# Patient Record
Sex: Female | Born: 1935 | Race: White | Hispanic: No | Smoking: Never smoker
Health system: Southern US, Community
[De-identification: ages and names within clinical notes are randomized; demographics above are authoritative.]

## PROBLEM LIST (undated history)

## (undated) DIAGNOSIS — D649 Anemia, unspecified: Secondary | ICD-10-CM

## (undated) DIAGNOSIS — I471 Supraventricular tachycardia, unspecified: Secondary | ICD-10-CM

## (undated) DIAGNOSIS — Z86010 Personal history of colonic polyps: Secondary | ICD-10-CM

## (undated) DIAGNOSIS — I2699 Other pulmonary embolism without acute cor pulmonale: Secondary | ICD-10-CM

## (undated) DIAGNOSIS — M1990 Unspecified osteoarthritis, unspecified site: Secondary | ICD-10-CM

## (undated) DIAGNOSIS — I272 Pulmonary hypertension, unspecified: Secondary | ICD-10-CM

## (undated) DIAGNOSIS — I50810 Right heart failure, unspecified: Secondary | ICD-10-CM

## (undated) DIAGNOSIS — I10 Essential (primary) hypertension: Secondary | ICD-10-CM

## (undated) DIAGNOSIS — M8580 Other specified disorders of bone density and structure, unspecified site: Secondary | ICD-10-CM

## (undated) DIAGNOSIS — K5790 Diverticulosis of intestine, part unspecified, without perforation or abscess without bleeding: Secondary | ICD-10-CM

## (undated) DIAGNOSIS — G4730 Sleep apnea, unspecified: Secondary | ICD-10-CM

## (undated) DIAGNOSIS — N3281 Overactive bladder: Secondary | ICD-10-CM

## (undated) DIAGNOSIS — E785 Hyperlipidemia, unspecified: Secondary | ICD-10-CM

## (undated) DIAGNOSIS — M4800 Spinal stenosis, site unspecified: Secondary | ICD-10-CM

## (undated) HISTORY — PX: COLONOSCOPY: SHX174

## (undated) HISTORY — DX: Supraventricular tachycardia: I47.1

## (undated) HISTORY — PX: INCISIONAL HERNIA REPAIR: SHX193

## (undated) HISTORY — DX: Other pulmonary embolism without acute cor pulmonale: I26.99

## (undated) HISTORY — DX: Hyperlipidemia, unspecified: E78.5

## (undated) HISTORY — DX: Essential (primary) hypertension: I10

## (undated) HISTORY — DX: Personal history of colonic polyps: Z86.010

## (undated) HISTORY — DX: Unspecified osteoarthritis, unspecified site: M19.90

## (undated) HISTORY — DX: Diverticulosis of intestine, part unspecified, without perforation or abscess without bleeding: K57.90

## (undated) HISTORY — DX: Anemia, unspecified: D64.9

## (undated) HISTORY — DX: Spinal stenosis, site unspecified: M48.00

## (undated) HISTORY — DX: Supraventricular tachycardia, unspecified: I47.10

## (undated) HISTORY — DX: Pulmonary hypertension, unspecified: I27.20

## (undated) HISTORY — DX: Right heart failure, unspecified: I50.810

## (undated) HISTORY — DX: Overactive bladder: N32.81

## (undated) HISTORY — DX: Other specified disorders of bone density and structure, unspecified site: M85.80

## (undated) HISTORY — PX: CHOLECYSTECTOMY: SHX55

## (undated) HISTORY — DX: Sleep apnea, unspecified: G47.30

## (undated) HISTORY — PX: TONSILLECTOMY AND ADENOIDECTOMY: SUR1326

---

## 1991-10-28 HISTORY — PX: BLADDER SUSPENSION: SHX72

## 1998-01-16 ENCOUNTER — Ambulatory Visit (HOSPITAL_COMMUNITY): Admission: RE | Admit: 1998-01-16 | Discharge: 1998-01-16 | Payer: Self-pay | Admitting: *Deleted

## 1998-10-27 HISTORY — PX: TOTAL KNEE ARTHROPLASTY: SHX125

## 1999-01-22 ENCOUNTER — Ambulatory Visit (HOSPITAL_COMMUNITY): Admission: RE | Admit: 1999-01-22 | Discharge: 1999-01-22 | Payer: Self-pay | Admitting: *Deleted

## 1999-04-15 ENCOUNTER — Other Ambulatory Visit: Admission: RE | Admit: 1999-04-15 | Discharge: 1999-04-15 | Payer: Self-pay | Admitting: *Deleted

## 1999-04-26 ENCOUNTER — Ambulatory Visit (HOSPITAL_COMMUNITY): Admission: RE | Admit: 1999-04-26 | Discharge: 1999-04-26 | Payer: Self-pay | Admitting: *Deleted

## 1999-10-24 ENCOUNTER — Inpatient Hospital Stay (HOSPITAL_COMMUNITY): Admission: RE | Admit: 1999-10-24 | Discharge: 1999-10-29 | Payer: Self-pay | Admitting: Orthopedic Surgery

## 1999-11-01 ENCOUNTER — Emergency Department (HOSPITAL_COMMUNITY): Admission: EM | Admit: 1999-11-01 | Discharge: 1999-11-01 | Payer: Self-pay | Admitting: *Deleted

## 2000-02-05 ENCOUNTER — Ambulatory Visit (HOSPITAL_COMMUNITY): Admission: RE | Admit: 2000-02-05 | Discharge: 2000-02-05 | Payer: Self-pay | Admitting: *Deleted

## 2000-04-09 ENCOUNTER — Other Ambulatory Visit: Admission: RE | Admit: 2000-04-09 | Discharge: 2000-04-09 | Payer: Self-pay | Admitting: Family Medicine

## 2000-08-20 ENCOUNTER — Ambulatory Visit (HOSPITAL_COMMUNITY): Admission: RE | Admit: 2000-08-20 | Discharge: 2000-08-20 | Payer: Self-pay | Admitting: Family Medicine

## 2001-02-24 ENCOUNTER — Ambulatory Visit (HOSPITAL_COMMUNITY): Admission: RE | Admit: 2001-02-24 | Discharge: 2001-02-24 | Payer: Self-pay | Admitting: Family Medicine

## 2001-02-24 ENCOUNTER — Encounter: Payer: Self-pay | Admitting: Family Medicine

## 2001-04-21 ENCOUNTER — Other Ambulatory Visit: Admission: RE | Admit: 2001-04-21 | Discharge: 2001-04-21 | Payer: Self-pay | Admitting: Family Medicine

## 2002-03-04 ENCOUNTER — Encounter: Payer: Self-pay | Admitting: Family Medicine

## 2002-03-04 ENCOUNTER — Ambulatory Visit (HOSPITAL_COMMUNITY): Admission: RE | Admit: 2002-03-04 | Discharge: 2002-03-04 | Payer: Self-pay | Admitting: Family Medicine

## 2002-05-23 ENCOUNTER — Other Ambulatory Visit: Admission: RE | Admit: 2002-05-23 | Discharge: 2002-05-23 | Payer: Self-pay | Admitting: Family Medicine

## 2003-03-28 ENCOUNTER — Ambulatory Visit (HOSPITAL_COMMUNITY): Admission: RE | Admit: 2003-03-28 | Discharge: 2003-03-28 | Payer: Self-pay | Admitting: Family Medicine

## 2003-03-28 ENCOUNTER — Encounter: Payer: Self-pay | Admitting: Family Medicine

## 2004-04-17 ENCOUNTER — Ambulatory Visit (HOSPITAL_COMMUNITY): Admission: RE | Admit: 2004-04-17 | Discharge: 2004-04-17 | Payer: Self-pay | Admitting: Family Medicine

## 2004-09-20 ENCOUNTER — Ambulatory Visit: Payer: Self-pay | Admitting: Internal Medicine

## 2004-09-23 ENCOUNTER — Ambulatory Visit: Payer: Self-pay | Admitting: Family Medicine

## 2004-10-15 ENCOUNTER — Ambulatory Visit: Payer: Self-pay | Admitting: Family Medicine

## 2005-04-18 ENCOUNTER — Ambulatory Visit (HOSPITAL_COMMUNITY): Admission: RE | Admit: 2005-04-18 | Discharge: 2005-04-18 | Payer: Self-pay | Admitting: Family Medicine

## 2005-05-27 LAB — HM DEXA SCAN

## 2005-05-28 ENCOUNTER — Ambulatory Visit: Payer: Self-pay | Admitting: Family Medicine

## 2005-05-28 ENCOUNTER — Other Ambulatory Visit: Admission: RE | Admit: 2005-05-28 | Discharge: 2005-05-28 | Payer: Self-pay | Admitting: Family Medicine

## 2005-05-28 LAB — CONVERTED CEMR LAB: Pap Smear: NORMAL

## 2005-06-04 ENCOUNTER — Ambulatory Visit: Payer: Self-pay | Admitting: Family Medicine

## 2005-07-31 ENCOUNTER — Ambulatory Visit: Payer: Self-pay | Admitting: Family Medicine

## 2005-08-14 ENCOUNTER — Ambulatory Visit: Payer: Self-pay | Admitting: Family Medicine

## 2006-02-26 ENCOUNTER — Ambulatory Visit: Payer: Self-pay | Admitting: Family Medicine

## 2006-03-04 ENCOUNTER — Ambulatory Visit: Payer: Self-pay | Admitting: Family Medicine

## 2006-03-07 ENCOUNTER — Ambulatory Visit: Payer: Self-pay | Admitting: Internal Medicine

## 2006-05-06 ENCOUNTER — Ambulatory Visit: Payer: Self-pay | Admitting: Family Medicine

## 2006-05-13 ENCOUNTER — Ambulatory Visit (HOSPITAL_COMMUNITY): Admission: RE | Admit: 2006-05-13 | Discharge: 2006-05-13 | Payer: Self-pay | Admitting: Family Medicine

## 2006-05-22 ENCOUNTER — Ambulatory Visit: Payer: Self-pay | Admitting: Family Medicine

## 2006-06-25 ENCOUNTER — Ambulatory Visit: Payer: Self-pay | Admitting: Family Medicine

## 2006-07-13 ENCOUNTER — Ambulatory Visit: Payer: Self-pay | Admitting: Family Medicine

## 2006-08-04 ENCOUNTER — Ambulatory Visit: Payer: Self-pay | Admitting: Family Medicine

## 2006-10-15 ENCOUNTER — Ambulatory Visit: Payer: Self-pay | Admitting: Family Medicine

## 2006-10-27 HISTORY — PX: CATARACT EXTRACTION: SUR2

## 2006-11-17 ENCOUNTER — Ambulatory Visit (HOSPITAL_COMMUNITY): Admission: RE | Admit: 2006-11-17 | Discharge: 2006-11-17 | Payer: Self-pay | Admitting: Orthopedic Surgery

## 2007-02-23 ENCOUNTER — Encounter: Payer: Self-pay | Admitting: Family Medicine

## 2007-02-23 DIAGNOSIS — M1990 Unspecified osteoarthritis, unspecified site: Secondary | ICD-10-CM | POA: Insufficient documentation

## 2007-02-23 DIAGNOSIS — M48061 Spinal stenosis, lumbar region without neurogenic claudication: Secondary | ICD-10-CM | POA: Insufficient documentation

## 2007-02-23 DIAGNOSIS — D72818 Other decreased white blood cell count: Secondary | ICD-10-CM | POA: Insufficient documentation

## 2007-02-23 DIAGNOSIS — Z8739 Personal history of other diseases of the musculoskeletal system and connective tissue: Secondary | ICD-10-CM

## 2007-02-23 DIAGNOSIS — I471 Supraventricular tachycardia: Secondary | ICD-10-CM

## 2007-02-23 DIAGNOSIS — M349 Systemic sclerosis, unspecified: Secondary | ICD-10-CM

## 2007-02-23 DIAGNOSIS — T7840XA Allergy, unspecified, initial encounter: Secondary | ICD-10-CM | POA: Insufficient documentation

## 2007-02-23 DIAGNOSIS — Q762 Congenital spondylolisthesis: Secondary | ICD-10-CM | POA: Insufficient documentation

## 2007-05-17 ENCOUNTER — Ambulatory Visit (HOSPITAL_COMMUNITY): Admission: RE | Admit: 2007-05-17 | Discharge: 2007-05-17 | Payer: Self-pay | Admitting: Family Medicine

## 2007-05-20 ENCOUNTER — Encounter (INDEPENDENT_AMBULATORY_CARE_PROVIDER_SITE_OTHER): Payer: Self-pay | Admitting: *Deleted

## 2007-07-09 ENCOUNTER — Ambulatory Visit: Payer: Self-pay | Admitting: Family Medicine

## 2007-07-09 DIAGNOSIS — E785 Hyperlipidemia, unspecified: Secondary | ICD-10-CM

## 2007-07-09 DIAGNOSIS — I10 Essential (primary) hypertension: Secondary | ICD-10-CM | POA: Insufficient documentation

## 2007-07-16 ENCOUNTER — Encounter (INDEPENDENT_AMBULATORY_CARE_PROVIDER_SITE_OTHER): Payer: Self-pay | Admitting: *Deleted

## 2007-07-16 ENCOUNTER — Ambulatory Visit: Payer: Self-pay | Admitting: Family Medicine

## 2007-07-16 LAB — FECAL OCCULT BLOOD, GUAIAC: Fecal Occult Blood: NEGATIVE

## 2007-07-28 ENCOUNTER — Ambulatory Visit: Payer: Self-pay | Admitting: Family Medicine

## 2007-11-01 ENCOUNTER — Ambulatory Visit: Payer: Self-pay | Admitting: Family Medicine

## 2007-11-29 ENCOUNTER — Ambulatory Visit: Payer: Self-pay | Admitting: Family Medicine

## 2007-12-08 ENCOUNTER — Telehealth: Payer: Self-pay | Admitting: Family Medicine

## 2007-12-09 ENCOUNTER — Encounter: Admission: RE | Admit: 2007-12-09 | Discharge: 2007-12-09 | Payer: Self-pay | Admitting: Family Medicine

## 2007-12-17 ENCOUNTER — Ambulatory Visit: Payer: Self-pay | Admitting: Family Medicine

## 2008-05-26 ENCOUNTER — Ambulatory Visit (HOSPITAL_COMMUNITY): Admission: RE | Admit: 2008-05-26 | Discharge: 2008-05-26 | Payer: Self-pay | Admitting: Family Medicine

## 2008-06-15 ENCOUNTER — Ambulatory Visit: Payer: Self-pay | Admitting: Family Medicine

## 2008-06-15 LAB — CONVERTED CEMR LAB
Ketones, urine, test strip: NEGATIVE
Nitrite: NEGATIVE
Protein, U semiquant: NEGATIVE
Urobilinogen, UA: 0.2

## 2008-07-14 ENCOUNTER — Encounter: Payer: Self-pay | Admitting: Family Medicine

## 2008-07-14 ENCOUNTER — Other Ambulatory Visit: Admission: RE | Admit: 2008-07-14 | Discharge: 2008-07-14 | Payer: Self-pay | Admitting: Family Medicine

## 2008-07-14 ENCOUNTER — Ambulatory Visit: Payer: Self-pay | Admitting: Family Medicine

## 2008-07-14 DIAGNOSIS — R350 Frequency of micturition: Secondary | ICD-10-CM

## 2008-07-17 LAB — CONVERTED CEMR LAB
Albumin: 3.8 g/dL (ref 3.5–5.2)
Alkaline Phosphatase: 57 units/L (ref 39–117)
BUN: 17 mg/dL (ref 6–23)
Bilirubin, Direct: 0.1 mg/dL (ref 0.0–0.3)
Chloride: 104 meq/L (ref 96–112)
Creatinine, Ser: 0.9 mg/dL (ref 0.4–1.2)
Eosinophils Relative: 3.5 % (ref 0.0–5.0)
GFR calc non Af Amer: 65 mL/min
Hemoglobin: 14.6 g/dL (ref 12.0–15.0)
LDL Cholesterol: 106 mg/dL — ABNORMAL HIGH (ref 0–99)
Lymphocytes Relative: 24.1 % (ref 12.0–46.0)
MCV: 92.5 fL (ref 78.0–100.0)
Monocytes Absolute: 0.5 10*3/uL (ref 0.1–1.0)
Monocytes Relative: 10.9 % (ref 3.0–12.0)
Neutro Abs: 2.6 10*3/uL (ref 1.4–7.7)
Phosphorus: 3.6 mg/dL (ref 2.3–4.6)
Platelets: 203 10*3/uL (ref 150–400)
Potassium: 3.8 meq/L (ref 3.5–5.1)
RDW: 12.3 % (ref 11.5–14.6)
Sodium: 143 meq/L (ref 135–145)
Total Bilirubin: 1.1 mg/dL (ref 0.3–1.2)
Total CHOL/HDL Ratio: 4.4
Total Protein: 7.4 g/dL (ref 6.0–8.3)

## 2008-07-18 ENCOUNTER — Encounter: Payer: Self-pay | Admitting: Family Medicine

## 2008-07-21 ENCOUNTER — Ambulatory Visit: Payer: Self-pay | Admitting: Family Medicine

## 2008-07-21 LAB — CONVERTED CEMR LAB
OCCULT 2: NEGATIVE
OCCULT 3: NEGATIVE

## 2008-08-30 ENCOUNTER — Ambulatory Visit: Payer: Self-pay | Admitting: Family Medicine

## 2008-09-04 ENCOUNTER — Telehealth: Payer: Self-pay | Admitting: Family Medicine

## 2008-09-08 ENCOUNTER — Telehealth: Payer: Self-pay | Admitting: Family Medicine

## 2008-09-29 ENCOUNTER — Ambulatory Visit: Payer: Self-pay | Admitting: Family Medicine

## 2008-09-29 DIAGNOSIS — R05 Cough: Secondary | ICD-10-CM

## 2009-03-16 ENCOUNTER — Ambulatory Visit: Payer: Self-pay | Admitting: Family Medicine

## 2009-03-21 ENCOUNTER — Telehealth: Payer: Self-pay | Admitting: Family Medicine

## 2009-04-02 ENCOUNTER — Telehealth: Payer: Self-pay | Admitting: Family Medicine

## 2009-04-03 ENCOUNTER — Ambulatory Visit: Payer: Self-pay | Admitting: Family Medicine

## 2009-05-28 ENCOUNTER — Ambulatory Visit (HOSPITAL_COMMUNITY): Admission: RE | Admit: 2009-05-28 | Discharge: 2009-05-28 | Payer: Self-pay | Admitting: Family Medicine

## 2009-05-30 ENCOUNTER — Encounter: Payer: Self-pay | Admitting: Family Medicine

## 2009-05-30 ENCOUNTER — Encounter (INDEPENDENT_AMBULATORY_CARE_PROVIDER_SITE_OTHER): Payer: Self-pay | Admitting: *Deleted

## 2009-07-16 ENCOUNTER — Ambulatory Visit: Payer: Self-pay | Admitting: Family Medicine

## 2009-07-17 LAB — CONVERTED CEMR LAB
ALT: 18 units/L (ref 0–35)
AST: 24 units/L (ref 0–37)
BUN: 19 mg/dL (ref 6–23)
Calcium: 9.1 mg/dL (ref 8.4–10.5)
Chloride: 103 meq/L (ref 96–112)
Creatinine, Ser: 0.9 mg/dL (ref 0.4–1.2)
Eosinophils Absolute: 0.2 10*3/uL (ref 0.0–0.7)
Glucose, Bld: 94 mg/dL (ref 70–99)
Lymphocytes Relative: 26.7 % (ref 12.0–46.0)
MCV: 92.9 fL (ref 78.0–100.0)
Monocytes Relative: 11.1 % (ref 3.0–12.0)
Neutro Abs: 2.9 10*3/uL (ref 1.4–7.7)
Neutrophils Relative %: 57.3 % (ref 43.0–77.0)
Phosphorus: 3.6 mg/dL (ref 2.3–4.6)
Platelets: 184 10*3/uL (ref 150.0–400.0)
RBC: 4.75 M/uL (ref 3.87–5.11)
RDW: 13 % (ref 11.5–14.6)
Total CHOL/HDL Ratio: 4
VLDL: 16.6 mg/dL (ref 0.0–40.0)

## 2009-07-26 ENCOUNTER — Ambulatory Visit: Payer: Self-pay | Admitting: Family Medicine

## 2009-08-15 ENCOUNTER — Ambulatory Visit: Payer: Self-pay | Admitting: Family Medicine

## 2009-08-15 DIAGNOSIS — E876 Hypokalemia: Secondary | ICD-10-CM | POA: Insufficient documentation

## 2009-08-16 ENCOUNTER — Encounter: Payer: Self-pay | Admitting: Family Medicine

## 2009-08-16 LAB — CONVERTED CEMR LAB: Potassium: 3.4 meq/L — ABNORMAL LOW (ref 3.5–5.1)

## 2009-08-31 ENCOUNTER — Ambulatory Visit: Payer: Self-pay | Admitting: Family Medicine

## 2009-09-03 LAB — CONVERTED CEMR LAB: Potassium: 3.8 meq/L (ref 3.5–5.1)

## 2009-10-23 ENCOUNTER — Ambulatory Visit: Payer: Self-pay | Admitting: Family Medicine

## 2009-10-23 LAB — CONVERTED CEMR LAB
Specific Gravity, Urine: 1.015
Urobilinogen, UA: 0.2
WBC Urine, dipstick: NEGATIVE

## 2009-10-31 ENCOUNTER — Telehealth: Payer: Self-pay | Admitting: Family Medicine

## 2009-11-02 ENCOUNTER — Encounter: Payer: Self-pay | Admitting: Family Medicine

## 2010-04-25 ENCOUNTER — Emergency Department (HOSPITAL_COMMUNITY): Admission: EM | Admit: 2010-04-25 | Discharge: 2010-04-25 | Payer: Self-pay | Admitting: Emergency Medicine

## 2010-05-29 ENCOUNTER — Ambulatory Visit (HOSPITAL_COMMUNITY): Admission: RE | Admit: 2010-05-29 | Discharge: 2010-05-29 | Payer: Self-pay | Admitting: Family Medicine

## 2010-05-29 LAB — HM MAMMOGRAPHY: HM Mammogram: NORMAL

## 2010-05-31 ENCOUNTER — Encounter: Payer: Self-pay | Admitting: Family Medicine

## 2010-07-16 ENCOUNTER — Ambulatory Visit: Payer: Self-pay | Admitting: Family Medicine

## 2010-08-12 ENCOUNTER — Ambulatory Visit: Payer: Self-pay | Admitting: Family Medicine

## 2010-08-12 ENCOUNTER — Telehealth (INDEPENDENT_AMBULATORY_CARE_PROVIDER_SITE_OTHER): Payer: Self-pay | Admitting: *Deleted

## 2010-08-12 LAB — CONVERTED CEMR LAB
ALT: 17 units/L (ref 0–35)
AST: 25 units/L (ref 0–37)
Basophils Relative: 0.7 % (ref 0.0–3.0)
CO2: 35 meq/L — ABNORMAL HIGH (ref 19–32)
Chloride: 103 meq/L (ref 96–112)
Cholesterol: 168 mg/dL (ref 0–200)
Creatinine, Ser: 0.9 mg/dL (ref 0.4–1.2)
GFR calc non Af Amer: 63.29 mL/min (ref >60)
Glucose, Bld: 96 mg/dL (ref 70–99)
HCT: 43 % (ref 36.0–46.0)
HDL: 46.4 mg/dL (ref >39.00)
Hemoglobin: 14.5 g/dL (ref 12.0–15.0)
MCHC: 33.8 g/dL (ref 30.0–36.0)
MCV: 93.9 fL (ref 78.0–100.0)
Neutro Abs: 2.1 10*3/uL (ref 1.4–7.7)
Neutrophils Relative %: 51.4 % (ref 43.0–77.0)
Platelets: 193 10*3/uL (ref 150.0–400.0)
RDW: 13.2 % (ref 11.5–14.6)
TSH: 3.35 microintl units/mL (ref 0.35–5.50)
Total Bilirubin: 0.9 mg/dL (ref 0.3–1.2)
Total CHOL/HDL Ratio: 4
Total Protein: 6.7 g/dL (ref 6.0–8.3)
Triglycerides: 107 mg/dL (ref 0.0–149.0)

## 2010-08-13 LAB — CONVERTED CEMR LAB: Vit D, 25-Hydroxy: 68 ng/mL (ref 30–89)

## 2010-08-15 ENCOUNTER — Ambulatory Visit: Payer: Self-pay | Admitting: Family Medicine

## 2010-08-15 DIAGNOSIS — M2140 Flat foot [pes planus] (acquired), unspecified foot: Secondary | ICD-10-CM | POA: Insufficient documentation

## 2010-08-19 ENCOUNTER — Telehealth: Payer: Self-pay | Admitting: Family Medicine

## 2010-08-20 ENCOUNTER — Encounter: Payer: Self-pay | Admitting: Family Medicine

## 2010-11-15 ENCOUNTER — Ambulatory Visit
Admission: RE | Admit: 2010-11-15 | Discharge: 2010-11-15 | Payer: Self-pay | Source: Home / Self Care | Attending: Family Medicine | Admitting: Family Medicine

## 2010-11-18 ENCOUNTER — Encounter: Payer: Self-pay | Admitting: Family Medicine

## 2010-11-24 LAB — CONVERTED CEMR LAB
ALT: 18 units/L (ref 0–35)
AST: 26 units/L (ref 0–37)
BUN: 16 mg/dL (ref 6–23)
Basophils Relative: 0.8 % (ref 0.0–1.0)
Calcium: 9.1 mg/dL (ref 8.4–10.5)
Chloride: 107 meq/L (ref 96–112)
Creatinine, Ser: 0.9 mg/dL (ref 0.4–1.2)
GFR calc Af Amer: 79 mL/min
GFR calc non Af Amer: 66 mL/min
Glucose, Bld: 95 mg/dL (ref 70–99)
HCT: 40.6 % (ref 36.0–46.0)
Hemoglobin: 14.2 g/dL (ref 12.0–15.0)
Lymphocytes Relative: 30.7 % (ref 12.0–46.0)
MCHC: 35 g/dL (ref 30.0–36.0)
Monocytes Relative: 12.2 % — ABNORMAL HIGH (ref 3.0–11.0)
Sodium: 145 meq/L (ref 135–145)
TSH: 3.36 microintl units/mL (ref 0.35–5.50)
Total CHOL/HDL Ratio: 5.1
Triglycerides: 151 mg/dL — ABNORMAL HIGH (ref 0–149)
VLDL: 30 mg/dL (ref 0–40)

## 2010-11-26 NOTE — Letter (Signed)
Summary: Results Follow up Letter  [place] [facility]  [address] [place], [place] [address]   Phone: [phone]  Fax: [phone]    05/31/2010 MRN: [id number]  [facility] [address] [place], [place]  [address]  Dear Ms. Busic,  The following are the results of your recent test(s):  Test         Result    Pap Smear:        Normal _____  Not Normal _____ Comments: ______________________________________________________ Cholesterol: LDL(Bad cholesterol):         Your goal is less than:         HDL (Good cholesterol):       Your goal is more than: Comments:  ______________________________________________________ Mammogram:        Normal __X___  Not Normal _____ Comments: Please repeat in one year.  ___________________________________________________________________ Hemoccult:        Normal _____  Not normal _______ Comments:    _____________________________________________________________________ Other Tests:    We routinely do not discuss normal results over the telephone.  If you desire a copy of the results, or you have any questions about this information we can discuss them at your next office visit.   Sincerely,      [name], MD

## 2010-11-26 NOTE — Letter (Signed)
Summary: [facility]   Imported By: Lanelle Bal 11/12/2009 10:59:24  _____________________________________________________________________  External Attachment:    Type:   Image     Comment:   External Document

## 2010-11-26 NOTE — Progress Notes (Signed)
----   Converted from flag ---- ---- 08/12/2010 8:44 AM, Colon Flattery Tower MD wrote: please check renal , lipid/ hepatic, cbc with diff/ tsh and vit D level for 401.1, 272 and 733.0 thanks  ---- 08/09/2010 3:20 PM, Melody Comas wrote: Patient is coming in for cpx labs on monday. What labs to draw and diagnosis please. ------------------------------

## 2010-11-26 NOTE — Progress Notes (Signed)
----   Converted from flag ---- ---- 08/07/2010 8:32 PM, Colon Flattery Tower MD wrote: please check lipid/ renal / hepatic/ tsh / cbc with diff and vit D for 272, 401.1, 733.0  ---- 08/07/2010 11:59 AM, Liane Comber CMA (AAMA) wrote: Lab orders please! Good Morning! This pt is scheduled for cpx labs Monday, which labs to draw and dx codes to use? Thanks Tasha ------------------------------

## 2010-11-26 NOTE — Progress Notes (Signed)
Summary: Pneumovax removed from 08/15/10 visit.  ---- Converted from flag ---- ---- 08/19/2010 12:11 PM, Crawford Givens McClinton wrote: [name], the pneumovax h/b removed.  Last Thursday's chrgs just showed today.  Ruby  ---- 08/15/2010 2:50 PM, [name] LPN wrote: [name], MRN# [id number] did not receive a pneumovax today. Please remove from pt's record. Please let me know when this is done and thank you very much. [name] LPN ------------------------------

## 2010-11-26 NOTE — Progress Notes (Signed)
Summary: Rx Detrol, Atenolol, Klor-Con, & HCTZ ([facility])  Phone Note Refill Request Call back at [phone] Message from:  [facility] on October 31, 2009 9:23 AM  Refills Requested: Medication #1:  DETROL LA 4 MG XR24H-CAP 1 by mouth once daily  Medication #2:  ATENOLOL 50 MG TABS Take one by mouth daily  Medication #3:  K DUR 10 MEQ One by mouth once daily  Medication #4:  HYDROCHLOROTHIAZIDE 25 MG TABS Take one by mouth daily Received fax for 90 day Rx for all medications.  Forms in your IN box   Method Requested: Fax to Mail Away Pharmacy Initial call taken by: Linde Gillis CMA Duncan Dull),  October 31, 2009 9:24 AM  Follow-up for Phone Call        printed in put in nurse in box for pickup  Follow-up by: Judith Part MD,  October 31, 2009 11:06 AM  Additional Follow-up for Phone Call Additional follow up Details #1::        Scripts faxed to [facility] Additional Follow-up by: Lowella Petties CMA,  October 31, 2009 11:24 AM    Prescriptions: K DUR 10 MEQ One by mouth once daily  #90 x 3   Entered by:   Lowella Petties CMA   Authorized by:   Judith Part MD   Signed by:   Lowella Petties CMA on 10/31/2009   Method used:   Telephoned to ...       [facility]* (mail-order)             ,          Ph: [phone]       Fax: [phone]   RxID:   [id number] DETROL LA 4 MG XR24H-CAP (TOLTERODINE TARTRATE) 1 by mouth once daily  #90 x 3   Entered by:   Lowella Petties CMA   Authorized by:   Judith Part MD   Signed by:   Lowella Petties CMA on 10/31/2009   Method used:   Electronically to        [facility]* (mail-order)             ,          Ph: [phone]       Fax: [phone]   RxID:   [id number] ATENOLOL 50 MG TABS (ATENOLOL) Take one by mouth daily  #90 x 3   Entered by:   Lowella Petties CMA   Authorized by:   Judith Part MD   Signed by:   Lowella Petties CMA on 10/31/2009   Method used:   Electronically to        [facility]*  (mail-order)             ,          Ph: [phone]       Fax: [phone]   RxID:   [id number] HYDROCHLOROTHIAZIDE 25 MG TABS (HYDROCHLOROTHIAZIDE) Take one by mouth daily  #90 x 3   Entered by:   Lowella Petties CMA   Authorized by:   Judith Part MD   Signed by:   Lowella Petties CMA on 10/31/2009   Method used:   Electronically to        [facility]* (mail-order)             ,          Ph: [phone]       Fax: [phone]   RxID:  [id number] DETROL LA 4 MG XR24H-CAP (TOLTERODINE TARTRATE) 1 by mouth once daily  #90 x 3   Entered and Authorized by:   Judith Part MD   Signed by:   Judith Part MD on 10/31/2009   Method used:   Print then Give to Patient   RxID:   [id number] K DUR 10 MEQ One by mouth once daily  #90 x 3   Entered and Authorized by:   Judith Part MD   Signed by:   Judith Part MD on 10/31/2009   Method used:   Print then Give to Patient   RxID:   [id number] HYDROCHLOROTHIAZIDE 25 MG TABS (HYDROCHLOROTHIAZIDE) Take one by mouth daily  #90 x 3   Entered and Authorized by:   Judith Part MD   Signed by:   Judith Part MD on 10/31/2009   Method used:   Print then Give to Patient   RxID:   [id number] K DUR 10 MEQ One by mouth once daily  #90 x 3   Entered and Authorized by:   Judith Part MD   Signed by:   Judith Part MD on 10/31/2009   Method used:   Historical   RxID:   [id number] ATENOLOL 50 MG TABS (ATENOLOL) Take one by mouth daily  #90 x 3   Entered and Authorized by:   Judith Part MD   Signed by:   Judith Part MD on 10/31/2009   Method used:   Print then Give to Patient   RxID:   [id number] DETROL LA 4 MG XR24H-CAP (TOLTERODINE TARTRATE) 1 by mouth once daily  #90 x 3   Entered and Authorized by:   Judith Part MD   Signed by:   Judith Part MD on 10/31/2009   Method used:   Historical   RxID:   [id number]
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
## 2010-11-26 NOTE — Assessment & Plan Note (Signed)
Summary: FLU SHOT/TOWER/JRR   Nurse Visit   Allergies: 1)  ! Penicillin G Potassium (Penicillin G Potassium) 2)  ! Sulfa 3)  ! Ace Inhibitors 4)  ! Diphtheria-Tetanus Toxoids (Diphtheria-Tetanus Toxoids Dt)  Immunizations Administered:  Influenza Vaccine # 1:    Vaccine Type: Fluvax 3+    Site: left deltoid    Mfr: GlaxoSmithKline    Dose: 0.5 ml    Route: IM    Given by: Mervin Hack CMA (AAMA)    Exp. Date: 04/21/2011    Lot #: [id number]    VIS given: 05/21/10 version given July 16, 2010.  Flu Vaccine Consent Questions:    Do you have a history of severe allergic reactions to this vaccine? no    Any prior history of allergic reactions to egg and/or gelatin? no    Do you have a sensitivity to the preservative Thimersol? no    Do you have a past history of Guillan-Barre Syndrome? no    Do you currently have an acute febrile illness? no    Have you ever had a severe reaction to latex? no    Vaccine information given and explained to patient? yes    Are you currently pregnant? no  Orders Added: 1)  Flu Vaccine 55yrs + [90658] 2)  Admin 1st Vaccine [09811]
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
## 2010-12-12 NOTE — Letter (Signed)
Summary: [facility]  [facility]   Imported By: Kassie Mends 12/02/2010 08:21:42  _____________________________________________________________________  External Attachment:    Type:   Image     Comment:   External Document

## 2011-03-14 NOTE — Procedures (Signed)
. [facility]  Patient:    [name]                         MRN: [id number] Proc. Date: 10/24/99 Adm. Date:  [id number] Attending:  Drema Pry CC:         Jearld Adjutant, M.D.             Department of Anesthesia                           Procedure Report  PROCEDURE:  SURGEON:  Edwin Cap. Zoila Shutter, M.D.  ANESTHESIA:  HISTORY:  The patient is a 77 year old white female with a diagnosis of severe degenerative joint disease, scheduled for a total knee replacement, and for whom, epidural analgesia in the postoperative period has been requested, explained, understood, and accepted in the preoperative period.  DESCRIPTION OF PROCEDURE:  At termination of surgery while still under general anesthesia, the patient was turned to the right lateral decubitus position. The back was prepped with Betadine and draped in the usual sterile fashion.  Using he loss of resistance technique, _________ tap was accomplished at the L3-4 interspace with a 17-gauge Tuohy needle.  After aspiration for blood and CSF were negative, a total of 7 cc of 1% lidocaine plus 100 mcg of fentanyl was infused with no problems.  This was followed the passage of a peridural catheter at 10 cm cephalad.  The catheter was then checked for patency and affixed to the patients back.  The patient was then returned to the supine position, awakened, and brought to he postanesthesia care unit where a peridural catheter will be connected to an infusion pump with a mixture of fentanyl 5 mcg/cc and Marcaine 1/16% at an initial rate of 10 cc/hr, to be adjusted as needed.  There were no complications.  She id well and will be followed in the usual fashion. DD:  10/24/99 TD:  10/25/99 Job: [id number]

## 2011-03-14 NOTE — Assessment & Plan Note (Signed)
[facility]                                   ON-CALL NOTE   NAME:[name], [name]                          MRN:          [id number]  DATE:05/05/2006                            DOB:          04-Mar-1934    TELEPHONE TRIAGE NOTE   Time received is 9:19 p.m.  The caller is her daughter.  She sees Dr. Milinda Antis.  Date of birth is 12-30-1933.  Telephone [phone].  The patient just  got back yesterday from a trip to the [place] and did well;  however, earlier today she has developed diffuse muscle aches, a headache  and fever.  There are no other symptoms except for a mild dry cough.  There  is no sore throat, no nausea, vomiting or diarrhea.  She is taking plenty of  fluids.  The patient took one Aleve tablet, 2-1/2 hours ago and another  Aleve tablet one hour ago; however, there has been no improvement and her  fever has steadily gone up to 102 degrees.  My response is go ahead and give  her two Tylenols now.  She should continue to drinking fluids.  If she seems  to stabilize tonight, she should be seen in [facility] office in the morning  for further evaluation.  However, if she gets worse tonight or her fever  continues, she should be seen in the emergency room tonight.                                   Tera Mater. Clent Ridges, MD   SAF/MedQ  DD:  05/05/2006  DT:  05/06/2006  Job #:  [id number]   cc:   Marne A. Milinda Antis, MD

## 2011-03-14 NOTE — Op Note (Signed)
[place]  Patient:    [name]                         MRN: [id number] Proc. Date: 10/24/99 Adm. Date:  [id number] Attending:  Drema [name] CC:         [name], M.D.             [name], M.D.             [name], M.D.                           Operative Report  PREOPERATIVE DIAGNOSES: 1. Left knee degenerative joint disease, end-stage. 2. Left thumb triggering with A1 pulley stenosis and tenosynovitis.  POSTOPERATIVE DIAGNOSES: 1. Left knee degenerative joint disease, end-stage. 2. Left thumb triggering with A1 pulley stenosis and tenosynovitis.  PROCEDURE: 1. Left total knee arthroplasty using cemented DePuy components with rotating    platform. 2. Release trigger thumb left, release A1 pulley left thumb.  SURGEON:  [name], M.D.  ASSISTANT:  [name]. [name], P.A.C.  ANESTHESIA:  General endotracheal.  CULTURES:  None.  DRAINS:  Two medium Hemovacs to Autovac.  ESTIMATED BLOOD LOSS:  Less than 100 cc.  Replaced - without.  TOURNIQUET TIME:  On left knee was 1 hour, 4 minutes.  On the left thumb was 14 minutes.  BLOOD LOSS:  Thumb estimate was minimal.  PATHOLOGIC FINDINGS/HISTORY:  [name] first presented to [place] February 23, 1999 as a 77 year old female with a long history of left knee pain, catching left knee pain. She also had a trigger thumb.  Examination was consistent with both.  X-rays showed basically bone-on-bone in the medial compartments particularly on the standing view.  We injected the knee and the thumb A1 pulley area with cortisone/Marcaine. She came back Mar 27, 1999.  She was having improvement with her injection Feb 25, 1999 and her thumb was much improved.  She was still tender over the medial compartment.  We felt she probably had a degenerative medial meniscus tear. She came back in April 03, 1999.  The knee was still hurting with sharp pain when she  twists mostly  medially and she was already considering arthroscopy due to the persistent discomfort.  Did not want to go totally to a knee replacement.  On May 02, 1999, we took her to the operating room, where she had a significant posterior horn medial meniscus tear that we saucerized back to a stable rim. She had lateral degenerative changes with meniscus tearing.  She had DJD grade III o IV in the medial femoral condyle, lateral femoral condyle, medial tibial plateau and lateral tibial plateau and posterior patella.  We did abrasion chondroplasty, synovectomies, medial and lateral meniscectomies, but felt she would come to total knee arthroplasty.  Postoperatively, she had an effusion.  We aspirated her, sent her to physical therapy.  She continued to have straw colored, nonpurulent aspirate.  We injected cortisone to turn the synovium off.  On May 24, 1999, she was four weeks postoperative with occasional feeling of giving away, was making  some progress.  By June 17, 1999, she was doing excellently seven weeks out. By July 15, 1999, her knee was painful and swollen, having a fair amount of pain. We reinjected the knee with cortisone/Marcaine.  By August 14, 1999, she is 3-1/2 months postoperative, still  painful with swelling, pain with every step.  Her range of motion was -5 to 110, moderate effusion.  X-rays basically showed bone-on-bone in the medial compartment as before and she decided she was not making progress. We talked about how again she did not want to do it, so we went ahead with a total knee arthroplasty.  We did not feel Hyalgan would be very helpful since she was  bone-on-bone.  She decided to proceed with total knee arthroplasty at this point. In addition, when she came back recently, her trigger thumb had recurred.  She id not want a cortisone shot.  We discussed various options and she decided to proceed with trigger thumb release at the time of surgery  today.  At surgery, she had grade III DJD throughout the knee.  We achieved ligament balance, full extension with  flexion to about 115 with a 15 mm insert.  We used a standard femur, a large tibia, a 15 mm rotating platform and a standard patella.  We slightly medialized the patella.  We did a lateral release and a ______  advancement with good tracking at closure.  Again, she did have full range of motion after closure.  A lateral release was done through the retinaculum, but leaving the synovium.  She was stable to varus/valgus stressing and full extension and flexion.  We did a partial synovectomy.  There were no untoward features with a thickened tenosynovium at he A1 pulley.  We released it and resected the radial half of the A1 pulley.  Care was taken to protect and preserve the neurovascular bundles.  She pinked up nicely t tourniquet letdown.  PROCEDURE:  With adequate anesthesia obtained, 3 mg of Cleocin was given IV prophylaxis.  The patient was placed in the supine position and the left lower extremity was prepped from the toes to the tourniquet in the standard fashion, s was the left hand fingertips to tourniquet.  We then prepped and draped in a standard fashion and kept the arm covered until we finished the knee.  After standard prepping and draping of the knee, Esmarch exsanguination was used and he tourniquet was let up to 350 mmHg.  A median parapatellar skin incision was then followed by a median parapatellar retinacular incision.  The incision was deepened throughout with the knife.  Hemostasis obtained using the Bovie electrocoagulator. We then dissected soft tissues and menisci off the proximal tibia, everted the patella and removed the fat pad.  Menisci were further removed, as well as, the  cruciates.  We flexed the knee up with the patella everted, amputated the tibial spines and then placed the gold-tip drill down the canal.  The tibial  cutting jig was then put in place intramedullary with a cut just below the medial osteophyte.  Cut was made and freshened.  We then sized to a standard femur, being somewhat between a standard and standard plus.  We then placed the cutting jig in place,  rotated appropriately with a 17.5 mm spacer on the C-clamp, which generally cuts down to a 15 and it did.  Flexion gap was therefore 15.  We then placed anterior/posterior cutting jig in place with the appropriate rotation, pinned it, made the anterior/posterior cuts without notching.  We then placed the 15 mm lollypop in and that was our flexion gap.  We then placed 4 degree valgus distal femoral cutting jib, cut at the neutral position first, then was a little tight, so cut 2.5 mm more after  testing with a 12.4 in extension and then she fit a 15 in  full extension.  We then placed the anterior/posterior Chamfer cutting jig in place, made those cuts, as well as, the far posterior cuts.  We then exposed the proximal tibia, sized to a large, set the template on, pinned it and then placed the peg hole driller down the canal and drilled in reverse the peg hole.  We then placed a large tibial tray.  A 15 mm rotating platform, standard to large.  We hen impacted on the standard femoral component and articulated the knee with range f motion as listed.  We then calipered the patella width to a 22, set the cutter n 14, made the cut and it cut down to 13, which was our desired thickness.  We then placed the 3-peg drill hole guide in place, made those holes and then placed the trial on.  We then slightly enlarged those holes.  We felt she was tracking a little lateral, so did a lateral retinacular release.  All trial components were then removed.  The joint was thoroughly jet lavaged while we checked the components coming on the field for size accuracy.  We then mixed cement, two batches of cement with Zinacef using the DePuy  cement.  Cement gun was used.  We then exposed the  proximal tibia and cemented on the tibial component in the appropriate rotation, impacted it and removed excess cement.  I then put the 15 mm rotating platform n place, standard large.  I then impacted the cemented femoral component on, impacted it, removed excess cement, then hyperextended the knee, removed excess cement and then held it at about 35 degrees while the cement cured.  I then cemented on the patellar component, impacted it, removed excess cement and held it with a clamp. When the cement had hardened, the tourniquet was let down, bleeding points were  cauterized.  We then placed Hemovac drains in the medial/lateral gutter and brought out the superolateral portal.  The wound was then closed in layers with #1 Vicryl on the retinaculum with a ______ advancement.  We did close the small synovial thinned area  distally on the lateral retinacular release, but left the lateral  retinacular release.  When the retinacular closure was complete, we ranged the nee through full extension with flexion to about 110-115 degrees.  The knee was very stable in full extension and flexion.  The subcutaneous tissue was then closed ith a  2-0 and 3-0 Vicryl.  The skin was closed with staples.  A bulky sterile compressive dressing was applied.  Hemovacs were hooked up to autovac.  Ace wrap placed.  We then turned attention to the left upper extremity, where Esmarch exsanguination was used from the fingertips to the tourniquet and 250 mmHg used on the tourniquet. I then made a transverse incision in longer skin lines at the base of the thumb.  I carefully dissected down to the A1 pulley, carefully retracting and protecting he neurovascular bundles on either sides.  I then split the A1 pulley longitudinally over a short segment with the knife.  Then, I brought scissors in, split it proximal and distal and removed the radial half.   The tendon was then felt to glide freely.  Wound was then closed with interrupted 4-0 nylon.  A bulky, sterile, compressive dressing was applied with Ace thumb spica, leaving the thumb tip out and allowing for some motion.  The tourniquet was let down.  The  patient then having tolerated the procedure well, was placed in a knee immobilizer on the knee, had a postoperative epidural fentanyl catheter placed by Dr. Zoila Shutter.  She was then taken to the recovery room in satisfactory condition for routine postoperative care, CPM and early physical therapy. DD:  10/24/99 TD:  10/24/99 Job: [id number]

## 2011-03-14 NOTE — Discharge Summary (Signed)
[place]  Patient:    [name], [name]                       MRN: [id number] Disc. Date: 11/29/99 Attending:  Jearld Adjutant, M.D. Dictator:   Currie Paris. Thedore Mins. CC:         [name]. [name], M.D.             [name], M.D.                           Discharge Summary  ADMISSION DIAGNOSES:  1. Degenerative joint disease, left knee.  2. Left trigger thumb.  3. Hypertension.  DISCHARGE DIAGNOSES:  1. Degenerative joint disease, left knee.  2. Left trigger thumb.  3. Hypertension.  PROCEDURES IN HOSPITAL:  1. Left total knee arthroplasty using Depuy LCS cemented components with rotation     platform.  2. Left trigger thumb release at A1 pulley.  BRIEF HISTORY:  [name] is a 77 year old white female who has a long history f left knee pain.  She has a history of a knee arthroscopy in July 2000 where she  underwent debridement of marked DJD of the left knee.  She continued despite exhaustive conservative treatment and arthroscopic debridement to have pain in er knee at night and pain with ambulation, with swelling.  Standing x-rays of the eft knee showed bone-on-bone in the medial compartment, and she did not respond to conservative treatment.  Therefore was admitted for a left total knee arthroplasty. It is also of note that she had a several-month history of a left trigger thumb  which, while under anesthesia, we will release.  PERTINENT LABORATORY STUDIES:  Preoperative chest x-ray showed a prominent ascending aorta with no acute disease.  Hemoglobin on admission was 12.5, hematocrit 37.1, WBC 4.6.  On postop day #1 her hemoglobin was 9.8.  Postop day #2 her hemoglobin was 7.1.  Postop day #3 her hemoglobin was 9.5.  Protime was 13.8 seconds on admission with an INR of 1.1. PTT 28.  On the date of discharge her protime was 19.3 seconds with an INR of 2.1 on Coumadin antithrombolytic therapy.  CMET on admission was within  normal limits.  Urinalysis on admission showed no abnormalities.  Preoperative urine culture showed no growth one day.  HOSPITAL COURSE:  The patient underwent left total knee arthroplasty and a left  trigger thumb release on October 24, 1999, as well described in Dr. Philipp [name] operative note.  Postoperatively the patient had a lumbar epidural fentanyl catheter for pain control and was placed on Coumadin antithrombolytic therapy x 1 month postop for DVT prophylaxis.  She was put on Cleocin 300 mg IV q.8h. x 6 doses and a CPM machine was used for the left knee full range of motion.  On postop day #1 she had some drowsiness.  She was taking fluids without difficulty.  She had  good Foley output.  Her T-max was 102.7 the first night, then she was found to e afebrile.  Her blood pressure was 82/48, pulse 93, and respirations 18.  The patient was basically asymptomatic.  The hemoglobin was 9.8.  INR was 1.5.  Her  thumb and knee dressings were benign.  Her epidural rate was decreased due to some sedation.  Her Foley catheter was continued.  On postop day #2 the epidural was  removed.  Dressing was changed.  Hemovac drains  were pulled and she was switched over to p.o. analgesics.  Vital signs were stable and she was afebrile.  She continued to progress with physical therapy.  On postop day #3 her hemoglobin was 9.5.  Her wound was benign.  She had a mild effusion.  Calf was soft without sign of DVT.  Her left trigger thumb dressing was also changed and that wound was benign.  She continued to make good progress with physical therapy, but it was somewhat slowed by the fact that she had had a trigger thumb release.  On postop day #5, November 29, 1999, the patient was ready to go home.  She was doing well. The pain was decreasing in both the thumb and the knee.  Her temperature was 99.6. Her vital signs were stable.  Her left knee wound was benign.  Range of motion as minus 5 to 90  degrees.  The left thumb wound was benign without triggering. The calves were soft.  Her INR was was 2.1.  CONDITION ON DISCHARGE:  She was discharged home in improved condition.  DIET:  Regular.  DISCHARGE INSTRUCTIONS:  Will be weightbearing as tolerated with a walker on the left.  Will use home CPM 6-8 hours a day.  Continue on Coumadin x 1 month postop. Was given Robaxin 750 mg 1 t.i.d. p.r.n. for spasm in her shoulder.  Lorcet Plus p.r.n. for pain.  Home Health physical therapy was set up.  FOLLOWUP:  She will follow up in our office in two weeks. DD:  12/03/99 TD:  12/03/99 Job: [id number]

## 2011-05-15 ENCOUNTER — Other Ambulatory Visit: Payer: Self-pay | Admitting: Family Medicine

## 2011-05-16 NOTE — Telephone Encounter (Signed)
[facility] electronically sent refill request for Detrol La 4mg  24 hr cap. Take 1 capsule once daily  Requested #90. Pt seen 11/15/10.Please advise.

## 2011-05-18 NOTE — Telephone Encounter (Signed)
Will do that electronically

## 2011-05-28 ENCOUNTER — Other Ambulatory Visit: Payer: Self-pay | Admitting: Family Medicine

## 2011-05-28 NOTE — Telephone Encounter (Signed)
[facility] electronically request refill for atenolol 50mg  #90 x1 and HCTZ 25mg  #90 x1. Pt already has CPX scheduled with Dr Milinda Antis on 08/20/11.

## 2011-06-03 ENCOUNTER — Other Ambulatory Visit: Payer: Self-pay | Admitting: Family Medicine

## 2011-06-03 DIAGNOSIS — Z1231 Encounter for screening mammogram for malignant neoplasm of breast: Secondary | ICD-10-CM

## 2011-06-17 ENCOUNTER — Ambulatory Visit (HOSPITAL_COMMUNITY)
Admission: RE | Admit: 2011-06-17 | Discharge: 2011-06-17 | Disposition: A | Discharge status: Home / Self Care | Payer: Medicare Other | Source: Ambulatory Visit | Attending: Family Medicine | Admitting: Family Medicine

## 2011-06-17 DIAGNOSIS — Z1231 Encounter for screening mammogram for malignant neoplasm of breast: Secondary | ICD-10-CM

## 2011-06-19 ENCOUNTER — Encounter: Payer: Self-pay | Admitting: *Deleted

## 2011-06-23 ENCOUNTER — Encounter: Payer: Self-pay | Admitting: Family Medicine

## 2011-06-24 ENCOUNTER — Encounter: Payer: Self-pay | Admitting: Family Medicine

## 2011-06-24 ENCOUNTER — Ambulatory Visit (INDEPENDENT_AMBULATORY_CARE_PROVIDER_SITE_OTHER): Payer: 59 | Admitting: Family Medicine

## 2011-06-24 VITALS — BP 110/76 | HR 68 | Temp 98.7°F | Ht 66.0 in | Wt 198.8 lb

## 2011-06-24 DIAGNOSIS — R350 Frequency of micturition: Secondary | ICD-10-CM

## 2011-06-24 DIAGNOSIS — L731 Pseudofolliculitis barbae: Secondary | ICD-10-CM

## 2011-06-24 DIAGNOSIS — L678 Other hair color and hair shaft abnormalities: Secondary | ICD-10-CM

## 2011-06-24 DIAGNOSIS — L738 Other specified follicular disorders: Secondary | ICD-10-CM

## 2011-06-24 LAB — POCT URINALYSIS DIPSTICK
Bilirubin, UA: NEGATIVE
Glucose, UA: NEGATIVE
Nitrite, UA: NEGATIVE
Urobilinogen, UA: 0.2

## 2011-06-24 MED ORDER — DOXYCYCLINE HYCLATE 100 MG PO TABS: 100.0000 mg | ORAL_TABLET | Freq: Two times a day (BID) | ORAL | Status: AC

## 2011-06-24 NOTE — Patient Instructions (Signed)
Use warm compress on papule on buttock area  Take the doxycycline as directed - I sent to [facility]  Drink lots of water  I will culture your urine and call you with result  If your symptoms worsen please let me know

## 2011-06-24 NOTE — Progress Notes (Signed)
Subjective:    Patient ID: [name], female    DOB: Dec 20, 1933, 77 y.o.   MRN: [id number]  HPI Started some urinary symptoms on Saturday - frequency and discomfort over her bladder -- a heaviness over bladder No burning  Felt bad with some chills - but no fever on thermometer No new back pain  Stayed in bed all weekend  Some better today  No blood in urine - but urine was more concentrated so she drank more fluids  Found area in buttock area - thinks it is a boil- found it in the shower this am  Not painful  Has not had one in a long time  Daughter looked at it and said it had a head on it  Not actively draining   Patient Active Problem List  Diagnoses  . HYPERCHOLESTEROLEMIA, PURE  . HYPOKALEMIA  . DECREASED WHITE BLOOD CELL COUNT NOS  . HYPERTENSION, ESSENTIAL NOS  . PSVT  . SCLERODERMA  . DEGENERATIVE JOINT DISEASE  . SPINAL STENOSIS, LUMBAR  . OSTEOPOROSIS  . OSTEOPENIA  . PES PLANUS  . SPONDYLOLISTHESIS  . COUGH, CHRONIC  . URINARY FREQUENCY, CHRONIC  . ALLERGY  . Ingrown hair   Past Medical History  Diagnosis Date  . Osteopenia   . Hyperlipidemia   . Hypertension   . PSVT (paroxysmal supraventricular tachycardia)   . Spinal stenosis   . Overactive bladder     urge incontinence   Past Surgical History  Procedure Date  . Joint replacement     left TKR  . Cataract extraction 2008  . Bladder suspension 1993   History  Substance Use Topics  . Smoking status: Never Smoker   . Smokeless tobacco: Not on file  . Alcohol Use: No   Family History  Problem Relation Age of Onset  . Cancer Mother     breast CA  . Heart disease Father     MI  . Diabetes Father   . Hypertension Father   . Cancer Sister     uterine CA   Allergies  Allergen Reactions  . Ace Inhibitors     REACTION: cough  . Penicillins     REACTION: hives  . Sulfonamide Derivatives   . Tenivac    Current Outpatient Prescriptions on File Prior to Visit  Medication Sig Dispense  Refill  . aspirin 325 MG tablet Take 325 mg by mouth daily.        Marland Kitchen atenolol (TENORMIN) 50 MG tablet TAKE 1 TABLET DAILY  90 tablet  1  . Calcium Carbonate-Vitamin D (CALCIUM-VITAMIN D) 600-200 MG-UNIT CAPS Take 1 capsule by mouth 3 (three) times daily.        Marland Kitchen DETROL LA 4 MG 24 hr capsule TAKE 1 CAPSULE ONCE DAILY  90 capsule  3  . diphenhydrAMINE (BENADRYL) 25 MG tablet Take 25 mg by mouth every morning.        . hydrochlorothiazide 25 MG tablet TAKE 1 TABLET DAILY  90 tablet  1  . Multiple Vitamins-Iron (MULTIVITAMIN/IRON PO) Take by mouth as directed.        . potassium chloride (K-DUR) 10 MEQ tablet Take 10 mEq by mouth daily.        Marland Kitchen albuterol (PROAIR HFA) 108 (90 BASE) MCG/ACT inhaler Inhale 2 puffs into the lungs every 4 (four) hours as needed.              Review of Systems Review of Systems  Constitutional: Negative for fever, appetite change, fatigue  and unexpected weight change.  Eyes: Negative for pain and visual disturbance.  Respiratory: Negative for cough and shortness of breath.   Cardiovascular: Negative.  for cp or palpitations Gastrointestinal: Negative for nausea, diarrhea and constipation.  Genitourinary: pos for urgency and frequency. , no hematuria or new back pain  Skin: Negative for pallor. or rash  Neurological: Negative for weakness, light-headedness, numbness and headaches.  Hematological: Negative for adenopathy. Does not bruise/bleed easily.  Psychiatric/Behavioral: Negative for dysphoric mood. The patient is not nervous/anxious.          Objective:   Physical Exam  Constitutional: She appears well-developed and well-nourished. No distress.       overwt and well appearing   HENT:  Head: Normocephalic and atraumatic.  Mouth/Throat: Oropharynx is clear and moist.  Eyes: Conjunctivae and EOM are normal. Pupils are equal, round, and reactive to light.  Neck: Normal range of motion. Neck supple. No JVD present. Carotid bruit is not present. No  thyromegaly present.  Cardiovascular: Normal rate, regular rhythm and normal heart sounds.   Pulmonary/Chest: Effort normal and breath sounds normal. No respiratory distress. She has no wheezes.  Abdominal: Soft. Bowel sounds are normal. She exhibits no distension and no mass. There is no guarding.       Very slight suprapubic tenderness without gaurding or rebound    Musculoskeletal: She exhibits no edema.       No cva tenderness   Lymphadenopathy:    She has no cervical adenopathy.  Neurological: She is alert. She has normal reflexes. No cranial nerve deficit. Coordination normal.  Skin: Skin is warm and dry. No rash noted. No erythema. No pallor.       1 cm area of induration and redness L inner buttock with a head on it but no drainage, firm and slt tender Ingrown hair noted in center  Psychiatric: She has a normal mood and affect.          Assessment & Plan:

## 2011-06-24 NOTE — Assessment & Plan Note (Signed)
More lately with some bladder discomfort  ua is fairly nl - sent for culture and will update inst to drink lots of water and call if symptoms worsen

## 2011-06-26 LAB — URINE CULTURE: Organism ID, Bacteria: NO GROWTH

## 2011-07-29 ENCOUNTER — Ambulatory Visit (INDEPENDENT_AMBULATORY_CARE_PROVIDER_SITE_OTHER): Payer: Medicare Other

## 2011-07-29 DIAGNOSIS — Z23 Encounter for immunization: Secondary | ICD-10-CM

## 2011-08-18 ENCOUNTER — Telehealth: Payer: Self-pay | Admitting: Family Medicine

## 2011-08-18 ENCOUNTER — Other Ambulatory Visit (INDEPENDENT_AMBULATORY_CARE_PROVIDER_SITE_OTHER): Payer: Medicare Other

## 2011-08-18 DIAGNOSIS — I10 Essential (primary) hypertension: Secondary | ICD-10-CM

## 2011-08-18 DIAGNOSIS — M949 Disorder of cartilage, unspecified: Secondary | ICD-10-CM

## 2011-08-18 DIAGNOSIS — D72818 Other decreased white blood cell count: Secondary | ICD-10-CM

## 2011-08-18 DIAGNOSIS — M899 Disorder of bone, unspecified: Secondary | ICD-10-CM

## 2011-08-18 DIAGNOSIS — E7800 Pure hypercholesterolemia, unspecified: Secondary | ICD-10-CM

## 2011-08-18 LAB — LIPID PANEL
Cholesterol: 189 mg/dL (ref 0–200)
HDL: 51.9 mg/dL (ref >39.00)
LDL Cholesterol: 118 mg/dL — ABNORMAL HIGH (ref 0–99)
Total CHOL/HDL Ratio: 4
Triglycerides: 94 mg/dL (ref 0.0–149.0)
VLDL: 18.8 mg/dL (ref 0.0–40.0)

## 2011-08-18 LAB — COMPREHENSIVE METABOLIC PANEL
Albumin: 4.2 g/dL (ref 3.5–5.2)
BUN: 26 mg/dL — ABNORMAL HIGH (ref 6–23)
CO2: 33 mEq/L — ABNORMAL HIGH (ref 19–32)
GFR: 63.12 mL/min (ref >60.00)
Glucose, Bld: 95 mg/dL (ref 70–99)
Potassium: 3.5 mEq/L (ref 3.5–5.1)
Sodium: 142 mEq/L (ref 135–145)
Total Bilirubin: 0.9 mg/dL (ref 0.3–1.2)
Total Protein: 7.4 g/dL (ref 6.0–8.3)

## 2011-08-18 LAB — CBC WITH DIFFERENTIAL/PLATELET
Basophils Relative: 0.6 % (ref 0.0–3.0)
Eosinophils Relative: 4.3 % (ref 0.0–5.0)
HCT: 44.5 % (ref 36.0–46.0)
Lymphs Abs: 1.3 10*3/uL (ref 0.7–4.0)
Monocytes Relative: 11.4 % (ref 3.0–12.0)
Platelets: 215 10*3/uL (ref 150.0–400.0)
RBC: 4.77 Mil/uL (ref 3.87–5.11)
WBC: 4.6 10*3/uL (ref 4.5–10.5)

## 2011-08-18 LAB — TSH: TSH: 3.43 u[IU]/mL (ref 0.35–5.50)

## 2011-08-18 NOTE — Telephone Encounter (Signed)
Message copied by Judy Pimple on Mon Aug 18, 2011  7:40 AM ------      Message from: Baldomero Lamy      Created: Wed Aug 13, 2011  8:06 AM      Regarding: Cpx labs Mon 10/22       Please order  future cpx labs for pt's upcomming lab appt.      Thanks      Rodney Booze

## 2011-08-19 LAB — VITAMIN D 25 HYDROXY (VIT D DEFICIENCY, FRACTURES): Vit D, 25-Hydroxy: 59 ng/mL (ref 30–89)

## 2011-08-20 ENCOUNTER — Ambulatory Visit (INDEPENDENT_AMBULATORY_CARE_PROVIDER_SITE_OTHER): Payer: 59 | Admitting: Family Medicine

## 2011-08-20 ENCOUNTER — Encounter: Payer: Self-pay | Admitting: Family Medicine

## 2011-08-20 VITALS — BP 110/76 | HR 60 | Temp 97.4°F | Ht 66.0 in | Wt 201.0 lb

## 2011-08-20 DIAGNOSIS — E876 Hypokalemia: Secondary | ICD-10-CM

## 2011-08-20 DIAGNOSIS — E7800 Pure hypercholesterolemia, unspecified: Secondary | ICD-10-CM

## 2011-08-20 DIAGNOSIS — M949 Disorder of cartilage, unspecified: Secondary | ICD-10-CM

## 2011-08-20 DIAGNOSIS — I10 Essential (primary) hypertension: Secondary | ICD-10-CM

## 2011-08-20 DIAGNOSIS — M899 Disorder of bone, unspecified: Secondary | ICD-10-CM

## 2011-08-20 NOTE — Assessment & Plan Note (Signed)
This is fairly well controlled Reviewed labs with pt  Rev low sat fat diet  Enc use of her exercise bike Not on chol med currently

## 2011-08-20 NOTE — Assessment & Plan Note (Signed)
I level ok with current dose - so no changes  No cramps or other symptoms

## 2011-08-20 NOTE — Assessment & Plan Note (Signed)
Well controlled with current medicines  No side effects  Urged her to exercise when able

## 2011-08-20 NOTE — Progress Notes (Signed)
Subjective:    Patient ID: [name], female    DOB: 12/27/1933, 77 y.o.   MRN: [id number]  HPI Here for annual check up of her chronic medical problems (and to review health mt list )  Doing ok overall   Stressed - husband may have had a stoke  His alzheimers is progressing too  She is trying to take care of herself too  Physically is doing ok   Still has very mild chronic cough- does not bother her   Wt is up 3 lb with bmi of 32  HTN on good control 110/76 No med side eff  No cp or ha or edema   Lipids are fairly controlled - LDL 118 Diet- is very careful with sat fats  occ sweets  Lab Results  Component Value Date   CHOL 189 08/18/2011   CHOL 168 08/12/2010   CHOL 183 07/16/2009   Lab Results  Component Value Date   HDL 51.90 08/18/2011   HDL 46.40 08/12/2010   HDL 48.10 07/16/2009   Lab Results  Component Value Date   LDLCALC 118* 08/18/2011   LDLCALC 100* 08/12/2010   LDLCALC 118* 07/16/2009   Lab Results  Component Value Date   TRIG 94.0 08/18/2011   TRIG 107.0 08/12/2010   TRIG 83.0 07/16/2009   Lab Results  Component Value Date   CHOLHDL 4 08/18/2011   CHOLHDL 4 08/12/2010   CHOLHDL 4 07/16/2009   No results found for this basename: LDLDIRECT     On K Lab Results  Component Value Date   K 3.5 08/18/2011   No cramps   OP- dexa ? 06 Ca and D- is doing well with that  Vit D level is good at 59  Mammogram 8/12 nl  Self exam no lumps or changes  Mother had breast cancer   Flu shot utd Td05 zost 09 ptx 2011 Good for shots  Gyn exam 09 No hx of abn paps No bleeding or symptoms at all    Colonoscopy is up to date 2003-will be due for recall 1 y  Patient Active Problem List  Diagnoses  . HYPERCHOLESTEROLEMIA, PURE  . HYPOKALEMIA  . DECREASED WHITE BLOOD CELL COUNT NOS  . HYPERTENSION, ESSENTIAL NOS  . PSVT  . SCLERODERMA  . DEGENERATIVE JOINT DISEASE  . SPINAL STENOSIS, LUMBAR  . OSTEOPENIA  . PES PLANUS  . SPONDYLOLISTHESIS   . COUGH, CHRONIC  . URINARY FREQUENCY, CHRONIC  . ALLERGY  . Ingrown hair   Past Medical History  Diagnosis Date  . Osteopenia   . Hyperlipidemia   . Hypertension   . PSVT (paroxysmal supraventricular tachycardia)   . Spinal stenosis   . Overactive bladder     urge incontinence   Past Surgical History  Procedure Date  . Joint replacement     left TKR  . Cataract extraction 2008  . Bladder suspension 1993   History  Substance Use Topics  . Smoking status: Never Smoker   . Smokeless tobacco: Not on file  . Alcohol Use: No   Family History  Problem Relation Age of Onset  . Cancer Mother     breast CA  . Heart disease Father     MI  . Diabetes Father   . Hypertension Father   . Cancer Sister     uterine CA   Allergies  Allergen Reactions  . Ace Inhibitors     REACTION: cough  . Penicillins     REACTION: hives  .  Sulfonamide Derivatives   . Tenivac    Current Outpatient Prescriptions on File Prior to Visit  Medication Sig Dispense Refill  . aspirin 325 MG tablet Take 325 mg by mouth daily.        Marland Kitchen atenolol (TENORMIN) 50 MG tablet TAKE 1 TABLET DAILY  90 tablet  1  . Calcium Carbonate-Vitamin D (CALCIUM-VITAMIN D) 600-200 MG-UNIT CAPS Take 1 capsule by mouth 3 (three) times daily.        Marland Kitchen DETROL LA 4 MG 24 hr capsule TAKE 1 CAPSULE ONCE DAILY  90 capsule  3  . diphenhydrAMINE (BENADRYL) 25 MG tablet Take 25 mg by mouth every morning.        . hydrochlorothiazide 25 MG tablet TAKE 1 TABLET DAILY  90 tablet  1  . Multiple Vitamins-Iron (MULTIVITAMIN/IRON PO) Take by mouth as directed.        . potassium chloride (K-DUR) 10 MEQ tablet Take 10 mEq by mouth daily.        Marland Kitchen albuterol (PROAIR HFA) 108 (90 BASE) MCG/ACT inhaler Inhale 2 puffs into the lungs every 4 (four) hours as needed.                Review of Systems Review of Systems  Constitutional: Negative for fever, appetite change, fatigue and unexpected weight change.  Eyes: Negative for pain and  visual disturbance.  Respiratory: Negative for cough and shortness of breath.   Cardiovascular: Negative for cp or palpitations    Gastrointestinal: Negative for nausea, diarrhea and constipation.  Genitourinary: Negative for urgency and frequency.  Skin: Negative for pallor or rash   Neurological: Negative for weakness, light-headedness, numbness and headaches.  Hematological: Negative for adenopathy. Does not bruise/bleed easily.  Psychiatric/Behavioral: Negative for dysphoric mood. The patient is not nervous/anxious.          Objective:   Physical Exam  Constitutional: She appears well-developed and well-nourished. No distress.  HENT:  Head: Normocephalic and atraumatic.  Right Ear: External ear normal.  Left Ear: External ear normal.  Nose: Nose normal.  Mouth/Throat: Oropharynx is clear and moist.  Eyes: Conjunctivae and EOM are normal. Pupils are equal, round, and reactive to light.  Neck: Normal range of motion. Neck supple. No JVD present. Carotid bruit is not present. No thyromegaly present.  Cardiovascular: Normal rate, regular rhythm, normal heart sounds and intact distal pulses.  Exam reveals no gallop.   Pulmonary/Chest: Effort normal and breath sounds normal. No respiratory distress. She has no wheezes.  Abdominal: Soft. Bowel sounds are normal. She exhibits no distension, no abdominal bruit and no mass. There is no tenderness.  Genitourinary: No breast swelling, tenderness, discharge or bleeding.  Musculoskeletal: Normal range of motion. She exhibits no edema and no tenderness.  Lymphadenopathy:    She has no cervical adenopathy.  Neurological: She is alert. She has normal reflexes. No cranial nerve deficit. She exhibits normal muscle tone. Coordination normal.       No tremor   Skin: Skin is warm and dry. No rash noted. No erythema. No pallor.  Psychiatric: She has a normal mood and affect.       Is stressed but pleasant and talkative           Assessment &  Plan:

## 2011-08-20 NOTE — Assessment & Plan Note (Signed)
Due for dexa- scheduling that  Disc exercise- wt bearing  Doing well with ca and D -- D level theraputic today  Will disc results when they return

## 2011-08-20 NOTE — Patient Instructions (Signed)
We will schedule dexa at check out  Keep working on healthy diet and exercise - use your recumbant bike

## 2011-09-12 ENCOUNTER — Ambulatory Visit (INDEPENDENT_AMBULATORY_CARE_PROVIDER_SITE_OTHER)
Admission: RE | Admit: 2011-09-12 | Discharge: 2011-09-12 | Disposition: A | Discharge status: Home / Self Care | Payer: 59 | Source: Ambulatory Visit | Attending: Family Medicine | Admitting: Family Medicine

## 2011-09-12 ENCOUNTER — Encounter: Payer: Self-pay | Admitting: Family Medicine

## 2011-09-12 ENCOUNTER — Ambulatory Visit (INDEPENDENT_AMBULATORY_CARE_PROVIDER_SITE_OTHER): Payer: 59 | Admitting: Family Medicine

## 2011-09-12 VITALS — BP 130/70 | HR 66 | Temp 98.2°F | Wt 202.2 lb

## 2011-09-12 DIAGNOSIS — R062 Wheezing: Secondary | ICD-10-CM

## 2011-09-12 DIAGNOSIS — R059 Cough, unspecified: Secondary | ICD-10-CM

## 2011-09-12 DIAGNOSIS — R05 Cough: Secondary | ICD-10-CM

## 2011-09-12 MED ORDER — POTASSIUM CHLORIDE 20 MEQ/15ML (10%) PO SOLN: 10.0000 meq | Freq: Every day | ORAL | Status: DC

## 2011-09-12 MED ORDER — LEVOFLOXACIN 500 MG PO TABS: 500.0000 mg | ORAL_TABLET | Freq: Every day | ORAL | Status: AC

## 2011-09-12 MED ORDER — ALBUTEROL SULFATE HFA 108 (90 BASE) MCG/ACT IN AERS: 2.0000 | INHALATION_SPRAY | RESPIRATORY_TRACT | Status: DC | PRN

## 2011-09-12 NOTE — Progress Notes (Signed)
  Subjective:    Patient ID: [name], female    DOB: 01-24-1934, 77 y.o.   MRN: [id number]  HPI CC: cough  Gets choked easily.  Had coughing fit yesterday after choking on multivitamin and calcium/vit D.  Tends to have trouble with potassium as well.  After this episode coughed up orange phlegm that burned back of throat.  Since then has had bad coughing fits.  + chills last night.  Cough dry, face hurts.  No fevers.  No congestion, HA, sneezing, ear pain or tooth pain.  No smokers at home.  No sick contacts at home.  Uses albuterol prn cough, no dx asthma/COPD.  Wife of [name].  Review of Systems Per HPI    Objective:   Physical Exam  Nursing note and vitals reviewed. Constitutional: She appears well-developed and well-nourished. No distress.  HENT:  Head: Normocephalic and atraumatic.  Right Ear: Hearing, tympanic membrane, external ear and ear canal normal.  Left Ear: Hearing, tympanic membrane, external ear and ear canal normal.  Nose: No mucosal edema or rhinorrhea. Right sinus exhibits maxillary sinus tenderness. Right sinus exhibits no frontal sinus tenderness. Left sinus exhibits maxillary sinus tenderness. Left sinus exhibits no frontal sinus tenderness.  Mouth/Throat: Uvula is midline, oropharynx is clear and moist and mucous membranes are normal. No oropharyngeal exudate, posterior oropharyngeal edema, posterior oropharyngeal erythema or tonsillar abscesses.       Mild maxillary sinus tenderness  Eyes: Conjunctivae and EOM are normal. Pupils are equal, round, and reactive to light. No scleral icterus.  Neck: Normal range of motion. Neck supple.  Cardiovascular: Normal rate, regular rhythm, normal heart sounds and intact distal pulses.   No murmur heard. Pulmonary/Chest: Effort normal. No respiratory distress. She has decreased breath sounds. She has wheezes (end exp). She has rhonchi in the right middle field and the right lower field. She has rales in the right  middle field and the right lower field.  Lymphadenopathy:    She has no cervical adenopathy.  Skin: Skin is warm and dry. No rash noted.  Psychiatric: She has a normal mood and affect.      Assessment & Plan:

## 2011-09-12 NOTE — Assessment & Plan Note (Addendum)
Started after possible aspiration yesterday. xray checked today - overall clear, no evident infection, no whiteout. H/o cyclic cough in past. Will treat for PNA with levaquin (pcn allergy) and albuterol prn wheezing/sob. If worsening, add clinda.  rec chewable calcium/vitamin D rec change potassium to liquid form (sent in). rec stop multivitamin for now while gets over cough.

## 2011-09-12 NOTE — Patient Instructions (Addendum)
xray today.  Overall looking ok. I will treat you with levaquin daily for 7 days as well as albuterol inhaler to use as needed if feeling wheezy or tight. I'd recommend changing to chewable calcium/vitamin D supplement I'd also recommend changing to liquid potassium instead of big pill. Stop multivitamin for now. Please let us know if not improving as expected or any concerns, fever >101.5 or worsening cough or shortness of breath.

## 2011-09-16 ENCOUNTER — Telehealth: Payer: Self-pay | Admitting: *Deleted

## 2011-09-16 MED ORDER — CLINDAMYCIN HCL 300 MG PO CAPS: 300.0000 mg | ORAL_CAPSULE | Freq: Three times a day (TID) | ORAL | Status: AC

## 2011-09-16 NOTE — Telephone Encounter (Signed)
Recommend finish levaquin. As was concern for aspiration PNA, would like to add on second antibiotic - clindamycin 300mg  three times daily for 7 days.  Sent in. If she'd like I can also send in stronger cough syrup.  Let me know.

## 2011-09-16 NOTE — Telephone Encounter (Signed)
Pt was seen last Friday for cough and congestion and was given levaquin.  She has 2 pills left and she says her cough has gotten worse. Slightly productive with clear to yellow mucous.  No fever, no sinus congestion or drainage.  She is asking if she should continue with antibiotic. She's taking robitussin for the cough, but that isnt really helping.   [facility]

## 2011-09-17 ENCOUNTER — Telehealth: Payer: Self-pay | Admitting: Family Medicine

## 2011-09-17 NOTE — Telephone Encounter (Signed)
Requesting call back at [phone]

## 2011-09-17 NOTE — Telephone Encounter (Signed)
Spoke with patient and she was confused on her medications, pt picked up levaquin last night per the pharmacy, and per Dr.G do not take levaquin, take clindamycin ( which she haven't picked up). Left message with husband to have patient return my call.  FYI-- 09/12/11 picked up potassium & proair 09/16/11 picked up levaquin & proair

## 2011-09-22 MED ORDER — GUAIFENESIN-CODEINE 100-10 MG/5ML PO SYRP: 5.0000 mL | ORAL_SOLUTION | Freq: Two times a day (BID) | ORAL | Status: AC | PRN

## 2011-09-22 NOTE — Telephone Encounter (Signed)
Noted. Thanks.  i'm glad cough is getting better.  Does she want stronger cough syrup?  Otherwise give cough more time, in interim let us know if not improving as expected.

## 2011-09-22 NOTE — Telephone Encounter (Signed)
Rx called in and patient notified.  

## 2011-09-22 NOTE — Telephone Encounter (Signed)
Spoke with patient. She advised she would like some cough syrup called in. She doesn't have anything but OTC Robitussin and it has not been any help. I advised we would send something in for her. She is aware to call back if no improvement.

## 2011-09-22 NOTE — Telephone Encounter (Signed)
Message left for patient to return my call.  

## 2011-09-22 NOTE — Telephone Encounter (Signed)
May send in cheratussin

## 2011-09-22 NOTE — Telephone Encounter (Signed)
Spoke with patient and she is taking the clindamycin, she has 12 pills left from the 21,  She states she feels better but still have a cough.

## 2011-11-03 ENCOUNTER — Other Ambulatory Visit: Payer: Self-pay | Admitting: Family Medicine

## 2011-11-11 ENCOUNTER — Other Ambulatory Visit: Payer: Self-pay | Admitting: Family Medicine

## 2011-12-15 ENCOUNTER — Other Ambulatory Visit: Payer: Self-pay | Admitting: Orthopedic Surgery

## 2011-12-30 ENCOUNTER — Encounter (HOSPITAL_COMMUNITY): Payer: Self-pay | Admitting: Pharmacy Technician

## 2012-01-05 ENCOUNTER — Encounter (HOSPITAL_COMMUNITY)
Admission: RE | Admit: 2012-01-05 | Discharge: 2012-01-05 | Disposition: A | Discharge status: Home / Self Care | Payer: Medicare Other | Source: Ambulatory Visit | Attending: Orthopedic Surgery | Admitting: Orthopedic Surgery

## 2012-01-05 ENCOUNTER — Other Ambulatory Visit: Payer: Self-pay

## 2012-01-05 ENCOUNTER — Encounter (HOSPITAL_COMMUNITY): Payer: Self-pay

## 2012-01-05 LAB — URINALYSIS, ROUTINE W REFLEX MICROSCOPIC
Bilirubin Urine: NEGATIVE
Ketones, ur: NEGATIVE mg/dL
Nitrite: NEGATIVE
Specific Gravity, Urine: 1.009 (ref 1.005–1.030)
Urobilinogen, UA: 0.2 mg/dL (ref 0.0–1.0)

## 2012-01-05 LAB — PROTIME-INR
INR: 1.06 (ref 0.00–1.49)
Prothrombin Time: 14 seconds (ref 11.6–15.2)

## 2012-01-05 LAB — CBC
Hemoglobin: 15.2 g/dL — ABNORMAL HIGH (ref 12.0–15.0)
MCV: 93.1 fL (ref 78.0–100.0)
Platelets: 213 10*3/uL (ref 150–400)
RBC: 4.96 MIL/uL (ref 3.87–5.11)
WBC: 4.6 10*3/uL (ref 4.0–10.5)

## 2012-01-05 LAB — DIFFERENTIAL
Eosinophils Relative: 5 % (ref 0–5)
Lymphocytes Relative: 32 % (ref 12–46)
Lymphs Abs: 1.5 10*3/uL (ref 0.7–4.0)

## 2012-01-05 LAB — TYPE AND SCREEN: Antibody Screen: NEGATIVE

## 2012-01-05 LAB — BASIC METABOLIC PANEL
BUN: 18 mg/dL (ref 6–23)
Calcium: 9.7 mg/dL (ref 8.4–10.5)
Creatinine, Ser: 0.81 mg/dL (ref 0.50–1.10)
Glucose, Bld: 101 mg/dL — ABNORMAL HIGH (ref 70–99)

## 2012-01-05 LAB — ABO/RH: ABO/RH(D): O POS

## 2012-01-05 MED ORDER — CHLORHEXIDINE GLUCONATE 4 % EX LIQD: 60.0000 mL | Freq: Once | CUTANEOUS | Status: DC

## 2012-01-11 NOTE — H&P (Signed)
  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  According to the patient from the initial surgery she's had lateral subluxations and dislocations of the patella when she flexes beyond about 70 and she is had a chronic right trigger thumb for many years would like to have that released as well.  The trigger thumb is been treated with injections but continues to bother her.  Regarding the total knee she's never had any fevers or chills or wound problems and does not have any effusions.  She reports that the left total knee is quite weak when she goes from sitting to standing because of the lateral dislocations of the patella the one she is standing it does not bother her much at all.  PMH: Hypertension.  PSH: TKA, cholecystectomy  Social Hx: Denies alcohol or tobacco  Family Hx: Non-contributory  ROS: Patient denies dizziness, nausea, fever, chills, vomiting, shortness of breath, chest pain, loss of appetite, or rash.    PHYSICAL EXAM: Well-developed, well-nourished.  Awake, alert, and oriented x3.  Extraocular motion is intact.  No use of accessory respiratory muscles for breathing.   Cardiovascular exam reveals a regular rhythm.  Skin is intact without cuts, scrapes, or abrasions. Left total knee range of motion is 0-105 at 70 the patella laterally dislocates and cannot be reduced with thumb pressure.  Between 40 and 70, it laterally subluxes, but is reducible.  There is no effusion.  At full extension there is minimal ligamentous laxity at 45 there is varus and valgus laxity, about one plus.  Same at 90.  X-rays from January 2013 are reviewed showing well-placed well fixed LCS prosthesis with lateral dislocation of the patella on the flexed view.  This is a metal-backed patella.  On physical examination, she does have a palpable right trigger thumb.  She is otherwise neurovascularly  intact.  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  Right trigger thumb.  Plan: Risks and benefits of revision surgery were discussed at length with the patient and her husband.  She desires elective revision to decrease pain and increase function and desires to proceed with surgery.  The plan will be to do a lateral release and medial reefing and probably revision of the patellar component and possibly the tibial bearing.  We'll also do a right trigger thumb release as I believe she will be able to operate a walker with a soft tissue procedure.  She'll discuss scheduling with Agustin Cree

## 2012-01-12 ENCOUNTER — Ambulatory Visit (HOSPITAL_COMMUNITY): Payer: Medicare Other | Admitting: Certified Registered"

## 2012-01-12 ENCOUNTER — Encounter (HOSPITAL_COMMUNITY): Payer: Self-pay | Admitting: Certified Registered"

## 2012-01-12 ENCOUNTER — Encounter (HOSPITAL_COMMUNITY): Payer: Self-pay | Admitting: *Deleted

## 2012-01-12 ENCOUNTER — Inpatient Hospital Stay (HOSPITAL_COMMUNITY)
Admission: RE | Admit: 2012-01-12 | Discharge: 2012-01-15 | DRG: 470 | Disposition: A | Discharge status: Home Health | Payer: Medicare Other | Source: Ambulatory Visit | Attending: Orthopedic Surgery | Admitting: Orthopedic Surgery

## 2012-01-12 ENCOUNTER — Encounter (HOSPITAL_COMMUNITY)
Admission: RE | Disposition: A | Discharge status: Home Health | Payer: Self-pay | Source: Ambulatory Visit | Attending: Orthopedic Surgery

## 2012-01-12 DIAGNOSIS — E785 Hyperlipidemia, unspecified: Secondary | ICD-10-CM | POA: Diagnosis present

## 2012-01-12 DIAGNOSIS — I10 Essential (primary) hypertension: Secondary | ICD-10-CM | POA: Diagnosis present

## 2012-01-12 DIAGNOSIS — M6530 Trigger finger, unspecified finger: Secondary | ICD-10-CM | POA: Diagnosis present

## 2012-01-12 DIAGNOSIS — N318 Other neuromuscular dysfunction of bladder: Secondary | ICD-10-CM | POA: Diagnosis present

## 2012-01-12 DIAGNOSIS — M4800 Spinal stenosis, site unspecified: Secondary | ICD-10-CM | POA: Diagnosis present

## 2012-01-12 DIAGNOSIS — T84029A Dislocation of unspecified internal joint prosthesis, initial encounter: Principal | ICD-10-CM | POA: Diagnosis present

## 2012-01-12 DIAGNOSIS — Y849 Medical procedure, unspecified as the cause of abnormal reaction of the patient, or of later complication, without mention of misadventure at the time of the procedure: Secondary | ICD-10-CM | POA: Diagnosis present

## 2012-01-12 DIAGNOSIS — Z882 Allergy status to sulfonamides status: Secondary | ICD-10-CM

## 2012-01-12 DIAGNOSIS — T8484XA Pain due to internal orthopedic prosthetic devices, implants and grafts, initial encounter: Secondary | ICD-10-CM

## 2012-01-12 DIAGNOSIS — N3941 Urge incontinence: Secondary | ICD-10-CM | POA: Diagnosis present

## 2012-01-12 DIAGNOSIS — M899 Disorder of bone, unspecified: Secondary | ICD-10-CM | POA: Diagnosis present

## 2012-01-12 DIAGNOSIS — Z01812 Encounter for preprocedural laboratory examination: Secondary | ICD-10-CM

## 2012-01-12 DIAGNOSIS — Z880 Allergy status to penicillin: Secondary | ICD-10-CM

## 2012-01-12 DIAGNOSIS — Z96659 Presence of unspecified artificial knee joint: Secondary | ICD-10-CM

## 2012-01-12 HISTORY — PX: TRIGGER FINGER RELEASE: SHX641

## 2012-01-12 HISTORY — PX: TOTAL KNEE REVISION: SHX996

## 2012-01-12 SURGERY — TOTAL KNEE REVISION
Anesthesia: General | Site: Knee | Laterality: Right | Wound class: Clean

## 2012-01-12 MED ORDER — DROPERIDOL 2.5 MG/ML IJ SOLN
INTRAMUSCULAR | Status: DC | PRN
  Administered 2012-01-12: 0.625 mg via INTRAVENOUS

## 2012-01-12 MED ORDER — BUPIVACAINE-EPINEPHRINE PF 0.5-1:200000 % IJ SOLN
INTRAMUSCULAR | Status: DC | PRN
  Administered 2012-01-12: 30 mL

## 2012-01-12 MED ORDER — ROCURONIUM BROMIDE 100 MG/10ML IV SOLN
INTRAVENOUS | Status: DC | PRN
  Administered 2012-01-12: 50 mg via INTRAVENOUS

## 2012-01-12 MED ORDER — MIDAZOLAM HCL 5 MG/5ML IJ SOLN
INTRAMUSCULAR | Status: DC | PRN
  Administered 2012-01-12: 2 mg via INTRAVENOUS

## 2012-01-12 MED ORDER — ACETAMINOPHEN 10 MG/ML IV SOLN
INTRAVENOUS | Status: DC | PRN
  Administered 2012-01-12: 1000 mg via INTRAVENOUS

## 2012-01-12 MED ORDER — POTASSIUM CHLORIDE CRYS ER 10 MEQ PO TBCR
10.0000 meq | EXTENDED_RELEASE_TABLET | Freq: Every day | ORAL | Status: DC
  Administered 2012-01-12 – 2012-01-15 (×4): 10 meq via ORAL
  Filled 2012-01-12 (×4): qty 1

## 2012-01-12 MED ORDER — VANCOMYCIN HCL 1000 MG IV SOLR
1000.0000 mg | INTRAVENOUS | Status: DC | PRN
  Administered 2012-01-12: 1000 mg via INTRAVENOUS

## 2012-01-12 MED ORDER — ZOLPIDEM TARTRATE 5 MG PO TABS: 5.0000 mg | ORAL_TABLET | Freq: Every evening | ORAL | Status: DC | PRN

## 2012-01-12 MED ORDER — SODIUM CHLORIDE 0.9 % IR SOLN
Status: DC | PRN
  Administered 2012-01-12: 3000 mL

## 2012-01-12 MED ORDER — DEXTROSE 5 % IV SOLN
INTRAVENOUS | Status: DC | PRN
  Administered 2012-01-12: 08:00:00 via INTRAVENOUS

## 2012-01-12 MED ORDER — HYDROCODONE-ACETAMINOPHEN 5-325 MG PO TABS
1.0000 | ORAL_TABLET | ORAL | Status: DC | PRN
  Administered 2012-01-12: 1 via ORAL
  Administered 2012-01-13: 2 via ORAL
  Administered 2012-01-13: 1 via ORAL
  Administered 2012-01-13 – 2012-01-15 (×4): 2 via ORAL
  Filled 2012-01-12 (×4): qty 2
  Filled 2012-01-12 (×2): qty 1
  Filled 2012-01-12: qty 2

## 2012-01-12 MED ORDER — FLEET ENEMA 7-19 GM/118ML RE ENEM: 1.0000 | ENEMA | Freq: Once | RECTAL | Status: AC | PRN

## 2012-01-12 MED ORDER — HYDROCHLOROTHIAZIDE 25 MG PO TABS
25.0000 mg | ORAL_TABLET | Freq: Every day | ORAL | Status: DC
  Administered 2012-01-12 – 2012-01-15 (×4): 25 mg via ORAL
  Filled 2012-01-12 (×4): qty 1

## 2012-01-12 MED ORDER — WARFARIN SODIUM 6 MG PO TABS
6.0000 mg | ORAL_TABLET | Freq: Once | ORAL | Status: AC
  Administered 2012-01-12: 6 mg via ORAL
  Filled 2012-01-12: qty 1

## 2012-01-12 MED ORDER — ONDANSETRON HCL 4 MG/2ML IJ SOLN: 4.0000 mg | Freq: Four times a day (QID) | INTRAMUSCULAR | Status: DC | PRN

## 2012-01-12 MED ORDER — ATENOLOL 50 MG PO TABS
50.0000 mg | ORAL_TABLET | Freq: Every day | ORAL | Status: DC
  Administered 2012-01-13 – 2012-01-15 (×3): 50 mg via ORAL
  Filled 2012-01-12 (×4): qty 1

## 2012-01-12 MED ORDER — BISACODYL 10 MG RE SUPP
10.0000 mg | Freq: Every day | RECTAL | Status: DC | PRN
  Administered 2012-01-14: 10 mg via RECTAL
  Filled 2012-01-12: qty 1

## 2012-01-12 MED ORDER — LACTATED RINGERS IV SOLN
INTRAVENOUS | Status: DC | PRN
  Administered 2012-01-12: 07:00:00 via INTRAVENOUS

## 2012-01-12 MED ORDER — EPHEDRINE SULFATE 50 MG/ML IJ SOLN
INTRAMUSCULAR | Status: DC | PRN
  Administered 2012-01-12: 5 mg via INTRAVENOUS
  Administered 2012-01-12 (×2): 10 mg via INTRAVENOUS
  Administered 2012-01-12: 5 mg via INTRAVENOUS

## 2012-01-12 MED ORDER — ACETAMINOPHEN 10 MG/ML IV SOLN
INTRAVENOUS | Status: AC
  Filled 2012-01-12: qty 100

## 2012-01-12 MED ORDER — METOCLOPRAMIDE HCL 5 MG/ML IJ SOLN: 5.0000 mg | Freq: Three times a day (TID) | INTRAMUSCULAR | Status: DC | PRN

## 2012-01-12 MED ORDER — METHOCARBAMOL 100 MG/ML IJ SOLN
500.0000 mg | Freq: Four times a day (QID) | INTRAMUSCULAR | Status: DC | PRN
  Filled 2012-01-12: qty 5

## 2012-01-12 MED ORDER — MORPHINE SULFATE 2 MG/ML IJ SOLN: 0.0500 mg/kg | INTRAMUSCULAR | Status: DC | PRN

## 2012-01-12 MED ORDER — NEOSTIGMINE METHYLSULFATE 1 MG/ML IJ SOLN
INTRAMUSCULAR | Status: DC | PRN
  Administered 2012-01-12: 3 mg via INTRAVENOUS

## 2012-01-12 MED ORDER — OXYCODONE-ACETAMINOPHEN 5-325 MG PO TABS: 1.0000 | ORAL_TABLET | ORAL | Status: DC | PRN

## 2012-01-12 MED ORDER — COUMADIN BOOK
Freq: Once | Status: AC
  Administered 2012-01-12: 11:00:00
  Filled 2012-01-12: qty 1

## 2012-01-12 MED ORDER — ACETAMINOPHEN 650 MG RE SUPP: 650.0000 mg | Freq: Four times a day (QID) | RECTAL | Status: DC | PRN

## 2012-01-12 MED ORDER — GLYCOPYRROLATE 0.2 MG/ML IJ SOLN
INTRAMUSCULAR | Status: DC | PRN
  Administered 2012-01-12: 0.4 mg via INTRAVENOUS

## 2012-01-12 MED ORDER — WARFARIN VIDEO: Freq: Once | Status: DC

## 2012-01-12 MED ORDER — METOCLOPRAMIDE HCL 10 MG PO TABS: 5.0000 mg | ORAL_TABLET | Freq: Three times a day (TID) | ORAL | Status: DC | PRN

## 2012-01-12 MED ORDER — MENTHOL 3 MG MT LOZG: 1.0000 | LOZENGE | OROMUCOSAL | Status: DC | PRN

## 2012-01-12 MED ORDER — LIDOCAINE HCL (CARDIAC) 20 MG/ML IV SOLN
INTRAVENOUS | Status: DC | PRN
  Administered 2012-01-12: 1000 mg via INTRAVENOUS

## 2012-01-12 MED ORDER — ONDANSETRON HCL 4 MG/2ML IJ SOLN: 4.0000 mg | Freq: Once | INTRAMUSCULAR | Status: DC | PRN

## 2012-01-12 MED ORDER — PHENOL 1.4 % MT LIQD: 1.0000 | OROMUCOSAL | Status: DC | PRN

## 2012-01-12 MED ORDER — ACETAMINOPHEN 325 MG PO TABS
650.0000 mg | ORAL_TABLET | Freq: Four times a day (QID) | ORAL | Status: DC | PRN
  Administered 2012-01-12: 325 mg via ORAL
  Filled 2012-01-12: qty 1

## 2012-01-12 MED ORDER — DIPHENHYDRAMINE HCL 12.5 MG/5ML PO ELIX: 12.5000 mg | ORAL_SOLUTION | ORAL | Status: DC | PRN

## 2012-01-12 MED ORDER — METHOCARBAMOL 500 MG PO TABS
500.0000 mg | ORAL_TABLET | Freq: Four times a day (QID) | ORAL | Status: DC | PRN
  Administered 2012-01-12 – 2012-01-15 (×3): 500 mg via ORAL
  Filled 2012-01-12 (×3): qty 1

## 2012-01-12 MED ORDER — PROPOFOL 10 MG/ML IV EMUL
INTRAVENOUS | Status: DC | PRN
  Administered 2012-01-12: 120 mg via INTRAVENOUS

## 2012-01-12 MED ORDER — TOLTERODINE TARTRATE ER 4 MG PO CP24
4.0000 mg | ORAL_CAPSULE | Freq: Every day | ORAL | Status: DC
  Administered 2012-01-12 – 2012-01-15 (×4): 4 mg via ORAL
  Filled 2012-01-12 (×4): qty 1

## 2012-01-12 MED ORDER — LIDOCAINE HCL 4 % MT SOLN
OROMUCOSAL | Status: DC | PRN
  Administered 2012-01-12: 4 mL via TOPICAL

## 2012-01-12 MED ORDER — ALUM & MAG HYDROXIDE-SIMETH 200-200-20 MG/5ML PO SUSP: 30.0000 mL | ORAL | Status: DC | PRN

## 2012-01-12 MED ORDER — WARFARIN - PHARMACIST DOSING INPATIENT: Freq: Every day | Status: DC

## 2012-01-12 MED ORDER — ONDANSETRON HCL 4 MG/2ML IJ SOLN
INTRAMUSCULAR | Status: DC | PRN
  Administered 2012-01-12: 4 mg via INTRAVENOUS

## 2012-01-12 MED ORDER — SUFENTANIL CITRATE 50 MCG/ML IV SOLN
INTRAVENOUS | Status: DC | PRN
  Administered 2012-01-12: 10 ug via INTRAVENOUS
  Administered 2012-01-12: 20 ug via INTRAVENOUS

## 2012-01-12 MED ORDER — DEXAMETHASONE SODIUM PHOSPHATE 4 MG/ML IJ SOLN
INTRAMUSCULAR | Status: DC | PRN
  Administered 2012-01-12: 4 mg via INTRAVENOUS

## 2012-01-12 MED ORDER — KCL IN DEXTROSE-NACL 20-5-0.45 MEQ/L-%-% IV SOLN
INTRAVENOUS | Status: DC
Start: 2012-01-12 — End: 2012-01-15
  Administered 2012-01-12 – 2012-01-13 (×4): via INTRAVENOUS
  Filled 2012-01-12 (×11): qty 1000

## 2012-01-12 MED ORDER — HYDROMORPHONE HCL PF 1 MG/ML IJ SOLN: 0.2500 mg | INTRAMUSCULAR | Status: DC | PRN

## 2012-01-12 MED ORDER — ONDANSETRON HCL 4 MG PO TABS: 4.0000 mg | ORAL_TABLET | Freq: Four times a day (QID) | ORAL | Status: DC | PRN

## 2012-01-12 MED ORDER — HYDROMORPHONE HCL PF 1 MG/ML IJ SOLN: 0.5000 mg | INTRAMUSCULAR | Status: DC | PRN

## 2012-01-12 MED ORDER — DEXTROSE-NACL 5-0.45 % IV SOLN: INTRAVENOUS | Status: DC

## 2012-01-12 MED ORDER — VANCOMYCIN HCL IN DEXTROSE 1-5 GM/200ML-% IV SOLN
INTRAVENOUS | Status: AC
  Filled 2012-01-12: qty 200

## 2012-01-12 MED ORDER — MAGNESIUM HYDROXIDE 400 MG/5ML PO SUSP: 30.0000 mL | Freq: Every day | ORAL | Status: DC | PRN

## 2012-01-12 SURGICAL SUPPLY — 71 items
BANDAGE ELASTIC 3 VELCRO ST LF (GAUZE/BANDAGES/DRESSINGS) ×1 IMPLANT
BANDAGE ELASTIC 4 VELCRO ST LF (GAUZE/BANDAGES/DRESSINGS) ×3 IMPLANT
BANDAGE ELASTIC 6 VELCRO ST LF (GAUZE/BANDAGES/DRESSINGS) ×3 IMPLANT
BANDAGE ESMARK 6X9 LF (GAUZE/BANDAGES/DRESSINGS) ×2 IMPLANT
BEARING TIB LCS DD STD/LG 15 (Knees) ×1 IMPLANT
BLADE SAG 18X100X1.27 (BLADE) ×1 IMPLANT
BLADE SAW SAG 90X13X1.27 (BLADE) ×1 IMPLANT
BLADE SURG ROTATE 9660 (MISCELLANEOUS) IMPLANT
BNDG CMPR 9X6 STRL LF SNTH (GAUZE/BANDAGES/DRESSINGS) ×2
BNDG ESMARK 6X9 LF (GAUZE/BANDAGES/DRESSINGS) ×3
BOWL SMART MIX CTS (DISPOSABLE) IMPLANT
CLOTH BEACON ORANGE TIMEOUT ST (SAFETY) ×3 IMPLANT
COVER BACK TABLE 24X17X13 BIG (DRAPES) IMPLANT
COVER SURGICAL LIGHT HANDLE (MISCELLANEOUS) ×5 IMPLANT
CUFF TOURNIQUET SINGLE 18IN (TOURNIQUET CUFF) ×1 IMPLANT
CUFF TOURNIQUET SINGLE 34IN LL (TOURNIQUET CUFF) IMPLANT
CUFF TOURNIQUET SINGLE 44IN (TOURNIQUET CUFF) ×1 IMPLANT
DISC DIAMOND MED (BURR) IMPLANT
DRAPE EXTREMITY T 121X128X90 (DRAPE) ×4 IMPLANT
DRAPE PROXIMA HALF (DRAPES) ×1 IMPLANT
DRAPE SURG 17X23 STRL (DRAPES) ×1 IMPLANT
DRAPE U-SHAPE 47X51 STRL (DRAPES) ×3 IMPLANT
DRSG PAD ABDOMINAL 8X10 ST (GAUZE/BANDAGES/DRESSINGS) IMPLANT
DURAPREP 26ML APPLICATOR (WOUND CARE) ×3 IMPLANT
ELECT REM PT RETURN 9FT ADLT (ELECTROSURGICAL) ×3
ELECTRODE REM PT RTRN 9FT ADLT (ELECTROSURGICAL) ×2 IMPLANT
EVACUATOR 1/8 PVC DRAIN (DRAIN) ×3 IMPLANT
GAUZE XEROFORM 1X8 LF (GAUZE/BANDAGES/DRESSINGS) ×5 IMPLANT
GLOVE BIO SURGEON STRL SZ7 (GLOVE) ×3 IMPLANT
GLOVE BIO SURGEON STRL SZ7.5 (GLOVE) ×3 IMPLANT
GLOVE BIOGEL PI IND STRL 6.5 (GLOVE) ×1 IMPLANT
GLOVE BIOGEL PI IND STRL 7.0 (GLOVE) ×2 IMPLANT
GLOVE BIOGEL PI IND STRL 8 (GLOVE) ×2 IMPLANT
GLOVE BIOGEL PI INDICATOR 6.5 (GLOVE) ×1
GLOVE BIOGEL PI INDICATOR 7.0 (GLOVE) ×1
GLOVE BIOGEL PI INDICATOR 8 (GLOVE) ×1
GLOVE SURG SS PI 6.5 STRL IVOR (GLOVE) ×1 IMPLANT
GOWN PREVENTION PLUS XLARGE (GOWN DISPOSABLE) ×3 IMPLANT
GOWN STRL NON-REIN LRG LVL3 (GOWN DISPOSABLE) ×4 IMPLANT
HANDPIECE INTERPULSE COAX TIP (DISPOSABLE) ×3
HOOD PEEL AWAY FACE SHEILD DIS (HOOD) ×8 IMPLANT
KIT BASIN OR (CUSTOM PROCEDURE TRAY) ×3 IMPLANT
KIT ROOM TURNOVER OR (KITS) ×3 IMPLANT
MANIFOLD NEPTUNE II (INSTRUMENTS) ×3 IMPLANT
NS IRRIG 1000ML POUR BTL (IV SOLUTION) ×3 IMPLANT
PACK TOTAL JOINT (CUSTOM PROCEDURE TRAY) ×3 IMPLANT
PAD ARMBOARD 7.5X6 YLW CONV (MISCELLANEOUS) ×6 IMPLANT
PAD CAST 4YDX4 CTTN HI CHSV (CAST SUPPLIES) ×2 IMPLANT
PADDING CAST COTTON 4X4 STRL (CAST SUPPLIES) ×3
PADDING CAST COTTON 6X4 STRL (CAST SUPPLIES) ×3 IMPLANT
PADDING CAST SYNTHETIC 2 (CAST SUPPLIES) ×1
PADDING CAST SYNTHETIC 2X4 NS (CAST SUPPLIES) IMPLANT
PEG LCS COM 3 MB STD (Knees) ×1 IMPLANT
RASP HELIOCORDIAL MED (MISCELLANEOUS) IMPLANT
SET HNDPC FAN SPRY TIP SCT (DISPOSABLE) IMPLANT
SPONGE GAUZE 4X4 12PLY (GAUZE/BANDAGES/DRESSINGS) ×5 IMPLANT
STAPLER VISISTAT 35W (STAPLE) ×3 IMPLANT
STOCKINETTE 4X48 STRL (DRAPES) ×1 IMPLANT
SUCTION FRAZIER TIP 10 FR DISP (SUCTIONS) ×3 IMPLANT
SUT ETHILON 4 0 PS 2 18 (SUTURE) ×2 IMPLANT
SUT VIC AB 0 CT1 27 (SUTURE) ×6
SUT VIC AB 0 CT1 27XBRD ANBCTR (SUTURE) ×2 IMPLANT
SUT VIC AB 1 CTX 36 (SUTURE) ×6
SUT VIC AB 1 CTX36XBRD ANBCTR (SUTURE) ×3 IMPLANT
SUT VIC AB 2-0 CT1 27 (SUTURE) ×6
SUT VIC AB 2-0 CT1 TAPERPNT 27 (SUTURE) ×2 IMPLANT
TOWEL OR 17X24 6PK STRL BLUE (TOWEL DISPOSABLE) ×3 IMPLANT
TOWEL OR 17X26 10 PK STRL BLUE (TOWEL DISPOSABLE) ×3 IMPLANT
TRAY FOLEY CATH 14FR (SET/KITS/TRAYS/PACK) IMPLANT
TUBE ANAEROBIC SPECIMEN COL (MISCELLANEOUS) ×3 IMPLANT
WATER STERILE IRR 1000ML POUR (IV SOLUTION) ×6 IMPLANT

## 2012-01-12 NOTE — Anesthesia Postprocedure Evaluation (Signed)
Anesthesia Post Note  Patient: [name]  Procedure(s) Performed: Procedure(s) (LRB): TOTAL KNEE REVISION (Left) RELEASE TRIGGER FINGER/A-1 PULLEY (Right)  Anesthesia type: general  Patient location: PACU  Post pain: Pain level controlled  Post assessment: Patient's Cardiovascular Status Stable  Last Vitals:  Filed Vitals:   01/12/12 0951  BP: 114/56  Pulse: 61  Temp:   Resp: 14    Post vital signs: Reviewed and stable  Level of consciousness: sedated  Complications: No apparent anesthesia complications

## 2012-01-12 NOTE — Op Note (Signed)
Preoperative diagnosis: Chronically dislocating left total knee, right trigger thumb  Postoperative diagnosis: Same  Procedure: Revision left total knee arthroplasty with replacement of the 12.5 mm rotating platform bearing with a new 12.5 mm rotating platform bearing, replacement of the patellar button with a new rotating patellar button, 100% medial reefing and open lateral release.  Surgeon Feliberto Gottron. Turner Daniels M.D.  First Asst.: Shirl Harris PA-C  Anesthetic: Gen. endotracheal with femoral nerve block  Estimated blood loss: Minimal  Fluid replacement: 1800 cc of crystalloid  Tourniquet time: 60 minutes left knee, 15 minutes right wrist  Indications for procedure: Status post left total knee 7 or 8 years ago with chronic lateral dislocations of the patella anytime she bends past about 60. She's had increasing pain and weakness and now desires revision to prevent the weakness. She also has a fairly impressive right trigger thumb would like a release as well. Risks and benefits of surgery have been discussed and all questions answered. The chance of medial reefing and laterally is working around 80-85% and the patient understands this.  Description of procedure: Patient was identified by arm band receive preoperative IV antibiotics in the holding area. She then received a left femoral nerve block and was taken to the operating room where the appropriate anesthetic monitors were attached and general endotracheal anesthesia induced with the patient in the supine position. Tourniquets were applied high the left thigh and to the right forearm. Both limbs were then prepped and draped in the usual sterile fashion. A time out procedure was performed. We began by wrapping the left lower extremity with an Esmarch bandage and inflating the tourniquet to 350 mm mercury. We then recreated the medial parapatellar arthrotomy, and the patellar button fell off of the metal backing. Scar tissue was removed  medially and laterally and a generous lateral release was performed from the Gertie's tubercle proximally for about 7 or 8 cm. We then trimmed redundant tissue from the medial retinaculum. At this time the knee was hyperflexed and were removed the 12.5 mm polyethylene bearing and replaced with a new 12.5 mm polyethylene rotating platform bearing. Using towel clips we then imbricated medially bringing the medial tendon over the top of the lateral tendon for a 100% reefing. The knee was taken through range of motion and no subluxation was noted between 0 and 110. We did not push it any farther. A new polyethylene button was then snapped into place and the wound thoroughly irrigated with normal saline solution pulse lavage. We then closed the medial parapatellar arthrotomy with a 100% reefing superiorly and over the patella with #1 Vicryl suture. We checked our stability one more time. Prior to this closure a small Hemovac drain was placed from a lateral approach. The subcutaneous tissues encloses 0 until on diet Vicryl suture, and the skin with staples. A dressing of xerofoam 4 x 4 dressing sponges web roll and Ace wrap was applied. At this time the tourniquet was let down to the left leg.  Description of procedure right trigger thumb release. The right hand and wrist wrapped with an Esmarch bandage and the tourniquet inflated to 250 mm of mercury. A transverse incision was made over the A1 pulley about a centimeter to centimeter and a half and like just to the skin, being careful to avoid the neurovascular bundles. We then visualized and incised the A1 pulley longitudinally and removed a small section. The thumb was then taken through a range of motion and no further triggering was noted.  This tourniquet was let down small bleeders identified and cauterized. The skin only was closed with running 4-0 nylon suture. A dressing of Xerofoam, 2 4 x 4 dressing sponges, 2 web roll, and a three-inch Ace wrap was then  applied. At this point the patient was awakened extubated and taken to the recovery without difficulty.

## 2012-01-12 NOTE — Progress Notes (Signed)
Clinical Social Work-CSW met with pt at bedside-pt relays she would like to d/c home with home health-husband and daughter who lives next door-No further needs at this time-CSW signing off unless re consulted.Jodean Lima, [phone]

## 2012-01-12 NOTE — Progress Notes (Signed)
ANTICOAGULATION CONSULT NOTE - Initial Consult  Pharmacy Consult for coumadin Indication: VTE prophylaxis  Allergies  Allergen Reactions  . Penicillins     REACTION: hives  . Sulfonamide Derivatives Hives    Patient Measurements:  91.8 kg  Vital Signs: Temp: 97 F (36.1 C) (03/18 1014) Temp src: Oral (03/18 0607) BP: 110/67 mmHg (03/18 1014) Pulse Rate: 57  (03/18 1014)  Labs: No results found for this basename: HGB:2,HCT:3,PLT:3,APTT:3,LABPROT:3,INR:3,HEPARINUNFRC:3,CREATININE:3,CKTOTAL:3,CKMB:3,TROPONINI:3 in the last 72 hours The CrCl is unknown because both a height and weight (above a minimum accepted value) are required for this calculation.  Medical History: Past Medical History  Diagnosis Date  . Osteopenia   . Hyperlipidemia   . Hypertension   . PSVT (paroxysmal supraventricular tachycardia)   . Spinal stenosis   . Overactive bladder     urge incontinence    Medications:  Prescriptions prior to admission  Medication Sig Dispense Refill  . aspirin 325 MG tablet Take 325 mg by mouth daily.       Marland Kitchen atenolol (TENORMIN) 50 MG tablet TAKE 1 TABLET DAILY  90 tablet  1  . Calcium Carbonate-Vitamin D (CALCIUM-VITAMIN D) 600-200 MG-UNIT CAPS Take 1 capsule by mouth 3 (three) times daily.       Marland Kitchen DETROL LA 4 MG 24 hr capsule TAKE 1 CAPSULE ONCE DAILY  90 capsule  3  . diphenhydrAMINE (BENADRYL) 25 MG tablet Take 25 mg by mouth daily as needed.       . hydrochlorothiazide (HYDRODIURIL) 25 MG tablet TAKE 1 TABLET DAILY  90 tablet  1  . Multiple Vitamins-Iron (MULTIVITAMIN/IRON PO) Take 1 tablet by mouth daily.       . Naproxen Sodium (ALEVE) 220 MG CAPS Take 1 capsule by mouth 2 (two) times daily as needed.       . potassium chloride (K-DUR,KLOR-CON) 10 MEQ tablet TAKE 1 TABLET DAILY  90 tablet  2    Assessment: 78 yo lady s/p revision left TKA to start coumadin for DVT px. Goal of Therapy:  INR 2-3   Plan:  Coumadin 6 mg today Daily protime/INR.  Jameika Kinn  [place] 01/12/2012,10:41 AM

## 2012-01-12 NOTE — Progress Notes (Signed)
Orthopedic Tech Progress Note Patient Details:  [name] 06/13/1934 [id number]  CPM Left Knee CPM Left Knee: On Left Knee Flexion (Degrees): 30  Left Knee Extension (Degrees): 0    [name] T 01/12/2012, 9:49 AM

## 2012-01-12 NOTE — Transfer of Care (Signed)
Immediate Anesthesia Transfer of Care Note  Patient: [name]  Procedure(s) Performed: Procedure(s) (LRB): TOTAL KNEE REVISION (Left) RELEASE TRIGGER FINGER/A-1 PULLEY (Right)  Patient Location: PACU  Anesthesia Type: GA combined with regional for post-op pain  Level of Consciousness: awake, oriented, sedated, patient cooperative and responds to stimulation  Airway & Oxygen Therapy: Patient Spontanous Breathing and Patient connected to nasal cannula oxygen  Post-op Assessment: Report given to PACU RN, Post -op Vital signs reviewed and stable and Patient moving all extremities  Post vital signs: Reviewed and stable  Complications: No apparent anesthesia complications

## 2012-01-12 NOTE — Anesthesia Procedure Notes (Signed)
Anesthesia Regional Block:  Femoral nerve block  Pre-Anesthetic Checklist: ,, timeout performed, Correct Patient, Correct Site, Correct Laterality, Correct Procedure,, site marked, risks and benefits discussed, Surgical consent, Pre-op evaluation,  At surgeon's request  Laterality: Left  Prep: chloraprep       Needles:  Injection technique: Single-shot      Needle Gauge: 22 and 22 G    Additional Needles:  Procedures: nerve stimulator Femoral nerve block  Nerve Stimulator or Paresthesia:  Response: 0.4 mA,   Additional Responses:   Narrative:  Start time: 01/12/2012 7:10 AM End time: 01/12/2012 7:20 AM Injection made incrementally with aspirations every 5 mL.  Performed by: Personally  Anesthesiologist: Arta Bruce MD  Additional Notes: A functioning IV was confirmed and monitors were applied.  Sterile prep and drape, hand hygiene and sterile gloves were used.  Negative aspiration prior to incremental administration of local anesthetic using the 22 ga needle. The patient tolerated the procedure well.   Femoral nerve block

## 2012-01-12 NOTE — Interval H&P Note (Signed)
History and Physical Interval Note:  01/12/2012 7:14 AM  [name]  has presented today for surgery, with the diagnosis of DISLOCATED LEFT PATELLA/TKA & RIGHT TRIGGER THUMB  The various methods of treatment have been discussed with the patient and family. After consideration of risks, benefits and other options for treatment, the patient has consented to  Procedure(s) (LRB): TOTAL KNEE REVISION (Left) RELEASE TRIGGER FINGER/A-1 PULLEY (Right) as a surgical intervention .  The patients' history has been reviewed, patient examined, no change in status, stable for surgery.  I have reviewed the patients' chart and labs.  Questions were answered to the patient's satisfaction.     [name]

## 2012-01-12 NOTE — Plan of Care (Signed)
Problem: Consults Goal: Diagnosis- Total Joint Replacement Outcome: Completed/Met Date Met:  01/12/12 Revision Total Knee Left

## 2012-01-12 NOTE — Anesthesia Preprocedure Evaluation (Addendum)
Anesthesia Evaluation  Patient identified by MRN, date of birth, ID band Patient awake    Reviewed: Allergy & Precautions, H&P , NPO status , Patient's Chart, lab work & pertinent test results  Airway Mallampati: I TM Distance: >3 FB Neck ROM: Full    Dental  (+) Partial Upper, Caps and Dental Advidsory Given   Pulmonary neg pulmonary ROS,          Cardiovascular hypertension, On Home Beta Blockers + dysrhythmias Supra Ventricular Tachycardia     Neuro/Psych negative neurological ROS  negative psych ROS   GI/Hepatic negative GI ROS, Neg liver ROS,   Endo/Other  negative endocrine ROS  Renal/GU negative Renal ROS  negative genitourinary   Musculoskeletal   Abdominal   Peds  Hematology negative hematology ROS (+)   Anesthesia Other Findings   Reproductive/Obstetrics negative OB ROS                          Anesthesia Physical Anesthesia Plan  ASA: III  Anesthesia Plan: General and Regional   Post-op Pain Management: MAC Combined w/ Regional for Post-op pain   Induction: Intravenous  Airway Management Planned: Oral ETT  Additional Equipment:   Intra-op Plan:   Post-operative Plan: Extubation in OR  Informed Consent: I have reviewed the patients History and Physical, chart, labs and discussed the procedure including the risks, benefits and alternatives for the proposed anesthesia with the patient or authorized representative who has indicated his/her understanding and acceptance.   Dental advisory given  Plan Discussed with: CRNA and Surgeon  Anesthesia Plan Comments:        Anesthesia Quick Evaluation

## 2012-01-13 ENCOUNTER — Encounter (HOSPITAL_COMMUNITY): Payer: Self-pay | Admitting: Orthopedic Surgery

## 2012-01-13 LAB — PROTIME-INR: INR: 1.07 (ref 0.00–1.49)

## 2012-01-13 LAB — CBC
Hemoglobin: 12.5 g/dL (ref 12.0–15.0)
MCH: 30.3 pg (ref 26.0–34.0)
MCHC: 32.1 g/dL (ref 30.0–36.0)

## 2012-01-13 LAB — BASIC METABOLIC PANEL
Calcium: 8.8 mg/dL (ref 8.4–10.5)
GFR calc non Af Amer: 80 mL/min — ABNORMAL LOW (ref >90)
Glucose, Bld: 124 mg/dL — ABNORMAL HIGH (ref 70–99)
Sodium: 140 mEq/L (ref 135–145)

## 2012-01-13 MED ORDER — WARFARIN SODIUM 5 MG PO TABS
5.0000 mg | ORAL_TABLET | Freq: Once | ORAL | Status: AC
  Administered 2012-01-13: 5 mg via ORAL
  Filled 2012-01-13: qty 1

## 2012-01-13 NOTE — Progress Notes (Signed)
Orthopedic Tech Progress Note Patient Details:  [name] 11-10-1933 [id number]  Other Ortho Devices Type of Ortho Device: Knee Immobilizer Ortho Device Interventions: Application   Cammer, Mickie Bail 01/13/2012, 10:47 AM Knee immobilizer

## 2012-01-13 NOTE — Progress Notes (Signed)
Physical Therapy Evaluation Patient Details Name: [name] MRN: [id number] DOB: [birth year] Today's Date: 01/13/2012  Problem List:  Patient Active Problem List  Diagnoses  . HYPERCHOLESTEROLEMIA, PURE  . HYPOKALEMIA  . DECREASED WHITE BLOOD CELL COUNT NOS  . HYPERTENSION, ESSENTIAL NOS  . PSVT  . SCLERODERMA  . DEGENERATIVE JOINT DISEASE  . SPINAL STENOSIS, LUMBAR  . OSTEOPENIA  . PES PLANUS  . SPONDYLOLISTHESIS  . Cough  . URINARY FREQUENCY, CHRONIC  . ALLERGY  . Ingrown hair  . Painful total knee replacement, dislocating patella    Past Medical History:  Past Medical History  Diagnosis Date  . Osteopenia   . Hyperlipidemia   . Hypertension   . PSVT (paroxysmal supraventricular tachycardia)   . Spinal stenosis   . Overactive bladder     urge incontinence   Past Surgical History:  Past Surgical History  Procedure Date  . Cataract extraction 2008  . Bladder suspension 1993  . Joint replacement 2000    left TKR    PT Assessment/Plan/Recommendation PT Assessment Clinical Impression Statement: 78 yo female s/p LTKA revision and Rtrigger thumb release presents with decr functional mobility; will benefit from acute PT to maximize independence and safety with mobility, amb, steps, therex, to enable safe dc home PT Recommendation/Assessment: Patient will need skilled PT in the acute care venue PT Problem List: Decreased strength;Decreased range of motion;Decreased activity tolerance;Decreased coordination;Decreased knowledge of use of DME;Pain;Decreased knowledge of precautions PT Therapy Diagnosis : Difficulty walking;Acute pain PT Plan PT Frequency: 7X/week PT Treatment/Interventions: DME instruction;Gait training;Stair training;Functional mobility training;Therapeutic activities;Therapeutic exercise;Patient/family education PT Recommendation Recommendations for Other Services: OT consult Follow Up Recommendations: Home health PT;Supervision/Assistance - 24  hour Equipment Recommended: None recommended by PT PT Goals  Acute Rehab PT Goals PT Goal Formulation: With patient Time For Goal Achievement: 7 days Pt will go Supine/Side to Sit: with modified independence PT Goal: Supine/Side to Sit - Progress: Goal set today Pt will go Sit to Supine/Side: with modified independence PT Goal: Sit to Supine/Side - Progress: Goal set today Pt will go Sit to Stand: with supervision PT Goal: Sit to Stand - Progress: Goal set today Pt will go Stand to Sit: with supervision PT Goal: Stand to Sit - Progress: Goal set today Pt will Ambulate: >150 feet;with supervision;with rolling walker PT Goal: Ambulate - Progress: Goal set today Pt will Go Up / Down Stairs: 3-5 stairs;with rail(s);with supervision PT Goal: Up/Down Stairs - Progress: Goal set today Pt will Perform Home Exercise Program: with supervision, verbal cues required/provided PT Goal: Perform Home Exercise Program - Progress: Goal set today  PT Evaluation Precautions/Restrictions  Precautions Precautions: Knee Precaution Comments: Do not run CPM over 80 deg of flexion Required Braces or Orthoses: Yes Knee Immobilizer: On when out of bed or walking Restrictions Weight Bearing Restrictions: Yes LLE Weight Bearing: Weight bearing as tolerated Prior Functioning  Home Living Lives With: Spouse Receives Help From: Family Type of Home: House Home Layout: One level Home Access: Stairs to enter Entrance Stairs-Rails: Doctor, general practice of Steps: 4 Bathroom Shower/Tub: Health visitor: Handicapped height Bathroom Accessibility: Yes How Accessible: Accessible via walker Home Adaptive Equipment: Walker - rolling Prior Function Level of Independence: Independent with homemaking with ambulation Cognition Cognition Arousal/Alertness: Awake/alert Overall Cognitive Status: Appears within functional limits for tasks assessed Orientation Level: Oriented  X4 Sensation/Coordination Sensation Light Touch: Appears Intact Coordination Gross Motor Movements are Fluid and Coordinated: Yes Fine Motor Movements are Fluid and Coordinated: Yes Extremity  Assessment RUE Assessment RUE Assessment: Within Functional Limits (hand/thumb wrapped) LUE Assessment LUE Assessment: Within Functional Limits RLE Assessment RLE Assessment: Within Functional Limits LLE Assessment LLE Assessment:  (very good quad activation; grossly decr ROM, limited by pain postop) Mobility (including Balance) Bed Mobility Bed Mobility: Yes Supine to Sit: 4: Min assist Supine to Sit Details (indicate cue type and reason): Min assist for LLE; cues for technique Sit to Supine: 4: Min assist Sit to Supine - Details (indicate cue type and reason): cues for technique, safety Transfers Transfers: Yes Sit to Stand: 4: Min assist (guard assist without physical contact) Sit to Stand Details (indicate cue type and reason): cues for technique, hand placement, positioning of LLE with KI Stand to Sit: 4: Min assist;To chair/3-in-1;With upper extremity assist Stand to Sit Details: cues for hand placement, control, positioning of LLE in KI Ambulation/Gait Ambulation/Gait: Yes Ambulation/Gait Assistance: 4: Min assist Ambulation/Gait Assistance Details (indicate cue type and reason): cues for gait sequence; to activate L quad in stance Ambulation Distance (Feet): 80 Feet Assistive device: Rolling walker (cued to minimize WB through R thumb) Gait Pattern: Step-through pattern (Emerging)     End of Session PT - End of Session Equipment Utilized During Treatment: Gait belt Activity Tolerance: Patient tolerated treatment well Patient left: with call bell in reach (on commode in bathroom, nsg aware) Nurse Communication: Mobility status for ambulation General Behavior During Session: [facility] for tasks performed Cognition: [facility] for tasks performed  Van Clines [facility] [place], [place]  [phone]  01/13/2012, 1:10 PM

## 2012-01-13 NOTE — Progress Notes (Signed)
Physical Therapy Treatment Patient Details Name: [name] MRN: [id number] DOB: [birth year] Today's Date: 01/13/2012  PT Assessment/Plan  PT - Assessment/Plan Comments on Treatment Session: On track for dc home tomorrow; will plan to do stair training next session PT Plan: Discharge plan remains appropriate PT Frequency: 7X/week Follow Up Recommendations: Home health PT;Supervision/Assistance - 24 hour Equipment Recommended: None recommended by PT PT Goals  Acute Rehab PT Goals Time For Goal Achievement: 7 days Pt will go Supine/Side to Sit: with modified independence PT Goal: Supine/Side to Sit - Progress: Progressing toward goal Pt will go Sit to Stand: with supervision PT Goal: Sit to Stand - Progress: Progressing toward goal Pt will go Stand to Sit: with supervision PT Goal: Stand to Sit - Progress: Progressing toward goal Pt will Ambulate: >150 feet;with supervision;with rolling walker PT Goal: Ambulate - Progress: Progressing toward goal Pt will Perform Home Exercise Program: with supervision, verbal cues required/provided PT Goal: Perform Home Exercise Program - Progress: Progressing toward goal  PT Treatment Precautions/Restrictions  Precautions Precautions: Knee Precaution Comments: Do not run CPM over 80 deg of flexion Required Braces or Orthoses: Yes Knee Immobilizer: On when out of bed or walking Restrictions Weight Bearing Restrictions: Yes LLE Weight Bearing: Weight bearing as tolerated Mobility (including Balance) Bed Mobility Supine to Sit: 4: Min assist Supine to Sit Details (indicate cue type and reason): physical assist for LLE; slower moving, but still doing well Transfers Sit to Stand: 4: Min assist;From bed;From chair/3-in-1 (Guard assist without physical contact) Sit to Stand Details (indicate cue type and reason): cues for technique, safety, positioning Stand to Sit: 4: Min assist;To chair/3-in-1 (Guard assist without physical contact) Stand to  Sit Details: cies for technique and positioning Ambulation/Gait Ambulation/Gait Assistance: 4: Min assist (guard assist without physical contact) Ambulation/Gait Assistance Details (indicate cue type and reason): improving, with no noed knee buckle in Left stance Ambulation Distance (Feet): 35 Feet Assistive device: Rolling walker Gait Pattern: Step-through pattern    Exercise  Total Joint Exercises Quad Sets: AROM;Left;10 reps;Supine Short Arc Quad: AAROM;Left;10 reps;Supine Heel Slides: AAROM;Left;10 reps;Supine Straight Leg Raises: AAROM;Left;10 reps;Supine  Need to work on [employer] of knee End of Session PT - End of Session Equipment Utilized During Treatment: Gait belt Activity Tolerance: Patient tolerated treatment well Patient left: in chair;with call bell in reach General Behavior During Session: [facility] for tasks performed Cognition: [facility] for tasks performed  [name], [place] [phone]  01/13/2012, 5:33 PM

## 2012-01-13 NOTE — Progress Notes (Signed)
ANTICOAGULATION CONSULT NOTE - Initial Consult  Pharmacy Consult for coumadin Indication: VTE prophylaxis  Allergies  Allergen Reactions  . Penicillins     REACTION: hives  . Sulfonamide Derivatives Hives    Patient Measurements:  91.8 kg  Vital Signs: Temp: 97.4 F (36.3 C) (03/19 0528) Temp src: Oral (03/19 0528) BP: 110/64 mmHg (03/19 1000) Pulse Rate: 68  (03/19 1000)  Labs:  Basename 01/13/12 0515  HGB 12.5  HCT 38.9  PLT 184  APTT --  LABPROT 14.1  INR 1.07  HEPARINUNFRC --  CREATININE 0.76  CKTOTAL --  CKMB --  TROPONINI --   The CrCl is unknown because both a height and weight (above a minimum accepted value) are required for this calculation.  Medical History: Past Medical History  Diagnosis Date  . Osteopenia   . Hyperlipidemia   . Hypertension   . PSVT (paroxysmal supraventricular tachycardia)   . Spinal stenosis   . Overactive bladder     urge incontinence    Medications:  Prescriptions prior to admission  Medication Sig Dispense Refill  . aspirin 325 MG tablet Take 325 mg by mouth daily.       Marland Kitchen atenolol (TENORMIN) 50 MG tablet TAKE 1 TABLET DAILY  90 tablet  1  . Calcium Carbonate-Vitamin D (CALCIUM-VITAMIN D) 600-200 MG-UNIT CAPS Take 1 capsule by mouth 3 (three) times daily.       Marland Kitchen DETROL LA 4 MG 24 hr capsule TAKE 1 CAPSULE ONCE DAILY  90 capsule  3  . diphenhydrAMINE (BENADRYL) 25 MG tablet Take 25 mg by mouth daily as needed.       . hydrochlorothiazide (HYDRODIURIL) 25 MG tablet TAKE 1 TABLET DAILY  90 tablet  1  . Multiple Vitamins-Iron (MULTIVITAMIN/IRON PO) Take 1 tablet by mouth daily.       . Naproxen Sodium (ALEVE) 220 MG CAPS Take 1 capsule by mouth 2 (two) times daily as needed.       . potassium chloride (K-DUR,KLOR-CON) 10 MEQ tablet TAKE 1 TABLET DAILY  90 tablet  2    Assessment: 78 yo female POD#1 s/p revision left TKA on coumadin for DVT px. INR 1.07 today. No s/sx of bleeding noted.   Goal of Therapy:  INR 1.5-2  per Dr. Wadie Lessen note   Plan:  Coumadin 5 mg today Daily protime/INR.  Arman Filter, RPh 01/13/2012,12:51 PM

## 2012-01-13 NOTE — Progress Notes (Signed)
Patient ID: [name], female   DOB: 1934-09-25, 78 y.o.   MRN: [id number] PATIENT ID: [name]  MRN: [id number]  DOB/AGE:  11/08/1933 / 78 y.o.  1 Day Post-Op Procedure(s) (LRB): TOTAL KNEE REVISION (Left) RELEASE TRIGGER FINGER/A-1 PULLEY (Right)    PROGRESS NOTE Subjective: Patient is alert, oriented, no Nausea, no Vomiting, yes} passing gas, no Bowel Movement. Taking PO well. Denies SOB, Chest or Calf Pain. Using Incentive Spirometer, PAS in place. Ambulate WBAT, CPM 0-30, do not exceed 80 Patient reports pain as 4 on 0-10 scale  .    Objective: Vital signs in last 24 hours: Filed Vitals:   01/12/12 1344 01/12/12 2131 01/13/12 0132 01/13/12 0528  BP: 111/56 109/49 112/51 104/54  Pulse: 56 63 61 64  Temp: 97.1 F (36.2 C) 98.4 F (36.9 C) 97.5 F (36.4 C) 97.4 F (36.3 C)  TempSrc:  Oral Oral Oral  Resp: 16 18 18 18   SpO2: 100% 96% 96% 97%      Intake/Output from previous day: I/O last 3 completed shifts: In: 2792.1 [P.O.:240; I.V.:2552.1] Out: 3600 [Urine:3550; Drains:50]   Intake/Output this shift:     LABORATORY DATA:  Basename 01/13/12 0515  WBC 8.7  HGB 12.5  HCT 38.9  PLT 184  NA 140  K 3.6  CL 102  CO2 30  BUN 15  CREATININE 0.76  GLUCOSE 124*  GLUCAP --  INR 1.07  CALCIUM 8.8    Examination: Neurologically intact ABD soft Neurovascular intact Sensation intact distally Intact pulses distally Dorsiflexion/Plantar flexion intact Incision: dressing C/D/I and no drainage No cellulitis present Compartment soft} Blood and plasma separated in drain indicating minimal recent drainage, drain pulled without difficulty. Thumb FROM no trigger Assessment:   1 Day Post-Op Procedure(s) (LRB): TOTAL KNEE REVISION (Left) RELEASE TRIGGER FINGER/A-1 PULLEY (Right) ADDITIONAL DIAGNOSIS:    Plan: PT/OT WBAT, CPM 5/hrs day until ROM 0-80 degrees, then D/C CPM, do not exceed 80 degrees. Knee immob when OOB DVT Prophylaxis:  Lovenox\Coumadin bridge  target INR 1.5-2.0 DISCHARGE PLAN: Home DISCHARGE NEEDS: HHPT, HHRN, CPM, Walker and 3-in-1 comode seat     Saliah Crisp J 01/13/2012, 7:27 AM

## 2012-01-13 NOTE — Progress Notes (Signed)
Orthopedic Tech Progress Note Patient Details:  [name] [birth year] [id number]  Other Ortho Devices Type of Ortho Device: Knee Immobilizer Ortho Device Interventions: Application   Cammer, Mickie Bail 01/13/2012, 10:47 AM

## 2012-01-13 NOTE — Progress Notes (Signed)
UR COMPLETED  

## 2012-01-13 NOTE — Progress Notes (Signed)
OT Screen  OT order received. Chart reviewed. Pt has had knee surgery before and has nec help at home after D/C. Both husband and daughter plan to assist. No OT needs at this time. OT signing off. [facility], OTR/L  [phone] 01/13/2012

## 2012-01-14 LAB — PROTIME-INR: INR: 1.21 (ref 0.00–1.49)

## 2012-01-14 LAB — CBC
HCT: 39.4 % (ref 36.0–46.0)
MCHC: 33 g/dL (ref 30.0–36.0)
MCV: 93.1 fL (ref 78.0–100.0)
RDW: 13.7 % (ref 11.5–15.5)

## 2012-01-14 MED ORDER — WARFARIN SODIUM 5 MG PO TABS
5.0000 mg | ORAL_TABLET | Freq: Once | ORAL | Status: AC
  Administered 2012-01-14: 5 mg via ORAL
  Filled 2012-01-14: qty 1

## 2012-01-14 NOTE — Discharge Instructions (Signed)
Home Health to be provided by [facility] [phone]

## 2012-01-14 NOTE — Progress Notes (Signed)
PATIENT ID: [name]  MRN: [id number]  DOB/AGE:  76-02-1934 / 78 y.o.  2 Days Post-Op Procedure(s) (LRB): TOTAL KNEE REVISION (Left) RELEASE TRIGGER FINGER/A-1 PULLEY (Right)    PROGRESS NOTE Subjective: Patient is alert, oriented, no Nausea, no Vomiting, yes} passing gas, no Bowel Movement. Taking PO well. Denies SOB, Chest or Calf Pain. Using Incentive Spirometer, PAS in place. Ambulating well with PT. Patient reports pain as moderate  .    Objective: Vital signs in last 24 hours: Filed Vitals:   01/13/12 1300 01/13/12 1415 01/13/12 2133 01/14/12 0527  BP:  110/68 125/58 112/56  Pulse:  67 74 80  Temp:  97.4 F (36.3 C) 99.4 F (37.4 C) 99.1 F (37.3 C)  TempSrc:   Oral   Resp:  18 18 20   Height: 5\' 6"  (1.676 m)     Weight: 91.8 kg (202 lb 6.1 oz)     SpO2:  94% 96% 96%      Intake/Output from previous day: I/O last 3 completed shifts: In: 2302.5 [P.O.:240; I.V.:2062.5] Out: 1500 [Urine:1450; Drains:50]   Intake/Output this shift:     LABORATORY DATA:  Basename 01/14/12 0550 01/13/12 0515  WBC 11.3* 8.7  HGB 13.0 12.5  HCT 39.4 38.9  PLT 194 184  NA -- 140  K -- 3.6  CL -- 102  CO2 -- 30  BUN -- 15  CREATININE -- 0.76  GLUCOSE -- 124*  GLUCAP -- --  INR 1.21 1.07  CALCIUM -- 8.8    Examination: Neurologically intact ABD soft Neurovascular intact Sensation intact distally Intact pulses distally Dorsiflexion/Plantar flexion intact Incision: dressing C/D/I}  Assessment:   2 Days Post-Op Procedure(s) (LRB): TOTAL KNEE REVISION (Left) RELEASE TRIGGER FINGER/A-1 PULLEY (Right) ADDITIONAL DIAGNOSIS:  none  Plan: PT/OT WBAT, CPM 5/hrs day until ROM 0-90 degrees, then D/C CPM DVT Prophylaxis:  Lovenox\Coumadin bridge target INR 1.5-2.0 DISCHARGE PLAN: Home Thursday DISCHARGE NEEDS: HHPT, HHRN, CPM, Walker and 3-in-1 comode seat     Mendy Lapinsky M. 01/14/2012, 8:33 AM

## 2012-01-14 NOTE — Progress Notes (Signed)
CARE MANAGEMENT NOTE 01/14/2012       Action/Plan:   Spoke with patient  on 01/13/12- Choice offered. Pt preoperatively setup with [facility], no changes. Has rolling walker and 3in1, CPM to be delivered.   Anticipated DC Date:  01/15/2012   Anticipated DC Plan:  HOME W HOSPICE CARE      DC Planning Services  CM consult      [facility] Choice  HOME HEALTH   Choice offered to / List presented to:  C-1 Patient        HH arranged  HH-1 RN  HH-2 PT      [facility] agency  [facility]   Status of service:  Completed, signed off Discharge Disposition:  HOME W HOME HEALTH SERVICES

## 2012-01-14 NOTE — Progress Notes (Signed)
Physical Therapy Treatment Patient Details Name: [name] MRN: [id number] DOB: [birth year] Today's Date: 01/14/2012  PT Assessment/Plan  PT - Assessment/Plan Comments on Treatment Session: Much more difficulty with mobility this session; slower moving, and distractable; noted for dc tomorrow, and this therapist agrees more PT sessions will be beneficial PT Plan: Discharge plan remains appropriate PT Frequency: 7X/week Follow Up Recommendations: Home health PT;Supervision/Assistance - 24 hour Equipment Recommended: None recommended by PT PT Goals  Acute Rehab PT Goals Time For Goal Achievement: 7 days Pt will go Supine/Side to Sit: with modified independence PT Goal: Supine/Side to Sit - Progress: Not progressing Pt will go Sit to Stand: with supervision PT Goal: Sit to Stand - Progress: Not progressing Pt will go Stand to Sit: with supervision PT Goal: Stand to Sit - Progress: Progressing toward goal Pt will Ambulate: >150 feet;with supervision;with rolling walker Pt will Go Up / Down Stairs: 3-5 stairs;with rail(s);with supervision PT Goal: Up/Down Stairs - Progress: Progressing toward goal  PT Treatment Precautions/Restrictions  Precautions Precautions: Knee Precaution Comments: Do not run CPM over 80 deg of flexion Required Braces or Orthoses: Yes Knee Immobilizer: On when out of bed or walking Restrictions Weight Bearing Restrictions: Yes LLE Weight Bearing: Weight bearing as tolerated Mobility (including Balance) Bed Mobility Supine to Sit: 3: Mod assist Supine to Sit Details (indicate cue type and reason): Attempted to perform bed mobility with less physical assist today (to maximize independence in prep for dc home), however pt with more difficulty today unwieghing hips with RLE to scoot/half bridge to EOB, and difficulty lifting trunk through elbow prop to hand propping while coming to a sitting position Transfers Sit to Stand: 3: Mod assist;From bed;With upper  extremity assist Sit to Stand Details (indicate cue type and reason): Dependent on momentum this am Stand to Sit: 4: Min assist;With upper extremity assist;To chair/3-in-1 Stand to Sit Details: cues for control Ambulation/Gait Ambulation/Gait: Yes Ambulation/Gait Assistance: 4: Min assist Ambulation/Gait Assistance Details (indicate cue type and reason): cues for sequence; much slower moving today; reports not feeling as well, like she is "bloated" or "has to burp"; RN notified Ambulation Distance (Feet): 60 Feet Assistive device: Rolling walker Gait Pattern: Step-through pattern Gait velocity: shorter steps, much slower Stairs: Yes Stairs Assistance: 4: Min assist Stairs Assistance Details (indicate cue type and reason): Verbal and demo cues for technique, sequence, which rail is better to use, and to activate L quad for stability when advancing RLE; pt was distracted, unable to recall technique that was just demonstrated to her Stair Management Technique: One rail Left;Sideways Number of Stairs: 2     Exercise  Total Joint Exercises Quad Sets: AROM;Left;10 reps;Supine End of Session PT - End of Session Equipment Utilized During Treatment: Gait belt;Left knee immobilizer Activity Tolerance: Patient limited by fatigue (VSS post amb) Patient left: in chair;with call bell in reach Nurse Communication: Mobility status for ambulation General Behavior During Session: [facility] for tasks performed Cognition:  (noted memory/attention difficulty with stair trng)  Van Clines [place], [place] [phone]  01/14/2012, 2:06 PM

## 2012-01-15 LAB — BODY FLUID CULTURE

## 2012-01-15 LAB — CBC
HCT: 38.4 % (ref 36.0–46.0)
MCH: 30.5 pg (ref 26.0–34.0)
MCHC: 33.1 g/dL (ref 30.0–36.0)
MCV: 92.1 fL (ref 78.0–100.0)
RDW: 13.4 % (ref 11.5–15.5)

## 2012-01-15 MED ORDER — METHOCARBAMOL 500 MG PO TABS: 500.0000 mg | ORAL_TABLET | Freq: Four times a day (QID) | ORAL | Status: DC | PRN

## 2012-01-15 MED ORDER — WARFARIN SODIUM 5 MG PO TABS: 5.0000 mg | ORAL_TABLET | Freq: Every day | ORAL | Status: DC

## 2012-01-15 MED ORDER — OXYCODONE-ACETAMINOPHEN 5-325 MG PO TABS: 1.0000 | ORAL_TABLET | ORAL | Status: DC | PRN

## 2012-01-15 NOTE — Progress Notes (Signed)
PATIENT ID: [name]  MRN: [id number]  DOB/AGE:  January 29, 1934 / 78 y.o.  3 Days Post-Op Procedure(s) (LRB): TOTAL KNEE REVISION (Left) RELEASE TRIGGER FINGER/A-1 PULLEY (Right)    PROGRESS NOTE Subjective: Patient is alert, oriented, no Nausea, no Vomiting, yes} passing gas, no Bowel Movement. Taking PO well. Denies SOB, Chest or Calf Pain. Using Incentive Spirometer, PAS in place. Ambulating well with PT. Patient reports pain as moderate  .    Objective: Vital signs in last 24 hours: Filed Vitals:   01/14/12 1500 01/14/12 1747 01/14/12 2134 01/15/12 0520  BP: 121/64  120/75 161/72  Pulse: 71  62 66  Temp: 98.3 F (36.8 C)  98.7 F (37.1 C) 97.9 F (36.6 C)  TempSrc: Oral   Oral  Resp: 18  18 18   Height:      Weight:      SpO2: 89% 94% 92% 96%      Intake/Output from previous day:     Intake/Output this shift:     LABORATORY DATA:  Basename 01/15/12 0555 01/14/12 0550 01/13/12 0515  WBC 8.2 11.3* --  HGB 12.7 13.0 --  HCT 38.4 39.4 --  PLT 187 194 --  NA -- -- 140  K -- -- 3.6  CL -- -- 102  CO2 -- -- 30  BUN -- -- 15  CREATININE -- -- 0.76  GLUCOSE -- -- 124*  GLUCAP -- -- --  INR 1.24 1.21 --  CALCIUM -- -- 8.8    Examination: Neurologically intact ABD soft Neurovascular intact Sensation intact distally Intact pulses distally Dorsiflexion/Plantar flexion intact Incision: dressing C/D/I}  Assessment:   3 Days Post-Op Procedure(s) (LRB): TOTAL KNEE REVISION (Left) RELEASE TRIGGER FINGER/A-1 PULLEY (Right) ADDITIONAL DIAGNOSIS:  none  Plan: PT/OT WBAT, CPM 5/hrs day until ROM 0-90 degrees, then D/C CPM DVT Prophylaxis:  Lovenox\Coumadin bridge target INR 1.5-2.0 DISCHARGE PLAN: Home today DISCHARGE NEEDS: HHPT, HHRN, CPM, Walker and 3-in-1 comode seat     [name] M. 01/15/2012, 8:01 AM

## 2012-01-15 NOTE — Progress Notes (Signed)
PT TREATMENT NOTE  01/15/12 1040  PT Visit Information  Last PT Received On 01/15/12  Precautions  Precautions Knee  Precaution Comments Do not run CPM over 80 deg of flexion  Required Braces or Orthoses Yes  Knee Immobilizer On when out of bed or walking  Restrictions  Weight Bearing Restrictions Yes  LLE Weight Bearing WBAT  Bed Mobility  Bed Mobility No  Transfers  Transfers Yes  Sit to Stand 5: Supervision;From bed;With upper extremity assist  Sit to Stand Details (indicate cue type and reason) vc's for safety and technique  Stand to Sit 4: Min assist;With upper extremity assist;To chair/3-in-1  Stand to Sit Details vc's for technique  Ambulation/Gait  Ambulation/Gait Yes  Ambulation/Gait Assistance 5: Supervision  Ambulation/Gait Assistance Details (indicate cue type and reason) safety cues for use of RW  Ambulation Distance (Feet) 160 Feet  Assistive device Rolling walker  Gait Pattern Step-through pattern  Gait velocity shorter steps, much slower  Stairs Yes  Stairs Assistance 4: Min assist  Stairs Assistance Details (indicate cue type and reason) vc's for safe technique  Stair Management Technique One rail Left;Sideways  Number of Stairs 2   Exercises  Exercises Total Joint  Total Joint Exercises  Ankle Circles/Pumps AROM;Both;10 reps;Sidelying  Quad Sets AROM;Left;10 reps;Supine  Short Arc Quad AAROM;Left;10 reps;Supine  Heel Slides AAROM;Left;10 reps;Supine  Knee Flexion AAROM;Seated;5 reps;Other (comment) (0-75 AAROM)  PT - End of Session  Equipment Utilized During Treatment Gait belt;Left knee immobilizer  Activity Tolerance Patient limited by fatigue  Patient left in chair;with call bell in reach  Nurse Communication Mobility status for ambulation  General  Behavior During Session [facility] for tasks performed  Cognition [facility] for tasks performed  PT - Assessment/Plan  Comments on Treatment Session Mobility much improved thia am.  Pt. aware that she had  difficulty thinking yesterday and said "the fog lifted" about 4pm.  PT Plan Discharge plan remains appropriate  PT Frequency 7X/week  Recommendations for Other Services OT consult  Follow Up Recommendations Home health PT;Supervision/Assistance - 24 hour  Equipment Recommended None recommended by PT  Acute Rehab PT Goals  PT Goal: Sit to Stand - Progress Progressing toward goal  PT Goal: Stand to Sit - Progress Progressing toward goal  PT Goal: Ambulate - Progress Progressing toward goal  PT Goal: Up/Down Stairs - Progress Progressing toward goal  PT Goal: Perform Home Exercise Program - Progress Progressing toward goal   Weldon Picking PT Acute Rehab Services [phone] Beeper [phone]

## 2012-01-15 NOTE — Progress Notes (Signed)
Pt given D/C instruction and scripts. Pt being D/Cd home with husband and daughter.

## 2012-01-15 NOTE — Progress Notes (Signed)
ANTICOAGULATION CONSULT NOTE - Follow-Up Consult  Pharmacy Consult for coumadin Indication: VTE prophylaxis  Allergies  Allergen Reactions  . Penicillins     REACTION: hives  . Sulfonamide Derivatives Hives    Patient Measurements: Height: 5\' 6"  (167.6 cm) (from 01/05/12 preadmit) Weight: 202 lb 6.1 oz (91.8 kg) (from 01/05/12 preadmit) IBW/kg (Calculated) : 59.3   Vital Signs: Temp: 97.9 F (36.6 C) (03/21 0520) Temp src: Oral (03/21 0520) BP: 161/72 mmHg (03/21 0520) Pulse Rate: 66  (03/21 0520)  Labs:  Basename 01/15/12 0555 01/14/12 0550 01/13/12 0515  HGB 12.7 13.0 --  HCT 38.4 39.4 38.9  PLT 187 194 184  APTT -- -- --  LABPROT 15.9* 15.6* 14.1  INR 1.24 1.21 1.07  HEPARINUNFRC -- -- --  CREATININE -- -- 0.76  CKTOTAL -- -- --  CKMB -- -- --  TROPONINI -- -- --   Estimated Creatinine Clearance: 68.3 ml/min (by C-G formula based on Cr of 0.76).  Assessment: 78 yo female POD#3 s/p revision left TKA on coumadin for DVT px. INR 1.24 today-moving slowly. No s/sx of bleeding noted.   Goal of Therapy:  INR 1.5-2 per Dr. Wadie Lessen note   Plan:  Noted plan to send home on coumadin 5mg  po daily with INR f/u by [facility].  Christoper Fabian, PharmD, BCPS Clinical pharmacist, pager [phone] 01/15/2012,8:58 AM

## 2012-01-15 NOTE — Discharge Summary (Signed)
Patient ID: [name] MRN: [id number] DOB/AGE: Nov 25, 1933 78 y.o.  Admit date: 01/12/2012 Discharge date: 01/15/2012  Admission Diagnoses:  Principal Problem:  *Painful total knee replacement, dislocating patella   Discharge Diagnoses:  Same  Past Medical History  Diagnosis Date  . Osteopenia   . Hyperlipidemia   . Hypertension   . PSVT (paroxysmal supraventricular tachycardia)   . Spinal stenosis   . Overactive bladder     urge incontinence    Surgeries: Procedure(s): TOTAL KNEE REVISION RELEASE TRIGGER FINGER/A-1 PULLEY on 01/12/2012   Consultants:    Discharged Condition: Improved  Hospital Course: [name] is an 78 y.o. female who was admitted 01/12/2012 for operative treatment ofPainful total knee replacement. Patient has severe unremitting pain that affects sleep, daily activities, and work/hobbies. After pre-op clearance the patient was taken to the operating room on 01/12/2012 and underwent  Procedure(s): TOTAL KNEE REVISION RELEASE TRIGGER FINGER/A-1 PULLEY.    Patient was given perioperative antibiotics: Anti-infectives    None       Patient was given sequential compression devices, early ambulation, and chemoprophylaxis to prevent DVT.  Patient benefited maximally from hospital stay and there were no complications.    Recent vital signs: Patient Vitals for the past 24 hrs:  BP Temp Temp src Pulse Resp SpO2  01/15/12 0520 161/72 mmHg 97.9 F (36.6 C) Oral 66  18  96 %  Jan 19, 2012 2134 120/75 mmHg 98.7 F (37.1 C) - 62  18  92 %  2012/01/19 1747 - - - - - 94 %  01-19-12 1500 121/64 mmHg 98.3 F (36.8 C) Oral 71  18  89 %     Recent laboratory studies:  Basename 01/15/12 0555 2012/01/19 0550 01/13/12 0515  WBC 8.2 11.3* --  HGB 12.7 13.0 --  HCT 38.4 39.4 --  PLT 187 194 --  NA -- -- 140  K -- -- 3.6  CL -- -- 102  CO2 -- -- 30  BUN -- -- 15  CREATININE -- -- 0.76  GLUCOSE -- -- 124*  INR 1.24 1.21 --  CALCIUM -- -- 8.8     Discharge  Medications:   Medication List  As of 01/15/2012  8:04 AM   STOP taking these medications         ALEVE 220 MG Caps      aspirin 325 MG tablet         TAKE these medications         atenolol 50 MG tablet   Commonly known as: TENORMIN   TAKE 1 TABLET DAILY      Calcium-Vitamin D 600-200 MG-UNIT Caps   Take 1 capsule by mouth 3 (three) times daily.      DETROL LA 4 MG 24 hr capsule   Generic drug: tolterodine   TAKE 1 CAPSULE ONCE DAILY      diphenhydrAMINE 25 MG tablet   Commonly known as: BENADRYL   Take 25 mg by mouth daily as needed.      hydrochlorothiazide 25 MG tablet   Commonly known as: HYDRODIURIL   TAKE 1 TABLET DAILY      methocarbamol 500 MG tablet   Commonly known as: ROBAXIN   Take 1 tablet (500 mg total) by mouth every 6 (six) hours as needed.      MULTIVITAMIN/IRON PO   Take 1 tablet by mouth daily.      oxyCODONE-acetaminophen 5-325 MG per tablet   Commonly known as: PERCOCET   Take 1-2 tablets  by mouth every 4 (four) hours as needed.      potassium chloride 10 MEQ tablet   Commonly known as: K-DUR,KLOR-CON   TAKE 1 TABLET DAILY      warfarin 5 MG tablet   Commonly known as: COUMADIN   Take 1 tablet (5 mg total) by mouth daily.            Diagnostic Studies: Dg Chest 2 View  01/05/2012  *RADIOLOGY REPORT*  Clinical Data: Preop radiograph.  Dislocated left patella  CHEST - 2 VIEW  Comparison: 09/12/2011  Findings:  The heart size and mediastinal contours are within normal limits. Both lungs are clear. No change in chronic compression deformity within the lower thoracic spine.  IMPRESSION:  No active cardiopulmonary abnormalities.  Original Report Authenticated By: Rosealee Albee, M.D.    Disposition: Home with HHN for PT, wound care, coumadin  Discharge Orders    Future Orders Please Complete By Expires   Increase activity slowly      Walker       May shower / Bathe      Driving Restrictions      Comments:   No driving for 2 weeks.     Change dressing (specify)      Comments:   Dressing change as needed.   Call MD for:  temperature >100.4      Call MD for:  severe uncontrolled pain      Call MD for:  redness, tenderness, or signs of infection (pain, swelling, redness, odor or green/yellow discharge around incision site)      Discharge instructions      Comments:   F/U with Dr. Turner Daniels in 10 days         Signed: Hazle Nordmann. 01/15/2012, 8:04 AM

## 2012-01-17 LAB — ANAEROBIC CULTURE

## 2012-01-26 ENCOUNTER — Ambulatory Visit (INDEPENDENT_AMBULATORY_CARE_PROVIDER_SITE_OTHER): Payer: Medicare Other | Admitting: Family Medicine

## 2012-01-26 ENCOUNTER — Encounter: Payer: Self-pay | Admitting: Family Medicine

## 2012-01-26 ENCOUNTER — Telehealth: Payer: Self-pay

## 2012-01-26 VITALS — BP 120/70 | HR 64 | Temp 98.0°F | Wt 201.0 lb

## 2012-01-26 DIAGNOSIS — L0390 Cellulitis, unspecified: Secondary | ICD-10-CM

## 2012-01-26 DIAGNOSIS — R350 Frequency of micturition: Secondary | ICD-10-CM

## 2012-01-26 LAB — POCT URINALYSIS DIPSTICK
Glucose, UA: NEGATIVE
Nitrite, UA: NEGATIVE
Spec Grav, UA: 1.01
Urobilinogen, UA: NEGATIVE
pH, UA: 7

## 2012-01-26 MED ORDER — DOXYCYCLINE HYCLATE 100 MG PO TABS: 100.0000 mg | ORAL_TABLET | Freq: Two times a day (BID) | ORAL | Status: AC

## 2012-01-26 NOTE — Telephone Encounter (Signed)
[name] PT with [facility] said pt has temp 99.7 with feeling of fullness and urgency to urinate. Void frequently small amts with odor to urine. Kelli wants appt scheduled for pt. Pt is already scheduled to see Dr Dayton Martes today at 10:15am. [name] said pts daughter must have made appt.

## 2012-01-26 NOTE — Progress Notes (Signed)
Subjective:    Patient ID: [name], female    DOB: [birth year], 78 y.o.   MRN: [id number]  HPI 78 yo pt of Dr. Milinda Antis new to me with complicated medical history here for:  Urinary frequency- S/p TKR on 01/12/2012. Harvin Hazel PT with [facility] said pt has temp 99.7 with feeling of fullness and urgency to urinate. Void frequently small amts with odor to urine.   No blood in urine - but urine was more concentrated so she drank more fluids. No back pain, nausea or vomiting.  S/p TKR on 01/12/2012, feels leg is more warm past two days.  Of note, her husband died yesterday.  Patient Active Problem List  Diagnoses  . HYPERCHOLESTEROLEMIA, PURE  . HYPOKALEMIA  . DECREASED WHITE BLOOD CELL COUNT NOS  . HYPERTENSION, ESSENTIAL NOS  . PSVT  . SCLERODERMA  . DEGENERATIVE JOINT DISEASE  . SPINAL STENOSIS, LUMBAR  . OSTEOPENIA  . PES PLANUS  . SPONDYLOLISTHESIS  . Cough  . URINARY FREQUENCY, CHRONIC  . ALLERGY  . Ingrown hair  . Painful total knee replacement, dislocating patella   Past Medical History  Diagnosis Date  . Osteopenia   . Hyperlipidemia   . Hypertension   . PSVT (paroxysmal supraventricular tachycardia)   . Spinal stenosis   . Overactive bladder     urge incontinence   Past Surgical History  Procedure Date  . Cataract extraction 2008  . Bladder suspension 1993  . Joint replacement 2000    left TKR  . Total knee revision 01/12/2012    Procedure: TOTAL KNEE REVISION;  Surgeon: [name], MD;  Location: [facility];  Service: Orthopedics;  Laterality: Left;  DEPUY/ LCS , HAND SET  . Trigger finger release 01/12/2012    Procedure: RELEASE TRIGGER FINGER/A-1 PULLEY;  Surgeon: [name], MD;  Location: [facility] OR;  Service: Orthopedics;  Laterality: Right;   History  Substance Use Topics  . Smoking status: Never Smoker   . Smokeless tobacco: Not on file  . Alcohol Use: No   Family History  Problem Relation Age of Onset  . Cancer Mother     breast CA  .  Heart disease Father     MI  . Diabetes Father   . Hypertension Father   . Cancer Sister     uterine CA   Allergies  Allergen Reactions  . Penicillins     REACTION: hives  . Sulfonamide Derivatives Hives   Current Outpatient Prescriptions on File Prior to Visit  Medication Sig Dispense Refill  . atenolol (TENORMIN) 50 MG tablet TAKE 1 TABLET DAILY  90 tablet  1  . Calcium Carbonate-Vitamin D (CALCIUM-VITAMIN D) 600-200 MG-UNIT CAPS Take 1 capsule by mouth 3 (three) times daily.       Marland Kitchen DETROL LA 4 MG 24 hr capsule TAKE 1 CAPSULE ONCE DAILY  90 capsule  3  . diphenhydrAMINE (BENADRYL) 25 MG tablet Take 25 mg by mouth daily as needed.       . hydrochlorothiazide (HYDRODIURIL) 25 MG tablet TAKE 1 TABLET DAILY  90 tablet  1  . methocarbamol (ROBAXIN) 500 MG tablet Take 1 tablet (500 mg total) by mouth every 6 (six) hours as needed.  60 tablet  0  . Multiple Vitamins-Iron (MULTIVITAMIN/IRON PO) Take 1 tablet by mouth daily.       Marland Kitchen oxyCODONE-acetaminophen (PERCOCET) 5-325 MG per tablet Take 1-2 tablets by mouth every 4 (four) hours as needed.  60 tablet  0  .  potassium chloride (K-DUR,KLOR-CON) 10 MEQ tablet TAKE 1 TABLET DAILY  90 tablet  2  . warfarin (COUMADIN) 5 MG tablet Take 1 tablet (5 mg total) by mouth daily.  30 tablet  0        Review of Systems See HPI Constitutional: Negative for fever, appetite change, fatigue and unexpected weight change.  Eyes: Negative for pain and visual disturbance.  Respiratory: Negative for cough and shortness of breath.   Cardiovascular: Negative.  for cp or palpitations Gastrointestinal: Negative for nausea, diarrhea and constipation.  Genitourinary: pos for urgency and frequency. , no hematuria or new back pain  Skin: Negative for pallor. or rash  Neurological: Negative for weakness, light-headedness, numbness and headaches.  Hematological: Negative for adenopathy. Does not bruise/bleed easily.  Psychiatric/Behavioral: Negative for  dysphoric mood. The patient is not nervous/anxious.          Objective:   Physical Exam  BP 120/70  Pulse 64  Temp(Src) 98 F (36.7 C) (Oral)  Wt 201 lb (91.173 kg)  Constitutional: She appears well-developed and well-nourished. No distress.       overwt and well appearing   HENT:  Head: Normocephalic and atraumatic.  Mouth/Throat: Oropharynx is clear and moist.  Eyes: Conjunctivae and EOM are normal. Pupils are equal, round, and reactive to light.  Neck: Normal range of motion. Neck supple. No JVD present. Carotid bruit is not present. No thyromegaly present.  Cardiovascular: Normal rate, regular rhythm and normal heart sounds.   Pulmonary/Chest: Effort normal and breath sounds normal. No respiratory distress. She has no wheezes.  Abdominal: Soft. Bowel sounds are normal. She exhibits no distension and no mass. There is no guarding.       Very slight suprapubic tenderness without gaurding or rebound   Musculoskeletal: She exhibits no edema.       No cva tenderness   Lymphadenopathy:    She has no cervical adenopathy.  Neurological: She is alert. She has normal reflexes. No cranial nerve deficit. Coordination normal.  Skin: well heeling vertical incision on left knee- no drainage- does have some surrounding warmth and erythema, staples have been removed.  Psychiatric: She has a normal mood and affect.       Assessment & Plan:   1. URINARY FREQUENCY, CHRONIC  POCT urinalysis dipstick, Urine culture   Deteriorated. UA pos for trace blood and trace LE. Leg does feel warm and red. Discussed with pt and her daughter, she is allergic to sulfa and PCN so we are very limited in abx that could treat cellulitis and UTIs. Will start Doxycyline 100 mg twice daily x 10 days and send urine for cx. The patient indicates understanding of these issues and agrees with the plan.

## 2012-01-27 ENCOUNTER — Ambulatory Visit: Payer: Medicare Other | Admitting: Family Medicine

## 2012-01-29 LAB — URINE CULTURE: Colony Count: >=100000

## 2012-02-18 ENCOUNTER — Telehealth: Payer: Self-pay

## 2012-02-18 NOTE — Telephone Encounter (Signed)
For 1 week pt had frequency of urine, slight burn when urinates, strong odor and urine appears darker. No back or abdominal pain.Today pt has fever 99.8 and chills. Dr Milinda Antis said pt needs to see someone tomorrow.(Dr Milinda Antis out of office tomorrow).Appt scheduled with Dr Dayton Martes 04/25 at 11:45 am per OK with Jacki Cones. If pt condition changes or worsens tonight pt will go to urgent care.

## 2012-02-19 ENCOUNTER — Encounter: Payer: Self-pay | Admitting: Family Medicine

## 2012-02-19 ENCOUNTER — Ambulatory Visit (INDEPENDENT_AMBULATORY_CARE_PROVIDER_SITE_OTHER): Payer: Medicare Other | Admitting: Family Medicine

## 2012-02-19 VITALS — BP 130/80 | HR 60 | Temp 97.4°F | Wt 198.0 lb

## 2012-02-19 DIAGNOSIS — R300 Dysuria: Secondary | ICD-10-CM

## 2012-02-19 DIAGNOSIS — R350 Frequency of micturition: Secondary | ICD-10-CM

## 2012-02-19 LAB — POCT URINALYSIS DIPSTICK
Glucose, UA: NEGATIVE
Ketones, UA: NEGATIVE
Spec Grav, UA: 1.015
Urobilinogen, UA: NEGATIVE

## 2012-02-19 MED ORDER — CIPROFLOXACIN HCL 250 MG PO TABS: 250.0000 mg | ORAL_TABLET | Freq: Two times a day (BID) | ORAL | Status: AC

## 2012-02-19 MED ORDER — CIPROFLOXACIN HCL 250 MG PO TABS: 250.0000 mg | ORAL_TABLET | Freq: Two times a day (BID) | ORAL | Status: DC

## 2012-02-19 NOTE — Progress Notes (Signed)
Subjective:    Patient ID: [name], female    DOB: December 10, 1933, 78 y.o.   MRN: [id number]  HPI 78 yo pt of Dr. Milinda Antis with complicated medical history here for:  Recurrence of dysuria and urinary frequency.  Saw her on 4/1 for cellulitis and urinary frequency- S/p TKR on 01/12/2012. Treated with doxycycline- PCN and sulfa allergic and we wanted to treat UTI and cellulitis.  Symptoms resolved until past few days.  Reports subjective fever yesterday.   Patient Active Problem List  Diagnoses  . HYPERCHOLESTEROLEMIA, PURE  . HYPOKALEMIA  . DECREASED WHITE BLOOD CELL COUNT NOS  . HYPERTENSION, ESSENTIAL NOS  . PSVT  . SCLERODERMA  . DEGENERATIVE JOINT DISEASE  . SPINAL STENOSIS, LUMBAR  . OSTEOPENIA  . PES PLANUS  . SPONDYLOLISTHESIS  . Cough  . URINARY FREQUENCY, CHRONIC  . ALLERGY  . Ingrown hair  . Painful total knee replacement, dislocating patella   Past Medical History  Diagnosis Date  . Osteopenia   . Hyperlipidemia   . Hypertension   . PSVT (paroxysmal supraventricular tachycardia)   . Spinal stenosis   . Overactive bladder     urge incontinence   Past Surgical History  Procedure Date  . Cataract extraction 2008  . Bladder suspension 1993  . Joint replacement 2000    left TKR  . Total knee revision 01/12/2012    Procedure: TOTAL KNEE REVISION;  Surgeon: [name], MD;  Location: [facility];  Service: Orthopedics;  Laterality: Left;  DEPUY/ LCS , HAND SET  . Trigger finger release 01/12/2012    Procedure: RELEASE TRIGGER FINGER/A-1 PULLEY;  Surgeon: [name], MD;  Location: [facility] OR;  Service: Orthopedics;  Laterality: Right;   History  Substance Use Topics  . Smoking status: Never Smoker   . Smokeless tobacco: Not on file  . Alcohol Use: No   Family History  Problem Relation Age of Onset  . Cancer Mother     breast CA  . Heart disease Father     MI  . Diabetes Father   . Hypertension Father   . Cancer Sister     uterine CA   Allergies    Allergen Reactions  . Penicillins     REACTION: hives  . Sulfonamide Derivatives Hives   Current Outpatient Prescriptions on File Prior to Visit  Medication Sig Dispense Refill  . atenolol (TENORMIN) 50 MG tablet TAKE 1 TABLET DAILY  90 tablet  1  . Calcium Carbonate-Vitamin D (CALCIUM-VITAMIN D) 600-200 MG-UNIT CAPS Take 1 capsule by mouth 3 (three) times daily.       Marland Kitchen DETROL LA 4 MG 24 hr capsule TAKE 1 CAPSULE ONCE DAILY  90 capsule  3  . diphenhydrAMINE (BENADRYL) 25 MG tablet Take 25 mg by mouth daily as needed.       . hydrochlorothiazide (HYDRODIURIL) 25 MG tablet TAKE 1 TABLET DAILY  90 tablet  1  . methocarbamol (ROBAXIN) 500 MG tablet Take 500 mg by mouth every 6 (six) hours as needed.      . Multiple Vitamins-Iron (MULTIVITAMIN/IRON PO) Take 1 tablet by mouth daily.       Marland Kitchen oxyCODONE-acetaminophen (PERCOCET) 5-325 MG per tablet Take 1-2 tablets by mouth every 4 (four) hours as needed.      . potassium chloride (K-DUR,KLOR-CON) 10 MEQ tablet TAKE 1 TABLET DAILY  90 tablet  2  . warfarin (COUMADIN) 5 MG tablet Take 1 tablet (5 mg total) by mouth daily.  30 tablet  0        Review of Systems See HPI Constitutional: Negative for fever, appetite change, fatigue and unexpected weight change.  Eyes: Negative for pain and visual disturbance.  Respiratory: Negative for cough and shortness of breath.   Cardiovascular: Negative.  for cp or palpitations Gastrointestinal: Negative for nausea, diarrhea and constipation.  Genitourinary: pos for urgency and frequency. , no hematuria or new back pain  Skin: Negative for pallor. or rash  Neurological: Negative for weakness, light-headedness, numbness and headaches.  Hematological: Negative for adenopathy. Does not bruise/bleed easily.  Psychiatric/Behavioral: Negative for dysphoric mood. The patient is not nervous/anxious.          Objective:   Physical Exam  BP 130/80  Pulse 60  Temp(Src) 97.4 F (36.3 C) (Oral)  Wt 198 lb  (89.812 kg)  Constitutional: She appears well-developed and well-nourished. No distress.       overwt and well appearing   HENT:  Head: Normocephalic and atraumatic.  Mouth/Throat: Oropharynx is clear and moist.  Eyes: Conjunctivae and EOM are normal. Pupils are equal, round, and reactive to light.  Neck: Normal range of motion. Neck supple. No JVD present. Carotid bruit is not present. No thyromegaly present.  Cardiovascular: Normal rate, regular rhythm and normal heart sounds.   Pulmonary/Chest: Effort normal and breath sounds normal. No respiratory distress. She has no wheezes.  Abdominal: Soft. Bowel sounds are normal. She exhibits no distension and no mass. There is no guarding.       Very slight suprapubic tenderness without gaurding or rebound   Musculoskeletal: She exhibits no edema.       No cva tenderness   Lymphadenopathy:    She has no cervical adenopathy.  Neurological: She is alert. She has normal reflexes. No cranial nerve deficit. Coordination normal.   Psychiatric: She has a normal mood and affect.       Assessment & Plan:   1. Dysuria  POCT urinalysis dipstick   Deteriorated.  UA pos for LE. Will treat with cipo 250 mg twice daily x 7 days are E.col was sensitive to cipro on culture from 4/1. Resend urine for cx. The patient indicates understanding of these issues and agrees with the plan.

## 2012-02-19 NOTE — Patient Instructions (Signed)
Good to see you. Please take cipro as directed.  Drink plenty of fluids. We will call you with your culture results.

## 2012-02-19 NOTE — Progress Notes (Signed)
Addended by: Eliezer Bottom on: 02/19/2012 12:44 PM   Modules accepted: Orders

## 2012-02-20 ENCOUNTER — Emergency Department (INDEPENDENT_AMBULATORY_CARE_PROVIDER_SITE_OTHER)
Admission: EM | Admit: 2012-02-20 | Discharge: 2012-02-20 | Disposition: A | Discharge status: Home / Self Care | Payer: Medicare Other | Source: Home / Self Care

## 2012-02-20 ENCOUNTER — Telehealth: Payer: Self-pay | Admitting: Family Medicine

## 2012-02-20 ENCOUNTER — Encounter (HOSPITAL_COMMUNITY): Payer: Self-pay | Admitting: *Deleted

## 2012-02-20 DIAGNOSIS — IMO0001 Reserved for inherently not codable concepts without codable children: Secondary | ICD-10-CM

## 2012-02-20 DIAGNOSIS — L02519 Cutaneous abscess of unspecified hand: Secondary | ICD-10-CM

## 2012-02-20 DIAGNOSIS — S60219A Contusion of unspecified wrist, initial encounter: Secondary | ICD-10-CM

## 2012-02-20 DIAGNOSIS — IMO0002 Reserved for concepts with insufficient information to code with codable children: Secondary | ICD-10-CM

## 2012-02-20 DIAGNOSIS — L0390 Cellulitis, unspecified: Secondary | ICD-10-CM

## 2012-02-20 MED ORDER — TETANUS-DIPHTH-ACELL PERTUSSIS 5-2.5-18.5 LF-MCG/0.5 IM SUSP
0.5000 mL | Freq: Once | INTRAMUSCULAR | Status: AC
  Administered 2012-02-20: 0.5 mL via INTRAMUSCULAR

## 2012-02-20 MED ORDER — DOXYCYCLINE HYCLATE 100 MG PO TABS: 100.0000 mg | ORAL_TABLET | Freq: Two times a day (BID) | ORAL | Status: AC

## 2012-02-20 MED ORDER — DOXYCYCLINE HYCLATE 50 MG PO CAPS: 50.0000 mg | ORAL_CAPSULE | Freq: Two times a day (BID) | ORAL | Status: DC

## 2012-02-20 MED ORDER — CLINDAMYCIN HCL 300 MG PO CAPS: 300.0000 mg | ORAL_CAPSULE | Freq: Three times a day (TID) | ORAL | Status: AC

## 2012-02-20 MED ORDER — TETANUS-DIPHTH-ACELL PERTUSSIS 5-2.5-18.5 LF-MCG/0.5 IM SUSP
INTRAMUSCULAR | Status: AC
  Filled 2012-02-20: qty 0.5

## 2012-02-20 NOTE — ED Notes (Signed)
tdap    boostrix  0.5 ml  l  deltiod   [id number]    [id number]

## 2012-02-20 NOTE — ED Notes (Signed)
Pt   Unsure of  Her last   Tet   Injection      -

## 2012-02-20 NOTE — ED Notes (Signed)
Pt  Was  Bitten r  Hand  2  Am   This  Morning     By  Her  Cat  -  Pt  Reports  The  Cat  Was  Immunized    And  Is  At the  premeises     She  Has  Some  Redness and  Swelling of the  Hand

## 2012-02-20 NOTE — Telephone Encounter (Signed)
Needs to be seen today- do not wait until tomorrow- and I am getting this message at 4:56 pm Symptoms are concerning for infection - go to UC or ER now

## 2012-02-20 NOTE — Discharge Instructions (Signed)
Thank you for coming in today. Please take both antibiotics starting tonight.  Come back tomorrow after 2 pm.  Please go to the ER if your swelling or redness extends into the fingers or up your forearm.  Let me know you have high fever.  Animal Bite An animal bite can result in a scratch on the skin, deep open cut, puncture of the skin, crush injury, or tearing away of the skin or a body part. Dogs are responsible for most animal bites. Children are bitten more often than adults. An animal bite can range from very mild to more serious. A small bite from your house pet is no cause for alarm. However, some animal bites can become infected or injure a bone or other tissue. You must seek medical care if:  The skin is broken and bleeding does not slow down or stop after 15 minutes.   The puncture is deep and difficult to clean (such as a cat bite).   Pain, warmth, redness, or pus develops around the wound.   The bite is from a stray animal or rodent. There may be a risk of rabies infection.   The bite is from a snake, raccoon, skunk, fox, coyote, or bat. There may be a risk of rabies infection.   The person bitten has a chronic illness such as diabetes, liver disease, or cancer, or the person takes medicine that lowers the immune system.   There is concern about the location and severity of the bite.  It is important to clean and protect an animal bite wound right away to prevent infection. Follow these steps:  Clean the wound with plenty of water and soap.   Apply an antibiotic cream.   Apply gentle pressure over the wound with a clean towel or gauze to slow or stop bleeding.   Elevate the affected area above the heart to help stop any bleeding.   Seek medical care. Getting medical care within 8 hours of the animal bite leads to the best possible outcome.  DIAGNOSIS  Your caregiver will most likely:  Take a detailed history of the animal and the bite injury.   Perform a wound exam.     Take your medical history.  Blood tests or X-rays may be performed. Sometimes, infected bite wounds are cultured and sent to a lab to identify the infectious bacteria.  TREATMENT  Medical treatment will depend on the location and type of animal bite as well as the patient's medical history. Treatment may include:  Wound care, such as cleaning and flushing the wound with saline solution, bandaging, and elevating the affected area.   Antibiotics.   Tetanus immunization.   Rabies immunization.   Leaving the wound open to heal. This is often done with animal bites, due to the high risk of infection. However, in certain cases, wound closure with stitches, wound adhesive, skin adhesive strips, or staples may be used.  Infected bites that are left untreated may require intravenous (IV) antibiotics and surgical treatment in the hospital. HOME CARE INSTRUCTIONS  Follow your caregiver's instructions for wound care.   Take all medicines as directed.   If your caregiver prescribes antibiotics, take them as directed. Finish them even if you start to feel better.   Follow up with your caregiver for further exams or immunizations as directed.  You may need a tetanus shot if:  You cannot remember when you had your last tetanus shot.   You have never had a tetanus shot.  The injury broke your skin.  If you get a tetanus shot, your arm may swell, get red, and feel warm to the touch. This is common and not a problem. If you need a tetanus shot and you choose not to have one, there is a rare chance of getting tetanus. Sickness from tetanus can be serious. SEEK MEDICAL CARE IF:  You notice warmth, redness, soreness, swelling, pus discharge, or a bad smell coming from the wound.   You have a red line on the skin coming from the wound.   You have a fever, chills, or a general ill feeling.   You have nausea or vomiting.   You have continued or worsening pain.   You have trouble moving the  injured part.   You have other questions or concerns.  MAKE SURE YOU:  Understand these instructions.   Will watch your condition.   Will get help right away if you are not doing well or get worse.  Document Released: 07/01/2011 Document Revised: 10/02/2011 Document Reviewed: 07/01/2011 [facility] Patient Information 2012 [place], [place].

## 2012-02-20 NOTE — Telephone Encounter (Signed)
Spoke with [name] and advised as instructed, they are heading to [facility] now.

## 2012-02-20 NOTE — Telephone Encounter (Signed)
Caller: Dianne/Child; PCP: Tower, Marne A.; CB#: [phone]; ; ; Call regarding Animal Bite;  Daughter calling and states pt. was bitten by cat 4-25. Was bitten on hand. Hand is swollen. Temp is 100.5 oral 4-26. Has pain in hand. Is red from wrist to knuckle. Has yellow drainage. Tender to touch.  Went "pretty deep" in hand. Advised UC as too late in afternoon for appointment.

## 2012-02-20 NOTE — ED Provider Notes (Signed)
[name] is a 78 y.o. female who presents to Urgent Care today for cat bite of the right wrist.  At 2 PM yesterday [name] was bitten on the dorsal aspect of her right wrist by her cat. She cleaned the wound with noted worsening swelling overnight. She applied ice which helped some. During the day today she has had worsening swelling redness and pain.  Additionally she notes fever and chills.  She has not had an injury like this before. She has pain with wrist motion but no pain with finger extension or flexion. Additionally she notes that she is currently on Cipro for urinary tract infection.   PMH reviewed. Patient with mild hypertension and a history of a left knee joint replacement ROS as above otherwise neg.  no chest pains, palpitations, fevers, chills, abdominal pain nausea or vomiting. Medications reviewed. Patient is allergic to penicillin and sulfa No current facility-administered medications for this encounter.   Current Outpatient Prescriptions  Medication Sig Dispense Refill  . atenolol (TENORMIN) 50 MG tablet TAKE 1 TABLET DAILY  90 tablet  1  . Calcium Carbonate-Vitamin D (CALCIUM-VITAMIN D) 600-200 MG-UNIT CAPS Take 1 capsule by mouth 3 (three) times daily.       . ciprofloxacin (CIPRO) 250 MG tablet Take 1 tablet (250 mg total) by mouth 2 (two) times daily.  14 tablet  0  . clindamycin (CLEOCIN) 300 MG capsule Take 1 capsule (300 mg total) by mouth 3 (three) times daily.  30 capsule  0  . DETROL LA 4 MG 24 hr capsule TAKE 1 CAPSULE ONCE DAILY  90 capsule  3  . diphenhydrAMINE (BENADRYL) 25 MG tablet Take 25 mg by mouth daily as needed.       . doxycycline (VIBRA-TABS) 100 MG tablet Take 1 tablet (100 mg total) by mouth 2 (two) times daily.  20 tablet  0  . hydrochlorothiazide (HYDRODIURIL) 25 MG tablet TAKE 1 TABLET DAILY  90 tablet  1  . methocarbamol (ROBAXIN) 500 MG tablet Take 500 mg by mouth every 6 (six) hours as needed.      . Multiple Vitamins-Iron  (MULTIVITAMIN/IRON PO) Take 1 tablet by mouth daily.       Marland Kitchen oxyCODONE-acetaminophen (PERCOCET) 5-325 MG per tablet Take 1-2 tablets by mouth every 4 (four) hours as needed.      . potassium chloride (K-DUR,KLOR-CON) 10 MEQ tablet TAKE 1 TABLET DAILY  90 tablet  2  .        Exam:  BP 119/64  Pulse 70  Temp(Src) 100.2 F (37.9 C) (Oral)  Resp 18  SpO2 93% Gen: Well NAD Right WRIST: Puncture room and on the dorsal radial aspect of the right wrist. Erythema extending in a circular pattern approximately 3 mm in diameter surrounding the puncture wound. Small amount of pus is expressible.  Pain with the best range of motion the patient is able to have a nearly full range of motion of the wrist. Additionally she does have pain with full flexion of the fingers but normal strength throughout.  She has no extending redness along tendon sheaths to fingers or proximally along the arm.  Procedure note: Attempt to treat a potential abscess and obtain tissue for culture Consent obtained and timeout performed. Area cleaned with alcohol.  Less than 1 ml 2% lidocaine with epinephrine was injected above the puncture wound.  11 blade scalpel was used to make a very superficial cut to expose any potential pocket of abscess.  However patient  had mild bleeding and decision was made to discontinue the procedure.    Bleeding stopped within 2 or 3 minutes with gauze pressure  No results found for this or any previous visit (from the past 24 hour(s)). No results found.  Assessment and Plan: 78 y.o. female with cat bite and cellulitis.  She does have potential early joint involvement or tendon sheath involvement however I don't see any evidence of that this evening. She does not have tracking of the redness or swelling along tendon sheaths nor extreme pain with range of motion of the wrist indicating a septic joint.  I do however feel strongly that prompt administration of antibiotics is essential for treatment  tonight.  As she is penicillin allergic we'll use doxycycline and clindamycin and followup in one day.   If patient is not improving will discuss the case with hand surgeon and potentially send patient to the emergency room. We discussed signs symptoms that would prompt patient to go to emergency room including extending redness worsening pain or worsening fever.  I feel the patient's mild fever this evening is typical of cellulitis and we will continue to follow that tomorrow.    Rodolph Bong, MD 02/20/12 [phone]

## 2012-02-20 NOTE — ED Provider Notes (Signed)
Medical screening examination/treatment/procedure(s) were performed by non-physician practitioner and as supervising physician I was immediately available for consultation/collaboration.  Shaun Runyon   Aleks Nawrot, MD 02/20/12 2027 

## 2012-02-21 ENCOUNTER — Encounter (HOSPITAL_COMMUNITY): Payer: Self-pay

## 2012-02-21 ENCOUNTER — Emergency Department (INDEPENDENT_AMBULATORY_CARE_PROVIDER_SITE_OTHER)
Admission: EM | Admit: 2012-02-21 | Discharge: 2012-02-21 | Disposition: A | Discharge status: Home / Self Care | Payer: Medicare Other | Source: Home / Self Care | Attending: Family Medicine | Admitting: Family Medicine

## 2012-02-21 DIAGNOSIS — T148XXA Other injury of unspecified body region, initial encounter: Secondary | ICD-10-CM

## 2012-02-21 DIAGNOSIS — L089 Local infection of the skin and subcutaneous tissue, unspecified: Secondary | ICD-10-CM

## 2012-02-21 DIAGNOSIS — IMO0001 Reserved for inherently not codable concepts without codable children: Secondary | ICD-10-CM

## 2012-02-21 DIAGNOSIS — W5501XA Bitten by cat, initial encounter: Secondary | ICD-10-CM

## 2012-02-21 LAB — URINE CULTURE: Colony Count: 30000

## 2012-02-21 NOTE — ED Notes (Signed)
Pt here for recheck of cellulitis of cat bite of rt wrist.

## 2012-02-21 NOTE — Discharge Instructions (Signed)
Thank you for coming in today. Please follow up with Dr. Amanda Pea at [facility] on Monday at 8am.  Go to the ER if your hand gets worse or starts to ooze pus or you have a high fever.  Call [facility] if you have questions.  It was a pleasure meeting you.

## 2012-02-21 NOTE — ED Provider Notes (Signed)
[name] is a 78 y.o. female who presents to Urgent Care today for followup of her right wrist cat bite and infection.  She suffered a cat bite to her right wrist approximately 48 hours ago. She was seen in urgent care yesterday evening and started on clindamycin and doxycycline. Today she feels a bit better and notes that the swelling has decreased. She continues to have small amount of pus expressible from the wound. She denies any worsening pain with hand motion.  She denies any significant fevers or chills.   PMH reviewed. As above patient additionally has hypertension hyper lipidemia and PSVT. ROS as above otherwise neg.  no chest pains, palpitations, fevers, chills, abdominal pain nausea or vomiting. Medications reviewed. No current facility-administered medications for this encounter.   Current Outpatient Prescriptions  Medication Sig Dispense Refill  . atenolol (TENORMIN) 50 MG tablet TAKE 1 TABLET DAILY  90 tablet  1  . Calcium Carbonate-Vitamin D (CALCIUM-VITAMIN D) 600-200 MG-UNIT CAPS Take 1 capsule by mouth 3 (three) times daily.       . ciprofloxacin (CIPRO) 250 MG tablet Take 1 tablet (250 mg total) by mouth 2 (two) times daily.  14 tablet  0  . clindamycin (CLEOCIN) 300 MG capsule Take 1 capsule (300 mg total) by mouth 3 (three) times daily.  30 capsule  0  . DETROL LA 4 MG 24 hr capsule TAKE 1 CAPSULE ONCE DAILY  90 capsule  3  . diphenhydrAMINE (BENADRYL) 25 MG tablet Take 25 mg by mouth daily as needed.       . doxycycline (VIBRA-TABS) 100 MG tablet Take 1 tablet (100 mg total) by mouth 2 (two) times daily.  20 tablet  0  . hydrochlorothiazide (HYDRODIURIL) 25 MG tablet TAKE 1 TABLET DAILY  90 tablet  1  . methocarbamol (ROBAXIN) 500 MG tablet Take 500 mg by mouth every 6 (six) hours as needed.      . Multiple Vitamins-Iron (MULTIVITAMIN/IRON PO) Take 1 tablet by mouth daily.       Marland Kitchen oxyCODONE-acetaminophen (PERCOCET) 5-325 MG per tablet Take 1-2 tablets by mouth every 4  (four) hours as needed.      . potassium chloride (K-DUR,KLOR-CON) 10 MEQ tablet TAKE 1 TABLET DAILY  90 tablet  2   Facility-Administered Medications Ordered in Other Encounters  Medication Dose Route Frequency Provider Last Rate Last Dose  . TDaP (BOOSTRIX) injection 0.5 mL  0.5 mL Intramuscular Once Rodolph Bong, MD   0.5 mL at 02/20/12 1930    Exam:  There were no vitals taken for this visit. Gen: Well NAD LEFT HAND: Decreased erythema and induration. Small amount of pus is expressible from the wound. Normal wrist range of motion and hand function however patient does experience mild to moderate pain with wrist flexion.  No results found for this or any previous visit (from the past 24 hour(s)). No results found.  Assessment and Plan: 78 y.o. female with cat bite and cellulitis. The PA for Dr. Amanda Pea was present in urgent care today seeing another patient.  I discussed the case with him and he elected to see the patient.  He performed a incision and drainage of the wound and agreed with continuing antibiotics and followup with Dr. Amanda Pea on Monday morning.  The patient was advised about worsening signs or symptoms such as worsening fever chills pain or pus. She expresses understanding and will followup on Monday morning.     Rodolph Bong, MD 02/21/12 [phone]

## 2012-02-23 NOTE — ED Provider Notes (Signed)
Medical screening examination/treatment/procedure(s) were performed by resident physician or non-physician practitioner and as supervising physician I was immediately available for consultation/collaboration.   Barkley Bruns MD.    Linna Hoff, MD 02/23/12 [phone]

## 2012-02-23 NOTE — H&P (Signed)
NAMETORINA, EY NO.:  [id number]  MEDICAL RECORD NO.:  [id number]  LOCATION:  [facility]                         FACILITY:  [facility]  PHYSICIAN:  Dionne Ano. Gramig, M.D.DATE OF BIRTH:  07-15-1934  DATE OF ADMISSION:  02/21/2012 DATE OF DISCHARGE:  02/21/2012                             HISTORY & PHYSICAL   CHIEF COMPLAINT:  "My cat bit."  HISTORY OF PRESENT ILLNESS:  Ms. Rasheed is an extremely pleasant 78 year old female, who is seen today at the request of Dr. Denyse Amass for evaluation of her right wrist.  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  She has been seen previously at [facility] where she was started on clindamycin and doxycycline for the cat bite.  She has noted marked improvement over the past 24-48 hours, and presented initially for wound check.  As we were here examining additional patients, we were asked to evaluate her wrist for further followup and management.  She denies any fevers, chills, nausea, or vomiting.  She is tolerating her antibiotic regimen without difficulty.  PAST MEDICAL HISTORY:  Significant for, 1. Hypertension. 2. Hyperlipidemia. 3. PSVT.  ALLERGIES: 1. PENICILLIN. 2. SULFA.  FAMILY HISTORY:  Family medical history reviewed, surgical history reviewed.  REVIEW OF SYSTEMS:  Negative for any recent fevers, chills, malaise, visual changes, chest pain, shortness of breath, bowel or bladder dysfunction.  CURRENT MEDICATIONS: 1. Atenolol 50 mg 1 daily. 2. Calcium supplementation with vitamin D 1 tablet 3 times daily. 3. Cipro 250 one tablet by mouth 2 times daily. 4. Clindamycin 300 mg 3 times daily. 5. Detrol LA 4 mg daily. 6. Benadryl 25 mg as needed. 7. Doxycycline 100 mg 1 p.o. b.i.d. 8. Hydrochlorothiazide 25 mg 1 daily as needed. 9. Methocarbamol 500 mg 1 p.o. q.6 h.  p.r.n. 10.Multivitamin. 11.Oxycodone for pain 5 mg 1-2 p.o. q.4-6 h. p.r.n. pain. 12.Klor-Con 10 mEq 1 tablet daily.  PHYSICAL EXAMINATION:  GENERAL: She is extremely pleasant female in no acute distress, appearing her stated age, weight, height, alert and oriented x3, well groomed. VITAL SIGNS: Stable. She is afebrile. CHEST: She has equal bilateral chest expansion present time. EXTREMITIES: She ambulates without an antalgic gait.  Evaluation of the right wrist shows that she has mild localized hyperemia and a small amount of well circumscribed swelling about the dorsal wrist region over the wrist crease itself.  She has full extension without significant pain.  Other digits, she has full flexion, general wrist range of motion is nontender.  Per patient's description, her initial nodule over the wrist was as big as a golf ball today.  It is, per her description here, 100% improved.  She has some mild localized tenderness.  There is mild amount of eschar over the wound itself with a scant amount of purulence present.  She has no signs of ischemic cellulitis.  She has some mild swelling about the dorsal aspect of the hand and very minimal hyperemia.  ASSESSMENT: 1. Status post cat bite two days with improvement with still a mild  amount of swelling and scant purulence present. 2. History of hypertension. 3. History of hyperlipidemia. 4. History of paroxysmal supraventricular tachycardia.  PLAN:  We discussed with her recommendations of continued antibiotics in the form of Cleocin and doxycycline as she is on this regimen.  In addition, we will recommend proceeding with I and D of the dorsal wound and excisional debridement of the eschar and slight purulence.  Thus after obtaining verbal consent under sterile technique, 0.5 mL of Xylocaine 2% without epinephrine was administered into as a field block about the wound.  The wound was then opened with a small 5-6 mm incision.   Cultures were obtained.  No gross purulence was egressed. Following this, sharp excisional debridement was performed of skin and subcutaneous tissue.  She tolerated this well.  Copious irrigation was implemented with a liter of saline to the wound and surrounding regions. She tolerated this very well.  There were no complications.  Once irrigation and debridement was performed, wicking was placed into the wound followed by soft dressing and a splint.  We discussed with her following up with [place] on Monday morning for a wound check.  She will continue on her antibiotic regimen.  Should she have any increased difficulties or findings, she will contact us immediately. We discussed all issues with her at length.  We discussed with her elevation and finger range of motion and all issues.     Karie Chimera, P.A.-C.   ______________________________ Dionne Ano. Amanda Pea, M.D.    BB/MEDQ  D:  02/23/2012  T:  02/23/2012  Job:  [id number]

## 2012-02-24 LAB — WOUND CULTURE: Culture: NO GROWTH

## 2012-02-25 ENCOUNTER — Telehealth: Payer: Self-pay

## 2012-02-25 NOTE — Telephone Encounter (Signed)
Pt walked in to leave message for Dr Milinda Antis. Cat bit pt 02/19/12; pt was advised by phone by our office to go to [facility]. Pt had 2 visits at urgent care and 3 visits to hand doctor at [facility], pt not sure of drs name. Wound lanced; pt presently taking doxycline 100 mg and clindamycin 300 mg. Wound was packed and dressed. Today pt was released and pt is to clean with soap and cover with gauze and ace bandage twice a day. Pt said no drainage or sign of infection. Pt received a tetanus shot at [facility]  On 02/20/12. Not sure if tdap or td. Pt wanted Dr Milinda Antis to be aware and does Dr Milinda Antis want to have f/u appt with pt. Pt can be reached at [phone] and uses [place] [facility] if pharmacy needed.

## 2012-02-25 NOTE — Telephone Encounter (Signed)
She is good on tetnus shot  If she is not supposed to return to the hand doctor (please send for those records) , please have her f/u with me within a week or so  Update if increased redness/swelling/fever / drainage/ etc

## 2012-02-26 NOTE — Telephone Encounter (Signed)
Patient advised as instructed via telephone, she did receive another tetanus shot because she couldn't remember when her last one was.  She stated that the area looks very good today, no drainage, no redness, no fever.  I will send for records today [facility]).  She will call back to schedule f/u appt.

## 2012-04-16 ENCOUNTER — Other Ambulatory Visit: Payer: Self-pay | Admitting: Family Medicine

## 2012-04-16 NOTE — Telephone Encounter (Signed)
Done

## 2012-05-27 ENCOUNTER — Encounter: Payer: Self-pay | Admitting: Family Medicine

## 2012-05-27 ENCOUNTER — Ambulatory Visit (INDEPENDENT_AMBULATORY_CARE_PROVIDER_SITE_OTHER): Payer: Medicare Other | Admitting: Family Medicine

## 2012-05-27 ENCOUNTER — Telehealth: Payer: Self-pay

## 2012-05-27 VITALS — BP 118/80 | HR 68 | Temp 98.1°F | Wt 201.5 lb

## 2012-05-27 DIAGNOSIS — H60399 Other infective otitis externa, unspecified ear: Secondary | ICD-10-CM

## 2012-05-27 DIAGNOSIS — R350 Frequency of micturition: Secondary | ICD-10-CM

## 2012-05-27 LAB — POCT URINALYSIS DIPSTICK
Bilirubin, UA: NEGATIVE
Glucose, UA: NEGATIVE
Nitrite, UA: NEGATIVE
Urobilinogen, UA: 0.2

## 2012-05-27 LAB — POCT UA - MICROSCOPIC ONLY

## 2012-05-27 MED ORDER — MUPIROCIN 2 % EX OINT: TOPICAL_OINTMENT | Freq: Three times a day (TID) | CUTANEOUS | Status: AC

## 2012-05-27 NOTE — Progress Notes (Signed)
  Subjective:    Patient ID: [name], female    DOB: 10/21/1934, 78 y.o.   MRN: [id number]  HPI 78 year old female with history of chronic urinary frequency presents with 3 days of increase infrequency . Feel chills when she finishes, no real burning dysuria. Has noted some incontinence and urgency as well in past few days.  No abdominal pain.  No fever. No flank pain, does have chronic low back pain.  Last UTI was  Over a year ago, last two urine culture 05/2011 and 01/2012 were negative.  She had a second ear piercing on 01/14/2012.Marland Kitchen Lobes were normal following.  Then noted knot in last few days, applied pressure last night and pus was expressed. She has been applying polyporin.   Review of Systems  Constitutional: Negative for fever and fatigue.  HENT: Negative for ear pain.   Eyes: Negative for pain.  Respiratory: Negative for chest tightness and shortness of breath.   Cardiovascular: Negative for chest pain, palpitations and leg swelling.  Gastrointestinal: Negative for abdominal pain.  Genitourinary: Negative for dysuria.       Objective:   Physical Exam  Constitutional: Vital signs are normal. She appears well-developed and well-nourished. She is cooperative.  Non-toxic appearance. She does not appear ill. No distress.  HENT:  Head: Normocephalic.  Right Ear: Hearing, tympanic membrane, external ear and ear canal normal. Tympanic membrane is not erythematous, not retracted and not bulging.  Left Ear: Hearing, tympanic membrane, external ear and ear canal normal. Tympanic membrane is not erythematous, not retracted and not bulging.  Nose: No mucosal edema or rhinorrhea. Right sinus exhibits no maxillary sinus tenderness and no frontal sinus tenderness. Left sinus exhibits no maxillary sinus tenderness and no frontal sinus tenderness.  Mouth/Throat: Uvula is midline, oropharynx is clear and moist and mucous membranes are normal.  Eyes: Conjunctivae, EOM and lids are normal.  Pupils are equal, round, and reactive to light. No foreign bodies found.  Neck: Trachea normal and normal range of motion. Neck supple. Carotid bruit is not present. No mass and no thyromegaly present.  Cardiovascular: Normal rate, regular rhythm, S1 normal, S2 normal, normal heart sounds, intact distal pulses and normal pulses.  Exam reveals no gallop and no friction rub.   No murmur heard. Pulmonary/Chest: Effort normal and breath sounds normal. Not tachypneic. No respiratory distress. She has no decreased breath sounds. She has no wheezes. She has no rhonchi. She has no rales.  Abdominal: Soft. Normal appearance and bowel sounds are normal. There is no tenderness. There is no rebound, no guarding and no CVA tenderness.  Neurological: She is alert.  Skin: Skin is warm, dry and intact. No rash noted.       Mild erythema around right earlobe earring hole where scab is present, small nodule under hole  Psychiatric: Her speech is normal and behavior is normal. Judgment and thought content normal. Her mood appears not anxious. Cognition and memory are normal. She does not exhibit a depressed mood.          Assessment & Plan:

## 2012-05-27 NOTE — Patient Instructions (Addendum)
Push fluids. We will call you with urine culture results. Avoid urinary triggers such as caffeine, alcohol, citris, tomato, spicy foods, soda etc. Apply topical mupirocin ointment to earlobe. Warm compress twice daily.  Leave out earing until infection resolved.

## 2012-05-27 NOTE — Telephone Encounter (Signed)
3-4 days frequency and pain when urinates, ? Low back pain. Rt ear lobe draining pus from ear piercing.No fever or abd pain. Heather added to Dr Ermalene Searing at 2:45 pm today.

## 2012-05-27 NOTE — Assessment & Plan Note (Signed)
Acute urinary changes on top of chronic.Marland Kitchen Past two U cultures have been negative. UA today is contaminated. Have pt increase water, avoid bladder irritants. Will check culture to determine if antibiotics are needed.

## 2012-05-27 NOTE — Assessment & Plan Note (Signed)
Resolving. Topical bactroban and warm compresses.

## 2012-05-29 LAB — URINE CULTURE: Colony Count: 35000

## 2012-05-30 ENCOUNTER — Telehealth: Payer: Self-pay | Admitting: Family Medicine

## 2012-05-30 DIAGNOSIS — R350 Frequency of micturition: Secondary | ICD-10-CM

## 2012-05-30 NOTE — Telephone Encounter (Signed)
Notify pt no clear urinary tract infection. Push fluids, avoid bladder irritants and follow up with PCP as needed.

## 2012-05-31 ENCOUNTER — Encounter: Payer: Self-pay | Admitting: Internal Medicine

## 2012-05-31 NOTE — Telephone Encounter (Signed)
Patient advised.

## 2012-05-31 NOTE — Telephone Encounter (Signed)
Pt left v/m requesting urology referral. Contact # [phone].

## 2012-06-01 NOTE — Addendum Note (Signed)
Addended by: Kerby Nora E on: 06/01/2012 04:53 PM   Modules accepted: Orders

## 2012-06-17 ENCOUNTER — Other Ambulatory Visit (HOSPITAL_COMMUNITY): Payer: Self-pay | Admitting: Emergency Medicine

## 2012-06-17 DIAGNOSIS — Z1231 Encounter for screening mammogram for malignant neoplasm of breast: Secondary | ICD-10-CM

## 2012-06-18 ENCOUNTER — Ambulatory Visit (HOSPITAL_COMMUNITY)
Admission: RE | Admit: 2012-06-18 | Discharge: 2012-06-18 | Disposition: A | Discharge status: Home / Self Care | Payer: Medicare Other | Source: Ambulatory Visit | Attending: Emergency Medicine | Admitting: Emergency Medicine

## 2012-06-18 DIAGNOSIS — Z1231 Encounter for screening mammogram for malignant neoplasm of breast: Secondary | ICD-10-CM

## 2012-06-23 ENCOUNTER — Encounter: Payer: Self-pay | Admitting: Internal Medicine

## 2012-06-25 ENCOUNTER — Telehealth: Payer: Self-pay | Admitting: Family Medicine

## 2012-06-25 NOTE — Telephone Encounter (Signed)
If her last colonoscopy was fine-no polyps and she does not have a family per personal hx of colon cancer I am comfortable with stopping them

## 2012-06-25 NOTE — Telephone Encounter (Signed)
Pt received a wellness paper in the mail stating that no colonoscopies are needed after 77 pt is 78 would she still need to have this completed. Please call pt back.

## 2012-06-27 ENCOUNTER — Other Ambulatory Visit: Payer: Self-pay | Admitting: Family Medicine

## 2012-06-29 NOTE — Telephone Encounter (Signed)
Patient notified.  Her last colonoscopy was fine and she does not have a family hx of colon cancer so she will stop having colonoscopies.

## 2012-07-02 ENCOUNTER — Telehealth: Payer: Self-pay

## 2012-07-02 NOTE — Telephone Encounter (Signed)
Pt wanted to know when our office will begin to give flu vaccine. Advised pt meeting scheduled next week to notify when flu vaccine will be given; pt will call back next week.

## 2012-07-07 ENCOUNTER — Other Ambulatory Visit: Payer: Self-pay | Admitting: Family Medicine

## 2012-07-07 NOTE — Telephone Encounter (Signed)
Klor-Con #90 1 R sent electronic to [employer].

## 2012-07-07 NOTE — Telephone Encounter (Signed)
Klor-Con M10 MEQ #90 1 R sent to [employer].

## 2012-08-17 ENCOUNTER — Encounter: Payer: Medicare Other | Admitting: Internal Medicine

## 2012-08-24 LAB — HM DEXA SCAN: HM Dexa Scan: NORMAL

## 2012-08-30 ENCOUNTER — Encounter: Payer: Self-pay | Admitting: Family Medicine

## 2012-09-03 ENCOUNTER — Encounter: Payer: Self-pay | Admitting: *Deleted

## 2012-09-03 ENCOUNTER — Encounter: Payer: Self-pay | Admitting: Family Medicine

## 2012-09-07 DIAGNOSIS — T849XXA Unspecified complication of internal orthopedic prosthetic device, implant and graft, initial encounter: Secondary | ICD-10-CM | POA: Insufficient documentation

## 2012-10-14 DIAGNOSIS — N3281 Overactive bladder: Secondary | ICD-10-CM | POA: Insufficient documentation

## 2012-10-29 DIAGNOSIS — Z9660 Presence of unspecified orthopedic joint implant: Secondary | ICD-10-CM | POA: Insufficient documentation

## 2012-10-29 HISTORY — PX: TOTAL KNEE ARTHROPLASTY: SHX125

## 2012-11-10 ENCOUNTER — Other Ambulatory Visit: Payer: Medicare Other

## 2012-11-15 ENCOUNTER — Encounter: Payer: Medicare Other | Admitting: Family Medicine

## 2012-11-19 ENCOUNTER — Ambulatory Visit (INDEPENDENT_AMBULATORY_CARE_PROVIDER_SITE_OTHER): Payer: Medicare Other | Admitting: Internal Medicine

## 2012-11-19 ENCOUNTER — Telehealth: Payer: Self-pay | Admitting: Family Medicine

## 2012-11-19 ENCOUNTER — Encounter: Payer: Self-pay | Admitting: Internal Medicine

## 2012-11-19 VITALS — BP 110/62 | HR 55 | Temp 99.0°F

## 2012-11-19 DIAGNOSIS — R42 Dizziness and giddiness: Secondary | ICD-10-CM | POA: Insufficient documentation

## 2012-11-19 DIAGNOSIS — I471 Supraventricular tachycardia: Secondary | ICD-10-CM

## 2012-11-19 DIAGNOSIS — R1013 Epigastric pain: Secondary | ICD-10-CM

## 2012-11-19 DIAGNOSIS — K30 Functional dyspepsia: Secondary | ICD-10-CM | POA: Insufficient documentation

## 2012-11-19 LAB — CBC WITH DIFFERENTIAL/PLATELET
Basophils Absolute: 0 10*3/uL (ref 0.0–0.1)
HCT: 37.6 % (ref 36.0–46.0)
Lymphocytes Relative: 18 % (ref 12–46)
Lymphs Abs: 1.3 10*3/uL (ref 0.7–4.0)
Neutro Abs: 5.1 10*3/uL (ref 1.7–7.7)
Platelets: 338 10*3/uL (ref 150–400)
RBC: 4.22 MIL/uL (ref 3.87–5.11)
RDW: 13.1 % (ref 11.5–15.5)
WBC: 7 10*3/uL (ref 4.0–10.5)

## 2012-11-19 NOTE — Telephone Encounter (Signed)
Will evaluate at OV 

## 2012-11-19 NOTE — Assessment & Plan Note (Signed)
Last episode was years ago Will cautiously try off the beta blocker

## 2012-11-19 NOTE — Assessment & Plan Note (Signed)
Not clear cut Most likely still some constipation Will use bowel regimen to clean out for sure

## 2012-11-19 NOTE — Assessment & Plan Note (Signed)
Seems to be orthostatic Supine BP 142/96 and pulse 60 Standing 112/70  Pulse 62  Will stop atenolol and HCTZ for now No recent spells of tachycardia but did have recorded high pulse---not sure if that was after being up for a while

## 2012-11-19 NOTE — Patient Instructions (Signed)
Please stop the HCTZ and the atenolol. If you heart starts the racing again, restart the atenolol  Try senna-s 2 tabs daily and miralax 1 capful with full glass of water daily ----till your bowels really empty out. Then you can use these as needed

## 2012-11-19 NOTE — Progress Notes (Signed)
Subjective:    Patient ID: [name], female    DOB: February 01, 1934, 79 y.o.   MRN: [id number]  HPI Had 3rd TKR on left at [facility] 1/3 Then to rehab and just went home  Just discharged 2 days ago  Having some chills Couldn't check temperature Appetite is poor---relates to indigestion Bowels have been okay---went small amount today Doesn't think she has had hydrocodone in some time No heartburn  BP is fluctuating 119/68 with pulse 56 Sitting 92/64 sitting on side of bed (HR 111-123) PT there today---BP reported okay Dizzy with walking---would have to sit down No falls or syncope No symptoms while in bed or recliner  No chest pain Breathing has been okay No edema--wearing thigh high support hose  Current Outpatient Prescriptions on File Prior to Visit  Medication Sig Dispense Refill  . atenolol (TENORMIN) 50 MG tablet TAKE 1 TABLET DAILY  90 tablet  3  . Calcium Carbonate-Vitamin D (CALCIUM-VITAMIN D) 600-200 MG-UNIT CAPS Take 1 capsule by mouth 3 (three) times daily.       . cholecalciferol (VITAMIN D) 1000 UNITS tablet Take 4,000 Units by mouth daily.      . ferrous sulfate 325 (65 FE) MG tablet Take 325 mg by mouth daily with breakfast.      . hydrochlorothiazide (HYDRODIURIL) 25 MG tablet TAKE 1 TABLET DAILY  90 tablet  3  . KLOR-CON M10 10 MEQ tablet TAKE 1 TABLET DAILY  90 tablet  1  . Multiple Vitamins-Iron (MULTIVITAMIN/IRON PO) Take 1 tablet by mouth daily.         Allergies  Allergen Reactions  . Penicillins     REACTION: hives  . Sulfonamide Derivatives Hives    Past Medical History  Diagnosis Date  . Osteopenia   . Hyperlipidemia   . Hypertension   . PSVT (paroxysmal supraventricular tachycardia)   . Spinal stenosis   . Overactive bladder     urge incontinence    Past Surgical History  Procedure Date  . Cataract extraction 2008  . Bladder suspension 1993  . Joint replacement 2000    left TKR  . Total knee revision 01/12/2012    Procedure:  TOTAL KNEE REVISION;  Surgeon: [name], MD;  Location: [facility] OR;  Service: Orthopedics;  Laterality: Left;  DEPUY/ LCS , HAND SET  . Trigger finger release 01/12/2012    Procedure: RELEASE TRIGGER FINGER/A-1 PULLEY;  Surgeon: [name], MD;  Location: [facility] OR;  Service: Orthopedics;  Laterality: Right;    Family History  Problem Relation Age of Onset  . Cancer Mother     breast CA  . Heart disease Father     MI  . Diabetes Father   . Hypertension Father   . Cancer Sister     uterine CA    History   Social History  . Marital Status: Married    Spouse Name: N/A    Number of Children: N/A  . Years of Education: N/A   Occupational History  . Not on file.   Social History Main Topics  . Smoking status: Never Smoker   . Smokeless tobacco: Not on file  . Alcohol Use: No  . Drug Use: No  . Sexually Active: Not on file   Other Topics Concern  . Not on file   Social History Narrative  . No narrative on file   Review of Systems Has lost some weight Sleeping okay--hasn't needed sleeping pill (ambien) since coming home. Does use  melatonin 3mg  if she wakes and has trouble getting back to sleep No urinary problems--hasn't been on detrol    Objective:   Physical Exam  Constitutional: She appears well-developed and well-nourished. No distress.  Neck: Normal range of motion. Neck supple. No thyromegaly present.  Cardiovascular: Normal rate, regular rhythm and normal heart sounds.  Exam reveals no gallop.   No murmur heard. Pulmonary/Chest: Effort normal and breath sounds normal. No respiratory distress. She has no wheezes. She has no rales.  Abdominal: Soft. Bowel sounds are normal. She exhibits distension. There is no tenderness.       ?slight distention  Musculoskeletal: She exhibits no edema.  Lymphadenopathy:    She has no cervical adenopathy.  Skin:       Left knee incision is completely healed No signs of skin infection  Psychiatric: She has a normal mood and  affect. Her behavior is normal.          Assessment & Plan:

## 2012-11-19 NOTE — Telephone Encounter (Signed)
Patient Information:  Caller Name: Sedalia Muta  Phone: [phone]  Patient: [name], [name]  Gender: Female  DOB: 06/06/1934  Age: 79 Years  PCP: [name] [facility])  Office Follow Up:  Does the office need to follow up with this patient?: No  Instructions For The Office: N/A  RN Note:  Daughter states patient developed dizziness,onset 11/18/12. States dizzines occurs when standing or walking. RN spoke with Physical Therapist/Kelly who is present with patient. States patient is unable to ambulate due to dizziness. Patient denies chest pain or shortness of breath. Denies feeling like the room is spinning. Blood pressure 118/70, pulse 66, pulse oximeter 94 percent with room air at 1130 11/19/12. Patient states dizziness is accompanied by nausea, emesis X 1. Denies headache. Care advice given per guidelines. Call back parameters reviewed. Daughter verbalizes understanding. Daughter declines ED, states she will wait for appt. time at 1515 11/19/12. States patient is currently eating and tolerating meal.   Symptoms  Reason For Call & Symptoms: Dizziness Hx: Total knee replacement 10/29/12  Reviewed Health History In EMR: Yes  Reviewed Medications In EMR: Yes  Reviewed Allergies In EMR: Yes  Reviewed Surgeries / Procedures: Yes  Date of Onset of Symptoms: 11/18/2012  Guideline(s) Used:  Dizziness  Disposition Per Guideline:   Go to ED Now (or to Office with PCP Approval)  Reason For Disposition Reached:   Severe dizziness (e.g., unable to stand, requires support to walk, feels like passing out now)  Advice Given:  Some Causes of Temporary Dizziness:  Poor Fluid Intake - Not drinking enough fluids and being a little dehydrated is a common cause of temporary dizziness. This is always worse during hot weather.  Standing Up Suddenly - Standing up suddenly (especially getting out of bed) or prolonged standing in one place are common causes of temporary dizziness. Not drinking enough  fluids always makes it worse. Certain medications can cause or increase this type of dizziness (e.g., blood pressure medications).  Drink Fluids:  Drink several glasses of fruit juice, other clear fluids, or water. This will improve hydration and blood glucose. If you have a fever or have had heat exposure, make sure the fluids are cold.  Stand Up Slowly:  In the mornings, sit up for a few minutes before you stand up. That will help your blood flow make the adjustment.  Sit down or lie down if you feel dizzy.  Call Back If:  Passes out (faints)  You become worse.  Appointment Scheduled:  11/19/2012 15:15:00 Appointment Scheduled Provider:  Tillman Abide [facility]) ( no appts. Available with Dr. Milinda Antis)

## 2012-11-20 LAB — BASIC METABOLIC PANEL
BUN: 28 mg/dL — ABNORMAL HIGH (ref 6–23)
Calcium: 9 mg/dL (ref 8.4–10.5)
Glucose, Bld: 112 mg/dL — ABNORMAL HIGH (ref 70–99)
Sodium: 134 mEq/L — ABNORMAL LOW (ref 135–145)

## 2012-11-20 LAB — HEPATIC FUNCTION PANEL
Bilirubin, Direct: 0.1 mg/dL (ref 0.0–0.3)
Indirect Bilirubin: 0.5 mg/dL (ref 0.0–0.9)

## 2012-11-22 ENCOUNTER — Encounter: Payer: Self-pay | Admitting: Internal Medicine

## 2012-11-22 ENCOUNTER — Emergency Department (HOSPITAL_COMMUNITY)
Admission: EM | Admit: 2012-11-22 | Discharge: 2012-11-22 | Disposition: A | Discharge status: Home / Self Care | Payer: Medicare Other | Attending: Emergency Medicine | Admitting: Emergency Medicine

## 2012-11-22 ENCOUNTER — Ambulatory Visit (INDEPENDENT_AMBULATORY_CARE_PROVIDER_SITE_OTHER): Payer: Medicare Other | Admitting: Internal Medicine

## 2012-11-22 ENCOUNTER — Emergency Department (HOSPITAL_COMMUNITY): Payer: Medicare Other

## 2012-11-22 ENCOUNTER — Encounter: Payer: Self-pay | Admitting: *Deleted

## 2012-11-22 ENCOUNTER — Encounter (HOSPITAL_COMMUNITY): Payer: Self-pay

## 2012-11-22 ENCOUNTER — Telehealth: Payer: Self-pay | Admitting: Family Medicine

## 2012-11-22 VITALS — BP 85/56 | HR 85 | Temp 99.7°F

## 2012-11-22 DIAGNOSIS — Z79899 Other long term (current) drug therapy: Secondary | ICD-10-CM | POA: Insufficient documentation

## 2012-11-22 DIAGNOSIS — R42 Dizziness and giddiness: Secondary | ICD-10-CM

## 2012-11-22 DIAGNOSIS — E785 Hyperlipidemia, unspecified: Secondary | ICD-10-CM | POA: Insufficient documentation

## 2012-11-22 DIAGNOSIS — I10 Essential (primary) hypertension: Secondary | ICD-10-CM | POA: Insufficient documentation

## 2012-11-22 DIAGNOSIS — I471 Supraventricular tachycardia: Secondary | ICD-10-CM

## 2012-11-22 DIAGNOSIS — R05 Cough: Secondary | ICD-10-CM | POA: Insufficient documentation

## 2012-11-22 DIAGNOSIS — Z8739 Personal history of other diseases of the musculoskeletal system and connective tissue: Secondary | ICD-10-CM | POA: Insufficient documentation

## 2012-11-22 DIAGNOSIS — M899 Disorder of bone, unspecified: Secondary | ICD-10-CM | POA: Insufficient documentation

## 2012-11-22 DIAGNOSIS — Z87448 Personal history of other diseases of urinary system: Secondary | ICD-10-CM | POA: Insufficient documentation

## 2012-11-22 DIAGNOSIS — I498 Other specified cardiac arrhythmias: Secondary | ICD-10-CM | POA: Insufficient documentation

## 2012-11-22 DIAGNOSIS — E876 Hypokalemia: Secondary | ICD-10-CM | POA: Insufficient documentation

## 2012-11-22 DIAGNOSIS — R059 Cough, unspecified: Secondary | ICD-10-CM | POA: Insufficient documentation

## 2012-11-22 LAB — URINALYSIS, ROUTINE W REFLEX MICROSCOPIC
Bilirubin Urine: NEGATIVE
Glucose, UA: NEGATIVE mg/dL
Hgb urine dipstick: NEGATIVE
Ketones, ur: 15 mg/dL — AB
Nitrite: NEGATIVE
Protein, ur: 30 mg/dL — AB
Specific Gravity, Urine: 1.02 (ref 1.005–1.030)
Urobilinogen, UA: 0.2 mg/dL (ref 0.0–1.0)
pH: 6 (ref 5.0–8.0)

## 2012-11-22 LAB — CBC
HCT: 38.7 % (ref 36.0–46.0)
MCV: 94.4 fL (ref 78.0–100.0)
RBC: 4.1 MIL/uL (ref 3.87–5.11)
WBC: 8.8 10*3/uL (ref 4.0–10.5)

## 2012-11-22 LAB — URINE MICROSCOPIC-ADD ON

## 2012-11-22 LAB — COMPREHENSIVE METABOLIC PANEL
BUN: 17 mg/dL (ref 6–23)
CO2: 32 mEq/L (ref 19–32)
Chloride: 95 mEq/L — ABNORMAL LOW (ref 96–112)
Creatinine, Ser: 0.85 mg/dL (ref 0.50–1.10)
GFR calc Af Amer: 75 mL/min — ABNORMAL LOW (ref >90)
GFR calc non Af Amer: 64 mL/min — ABNORMAL LOW (ref >90)
Glucose, Bld: 123 mg/dL — ABNORMAL HIGH (ref 70–99)
Total Bilirubin: 0.7 mg/dL (ref 0.3–1.2)

## 2012-11-22 LAB — TROPONIN I: Troponin I: <0.3 ng/mL (ref <0.30)

## 2012-11-22 MED ORDER — LEVOFLOXACIN 500 MG PO TABS: 500.0000 mg | ORAL_TABLET | Freq: Every day | ORAL | Status: DC

## 2012-11-22 MED ORDER — POTASSIUM CHLORIDE CRYS ER 20 MEQ PO TBCR: 20.0000 meq | EXTENDED_RELEASE_TABLET | Freq: Every day | ORAL | Status: DC

## 2012-11-22 MED ORDER — SODIUM CHLORIDE 0.9 % IV BOLUS (SEPSIS)
500.0000 mL | Freq: Once | INTRAVENOUS | Status: AC
  Administered 2012-11-22: 500 mL via INTRAVENOUS

## 2012-11-22 MED ORDER — POTASSIUM CHLORIDE CRYS ER 20 MEQ PO TBCR
20.0000 meq | EXTENDED_RELEASE_TABLET | Freq: Once | ORAL | Status: AC
  Administered 2012-11-22: 20 meq via ORAL
  Filled 2012-11-22: qty 1

## 2012-11-22 MED ORDER — LEVOFLOXACIN 500 MG PO TABS
500.0000 mg | ORAL_TABLET | Freq: Once | ORAL | Status: AC
  Administered 2012-11-22: 500 mg via ORAL
  Filled 2012-11-22: qty 1

## 2012-11-22 NOTE — Assessment & Plan Note (Signed)
Seems to have a recurrence of this since her atenolol was stopped Did have hypotension last week ---labs indicate hypovolemia Now hypotensive probably due to the tachycardia Feels too bad to try outpatient Rx--and may need adenosine  Will call 911 for emergency transport Discussed with cardiology staff

## 2012-11-22 NOTE — Telephone Encounter (Signed)
Aware- she will see Dr Alphonsus Sias today

## 2012-11-22 NOTE — Telephone Encounter (Signed)
Will evaluate in office Obviously needs to be back on beta blocker

## 2012-11-22 NOTE — ED Notes (Signed)
Pt c/o general malaise x2 days, c/o vomiting x2 suday. Since Saturday, pt HR has been elevated, pt refused going to hospital. Today pt went to MD, Hr 160-170. Pt denies any cp. States "I feel ok right now." NAD noted. Pt appears weak.

## 2012-11-22 NOTE — Telephone Encounter (Signed)
[facility] Triage Call Report Triage Record Num: [id number] Operator: Reino Bellis Patient Name: [name] Call Date & Time: 11/20/2012 7:47:33PM Patient Phone: [phone] PCP: Idamae Schuller A. Tower Patient Gender: Female PCP Fax : Patient DOB: Apr 18, 1934 Practice Name: [facility] Reason for Call: Caller: [name]/Other; PCP: [name] [facility]); CB#: [phone]; Call regarding Increased weakness; [name]/Daughter calling about weakness and dizziness. Onset: 11/19/12. Pt. saw MD on 11/19/12 for same symptoms and MD took pt. off of BP meds (HCTZ and Atenolol). Pt. reports symptoms are worse today than when seen by MD yesterday (11/19/12). Only meds pt. is currently taking are Calcium, Vit D, Multivitamin, Senna, Aspirin, and Miralax. Daughter reports pt. HR was elevated earlier today (11/20/12) - at 150 so daughter gave pt. Atenolol. Reports BP has been fluctuating but last pulse was 66 @ ~7 pm. All emergent sxs r/o per Weakness or Paralysis Protocol. Advised pt. to see MD within 72 hours. Care advice given. Pt. verbalizes understanding. Protocol(s) Used: Weakness or Paralysis Recommended Outcome per Protocol: See Provider within 72 Hours Reason for Outcome: All other situations Care Advice: ~ Call provider if symptoms worsen or new symptoms develop. ~ CAUTIONS 11/20/2012 8:17:33PM Page 1 of 1 [id number]

## 2012-11-22 NOTE — Assessment & Plan Note (Signed)
Probably due to hypovolemia  Now tachycardic and fatigued---but not really dizzy in the same way Will arrange transport to [facility]

## 2012-11-22 NOTE — Telephone Encounter (Signed)
Patient Information:  Caller Name: Sedalia Muta  Phone: [phone]  Patient: [name]  Gender: Female  DOB: June 09, 1934  Age: 79 Years  PCP: [name] [facility])  Office Follow Up:  Does the office need to follow up with this patient?: No  Instructions For The Office: N/A  RN Note:  Patient was triaged this morning with a disposition to see within 72 hours in office.  Since triaged this am heart rate is currently 153 with blood pressure 100/74 otherwise no other changes. No chest pain at this time. Patient states that she feels fine per Therapist however has not gotten out of bed today.  In review of Epic note it states if heart starts racing to restart Atenolol.  Due to ED disposition contacted office triage nurse to update and see if patient should restart medication despite low blood pressure, be seen in the office, or go to Emergency room as per disposition.  Per triage nurse ok to have patient seen in office at 12:15 appointment for evaluation.  Symptoms  Reason For Call & Symptoms: Heart racing, not feeling well  Reviewed Health History In EMR: Yes  Reviewed Medications In EMR: Yes  Reviewed Allergies In EMR: Yes  Reviewed Surgeries / Procedures: Yes  Date of Onset of Symptoms: 11/22/2012  Guideline(s) Used:  Heart Rate and Heartbeat Questions  Disposition Per Guideline:   Go to ED Now  Reason For Disposition Reached:   Heart beating very rapidly (e.g., > 140 / minute) and present now (EXCEPTION: during exercise)  Advice Given:  Call Back If:  Chest pain, lightheadedness, or difficulty breathing occurs  You become worse.  Patient Refused Recommendation:  Patient Will Follow Up With Office Later  Patient will be seen in the office at 12:15 per Provider approval.

## 2012-11-22 NOTE — Telephone Encounter (Signed)
Monica with CAN [phone] calls pt still has dizziness and weak but is at rehab after knee surgery. Pt O2 sat 92% BP 110/60 P 159 after recheck BP 100/74 P 153. Pt said disposition now comes up ED. Maxine Glenn said pt said she feels OK; wanted to know if pt should keep 12:15 appt for today or go to ED. Dr Karle Starch CMA advised keep appt; if pt condition changes or worsens to call back or go to ED. Monica voiced understanding and will notify pt's daughter.

## 2012-11-22 NOTE — ED Notes (Signed)
Pt states she has not been eating/drinking much over past couple days, states she was nauseous. Denies nausea at this time.

## 2012-11-22 NOTE — Progress Notes (Signed)
Subjective:    Patient ID: [name], female    DOB: Oct 05, 1934, 79 y.o.   MRN: [id number]  HPI Here with daughters Jittery yesterday but 02 sat fine 2 large bowel movements then HR normal yesterday  This AM, increasing weakness Feels lousy Pulse 159 on pulse oximeter---therapist confirmed it was very high Daughter noticed it rapid also by auscultation Only thing she has tolerated was grape juice and slight ensure Called and told to come in here  Current Outpatient Prescriptions on File Prior to Visit  Medication Sig Dispense Refill  . Calcium Carbonate-Vitamin D (CALCIUM-VITAMIN D) 600-200 MG-UNIT CAPS Take 1 capsule by mouth 3 (three) times daily.       . cholecalciferol (VITAMIN D) 1000 UNITS tablet Take 4,000 Units by mouth daily.      Marland Kitchen HYDROcodone-acetaminophen (VICODIN) 5-500 MG per tablet Take 1 tablet by mouth every 4 (four) hours as needed.      . Melatonin 3 MG CAPS Take 1 capsule by mouth at bedtime as needed.      . Multiple Vitamins-Iron (MULTIVITAMIN/IRON PO) Take 1 tablet by mouth daily.       . sennosides-docusate sodium (SENOKOT-S) 8.6-50 MG tablet Take 2 tablets by mouth daily.         Allergies  Allergen Reactions  . Penicillins     REACTION: hives  . Sulfonamide Derivatives Hives    Past Medical History  Diagnosis Date  . Osteopenia   . Hyperlipidemia   . Hypertension   . PSVT (paroxysmal supraventricular tachycardia)   . Spinal stenosis   . Overactive bladder     urge incontinence    Past Surgical History  Procedure Date  . Cataract extraction 2008  . Bladder suspension 1993  . Joint replacement 2000    left TKR  . Total knee revision 01/12/2012    Procedure: TOTAL KNEE REVISION;  Surgeon: [name], MD;  Location: [facility] OR;  Service: Orthopedics;  Laterality: Left;  DEPUY/ LCS , HAND SET  . Trigger finger release 01/12/2012    Procedure: RELEASE TRIGGER FINGER/A-1 PULLEY;  Surgeon: [name], MD;  Location: [facility] OR;  Service:  Orthopedics;  Laterality: Right;    Family History  Problem Relation Age of Onset  . Cancer Mother     breast CA  . Heart disease Father     MI  . Diabetes Father   . Hypertension Father   . Cancer Sister     uterine CA    History   Social History  . Marital Status: Married    Spouse Name: N/A    Number of Children: N/A  . Years of Education: N/A   Occupational History  . Not on file.   Social History Main Topics  . Smoking status: Never Smoker   . Smokeless tobacco: Not on file  . Alcohol Use: No  . Drug Use: No  . Sexually Active: Not on file   Other Topics Concern  . Not on file   Social History Narrative  . No narrative on file   Review of Systems No fever No dysuria--but smells bad Some cough No SOB No chest pain    Objective:   Physical Exam  Constitutional: She appears well-developed and well-nourished.       Looks washed out  Neck: Normal range of motion.  Cardiovascular: Regular rhythm and normal heart sounds.  Exam reveals no gallop.   No murmur heard.      Tachycardia ~160  Pulmonary/Chest:  Effort normal and breath sounds normal. No respiratory distress. She has no wheezes. She has no rales.  Musculoskeletal: She exhibits no edema.  Lymphadenopathy:    She has no cervical adenopathy.          Assessment & Plan:

## 2012-11-22 NOTE — ED Provider Notes (Addendum)
History     CSN: [id number]  Arrival date & time 11/22/12  1347   First MD Initiated Contact with Patient 11/22/12 1354      Chief Complaint  Patient presents with  . SVT     (Consider location/radiation/quality/duration/timing/severity/associated sxs/prior treatment) The history is provided by the patient.  pt sent from pcp office. Hx svt, states has noted some periods rapid heart beat in past couple days, w hr in 150-160 range. Pt notes for past week generally hadnt felt well, gen malaise, poor po intake.  Had seen pcp w same a few days ago, hctz and atenolol were stopped. Pt notes no improvement in symptoms since then. Returned today w hr 150. Pt denies cp or discomfort. No sob. No faintness/syncope. Notes hx left tka 1/3, denies complications, states uneventful post op course. No fever or chills. No vomiting or diarrhea. Does not urine 'strong', no dysuria or flank pain. Non productive cough. No sore throat, runny nose, or other uri/flu like symptoms. No sob. Denies any redness, drainage or swelling in and around surgical site.     Past Medical History  Diagnosis Date  . Osteopenia   . Hyperlipidemia   . Hypertension   . PSVT (paroxysmal supraventricular tachycardia)   . Spinal stenosis   . Overactive bladder     urge incontinence    Past Surgical History  Procedure Date  . Cataract extraction 2008  . Bladder suspension 1993  . Joint replacement 2000    left TKR  . Total knee revision 01/12/2012    Procedure: TOTAL KNEE REVISION;  Surgeon: [name], MD;  Location: [facility] OR;  Service: Orthopedics;  Laterality: Left;  DEPUY/ LCS , HAND SET  . Trigger finger release 01/12/2012    Procedure: RELEASE TRIGGER FINGER/A-1 PULLEY;  Surgeon: [name], MD;  Location: [facility] OR;  Service: Orthopedics;  Laterality: Right;    Family History  Problem Relation Age of Onset  . Cancer Mother     breast CA  . Heart disease Father     MI  . Diabetes Father   . Hypertension Father     . Cancer Sister     uterine CA    History  Substance Use Topics  . Smoking status: Never Smoker   . Smokeless tobacco: Not on file  . Alcohol Use: No    OB History    Grav Para Term Preterm Abortions TAB SAB Ect Mult Living                  Review of Systems  Constitutional: Negative for fever and chills.  HENT: Negative for neck pain.   Eyes: Negative for redness.  Respiratory: Positive for cough. Negative for shortness of breath.   Cardiovascular: Negative for chest pain and leg swelling.  Gastrointestinal: Negative for abdominal pain.  Genitourinary: Negative for dysuria and flank pain.  Musculoskeletal: Negative for back pain.  Skin: Negative for rash.  Neurological: Negative for headaches.  Hematological: Does not bruise/bleed easily.  Psychiatric/Behavioral: Negative for confusion.    Allergies  Penicillins and Sulfonamide derivatives  Home Medications   Current Outpatient Rx  Name  Route  Sig  Dispense  Refill  . CALCIUM-VITAMIN D 600-200 MG-UNIT PO CAPS   Oral   Take 1 capsule by mouth 3 (three) times daily.          Marland Kitchen VITAMIN D 1000 UNITS PO TABS   Oral   Take 4,000 Units by mouth daily.         Marland Kitchen  HYDROCODONE-ACETAMINOPHEN 5-500 MG PO TABS   Oral   Take 1 tablet by mouth every 4 (four) hours as needed.         Marland Kitchen MELATONIN 3 MG PO CAPS   Oral   Take 1 capsule by mouth at bedtime as needed.         . MULTIVITAMIN/IRON PO   Oral   Take 1 tablet by mouth daily.          . SENNA-DOCUSATE SODIUM 8.6-50 MG PO TABS   Oral   Take 2 tablets by mouth daily.            There were no vitals taken for this visit.  Physical Exam  Nursing note and vitals reviewed. Constitutional: She is oriented to person, place, and time. She appears well-developed and well-nourished. No distress.  HENT:  Nose: Nose normal.  Mouth/Throat: Oropharynx is clear and moist.  Eyes: Conjunctivae normal are normal. No scleral icterus.  Neck: Neck supple. No  tracheal deviation present.  Cardiovascular: Normal rate, regular rhythm, normal heart sounds and intact distal pulses.   Pulmonary/Chest: Effort normal and breath sounds normal. No respiratory distress.  Abdominal: Soft. Normal appearance and bowel sounds are normal. She exhibits no distension. There is no tenderness.  Genitourinary:       No cva tenderness  Musculoskeletal: She exhibits no edema and no tenderness.       Left tka healing without sign of infection. Distal pulses palp.   Neurological: She is alert and oriented to person, place, and time.  Skin: Skin is warm and dry. No rash noted.  Psychiatric: She has a normal mood and affect.    ED Course  Procedures (including critical care time)   Results for orders placed during the hospital encounter of 11/22/12  CBC      Component Value Range   WBC 8.8  4.0 - 10.5 K/uL   RBC 4.10  3.87 - 5.11 MIL/uL   Hemoglobin 12.7  12.0 - 15.0 g/dL   HCT 16.1  09.6 - 04.5 %   MCV 94.4  78.0 - 100.0 fL   MCH 31.0  26.0 - 34.0 pg   MCHC 32.8  30.0 - 36.0 g/dL   RDW 40.9  81.1 - 91.4 %   Platelets 232  150 - 400 K/uL  COMPREHENSIVE METABOLIC PANEL      Component Value Range   Sodium 135  135 - 145 mEq/L   Potassium 3.4 (*) 3.5 - 5.1 mEq/L   Chloride 95 (*) 96 - 112 mEq/L   CO2 32  19 - 32 mEq/L   Glucose, Bld 123 (*) 70 - 99 mg/dL   BUN 17  6 - 23 mg/dL   Creatinine, Ser 7.82  0.50 - 1.10 mg/dL   Calcium 8.5  8.4 - 95.6 mg/dL   Total Protein 6.2  6.0 - 8.3 g/dL   Albumin 2.6 (*) 3.5 - 5.2 g/dL   AST 16  0 - 37 U/L   ALT 10  0 - 35 U/L   Alkaline Phosphatase 55  39 - 117 U/L   Total Bilirubin 0.7  0.3 - 1.2 mg/dL   GFR calc non Af Amer 64 (*) >90 mL/min   GFR calc Af Amer 75 (*) >90 mL/min  URINALYSIS, ROUTINE W REFLEX MICROSCOPIC      Component Value Range   Color, Urine AMBER (*) YELLOW   APPearance HAZY (*) CLEAR   Specific Gravity, Urine 1.020  1.005 - 1.030  pH 6.0  5.0 - 8.0   Glucose, UA NEGATIVE  NEGATIVE mg/dL    Hgb urine dipstick NEGATIVE  NEGATIVE   Bilirubin Urine NEGATIVE  NEGATIVE   Ketones, ur 15 (*) NEGATIVE mg/dL   Protein, ur 30 (*) NEGATIVE mg/dL   Urobilinogen, UA 0.2  0.0 - 1.0 mg/dL   Nitrite NEGATIVE  NEGATIVE   Leukocytes, UA SMALL (*) NEGATIVE  TROPONIN I      Component Value Range   Troponin I <0.30  <0.30 ng/mL  URINE MICROSCOPIC-ADD ON      Component Value Range   Squamous Epithelial / LPF FEW (*) RARE   WBC, UA 0-2  <3 WBC/hpf   Bacteria, UA RARE  RARE   Casts HYALINE CASTS (*) NEGATIVE   Urine-Other MUCOUS PRESENT     Dg Chest 2 View  11/22/2012  *RADIOLOGY REPORT*  Clinical Data: Cough  CHEST - 2 VIEW  Comparison: 01/05/2012  Findings: Mild patchy opacity at the lateral right lung base, possibly reflecting a mild pneumonia.  Increased interstitial markings. No pleural effusion or pneumothorax.  Cardiomegaly.  Degenerative changes of the visualized thoracolumbar spine.  IMPRESSION: Possible mild pneumonia at the lateral right lung base.   Original Report Authenticated By: Charline Bills, M.D.        MDM  Iv ns bolus. Labs.  Reviewed nursing notes and prior charts for additional history.     Date: 11/22/2012  Rate: 102  Rhythm: sinus tachycardia  QRS Axis: left  Intervals: normal  ST/T Wave abnormalities: nonspecific T wave changes  Conduction Disutrbances:none  Narrative Interpretation:   Old EKG Reviewed: unchanged  Pt remains in nsr, hr mid to low 70's, pulse ox 96-99% room air, no increased wob.  Note made of radiologist interp of cxr 'possible mild right pna' - discussed w pt. ua w small le, urine cx'd - discussed w pt.  Allergies to pcn and sulfa.  Will rx levaquin, confirmed no anticoag use w pt.    Vitals remain normal. Tolerating po fluids, given snack.   kcl po in ed for sl low k.  Given earlier svt and pcp note to restart atenolol - will restart.   Family w pt - discussed plan for close pcp f/u 1-2 days.        Suzi Roots,  MD 11/22/12 1703  Suzi Roots, MD 11/22/12 [phone]

## 2012-11-22 NOTE — ED Notes (Signed)
Per ems- pt sent from md office to be evaluated for SVT. Pt taken of atenolol and hctz 1 wk prior. Yesterday pt hr was 140-150, n/v. Pt also having trouble urinating. HR today was 170s originally. Pt received 6mg  adenosine PTA. HR now in 150s. BP-100 palpated. O2-98% on 2L. 20g IV right hand. Pt denies cp, sob, diaphoresis.

## 2012-11-22 NOTE — Telephone Encounter (Signed)
I agree Can see here if no chest pain or acute symptoms

## 2012-11-23 ENCOUNTER — Inpatient Hospital Stay (HOSPITAL_COMMUNITY)
Admission: EM | Admit: 2012-11-23 | Discharge: 2012-11-27 | DRG: 175 | Disposition: A | Discharge status: Home Health | Payer: Medicare Other | Attending: Internal Medicine | Admitting: Internal Medicine

## 2012-11-23 ENCOUNTER — Encounter (HOSPITAL_COMMUNITY): Payer: Self-pay

## 2012-11-23 ENCOUNTER — Telehealth: Payer: Self-pay | Admitting: Family Medicine

## 2012-11-23 ENCOUNTER — Emergency Department (HOSPITAL_COMMUNITY): Payer: Medicare Other

## 2012-11-23 ENCOUNTER — Inpatient Hospital Stay (HOSPITAL_COMMUNITY): Payer: Medicare Other

## 2012-11-23 DIAGNOSIS — I498 Other specified cardiac arrhythmias: Secondary | ICD-10-CM | POA: Diagnosis present

## 2012-11-23 DIAGNOSIS — Z96659 Presence of unspecified artificial knee joint: Secondary | ICD-10-CM

## 2012-11-23 DIAGNOSIS — D72818 Other decreased white blood cell count: Secondary | ICD-10-CM

## 2012-11-23 DIAGNOSIS — Z86718 Personal history of other venous thrombosis and embolism: Secondary | ICD-10-CM | POA: Diagnosis present

## 2012-11-23 DIAGNOSIS — N3941 Urge incontinence: Secondary | ICD-10-CM | POA: Diagnosis present

## 2012-11-23 DIAGNOSIS — I2699 Other pulmonary embolism without acute cor pulmonale: Secondary | ICD-10-CM

## 2012-11-23 DIAGNOSIS — L731 Pseudofolliculitis barbae: Secondary | ICD-10-CM

## 2012-11-23 DIAGNOSIS — E7800 Pure hypercholesterolemia, unspecified: Secondary | ICD-10-CM

## 2012-11-23 DIAGNOSIS — Q762 Congenital spondylolisthesis: Secondary | ICD-10-CM

## 2012-11-23 DIAGNOSIS — Z79899 Other long term (current) drug therapy: Secondary | ICD-10-CM

## 2012-11-23 DIAGNOSIS — R350 Frequency of micturition: Secondary | ICD-10-CM

## 2012-11-23 DIAGNOSIS — R42 Dizziness and giddiness: Secondary | ICD-10-CM

## 2012-11-23 DIAGNOSIS — J9600 Acute respiratory failure, unspecified whether with hypoxia or hypercapnia: Secondary | ICD-10-CM | POA: Diagnosis present

## 2012-11-23 DIAGNOSIS — I10 Essential (primary) hypertension: Secondary | ICD-10-CM | POA: Diagnosis present

## 2012-11-23 DIAGNOSIS — E785 Hyperlipidemia, unspecified: Secondary | ICD-10-CM | POA: Diagnosis present

## 2012-11-23 DIAGNOSIS — I82409 Acute embolism and thrombosis of unspecified deep veins of unspecified lower extremity: Secondary | ICD-10-CM

## 2012-11-23 DIAGNOSIS — I824Y9 Acute embolism and thrombosis of unspecified deep veins of unspecified proximal lower extremity: Secondary | ICD-10-CM | POA: Diagnosis present

## 2012-11-23 DIAGNOSIS — K30 Functional dyspepsia: Secondary | ICD-10-CM

## 2012-11-23 DIAGNOSIS — M349 Systemic sclerosis, unspecified: Secondary | ICD-10-CM

## 2012-11-23 DIAGNOSIS — M1990 Unspecified osteoarthritis, unspecified site: Secondary | ICD-10-CM

## 2012-11-23 DIAGNOSIS — R6889 Other general symptoms and signs: Secondary | ICD-10-CM

## 2012-11-23 DIAGNOSIS — R002 Palpitations: Secondary | ICD-10-CM | POA: Diagnosis present

## 2012-11-23 DIAGNOSIS — E860 Dehydration: Secondary | ICD-10-CM | POA: Diagnosis present

## 2012-11-23 DIAGNOSIS — M2140 Flat foot [pes planus] (acquired), unspecified foot: Secondary | ICD-10-CM

## 2012-11-23 DIAGNOSIS — M949 Disorder of cartilage, unspecified: Secondary | ICD-10-CM | POA: Diagnosis present

## 2012-11-23 DIAGNOSIS — N390 Urinary tract infection, site not specified: Secondary | ICD-10-CM | POA: Diagnosis present

## 2012-11-23 DIAGNOSIS — I272 Pulmonary hypertension, unspecified: Secondary | ICD-10-CM | POA: Diagnosis present

## 2012-11-23 DIAGNOSIS — D649 Anemia, unspecified: Secondary | ICD-10-CM | POA: Diagnosis present

## 2012-11-23 DIAGNOSIS — I471 Supraventricular tachycardia, unspecified: Secondary | ICD-10-CM | POA: Diagnosis present

## 2012-11-23 DIAGNOSIS — R112 Nausea with vomiting, unspecified: Secondary | ICD-10-CM | POA: Diagnosis present

## 2012-11-23 DIAGNOSIS — E876 Hypokalemia: Secondary | ICD-10-CM | POA: Diagnosis present

## 2012-11-23 DIAGNOSIS — I499 Cardiac arrhythmia, unspecified: Secondary | ICD-10-CM

## 2012-11-23 DIAGNOSIS — M48061 Spinal stenosis, lumbar region without neurogenic claudication: Secondary | ICD-10-CM

## 2012-11-23 DIAGNOSIS — M899 Disorder of bone, unspecified: Secondary | ICD-10-CM | POA: Diagnosis present

## 2012-11-23 DIAGNOSIS — B951 Streptococcus, group B, as the cause of diseases classified elsewhere: Secondary | ICD-10-CM | POA: Diagnosis present

## 2012-11-23 LAB — BASIC METABOLIC PANEL
BUN: 15 mg/dL (ref 6–23)
Chloride: 103 mEq/L (ref 96–112)
GFR calc Af Amer: >90 mL/min (ref >90)
GFR calc non Af Amer: 79 mL/min — ABNORMAL LOW (ref >90)
Potassium: 4.2 mEq/L (ref 3.5–5.1)
Sodium: 141 mEq/L (ref 135–145)

## 2012-11-23 LAB — POCT I-STAT, CHEM 8
BUN: 15 mg/dL (ref 6–23)
Calcium, Ion: 1.07 mmol/L — ABNORMAL LOW (ref 1.13–1.30)
Chloride: 100 mEq/L (ref 96–112)
Creatinine, Ser: 0.9 mg/dL (ref 0.50–1.10)
Glucose, Bld: 104 mg/dL — ABNORMAL HIGH (ref 70–99)

## 2012-11-23 LAB — CBC
MCH: 30.2 pg (ref 26.0–34.0)
MCV: 94 fL (ref 78.0–100.0)
Platelets: 201 10*3/uL (ref 150–400)
RBC: 3.81 MIL/uL — ABNORMAL LOW (ref 3.87–5.11)

## 2012-11-23 LAB — CBC WITH DIFFERENTIAL/PLATELET
Basophils Relative: 0 % (ref 0–1)
Eosinophils Absolute: 0.1 10*3/uL (ref 0.0–0.7)
Eosinophils Relative: 2 % (ref 0–5)
HCT: 37.1 % (ref 36.0–46.0)
Hemoglobin: 11.8 g/dL — ABNORMAL LOW (ref 12.0–15.0)
Lymphs Abs: 1.4 10*3/uL (ref 0.7–4.0)
MCH: 30.1 pg (ref 26.0–34.0)
MCHC: 31.8 g/dL (ref 30.0–36.0)
MCV: 94.6 fL (ref 78.0–100.0)
Monocytes Absolute: 0.7 10*3/uL (ref 0.1–1.0)
Monocytes Relative: 9 % (ref 3–12)
Neutrophils Relative %: 70 % (ref 43–77)
RBC: 3.92 MIL/uL (ref 3.87–5.11)

## 2012-11-23 LAB — URINE MICROSCOPIC-ADD ON

## 2012-11-23 LAB — URINALYSIS, ROUTINE W REFLEX MICROSCOPIC
Bilirubin Urine: NEGATIVE
Glucose, UA: NEGATIVE mg/dL
Hgb urine dipstick: NEGATIVE
Ketones, ur: NEGATIVE mg/dL
Nitrite: NEGATIVE
pH: 5.5 (ref 5.0–8.0)

## 2012-11-23 LAB — HEPATIC FUNCTION PANEL
ALT: 11 U/L (ref 0–35)
AST: 16 U/L (ref 0–37)
Bilirubin, Direct: 0.1 mg/dL (ref 0.0–0.3)
Total Protein: 6.2 g/dL (ref 6.0–8.3)

## 2012-11-23 LAB — T4, FREE: Free T4: 1.24 ng/dL (ref 0.80–1.80)

## 2012-11-23 LAB — HEPARIN LEVEL (UNFRACTIONATED): Heparin Unfractionated: 0.48 IU/mL (ref 0.30–0.70)

## 2012-11-23 LAB — TSH: TSH: 4.422 u[IU]/mL (ref 0.350–4.500)

## 2012-11-23 LAB — T3, FREE: T3, Free: 2.7 pg/mL (ref 2.3–4.2)

## 2012-11-23 MED ORDER — ENSURE COMPLETE PO LIQD
237.0000 mL | Freq: Two times a day (BID) | ORAL | Status: DC
  Administered 2012-11-23 – 2012-11-27 (×7): 237 mL via ORAL

## 2012-11-23 MED ORDER — CALCIUM-VITAMIN D 600-200 MG-UNIT PO CAPS: 1.0000 | ORAL_CAPSULE | Freq: Three times a day (TID) | ORAL | Status: DC

## 2012-11-23 MED ORDER — LEVOFLOXACIN IN D5W 750 MG/150ML IV SOLN
750.0000 mg | INTRAVENOUS | Status: DC
  Administered 2012-11-23 – 2012-11-24 (×2): 750 mg via INTRAVENOUS
  Filled 2012-11-23 (×2): qty 150

## 2012-11-23 MED ORDER — SODIUM CHLORIDE 0.9 % IJ SOLN
3.0000 mL | Freq: Two times a day (BID) | INTRAMUSCULAR | Status: DC
  Administered 2012-11-24 – 2012-11-27 (×6): 3 mL via INTRAVENOUS

## 2012-11-23 MED ORDER — ATENOLOL 25 MG PO TABS
25.0000 mg | ORAL_TABLET | Freq: Every day | ORAL | Status: DC
  Administered 2012-11-23 – 2012-11-24 (×2): 25 mg via ORAL
  Filled 2012-11-23 (×2): qty 1

## 2012-11-23 MED ORDER — SODIUM CHLORIDE 0.9 % IV SOLN
INTRAVENOUS | Status: AC
  Administered 2012-11-23: 05:00:00 via INTRAVENOUS

## 2012-11-23 MED ORDER — PANTOPRAZOLE SODIUM 40 MG PO TBEC
40.0000 mg | DELAYED_RELEASE_TABLET | Freq: Every day | ORAL | Status: DC
  Administered 2012-11-24 – 2012-11-27 (×4): 40 mg via ORAL
  Filled 2012-11-23 (×3): qty 1

## 2012-11-23 MED ORDER — ONDANSETRON HCL 4 MG/2ML IJ SOLN: 4.0000 mg | Freq: Four times a day (QID) | INTRAMUSCULAR | Status: DC | PRN

## 2012-11-23 MED ORDER — GI COCKTAIL ~~LOC~~
30.0000 mL | Freq: Once | ORAL | Status: AC
  Administered 2012-11-23: 30 mL via ORAL
  Filled 2012-11-23: qty 30

## 2012-11-23 MED ORDER — SODIUM CHLORIDE 0.9 % IV SOLN
INTRAVENOUS | Status: DC
  Administered 2012-11-23: 19:00:00 via INTRAVENOUS

## 2012-11-23 MED ORDER — PANTOPRAZOLE SODIUM 40 MG IV SOLR
40.0000 mg | Freq: Every day | INTRAVENOUS | Status: DC
  Filled 2012-11-23: qty 40

## 2012-11-23 MED ORDER — ONDANSETRON HCL 4 MG/2ML IJ SOLN
4.0000 mg | Freq: Once | INTRAMUSCULAR | Status: AC
  Administered 2012-11-23: 4 mg via INTRAVENOUS
  Filled 2012-11-23: qty 2

## 2012-11-23 MED ORDER — HEPARIN (PORCINE) IN NACL 100-0.45 UNIT/ML-% IJ SOLN
1300.0000 [IU]/h | INTRAMUSCULAR | Status: DC
  Administered 2012-11-23: 1300 [IU]/h via INTRAVENOUS
  Filled 2012-11-23 (×3): qty 250

## 2012-11-23 MED ORDER — DILTIAZEM HCL 100 MG IV SOLR: 5.0000 mg/h | INTRAVENOUS | Status: DC

## 2012-11-23 MED ORDER — ACETAMINOPHEN 325 MG PO TABS
650.0000 mg | ORAL_TABLET | Freq: Four times a day (QID) | ORAL | Status: DC | PRN
  Administered 2012-11-23: 650 mg via ORAL
  Filled 2012-11-23: qty 2

## 2012-11-23 MED ORDER — IOHEXOL 350 MG/ML SOLN
100.0000 mL | Freq: Once | INTRAVENOUS | Status: AC | PRN
  Administered 2012-11-23: 100 mL via INTRAVENOUS

## 2012-11-23 MED ORDER — ACETAMINOPHEN 650 MG RE SUPP: 650.0000 mg | Freq: Four times a day (QID) | RECTAL | Status: DC | PRN

## 2012-11-23 MED ORDER — ONDANSETRON HCL 4 MG PO TABS: 4.0000 mg | ORAL_TABLET | Freq: Four times a day (QID) | ORAL | Status: DC | PRN

## 2012-11-23 MED ORDER — SENNOSIDES-DOCUSATE SODIUM 8.6-50 MG PO TABS
2.0000 | ORAL_TABLET | Freq: Every day | ORAL | Status: DC
  Administered 2012-11-26 – 2012-11-27 (×2): 2 via ORAL
  Filled 2012-11-23 (×5): qty 2

## 2012-11-23 MED ORDER — SODIUM CHLORIDE 0.9 % IV SOLN
INTRAVENOUS | Status: DC
  Administered 2012-11-23: 03:00:00 via INTRAVENOUS

## 2012-11-23 MED ORDER — MORPHINE SULFATE 2 MG/ML IJ SOLN
2.0000 mg | INTRAMUSCULAR | Status: DC | PRN
  Administered 2012-11-23: 2 mg via INTRAVENOUS
  Filled 2012-11-23: qty 1

## 2012-11-23 MED ORDER — POLYETHYLENE GLYCOL 3350 17 G PO PACK
17.0000 g | PACK | Freq: Every day | ORAL | Status: DC | PRN
  Filled 2012-11-23: qty 1

## 2012-11-23 MED ORDER — ENOXAPARIN SODIUM 40 MG/0.4ML ~~LOC~~ SOLN
40.0000 mg | Freq: Every day | SUBCUTANEOUS | Status: DC
  Administered 2012-11-23: 40 mg via SUBCUTANEOUS
  Filled 2012-11-23: qty 0.4

## 2012-11-23 MED ORDER — CALCIUM CARBONATE-VITAMIN D 500-200 MG-UNIT PO TABS
1.0000 | ORAL_TABLET | Freq: Every day | ORAL | Status: DC
  Administered 2012-11-23 – 2012-11-27 (×5): 1 via ORAL
  Filled 2012-11-23 (×6): qty 1

## 2012-11-23 NOTE — ED Notes (Signed)
EMS- pt from home.  Was seen here earlier yesterday for SVT and was dcd home after converting and getting a liter bolus.  Pt states after being home for a while she felt her heart start racing again.  After taking her atenolol she had no relief.  Per ems pt was in 140's enroute but converted pta here to NSR.  Pt denies chest pain at this time.  18g to rac.

## 2012-11-23 NOTE — ED Notes (Signed)
Pt transported upstairs with tech and monitor applied.  Pt remains in nsr at this time and a/o x 4.

## 2012-11-23 NOTE — Progress Notes (Signed)
INITIAL NUTRITION ASSESSMENT  DOCUMENTATION CODES Per approved criteria  -Obesity Unspecified   INTERVENTION:  Ensure Complete twice daily (350 kcals, 13 gm protein per 8 fl oz bottle) RD to follow for nutrition care plan  NUTRITION DIAGNOSIS: Inadequate oral intake related to poor appetite as evidenced by family report  Goal: Oral intake with meals & supplements to meet >/= 90% of estimated nutrition needs  Monitor:  PO & supplemental intake, weight, labs, I/O's  Reason for Assessment: Malnutrition Screening Tool Report  79 y.o. female  Admitting Dx: Palpitations  ASSESSMENT: Patient presented with palpitations; in ER patient was about to be started on Cardizem infusion when rhythm reverted back to sinus; continues to have some productive cough with occasional chest pain ---> admitted for further observation.  RD spoke with patient's family at bedside; reports patient's appetite is poor; state she is "taking bites" of food; suspect some weight loss, however, unable to quantify amount; no % meal intake records available; family amenable to Ensure Complete supplements --> RD to order.  Height: Ht Readings from Last 1 Encounters:  11/23/12 5\' 6"  (1.676 m)    Weight: Wt Readings from Last 1 Encounters:  11/23/12 204 lb 1.6 oz (92.579 kg)    Ideal Body Weight: 130 lb  % Ideal Body Weight: 156%  Wt Readings from Last 10 Encounters:  11/23/12 204 lb 1.6 oz (92.579 kg)  05/27/12 201 lb 8 oz (91.4 kg)  02/19/12 198 lb (89.812 kg)  01/26/12 201 lb (91.173 kg)  01/13/12 202 lb 6.1 oz (91.8 kg)  01/13/12 202 lb 6.1 oz (91.8 kg)  01/05/12 202 lb 6.1 oz (91.8 kg)  09/12/11 202 lb 4 oz (91.74 kg)  08/20/11 201 lb (91.173 kg)  06/24/11 198 lb 12 oz (90.152 kg)    Usual Body Weight: 201 lb  % Usual Body Weight: 101%  BMI:  Body mass index is 32.94 kg/(m^2).  Estimated Nutritional Needs: Kcal: 1800-2000 Protein: 90-100 gm Fluid: 1.8-2.0 L  Skin: Intact  Diet  Order: Cardiac  EDUCATION NEEDS: -No education needs identified at this time  Last BM: 1/27  Labs:   Lab 11/23/12 0607 11/23/12 0154 11/22/12 1421 11/19/12 1626  NA 141 139 135 --  K 4.2 3.5 3.4* --  CL 103 100 95* --  CO2 30 -- 32 35*  BUN 15 15 17  --  CREATININE 0.76 0.90 0.85 --  CALCIUM 8.5 -- 8.5 9.0  MG -- -- -- --  PHOS -- -- -- --  GLUCOSE 130* 104* 123* --    Scheduled Meds:   . atenolol  25 mg Oral Daily  . calcium-vitamin D  1 tablet Oral Q breakfast  . enoxaparin (LOVENOX) injection  40 mg Subcutaneous Daily  . levofloxacin (LEVAQUIN) IV  750 mg Intravenous Q24H  . pantoprazole  40 mg Oral Daily  . senna-docusate  2 tablet Oral Daily  . sodium chloride  3 mL Intravenous Q12H    Continuous Infusions:   . sodium chloride 125 mL/hr at 11/23/12 0510    Past Medical History  Diagnosis Date  . Osteopenia   . Hyperlipidemia   . Hypertension   . PSVT (paroxysmal supraventricular tachycardia)   . Spinal stenosis   . Overactive bladder     urge incontinence    Past Surgical History  Procedure Date  . Cataract extraction 2008  . Bladder suspension 1993  . Joint replacement 2000    left TKR  . Total knee revision 01/12/2012    Procedure:  TOTAL KNEE REVISION;  Surgeon: [name], MD;  Location: [facility] OR;  Service: Orthopedics;  Laterality: Left;  DEPUY/ LCS , HAND SET  . Trigger finger release 01/12/2012    Procedure: RELEASE TRIGGER FINGER/A-1 PULLEY;  Surgeon: [name], MD;  Location: [facility] OR;  Service: Orthopedics;  Laterality: Right;  . Cholecystectomy   . Hernia repair     Maureen Chatters, RD, LDN Pager #: [phone] After-Hours Pager #: [phone]

## 2012-11-23 NOTE — Progress Notes (Signed)
Utilization review completed.  

## 2012-11-23 NOTE — Progress Notes (Signed)
  Echocardiogram 2D Echocardiogram has been performed.  [name] 11/23/2012, 4:13 PM

## 2012-11-23 NOTE — H&P (Signed)
[name] is an 79 y.o. female.   Patient was seen and examined on November 23, 2012. PCP - Dr. Lucretia Roers at [facility]. Chief Complaint: Palpitations. HPI: 79 year-old female with history of paroxysmal supraventricular tachycardia, hypertension who has had a recent left knee surgery and was in rehabilitation and come for her followup with her PCP last week. At that time patient's Tenormin and HCTZ were discontinued. Following which patient started developing palpitations over the weekend. She had come to the ER yesterday morning with palpitations. In the ER chest x-ray was showing possibility of pneumonia and patient was discharged home on oral Levaquin. After going back to home she started developing palpitations again and felt weak. EMS was called and rhythm strips were showing tachycardia concerning for SVT. In the ER patient was about to be started on Cardizem infusion when rhythm reverted back to sinus. Patient states that she still continues to have some productive cough but denies any fever chills. She occasionally has chest discomfort but denies any now. Patient has been on med for further observation.  Past Medical History  Diagnosis Date  . Osteopenia   . Hyperlipidemia   . Hypertension   . PSVT (paroxysmal supraventricular tachycardia)   . Spinal stenosis   . Overactive bladder     urge incontinence    Past Surgical History  Procedure Date  . Cataract extraction 2008  . Bladder suspension 1993  . Joint replacement 2000    left TKR  . Total knee revision 01/12/2012    Procedure: TOTAL KNEE REVISION;  Surgeon: [name], MD;  Location: [facility] OR;  Service: Orthopedics;  Laterality: Left;  DEPUY/ LCS , HAND SET  . Trigger finger release 01/12/2012    Procedure: RELEASE TRIGGER FINGER/A-1 PULLEY;  Surgeon: [name], MD;  Location: [facility] OR;  Service: Orthopedics;  Laterality: Right;  . Cholecystectomy   . Hernia repair     Family History  Problem Relation Age of Onset  . Cancer  Mother     breast CA  . Heart disease Father     MI  . Diabetes Father   . Hypertension Father   . Cancer Sister     uterine CA   Social History:  reports that she has never smoked. She does not have any smokeless tobacco history on file. She reports that she does not drink alcohol or use illicit drugs.  Allergies:  Allergies  Allergen Reactions  . Penicillins     REACTION: hives  . Sulfonamide Derivatives Hives     (Not in a hospital admission)  Results for orders placed during the hospital encounter of 11/23/12 (from the past 48 hour(s))  CBC     Status: Abnormal   Collection Time   11/23/12  1:41 AM      Component Value Range Comment   WBC 8.0  4.0 - 10.5 K/uL    RBC 3.81 (*) 3.87 - 5.11 MIL/uL    Hemoglobin 11.5 (*) 12.0 - 15.0 g/dL    HCT 16.1 (*) 09.6 - 46.0 %    MCV 94.0  78.0 - 100.0 fL    MCH 30.2  26.0 - 34.0 pg    MCHC 32.1  30.0 - 36.0 g/dL    RDW 04.5  40.9 - 81.1 %    Platelets 201  150 - 400 K/uL   POCT I-STAT TROPONIN I     Status: Normal   Collection Time   11/23/12  1:52 AM  Component Value Range Comment   Troponin i, poc 0.04  0.00 - 0.08 ng/mL    Comment 3            POCT I-STAT, CHEM 8     Status: Abnormal   Collection Time   11/23/12  1:54 AM      Component Value Range Comment   Sodium 139  135 - 145 mEq/L    Potassium 3.5  3.5 - 5.1 mEq/L    Chloride 100  96 - 112 mEq/L    BUN 15  6 - 23 mg/dL    Creatinine, Ser 9.60  0.50 - 1.10 mg/dL    Glucose, Bld 454 (*) 70 - 99 mg/dL    Calcium, Ion 0.98 (*) 1.13 - 1.30 mmol/L    TCO2 29  0 - 100 mmol/L    Hemoglobin 11.2 (*) 12.0 - 15.0 g/dL    HCT 11.9 (*) 14.7 - 46.0 %    Dg Chest 2 View  11/22/2012  *RADIOLOGY REPORT*  Clinical Data: Cough  CHEST - 2 VIEW  Comparison: 01/05/2012  Findings: Mild patchy opacity at the lateral right lung base, possibly reflecting a mild pneumonia.  Increased interstitial markings. No pleural effusion or pneumothorax.  Cardiomegaly.  Degenerative changes of the  visualized thoracolumbar spine.  IMPRESSION: Possible mild pneumonia at the lateral right lung base.   Original Report Authenticated By: Charline Bills, M.D.    Dg Chest Portable 1 View  11/23/2012  *RADIOLOGY REPORT*  Clinical Data: 79 year old female with tachycardia.  PORTABLE CHEST - 1 VIEW  Comparison: 11/22/2012 and prior chest radiographs  Findings: Cardiomegaly is again noted.  There is no evidence of focal airspace disease, pulmonary edema, suspicious pulmonary nodule/mass, pleural effusion, or pneumothorax. No acute bony abnormalities are identified.  IMPRESSION: Cardiomegaly without evidence of acute cardiopulmonary disease.   Original Report Authenticated By: Harmon Pier, M.D.     Review of Systems  Constitutional: Negative.   HENT: Negative.   Eyes: Negative.   Respiratory: Positive for cough and sputum production.   Cardiovascular: Positive for palpitations.  Gastrointestinal: Negative.   Genitourinary: Negative.   Musculoskeletal: Negative.   Skin: Negative.   Neurological: Negative.   Endo/Heme/Allergies: Negative.   Psychiatric/Behavioral: Negative.     Blood pressure 94/63, pulse 64, temperature 97.9 F (36.6 C), temperature source Oral, resp. rate 21, SpO2 96.00%. Physical Exam  Constitutional: She is oriented to person, place, and time. She appears well-developed and well-nourished. No distress.  HENT:  Head: Normocephalic and atraumatic.  Right Ear: External ear normal.  Left Ear: External ear normal.  Nose: Nose normal.  Mouth/Throat: Oropharynx is clear and moist. No oropharyngeal exudate.  Eyes: Conjunctivae normal are normal. Pupils are equal, round, and reactive to light. Right eye exhibits no discharge. Left eye exhibits no discharge. No scleral icterus.  Neck: Normal range of motion. Neck supple.  Cardiovascular: Normal rate and regular rhythm.   Respiratory: Effort normal and breath sounds normal. No respiratory distress. She has no wheezes. She has no  rales.  GI: Soft. Bowel sounds are normal. She exhibits no distension. There is no tenderness. There is no rebound and no guarding.  Musculoskeletal: She exhibits no edema and no tenderness.       Left leg in brace.  Neurological: She is alert and oriented to person, place, and time.       Moves all extremities.  Skin: Skin is warm and dry. She is not diaphoretic.  Psychiatric: Her behavior is normal.  Assessment/Plan #1. Palpitations - probably from PSVT. Her Tenormin has been restarted. Continue to monitor in telemetry. At this time we will also check thyroid function tests and given that patient had recent surgery check for d-dimer and if elevated rule out PE. Cycle cardiac markers. #2. Possible pneumonia - patient's initial chest x-ray yesterday was showing positive pneumonia a repeat chest x-ray was not showing. Given her symptoms of productive cough Levaquin will be continued. #3. Recent left knee total replacement at [facility]. #4. Mild anemia - follow CBC.  CODE STATUS - full code.  Cheralyn Oliver N. 11/23/2012, 3:53 AM

## 2012-11-23 NOTE — ED Notes (Signed)
Pt pulled up in bed.  Pt now has hr of 64 and per md we are holding on the cardizem.

## 2012-11-23 NOTE — Progress Notes (Signed)
[facility] PROGRESS NOTE  [name] GNF:[id number] DOB: 06-May-1934 DOA: 11/23/2012 PCP: [name], MD  Assessment/Plan: 1. Palpitations/ chest discomfort with h/o recent knee replacement with hospitalization: Bilateral PE/ acute DVT in the  Left popliteal vein  : started her on IV heparin. She is reluctant to start on coumadin, as she reported that her husband passed away from internal bleeding from warfarin. She would like time to think about xarelto and let us know tomorrow. Echo ordered. Palpitations improved. Chest discomfort resolved.  2. Abnormal UA: cultures pending. On levaquin.   Code Status: full code Family Communication: family at bedside Disposition Plan: possibly in 2 to 3 days.      HPI/Subjective: Comfortable, chest discomfort resolved.   Objective: Filed Vitals:   11/23/12 0315 11/23/12 0345 11/23/12 0449 11/23/12 1400  BP: 94/63 112/57 112/69 109/61  Pulse: 64 65 62 63  Temp:   98 F (36.7 C) 98.1 F (36.7 C)  TempSrc:   Oral Oral  Resp: 21 16 18 18   Height:   5\' 6"  (1.676 m)   Weight:   92.579 kg (204 lb 1.6 oz)   SpO2: 96% 97% 99% 92%    Intake/Output Summary (Last 24 hours) at 11/23/12 1842 Last data filed at 11/23/12 1400  Gross per 24 hour  Intake    480 ml  Output      0 ml  Net    480 ml   Filed Weights   11/23/12 0449  Weight: 92.579 kg (204 lb 1.6 oz)    Exam:   General:  Alert afebrile comfortable  Cardiovascular: s1s2 RRR  Respiratory: CTAB, no wheezing or rhonchi  Abdomen: soft NT ND BS+  Extremities: right lower extremity in brace.   Data Reviewed: Basic Metabolic Panel:  Lab 11/23/12 8469 11/23/12 0154 11/22/12 1421 11/19/12 1626  NA 141 139 135 134*  K 4.2 3.5 3.4* 3.2*  CL 103 100 95* 93*  CO2 30 -- 32 35*  GLUCOSE 130* 104* 123* 112*  BUN 15 15 17  28*  CREATININE 0.76 0.90 0.85 0.79  CALCIUM 8.5 -- 8.5 9.0  MG -- -- -- --  PHOS -- -- -- --   Liver Function Tests:  Lab 11/23/12 0607 11/22/12  1421 11/19/12 1626  AST 16 16 16   ALT 11 10 11   ALKPHOS 56 55 62  BILITOT 0.5 0.7 0.6  PROT 6.2 6.2 6.3  ALBUMIN 2.6* 2.6* 3.6    Lab 11/23/12 0607  LIPASE 10*  AMYLASE --   No results found for this basename: AMMONIA:5 in the last 168 hours CBC:  Lab 11/23/12 0607 11/23/12 0154 11/23/12 0141 11/22/12 1421 11/19/12 1626  WBC 7.6 -- 8.0 8.8 7.0  NEUTROABS 5.4 -- -- -- 5.1  HGB 11.8* 11.2* 11.5* 12.7 12.6  HCT 37.1 33.0* 35.8* 38.7 37.6  MCV 94.6 -- 94.0 94.4 89.1  PLT 225 -- 201 232 338   Cardiac Enzymes:  Lab 11/23/12 1210 11/23/12 0607 11/22/12 1422  CKTOTAL -- -- --  CKMB -- -- --  CKMBINDEX -- -- --  TROPONINI <0.30 <0.30 <0.30   BNP (last 3 results) No results found for this basename: PROBNP:3 in the last 8760 hours CBG: No results found for this basename: GLUCAP:5 in the last 168 hours  No results found for this or any previous visit (from the past 240 hour(s)).   Studies: Dg Chest 2 View  11/22/2012  *RADIOLOGY REPORT*  Clinical Data: Cough  CHEST - 2 VIEW  Comparison:  01/05/2012  Findings: Mild patchy opacity at the lateral right lung base, possibly reflecting a mild pneumonia.  Increased interstitial markings. No pleural effusion or pneumothorax.  Cardiomegaly.  Degenerative changes of the visualized thoracolumbar spine.  IMPRESSION: Possible mild pneumonia at the lateral right lung base.   Original Report Authenticated By: Charline Bills, M.D.    Ct Angio Chest Pe W/cm &/or Wo Cm  11/23/2012  *RADIOLOGY REPORT*  Clinical Data: Short of breath  CT ANGIOGRAPHY CHEST  Technique:  Multidetector CT imaging of the chest using the standard protocol during bolus administration of intravenous contrast. Multiplanar reconstructed images including MIPs were obtained and reviewed to evaluate the vascular anatomy.  Contrast: OMNIPAQUE IOHEXOL 350 MG/ML SOLN  Comparison: None.  Findings: There is significant right middle and lower lobe lobe are pulmonary thromboembolism  extending into segmental branches.  There is also thrombus within segmental branches of the left lower lobe pulmonary artery.  Right atrium and ventricle are dilated.  There is contrast reflux into the hepatic veins compatible with right heart strain.  Bilateral pleural effusions right greater than left are associated with dependent atelectasis.  There is a lytic bone lesion in the right side of L1 with some central calcified matrix.  No cortical breakthrough.  There is prominent posterior osteophyte at T12-L1 causing an element of spinal stenosis. T12 wedge compression deformity has a chronic appearance.  Diffuse hepatic steatosis.  No abnormal mediastinal adenopathy.  IMPRESSION: The study is positive for bilateral pulmonary thromboembolism. There are findings to suggest right heart strain.  Indeterminate L1 lytic bone lesion.  Malignancy is not excluded. MRI or bone scan can be performed to further characterize.  This may simply represent an atypical angioma.  Bilateral pleural effusions.  Critical Value/emergent results were called by telephone at the time of interpretation on 11/23/2012 at 1320 hours to Dr.Shemica Meath, who verbally acknowledged these results.   Original Report Authenticated By: Jolaine Click, M.D.    Dg Chest Portable 1 View  11/23/2012  *RADIOLOGY REPORT*  Clinical Data: 79 year old female with tachycardia.  PORTABLE CHEST - 1 VIEW  Comparison: 11/22/2012 and prior chest radiographs  Findings: Cardiomegaly is again noted.  There is no evidence of focal airspace disease, pulmonary edema, suspicious pulmonary nodule/mass, pleural effusion, or pneumothorax. No acute bony abnormalities are identified.  IMPRESSION: Cardiomegaly without evidence of acute cardiopulmonary disease.   Original Report Authenticated By: Harmon Pier, M.D.     Scheduled Meds:   . atenolol  25 mg Oral Daily  . calcium-vitamin D  1 tablet Oral Q breakfast  . feeding supplement  237 mL Oral BID BM  . levofloxacin (LEVAQUIN)  IV  750 mg Intravenous Q24H  . pantoprazole  40 mg Oral Daily  . senna-docusate  2 tablet Oral Daily  . sodium chloride  3 mL Intravenous Q12H   Continuous Infusions:   . sodium chloride 125 mL/hr at 11/23/12 0510  . heparin 1,300 Units/hr (11/23/12 1415)    Principal Problem:  *Palpitations Active Problems:  HYPERTENSION, ESSENTIAL NOS        [name]  [facility] Pager [phone]. If 8PM-8AM, please contact night-coverage at [email or web address], password [facility] 11/23/2012, 6:42 PM  LOS: 0 days

## 2012-11-23 NOTE — Progress Notes (Signed)
ANTICOAGULATION CONSULT NOTE - Initial Consult  Pharmacy Consult for Heparin Indication: pulmonary embolus  Allergies  Allergen Reactions  . Penicillins     REACTION: hives  . Sulfonamide Derivatives Hives    Patient Measurements: Height: 5\' 6"  (167.6 cm) Weight: 204 lb 1.6 oz (92.579 kg) IBW/kg (Calculated) : 59.3  Heparin Dosing Weight: 79.7 kg  Vital Signs: Temp: 98 F (36.7 C) (01/28 0449) Temp src: Oral (01/28 0449) BP: 112/69 mmHg (01/28 0449) Pulse Rate: 62  (01/28 0449)  Labs:  Basename 11/23/12 1210 11/23/12 0607 11/23/12 0154 11/23/12 0141 11/22/12 1422 11/22/12 1421  HGB -- 11.8* 11.2* -- -- --  HCT -- 37.1 33.0* 35.8* -- --  PLT -- 225 -- 201 -- 232  APTT -- -- -- -- -- --  LABPROT -- -- -- -- -- --  INR -- -- -- -- -- --  HEPARINUNFRC -- -- -- -- -- --  CREATININE -- 0.76 0.90 -- -- 0.85  CKTOTAL -- -- -- -- -- --  CKMB -- -- -- -- -- --  TROPONINI <0.30 <0.30 -- -- <0.30 --    Estimated Creatinine Clearance: 67.5 ml/min (by C-G formula based on Cr of 0.76).   Medical History: Past Medical History  Diagnosis Date  . Osteopenia   . Hyperlipidemia   . Hypertension   . PSVT (paroxysmal supraventricular tachycardia)   . Spinal stenosis   . Overactive bladder     urge incontinence    Medications:  Prescriptions prior to admission  Medication Sig Dispense Refill  . atenolol (TENORMIN) 25 MG tablet Take 25 mg by mouth daily.      . Calcium Carbonate-Vitamin D (CALCIUM-VITAMIN D) 600-200 MG-UNIT CAPS Take 1 capsule by mouth 3 (three) times daily.       . cholecalciferol (VITAMIN D) 1000 UNITS tablet Take 4,000 Units by mouth daily.      . Multiple Vitamins-Iron (MULTIVITAMIN/IRON PO) Take 1 tablet by mouth daily.       . polyethylene glycol (MIRALAX / GLYCOLAX) packet Take 17 g by mouth daily as needed. For constipation      . sennosides-docusate sodium (SENOKOT-S) 8.6-50 MG tablet Take 2 tablets by mouth daily.         Assessment: 79 y/o  female who presented to the ED on 1/27 for SVT, was treated and sent home. She came back to the ED 1/28 for recurrent SVT and admitted. Pharmacy consulted to begin heparin for B/L PE confirmed by CT chest. No bleeding noted, CBC is stable. Renal function is stable. Patient had a dose of Lovenox 40 mg Sq at 10:13 this morning.  Goal of Therapy:  Heparin level 0.3-0.7 units/ml Monitor platelets by anticoagulation protocol: Yes   Plan:  -Discontinue Lovenox 40 mg SQ daily -Begin heparin drip IV at 1300 units/hr with no bolus -Heparin level 8 hours after started -Daily heparin level and CBC while on heparin  [facility], Daphne.D., BCPS Clinical Pharmacist Pager: [phone] 11/23/2012 1:25 PM

## 2012-11-23 NOTE — Progress Notes (Signed)
ANTICOAGULATION CONSULT NOTE  Pharmacy Consult for Heparin Indication: pulmonary embolus  Allergies  Allergen Reactions  . Penicillins     REACTION: hives  . Sulfonamide Derivatives Hives    Patient Measurements: Height: 5\' 6"  (167.6 cm) Weight: 204 lb 1.6 oz (92.579 kg) IBW/kg (Calculated) : 59.3  Heparin Dosing Weight: 79.7 kg  Vital Signs: Temp: 98 F (36.7 C) (01/28 2100) Temp src: Oral (01/28 1400) BP: 125/83 mmHg (01/28 2100) Pulse Rate: 75  (01/28 2100)  Labs:  Basename 11/23/12 2244 11/23/12 1757 11/23/12 1210 11/23/12 0607 11/23/12 0154 11/23/12 0141 11/22/12 1421  HGB -- -- -- 11.8* 11.2* -- --  HCT -- -- -- 37.1 33.0* 35.8* --  PLT -- -- -- 225 -- 201 232  APTT -- -- -- -- -- -- --  LABPROT -- -- -- -- -- -- --  INR -- -- -- -- -- -- --  HEPARINUNFRC 0.48 -- -- -- -- -- --  CREATININE -- -- -- 0.76 0.90 -- 0.85  CKTOTAL -- -- -- -- -- -- --  CKMB -- -- -- -- -- -- --  TROPONINI -- <0.30 <0.30 <0.30 -- -- --    Estimated Creatinine Clearance: 67.5 ml/min (by C-G formula based on Cr of 0.76).  Assessment: 79 y/o female with PE for heparin  Goal of Therapy:  Heparin level 0.3-0.7 units/ml Monitor platelets by anticoagulation protocol: Yes   Plan:  Continue Heparin at current rate Follow-up am labs.  Geannie Risen, PharmD, BCPS  11/23/2012 11:33 PM

## 2012-11-23 NOTE — ED Notes (Signed)
Pt hr up to 140 now.  New ekg taken and given to md.  Awaiting new orders.

## 2012-11-23 NOTE — Telephone Encounter (Signed)
Pt's daughter called to inform Dr. Alphonsus Sias that pt was diagnosed with "3 blood clots in her lungs".  MD's are working on starting her on anticoagulant therapy.  She also wanted to say thank you for the follow up call that was made this morning.

## 2012-11-23 NOTE — Progress Notes (Signed)
Dr Blake Divine paged and made aware of lower extremity findings. Levonne Spiller, RN

## 2012-11-23 NOTE — ED Provider Notes (Signed)
History     CSN: [id number]  Arrival date & time 11/23/12  0045   First MD Initiated Contact with Patient 11/23/12 0132      Chief Complaint  Patient presents with  . Irregular Heart Beat    (Consider location/radiation/quality/duration/timing/severity/associated sxs/prior treatment) HPI History provided by patient and her family bedside. Had recent knee replacement a few weeks ago and since that time not doing well at home with dehydration and some nausea vomiting and in general not feeling well. She was evaluated in the emergency department earlier today for palpitations / SVT and found to be dehydrated with pneumonia. She was given antibiotics. She clinically improved in the emergency department and was discharged home with plan for close primary care followup and to start atenolol.  Tonight at home she developed recurrent palpitations with heart rate in the 160s. She took an atenolol and call her primary care physician who recommended she call EMS and be evaluated again in the emergency department. No fevers or chills. One episode of vomiting this evening prior to arrival. She had some shortness of breath that resolved with conversion to normal sinus rhythm while in route with EMS. Rhythm strip captured her arrhythmia with a rate 136.  Past Medical History  Diagnosis Date  . Osteopenia   . Hyperlipidemia   . Hypertension   . PSVT (paroxysmal supraventricular tachycardia)   . Spinal stenosis   . Overactive bladder     urge incontinence    Past Surgical History  Procedure Date  . Cataract extraction 2008  . Bladder suspension 1993  . Joint replacement 2000    left TKR  . Total knee revision 01/12/2012    Procedure: TOTAL KNEE REVISION;  Surgeon: [name], MD;  [facility];  Service: Orthopedics;  Laterality: Left;  DEPUY/ LCS , HAND SET  . Trigger finger release 01/12/2012    Procedure: RELEASE TRIGGER FINGER/A-1 PULLEY;  Surgeon: [name], MD;  Location: [facility] OR;   Service: Orthopedics;  Laterality: Right;    Family History  Problem Relation Age of Onset  . Cancer Mother     breast CA  . Heart disease Father     MI  . Diabetes Father   . Hypertension Father   . Cancer Sister     uterine CA    History  Substance Use Topics  . Smoking status: Never Smoker   . Smokeless tobacco: Not on file  . Alcohol Use: No    OB History    Grav Para Term Preterm Abortions TAB SAB Ect Mult Living                  Review of Systems  Constitutional: Negative for fever and chills.  HENT: Negative for neck pain and neck stiffness.   Eyes: Negative for pain.  Respiratory: Positive for cough.   Cardiovascular: Positive for palpitations. Negative for chest pain.  Gastrointestinal: Positive for vomiting. Negative for abdominal pain.  Genitourinary: Negative for dysuria.  Musculoskeletal: Negative for back pain.  Skin: Negative for rash.  Neurological: Negative for headaches.  All other systems reviewed and are negative.    Allergies  Penicillins and Sulfonamide derivatives  Home Medications   Current Outpatient Rx  Name  Route  Sig  Dispense  Refill  . ATENOLOL 25 MG PO TABS   Oral   Take 25 mg by mouth daily.         Marland Kitchen CALCIUM-VITAMIN D 600-200 MG-UNIT PO CAPS   Oral  Take 1 capsule by mouth 3 (three) times daily.          Marland Kitchen VITAMIN D 1000 UNITS PO TABS   Oral   Take 4,000 Units by mouth daily.         . MULTIVITAMIN/IRON PO   Oral   Take 1 tablet by mouth daily.          Marland Kitchen POLYETHYLENE GLYCOL 3350 PO PACK   Oral   Take 17 g by mouth daily as needed. For constipation         . SENNA-DOCUSATE SODIUM 8.6-50 MG PO TABS   Oral   Take 2 tablets by mouth daily.            BP 102/55  Pulse 69  Temp 97.9 F (36.6 C) (Oral)  Resp 18  SpO2 95%  Physical Exam  Constitutional: She is oriented to person, place, and time. She appears well-developed and well-nourished.  HENT:  Head: Normocephalic and atraumatic.    Eyes: Conjunctivae normal and EOM are normal. Pupils are equal, round, and reactive to light.  Neck: Trachea normal. Neck supple. No thyromegaly present.  Cardiovascular: Normal rate, regular rhythm, S1 normal, S2 normal and normal pulses.     No systolic murmur is present   No diastolic murmur is present  Pulses:      Radial pulses are 2+ on the right side, and 2+ on the left side.  Pulmonary/Chest: Effort normal and breath sounds normal. She has no wheezes. She has no rhonchi. She has no rales. She exhibits no tenderness.  Abdominal: Soft. Normal appearance and bowel sounds are normal. There is no tenderness. There is no CVA tenderness and negative Murphy's sign.  Musculoskeletal:       BLE:s Calves nontender, no cords or erythema, negative Homans sign  Neurological: She is alert and oriented to person, place, and time. She has normal strength. No cranial nerve deficit or sensory deficit. GCS eye subscore is 4. GCS verbal subscore is 5. GCS motor subscore is 6.  Skin: Skin is warm and dry. No rash noted. She is not diaphoretic.  Psychiatric: Her speech is normal.       Cooperative and appropriate    ED Course  Procedures (including critical care time)  Results for orders placed during the hospital encounter of 11/23/12  CBC      Component Value Range   WBC 8.0  4.0 - 10.5 K/uL   RBC 3.81 (*) 3.87 - 5.11 MIL/uL   Hemoglobin 11.5 (*) 12.0 - 15.0 g/dL   HCT 08.6 (*) 57.8 - 46.9 %   MCV 94.0  78.0 - 100.0 fL   MCH 30.2  26.0 - 34.0 pg   MCHC 32.1  30.0 - 36.0 g/dL   RDW 62.9  52.8 - 41.3 %   Platelets 201  150 - 400 K/uL  URINALYSIS, ROUTINE W REFLEX MICROSCOPIC      Component Value Range   Color, Urine AMBER (*) YELLOW   APPearance CLEAR  CLEAR   Specific Gravity, Urine 1.016  1.005 - 1.030   pH 5.5  5.0 - 8.0   Glucose, UA NEGATIVE  NEGATIVE mg/dL   Hgb urine dipstick NEGATIVE  NEGATIVE   Bilirubin Urine NEGATIVE  NEGATIVE   Ketones, ur NEGATIVE  NEGATIVE mg/dL   Protein,  ur NEGATIVE  NEGATIVE mg/dL   Urobilinogen, UA 0.2  0.0 - 1.0 mg/dL   Nitrite NEGATIVE  NEGATIVE   Leukocytes, UA SMALL (*) NEGATIVE  POCT  I-STAT, CHEM 8      Component Value Range   Sodium 139  135 - 145 mEq/L   Potassium 3.5  3.5 - 5.1 mEq/L   Chloride 100  96 - 112 mEq/L   BUN 15  6 - 23 mg/dL   Creatinine, Ser 1.61  0.50 - 1.10 mg/dL   Glucose, Bld 096 (*) 70 - 99 mg/dL   Calcium, Ion 0.45 (*) 1.13 - 1.30 mmol/L   TCO2 29  0 - 100 mmol/L   Hemoglobin 11.2 (*) 12.0 - 15.0 g/dL   HCT 40.9 (*) 81.1 - 91.4 %  POCT I-STAT TROPONIN I      Component Value Range   Troponin i, poc 0.04  0.00 - 0.08 ng/mL   Comment 3           URINE MICROSCOPIC-ADD ON      Component Value Range   Squamous Epithelial / LPF FEW (*) RARE   WBC, UA 7-10  <3 WBC/hpf   RBC / HPF 0-2  <3 RBC/hpf   Bacteria, UA FEW (*) RARE   Urine-Other MUCOUS PRESENT     Dg Chest 2 View   Dg Chest Portable 1 View  11/23/2012  *RADIOLOGY REPORT*  Clinical Data: 79 year old female with tachycardia.  PORTABLE CHEST - 1 VIEW  Comparison: 11/22/2012 and prior chest radiographs  Findings: Cardiomegaly is again noted.  There is no evidence of focal airspace disease, pulmonary edema, suspicious pulmonary nodule/mass, pleural effusion, or pneumothorax. No acute bony abnormalities are identified.  IMPRESSION: Cardiomegaly without evidence of acute cardiopulmonary disease.   Original Report Authenticated By: Harmon Pier, M.D.        Date: 11/23/2012  Rate: 63   Rhythm: normal sinus rhythm  QRS Axis: normal  Intervals: normal  ST/T Wave abnormalities: nonspecific ST changes  Conduction Disutrbances:none  Narrative Interpretation:   Old EKG Reviewed: changes noted previous EKG demonstrates sinus arrhythmia rate 102  Rhythm strip reviewed from EMS demonstrates narrow complex tachycardia rate 136 without obvious atrial activity - ? Atrial fibrillation   IV fluids. Cardiac monitoring.   1:59 AM patient developed mild CP. IV  morphine provided  PT developed narrow complex tachycardia rate 140, IV diltiazem and IV fluids.  2:42 AM d/w CAR fellow on call, agreed to review the EKG via fax but did not feel bedside consult was indicated, recs [place] attending can see in consult in am.   3:12 AM discussed with [facility] will evaluate for admission. Prior to getting IV diltiazem, patient reverted to normal sinus rhythm  MDM   Palpitations and arrhythmia resolved with atenolol in route. Complicated by pneumonia, vomiting and mild dehydration.  Patient evaluated with labs, EKG, imaging. Cardiology and medicine consult for admission.       Sunnie Nielsen, MD 11/23/12 [phone]

## 2012-11-23 NOTE — ED Notes (Signed)
Pt states she is unable to give urine sample. Spoke to md regarding same.  Since pt gave earlier sample we can hold off on the new one.

## 2012-11-23 NOTE — Progress Notes (Signed)
VASCULAR LAB PRELIMINARY  PRELIMINARY  PRELIMINARY  PRELIMINARY  Bilateral lower extremity venous duplex  completed.    Preliminary report:  Right:  No evidence of DVT, superficial thrombosis, or Baker's cyst.  Left: DVT noted in the popliteal vein and proximal PTV/Pero v.  No evidence of superficial thrombosis.  No Baker's cyst.   Luccia Reinheimer, RVT 11/23/2012, 4:46 PM

## 2012-11-24 DIAGNOSIS — I499 Cardiac arrhythmia, unspecified: Secondary | ICD-10-CM

## 2012-11-24 DIAGNOSIS — I2699 Other pulmonary embolism without acute cor pulmonale: Secondary | ICD-10-CM | POA: Diagnosis present

## 2012-11-24 DIAGNOSIS — I2789 Other specified pulmonary heart diseases: Secondary | ICD-10-CM

## 2012-11-24 DIAGNOSIS — Z86718 Personal history of other venous thrombosis and embolism: Secondary | ICD-10-CM | POA: Diagnosis present

## 2012-11-24 DIAGNOSIS — R350 Frequency of micturition: Secondary | ICD-10-CM

## 2012-11-24 DIAGNOSIS — I82409 Acute embolism and thrombosis of unspecified deep veins of unspecified lower extremity: Secondary | ICD-10-CM

## 2012-11-24 DIAGNOSIS — I272 Pulmonary hypertension, unspecified: Secondary | ICD-10-CM | POA: Diagnosis present

## 2012-11-24 LAB — TROPONIN I: Troponin I: <0.3 ng/mL (ref <0.30)

## 2012-11-24 LAB — CBC
HCT: 36.4 % (ref 36.0–46.0)
Hemoglobin: 11.5 g/dL — ABNORMAL LOW (ref 12.0–15.0)
MCH: 30.3 pg (ref 26.0–34.0)
MCHC: 31.6 g/dL (ref 30.0–36.0)
RBC: 3.79 MIL/uL — ABNORMAL LOW (ref 3.87–5.11)

## 2012-11-24 MED ORDER — RIVAROXABAN 15 MG PO TABS
15.0000 mg | ORAL_TABLET | Freq: Two times a day (BID) | ORAL | Status: DC
  Administered 2012-11-24 – 2012-11-27 (×7): 15 mg via ORAL
  Filled 2012-11-24 (×9): qty 1

## 2012-11-24 MED ORDER — ATENOLOL 25 MG PO TABS
25.0000 mg | ORAL_TABLET | Freq: Every day | ORAL | Status: DC
  Filled 2012-11-24: qty 1

## 2012-11-24 MED ORDER — METOPROLOL TARTRATE 1 MG/ML IV SOLN
5.0000 mg | Freq: Once | INTRAVENOUS | Status: AC
  Administered 2012-11-24: 5 mg via INTRAVENOUS
  Filled 2012-11-24: qty 5

## 2012-11-24 MED ORDER — ATENOLOL 25 MG PO TABS
25.0000 mg | ORAL_TABLET | Freq: Once | ORAL | Status: DC
  Filled 2012-11-24: qty 1

## 2012-11-24 MED ORDER — SODIUM CHLORIDE 0.9 % IV BOLUS (SEPSIS)
500.0000 mL | Freq: Once | INTRAVENOUS | Status: AC
  Administered 2012-11-24: 500 mL via INTRAVENOUS

## 2012-11-24 NOTE — Progress Notes (Signed)
Patient's HR sustaining in the 130's while patient sitting in chair;  patient asymptomatic.  Dr. Lavera Guise notified and ordered 500 cc NS bolus to be given.  Will continue to monitor. Hughes, Mitzi Hansen

## 2012-11-24 NOTE — Progress Notes (Signed)
Pt HR sustaining 130's despite IV metoprolol. Pt asymptomatic. MD notified awaiting for call back will continue to monitor.

## 2012-11-24 NOTE — Progress Notes (Signed)
Spoke to Blair PharmD., she stated it is ok to give to stop iv heparin then given give po xarelto with out a waiting period.

## 2012-11-24 NOTE — Progress Notes (Signed)
Pt HR 130's. MD notified, new orders received will continue to monitor.

## 2012-11-24 NOTE — Telephone Encounter (Signed)
Called her again She is feeling some better today

## 2012-11-24 NOTE — Progress Notes (Signed)
ANTICOAGULATION CONSULT NOTE  Pharmacy Consult for Heparin Indication: pulmonary embolus  Allergies  Allergen Reactions  . Penicillins     REACTION: hives  . Sulfonamide Derivatives Hives    Patient Measurements: Height: 5\' 6"  (167.6 cm) Weight: 205 lb 14.6 oz (93.4 kg) IBW/kg (Calculated) : 59.3  Heparin Dosing Weight: 79.7 kg  Vital Signs: Temp: 97.4 F (36.3 C) (01/29 0539) Temp src: Oral (01/29 0539) BP: 110/67 mmHg (01/29 0948) Pulse Rate: 61  (01/29 0948)  Labs:  Alvira Philips 11/24/12 0535 11/23/12 2244 11/23/12 1757 11/23/12 1210 11/23/12 0607 11/23/12 0154 11/23/12 0141 11/22/12 1421  HGB 11.5* -- -- -- 11.8* -- -- --  HCT 36.4 -- -- -- 37.1 33.0* -- --  PLT 207 -- -- -- 225 -- 201 --  APTT -- -- -- -- -- -- -- --  LABPROT -- -- -- -- -- -- -- --  INR -- -- -- -- -- -- -- --  HEPARINUNFRC 0.55 0.48 -- -- -- -- -- --  CREATININE -- -- -- -- 0.76 0.90 -- 0.85  CKTOTAL -- -- -- -- -- -- -- --  CKMB -- -- -- -- -- -- -- --  TROPONINI -- -- <0.30 <0.30 <0.30 -- -- --    Estimated Creatinine Clearance: 67.8 ml/min (by C-G formula based on Cr of 0.76).  Assessment: 42 yof continues on IV heparin for PE. Heparin level remains therapeutic today at 0.55. Pt is slightly anemic but plts are WNL. No bleeding noted. Pt currently deciding on oral anticoagulant.   Goal of Therapy:  Heparin level 0.3-0.7 units/ml Monitor platelets by anticoagulation protocol: Yes   Plan:  1. Continue heparin at 1300 units/hr 2. F/u AM heparin level and CBC 3. F/u oral anticoagulation  Lysle Pearl, PharmD, BCPS Pager # [phone] 11/24/2012 10:23 AM

## 2012-11-24 NOTE — Evaluation (Signed)
Physical Therapy Evaluation Patient Details Name: [name] MRN: [id number] DOB: [birth year] Today's Date: 11/24/2012 Time: 2130-8657 PT Time Calculation (min): 32 min  PT Assessment / Plan / Recommendation Clinical Impression  Pt adm with PE and DVT.  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  [facility].  Pt should be able to return home with intermittent assist of family.    PT Assessment  Patient needs continued PT services    Follow Up Recommendations  Home health PT    Does the patient have the potential to tolerate intense rehabilitation      Barriers to Discharge        Equipment Recommendations  None recommended by PT    Recommendations for Other Services     Frequency Min 3X/week    Precautions / Restrictions Precautions Precautions: Knee;Fall Required Braces or Orthoses: Knee Immobilizer - Left Knee Immobilizer - Left: On at all times Restrictions Weight Bearing Restrictions: Yes LLE Weight Bearing: Weight bearing as tolerated Other Position/Activity Restrictions: No range of motion of lt knee   Pertinent Vitals/Pain SaO2 87% on RA at rest; SaO2 95% on 1L at rest.      Mobility  Bed Mobility Bed Mobility: Supine to Sit;Sitting - Scoot to Edge of Bed Supine to Sit: 6: Modified independent (Device/Increase time);HOB flat Sitting - Scoot to Edge of Bed: 6: Modified independent (Device/Increase time) Details for Bed Mobility Assistance: Incr time and effort Transfers Transfers: Sit to Stand;Stand to Sit Sit to Stand: 4: Min guard;With upper extremity assist;From bed Stand to Sit: 5: Supervision;With armrests;To bed Ambulation/Gait Ambulation/Gait Assistance: 5: Supervision Ambulation Distance (Feet): 125 Feet Assistive device: Rolling walker Ambulation/Gait Assistance Details: Pt required 1 standing rest break. Gait Pattern: Step-through pattern;Decreased stride length    Shoulder Instructions       Exercises     PT Diagnosis: Difficulty walking  PT Problem List: Decreased mobility;Decreased activity tolerance PT Treatment Interventions: DME instruction;Gait training;Functional mobility training;Patient/family education;Therapeutic activities;Therapeutic exercise   PT Goals Acute Rehab PT Goals PT Goal Formulation: With patient Time For Goal Achievement: 12/01/12 Potential to Achieve Goals: Good Pt will go Sit to Stand: with modified independence PT Goal: Sit to Stand - Progress: Goal set today Pt will go Stand to Sit: with modified independence PT Goal: Stand to Sit - Progress: Goal set today Pt will Ambulate: >150 feet;with modified independence PT Goal: Ambulate - Progress: Goal set today  Visit Information  Last PT Received On: 11/24/12 Assistance Needed: +1    Subjective Data  Subjective: Pt states she was doing well at home. Patient Stated Goal: return home   Prior Functioning  Home Living Lives With: Alone (daughter lives next door and has been staying with pt) Available Help at Discharge: Family;Available 24 hours/day Type of Home: House Home Access: Stairs to enter [employer] of Steps: 4 Entrance Stairs-Rails: Left;Right Home Layout: One level Bathroom Shower/Tub: Health visitor: Handicapped height Bathroom Accessibility: Yes How Accessible: Accessible via walker Home Adaptive Equipment: Walker - rolling Prior Function Level of Independence: Independent with assistive device(s) Driving: No (not since surgery earlier this month) Vocation: Retired Musician: No difficulties    Cognition  Overall Cognitive Status: Appears within functional limits for tasks assessed/performed Arousal/Alertness: Awake/alert Orientation Level: Appears intact for tasks assessed Behavior During Session: [facility] for tasks performed    Extremity/Trunk Assessment Right Lower Extremity Assessment RLE ROM/Strength/Tone: [facility] for tasks  assessed Left Lower Extremity Assessment  LLE ROM/Strength/Tone: Unable to fully assess;Due to precautions (able to independently straight leg raise)   Balance    End of Session PT - End of Session Activity Tolerance: Patient limited by fatigue Patient left: in bed;with call bell/phone within reach Nurse Communication: Mobility status  GP     [facility] 11/24/2012, 11:50 AM  [facility] PT [phone]

## 2012-11-24 NOTE — Progress Notes (Signed)
[facility] PROGRESS NOTE  [name]:[id number] DOB: [birth year] DOA: 11/23/2012 PCP: [name], MD  Assessment/Plan: 1. Bilateral PE/ acute DVT in the  Left popliteal vein  : started on IV heparin after diagnosis as she was reluctant to start on oral anticoagulants, as she reported that her husband passed away from internal bleeding from warfarin. After risk / benefits explained patient started on po xarelto on 11/24/12. Palpitations improved. Chest discomfort resolved. Echocardiogram revealing elevated pulm artery pressure - monitor closely with ambulation . Would take off all anihypertensives for now until she recovers. Continue with oxygen supplementation  2. Abnormal UA: cultures pending. Received iv levaquin for 2 days   3. S/p recent TKR - PT eval to see if she needs to return to snf  4. Hypokalemia - repleted   Code Status: full code Family Communication: patient Disposition Plan: ? snf vs home       HPI/Subjective: Comfortable, weak   Objective: Filed Vitals:   11/23/12 1400 11/23/12 2100 11/24/12 0539 11/24/12 0948  BP: 109/61 125/83 131/80 110/67  Pulse: 63 75 67 61  Temp: 98.1 F (36.7 C) 98 F (36.7 C) 97.4 F (36.3 C)   TempSrc: Oral  Oral   Resp: 18 18 17    Height:      Weight:   93.4 kg (205 lb 14.6 oz)   SpO2: 92% 94% 91%     Intake/Output Summary (Last 24 hours) at 11/24/12 1107 Last data filed at 11/24/12 0900  Gross per 24 hour  Intake    480 ml  Output    800 ml  Net   -320 ml   Filed Weights   11/23/12 0449 11/24/12 0539  Weight: 92.579 kg (204 lb 1.6 oz) 93.4 kg (205 lb 14.6 oz)    Exam:   General:  Alert afebrile comfortable  Cardiovascular: s1s2 RRR  Respiratory: CTAB, no wheezing or rhonchi  Abdomen: soft NT ND BS+  Extremities: right lower extremity in brace.   Data Reviewed:  2d echo  Study Conclusions  - Left ventricle: The cavity size was normal. Wall thickness was normal. Systolic function was normal. The  estimated ejection fraction was in the range of 55% to 60%. Wall motion was normal; there were no regional wall motion abnormalities. Doppler parameters are consistent with abnormal left ventricular relaxation (grade 1 diastolic dysfunction). - Ventricular septum: The contour showed diastolic and systolic flattening consistent with presure/volume overload. - Left atrium: The atrium was mildly dilated. - Right ventricle: The cavity size was moderately dilated. Systolic function was mildly reduced. Systolic pressure was increased. - Right atrium: The atrium was moderately to severely dilated. - Atrial septum: No defect or patent foramen ovale was identified. - Tricuspid valve: Severe regurgitation. - Pulmonary arteries: PA peak pressure: 82mm Hg (S). Impressions:  - The right ventricular systolic pressure was increased consistent with severe pulmonary hypertension.     Basic Metabolic Panel:  Lab 11/23/12 8119 11/23/12 0154 11/22/12 1421 11/19/12 1626  NA 141 139 135 134*  K 4.2 3.5 3.4* 3.2*  CL 103 100 95* 93*  CO2 30 -- 32 35*  GLUCOSE 130* 104* 123* 112*  BUN 15 15 17  28*  CREATININE 0.76 0.90 0.85 0.79  CALCIUM 8.5 -- 8.5 9.0  MG -- -- -- --  PHOS -- -- -- --   Liver Function Tests:  Lab 11/23/12 0607 11/22/12 1421 11/19/12 1626  AST 16 16 16   ALT 11 10 11   ALKPHOS 56 55 62  BILITOT 0.5 0.7 0.6  PROT 6.2 6.2 6.3  ALBUMIN 2.6* 2.6* 3.6    Lab 11/23/12 0607  LIPASE 10*  AMYLASE --   No results found for this basename: AMMONIA:5 in the last 168 hours CBC:  Lab 11/24/12 0535 11/23/12 0607 11/23/12 0154 11/23/12 0141 11/22/12 1421 11/19/12 1626  WBC 7.8 7.6 -- 8.0 8.8 7.0  NEUTROABS -- 5.4 -- -- -- 5.1  HGB 11.5* 11.8* 11.2* 11.5* 12.7 --  HCT 36.4 37.1 33.0* 35.8* 38.7 --  MCV 96.0 94.6 -- 94.0 94.4 89.1  PLT 207 225 -- 201 232 338   Cardiac Enzymes:  Lab 11/23/12 1757 11/23/12 1210 11/23/12 0607 11/22/12 1422  CKTOTAL -- -- -- --  CKMB -- -- --  --  CKMBINDEX -- -- -- --  TROPONINI <0.30 <0.30 <0.30 <0.30   BNP (last 3 results) No results found for this basename: PROBNP:3 in the last 8760 hours CBG: No results found for this basename: GLUCAP:5 in the last 168 hours  Recent Results (from the past 240 hour(s))  URINE CULTURE     Status: Normal (Preliminary result)   Collection Time   11/22/12  4:09 PM      Component Value Range Status Comment   Specimen Description URINE, RANDOM   Final    Special Requests Normal   Final    Culture  Setup Time 11/23/2012 00:16   Final    Colony Count PENDING   Incomplete    Culture Culture reincubated for better growth   Final    Report Status PENDING   Incomplete      Studies: Dg Chest 2 View  11/22/2012  *RADIOLOGY REPORT*  Clinical Data: Cough  CHEST - 2 VIEW  Comparison: 01/05/2012  Findings: Mild patchy opacity at the lateral right lung base, possibly reflecting a mild pneumonia.  Increased interstitial markings. No pleural effusion or pneumothorax.  Cardiomegaly.  Degenerative changes of the visualized thoracolumbar spine.  IMPRESSION: Possible mild pneumonia at the lateral right lung base.   Original Report Authenticated By: Charline Bills, M.D.    Ct Angio Chest Pe W/cm &/or Wo Cm  11/23/2012  *RADIOLOGY REPORT*  Clinical Data: Short of breath  CT ANGIOGRAPHY CHEST  Technique:  Multidetector CT imaging of the chest using the standard protocol during bolus administration of intravenous contrast. Multiplanar reconstructed images including MIPs were obtained and reviewed to evaluate the vascular anatomy.  Contrast: OMNIPAQUE IOHEXOL 350 MG/ML SOLN  Comparison: None.  Findings: There is significant right middle and lower lobe lobe are pulmonary thromboembolism extending into segmental branches.  There is also thrombus within segmental branches of the left lower lobe pulmonary artery.  Right atrium and ventricle are dilated.  There is contrast reflux into the hepatic veins compatible with  right heart strain.  Bilateral pleural effusions right greater than left are associated with dependent atelectasis.  There is a lytic bone lesion in the right side of L1 with some central calcified matrix.  No cortical breakthrough.  There is prominent posterior osteophyte at T12-L1 causing an element of spinal stenosis. T12 wedge compression deformity has a chronic appearance.  Diffuse hepatic steatosis.  No abnormal mediastinal adenopathy.  IMPRESSION: The study is positive for bilateral pulmonary thromboembolism. There are findings to suggest right heart strain.  Indeterminate L1 lytic bone lesion.  Malignancy is not excluded. MRI or bone scan can be performed to further characterize.  This may simply represent an atypical angioma.  Bilateral pleural effusions.  Critical Value/emergent results  were called by telephone at the time of interpretation on 11/23/2012 at 1320 hours to Dr.Akula, who verbally acknowledged these results.   Original Report Authenticated By: Jolaine Click, M.D.    Dg Chest Portable 1 View  11/23/2012  *RADIOLOGY REPORT*  Clinical Data: 79 year old female with tachycardia.  PORTABLE CHEST - 1 VIEW  Comparison: 11/22/2012 and prior chest radiographs  Findings: Cardiomegaly is again noted.  There is no evidence of focal airspace disease, pulmonary edema, suspicious pulmonary nodule/mass, pleural effusion, or pneumothorax. No acute bony abnormalities are identified.  IMPRESSION: Cardiomegaly without evidence of acute cardiopulmonary disease.   Original Report Authenticated By: Harmon Pier, M.D.     Scheduled Meds:    . calcium-vitamin D  1 tablet Oral Q breakfast  . feeding supplement  237 mL Oral BID BM  . pantoprazole  40 mg Oral Daily  . rivaroxaban  15 mg Oral BID  . senna-docusate  2 tablet Oral Daily  . sodium chloride  3 mL Intravenous Q12H   Continuous Infusions:    Principal Problem:  *PE (pulmonary embolism) Active Problems:  HYPERCHOLESTEROLEMIA, PURE   HYPOKALEMIA  HYPERTENSION, ESSENTIAL NOS  PSVT  DVT (deep venous thrombosis)  Pulmonary hypertension, moderate to severe        Shaquitta Burbridge  [facility] Pager [phone]. If 7PM-7AM, please contact night-coverage at [email or web address], password [facility] 11/24/2012, 11:07 AM  LOS: 1 day

## 2012-11-25 DIAGNOSIS — I471 Supraventricular tachycardia: Secondary | ICD-10-CM

## 2012-11-25 LAB — URINE CULTURE: Special Requests: NORMAL

## 2012-11-25 LAB — BASIC METABOLIC PANEL
BUN: 12 mg/dL (ref 6–23)
CO2: 27 mEq/L (ref 19–32)
Chloride: 103 mEq/L (ref 96–112)
Creatinine, Ser: 0.73 mg/dL (ref 0.50–1.10)
GFR calc Af Amer: >90 mL/min (ref >90)
Potassium: 3.7 mEq/L (ref 3.5–5.1)

## 2012-11-25 LAB — CBC
HCT: 34.3 % — ABNORMAL LOW (ref 36.0–46.0)
MCH: 29.9 pg (ref 26.0–34.0)
MCV: 95.8 fL (ref 78.0–100.0)
Platelets: 211 10*3/uL (ref 150–400)
RBC: 3.58 MIL/uL — ABNORMAL LOW (ref 3.87–5.11)
RDW: 13.3 % (ref 11.5–15.5)
WBC: 6.4 10*3/uL (ref 4.0–10.5)

## 2012-11-25 LAB — MAGNESIUM: Magnesium: 1.8 mg/dL (ref 1.5–2.5)

## 2012-11-25 MED ORDER — ATENOLOL 25 MG PO TABS
25.0000 mg | ORAL_TABLET | Freq: Once | ORAL | Status: AC
  Administered 2012-11-25: 25 mg via ORAL
  Filled 2012-11-25: qty 1

## 2012-11-25 MED ORDER — ATENOLOL 25 MG PO TABS: 25.0000 mg | ORAL_TABLET | Freq: Every day | ORAL | Status: DC

## 2012-11-25 MED ORDER — ATENOLOL 25 MG PO TABS
25.0000 mg | ORAL_TABLET | Freq: Two times a day (BID) | ORAL | Status: DC
  Administered 2012-11-25 – 2012-11-26 (×2): 25 mg via ORAL
  Filled 2012-11-25 (×4): qty 1

## 2012-11-25 MED ORDER — METOPROLOL TARTRATE 1 MG/ML IV SOLN
5.0000 mg | Freq: Once | INTRAVENOUS | Status: AC
  Administered 2012-11-25: 5 mg via INTRAVENOUS
  Filled 2012-11-25: qty 5

## 2012-11-25 MED ORDER — ATENOLOL 25 MG PO TABS
25.0000 mg | ORAL_TABLET | Freq: Two times a day (BID) | ORAL | Status: DC
Start: 2012-11-25 — End: 2012-11-25
  Filled 2012-11-25 (×2): qty 1

## 2012-11-25 MED ORDER — CIPROFLOXACIN HCL 500 MG PO TABS
500.0000 mg | ORAL_TABLET | Freq: Two times a day (BID) | ORAL | Status: DC
  Filled 2012-11-25 (×3): qty 1

## 2012-11-25 NOTE — Progress Notes (Signed)
Pt hr 140. Pt feels palpitations. MD notified awaiting call back will continue to monitor.

## 2012-11-25 NOTE — Progress Notes (Signed)
Physical Therapy Treatment Patient Details Name: [name] MRN: [id number] DOB: [birth year] Today's Date: 11/25/2012 Time: 7846-9629 PT Time Calculation (min): 27 min  PT Assessment / Plan / Recommendation Comments on Treatment Session  pt became fatigue doing her exercises with mild dyspnea and when checked EHR to 135 sats at 98%    Follow Up Recommendations  Home health PT     Does the patient have the potential to tolerate intense rehabilitation     Barriers to Discharge        Equipment Recommendations  None recommended by PT    Recommendations for Other Services    Frequency Min 3X/week   Plan Discharge plan remains appropriate;Frequency remains appropriate    Precautions / Restrictions Precautions Precautions: Knee;Fall Required Braces or Orthoses: Knee Immobilizer - Left Knee Immobilizer - Left: On at all times Restrictions Weight Bearing Restrictions: Yes LLE Weight Bearing: Weight bearing as tolerated Other Position/Activity Restrictions: No range of motion of lt knee   Pertinent Vitals/Pain See comments above    Mobility  Bed Mobility Supine to Sit: 6: Modified independent (Device/Increase time);HOB flat Sitting - Scoot to Edge of Bed: 6: Modified independent (Device/Increase time) Details for Bed Mobility Assistance: incr time Transfers Transfers: Sit to Stand;Stand to Sit Sit to Stand: 4: Min assist;With upper extremity assist;From bed Stand to Sit: 4: Min assist;With upper extremity assist;To chair/3-in-1 Details for Transfer Assistance: vc's for hand placement, positioning of the RW  Ambulation/Gait Ambulation/Gait Assistance: 5: Supervision Ambulation Distance (Feet): 160 Feet Assistive device: Rolling walker Ambulation/Gait Assistance Details: Initially noted HIp drop, but determined the cause to be the immobilizer rubbing her R legs Gait Pattern: Step-through pattern;Decreased stride length Stairs: No    Exercises General Exercises - Lower  Extremity Ankle Circles/Pumps: 10 reps;Seated;Both Quad Sets: AROM;Both;15 reps;Seated Hip ABduction/ADduction: Strengthening;Both;15 reps;Seated Straight Leg Raises: AROM;AAROM;Both;10 reps Other Exercises Other Exercises: chair push ups x 6 reps   PT Diagnosis:    PT Problem List:   PT Treatment Interventions:     PT Goals Acute Rehab PT Goals PT Goal Formulation: With patient Time For Goal Achievement: 12/01/12 Potential to Achieve Goals: Good PT Goal: Sit to Stand - Progress: Progressing toward goal PT Goal: Stand to Sit - Progress: Progressing toward goal PT Goal: Ambulate - Progress: Progressing toward goal  Visit Information  Last PT Received On: 11/25/12 Assistance Needed: +1    Subjective Data  Subjective: I forget to breathe when I do exercises   Cognition  Overall Cognitive Status: Appears within functional limits for tasks assessed/performed Arousal/Alertness: Awake/alert Orientation Level: Appears intact for tasks assessed Behavior During Session: [facility] for tasks performed    Balance     End of Session PT - End of Session Equipment Utilized During Treatment: Left knee immobilizer Activity Tolerance: Patient limited by fatigue Patient left: in chair;with call bell/phone within reach Nurse Communication: Mobility status   GP     Jimi Giza, Eliseo Gum 11/25/2012, 4:44 PM 11/25/2012  [place] Bing, PT [phone] (pager)

## 2012-11-25 NOTE — Progress Notes (Signed)
Pt spontaneously converted to SR hr 67. MD updated and order to not give atenolol at this time. Will continue to monitor.

## 2012-11-25 NOTE — Evaluation (Signed)
Occupational Therapy Evaluation Patient Details Name: [name] MRN: [id number] DOB: 08/30/1934 Today's Date: 11/25/2012 Time: 7829-5621 OT Time Calculation (min): 20 min  OT Assessment / Plan / Recommendation Clinical Impression  79 yo female recently d/c from SNF s/p Lt TKA revision now admitted with DVT and PE. Pt progressing well and should be able to d/c home from acute from OT standpoint. OT to follow acutely for activity tolerance and RW safety    OT Assessment  Patient needs continued OT Services    Follow Up Recommendations  No OT follow up    Barriers to Discharge      Equipment Recommendations  None recommended by OT    Recommendations for Other Services    Frequency  Min 2X/week    Precautions / Restrictions Precautions Precautions: Knee;Fall Required Braces or Orthoses: Knee Immobilizer - Left Knee Immobilizer - Left: On at all times Restrictions Weight Bearing Restrictions: Yes LLE Weight Bearing: Weight bearing as tolerated   Pertinent Vitals/Pain     ADL  Grooming: Wash/dry hands;Supervision/safety Where Assessed - Grooming: Unsupported standing (min v/c for safety with RW) Lower Body Bathing: Minimal assistance Where Assessed - Lower Body Bathing: Unsupported sitting (daughter assisting at home PTA) Toilet Transfer: Minimal assistance Toilet Transfer Method: Sit to stand Toilet Transfer Equipment: Raised toilet seat with arms (or 3-in-1 over toilet) Equipment Used: Knee Immobilizer;Gait belt;Rolling walker Transfers/Ambulation Related to ADLs: Pt completed bed transfer to stand Min (A). pt with decreased control stand<>sit in recliner with built up surface. Pt provided Min V/c for safety ADL Comments: Pt completed bed mobility, grooming at sink and ambulation to demonstrate ability to basic transfer to bathroom distance and back at home. pt progressing well at this time. Pt has assistance of daughter at d/c    OT Diagnosis: Generalized weakness  OT  Problem List: Decreased strength;Decreased activity tolerance;Impaired balance (sitting and/or standing);Decreased safety awareness;Decreased knowledge of use of DME or AE OT Treatment Interventions: Self-care/ADL training;DME and/or AE instruction;Therapeutic activities;Balance training;Patient/family education   OT Goals Acute Rehab OT Goals OT Goal Formulation: With patient Time For Goal Achievement: 12/09/12 Potential to Achieve Goals: Good ADL Goals Pt Will Perform Lower Body Bathing: with set-up;Sit to stand from chair ADL Goal: Lower Body Bathing - Progress: Goal set today Pt Will Perform Lower Body Dressing: with set-up;Sit to stand from chair ADL Goal: Lower Body Dressing - Progress: Goal set today Pt Will Transfer to Toilet: with set-up;3-in-1;Ambulation ADL Goal: Toilet Transfer - Progress: Goal set today Miscellaneous OT Goals Miscellaneous OT Goal #1: Pt will complete shower transfer supervision level as precursor to adls OT Goal: Miscellaneous Goal #1 - Progress: Goal set today  Visit Information  Last OT Received On: 11/25/12 Assistance Needed: +1    Subjective Data  Subjective: "I can't tell what is what " when asked how she is feeling. pt reports "good " with pain questioning Patient Stated Goal: no pain to report. "the knee hasn't hurt since surgery"   Prior Functioning     Home Living Lives With: Alone Available Help at Discharge: Family;Available 24 hours/day Type of Home: House Home Access: Stairs to enter [employer] of Steps: 4 Entrance Stairs-Rails: Left;Right Home Layout: One level Bathroom Shower/Tub: Health visitor: Handicapped height Bathroom Accessibility: Yes How Accessible: Accessible via walker Home Adaptive Equipment: Walker - rolling Prior Function Level of Independence: Independent with assistive device(s) Driving: No Vocation: Retired Musician: No difficulties Dominant Hand: Right  Vision/Perception     Cognition  Overall Cognitive Status: Appears within functional limits for tasks assessed/performed Arousal/Alertness: Awake/alert Orientation Level: Appears intact for tasks assessed Behavior During Session: [facility] for tasks performed    Extremity/Trunk Assessment Right Upper Extremity Assessment RUE ROM/Strength/Tone: Within functional levels RUE Sensation: WFL - Light Touch RUE Coordination: WFL - gross/fine motor Left Upper Extremity Assessment LUE ROM/Strength/Tone: Within functional levels LUE Sensation: WFL - Light Touch LUE Coordination: WFL - gross/fine motor Trunk Assessment Trunk Assessment: Normal     Mobility Bed Mobility Supine to Sit: 6: Modified independent (Device/Increase time);HOB flat Sitting - Scoot to Edge of Bed: 6: Modified independent (Device/Increase time) Details for Bed Mobility Assistance: incr time Transfers Sit to Stand: 4: Min assist;With upper extremity assist;From bed Stand to Sit: 4: Min assist;With upper extremity assist;To chair/3-in-1 (control descend to chair)     Shoulder Instructions     Exercise     Balance     End of Session OT - End of Session Activity Tolerance: Patient tolerated treatment well Patient left: in chair;with call bell/phone within reach Nurse Communication: Mobility status;Precautions  GO     Lucile Shutters 11/25/2012, 3:36 PM Pager: [phone]

## 2012-11-25 NOTE — Care Management Note (Signed)
    Page 1 of 2   11/25/2012     2:44:34 PM   CARE MANAGEMENT NOTE 11/25/2012  Patient:  [name], [name]   Account Number:  [id number]  Date Initiated:  11/25/2012  Documentation initiated by:  Donn Pierini  Subjective/Objective Assessment:   Pt admitted with SOB- +PE and DVT     Action/Plan:   PTA pt lived at home was active with [facility] for [facility] services RN/PT/OT-   Anticipated DC Date:  11/27/2012   Anticipated DC Plan:  HOME W HOME HEALTH SERVICES      DC Planning Services  CM consult      [facility] Choice  Resumption Of Svcs/PTA Provider   Choice offered to / List presented to:  C-1 Patient           [facility] agency  [facility]   Status of service:  In process, will continue to follow Medicare Important Message given?   (If response is "NO", the following Medicare IM given date fields will be blank) Date Medicare IM given:   Date Additional Medicare IM given:    Discharge Disposition:    Per UR Regulation:  Reviewed for med. necessity/level of care/duration of stay  If discussed at Long Length of Stay Meetings, dates discussed:    Comments:  11/25/12- 1430- Donn Pierini RN, BSN [phone] Pt has been started on Xarelto- benefits check completed- will require a Prior Auth- xarelto is non formulary- number to call for prior auth (ph [phone])- sticky note left for MD and MD notified.  Spoke with pt, daughter and granddaughter at bedside- per conversation pt had recently returned home from short term stay at [facility] for rehab. She had been set up with [facility] for HH-RN/PT/OT- pt does want to continue these services at discharge. She reports she has needed DME- including RW, elevated commode seats, and cane at home. She will also need home 02 at discharge- and is agreeable to this. NCM to continue to follow for d/c planning and needs. Pt will need resumption orders for [facility] prior to discharge

## 2012-11-25 NOTE — Progress Notes (Signed)
[facility] PROGRESS NOTE  [name] GEX:[id number] DOB: 06/06/1934 DOA: 11/23/2012 PCP: [name], MD  Assessment/Plan: 1. Bilateral PE/ acute DVT in the  Left popliteal vein  : started on IV heparin after diagnosis as she was reluctant to start on oral anticoagulants, as she reported that her husband passed away from internal bleeding from warfarin. After risk / benefits explained patient started on po xarelto on 11/24/12.  Chest discomfort present on admission has resolved . Echocardiogram revealing elevated pulm artery pressure - monitor closely with ambulation .Continue with oxygen supplementation  2. Strep agalactia UTI : Received iv levaquin for 2 days  , changed to po cipro 11/25/12 3. S/p recent TKR - PT eval done on 11/24/12 - patient can go home with PT  4. Hypokalemia - repleted  5. PSVT - patient with history of svt - probable worsened now by pulm HTN and PE - received iv BB x2 on 11/24/12 and converted to NSR on 11/25/12 AM - then back into SVT 11/25/12 at 7 AM - converted back into sinus rhythm and then she reverted back to SVT at 15 p.m. on January 30. Atenolol increased to 25 mg by mouth twice a day on January 30  Code Status: full code Family Communication: patient Disposition Plan: home       HPI/Subjective: Palpitations when tachycardic, no chest pain, no dyspnea Objective: Filed Vitals:   11/25/12 0709 11/25/12 1330 11/25/12 1550 11/25/12 1555  BP:  137/86 113/81   Pulse: 140 64  138  Temp:  98.7 F (37.1 C)    TempSrc:      Resp:  17    Height:      Weight:      SpO2:  94%     Patient Vitals for the past 24 hrs:  BP Temp Temp src Pulse Resp SpO2 Weight  11/25/12 1555 - - - 138  - - -  11/25/12 1550 113/81 mmHg - - - - - -  11/25/12 1330 137/86 mmHg 98.7 F (37.1 C) - 64  17  94 % -  11/25/12 0709 - - - 140  - - -  11/25/12 0534 118/75 mmHg 98.3 F (36.8 C) Oral 65  15  94 % 92.851 kg (204 lb 11.2 oz)  11/25/12 0030 - - - 67  - - -  11/24/12 2300  - - - 136  - - -  11/24/12 2100 - - - 135  - - -  11/24/12 2040 108/75 mmHg 98.2 F (36.8 C) Oral 137  16  96 % -  11/24/12 1900 104/72 mmHg - - 139  - - -  11/24/12 1755 94/63 mmHg - - 138  - - -     Intake/Output Summary (Last 24 hours) at 11/25/12 1626 Last data filed at 11/25/12 1200  Gross per 24 hour  Intake   1223 ml  Output      0 ml  Net   1223 ml   Filed Weights   11/23/12 0449 11/24/12 0539 11/25/12 0534  Weight: 92.579 kg (204 lb 1.6 oz) 93.4 kg (205 lb 14.6 oz) 92.851 kg (204 lb 11.2 oz)    Exam:   General:  Alert afebrile comfortable  Cardiovascular: nl s1s2, tachy  RRR  Respiratory: CTAB, no wheezing or rhonchi  Abdomen: soft NT ND BS+    Data Reviewed:  2d echo  Study Conclusions  - Left ventricle: The cavity size was normal. Wall thickness was normal. Systolic function was normal.  The estimated ejection fraction was in the range of 55% to 60%. Wall motion was normal; there were no regional wall motion abnormalities. Doppler parameters are consistent with abnormal left ventricular relaxation (grade 1 diastolic dysfunction). - Ventricular septum: The contour showed diastolic and systolic flattening consistent with presure/volume overload. - Left atrium: The atrium was mildly dilated. - Right ventricle: The cavity size was moderately dilated. Systolic function was mildly reduced. Systolic pressure was increased. - Right atrium: The atrium was moderately to severely dilated. - Atrial septum: No defect or patent foramen ovale was identified. - Tricuspid valve: Severe regurgitation. - Pulmonary arteries: PA peak pressure: 82mm Hg (S). Impressions:  - The right ventricular systolic pressure was increased consistent with severe pulmonary hypertension.     Basic Metabolic Panel:  Lab 11/25/12 6962 11/23/12 0607 11/23/12 0154 11/22/12 1421 11/19/12 1626  NA 139 141 139 135 134*  K 3.7 4.2 3.5 3.4* 3.2*  CL 103 103 100 95* 93*  CO2 27 30  -- 32 35*  GLUCOSE 129* 130* 104* 123* 112*  BUN 12 15 15 17  28*  CREATININE 0.73 0.76 0.90 0.85 0.79  CALCIUM 8.0* 8.5 -- 8.5 9.0  MG 1.8 -- -- -- --  PHOS -- -- -- -- --   Liver Function Tests:  Lab 11/23/12 0607 11/22/12 1421 11/19/12 1626  AST 16 16 16   ALT 11 10 11   ALKPHOS 56 55 62  BILITOT 0.5 0.7 0.6  PROT 6.2 6.2 6.3  ALBUMIN 2.6* 2.6* 3.6    Lab 11/23/12 0607  LIPASE 10*  AMYLASE --   No results found for this basename: AMMONIA:5 in the last 168 hours CBC:  Lab 11/25/12 0550 11/24/12 0535 11/23/12 0607 11/23/12 0154 11/23/12 0141 11/22/12 1421 11/19/12 1626  WBC 6.4 7.8 7.6 -- 8.0 8.8 --  NEUTROABS -- -- 5.4 -- -- -- 5.1  HGB 10.7* 11.5* 11.8* 11.2* 11.5* -- --  HCT 34.3* 36.4 37.1 33.0* 35.8* -- --  MCV 95.8 96.0 94.6 -- 94.0 94.4 --  PLT 211 207 225 -- 201 232 --   Cardiac Enzymes:  Lab 11/24/12 1929 11/23/12 1757 11/23/12 1210 11/23/12 0607 11/22/12 1422  CKTOTAL -- -- -- -- --  CKMB -- -- -- -- --  CKMBINDEX -- -- -- -- --  TROPONINI <0.30 <0.30 <0.30 <0.30 <0.30   BNP (last 3 results) No results found for this basename: PROBNP:3 in the last 8760 hours CBG: No results found for this basename: GLUCAP:5 in the last 168 hours  Recent Results (from the past 240 hour(s))  URINE CULTURE     Status: Normal   Collection Time   11/22/12  4:09 PM      Component Value Range Status Comment   Specimen Description URINE, RANDOM   Final    Special Requests Normal   Final    Culture  Setup Time 11/23/2012 00:16   Final    Colony Count 80,000 COLONIES/ML   Final    Culture     Final    Value: ESCHERICHIA COLI     ENTEROCOCCUS SPECIES   Report Status 11/25/2012 FINAL   Final    Organism ID, Bacteria ESCHERICHIA COLI   Final    Organism ID, Bacteria ENTEROCOCCUS SPECIES   Final   URINE CULTURE     Status: Normal   Collection Time   11/23/12  4:56 AM      Component Value Range Status Comment   Specimen Description URINE, CLEAN CATCH   Final  Special  Requests ADDED 0530   Final    Culture  Setup Time 11/23/2012 05:33   Final    Colony Count 45,000 COLONIES/ML   Final    Culture     Final    Value: GROUP B STREP(S.AGALACTIAE)ISOLATED     Note: TESTING AGAINST S. AGALACTIAE NOT ROUTINELY PERFORMED DUE TO PREDICTABILITY OF AMP/PEN/VAN SUSCEPTIBILITY.   Report Status 11/25/2012 FINAL   Final      Studies: No results found.  Scheduled Meds:    . atenolol  25 mg Oral Once  . atenolol  25 mg Oral BID  . calcium-vitamin D  1 tablet Oral Q breakfast  . feeding supplement  237 mL Oral BID BM  . pantoprazole  40 mg Oral Daily  . rivaroxaban  15 mg Oral BID  . senna-docusate  2 tablet Oral Daily  . sodium chloride  3 mL Intravenous Q12H   Continuous Infusions:    Principal Problem:  *PE (pulmonary embolism) Active Problems:  HYPERCHOLESTEROLEMIA, PURE  HYPOKALEMIA  HYPERTENSION, ESSENTIAL NOS  PSVT  DVT (deep venous thrombosis)  Pulmonary hypertension, moderate to severe        Vergil Burby  [facility] Pager [phone]. If 7PM-7AM, please contact night-coverage at [email or web address], password [facility] 11/25/2012, 4:26 PM  LOS: 2 days

## 2012-11-25 NOTE — Progress Notes (Signed)
New orders received. Will continue to monitor.

## 2012-11-26 ENCOUNTER — Encounter (HOSPITAL_COMMUNITY): Payer: Self-pay | Admitting: Physician Assistant

## 2012-11-26 LAB — CBC
HCT: 38.2 % (ref 36.0–46.0)
Hemoglobin: 12.2 g/dL (ref 12.0–15.0)
MCHC: 31.9 g/dL (ref 30.0–36.0)
RBC: 4.02 MIL/uL (ref 3.87–5.11)

## 2012-11-26 MED ORDER — METOPROLOL TARTRATE 50 MG PO TABS: 50.0000 mg | ORAL_TABLET | Freq: Two times a day (BID) | ORAL | Status: DC

## 2012-11-26 MED ORDER — RIVAROXABAN 15 MG PO TABS: 15.0000 mg | ORAL_TABLET | Freq: Two times a day (BID) | ORAL | Status: DC

## 2012-11-26 MED ORDER — METOPROLOL TARTRATE 1 MG/ML IV SOLN
2.5000 mg | Freq: Once | INTRAVENOUS | Status: AC
  Administered 2012-11-26: 2.5 mg via INTRAVENOUS

## 2012-11-26 MED ORDER — LORAZEPAM 0.5 MG PO TABS
0.5000 mg | ORAL_TABLET | Freq: Once | ORAL | Status: AC
  Administered 2012-11-26: 0.5 mg via ORAL
  Filled 2012-11-26: qty 1

## 2012-11-26 MED ORDER — METOPROLOL TARTRATE 50 MG PO TABS
50.0000 mg | ORAL_TABLET | Freq: Two times a day (BID) | ORAL | Status: DC
  Administered 2012-11-26 – 2012-11-27 (×3): 50 mg via ORAL
  Filled 2012-11-26 (×4): qty 1

## 2012-11-26 MED ORDER — METOPROLOL TARTRATE 1 MG/ML IV SOLN
2.5000 mg | Freq: Once | INTRAVENOUS | Status: AC
  Administered 2012-11-26: 2.5 mg via INTRAVENOUS
  Filled 2012-11-26: qty 5

## 2012-11-26 NOTE — Consult Note (Signed)
CARDIOLOGY CONSULT NOTE   Patient ID: [name] MRN: [id number] DOB/AGE: 1934/04/28 79 y.o.  Admit date: 11/23/2012  Primary Physician   [name], MD Primary Cardiologist   None Reason for Consultation   PSVT  HPI: [name] is a 79 year old female with past history of PSVT HTN, HL, and recent history of left TKR on 10/29/2012 who presented to the ED on 11/23/2012 with palpitations. She had seen her PCP 11/19/2012, who discontinued her atenolol and HCTZ due to bradycardia and some hypotension. On 11/22/2012 she went back to her PCP for increased HR and weakness. EMS was called and she was transferred to the ED with palpitations. The CXR showed possible pneumonia and she was given levoquin and discharged home. She felt the palpitations again when she got home and began to feel weak. EMS was called on 1/28 and she returned to the ED. She was in SVT on EMS and on arrival to the ED, but right before Cardizem was going to start she converted back to sinus rhythm. D-dimer was elevated and chest CT showed bilateral PE and an acute DVT was found in her left popliteal vein. She was started on IV heparin. Echo showed pulmonary artey peak pressure of 82 mm Hg and severe pulmonary HTN. She was started on Xarelto on 11/24/2012 for anticoagulation. She developed some hypokalemia, but that was resolved. She had an episode of SVT on 1/29 and received IV BB x 2. On 11/25/2012 she converted back to NSR early in the morning. At 7 am she converted to SVT and then back to NSR. At 3 pm she converted to SVT. Atenolol was increased to 25 mg PO BID. She was given one dose of metoprolol 5 mg IV. Around 4 pm she went back to NSR and stayed in it in the evening.  This morning she is in SVT with HR in the 120s-130s, which started early this morning. She received Ativan 0.5 mg and 2 doses of metoprolol IV 2.5 mg, but did not convert back to NSR. She felt some palpitations early this morning, but denies feeling them currently.  She feels a little SOB, but states it is the same since she has been in the hospital, and does not feel more SOB today. She denies any chest pain, dizziness, feeling lightheaded, diaphoresis, and nausea.  Of note, because of the nature of her knee problem and this is the 3rd surgery (TKR, revision and now another TKR), she has been in a full knee immobilizer since the surgery and is supposed to keep it on for 6 weeks.   Past Medical History  Diagnosis Date  . Osteopenia   . Hyperlipidemia   . Hypertension   . PSVT (paroxysmal supraventricular tachycardia)   . Spinal stenosis   . Overactive bladder     urge incontinence     Past Surgical History  Procedure Date  . Cataract extraction 2008  . Bladder suspension 1993  . Joint replacement 2000    left TKR  . Total knee revision 01/12/2012    Procedure: TOTAL KNEE REVISION;  Surgeon: [name], MD;  Location: [facility] OR;  Service: Orthopedics;  Laterality: Left;  DEPUY/ LCS , HAND SET  . Trigger finger release 01/12/2012    Procedure: RELEASE TRIGGER FINGER/A-1 PULLEY;  Surgeon: [name], MD;  Location: [facility] OR;  Service: Orthopedics;  Laterality: Right;  . Cholecystectomy   . Hernia repair   . Total knee arthroplasty 10/29/2012  at [facility]    Allergies  Allergen Reactions  . Penicillins     REACTION: hives  . Sulfonamide Derivatives Hives    I have reviewed the patient's current medications    . atenolol  25 mg Oral Once  . atenolol  25 mg Oral BID  . calcium-vitamin D  1 tablet Oral Q breakfast  . feeding supplement  237 mL Oral BID BM  . pantoprazole  40 mg Oral Daily  . rivaroxaban  15 mg Oral BID  . senna-docusate  2 tablet Oral Daily  . sodium chloride  3 mL Intravenous Q12H     acetaminophen, acetaminophen, ondansetron (ZOFRAN) IV, ondansetron, polyethylene glycol  Prior to Admission medications   Medication Sig Start Date End Date Taking? Authorizing Provider  atenolol (TENORMIN) 25 MG tablet Take 25 mg by  mouth daily.   Yes Historical Provider, MD  Calcium Carbonate-Vitamin D (CALCIUM-VITAMIN D) 600-200 MG-UNIT CAPS Take 1 capsule by mouth 3 (three) times daily.    Yes Historical Provider, MD  cholecalciferol (VITAMIN D) 1000 UNITS tablet Take 4,000 Units by mouth daily.   Yes Historical Provider, MD  Multiple Vitamins-Iron (MULTIVITAMIN/IRON PO) Take 1 tablet by mouth daily.    Yes Historical Provider, MD  polyethylene glycol (MIRALAX / GLYCOLAX) packet Take 17 g by mouth daily as needed. For constipation   Yes Historical Provider, MD  sennosides-docusate sodium (SENOKOT-S) 8.6-50 MG tablet Take 2 tablets by mouth daily.    Yes Historical Provider, MD     History   Social History  . Marital Status: Married    Spouse Name: N/A    Number of Children: N/A  . Years of Education: N/A   Occupational History  . Retired    Social History Main Topics  . Smoking status: Never Smoker   . Smokeless tobacco: Not on file  . Alcohol Use: No  . Drug Use: No  . Sexually Active: No   Other Topics Concern  . Not on file   Social History Narrative   Pt lives alone but dtr lives on property and has been staying with her since surgery.     Family History  Problem Relation Age of Onset  . Cancer Mother     breast CA  . Heart disease Father     MI  . Diabetes Father   . Hypertension Father   . Cancer Sister     uterine CA     ROS:  Full 14 point review of systems complete and found to be negative unless listed above.  Physical Exam: Blood pressure 112/74, pulse 65, temperature 98.4 F (36.9 C), temperature source Oral, resp. rate 22, height 5\' 6"  (1.676 m), weight 204 lb 11.2 oz (92.851 kg), SpO2 97.00%.  General: Well developed, well nourished, elderly female in no acute distress Head: Eyes PERRLA, No xanthomas.   Normocephalic and atraumatic, oropharynx without edema or exudate. Dentition good. Lungs: Respiration with good effort. Rales bases.  Heart: Tachycardic. Regular rhythm with  S1, S2 present, no murmur. Pulses are 2+ radial, right PT, left femoral.   Neck: No carotid bruits. No lymphadenopathy. No JVD. Abdomen: Bowel sounds present, abdomen soft and non-tender without masses or hernias noted. Msk:  No spine or cva tenderness. No weakness, no joint deformities or effusions. Extremities: No clubbing or cyanosis. No edema - TED hose on both lower extremities. Large hinge brace on left lower extremity. Neuro: Alert and oriented X 3. No focal deficits noted. Psych:  Good affect, responds  appropriately Skin: No rashes or lesions noted.  Labs:   Lab Results  Component Value Date   WBC 7.6 11/26/2012   HGB 12.2 11/26/2012   HCT 38.2 11/26/2012   MCV 95.0 11/26/2012   PLT 238 11/26/2012     Lab 11/25/12 0550 11/23/12 0607  NA 139 --  K 3.7 --  CL 103 --  CO2 27 --  BUN 12 --  CREATININE 0.73 --  CALCIUM 8.0* --  PROT -- 6.2  BILITOT -- 0.5  ALKPHOS -- 56  ALT -- 11  AST -- 16  GLUCOSE 129* --   Magnesium  Date Value Range Status  11/25/2012 1.8  1.5 - 2.5 mg/dL Final    Basename 29/56/21 1929 11/23/12 1757 11/23/12 1210  CKTOTAL -- -- --  CKMB -- -- --  TROPONINI <0.30 <0.30 <0.30   Lab Results  Component Value Date   DDIMER 5.74* 11/23/2012   Lipase  Date/Time Value Range Status  11/23/2012  6:07 AM 10* 11 - 59 U/L Final   TSH  Date/Time Value Range Status  11/23/2012  6:07 AM 4.422  0.350 - 4.500 uIU/mL Final     T3, Free  Date/Time Value Range Status  11/23/2012  6:07 AM 2.7  2.3 - 4.2 pg/mL Final   Echo:  11/23/2012 Study Conclusions - Left ventricle: The cavity size was normal. Wall thickness was normal. Systolic function was normal. The estimated ejection fraction was in the range of 55% to 60%. Wall motion was normal; there were no regional wall motion abnormalities. Doppler parameters are consistent with abnormal left ventricular relaxation (grade 1 diastolic dysfunction). - Ventricular septum: The contour showed diastolic  and systolic flattening consistent with presure/volume overload. - Left atrium: The atrium was mildly dilated. - Right ventricle: The cavity size was moderately dilated. Systolic function was mildly reduced. Systolic pressure was increased. - Right atrium: The atrium was moderately to severely dilated. - Atrial septum: No defect or patent foramen ovale was identified. - Tricuspid valve: Severe regurgitation. - Pulmonary arteries: PA peak pressure: 82mm Hg (S). Impressions: - The right ventricular systolic pressure was increased consistent with severe pulmonary hypertension.  ECG:   24-Nov-2012 17:50:41  Supraventricular tachycardia with occasional Premature ventricular complexes and Fusion complexes Left axis deviation Consider Inferior infarct , age undetermined Anterolateral infarct , age undetermined Abnormal ECG 23mm/s 26mm/mV 100Hz  8.0.1 12SL 239 CID: [id number] Referred by: A KAKRAKANDY Confirmed By: Julien Nordmann MD Vent. rate 138 BPM PR interval * ms QRS duration 82 ms QT/QTc 312/472 ms P-R-T axes * -34 237  Radiology:  Dg Chest 2 View 11/22/2012  *RADIOLOGY REPORT*  Clinical Data: Cough  CHEST - 2 VIEW  Comparison: 01/05/2012  Findings: Mild patchy opacity at the lateral right lung base, possibly reflecting a mild pneumonia.  Increased interstitial markings. No pleural effusion or pneumothorax.  Cardiomegaly.  Degenerative changes of the visualized thoracolumbar spine.  IMPRESSION: Possible mild pneumonia at the lateral right lung base.   Original Report Authenticated By: Charline Bills, M.D.    Ct Angio Chest Pe W/cm &/or Wo Cm 11/23/2012  *RADIOLOGY REPORT*  Clinical Data: Short of breath  CT ANGIOGRAPHY CHEST  Technique:  Multidetector CT imaging of the chest using the standard protocol during bolus administration of intravenous contrast. Multiplanar reconstructed images including MIPs were obtained and reviewed to evaluate the vascular anatomy.  Contrast: OMNIPAQUE  IOHEXOL 350 MG/ML SOLN  Comparison: None.  Findings: There is significant right middle and lower lobe lobe are pulmonary  thromboembolism extending into segmental branches.  There is also thrombus within segmental branches of the left lower lobe pulmonary artery.  Right atrium and ventricle are dilated.  There is contrast reflux into the hepatic veins compatible with right heart strain.  Bilateral pleural effusions right greater than left are associated with dependent atelectasis.  There is a lytic bone lesion in the right side of L1 with some central calcified matrix.  No cortical breakthrough.  There is prominent posterior osteophyte at T12-L1 causing an element of spinal stenosis. T12 wedge compression deformity has a chronic appearance.  Diffuse hepatic steatosis.  No abnormal mediastinal adenopathy.  IMPRESSION: The study is positive for bilateral pulmonary thromboembolism. There are findings to suggest right heart strain.  Indeterminate L1 lytic bone lesion.  Malignancy is not excluded. MRI or bone scan can be performed to further characterize.  This may simply represent an atypical angioma.  Bilateral pleural effusions.  Critical Value/emergent results were called by telephone at the time of interpretation on 11/23/2012 at 1320 hours to Dr.Akula, who verbally acknowledged these results.   Original Report Authenticated By: Jolaine Click, M.D.    Dg Chest Portable 1 View 11/23/2012  *RADIOLOGY REPORT*  Clinical Data: 79 year old female with tachycardia.  PORTABLE CHEST - 1 VIEW  Comparison: 11/22/2012 and prior chest radiographs  Findings: Cardiomegaly is again noted.  There is no evidence of focal airspace disease, pulmonary edema, suspicious pulmonary nodule/mass, pleural effusion, or pneumothorax. No acute bony abnormalities are identified.  IMPRESSION: Cardiomegaly without evidence of acute cardiopulmonary disease.   Original Report Authenticated By: Harmon Pier, M.D.     ASSESSMENT AND PLAN:     PSVT -  in the 120s-130s now. Just switched back to NSR HR 60s as of about 10 am. Occasional PVCs. On atenolol 25 mg BID and given metoprolol 2.5 mg IV x 2 early this am. Continue on telemetry. MD advise continue atenolol therapy vs. Consider another agent.  Principal Problem:  *PE (pulmonary embolism) - anticoagulation with Xarelto. Continue therapy per IM.  Active Problems:  HYPERCHOLESTEROLEMIA, PURE - not on any therapy. LDL 118 in 2012. Obtain lipid panel.   HYPOKALEMIA - resolved. K is 3.7 as of yesterday. PTA was on HCTZ, d/c'd. Recheck in am.   HYPERTENSION, ESSENTIAL NOS - HCTZ recently discontinued by PCP. BP is good today.    DVT (deep venous thrombosis) - on Xarelto. Continue treatment per IM.   Pulmonary hypertension, moderate to severe - due to PE. See treatment above.   Signed: Caroline More PA-S2 11/26/2012, 11:30 AM Co-Sign MD  Seen and agree with changes made above. Theodore Demark, PA 11/26/2012 11:30 AM The history was reviewed and the patient examined.  Agree with the assessment and plan as noted above.  Review of her rhythm strips shows repetitive episodes of supraventricular tachycardia suggesting AV nodal reentrant tachycardia.  She continued to have runs of AV node reentrant tachycardia last night after having been on her home dose of atenolol 25 mg twice a day.  In order to prevent further episodes we will change to metoprolol tartrate 50 mg twice a day.  Her two-dimensional echocardiographic findings were reviewed and show normal left ventricular systolic function but with marked pulmonary hypertension felt to be secondary to her multiple pulmonary emboli.  She does not give any history of having been on anticoagulants post left knee replacement surgery.  She has established DVT of the left popliteal vein and she has multiple pulmonary emboli to both lower lobes.  She will  need to be on long-term anticoagulation for about 6 months then switch to aspirin.  She is presently  immobilized after her right knee surgery with a long-leg brace which she will be wearing until she sees her orthopedist Dr. Margie Billet at [facility] in mid February.  The patient states that the plan was that she would be a knee brace for 6 weeks postoperatively.  Will follow with you

## 2012-11-26 NOTE — Progress Notes (Signed)
[facility] PROGRESS NOTE  [name] GNF:[id number] DOB: 1934-09-21 DOA: 11/23/2012 PCP: [name], MD  Assessment/Plan: 1. Bilateral PE/ acute DVT in the  Left popliteal vein  : started on IV heparin after diagnosis as she was reluctant to start on oral anticoagulants, as she reported that her husband passed away from internal bleeding from warfarin. After risk / benefits explained patient started on po xarelto on 11/24/12.  Chest discomfort present on admission has resolved . Echocardiogram revealing elevated pulm artery pressure - monitor closely with ambulation .Continue with oxygen supplementation  2. Strep agalactia UTI, Enterococus, E coli  : Received iv levaquin for 3 days   3. S/p recent TKR - PT eval done on 11/24/12 - patient can go home with PT  4. Hypokalemia - repleted  5. AV nodal re-entrant tachycardia  - patient with history of palpitations/tachycardia - probable worsened now by pulm HTN and PE - received iv BB x2 on 11/24/12 and converted to NSR on 11/25/12 AM - then back into SVT 11/25/12 at 7 AM - converted back into sinus rhythm and then she reverted back to SVT at 15 p.m. on January 30. Atenolol increased to 25 mg by mouth twice a day on January 30. Patient seen by Cardiology on 11/26/2012 and she was changed to Metoprolol at 50 mg po BID.   Code Status: full code Family Communication: patient Disposition Plan: home    HPI/Subjective: No chest pain, no further palpitations   Objective: Filed Vitals:   11/26/12 0340 11/26/12 0411 11/26/12 0445 11/26/12 0553  BP: 115/86 117/84 134/101 113/74  Pulse: 136 128 133 131  Temp:   98.4 F (36.9 C)   TempSrc:   Oral   Resp:      Height:      Weight:      SpO2:   97%    Patient Vitals for the past 24 hrs:  BP Temp Temp src Pulse Resp SpO2  11/26/12 0553 113/74 mmHg - - 131  - -  11/26/12 0445 134/101 mmHg 98.4 F (36.9 C) Oral 133  - 97 %  11/26/12 0411 117/84 mmHg - - 128  - -  11/26/12 0340 115/86 mmHg - - 136   - -  11/25/12 2128 126/76 mmHg 98.9 F (37.2 C) Oral 63  18  93 %  11/25/12 2017 132/74 mmHg - - 67  - -  11/25/12 1628 - - - 60  - -  11/25/12 1555 - - - 138  - -  11/25/12 1550 113/81 mmHg - - - - -  11/25/12 1330 137/86 mmHg 98.7 F (37.1 C) - 64  17  94 %     Intake/Output Summary (Last 24 hours) at 11/26/12 0731 Last data filed at 11/25/12 2129  Gross per 24 hour  Intake    723 ml  Output      0 ml  Net    723 ml   Filed Weights   11/23/12 0449 11/24/12 0539 11/25/12 0534  Weight: 92.579 kg (204 lb 1.6 oz) 93.4 kg (205 lb 14.6 oz) 92.851 kg (204 lb 11.2 oz)    Exam:   General:  Alert afebrile comfortable  Cardiovascular: nl s1s2, tachy  RRR  Respiratory: CTAB, no wheezing or rhonchi  Abdomen: soft NT ND BS+    Data Reviewed:  2d echo  Study Conclusions  - Left ventricle: The cavity size was normal. Wall thickness was normal. Systolic function was normal. The estimated ejection fraction was in  the range of 55% to 60%. Wall motion was normal; there were no regional wall motion abnormalities. Doppler parameters are consistent with abnormal left ventricular relaxation (grade 1 diastolic dysfunction). - Ventricular septum: The contour showed diastolic and systolic flattening consistent with presure/volume overload. - Left atrium: The atrium was mildly dilated. - Right ventricle: The cavity size was moderately dilated. Systolic function was mildly reduced. Systolic pressure was increased. - Right atrium: The atrium was moderately to severely dilated. - Atrial septum: No defect or patent foramen ovale was identified. - Tricuspid valve: Severe regurgitation. - Pulmonary arteries: PA peak pressure: 82mm Hg (S). Impressions:  - The right ventricular systolic pressure was increased consistent with severe pulmonary hypertension.     Basic Metabolic Panel:  Lab 11/25/12 4098 11/23/12 0607 11/23/12 0154 11/22/12 1421 11/19/12 1626  NA 139 141 139 135  134*  K 3.7 4.2 3.5 3.4* 3.2*  CL 103 103 100 95* 93*  CO2 27 30 -- 32 35*  GLUCOSE 129* 130* 104* 123* 112*  BUN 12 15 15 17  28*  CREATININE 0.73 0.76 0.90 0.85 0.79  CALCIUM 8.0* 8.5 -- 8.5 9.0  MG 1.8 -- -- -- --  PHOS -- -- -- -- --   Liver Function Tests:  Lab 11/23/12 0607 11/22/12 1421 11/19/12 1626  AST 16 16 16   ALT 11 10 11   ALKPHOS 56 55 62  BILITOT 0.5 0.7 0.6  PROT 6.2 6.2 6.3  ALBUMIN 2.6* 2.6* 3.6    Lab 11/23/12 0607  LIPASE 10*  AMYLASE --   No results found for this basename: AMMONIA:5 in the last 168 hours CBC:  Lab 11/26/12 0620 11/25/12 0550 11/24/12 0535 11/23/12 0607 11/23/12 0154 11/23/12 0141 11/19/12 1626  WBC 7.6 6.4 7.8 7.6 -- 8.0 --  NEUTROABS -- -- -- 5.4 -- -- 5.1  HGB 12.2 10.7* 11.5* 11.8* 11.2* -- --  HCT 38.2 34.3* 36.4 37.1 33.0* -- --  MCV 95.0 95.8 96.0 94.6 -- 94.0 --  PLT 238 211 207 225 -- 201 --   Cardiac Enzymes:  Lab 11/24/12 1929 11/23/12 1757 11/23/12 1210 11/23/12 0607 11/22/12 1422  CKTOTAL -- -- -- -- --  CKMB -- -- -- -- --  CKMBINDEX -- -- -- -- --  TROPONINI <0.30 <0.30 <0.30 <0.30 <0.30   BNP (last 3 results) No results found for this basename: PROBNP:3 in the last 8760 hours CBG: No results found for this basename: GLUCAP:5 in the last 168 hours  Recent Results (from the past 240 hour(s))  URINE CULTURE     Status: Normal   Collection Time   11/22/12  4:09 PM      Component Value Range Status Comment   Specimen Description URINE, RANDOM   Final    Special Requests Normal   Final    Culture  Setup Time 11/23/2012 00:16   Final    Colony Count 80,000 COLONIES/ML   Final    Culture     Final    Value: ESCHERICHIA COLI     ENTEROCOCCUS SPECIES   Report Status 11/25/2012 FINAL   Final    Organism ID, Bacteria ESCHERICHIA COLI   Final    Organism ID, Bacteria ENTEROCOCCUS SPECIES   Final   URINE CULTURE     Status: Normal   Collection Time   11/23/12  4:56 AM      Component Value Range Status Comment    Specimen Description URINE, CLEAN CATCH   Final    Special Requests  ADDED 0530   Final    Culture  Setup Time 11/23/2012 05:33   Final    Colony Count 45,000 COLONIES/ML   Final    Culture     Final    Value: GROUP B STREP(S.AGALACTIAE)ISOLATED     Note: TESTING AGAINST S. AGALACTIAE NOT ROUTINELY PERFORMED DUE TO PREDICTABILITY OF AMP/PEN/VAN SUSCEPTIBILITY.   Report Status 11/25/2012 FINAL   Final      Studies: No results found.  Scheduled Meds:    . atenolol  25 mg Oral Once  . atenolol  25 mg Oral BID  . calcium-vitamin D  1 tablet Oral Q breakfast  . feeding supplement  237 mL Oral BID BM  . pantoprazole  40 mg Oral Daily  . rivaroxaban  15 mg Oral BID  . senna-docusate  2 tablet Oral Daily  . sodium chloride  3 mL Intravenous Q12H   Continuous Infusions:    Principal Problem:  *PE (pulmonary embolism) Active Problems:  HYPERCHOLESTEROLEMIA, PURE  HYPOKALEMIA  HYPERTENSION, ESSENTIAL NOS  PSVT  DVT (deep venous thrombosis)  Pulmonary hypertension, moderate to severe        Tarrie Mcmichen  [facility] Pager [phone]. If 7PM-7AM, please contact night-coverage at [email or web address], password [facility] 11/26/2012, 7:31 AM  LOS: 3 days

## 2012-11-26 NOTE — Progress Notes (Signed)
Pt HR130's. Pt asymptomatic however states "i cant sleep". Md notified new orders received. Will continue to monitor.

## 2012-11-27 LAB — CBC
HCT: 37.2 % (ref 36.0–46.0)
MCH: 30.1 pg (ref 26.0–34.0)
MCV: 95.6 fL (ref 78.0–100.0)
RBC: 3.89 MIL/uL (ref 3.87–5.11)
WBC: 8.3 10*3/uL (ref 4.0–10.5)

## 2012-11-27 NOTE — Progress Notes (Signed)
SUBJECTIVE: The patient is doing well today.  At this time, she denies chest pain, shortness of breath, or any new concerns.     . calcium-vitamin D  1 tablet Oral Q breakfast  . feeding supplement  237 mL Oral BID BM  . metoprolol tartrate  50 mg Oral BID  . pantoprazole  40 mg Oral Daily  . rivaroxaban  15 mg Oral BID  . senna-docusate  2 tablet Oral Daily  . sodium chloride  3 mL Intravenous Q12H      OBJECTIVE: Physical Exam: Filed Vitals:   11/26/12 1500 11/26/12 1530 11/26/12 2115 11/27/12 0516  BP: 118/75  119/75 126/78  Pulse: 61  63 64  Temp: 97.9 F (36.6 C)  98.9 F (37.2 C) 99.4 F (37.4 C)  TempSrc: Oral  Oral Oral  Resp: 18  18 18   Height:      Weight:    207 lb 3.2 oz (93.985 kg)  SpO2: 92% 88% 95% 92%    Intake/Output Summary (Last 24 hours) at 11/27/12 0901 Last data filed at 11/26/12 1530  Gross per 24 hour  Intake    660 ml  Output      0 ml  Net    660 ml    Telemetry reveals sinus rhythm  GEN- The patient is well appearing, alert and oriented x 3 today.   Head- normocephalic, atraumatic Eyes-  Sclera clear, conjunctiva pink Ears- hearing intact Oropharynx- clear Neck- supple, no JVP Lymph- no cervical lymphadenopathy Lungs- Clear to ausculation bilaterally, normal work of breathing Heart- Regular rate and rhythm, no murmurs, rubs or gallops, PMI not laterally displaced GI- soft, NT, ND, + BS Extremities- no clubbing, cyanosis, or edema  LABS: Basic Metabolic Panel:  Basename 11/25/12 0550  NA 139  K 3.7  CL 103  CO2 27  GLUCOSE 129*  BUN 12  CREATININE 0.73  CALCIUM 8.0*  MG 1.8  PHOS --   Liver Function Tests: No results found for this basename: AST:2,ALT:2,ALKPHOS:2,BILITOT:2,PROT:2,ALBUMIN:2 in the last 72 hours No results found for this basename: LIPASE:2,AMYLASE:2 in the last 72 hours CBC:  Basename 11/27/12 0530 11/26/12 0620  WBC 8.3 7.6  NEUTROABS -- --  HGB 11.7* 12.2  HCT 37.2 38.2  MCV 95.6 95.0  PLT  267 238   Cardiac Enzymes:  Basename 11/24/12 1929  CKTOTAL --  CKMB --  CKMBINDEX --  TROPONINI <0.30   BNP: No components found with this basename: POCBNP:3 D-Dimer: No results found for this basename: DDIMER:2 in the last 72 hours Hemoglobin A1C: No results found for this basename: HGBA1C in the last 72 hours Fasting Lipid Panel: No results found for this basename: CHOL,HDL,LDLCALC,TRIG,CHOLHDL,LDLDIRECT in the last 72 hours Thyroid Function Tests: No results found for this basename: TSH,T4TOTAL,FREET3,T3FREE,THYROIDAB in the last 72 hours Anemia Panel: No results found for this basename: VITAMINB12,FOLATE,FERRITIN,TIBC,IRON,RETICCTPCT in the last 72 hours  RADIOLOGY: Dg Chest 2 View  11/22/2012  *RADIOLOGY REPORT*  Clinical Data: Cough  CHEST - 2 VIEW  Comparison: 01/05/2012  Findings: Mild patchy opacity at the lateral right lung base, possibly reflecting a mild pneumonia.  Increased interstitial markings. No pleural effusion or pneumothorax.  Cardiomegaly.  Degenerative changes of the visualized thoracolumbar spine.  IMPRESSION: Possible mild pneumonia at the lateral right lung base.   Original Report Authenticated By: Charline Bills, M.D.    Ct Angio Chest Pe W/cm &/or Wo Cm  11/23/2012  *RADIOLOGY REPORT*  Clinical Data: Short of breath  CT ANGIOGRAPHY CHEST  Technique:  Multidetector CT imaging of the chest using the standard protocol during bolus administration of intravenous contrast. Multiplanar reconstructed images including MIPs were obtained and reviewed to evaluate the vascular anatomy.  Contrast: OMNIPAQUE IOHEXOL 350 MG/ML SOLN  Comparison: None.  Findings: There is significant right middle and lower lobe lobe are pulmonary thromboembolism extending into segmental branches.  There is also thrombus within segmental branches of the left lower lobe pulmonary artery.  Right atrium and ventricle are dilated.  There is contrast reflux into the hepatic veins compatible  with right heart strain.  Bilateral pleural effusions right greater than left are associated with dependent atelectasis.  There is a lytic bone lesion in the right side of L1 with some central calcified matrix.  No cortical breakthrough.  There is prominent posterior osteophyte at T12-L1 causing an element of spinal stenosis. T12 wedge compression deformity has a chronic appearance.  Diffuse hepatic steatosis.  No abnormal mediastinal adenopathy.  IMPRESSION: The study is positive for bilateral pulmonary thromboembolism. There are findings to suggest right heart strain.  Indeterminate L1 lytic bone lesion.  Malignancy is not excluded. MRI or bone scan can be performed to further characterize.  This may simply represent an atypical angioma.  Bilateral pleural effusions.  Critical Value/emergent results were called by telephone at the time of interpretation on 11/23/2012 at 1320 hours to Dr.Akula, who verbally acknowledged these results.   Original Report Authenticated By: Jolaine Click, M.D.    Dg Chest Portable 1 View  11/23/2012  *RADIOLOGY REPORT*  Clinical Data: 79 year old female with tachycardia.  PORTABLE CHEST - 1 VIEW  Comparison: 11/22/2012 and prior chest radiographs  Findings: Cardiomegaly is again noted.  There is no evidence of focal airspace disease, pulmonary edema, suspicious pulmonary nodule/mass, pleural effusion, or pneumothorax. No acute bony abnormalities are identified.  IMPRESSION: Cardiomegaly without evidence of acute cardiopulmonary disease.   Original Report Authenticated By: Harmon Pier, M.D.     ASSESSMENT AND PLAN:  Principal Problem:  *PE (pulmonary embolism) Active Problems:  HYPERCHOLESTEROLEMIA, PURE  HYPOKALEMIA  HYPERTENSION, ESSENTIAL NOS  PSVT  DVT (deep venous thrombosis)  Pulmonary hypertension, moderate to severe  1. SVT Short RP during this hospitalization in the setting of recent surgery and PTE. Presently improved Continue current regimen She may benefit  from ablation at some point I will see her in 8 weeks for further EP evaluation  2. PTE Continue xarelto  Plans for discharge are noted I will see as needed while here.    Hillis Range, MD 11/27/2012 9:01 AM

## 2012-11-27 NOTE — Discharge Summary (Signed)
Physician Discharge Summary  [name]:[id number] DOB: 10/23/1934 DOA: 11/23/2012  PCP: [name], MD  Admit date: 11/23/2012 Discharge date: 11/27/2012  Time spent: 45 minutes   Discharge Diagnoses:  Acute DVT and PE   HYPERCHOLESTEROLEMIA, PURE  HYPOKALEMIA  HYPERTENSION, ESSENTIAL NOS  AV nod re - entrant tachycardia   Pulmonary hypertension, moderate to severe Acute hypoxic respiratory failure Recent left TKR done at [facility] in [place]    Discharge Condition: good  Diet recommendation: regular   Filed Weights   11/24/12 0539 11/25/12 0534 11/27/12 0516  Weight: 93.4 kg (205 lb 14.6 oz) 92.851 kg (204 lb 11.2 oz) 93.985 kg (207 lb 3.2 oz)    History of present illness:  79 year-old female with history of paroxysmal supraventricular tachycardia, hypertension who has had a recent left knee surgery and was in rehabilitation and come for her followup with her PCP last week. At that time patient's Tenormin and HCTZ were discontinued. Following which patient started developing palpitations over the weekend. She had come to the ER yesterday morning with palpitations. In the ER chest x-ray was showing possibility of pneumonia and patient was discharged home on oral Levaquin. After going back to home she started developing palpitations again and felt weak. EMS was called and rhythm strips were showing tachycardia concerning for SVT. In the ER patient was about to be started on Cardizem infusion when rhythm reverted back to sinus. Patient states that she still continues to have some productive cough but denies any fever chills. She occasionally has chest discomfort but denies any now. Patient has been on med for further observation. CT angiogram of the chest was obtained and showed pulmonary emboli.   Hospital Course:  1. Bilateral PE/ acute DVT in the Left popliteal vein : started on IV heparin after diagnosis as she was reluctant to start on oral anticoagulants, as she reported  that her husband passed away from internal bleeding from warfarin. After risk / benefits explained patient started on po xarelto on 11/24/12. Chest discomfort present on admission has resolved . Echocardiogram revealing elevated pulm artery pressure - . Patient improved steadily but she remains with hypoxic respiratory failure at the time of discharge requiring 2 L of oxygen at all times. We expect oxygen requirement to disappear over the course of the next month. Patient should have oxygen saturation tested with ambulation at a hospital followup visit 2. Strep agalactia UTI, Enterococus, E coli : Received iv levaquin for 3 days  3. S/p recent TKR - PT eval done on 11/24/12 - patient can go home with PT  4. Hypokalemia - repleted  5. AV nodal re-entrant tachycardia - patient with history of palpitations/tachycardia - probable worsened now by pulm HTN and PE - received iv BB x2 on 11/24/12 and converted to NSR on 11/25/12 AM - then back into SVT 11/25/12 at 7 AM - converted back into sinus rhythm and then she reverted back to SVT at 15 p.m. on January 30. Atenolol increased to 25 mg by mouth twice a day on January 30. Patient seen by Cardiology on 11/26/2012 and she was changed to Metoprolol at 50 mg po BID and we were able to control the symptoms. The patient will followup with Dr. Johney Frame from [facility] cardiology 2 months after discharge     Procedures: 2 D echo  2d echo  Study Conclusions  - Left ventricle: The cavity size was normal. Wall thickness was normal. Systolic function was normal. The estimated ejection fraction was in  the range of 55% to 60%. Wall motion was normal; there were no regional wall motion abnormalities. Doppler parameters are consistent with abnormal left ventricular relaxation (grade 1 diastolic dysfunction). - Ventricular septum: The contour showed diastolic and systolic flattening consistent with presure/volume overload. - Left atrium: The atrium was mildly dilated. -  Right ventricle: The cavity size was moderately dilated. Systolic function was mildly reduced. Systolic pressure was increased. - Right atrium: The atrium was moderately to severely dilated. - Atrial septum: No defect or patent foramen ovale was identified. - Tricuspid valve: Severe regurgitation. - Pulmonary arteries: PA peak pressure: 82mm Hg (S). Impressions:  - The right ventricular systolic pressure was increased consistent with severe pulmonary hypertension.     Consultations:  [place] cards   Discharge Exam: Filed Vitals:   11/26/12 1500 11/26/12 1530 11/26/12 2115 11/27/12 0516  BP: 118/75  119/75 126/78  Pulse: 61  63 64  Temp: 97.9 F (36.6 C)  98.9 F (37.2 C) 99.4 F (37.4 C)  TempSrc: Oral  Oral Oral  Resp: 18  18 18   Height:      Weight:    93.985 kg (207 lb 3.2 oz)  SpO2: 92% 88% 95% 92%    General: axox3 Cardiovascular: rrr, no mr,r,g  Respiratory: ctab   Discharge Instructions  Discharge Orders    Future Appointments: Provider: Department: Dept Phone: Center:   12/07/2012 11:30 AM Judy Pimple, MD [facility] [place] [phone] [id number]   02/01/2013 7:40 AM [facility] [place] [phone] [id number]   02/08/2013 10:30 AM Judy Pimple, MD [facility] [place] [phone] [id number]     Future Orders Please Complete By Expires   Diet - low sodium heart healthy      Increase activity slowly          Medication List     As of 11/27/2012  8:39 AM    STOP taking these medications         atenolol 25 MG tablet   Commonly known as: TENORMIN      TAKE these medications         Calcium-Vitamin D 600-200 MG-UNIT Caps   Take 1 capsule by mouth 3 (three) times daily.      cholecalciferol 1000 UNITS tablet   Commonly known as: VITAMIN D   Take 4,000 Units by mouth daily.      metoprolol 50 MG tablet   Commonly known as: LOPRESSOR   Take 1 tablet (50 mg total) by mouth 2  (two) times daily.      MULTIVITAMIN/IRON PO   Take 1 tablet by mouth daily.      polyethylene glycol packet   Commonly known as: MIRALAX / GLYCOLAX   Take 17 g by mouth daily as needed. For constipation      Rivaroxaban 15 MG Tabs tablet   Commonly known as: XARELTO   Take 1 tablet (15 mg total) by mouth 2 (two) times daily.      sennosides-docusate sodium 8.6-50 MG tablet   Commonly known as: SENOKOT-S   Take 2 tablets by mouth daily.           Follow-up Information    Schedule an appointment as soon as possible for a visit with [name], MD.   Contact information:   [address] [place] [place] [address] [phone]           The results of significant diagnostics  from this hospitalization (including imaging, microbiology, ancillary and laboratory) are listed below for reference.    Significant Diagnostic Studies: Dg Chest 2 View  11/22/2012  *RADIOLOGY REPORT*  Clinical Data: Cough  CHEST - 2 VIEW  Comparison: 01/05/2012  Findings: Mild patchy opacity at the lateral right lung base, possibly reflecting a mild pneumonia.  Increased interstitial markings. No pleural effusion or pneumothorax.  Cardiomegaly.  Degenerative changes of the visualized thoracolumbar spine.  IMPRESSION: Possible mild pneumonia at the lateral right lung base.   Original Report Authenticated By: Charline Bills, M.D.    Ct Angio Chest Pe W/cm &/or Wo Cm  11/23/2012  *RADIOLOGY REPORT*  Clinical Data: Short of breath  CT ANGIOGRAPHY CHEST  Technique:  Multidetector CT imaging of the chest using the standard protocol during bolus administration of intravenous contrast. Multiplanar reconstructed images including MIPs were obtained and reviewed to evaluate the vascular anatomy.  Contrast: OMNIPAQUE IOHEXOL 350 MG/ML SOLN  Comparison: None.  Findings: There is significant right middle and lower lobe lobe are pulmonary thromboembolism extending into segmental  branches.  There is also thrombus within segmental branches of the left lower lobe pulmonary artery.  Right atrium and ventricle are dilated.  There is contrast reflux into the hepatic veins compatible with right heart strain.  Bilateral pleural effusions right greater than left are associated with dependent atelectasis.  There is a lytic bone lesion in the right side of L1 with some central calcified matrix.  No cortical breakthrough.  There is prominent posterior osteophyte at T12-L1 causing an element of spinal stenosis. T12 wedge compression deformity has a chronic appearance.  Diffuse hepatic steatosis.  No abnormal mediastinal adenopathy.  IMPRESSION: The study is positive for bilateral pulmonary thromboembolism. There are findings to suggest right heart strain.  Indeterminate L1 lytic bone lesion.  Malignancy is not excluded. MRI or bone scan can be performed to further characterize.  This may simply represent an atypical angioma.  Bilateral pleural effusions.  Critical Value/emergent results were called by telephone at the time of interpretation on 11/23/2012 at 1320 hours to Dr.Akula, who verbally acknowledged these results.   Original Report Authenticated By: Jolaine Click, M.D.    Dg Chest Portable 1 View  11/23/2012  *RADIOLOGY REPORT*  Clinical Data: 79 year old female with tachycardia.  PORTABLE CHEST - 1 VIEW  Comparison: 11/22/2012 and prior chest radiographs  Findings: Cardiomegaly is again noted.  There is no evidence of focal airspace disease, pulmonary edema, suspicious pulmonary nodule/mass, pleural effusion, or pneumothorax. No acute bony abnormalities are identified.  IMPRESSION: Cardiomegaly without evidence of acute cardiopulmonary disease.   Original Report Authenticated By: Harmon Pier, M.D.     Microbiology: Recent Results (from the past 240 hour(s))  URINE CULTURE     Status: Normal   Collection Time   11/22/12  4:09 PM      Component Value Range Status Comment   Specimen  Description URINE, RANDOM   Final    Special Requests Normal   Final    Culture  Setup Time 11/23/2012 00:16   Final    Colony Count 80,000 COLONIES/ML   Final    Culture     Final    Value: ESCHERICHIA COLI     ENTEROCOCCUS SPECIES   Report Status 11/25/2012 FINAL   Final    Organism ID, Bacteria ESCHERICHIA COLI   Final    Organism ID, Bacteria ENTEROCOCCUS SPECIES   Final   URINE CULTURE     Status: Normal   Collection  Time   11/23/12  4:56 AM      Component Value Range Status Comment   Specimen Description URINE, CLEAN CATCH   Final    Special Requests ADDED 0530   Final    Culture  Setup Time 11/23/2012 05:33   Final    Colony Count 45,000 COLONIES/ML   Final    Culture     Final    Value: GROUP B STREP(S.AGALACTIAE)ISOLATED     Note: TESTING AGAINST S. AGALACTIAE NOT ROUTINELY PERFORMED DUE TO PREDICTABILITY OF AMP/PEN/VAN SUSCEPTIBILITY.   Report Status 11/25/2012 FINAL   Final      Labs: Basic Metabolic Panel:  Lab 11/25/12 1610 11/23/12 0607 11/23/12 0154 11/22/12 1421  NA 139 141 139 135  K 3.7 4.2 3.5 3.4*  CL 103 103 100 95*  CO2 27 30 -- 32  GLUCOSE 129* 130* 104* 123*  BUN 12 15 15 17   CREATININE 0.73 0.76 0.90 0.85  CALCIUM 8.0* 8.5 -- 8.5  MG 1.8 -- -- --  PHOS -- -- -- --   Liver Function Tests:  Lab 11/23/12 0607 11/22/12 1421  AST 16 16  ALT 11 10  ALKPHOS 56 55  BILITOT 0.5 0.7  PROT 6.2 6.2  ALBUMIN 2.6* 2.6*    Lab 11/23/12 0607  LIPASE 10*  AMYLASE --   No results found for this basename: AMMONIA:5 in the last 168 hours CBC:  Lab 11/27/12 0530 11/26/12 0620 11/25/12 0550 11/24/12 0535 11/23/12 0607  WBC 8.3 7.6 6.4 7.8 7.6  NEUTROABS -- -- -- -- 5.4  HGB 11.7* 12.2 10.7* 11.5* 11.8*  HCT 37.2 38.2 34.3* 36.4 37.1  MCV 95.6 95.0 95.8 96.0 94.6  PLT 267 238 211 207 225   Cardiac Enzymes:  Lab 11/24/12 1929 11/23/12 1757 11/23/12 1210 11/23/12 0607 11/22/12 1422  CKTOTAL -- -- -- -- --  CKMB -- -- -- -- --  CKMBINDEX -- -- -- --  --  TROPONINI <0.30 <0.30 <0.30 <0.30 <0.30   BNP: BNP (last 3 results) No results found for this basename: PROBNP:3 in the last 8760 hours CBG: No results found for this basename: GLUCAP:5 in the last 168 hours     Signed:  Henrietta Cieslewicz  [facility] 11/27/2012, 8:39 AM

## 2012-11-29 ENCOUNTER — Telehealth: Payer: Self-pay | Admitting: Internal Medicine

## 2012-11-29 NOTE — Telephone Encounter (Signed)
111/64  HR 69  Her HR was up yesterday for about an hour now it's much better. She is asymptomatic at present.  I have asked her to call if she becomes symptomatic or the heart rates stays up.  Dr Johney Frame aware

## 2012-11-29 NOTE — Telephone Encounter (Signed)
New Problem    Pt stated last night heart rate was 139 and BP 96/67. Later in night 98/70 O2 97 pulse 131. Neck throbbing this morning O2 97 pulse 56. Pt would like to speak to nurse.

## 2012-12-03 ENCOUNTER — Ambulatory Visit (INDEPENDENT_AMBULATORY_CARE_PROVIDER_SITE_OTHER): Payer: Medicare Other | Admitting: Family Medicine

## 2012-12-03 ENCOUNTER — Encounter: Payer: Self-pay | Admitting: Family Medicine

## 2012-12-03 ENCOUNTER — Telehealth: Payer: Self-pay | Admitting: Family Medicine

## 2012-12-03 VITALS — BP 116/80 | HR 58 | Temp 97.5°F | Ht 66.0 in | Wt 211.0 lb

## 2012-12-03 DIAGNOSIS — R600 Localized edema: Secondary | ICD-10-CM

## 2012-12-03 DIAGNOSIS — I10 Essential (primary) hypertension: Secondary | ICD-10-CM

## 2012-12-03 DIAGNOSIS — I471 Supraventricular tachycardia, unspecified: Secondary | ICD-10-CM

## 2012-12-03 DIAGNOSIS — E876 Hypokalemia: Secondary | ICD-10-CM

## 2012-12-03 DIAGNOSIS — J189 Pneumonia, unspecified organism: Secondary | ICD-10-CM | POA: Insufficient documentation

## 2012-12-03 DIAGNOSIS — I2699 Other pulmonary embolism without acute cor pulmonale: Secondary | ICD-10-CM

## 2012-12-03 DIAGNOSIS — R609 Edema, unspecified: Secondary | ICD-10-CM

## 2012-12-03 LAB — BASIC METABOLIC PANEL
BUN: 16 mg/dL (ref 6–23)
CO2: 29 mEq/L (ref 19–32)
Calcium: 9.4 mg/dL (ref 8.4–10.5)
Chloride: 100 mEq/L (ref 96–112)
Creat: 0.85 mg/dL (ref 0.50–1.10)
Glucose, Bld: 99 mg/dL (ref 70–99)

## 2012-12-03 MED ORDER — BENZONATATE 200 MG PO CAPS: 200.0000 mg | ORAL_CAPSULE | Freq: Three times a day (TID) | ORAL | Status: DC | PRN

## 2012-12-03 MED ORDER — AZITHROMYCIN 250 MG PO TABS: ORAL_TABLET | ORAL | Status: DC

## 2012-12-03 NOTE — Patient Instructions (Addendum)
Stop the levaquin- it may be causing nausea  I suspect the metoprolol is causing swelling - discuss this with cardiologist when you are seen and keep elevating legs  You will need an earlier appt with cardiology- Dr Johney Frame - we will call you about that  mucinex plain is ok to loosen cough  Try tessalon to calm cough  Lab today for potassium

## 2012-12-03 NOTE — Telephone Encounter (Signed)
appt scheduled for today @ 4:15 (ok per Dr. Milinda Antis)

## 2012-12-03 NOTE — Telephone Encounter (Signed)
Patient Information:  Caller Name: Addis  Phone: [phone]  Patient: [name], [name]  Gender: Female  DOB: 1933/11/12  Age: 79 Years  PCP: [name] [facility])  Office Follow Up:  Does the office need to follow up with this patient?: Yes  Instructions For The Office: Pt has an hospital F/U appt with Dr. Milinda Antis 12/07/12, but has been having edema in ankles and feet since 12/01/11.  This is a new problems.   I do not see any 30 min appts slots in EPIC.  Can she be worked in in anyway?  TY!   Symptoms  Reason For Call & Symptoms: Pt needs to know if she is supposed to continue  her K+?  Dischaerged from hospital and K=+ not on med sheet and not in EPIC.    Also her feet and legs have been edematous since 11/30/12.   Pt has an appt with Dr. Milinda Antis 12/07/12, but edema is a new problem.  Reviewed Health History In EMR: Yes  Reviewed Medications In EMR: Yes  Reviewed Allergies In EMR: Yes  Reviewed Surgeries / Procedures: Yes  Date of Onset of Symptoms: 11/30/2012  Treatments Tried: Has elevated legs and not helping  Treatments Tried Worked: No  Guideline(s) Used:  Leg Swelling and Edema  Disposition Per Guideline:   See Within 3 Days in Office  Reason For Disposition Reached:   Mild swelling of both ankles (i.e., pedal edema) AND new onset or worsening  Advice Given:  Call Back If:  Swelling becomes worse  Swelling becomes red or painful to the touch  Calf pain occurs and becomes constant  You become worse.

## 2012-12-03 NOTE — Telephone Encounter (Signed)
Will see her then 

## 2012-12-03 NOTE — Progress Notes (Signed)
Subjective:    Patient ID: [name], female    DOB: 10-02-1934, 79 y.o.   MRN: [id number]  HPI Here for leg edema  Pt was recently hospitatlized for SVT with PE---had DVT  (and elevated PA pressures) , uti/also pneumonia  D/c on xarleto, 02, levaquin   Forgot to give her the levaquin  Just started the levaquin yesterday - and has 9 days of it  Nauseated whenever she eats  This began when she started it    Is coughing a lot - dry cough / no blood  Needs cough control med On 2L of oxygen right now  Has cardiology f/u on 2/11    Was s/p TKR at the time  Doing fine from that perspective   Now on metoprolol for control of SVT  Wt is up 4 lb with bmi of 34   Leg swelling started several days ago     Chemistry      Component Value Date/Time   NA 139 11/25/2012 0550   K 3.7 11/25/2012 0550   CL 103 11/25/2012 0550   CO2 27 11/25/2012 0550   BUN 12 11/25/2012 0550   CREATININE 0.73 11/25/2012 0550   CREATININE 0.79 11/19/2012 1626      Component Value Date/Time   CALCIUM 8.0* 11/25/2012 0550   ALKPHOS 56 11/23/2012 0607   AST 16 11/23/2012 0607   ALT 11 11/23/2012 0607   BILITOT 0.5 11/23/2012 7829       Patient Active Problem List  Diagnosis  . HYPERCHOLESTEROLEMIA, PURE  . HYPOKALEMIA  . DECREASED WHITE BLOOD CELL COUNT NOS  . HYPERTENSION, ESSENTIAL NOS  . PSVT  . SCLERODERMA  . DEGENERATIVE JOINT DISEASE  . SPINAL STENOSIS, LUMBAR  . OSTEOPENIA  . PES PLANUS  . SPONDYLOLISTHESIS  . URINARY FREQUENCY, CHRONIC  . Ingrown hair  . Painful total knee replacement, dislocating patella  . Dizziness  . Indigestion  . PE (pulmonary embolism)  . DVT (deep venous thrombosis)  . Pulmonary hypertension, moderate to severe  . Pneumonia  . Pedal edema   Past Medical History  Diagnosis Date  . Osteopenia   . Hyperlipidemia   . Hypertension   . PSVT (paroxysmal supraventricular tachycardia)   . Spinal stenosis   . Overactive bladder     urge incontinence   Past  Surgical History  Procedure Laterality Date  . Cataract extraction  2008  . Bladder suspension  1993  . Joint replacement  2000    left TKR  . Total knee revision  01/12/2012    Procedure: TOTAL KNEE REVISION;  Surgeon: [name], MD;  Location: [facility] OR;  Service: Orthopedics;  Laterality: Left;  DEPUY/ LCS , HAND SET  . Trigger finger release  01/12/2012    Procedure: RELEASE TRIGGER FINGER/A-1 PULLEY;  Surgeon: [name], MD;  Location: [facility] OR;  Service: Orthopedics;  Laterality: Right;  . Cholecystectomy    . Hernia repair    . Total knee arthroplasty  10/29/2012    at [facility]   History  Substance Use Topics  . Smoking status: Never Smoker   . Smokeless tobacco: Not on file  . Alcohol Use: No   Family History  Problem Relation Age of Onset  . Cancer Mother     breast CA  . Heart disease Father     MI  . Diabetes Father   . Hypertension Father   . Cancer Sister     uterine CA   Allergies  Allergen Reactions  . Penicillins     REACTION: hives  . Sulfonamide Derivatives Hives   Current Outpatient Prescriptions on File Prior to Visit  Medication Sig Dispense Refill  . Calcium Carbonate-Vitamin D (CALCIUM-VITAMIN D) 600-200 MG-UNIT CAPS Take 1 capsule by mouth 3 (three) times daily.       . cholecalciferol (VITAMIN D) 1000 UNITS tablet Take 4,000 Units by mouth daily.      . Multiple Vitamins-Iron (MULTIVITAMIN/IRON PO) Take 1 tablet by mouth daily.       . polyethylene glycol (MIRALAX / GLYCOLAX) packet Take 17 g by mouth daily as needed. For constipation      . Rivaroxaban (XARELTO) 15 MG TABS tablet Take 1 tablet (15 mg total) by mouth 2 (two) times daily.  36 tablet  0  . sennosides-docusate sodium (SENOKOT-S) 8.6-50 MG tablet Take 2 tablets by mouth as needed.        No current facility-administered medications on file prior to visit.    Review of Systems Review of Systems  Constitutional: Negative for fever, appetite change,  and unexpected weight change. pos for  fatigue Eyes: Negative for pain and visual disturbance.  Respiratory: Negative for cough and shortness of breath.pos for leg edema   Cardiovascular: Negative for cp or palpitations    Gastrointestinal: Negative for , diarrhea and constipation. pos for nausea but no vomiting  Genitourinary: Negative for urgency and frequency.  Skin: Negative for pallor or rash   MSK pos for knee pain L after TKR that is improving quickly Neurological: Negative for weakness, light-headedness, numbness and headaches.  Hematological: Negative for adenopathy. Does not bruise/bleed easily.  Psychiatric/Behavioral: Negative for dysphoric mood. The patient is not nervous/anxious.         Objective:   Physical Exam  Constitutional: She appears well-developed and well-nourished. No distress.  Elderly female in wheelchair with 02, and L leg in brace  HENT:  Head: Normocephalic and atraumatic.  Right Ear: External ear normal.  Left Ear: External ear normal.  Mouth/Throat: Oropharynx is clear and moist. No oropharyngeal exudate.  Eyes: Conjunctivae and EOM are normal. Pupils are equal, round, and reactive to light. Right eye exhibits no discharge. Left eye exhibits no discharge. No scleral icterus.  Neck: Neck supple. No JVD present. Carotid bruit is not present. No thyromegaly present.  Cardiovascular: Normal rate, regular rhythm, normal heart sounds and intact distal pulses.  Exam reveals no gallop.   Pulmonary/Chest: Effort normal and breath sounds normal. No respiratory distress. She has no wheezes. She exhibits no tenderness.  No crackles or dullness Good air exch  Abdominal: Soft. Bowel sounds are normal. She exhibits no distension, no abdominal bruit and no mass. There is no tenderness.  Musculoskeletal: She exhibits edema.  One plus edema bilat to the knee No palpable cords or tenderness L knee swollen and healing from surgery    Lymphadenopathy:    She has no cervical adenopathy.  Neurological: She  is alert. She has normal reflexes. No cranial nerve deficit. She exhibits normal muscle tone. Coordination normal.  Skin: Skin is warm and dry. No rash noted. No erythema. No pallor.  Psychiatric: She has a normal mood and affect.          Assessment & Plan:

## 2012-12-05 NOTE — Assessment & Plan Note (Signed)
bp in fair control at this time  No changes needed  Disc lifstyle change with low sodium diet and exercise   Will check K level today to see if she needs to go back on that

## 2012-12-05 NOTE — Assessment & Plan Note (Signed)
Controlled by lopressor - with occ inc in pulse rate  Pt will f/u with cardiology as planned  Suspect beta blocker is causing her swelling

## 2012-12-05 NOTE — Assessment & Plan Note (Signed)
S/p DVT after total knee replacement  Currently on 02 and cardiology is watchign pulm pressures  Overall stable on xarleto

## 2012-12-05 NOTE — Assessment & Plan Note (Signed)
Strongly suspect due to beta blocker  Will elevate legs (continue ted hose also) F/u with cardiology planned Pt aware if sob or other symptoms- to update

## 2012-12-05 NOTE — Assessment & Plan Note (Signed)
Pt was not put back on this after hospitalization Level today and update

## 2012-12-05 NOTE — Assessment & Plan Note (Signed)
Changed abx from levaquin to zithromax in light of intolerable nausea and dec appetite Urged to update if no imp in this  Also if inc cough or any  Fever

## 2012-12-07 ENCOUNTER — Ambulatory Visit: Payer: Medicare Other | Admitting: Family Medicine

## 2012-12-07 ENCOUNTER — Encounter: Payer: Self-pay | Admitting: Family Medicine

## 2012-12-07 ENCOUNTER — Ambulatory Visit (INDEPENDENT_AMBULATORY_CARE_PROVIDER_SITE_OTHER): Payer: Medicare Other | Admitting: Family Medicine

## 2012-12-07 ENCOUNTER — Telehealth: Payer: Self-pay

## 2012-12-07 ENCOUNTER — Ambulatory Visit (INDEPENDENT_AMBULATORY_CARE_PROVIDER_SITE_OTHER)
Admission: RE | Admit: 2012-12-07 | Discharge: 2012-12-07 | Disposition: A | Discharge status: Home / Self Care | Payer: Medicare Other | Source: Ambulatory Visit | Attending: Family Medicine | Admitting: Family Medicine

## 2012-12-07 VITALS — BP 118/72 | HR 64 | Temp 98.2°F

## 2012-12-07 DIAGNOSIS — I2699 Other pulmonary embolism without acute cor pulmonale: Secondary | ICD-10-CM

## 2012-12-07 DIAGNOSIS — R0689 Other abnormalities of breathing: Secondary | ICD-10-CM | POA: Insufficient documentation

## 2012-12-07 DIAGNOSIS — R0600 Dyspnea, unspecified: Secondary | ICD-10-CM

## 2012-12-07 DIAGNOSIS — J189 Pneumonia, unspecified organism: Secondary | ICD-10-CM

## 2012-12-07 DIAGNOSIS — R609 Edema, unspecified: Secondary | ICD-10-CM

## 2012-12-07 DIAGNOSIS — R600 Localized edema: Secondary | ICD-10-CM

## 2012-12-07 DIAGNOSIS — R0989 Other specified symptoms and signs involving the circulatory and respiratory systems: Secondary | ICD-10-CM

## 2012-12-07 LAB — CBC WITH DIFFERENTIAL/PLATELET
Basophils Absolute: 0 10*3/uL (ref 0.0–0.1)
Eosinophils Absolute: 0.2 10*3/uL (ref 0.0–0.7)
Lymphocytes Relative: 15.7 % (ref 12.0–46.0)
MCHC: 32.7 g/dL (ref 30.0–36.0)
Monocytes Relative: 8.8 % (ref 3.0–12.0)
Neutro Abs: 5.4 10*3/uL (ref 1.4–7.7)
Neutrophils Relative %: 72.9 % (ref 43.0–77.0)
Platelets: 276 10*3/uL (ref 150.0–400.0)
RDW: 14.4 % (ref 11.5–14.6)

## 2012-12-07 LAB — BRAIN NATRIURETIC PEPTIDE: Pro B Natriuretic peptide (BNP): 751 pg/mL — ABNORMAL HIGH (ref 0.0–100.0)

## 2012-12-07 NOTE — Progress Notes (Signed)
Subjective:    Patient ID: [name], female    DOB: 28-Jul-1934, 79 y.o.   MRN: [id number]  HPI Here for follow up  Pulse ox is 97% She woke up with sob this am - unusual for her -no stress - short lived and now ok  No wheezing or cp  No problems when lying flat   Her O2 tubing is rubbing on her face and head- some sharp pains from that occas- is fine now   bp is good   Changed from levaquin to zithromax for pneumonia dx in hosp Nausea got better for 2 days and then came back this am  (not as bad) No change in diet  Not nauseated at the moment  Also cough is finally starting to improve No fever at all No urinary symptoms    Chemistry      Component Value Date/Time   NA 139 12/03/2012 1736   K 4.2 12/03/2012 1736   CL 100 12/03/2012 1736   CO2 29 12/03/2012 1736   BUN 16 12/03/2012 1736   CREATININE 0.85 12/03/2012 1736   CREATININE 0.73 11/25/2012 0550      Component Value Date/Time   CALCIUM 9.4 12/03/2012 1736   ALKPHOS 56 11/23/2012 0607   AST 16 11/23/2012 0607   ALT 11 11/23/2012 0607   BILITOT 0.5 11/23/2012 0607       Pedal edema is no better - suspect from her beta blocker  Still quite tired overall  Has appt with cardiology Dr Johney Frame tomorrow if weather allows  Patient Active Problem List  Diagnosis  . HYPERCHOLESTEROLEMIA, PURE  . HYPOKALEMIA  . DECREASED WHITE BLOOD CELL COUNT NOS  . HYPERTENSION, ESSENTIAL NOS  . PSVT  . SCLERODERMA  . DEGENERATIVE JOINT DISEASE  . SPINAL STENOSIS, LUMBAR  . OSTEOPENIA  . PES PLANUS  . SPONDYLOLISTHESIS  . URINARY FREQUENCY, CHRONIC  . Painful total knee replacement, dislocating patella  . Dizziness  . Indigestion  . PE (pulmonary embolism)  . DVT (deep venous thrombosis)  . Pulmonary hypertension, moderate to severe  . Pneumonia  . Pedal edema  . Dyspnea and respiratory abnormality  . Right ventricular failure   Past Medical History  Diagnosis Date  . Osteopenia   . Hyperlipidemia   . Hypertension   . PSVT  (paroxysmal supraventricular tachycardia)   . Spinal stenosis   . Overactive bladder     urge incontinence  . Pulmonary emboli   . Pulmonary hypertension   . Right ventricular failure    Past Surgical History  Procedure Laterality Date  . Cataract extraction  2008  . Bladder suspension  1993  . Joint replacement  2000    left TKR  . Total knee revision  01/12/2012    Procedure: TOTAL KNEE REVISION;  Surgeon: [name], MD;  Location: [facility] OR;  Service: Orthopedics;  Laterality: Left;  DEPUY/ LCS , HAND SET  . Trigger finger release  01/12/2012    Procedure: RELEASE TRIGGER FINGER/A-1 PULLEY;  Surgeon: [name], MD;  Location: [facility] OR;  Service: Orthopedics;  Laterality: Right;  . Cholecystectomy    . Hernia repair    . Total knee arthroplasty  10/29/2012    at [facility]   History  Substance Use Topics  . Smoking status: Never Smoker   . Smokeless tobacco: Not on file  . Alcohol Use: No   Family History  Problem Relation Age of Onset  . Cancer Mother     breast  CA  . Heart disease Father     MI  . Diabetes Father   . Hypertension Father   . Cancer Sister     uterine CA   Allergies  Allergen Reactions  . Penicillins     REACTION: hives  . Sulfonamide Derivatives Hives   Current Outpatient Prescriptions on File Prior to Visit  Medication Sig Dispense Refill  . azithromycin (ZITHROMAX Z-PAK) 250 MG tablet Take 2 pills today by mouth and then 1 pill once daily for 4 days  6 each  0  . benzonatate (TESSALON) 200 MG capsule Take 1 capsule (200 mg total) by mouth 3 (three) times daily as needed for cough (swallow whole).  30 capsule  0  . Calcium Carbonate-Vitamin D (CALCIUM-VITAMIN D) 600-200 MG-UNIT CAPS Take 1 capsule by mouth 3 (three) times daily.       . cholecalciferol (VITAMIN D) 1000 UNITS tablet Take 4,000 Units by mouth daily.      . metoprolol (LOPRESSOR) 50 MG tablet Take 50 mg by mouth daily.      . Multiple Vitamins-Iron (MULTIVITAMIN/IRON PO) Take 1 tablet by  mouth daily.       . polyethylene glycol (MIRALAX / GLYCOLAX) packet Take 17 g by mouth daily as needed. For constipation      . sennosides-docusate sodium (SENOKOT-S) 8.6-50 MG tablet Take 2 tablets by mouth as needed.        No current facility-administered medications on file prior to visit.     Review of Systems Review of Systems  Constitutional: Negative for fever, appetite change,  and unexpected weight change. pos for fatigue Eyes: Negative for pain and visual disturbance.  Respiratory: pos cough and shortness of breath.  (improved) Cardiovascular: Negative for cp or palpitations neg for PND or orthopnea     Gastrointestinal: Negative for nausea, diarrhea and constipation.  Genitourinary: Negative for urgency and frequency.  Skin: Negative for pallor or rash   MSK neg for leg cramps Neurological: Negative for weakness, light-headedness, numbness and headaches.  Hematological: Negative for adenopathy. Does not bruise/bleed easily.  Psychiatric/Behavioral: Negative for dysphoric mood. The patient is not nervous/anxious.         Objective:   Physical Exam  Constitutional: She appears well-developed and well-nourished. No distress.  HENT:  Head: Normocephalic and atraumatic.  Mouth/Throat: Oropharynx is clear and moist.  Eyes: Conjunctivae and EOM are normal. Pupils are equal, round, and reactive to light. Right eye exhibits no discharge. Left eye exhibits no discharge.  Neck: Normal range of motion. Neck supple. No JVD present. Carotid bruit is not present. No thyromegaly present.  Cardiovascular: Normal rate, regular rhythm and intact distal pulses.  Exam reveals no gallop.   Pulmonary/Chest: Effort normal and breath sounds normal. No respiratory distress. She has no wheezes. She has no rales.  No rales today Some faint crackles at bases, however Good air exch without wheeze Breathing comfortably  Abdominal: She exhibits no abdominal bruit.  Musculoskeletal: She exhibits  edema.  No change in pedal edema Wearing TED hose  Lymphadenopathy:    She has no cervical adenopathy.  Neurological: She is alert. She exhibits normal muscle tone. Coordination normal.  Skin: Skin is warm and dry. No rash noted. No erythema. No pallor.  Psychiatric: She has a normal mood and affect.          Assessment & Plan:

## 2012-12-07 NOTE — Telephone Encounter (Signed)
She was seen  

## 2012-12-07 NOTE — Telephone Encounter (Signed)
Pt is here for appt (that appears was cancelled when pt was seen on 12/03/12). Pt said f/u for pneumonia and blood clots in lungs. Pt still has trouble getting breath and also thinks needs more antibiotic. No chest pain or fever now.Dr Royden Purl assistant said Dr Milinda Antis will see pt today. Pt notified and is in waiting room(pt has been arrived).

## 2012-12-07 NOTE — Patient Instructions (Addendum)
Continue current medicines  Chest xray now - will call you with result Labs now - will call you  See cardiology as planned If symptoms worsen in the meantime, please let me know

## 2012-12-08 ENCOUNTER — Encounter: Payer: Self-pay | Admitting: Internal Medicine

## 2012-12-08 ENCOUNTER — Ambulatory Visit (INDEPENDENT_AMBULATORY_CARE_PROVIDER_SITE_OTHER): Payer: Medicare Other | Admitting: Internal Medicine

## 2012-12-08 VITALS — BP 128/77 | HR 56 | Ht 66.0 in | Wt 212.0 lb

## 2012-12-08 DIAGNOSIS — I2699 Other pulmonary embolism without acute cor pulmonale: Secondary | ICD-10-CM

## 2012-12-08 DIAGNOSIS — R0600 Dyspnea, unspecified: Secondary | ICD-10-CM

## 2012-12-08 DIAGNOSIS — R0609 Other forms of dyspnea: Secondary | ICD-10-CM

## 2012-12-08 DIAGNOSIS — I272 Pulmonary hypertension, unspecified: Secondary | ICD-10-CM

## 2012-12-08 DIAGNOSIS — I82409 Acute embolism and thrombosis of unspecified deep veins of unspecified lower extremity: Secondary | ICD-10-CM

## 2012-12-08 DIAGNOSIS — I509 Heart failure, unspecified: Secondary | ICD-10-CM

## 2012-12-08 DIAGNOSIS — I50810 Right heart failure, unspecified: Secondary | ICD-10-CM

## 2012-12-08 DIAGNOSIS — I2789 Other specified pulmonary heart diseases: Secondary | ICD-10-CM

## 2012-12-08 DIAGNOSIS — I471 Supraventricular tachycardia: Secondary | ICD-10-CM

## 2012-12-08 MED ORDER — RIVAROXABAN 20 MG PO TABS: ORAL_TABLET | ORAL | Status: DC

## 2012-12-08 MED ORDER — FUROSEMIDE 40 MG PO TABS: 40.0000 mg | ORAL_TABLET | Freq: Every day | ORAL | Status: DC

## 2012-12-08 NOTE — Patient Instructions (Addendum)
Your physician recommends that you schedule a follow-up appointment in: 2 weeks with [name] and Dr Johney Frame in 2 months   Will repeat CBC and BMP in 2 weeks with Tereso Newcomer  Your physician has recommended you make the following change in your medication:  1) Start Furosemide 40mg  daily 2) Change Xarelto to 20mg  daily on 2/20   Use oxygen only as needed

## 2012-12-09 ENCOUNTER — Encounter: Payer: Self-pay | Admitting: Internal Medicine

## 2012-12-09 DIAGNOSIS — I50811 Acute right heart failure: Secondary | ICD-10-CM | POA: Insufficient documentation

## 2012-12-09 NOTE — Assessment & Plan Note (Signed)
With ambulation today, sats maintained >90% on room air She will therefore start using her O2 as needed.  We can reassess when she returns as she may not need O2 long term.

## 2012-12-09 NOTE — Progress Notes (Signed)
PCP:  [name], MD  The patient presents today for cardiology followup after her recent hospitalization for PTE during which she had SVT.  Since her hospital discharge, she has done reasonably well.  Her SOB is much improved.  She continues to have occasional SVT which she describes as tachypalpitations lasting about 5 minutes.  These are infrequent.  She reports worsening edema recently. Today, she denies symptoms of chest pain,orthopnea, PND, dizziness, presyncope, syncope, or neurologic sequela.  The patient feels that she is tolerating medications without difficulties and is otherwise without complaint today.   Past Medical History  Diagnosis Date  . Osteopenia   . Hyperlipidemia   . Hypertension   . PSVT (paroxysmal supraventricular tachycardia)   . Spinal stenosis   . Overactive bladder     urge incontinence  . Pulmonary emboli   . Pulmonary hypertension   . Right ventricular failure    Past Surgical History  Procedure Laterality Date  . Cataract extraction  2008  . Bladder suspension  1993  . Joint replacement  2000    left TKR  . Total knee revision  01/12/2012    Procedure: TOTAL KNEE REVISION;  Surgeon: [name], MD;  Location: [facility] OR;  Service: Orthopedics;  Laterality: Left;  DEPUY/ LCS , HAND SET  . Trigger finger release  01/12/2012    Procedure: RELEASE TRIGGER FINGER/A-1 PULLEY;  Surgeon: [name], MD;  Location: [facility] OR;  Service: Orthopedics;  Laterality: Right;  . Cholecystectomy    . Hernia repair    . Total knee arthroplasty  10/29/2012    at [facility]    Current Outpatient Prescriptions  Medication Sig Dispense Refill  . azithromycin (ZITHROMAX Z-PAK) 250 MG tablet Take 2 pills today by mouth and then 1 pill once daily for 4 days  6 each  0  . benzonatate (TESSALON) 200 MG capsule Take 1 capsule (200 mg total) by mouth 3 (three) times daily as needed for cough (swallow whole).  30 capsule  0  . Calcium Carbonate-Vitamin D (CALCIUM-VITAMIN D) 600-200  MG-UNIT CAPS Take 1 capsule by mouth 3 (three) times daily.       . cholecalciferol (VITAMIN D) 1000 UNITS tablet Take 4,000 Units by mouth daily.      . metoprolol (LOPRESSOR) 50 MG tablet Take 50 mg by mouth daily.      . Multiple Vitamins-Iron (MULTIVITAMIN/IRON PO) Take 1 tablet by mouth daily.       . polyethylene glycol (MIRALAX / GLYCOLAX) packet Take 17 g by mouth daily as needed. For constipation      . Rivaroxaban (XARELTO) 20 MG TABS Change to one daily on 2/20 this is DVT protocol  30 tablet  6  . sennosides-docusate sodium (SENOKOT-S) 8.6-50 MG tablet Take 2 tablets by mouth as needed.       . furosemide (LASIX) 40 MG tablet Take 1 tablet (40 mg total) by mouth daily.  90 tablet  3   No current facility-administered medications for this visit.    Allergies  Allergen Reactions  . Penicillins     REACTION: hives  . Sulfonamide Derivatives Hives    History   Social History  . Marital Status: Married    Spouse Name: N/A    Number of Children: N/A  . Years of Education: N/A   Occupational History  . Retired    Social History Main Topics  . Smoking status: Never Smoker   . Smokeless tobacco: Not on file  .  Alcohol Use: No  . Drug Use: No  . Sexually Active: No   Other Topics Concern  . Not on file   Social History Narrative   Pt lives alone but dtr lives on property and has been staying with her since surgery.    Family History  Problem Relation Age of Onset  . Cancer Mother     breast CA  . Heart disease Father     MI  . Diabetes Father   . Hypertension Father   . Cancer Sister     uterine CA    ROS-  All systems are reviewed and are negative except as outlined in the HPI above   Physical Exam: Filed Vitals:   12/08/12 1205 12/08/12 1237 12/08/12 1238  BP: 128/77    Pulse: 56    Height: 5\' 6"  (1.676 m)    Weight: 212 lb (96.163 kg)    SpO2:  98% 90%    GEN- The patient is elderly appearing, alert and oriented x 3 today.   Head-  normocephalic, atraumatic Eyes-  Sclera clear, conjunctiva pink Ears- hearing intact Oropharynx- clear Neck- supple, JVP 9cm Lungs- Clear to ausculation bilaterally, normal work of breathing Heart- Regular rate and rhythm, no murmurs, rubs or gallops, PMI not laterally displaced GI- soft, NT, ND, + BS Extremities- no clubbing, cyanosis, +1 bilateral edema MS- no significant deformity or atrophy Skin- no rash or lesion Psych- euthymic mood, full affect Neuro- strength and sensation are intact  ekg today reveals sinus rhythm 55 bpm, PR 178 msec, nonspecific ST/T changes  Assessment and Plan:

## 2012-12-09 NOTE — Assessment & Plan Note (Signed)
Seems managable at this point Once she has fully recovered from her PTE, we will entertain ablation as an option (3 months or so) Continue current regimen

## 2012-12-09 NOTE — Assessment & Plan Note (Signed)
Due to recent PTE most likely She is currently in the acute treatment phase with xarelto 15mg  BID.  She will change to 20mg  daily after 21 days of treatment. Will repeat echo in 2-3 months

## 2012-12-09 NOTE — Assessment & Plan Note (Signed)
Due to recent PTE She now has edema  Will start lasix 40mg  daily Return in 2 weeks to see [name].  Will need BMET at that time

## 2012-12-09 NOTE — Assessment & Plan Note (Signed)
As above.

## 2012-12-10 NOTE — Assessment & Plan Note (Signed)
Unchanged - with TED hose  Suspect worsened by beta blocker

## 2012-12-10 NOTE — Assessment & Plan Note (Signed)
cxr today - improved  Will continue zithromax if not too much nausea Cough is imp also  Pt will continue to update

## 2012-12-10 NOTE — Assessment & Plan Note (Signed)
PND and cbc today s/p knee surg and PE  Re check of cxr shows imp in pneumonia

## 2012-12-10 NOTE — Assessment & Plan Note (Signed)
On xarleto and 02 Still occ sob

## 2012-12-13 ENCOUNTER — Telehealth: Payer: Self-pay | Admitting: Internal Medicine

## 2012-12-13 ENCOUNTER — Telehealth: Payer: Self-pay | Admitting: Family Medicine

## 2012-12-13 NOTE — Telephone Encounter (Signed)
New problem   No blood pressure was taken daughter is concern with pulse.     C/O 2 hrs on yesterday pulse  137 oxygen level  97. On room air   10:30 this  Pulse 141. Ox 97 .

## 2012-12-13 NOTE — Telephone Encounter (Signed)
Pt said she is feeling better but hasn't gotten a call back from her cardiologist. Pt's daughter said that yesterday her hear rate went up to 165 for over 2 hrs then went back down to normal, and then today her heart rate went up to the 130-140s for over 2 hours and went back down, pt's daughter is still monitoring pt but will wait to hear from cardio before doing anything

## 2012-12-13 NOTE — Telephone Encounter (Signed)
[facility] Triage Call Report Triage Record Num: [id number] Operator: Caswell Corwin [name] Call Date & Time: 12/11/2012 1:22:20PM Patient Phone: [phone] PCP: Idamae Schuller A. Tower Patient Gender: Female PCP Fax : Patient DOB: December 10, 1933 Practice Name: [facility] Reason for Call: Caller: [name]/Other; PCP: [name] [facility]); CB#: [id number]; Had an elevated pulse from 10:15-1200 around 143. Pt has a history of SVT. 12/08/12 saw heart Dr., Dr. Johney Frame 12/08/12 and note in EPIC. Pt states she feels "sluggish and weak". She has edema in her feet and ankles that started 12/07/12. She is taking her Lasix 40 mg QD that was started 12/08/12. She has felt weak for several days. She is using her O/2 at 2 liters. Triaged Edema Autramatic and needs to be seen in 24 hrs for new or worsening fatigue, weakness, confusion or change in function. States she feels worse today than she did 12/10/12. Home care and call back instrucions given. Instructed to call Cardiology office today for SX also. Protocol(s) Used: Edema, Atraumatic Recommended Outcome per Protocol: See Provider within 24 hours Reason for Outcome: New or worsening fatigue, weakness, confusion, or change in function. Care Advice: ~ Call provider if symptoms worsen or new symptoms develop. ~ Rest in a reclining chair or elevate head and chest on 2 or 3 pillows when lying down to make breathing easier. ~ List, or take, all current prescription(s), nonprescription or alternative medication(s) to provider for evaluation. 12/11/2012 1:48:02PM Page 1 of 1 [email or web address]

## 2012-12-13 NOTE — Telephone Encounter (Signed)
Spoke with daughter and discussed with Dr Johney Frame.  Patient BP is an issue as well being that it is 80/50 today.  Dr Johney Frame says okay to decrease Furosemide to 20mg  daily and will then be able to take the extra Metoprolol 25mg  for the fast heart rates.  She is doing okay now and will call me back if she has more problems

## 2012-12-13 NOTE — Telephone Encounter (Signed)
Please check in with her- I think she placed a call to cardiology, thanks

## 2012-12-13 NOTE — Telephone Encounter (Signed)
Thanks for the update - will forward this to her cardiologist as well - if symptoms worsen in the interim please let me know

## 2012-12-20 ENCOUNTER — Ambulatory Visit (INDEPENDENT_AMBULATORY_CARE_PROVIDER_SITE_OTHER): Payer: Medicare Other | Admitting: Family Medicine

## 2012-12-20 ENCOUNTER — Encounter: Payer: Self-pay | Admitting: Family Medicine

## 2012-12-20 VITALS — BP 110/68 | HR 61 | Temp 97.4°F | Ht 66.0 in | Wt 198.5 lb

## 2012-12-20 DIAGNOSIS — J189 Pneumonia, unspecified organism: Secondary | ICD-10-CM

## 2012-12-20 DIAGNOSIS — I10 Essential (primary) hypertension: Secondary | ICD-10-CM

## 2012-12-20 DIAGNOSIS — R609 Edema, unspecified: Secondary | ICD-10-CM

## 2012-12-20 DIAGNOSIS — E876 Hypokalemia: Secondary | ICD-10-CM

## 2012-12-20 DIAGNOSIS — R600 Localized edema: Secondary | ICD-10-CM

## 2012-12-20 LAB — BASIC METABOLIC PANEL
Calcium: 9.5 mg/dL (ref 8.4–10.5)
GFR: 55.83 mL/min — ABNORMAL LOW (ref >60.00)
Glucose, Bld: 85 mg/dL (ref 70–99)
Sodium: 141 mEq/L (ref 135–145)

## 2012-12-20 MED ORDER — BENZONATATE 200 MG PO CAPS: 200.0000 mg | ORAL_CAPSULE | Freq: Three times a day (TID) | ORAL | Status: DC | PRN

## 2012-12-20 NOTE — Assessment & Plan Note (Signed)
bp is stable today  No cp or palpitations or headaches or edema  No side effects to medicines  BP Readings from Last 3 Encounters:  12/20/12 110/68  12/08/12 128/77  12/07/12 118/72     bp is back to normal with less lasix-changed in chart bmet today

## 2012-12-20 NOTE — Progress Notes (Signed)
Subjective:    Patient ID: [name], female    DOB: [birth year], 79 y.o.   MRN: [id number]  HPI Here for continued f/u of post hosp issues- DVT, pulm HTN from that with edema and PSVT  DVT and knee repl Can bend her knee 45 degrees now -happy about that - doing a lot of tx On xarleto   She saw cardiology Dr Johney Frame on 2/12 Did start her on lasix for edema at 40 mg but had to decrease it to 20 due to HTN Still some but not as much as it was  Last K good before lasix-no cramps at all   pulm HTN- thought to be due to PE and echo planned 2-3 mo   PE- her O2 was changed to prn use with good pulse ox It is 98 % today She is only sob on exertion   PSVT- considering ablation in 3 mo  Pulse is still up and down   Cough is still with her - needs refil of her tessalon pearles - they do help  Only coughs when she talks a lot or laughs Little sputum-all clear  bp is stable today  No cp or palpitations or headaches or edema  No side effects to medicines  BP Readings from Last 3 Encounters:  12/20/12 110/68  12/08/12 128/77  12/07/12 118/72        Chemistry      Component Value Date/Time   NA 139 12/03/2012 1736   K 4.2 12/03/2012 1736   CL 100 12/03/2012 1736   CO2 29 12/03/2012 1736   BUN 16 12/03/2012 1736   CREATININE 0.85 12/03/2012 1736   CREATININE 0.73 11/25/2012 0550      Component Value Date/Time   CALCIUM 9.4 12/03/2012 1736   ALKPHOS 56 11/23/2012 0607   AST 16 11/23/2012 0607   ALT 11 11/23/2012 0607   BILITOT 0.5 11/23/2012 0607     Lab Results  Component Value Date   WBC 7.4 12/07/2012   HGB 12.1 12/07/2012   HCT 36.9 12/07/2012   MCV 91.5 12/07/2012   PLT 276.0 12/07/2012    Lab Results  Component Value Date   TSH 4.422 11/23/2012     Patient Active Problem List  Diagnosis  . HYPERCHOLESTEROLEMIA, PURE  . HYPOKALEMIA  . DECREASED WHITE BLOOD CELL COUNT NOS  . HYPERTENSION, ESSENTIAL NOS  . PSVT  . SCLERODERMA  . DEGENERATIVE JOINT DISEASE  . SPINAL STENOSIS,  LUMBAR  . OSTEOPENIA  . PES PLANUS  . SPONDYLOLISTHESIS  . URINARY FREQUENCY, CHRONIC  . Painful total knee replacement, dislocating patella  . Dizziness  . Indigestion  . PE (pulmonary embolism)  . DVT (deep venous thrombosis)  . Pulmonary hypertension, moderate to severe  . Pneumonia  . Pedal edema  . Dyspnea and respiratory abnormality  . Right ventricular failure   Past Medical History  Diagnosis Date  . Osteopenia   . Hyperlipidemia   . Hypertension   . PSVT (paroxysmal supraventricular tachycardia)   . Spinal stenosis   . Overactive bladder     urge incontinence  . Pulmonary emboli   . Pulmonary hypertension   . Right ventricular failure    Past Surgical History  Procedure Laterality Date  . Cataract extraction  2008  . Bladder suspension  1993  . Joint replacement  2000    left TKR  . Total knee revision  01/12/2012    Procedure: TOTAL KNEE REVISION;  Surgeon: [name], MD;  Location: [facility] OR;  Service: Orthopedics;  Laterality: Left;  DEPUY/ LCS , HAND SET  . Trigger finger release  01/12/2012    Procedure: RELEASE TRIGGER FINGER/A-1 PULLEY;  Surgeon: [name], MD;  Location: [facility] OR;  Service: Orthopedics;  Laterality: Right;  . Cholecystectomy    . Hernia repair    . Total knee arthroplasty  10/29/2012    at [facility]   History  Substance Use Topics  . Smoking status: Never Smoker   . Smokeless tobacco: Not on file  . Alcohol Use: No   Family History  Problem Relation Age of Onset  . Cancer Mother     breast CA  . Heart disease Father     MI  . Diabetes Father   . Hypertension Father   . Cancer Sister     uterine CA   Allergies  Allergen Reactions  . Penicillins     REACTION: hives  . Sulfonamide Derivatives Hives   Current Outpatient Prescriptions on File Prior to Visit  Medication Sig Dispense Refill  . benzonatate (TESSALON) 200 MG capsule Take 1 capsule (200 mg total) by mouth 3 (three) times daily as needed for cough (swallow whole).   30 capsule  0  . Calcium Carbonate-Vitamin D (CALCIUM-VITAMIN D) 600-200 MG-UNIT CAPS Take 1 capsule by mouth 3 (three) times daily.       . cholecalciferol (VITAMIN D) 1000 UNITS tablet Take 4,000 Units by mouth daily.      . furosemide (LASIX) 40 MG tablet Take 1 tablet (40 mg total) by mouth daily.  90 tablet  3  . metoprolol (LOPRESSOR) 50 MG tablet Take 50 mg by mouth daily.      . Multiple Vitamins-Iron (MULTIVITAMIN/IRON PO) Take 1 tablet by mouth daily.       . polyethylene glycol (MIRALAX / GLYCOLAX) packet Take 17 g by mouth daily as needed. For constipation      . Rivaroxaban (XARELTO) 20 MG TABS Change to one daily on 2/20 this is DVT protocol  30 tablet  6  . sennosides-docusate sodium (SENOKOT-S) 8.6-50 MG tablet Take 2 tablets by mouth as needed.        No current facility-administered medications on file prior to visit.    Review of Systems Review of Systems  Constitutional: Negative for fever, appetite change, and unexpected weight change.  Eyes: Negative for pain and visual disturbance.  Respiratory: Negative for cough and pos for sob on exertion which is improving Cardiovascular: Negative for cp or palpitations   pos for occasional fast HR Gastrointestinal: Negative for nausea, diarrhea and constipation.  Genitourinary: Negative for urgency and frequency.  Skin: Negative for pallor or rash   Neurological: Negative for weakness, light-headedness, numbness and headaches.  Hematological: Negative for adenopathy. Does not bruise/bleed easily.  Psychiatric/Behavioral: Negative for dysphoric mood. The patient is not nervous/anxious.         Objective:   Physical Exam  Constitutional: She appears well-developed and well-nourished. No distress.  HENT:  Head: Normocephalic and atraumatic.  Right Ear: External ear normal.  Left Ear: External ear normal.  Nose: Nose normal.  Mouth/Throat: Oropharynx is clear and moist. No oropharyngeal exudate.  Eyes: Conjunctivae and EOM  are normal. Pupils are equal, round, and reactive to light. Right eye exhibits no discharge. Left eye exhibits no discharge. No scleral icterus.  Neck: Normal range of motion. Neck supple. No JVD present. Carotid bruit is not present. No thyromegaly present.  Cardiovascular: Normal rate, regular rhythm, normal heart  sounds and intact distal pulses.  Exam reveals no gallop.   Pulmonary/Chest: Effort normal and breath sounds normal. No respiratory distress. She has no wheezes.  No crackles or rales  Abdominal: Soft. She exhibits no distension, no abdominal bruit and no mass. There is no tenderness.  Musculoskeletal: She exhibits edema.  Trace pedal edema -improved Brace on L knee  Lymphadenopathy:    She has no cervical adenopathy.  Neurological: She is alert. She has normal reflexes. She exhibits normal muscle tone. Coordination normal.  Skin: Skin is warm and dry. No rash noted. No erythema. No pallor.  Psychiatric: She has a normal mood and affect.          Assessment & Plan:

## 2012-12-20 NOTE — Assessment & Plan Note (Signed)
Much improved with lasix 20 mg  Lab today

## 2012-12-20 NOTE — Patient Instructions (Addendum)
I'm glad you are feeling better  Use tessalon for cough as needed - update me if this does not improve or if it worsens  Labs for potassium today in light of the lasix (furosemide) you are taking which can lower potassium Use the oxygen as needed No medicine changes

## 2012-12-20 NOTE — Assessment & Plan Note (Signed)
Clinically resolved except for cough (slowly imp)- which may also be from PE  No hemoptysis Did refil tessalon Adv to update if worse or no improvement

## 2012-12-20 NOTE — Assessment & Plan Note (Signed)
Check K today since she is back on 20 mg lasix daily No cramping

## 2012-12-21 ENCOUNTER — Encounter: Payer: Self-pay | Admitting: *Deleted

## 2012-12-23 ENCOUNTER — Other Ambulatory Visit: Payer: Self-pay

## 2012-12-23 MED ORDER — METOPROLOL TARTRATE 50 MG PO TABS: 50.0000 mg | ORAL_TABLET | Freq: Every day | ORAL | Status: DC

## 2012-12-23 NOTE — Telephone Encounter (Signed)
Pt request refill metoprolol to [facility]; advised pt done.

## 2012-12-28 ENCOUNTER — Encounter: Payer: Medicare Other | Admitting: Physician Assistant

## 2012-12-30 ENCOUNTER — Encounter (HOSPITAL_COMMUNITY): Payer: Self-pay

## 2012-12-30 ENCOUNTER — Emergency Department (HOSPITAL_COMMUNITY): Payer: Medicare Other

## 2012-12-30 ENCOUNTER — Emergency Department (HOSPITAL_COMMUNITY)
Admission: EM | Admit: 2012-12-30 | Discharge: 2012-12-31 | Disposition: A | Discharge status: Home / Self Care | Payer: Medicare Other | Attending: Emergency Medicine | Admitting: Emergency Medicine

## 2012-12-30 ENCOUNTER — Telehealth: Payer: Self-pay | Admitting: Family Medicine

## 2012-12-30 DIAGNOSIS — Z862 Personal history of diseases of the blood and blood-forming organs and certain disorders involving the immune mechanism: Secondary | ICD-10-CM | POA: Insufficient documentation

## 2012-12-30 DIAGNOSIS — Z79899 Other long term (current) drug therapy: Secondary | ICD-10-CM | POA: Insufficient documentation

## 2012-12-30 DIAGNOSIS — Z8679 Personal history of other diseases of the circulatory system: Secondary | ICD-10-CM | POA: Insufficient documentation

## 2012-12-30 DIAGNOSIS — I498 Other specified cardiac arrhythmias: Secondary | ICD-10-CM | POA: Insufficient documentation

## 2012-12-30 DIAGNOSIS — I471 Supraventricular tachycardia, unspecified: Secondary | ICD-10-CM | POA: Diagnosis present

## 2012-12-30 DIAGNOSIS — Z8639 Personal history of other endocrine, nutritional and metabolic disease: Secondary | ICD-10-CM | POA: Insufficient documentation

## 2012-12-30 DIAGNOSIS — I10 Essential (primary) hypertension: Secondary | ICD-10-CM | POA: Insufficient documentation

## 2012-12-30 DIAGNOSIS — Z86711 Personal history of pulmonary embolism: Secondary | ICD-10-CM | POA: Insufficient documentation

## 2012-12-30 LAB — POCT I-STAT TROPONIN I
Troponin i, poc: 0.01 ng/mL (ref 0.00–0.08)
Troponin i, poc: 0.02 ng/mL (ref 0.00–0.08)

## 2012-12-30 LAB — BASIC METABOLIC PANEL
BUN: 23 mg/dL (ref 6–23)
BUN: 24 mg/dL — ABNORMAL HIGH (ref 6–23)
CO2: 28 mEq/L (ref 19–32)
CO2: 31 mEq/L (ref 19–32)
Calcium: 10 mg/dL (ref 8.4–10.5)
Calcium: 9.9 mg/dL (ref 8.4–10.5)
Chloride: 103 mEq/L (ref 96–112)
Chloride: 101 mEq/L (ref 96–112)
Creatinine, Ser: 0.93 mg/dL (ref 0.50–1.10)
Creatinine, Ser: 0.93 mg/dL (ref 0.50–1.10)
GFR calc Af Amer: 67 mL/min — ABNORMAL LOW (ref >90)
GFR calc Af Amer: 67 mL/min — ABNORMAL LOW (ref >90)
GFR calc non Af Amer: 58 mL/min — ABNORMAL LOW (ref >90)
GFR calc non Af Amer: 58 mL/min — ABNORMAL LOW (ref >90)
Glucose, Bld: 113 mg/dL — ABNORMAL HIGH (ref 70–99)
Glucose, Bld: 110 mg/dL — ABNORMAL HIGH (ref 70–99)
Potassium: 4.1 mEq/L (ref 3.5–5.1)
Potassium: 4.1 mEq/L (ref 3.5–5.1)
Sodium: 142 mEq/L (ref 135–145)
Sodium: 142 mEq/L (ref 135–145)

## 2012-12-30 LAB — CBC
HCT: 48.9 % — ABNORMAL HIGH (ref 36.0–46.0)
Hemoglobin: 15.8 g/dL — ABNORMAL HIGH (ref 12.0–15.0)
MCH: 29.6 pg (ref 26.0–34.0)
MCHC: 32.3 g/dL (ref 30.0–36.0)
MCV: 91.7 fL (ref 78.0–100.0)
Platelets: 238 10*3/uL (ref 150–400)
RBC: 5.33 MIL/uL — ABNORMAL HIGH (ref 3.87–5.11)
RDW: 13.9 % (ref 11.5–15.5)
WBC: 6.2 10*3/uL (ref 4.0–10.5)

## 2012-12-30 LAB — MAGNESIUM: Magnesium: 2.3 mg/dL (ref 1.5–2.5)

## 2012-12-30 MED ORDER — VERAPAMIL HCL ER 120 MG PO TBCR: 120.0000 mg | EXTENDED_RELEASE_TABLET | Freq: Every day | ORAL | Status: DC

## 2012-12-30 MED ORDER — METOPROLOL TARTRATE 1 MG/ML IV SOLN
2.5000 mg | Freq: Once | INTRAVENOUS | Status: AC
  Administered 2012-12-30: 2.5 mg via INTRAVENOUS
  Filled 2012-12-30: qty 5

## 2012-12-30 MED ORDER — VERAPAMIL HCL ER 120 MG PO TBCR
120.0000 mg | EXTENDED_RELEASE_TABLET | Freq: Once | ORAL | Status: AC
  Administered 2012-12-30: 120 mg via ORAL
  Filled 2012-12-30: qty 1

## 2012-12-30 MED ORDER — ADENOSINE 6 MG/2ML IV SOLN
12.0000 mg | Freq: Once | INTRAVENOUS | Status: AC
  Administered 2012-12-30: 12 mg via INTRAVENOUS
  Filled 2012-12-30: qty 4

## 2012-12-30 MED ORDER — ADENOSINE 6 MG/2ML IV SOLN
6.0000 mg | Freq: Once | INTRAVENOUS | Status: AC
  Administered 2012-12-30: 6 mg via INTRAVENOUS
  Filled 2012-12-30: qty 2

## 2012-12-30 MED ORDER — METOPROLOL TARTRATE 50 MG PO TABS: 25.0000 mg | ORAL_TABLET | Freq: Every day | ORAL | Status: DC

## 2012-12-30 NOTE — ED Provider Notes (Signed)
History     CSN: [id number]  Arrival date & time 12/30/12  1543   First MD Initiated Contact with Patient 12/30/12 1635      Chief Complaint  Patient presents with  . Chest Pain   Patient is a 79 y.o. female presenting with chest pain.  Chest Pain  Pt with Hx of PSVT and recent PE/DVT on anticoagulation with Xarelto presents to ED after a nurse(HH) found her to be tachycardic at home. The only symptom pt reports is mild chest tightness upon arrival to ED. Pt denies at this time palpitations, chest pain, SOB, lightheadedness or dizziness.  Pt f/u with [facility] she was seen on 2/12 for LE edema and started on Furosemide 40 that was reduced to 20 mg due to low BP. Pt stopped taking furosemide a week ago due to resolution of edema. Pt is taking Metoprolol 25 mg daily and reports compliance. The plan with her PSVT was ablation in 3 months per Cards documentation.  Past Medical History  Diagnosis Date  . Osteopenia   . Hyperlipidemia   . Hypertension   . PSVT (paroxysmal supraventricular tachycardia)   . Spinal stenosis   . Overactive bladder     urge incontinence  . Pulmonary emboli   . Pulmonary hypertension   . Right ventricular failure     Past Surgical History  Procedure Laterality Date  . Cataract extraction  2008  . Bladder suspension  1993  . Joint replacement  2000    left TKR  . Total knee revision  01/12/2012    Procedure: TOTAL KNEE REVISION;  Surgeon: [name], MD;  Location: [facility] OR;  Service: Orthopedics;  Laterality: Left;  DEPUY/ LCS , HAND SET  . Trigger finger release  01/12/2012    Procedure: RELEASE TRIGGER FINGER/A-1 PULLEY;  Surgeon: [name], MD;  Location: [facility] OR;  Service: Orthopedics;  Laterality: Right;  . Cholecystectomy    . Hernia repair    . Total knee arthroplasty  10/29/2012    at [facility]    Family History  Problem Relation Age of Onset  . Cancer Mother     breast CA  . Heart disease Father     MI  . Diabetes Father   .  Hypertension Father   . Cancer Sister     uterine CA    History  Substance Use Topics  . Smoking status: Never Smoker   . Smokeless tobacco: Not on file  . Alcohol Use: No    OB History   Grav Para Term Preterm Abortions TAB SAB Ect Mult Living                  Review of Systems  Constitutional: Negative.   HENT: Negative.   Respiratory: Negative.   Cardiovascular:       Chest tightness today upon arrival to ED  Gastrointestinal: Negative.   Genitourinary: Negative.   Musculoskeletal: Negative.   Skin: Negative.   Neurological: Negative.     Allergies  Penicillins and Sulfonamide derivatives  Home Medications   Current Outpatient Rx  Name  Route  Sig  Dispense  Refill  . benzonatate (TESSALON) 200 MG capsule   Oral   Take 1 capsule (200 mg total) by mouth 3 (three) times daily as needed for cough (swallow whole).   30 capsule   2   . Calcium Carbonate-Vitamin D (CALCIUM-VITAMIN D) 600-200 MG-UNIT CAPS   Oral   Take 1 capsule by mouth 3 (three)  times daily.          . cholecalciferol (VITAMIN D) 1000 UNITS tablet   Oral   Take 4,000 Units by mouth daily.         . metoprolol (LOPRESSOR) 50 MG tablet   Oral   Take 1 tablet (50 mg total) by mouth daily.   30 tablet   5   . Multiple Vitamins-Iron (MULTIVITAMIN/IRON PO)   Oral   Take 1 tablet by mouth daily.          . polyethylene glycol (MIRALAX / GLYCOLAX) packet   Oral   Take 17 g by mouth daily as needed. For constipation         . Rivaroxaban (XARELTO) 20 MG TABS   Oral   Take 20 mg by mouth daily.           BP 128/84  Pulse 149  Temp(Src) 97.8 F (36.6 C) (Oral)  Resp 20  SpO2 97%  Physical Exam Gen:  Very pleasant and cooperative with examination. NAD HEENT: Moist mucous membranes. Neck: Jugular Venous Pulse(JVP) present. CV: Tachycardic and regular rhythm, no murmurs rubs or gallops. PULM: Clear to auscultation bilaterally. No wheezes/rales/rhonchi ABD: Soft, non  tender, non distended, normal bowel sounds EXT: No edema. Left leg with removable ortho boot. Neuro: Alert and oriented x3. No focalization   ED Course  Procedures (including critical care time)  Labs Reviewed  CBC - Abnormal; Notable for the following:    RBC 5.33 (*)    Hemoglobin 15.8 (*)    HCT 48.9 (*)    All other components within normal limits  BASIC METABOLIC PANEL  CBC  MAGNESIUM  BASIC METABOLIC PANEL   Dg Chest 2 View  12/30/2012  *RADIOLOGY REPORT*  Clinical Data: Chest pain and palpitations.  CHEST - 2 VIEW  Comparison: Chest x-ray 12/07/2012.  Findings: Lung volumes are normal.  No acute consolidative airspace disease.  Blunting of the costophrenic sulci bilaterally is again noted, and may simply represent chronic pleural scarring, or a trace chronic bilateral pleural effusions.  Pulmonary vasculature is within normal limits.  Heart size is mildly enlarged. Tortuosity and atherosclerosis throughout the thoracic aorta. Chronically increased interstitial markings throughout the mid and lower lungs bilaterally is unchanged.  Bilateral apical pleuroparenchymal thickening, somewhat nodular appearance, unchanged compared to prior examination and most compatible with chronic scarring.  IMPRESSION: 1.  No radiographic evidence of acute cardiopulmonary disease.  The appearance of chest is similar to prior examinations, as above. 2.  Mild cardiomegaly. 3.  Atherosclerosis.   Original Report Authenticated By: Trudie Reed, M.D.    No diagnosis found.  MDM  EKG >>SVT, negative for STEMI - Will obtain troponin I, Bmet and Mg - Metoprolol 2.5 mg IV x1 and evaluate response.         Dayarmys Piloto de Criselda Peaches, MD 12/30/12 1729  Dayarmys Piloto de Criselda Peaches, MD 12/30/12 [phone]

## 2012-12-30 NOTE — ED Provider Notes (Addendum)
I saw and evaluated the patient, reviewed the resident's note and I agree with the findings and plan.  I personally reviewed the pt's EKG.   79 year old female palpitations. EKG is consistent with a supraventricular tachycardia. Patient has a history of same. She is followed by [facility] cardiology for this. She is on the beta blocker and she reports compliance. Tentative plan for ablation, but delayed secondary to recent diagnosis of pulmonary embolism which she is on xarelto for. Patient received small doses of metoprolol, 2.5 mg and repeat 2.5 mg but remains in SVT. She is asymptomatic aside from her palpitations. W/u fairly unremarkable. Will discuss with cardiology.   Raeford Razor, MD 12/30/12 Wynetta Emery  Raeford Razor, MD 12/30/12 [phone]

## 2012-12-30 NOTE — ED Notes (Signed)
Pt c/o chest tightness just prior to arrival. Pt was at home for rehab and the nurse noticed a vein pulsating on the left side of her neck and called her MD. The doctor suggested she come to hospital. On the way here her chest started bothering her. Denies any other associated symptoms. Does have hx of Afib.

## 2012-12-30 NOTE — Telephone Encounter (Signed)
Pt's home health caregiver called and said pt is in Afib and that daughter is taking her to [facility] ED. They wanted to make sure you are aware. Thank you.

## 2012-12-30 NOTE — Telephone Encounter (Signed)
Thanks for the update - I will watch for notes on computer

## 2012-12-30 NOTE — Consult Note (Signed)
Cardiology Consult Note   Patient ID: [name] MRN: [id number], DOB/AGE: [birth year] 79 y.o. Date of Encounter: 12/30/2012  Primary Physician: [name], MD Primary Cardiologist: Fawn Kirk  Chief Complaint:  Tachycardia  HPI: [name] is a 79 y.o. female with no history of CAD. She has a history of SVT and has seen Dr Johney Frame. Ablation was considered but not done because of recent knee surgery and PE/DVT.    Yesterday, pt was in her usual state of health. She went to church and had also had physical therapy. After church last pm, she was fatigued but no other symptoms. Today, she had onset of tachy-palpitations. She did not otherwise have symptoms at that time. This pm, her daughter and the home health RN came over. They could see the rapid pulsations in her neck and checked her pulse. It was 158. She developed 2/10 chest pressure without other associated symptoms when en route to the ER where she was given Lopressor 2.5 mg IV. Her heart rate decreased from the 150s to the 130s but SVT persisted.    She recently underwent TKA at [facility], which was complicated by postoperative pulmonary embolism and SVT. She subsequently was evaluated by Dr. Johney Frame, who tentatively plans RFA, but wished to delay the procedure for at least 3 months following pulmonary embolism. She was in the emergency department 6 weeks ago with SVT after chronic beta blocker therapy had been discontinued. She subsequently was started on metoprolol 50 mg per day, but has had multiple episodes of self-limited palpitations and now a more prolonged episode. Despite continuation of SVT, all symptoms have now resolved.  Initial troponin is normal  Past Medical History  Diagnosis Date  . Osteopenia   . Hyperlipidemia   . Hypertension   . PSVT (paroxysmal supraventricular tachycardia)   . Spinal stenosis   . Overactive bladder     urge incontinence  . Pulmonary emboli   . Pulmonary hypertension   . Right ventricular  failure     Surgical History:  Past Surgical History  Procedure Laterality Date  . Cataract extraction  2008  . Bladder suspension  1993  . Joint replacement  2000    left TKR  . Total knee revision  01/12/2012    Procedure: TOTAL KNEE REVISION;  Surgeon: [name], MD;  Location: [facility] OR;  Service: Orthopedics;  Laterality: Left;  DEPUY/ LCS , HAND SET  . Trigger finger release  01/12/2012    Procedure: RELEASE TRIGGER FINGER/A-1 PULLEY;  Surgeon: [name], MD;  Location: [facility] OR;  Service: Orthopedics;  Laterality: Right;  . Cholecystectomy    . Hernia repair    . Total knee arthroplasty  10/29/2012    at [facility]   I have reviewed the patient's current medications. Prior to Admission medications   Medication Sig Start Date End Date Taking? Authorizing Provider  benzonatate (TESSALON) 200 MG capsule Take 1 capsule (200 mg total) by mouth 3 (three) times daily as needed for cough (swallow whole). 12/20/12  Yes Judy Pimple, MD  Calcium Carbonate-Vitamin D (CALCIUM-VITAMIN D) 600-200 MG-UNIT CAPS Take 1 capsule by mouth 3 (three) times daily.    Yes Historical Provider, MD  cholecalciferol (VITAMIN D) 1000 UNITS tablet Take 4,000 Units by mouth daily.   Yes Historical Provider, MD  metoprolol (LOPRESSOR) 50 MG tablet Take 1 tablet (50 mg total) by mouth daily. 12/23/12  Yes Judy Pimple, MD  Multiple Vitamins-Iron (MULTIVITAMIN/IRON PO) Take 1 tablet by  mouth daily.    Yes Historical Provider, MD  polyethylene glycol (MIRALAX / GLYCOLAX) packet Take 17 g by mouth daily as needed. For constipation   Yes Historical Provider, MD  Rivaroxaban (XARELTO) 20 MG TABS Take 20 mg by mouth daily. 12/08/12  Yes Hillis Range, MD   Allergies:  Allergies  Allergen Reactions  . Penicillins     REACTION: hives  . Sulfonamide Derivatives Hives   History   Social History  . Marital Status: Married    Spouse Name: N/A    Number of Children: N/A  . Years of Education: N/A   Occupational History    . Retired    Social History Main Topics  . Smoking status: Never Smoker   . Smokeless tobacco: Not on file  . Alcohol Use: No  . Drug Use: No  . Sexually Active: No   Other Topics Concern  . Not on file   Social History Narrative   Pt lives alone but dtr lives on property and has been staying with her since surgery.    Family History  Problem Relation Age of Onset  . Cancer Mother     breast CA  . Heart disease Father     MI  . Diabetes Father   . Hypertension Father   . Cancer Sister     uterine CA   Family Status  Relation Status Death Age  . Mother Deceased     breast CA  . Father Deceased 38    MI  . Sister Deceased     uterine CA   Review of Systems: She is struggling with her left leg. It is still in a brace. She is doing physical therapy. She has some LE edema. Full 14-point review of systems otherwise negative except as noted above.  Physical Exam: Blood pressure 108/82, pulse 134, temperature 97.8 F (36.6 C), temperature source Oral, resp. rate 33, SpO2 93.00%. General: Well developed, well nourished,female in no acute distress. Head: Normocephalic, atraumatic, sclera non-icteric, no xanthomas, nares are without discharge. Dentition: good  Neck: No carotid bruits. JVD not elevated. No thyromegaly; prominent left carotid pulsation Lungs: Good expansion bilaterally. without wheezes or rhonchi. Few rales Heart: Slightly Irregular rate and rhythm with S1 S2.  No S3 or S4.  No murmur, no rubs, or gallops appreciated. Abdomen: Soft, non-tender, non-distended with normoactive bowel sounds. No hepatomegaly. No rebound/guarding. No obvious abdominal masses. Msk:  Strength and tone appear slightly weak for her age. No joint deformities or effusions, no spine or costo-vertebral angle tenderness. LLE is in a brace and not disturbed. Extremities: No clubbing or cyanosis. Trace edema.  Distal pedal pulses are 2+ in 4 extrem Neuro: Alert and oriented X 3. Moves all  extremities spontaneously. No focal deficits noted. Psych:  Responds to questions appropriately with a normal affect. Skin: No rashes or lesions noted  Labs:   Lab Results  Component Value Date   WBC 6.2 12/30/2012   HGB 15.8* 12/30/2012   HCT 48.9* 12/30/2012   MCV 91.7 12/30/2012   PLT 238 12/30/2012    Recent Labs Lab 12/30/12 1715  NA 142  K 4.1  CL 101  CO2 31  BUN 24*  CREATININE 0.93  CALCIUM 9.9  GLUCOSE 110*    Recent Labs  12/30/12 1649 12/30/12 1732  TROPIPOC 0.01 0.02   Lab Results  Component Value Date   CHOL 189 08/18/2011   HDL 51.90 08/18/2011   LDLCALC 118* 08/18/2011   TRIG 94.0 08/18/2011  Pro B Natriuretic peptide (BNP)  Date/Time Value Range Status  12/07/2012  1:24 PM 751.0* 0.0 - 100.0 pg/mL Final   Lab Results  Component Value Date   DDIMER 5.74* 11/23/2012   Radiology/Studies: Dg Chest 2 View  12/30/2012  1.  No radiographic evidence of acute cardiopulmonary disease.  The appearance of chest is similar to prior examinations, as above. 2.  Mild cardiomegaly. 3.  Atherosclerosis.     Echo: 11/23/2012 Normal LV size, wall thickness and systolic function.  Diastolic and systolic septal flattening.   Moderate RV enlargement and mildly decreased systolic function.  Moderate to marked right atrial enlargement.  Severe tricuspid regurgitation. PA peak pressure: 82mm Hg (S)  ECG: 30-Dec-2012 SVT at a rate of 150 bpm and retrograde P wave immediately following the QRS; Right bundle branch block; possible anterior infarct , age undetermined  ASSESSMENT AND PLAN:  Active Problems:   PSVT - carotid sinus massage had no effect on rhythm; Adenosine 6 mg - no change. Adenosine 12 mg - pt converted to SR. EKG post conversion-resolution of ST segment depression and T-wave abnormalities. Will recheck enzymes and if negative, patient will be discharged from the emergency department. She is to change Lopressor from 50 mg daily to 25 mg BID. We will add Verapamil 120  mg per day and pt/dtr know they need to check her BP/HR if she has any symptoms. She is to keep her f/u appt with Dr Johney Frame as scheduled.   Maryagnes Amos 12/30/2012, 8:34 PM  Cardiology Attending Patient interviewed and examined. Discussed with Joni Reining, NP.  Above note annotated and modified based upon my findings.  Recurrent PSVT uncontrolled with low-dose metoprolol, inappropriately administered and once daily dosage, but already resulting in sinus bradycardia her rate of 55 at patient's last office visit. Verapamil will increase AV nodal block, but likely not further depress heart rate. Adequate blood pressure to tolerate a second antihypertensive medication. Patient apprised of potential adverse effects of adding verapamil and will call should any of these develop.  [place] Bing, MD 12/30/2012, 9:27 PM

## 2012-12-31 MED ORDER — CALCIUM CARBONATE ANTACID 500 MG PO CHEW
1.0000 | CHEWABLE_TABLET | Freq: Once | ORAL | Status: DC
  Filled 2012-12-31: qty 1

## 2012-12-31 NOTE — ED Notes (Signed)
Patient is alert and orientedx4.  Patient was explained discharge instructions and they understood them with no questions.  Patient is being taken home by [facility].

## 2013-01-02 ENCOUNTER — Other Ambulatory Visit: Payer: Self-pay | Admitting: Family Medicine

## 2013-01-03 NOTE — ED Provider Notes (Signed)
I saw and evaluated the patient, reviewed the resident's note and I agree with the findings and plan.  Please see completed note.  Raeford Razor, MD 01/03/13 [phone]

## 2013-01-19 ENCOUNTER — Telehealth: Payer: Self-pay

## 2013-01-19 NOTE — Telephone Encounter (Signed)
Pt left v/m that she has an order for oxygen that she has not been using for several months and request order to [facility] to d/c oxygen.Please advise.

## 2013-01-19 NOTE — Telephone Encounter (Signed)
Please give order to D/C her 02, thanks

## 2013-01-20 NOTE — Telephone Encounter (Signed)
Orders sent to [facility] to d/c pt's O2, and pt notified

## 2013-01-26 ENCOUNTER — Telehealth: Payer: Self-pay | Admitting: Family Medicine

## 2013-01-26 DIAGNOSIS — Z0000 Encounter for general adult medical examination without abnormal findings: Secondary | ICD-10-CM

## 2013-01-26 DIAGNOSIS — E7800 Pure hypercholesterolemia, unspecified: Secondary | ICD-10-CM

## 2013-01-26 DIAGNOSIS — I10 Essential (primary) hypertension: Secondary | ICD-10-CM

## 2013-01-26 DIAGNOSIS — M899 Disorder of bone, unspecified: Secondary | ICD-10-CM

## 2013-01-26 DIAGNOSIS — E876 Hypokalemia: Secondary | ICD-10-CM

## 2013-01-26 NOTE — Telephone Encounter (Signed)
Message copied by Judy Pimple on Wed Jan 26, 2013  8:32 AM ------      Message from: Alvina Chou      Created: Fri Jan 21, 2013 10:52 AM      Regarding: Lab orders for Tuesday, 4.8.14       Patient is scheduled for CPX labs, please order future labs, Thanks , [name]       ------

## 2013-01-31 ENCOUNTER — Ambulatory Visit (INDEPENDENT_AMBULATORY_CARE_PROVIDER_SITE_OTHER): Payer: Medicare Other | Admitting: Internal Medicine

## 2013-01-31 ENCOUNTER — Encounter: Payer: Medicare Other | Admitting: Internal Medicine

## 2013-01-31 ENCOUNTER — Encounter: Payer: Self-pay | Admitting: Internal Medicine

## 2013-01-31 VITALS — BP 153/69 | HR 55 | Ht 66.0 in | Wt 194.8 lb

## 2013-01-31 DIAGNOSIS — I2699 Other pulmonary embolism without acute cor pulmonale: Secondary | ICD-10-CM

## 2013-01-31 DIAGNOSIS — I471 Supraventricular tachycardia, unspecified: Secondary | ICD-10-CM

## 2013-01-31 DIAGNOSIS — I272 Pulmonary hypertension, unspecified: Secondary | ICD-10-CM

## 2013-01-31 DIAGNOSIS — I2789 Other specified pulmonary heart diseases: Secondary | ICD-10-CM

## 2013-01-31 NOTE — Progress Notes (Signed)
PCP:  [name], MD  The patient presents today for cardiology followup after her recent hospitalization for PTE during which she had SVT.  Since her hospital discharge, she has done reasonably well.  Her SOB is much improved and she is off of O2.  Her edema is better.  She has had only 1 episode of sustained SVT.  Today, she denies symptoms of chest pain,orthopnea, PND, dizziness, presyncope, syncope, or neurologic sequela.  The patient feels that she is tolerating medications without difficulties and is otherwise without complaint today.   Past Medical History  Diagnosis Date  . Osteopenia   . Hyperlipidemia   . Hypertension   . PSVT (paroxysmal supraventricular tachycardia)   . Spinal stenosis   . Overactive bladder     urge incontinence  . Pulmonary emboli   . Pulmonary hypertension   . Right ventricular failure    Past Surgical History  Procedure Laterality Date  . Cataract extraction  2008  . Bladder suspension  1993  . Joint replacement  2000    left TKR  . Total knee revision  01/12/2012    Procedure: TOTAL KNEE REVISION;  Surgeon: [name], MD;  Location: [facility] OR;  Service: Orthopedics;  Laterality: Left;  DEPUY/ LCS , HAND SET  . Trigger finger release  01/12/2012    Procedure: RELEASE TRIGGER FINGER/A-1 PULLEY;  Surgeon: [name], MD;  Location: [facility] OR;  Service: Orthopedics;  Laterality: Right;  . Cholecystectomy    . Hernia repair    . Total knee arthroplasty  10/29/2012    at [facility]    Current Outpatient Prescriptions  Medication Sig Dispense Refill  . benzonatate (TESSALON) 200 MG capsule Take 1 capsule (200 mg total) by mouth 3 (three) times daily as needed for cough (swallow whole).  30 capsule  2  . Calcium Carbonate-Vitamin D (CALCIUM-VITAMIN D) 600-200 MG-UNIT CAPS Take 1 capsule by mouth 3 (three) times daily.       . cholecalciferol (VITAMIN D) 1000 UNITS tablet Take 4,000 Units by mouth daily.      . metoprolol (LOPRESSOR) 50 MG tablet Take 0.5  tablets (25 mg total) by mouth daily.  30 tablet  5  . Multiple Vitamins-Iron (MULTIVITAMIN/IRON PO) Take 1 tablet by mouth daily.       . polyethylene glycol (MIRALAX / GLYCOLAX) packet Take 17 g by mouth daily as needed. For constipation      . Rivaroxaban (XARELTO) 20 MG TABS Take 20 mg by mouth daily.      . verapamil (CALAN-SR) 120 MG CR tablet Take 1 tablet (120 mg total) by mouth at bedtime.  30 tablet  3   No current facility-administered medications for this visit.    Allergies  Allergen Reactions  . Penicillins     REACTION: hives  . Sulfonamide Derivatives Hives    History   Social History  . Marital Status: Married    Spouse Name: N/A    Number of Children: N/A  . Years of Education: N/A   Occupational History  . Retired    Social History Main Topics  . Smoking status: Never Smoker   . Smokeless tobacco: Not on file  . Alcohol Use: No  . Drug Use: No  . Sexually Active: No   Other Topics Concern  . Not on file   Social History Narrative   Pt lives alone but dtr lives on property and has been staying with her since surgery.  Family History  Problem Relation Age of Onset  . Cancer Mother     breast CA  . Heart disease Father     MI  . Diabetes Father   . Hypertension Father   . Cancer Sister     uterine CA    ROS-  All systems are reviewed and are negative except as outlined in the HPI above   Physical Exam: Filed Vitals:   01/31/13 1007  BP: 153/69  Pulse: 55  Height: 5\' 6"  (1.676 m)  Weight: 194 lb 12.8 oz (88.361 kg)    GEN- The patient is elderly appearing, alert and oriented x 3 today.   Head- normocephalic, atraumatic Eyes-  Sclera clear, conjunctiva pink Ears- hearing intact Oropharynx- clear Neck- supple, JVP 9cm Lungs- Clear to ausculation bilaterally, normal work of breathing Heart- Regular rate and rhythm, no murmurs, rubs or gallops, PMI not laterally displaced GI- soft, NT, ND, + BS Extremities- no clubbing, cyanosis,  edema is resolved MS- no significant deformity or atrophy Skin- no rash or lesion Psych- euthymic mood, full affect Neuro- strength and sensation are intact  Assessment and Plan:

## 2013-01-31 NOTE — Assessment & Plan Note (Signed)
Clinically improved with medical management of PTE Repeat echo upon return in 3 months

## 2013-01-31 NOTE — Assessment & Plan Note (Signed)
She prefers to continue medical therapy for now She will consider ablation if her SVT continues to be an issue

## 2013-01-31 NOTE — Assessment & Plan Note (Signed)
On xarelto 

## 2013-01-31 NOTE — Patient Instructions (Addendum)
Your physician recommends that you schedule a follow-up appointment in 3 months with Dr Allred    

## 2013-02-01 ENCOUNTER — Other Ambulatory Visit (INDEPENDENT_AMBULATORY_CARE_PROVIDER_SITE_OTHER): Payer: Medicare Other

## 2013-02-01 DIAGNOSIS — E7800 Pure hypercholesterolemia, unspecified: Secondary | ICD-10-CM

## 2013-02-01 DIAGNOSIS — I10 Essential (primary) hypertension: Secondary | ICD-10-CM

## 2013-02-01 DIAGNOSIS — M899 Disorder of bone, unspecified: Secondary | ICD-10-CM

## 2013-02-01 DIAGNOSIS — E876 Hypokalemia: Secondary | ICD-10-CM

## 2013-02-01 LAB — CBC WITH DIFFERENTIAL/PLATELET
Basophils Relative: 0.6 % (ref 0.0–3.0)
Eosinophils Absolute: 0.2 10*3/uL (ref 0.0–0.7)
MCHC: 33.5 g/dL (ref 30.0–36.0)
MCV: 88.6 fl (ref 78.0–100.0)
Monocytes Absolute: 0.6 10*3/uL (ref 0.1–1.0)
Neutrophils Relative %: 54 % (ref 43.0–77.0)
RBC: 4.74 Mil/uL (ref 3.87–5.11)
RDW: 14.7 % — ABNORMAL HIGH (ref 11.5–14.6)

## 2013-02-01 LAB — LIPID PANEL
HDL: 49.1 mg/dL (ref >39.00)
LDL Cholesterol: 86 mg/dL (ref 0–99)
Total CHOL/HDL Ratio: 3
Triglycerides: 90 mg/dL (ref 0.0–149.0)

## 2013-02-01 LAB — COMPREHENSIVE METABOLIC PANEL
AST: 24 U/L (ref 0–37)
Alkaline Phosphatase: 64 U/L (ref 39–117)
BUN: 18 mg/dL (ref 6–23)
Creatinine, Ser: 0.9 mg/dL (ref 0.4–1.2)
Potassium: 4 mEq/L (ref 3.5–5.1)

## 2013-02-02 LAB — VITAMIN D 25 HYDROXY (VIT D DEFICIENCY, FRACTURES): Vit D, 25-Hydroxy: 75 ng/mL (ref 30–89)

## 2013-02-08 ENCOUNTER — Encounter: Payer: Self-pay | Admitting: Family Medicine

## 2013-02-08 ENCOUNTER — Ambulatory Visit (INDEPENDENT_AMBULATORY_CARE_PROVIDER_SITE_OTHER): Payer: Medicare Other | Admitting: Family Medicine

## 2013-02-08 VITALS — BP 132/84 | HR 57 | Temp 98.5°F | Ht 66.0 in | Wt 190.8 lb

## 2013-02-08 DIAGNOSIS — I10 Essential (primary) hypertension: Secondary | ICD-10-CM

## 2013-02-08 DIAGNOSIS — M899 Disorder of bone, unspecified: Secondary | ICD-10-CM

## 2013-02-08 DIAGNOSIS — M949 Disorder of cartilage, unspecified: Secondary | ICD-10-CM

## 2013-02-08 DIAGNOSIS — E7800 Pure hypercholesterolemia, unspecified: Secondary | ICD-10-CM

## 2013-02-08 DIAGNOSIS — Z0000 Encounter for general adult medical examination without abnormal findings: Secondary | ICD-10-CM

## 2013-02-08 NOTE — Assessment & Plan Note (Signed)
Disc goals for lipids and reasons to control them Rev labs with pt Rev low sat fat diet in detail   

## 2013-02-08 NOTE — Assessment & Plan Note (Signed)
Last dexa improved 2013 in nl limits Disc exercise/ calcium/ D and fx risk/ safety No fragility fractures-this is reassuring

## 2013-02-08 NOTE — Assessment & Plan Note (Signed)
Reviewed health habits including diet and exercise and skin cancer prevention Also reviewed health mt list, fam hx and immunizations  See HPI for intake  Rev wellness labs in detail Pt will make her own annual mammogram appt

## 2013-02-08 NOTE — Assessment & Plan Note (Signed)
bp in fair control at this time  No changes needed  Disc lifstyle change with low sodium diet and exercise  Labs reviewed  

## 2013-02-08 NOTE — Patient Instructions (Addendum)
Keep taking good care of yourself Blood pressure and labs look ok  Think about updating your living will  Mammogram will be due at the end of august-you can make your own appt

## 2013-02-08 NOTE — Progress Notes (Signed)
Subjective:    Patient ID: [name], female    DOB: 1934-02-23, 79 y.o.   MRN: [id number]  HPI I have personally reviewed the Medicare Annual Wellness questionnaire and have noted 1. The patient's medical and social history 2. Their use of alcohol, tobacco or illicit drugs 3. Their current medications and supplements 4. The patient's functional ability including ADL's, fall risks, home safety risks and hearing or visual             impairment. 5. Diet and physical activities 6. Evidence for depression or mood disorders  She is doing really well overall  Hopes to get rid of her brace today  Will still have to do PT , though feeling good   The patients weight, height, BMI have been recorded in the chart and visual acuity is per eye clinic.  I have made referrals, counseling and provided education to the patient based review of the above and I have provided the pt with a written personalized care plan for preventive services.  See scanned forms.  Routine anticipatory guidance given to patient.  See health maintenance. Flu vaccine- got that this fall  Shingles had vaccine 1/09 PNA vaccine 1011 Tetanus Td 4/13 Colon ca screen- colonosc was 9/03 recall- did not give her a recall - she does not want to have another one  Breast cancer screening- mammogram 8/13  Self exam-no lumps or changes (pos family history)  Last pap 09-no gyn symptoms at all Advance directive- has a living will (needs to have that re done - and she will designate Clydie Braun as her POA) Cognitive function addressed- see scanned forms- and if abnormal then additional documentation follows.  No problems with memory or concentration -no real changes   PMH and SH reviewed  Meds, vitals, and allergies reviewed.   Falls none at all- is very careful and balance is ok   Mood -is overall good / stays motivated and no depression   dexa 10/13 normal range D level is 75 Getting more exercise since knee surgery   bp is  stable today  No cp or palpitations or headaches or edema  No side effects to medicines  BP Readings from Last 3 Encounters:  02/08/13 132/84  01/31/13 153/69  12/31/12 123/64    Cardiology follow up was fine - finally situated  Lab Results  Component Value Date   CHOL 153 02/01/2013   CHOL 189 08/18/2011   CHOL 168 08/12/2010   Lab Results  Component Value Date   HDL 49.10 02/01/2013   HDL 51.90 08/18/2011   HDL 46.40 08/12/2010   Lab Results  Component Value Date   LDLCALC 86 02/01/2013   LDLCALC 118* 08/18/2011   LDLCALC 100* 08/12/2010   Lab Results  Component Value Date   TRIG 90.0 02/01/2013   TRIG 94.0 08/18/2011   TRIG 107.0 08/12/2010   Lab Results  Component Value Date   CHOLHDL 3 02/01/2013   CHOLHDL 4 08/18/2011   CHOLHDL 4 08/12/2010   No results found for this basename: LDLDIRECT   very good - improved/ pt is careful with what she eats   Mother had breast ca Sister had uterine ca  Patient Active Problem List  Diagnosis  . HYPERCHOLESTEROLEMIA, PURE  . HYPOKALEMIA  . DECREASED WHITE BLOOD CELL COUNT NOS  . HYPERTENSION, ESSENTIAL NOS  . PSVT  . SCLERODERMA  . DEGENERATIVE JOINT DISEASE  . SPINAL STENOSIS, LUMBAR  . OSTEOPENIA  . PES PLANUS  . SPONDYLOLISTHESIS  .  URINARY FREQUENCY, CHRONIC  . Painful total knee replacement, dislocating patella  . Dizziness  . Indigestion  . PE (pulmonary embolism)  . DVT (deep venous thrombosis)  . Pulmonary hypertension, moderate to severe  . Pneumonia  . Pedal edema  . Dyspnea and respiratory abnormality  . Right ventricular failure  . Encounter for Medicare annual wellness exam   Past Medical History  Diagnosis Date  . Osteopenia   . Hyperlipidemia   . Hypertension   . PSVT (paroxysmal supraventricular tachycardia)   . Spinal stenosis   . Overactive bladder     urge incontinence  . Pulmonary emboli   . Pulmonary hypertension   . Right ventricular failure    Past Surgical History  Procedure  Laterality Date  . Cataract extraction  2008  . Bladder suspension  1993  . Joint replacement  2000    left TKR  . Total knee revision  01/12/2012    Procedure: TOTAL KNEE REVISION;  Surgeon: [name], MD;  Location: [facility] OR;  Service: Orthopedics;  Laterality: Left;  DEPUY/ LCS , HAND SET  . Trigger finger release  01/12/2012    Procedure: RELEASE TRIGGER FINGER/A-1 PULLEY;  Surgeon: [name], MD;  Location: [facility] OR;  Service: Orthopedics;  Laterality: Right;  . Cholecystectomy    . Hernia repair    . Total knee arthroplasty  10/29/2012    at [facility]   History  Substance Use Topics  . Smoking status: Never Smoker   . Smokeless tobacco: Not on file  . Alcohol Use: No   Family History  Problem Relation Age of Onset  . Cancer Mother     breast CA  . Heart disease Father     MI  . Diabetes Father   . Hypertension Father   . Cancer Sister     uterine CA   Allergies  Allergen Reactions  . Penicillins     REACTION: hives  . Sulfonamide Derivatives Hives   Current Outpatient Prescriptions on File Prior to Visit  Medication Sig Dispense Refill  . benzonatate (TESSALON) 200 MG capsule Take 1 capsule (200 mg total) by mouth 3 (three) times daily as needed for cough (swallow whole).  30 capsule  2  . Calcium Carbonate-Vitamin D (CALCIUM-VITAMIN D) 600-200 MG-UNIT CAPS Take 1 capsule by mouth 3 (three) times daily.       . cholecalciferol (VITAMIN D) 1000 UNITS tablet Take 4,000 Units by mouth daily.      . metoprolol (LOPRESSOR) 50 MG tablet Take 0.5 tablets (25 mg total) by mouth daily.  30 tablet  5  . Multiple Vitamins-Iron (MULTIVITAMIN/IRON PO) Take 1 tablet by mouth daily.       . polyethylene glycol (MIRALAX / GLYCOLAX) packet Take 17 g by mouth daily as needed. For constipation      . Rivaroxaban (XARELTO) 20 MG TABS Take 20 mg by mouth daily.      . verapamil (CALAN-SR) 120 MG CR tablet Take 1 tablet (120 mg total) by mouth at bedtime.  30 tablet  3   No current  facility-administered medications on file prior to visit.     Review of Systems    Review of Systems  Constitutional: Negative for fever, appetite change, fatigue and unexpected weight change.  Eyes: Negative for pain and visual disturbance.  Respiratory: Negative for cough and shortness of breath.   Cardiovascular: Negative for cp or palpitations    Gastrointestinal: Negative for nausea, diarrhea and constipation.  Genitourinary: Negative  for urgency and frequency.  Skin: Negative for pallor or rash   Neurological: Negative for weakness, light-headedness, numbness and headaches.  Hematological: Negative for adenopathy. Does not bruise/bleed easily.  Psychiatric/Behavioral: Negative for dysphoric mood. The patient is not nervous/anxious.      Objective:   Physical Exam  Constitutional: She is oriented to person, place, and time. She appears well-developed and well-nourished. No distress.  overwt and well appearing   HENT:  Head: Normocephalic and atraumatic.  Right Ear: External ear normal.  Left Ear: External ear normal.  Nose: Nose normal.  Mouth/Throat: Oropharynx is clear and moist.  Eyes: Conjunctivae and EOM are normal. Pupils are equal, round, and reactive to light. Right eye exhibits no discharge. Left eye exhibits no discharge. No scleral icterus.  Neck: Normal range of motion. Neck supple. No JVD present. Carotid bruit is not present. No thyromegaly present.  Cardiovascular: Normal rate, regular rhythm, normal heart sounds and intact distal pulses.  Exam reveals no gallop.   Pulmonary/Chest: Effort normal and breath sounds normal. No respiratory distress. She has no wheezes. She has no rales.  Abdominal: Soft. Bowel sounds are normal. She exhibits no distension and no mass. There is no tenderness.  Genitourinary: No breast swelling, tenderness, discharge or bleeding.  Breast exam: No mass, nodules, thickening, tenderness, bulging, retraction, inflamation, nipple discharge  or skin changes noted.  No axillary or clavicular LA.  Chaperoned exam.    Musculoskeletal: Normal range of motion. She exhibits no edema and no tenderness.  Healed scar noted from knee surgery Pt ambulates well   Lymphadenopathy:    She has no cervical adenopathy.  Neurological: She is alert and oriented to person, place, and time. She has normal reflexes. No cranial nerve deficit. She exhibits normal muscle tone. Coordination normal.  Skin: Skin is warm and dry. No rash noted. No erythema. No pallor.  Psychiatric: She has a normal mood and affect.          Assessment & Plan:

## 2013-02-18 ENCOUNTER — Encounter: Payer: Self-pay | Admitting: Internal Medicine

## 2013-04-19 ENCOUNTER — Emergency Department (HOSPITAL_COMMUNITY): Payer: Medicare Other

## 2013-04-19 ENCOUNTER — Encounter (HOSPITAL_COMMUNITY): Payer: Self-pay | Admitting: Emergency Medicine

## 2013-04-19 ENCOUNTER — Emergency Department (HOSPITAL_COMMUNITY)
Admission: EM | Admit: 2013-04-19 | Discharge: 2013-04-19 | Disposition: A | Discharge status: Home / Self Care | Payer: Medicare Other | Attending: Emergency Medicine | Admitting: Emergency Medicine

## 2013-04-19 DIAGNOSIS — Z8679 Personal history of other diseases of the circulatory system: Secondary | ICD-10-CM | POA: Insufficient documentation

## 2013-04-19 DIAGNOSIS — S20219A Contusion of unspecified front wall of thorax, initial encounter: Secondary | ICD-10-CM

## 2013-04-19 DIAGNOSIS — Z86718 Personal history of other venous thrombosis and embolism: Secondary | ICD-10-CM | POA: Insufficient documentation

## 2013-04-19 DIAGNOSIS — I10 Essential (primary) hypertension: Secondary | ICD-10-CM | POA: Insufficient documentation

## 2013-04-19 DIAGNOSIS — E785 Hyperlipidemia, unspecified: Secondary | ICD-10-CM | POA: Insufficient documentation

## 2013-04-19 DIAGNOSIS — Y9389 Activity, other specified: Secondary | ICD-10-CM | POA: Insufficient documentation

## 2013-04-19 DIAGNOSIS — Z8739 Personal history of other diseases of the musculoskeletal system and connective tissue: Secondary | ICD-10-CM | POA: Insufficient documentation

## 2013-04-19 DIAGNOSIS — Z87448 Personal history of other diseases of urinary system: Secondary | ICD-10-CM | POA: Insufficient documentation

## 2013-04-19 DIAGNOSIS — Y92410 Unspecified street and highway as the place of occurrence of the external cause: Secondary | ICD-10-CM | POA: Insufficient documentation

## 2013-04-19 DIAGNOSIS — Z79899 Other long term (current) drug therapy: Secondary | ICD-10-CM | POA: Insufficient documentation

## 2013-04-19 MED ORDER — HYDROCODONE-ACETAMINOPHEN 5-325 MG PO TABS
2.0000 | ORAL_TABLET | Freq: Once | ORAL | Status: AC
  Administered 2013-04-19: 2 via ORAL
  Filled 2013-04-19: qty 2

## 2013-04-19 MED ORDER — HYDROCODONE-ACETAMINOPHEN 5-325 MG PO TABS: 1.0000 | ORAL_TABLET | ORAL | Status: DC | PRN
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
## 2013-04-19 NOTE — ED Notes (Signed)
Pt in XR. [facility] in room

## 2013-04-19 NOTE — ED Notes (Signed)
Pt back from XR 

## 2013-04-19 NOTE — ED Notes (Signed)
NAD noted at time of d/c home with family 

## 2013-04-19 NOTE — ED Notes (Signed)
Patient transported to X-ray 

## 2013-04-21 NOTE — ED Provider Notes (Signed)
I saw and evaluated the patient, reviewed the resident's note and I agree with the findings and plan.  Patient denies any head injury whatsoever, cervical spine nontender  Hurman Horn, MD 04/21/13 [phone]

## 2013-04-25 ENCOUNTER — Emergency Department (HOSPITAL_COMMUNITY): Payer: Medicare Other

## 2013-04-25 ENCOUNTER — Observation Stay (HOSPITAL_COMMUNITY)
Admission: EM | Admit: 2013-04-25 | Discharge: 2013-04-26 | DRG: 185 | Disposition: A | Discharge status: Home / Self Care | Payer: Medicare Other | Attending: Internal Medicine | Admitting: Internal Medicine

## 2013-04-25 ENCOUNTER — Telehealth: Payer: Self-pay

## 2013-04-25 ENCOUNTER — Encounter (HOSPITAL_COMMUNITY): Payer: Self-pay | Admitting: Nurse Practitioner

## 2013-04-25 DIAGNOSIS — Z96659 Presence of unspecified artificial knee joint: Secondary | ICD-10-CM

## 2013-04-25 DIAGNOSIS — S2220XA Unspecified fracture of sternum, initial encounter for closed fracture: Principal | ICD-10-CM | POA: Diagnosis present

## 2013-04-25 DIAGNOSIS — Z79899 Other long term (current) drug therapy: Secondary | ICD-10-CM

## 2013-04-25 DIAGNOSIS — M949 Disorder of cartilage, unspecified: Secondary | ICD-10-CM | POA: Diagnosis present

## 2013-04-25 DIAGNOSIS — I272 Pulmonary hypertension, unspecified: Secondary | ICD-10-CM | POA: Diagnosis present

## 2013-04-25 DIAGNOSIS — Z880 Allergy status to penicillin: Secondary | ICD-10-CM

## 2013-04-25 DIAGNOSIS — M899 Disorder of bone, unspecified: Secondary | ICD-10-CM | POA: Diagnosis present

## 2013-04-25 DIAGNOSIS — R0902 Hypoxemia: Secondary | ICD-10-CM | POA: Diagnosis present

## 2013-04-25 DIAGNOSIS — I10 Essential (primary) hypertension: Secondary | ICD-10-CM | POA: Diagnosis present

## 2013-04-25 DIAGNOSIS — I82409 Acute embolism and thrombosis of unspecified deep veins of unspecified lower extremity: Secondary | ICD-10-CM

## 2013-04-25 DIAGNOSIS — N318 Other neuromuscular dysfunction of bladder: Secondary | ICD-10-CM | POA: Diagnosis present

## 2013-04-25 DIAGNOSIS — R911 Solitary pulmonary nodule: Secondary | ICD-10-CM | POA: Diagnosis present

## 2013-04-25 DIAGNOSIS — Z86711 Personal history of pulmonary embolism: Secondary | ICD-10-CM

## 2013-04-25 DIAGNOSIS — I2609 Other pulmonary embolism with acute cor pulmonale: Secondary | ICD-10-CM

## 2013-04-25 DIAGNOSIS — M4800 Spinal stenosis, site unspecified: Secondary | ICD-10-CM | POA: Diagnosis present

## 2013-04-25 DIAGNOSIS — I2789 Other specified pulmonary heart diseases: Secondary | ICD-10-CM | POA: Diagnosis present

## 2013-04-25 DIAGNOSIS — E785 Hyperlipidemia, unspecified: Secondary | ICD-10-CM | POA: Diagnosis present

## 2013-04-25 DIAGNOSIS — R0789 Other chest pain: Secondary | ICD-10-CM | POA: Diagnosis present

## 2013-04-25 DIAGNOSIS — Z882 Allergy status to sulfonamides status: Secondary | ICD-10-CM

## 2013-04-25 LAB — BASIC METABOLIC PANEL
CO2: 31 mEq/L (ref 19–32)
Calcium: 9 mg/dL (ref 8.4–10.5)
Chloride: 103 mEq/L (ref 96–112)
Glucose, Bld: 89 mg/dL (ref 70–99)
Potassium: 3.9 mEq/L (ref 3.5–5.1)
Sodium: 140 mEq/L (ref 135–145)

## 2013-04-25 LAB — PRO B NATRIURETIC PEPTIDE: Pro B Natriuretic peptide (BNP): 571.4 pg/mL — ABNORMAL HIGH (ref 0–450)

## 2013-04-25 LAB — CBC
Hemoglobin: 13.6 g/dL (ref 12.0–15.0)
MCH: 30.9 pg (ref 26.0–34.0)
Platelets: 205 10*3/uL (ref 150–400)
RBC: 4.4 MIL/uL (ref 3.87–5.11)
WBC: 5.4 10*3/uL (ref 4.0–10.5)

## 2013-04-25 LAB — POCT I-STAT TROPONIN I: Troponin i, poc: 0.01 ng/mL (ref 0.00–0.08)

## 2013-04-25 MED ORDER — IOHEXOL 350 MG/ML SOLN
100.0000 mL | Freq: Once | INTRAVENOUS | Status: AC | PRN
  Administered 2013-04-25: 100 mL via INTRAVENOUS

## 2013-04-25 NOTE — ED Notes (Signed)
Patient transported to CT 

## 2013-04-25 NOTE — Telephone Encounter (Signed)
Spoke with pt and advised Dr Milinda Antis concerned about possible PE or DVT and pt will go to [facility] ED now for eval.
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
## 2013-04-25 NOTE — ED Notes (Signed)
Sats dropped to 84% while ambulating on RA
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
## 2013-04-26 ENCOUNTER — Encounter (HOSPITAL_COMMUNITY): Payer: Self-pay | Admitting: Internal Medicine

## 2013-04-26 DIAGNOSIS — R0902 Hypoxemia: Secondary | ICD-10-CM

## 2013-04-26 DIAGNOSIS — R0789 Other chest pain: Secondary | ICD-10-CM | POA: Diagnosis present

## 2013-04-26 DIAGNOSIS — I2609 Other pulmonary embolism with acute cor pulmonale: Secondary | ICD-10-CM

## 2013-04-26 DIAGNOSIS — S2220XA Unspecified fracture of sternum, initial encounter for closed fracture: Secondary | ICD-10-CM

## 2013-04-26 LAB — BASIC METABOLIC PANEL
CO2: 31 mEq/L (ref 19–32)
Calcium: 8.4 mg/dL (ref 8.4–10.5)
Chloride: 104 mEq/L (ref 96–112)
Glucose, Bld: 90 mg/dL (ref 70–99)
Potassium: 3.7 mEq/L (ref 3.5–5.1)
Sodium: 140 mEq/L (ref 135–145)

## 2013-04-26 LAB — CBC
HCT: 41 % (ref 36.0–46.0)
Hemoglobin: 12.9 g/dL (ref 12.0–15.0)
MCH: 29.7 pg (ref 26.0–34.0)
MCV: 94.5 fL (ref 78.0–100.0)
Platelets: 195 10*3/uL (ref 150–400)
RBC: 4.34 MIL/uL (ref 3.87–5.11)
WBC: 5.4 10*3/uL (ref 4.0–10.5)

## 2013-04-26 LAB — TSH: TSH: 5.696 u[IU]/mL — ABNORMAL HIGH (ref 0.350–4.500)

## 2013-04-26 MED ORDER — RIVAROXABAN 20 MG PO TABS
20.0000 mg | ORAL_TABLET | Freq: Every day | ORAL | Status: DC
  Filled 2013-04-26: qty 1

## 2013-04-26 MED ORDER — ACETAMINOPHEN 650 MG RE SUPP: 650.0000 mg | Freq: Four times a day (QID) | RECTAL | Status: DC | PRN

## 2013-04-26 MED ORDER — ACETAMINOPHEN 325 MG PO TABS: 650.0000 mg | ORAL_TABLET | Freq: Four times a day (QID) | ORAL | Status: DC | PRN

## 2013-04-26 MED ORDER — ONDANSETRON HCL 4 MG/2ML IJ SOLN: 4.0000 mg | Freq: Four times a day (QID) | INTRAMUSCULAR | Status: DC | PRN

## 2013-04-26 MED ORDER — SODIUM CHLORIDE 0.9 % IJ SOLN
3.0000 mL | Freq: Two times a day (BID) | INTRAMUSCULAR | Status: DC
  Administered 2013-04-26 (×2): 3 mL via INTRAVENOUS

## 2013-04-26 MED ORDER — ONDANSETRON HCL 4 MG PO TABS: 4.0000 mg | ORAL_TABLET | Freq: Four times a day (QID) | ORAL | Status: DC | PRN

## 2013-04-26 MED ORDER — SODIUM CHLORIDE 0.9 % IJ SOLN
3.0000 mL | Freq: Two times a day (BID) | INTRAMUSCULAR | Status: DC
  Administered 2013-04-26: 3 mL via INTRAVENOUS

## 2013-04-26 MED ORDER — VITAMIN D3 25 MCG (1000 UNIT) PO TABS
1000.0000 [IU] | ORAL_TABLET | Freq: Two times a day (BID) | ORAL | Status: DC
  Administered 2013-04-26: 1000 [IU] via ORAL
  Filled 2013-04-26 (×3): qty 1

## 2013-04-26 MED ORDER — POLYETHYLENE GLYCOL 3350 17 G PO PACK: 17.0000 g | PACK | Freq: Every day | ORAL | Status: DC | PRN

## 2013-04-26 MED ORDER — METOPROLOL TARTRATE 25 MG PO TABS
25.0000 mg | ORAL_TABLET | Freq: Every day | ORAL | Status: DC
  Administered 2013-04-26: 25 mg via ORAL
  Filled 2013-04-26: qty 1

## 2013-04-26 MED ORDER — CALCIUM CARBONATE-VITAMIN D 500-200 MG-UNIT PO TABS
1.0000 | ORAL_TABLET | Freq: Three times a day (TID) | ORAL | Status: DC
  Administered 2013-04-26 (×2): 1 via ORAL
  Filled 2013-04-26 (×4): qty 1

## 2013-04-26 MED ORDER — VERAPAMIL HCL ER 120 MG PO TBCR
120.0000 mg | EXTENDED_RELEASE_TABLET | Freq: Every day | ORAL | Status: DC
  Administered 2013-04-26: 120 mg via ORAL
  Filled 2013-04-26 (×2): qty 1

## 2013-04-26 MED ORDER — BENZONATATE 100 MG PO CAPS
200.0000 mg | ORAL_CAPSULE | Freq: Three times a day (TID) | ORAL | Status: DC | PRN
  Filled 2013-04-26: qty 2

## 2013-04-26 MED ORDER — HYDROCODONE-ACETAMINOPHEN 5-325 MG PO TABS
1.0000 | ORAL_TABLET | ORAL | Status: DC | PRN
  Administered 2013-04-26: 1 via ORAL
  Filled 2013-04-26: qty 1

## 2013-04-26 MED ORDER — CALCIUM-VITAMIN D 600-200 MG-UNIT PO CAPS: 1.0000 | ORAL_CAPSULE | Freq: Three times a day (TID) | ORAL | Status: DC
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
## 2013-04-26 NOTE — Progress Notes (Signed)
UR COMPLETED  
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
## 2013-04-26 NOTE — Progress Notes (Signed)
Patient provided with discharge information and follow up appointment. She is going home with family this afternoon.

## 2013-04-28 ENCOUNTER — Other Ambulatory Visit: Payer: Self-pay | Admitting: *Deleted

## 2013-04-28 MED ORDER — VERAPAMIL HCL ER 120 MG PO TBCR: 120.0000 mg | EXTENDED_RELEASE_TABLET | Freq: Every day | ORAL | Status: DC

## 2013-05-02 ENCOUNTER — Ambulatory Visit (INDEPENDENT_AMBULATORY_CARE_PROVIDER_SITE_OTHER): Payer: Medicare Other | Admitting: Internal Medicine

## 2013-05-02 ENCOUNTER — Encounter: Payer: Self-pay | Admitting: Internal Medicine

## 2013-05-02 VITALS — BP 120/66 | HR 50 | Ht 66.0 in | Wt 193.2 lb

## 2013-05-02 DIAGNOSIS — I2789 Other specified pulmonary heart diseases: Secondary | ICD-10-CM

## 2013-05-02 DIAGNOSIS — I272 Pulmonary hypertension, unspecified: Secondary | ICD-10-CM

## 2013-05-02 NOTE — Patient Instructions (Signed)
Your physician recommends that you schedule a follow-up appointment in:  3 MONTHS WITH SCOTT WEAVER  AND 6 MONTHS WITH DR Johney Frame  Your physician recommends that you continue on your current medications as directed. Please refer to the Current Medication list given to you today.  Your physician has requested that you have an echocardiogram. Echocardiography is a painless test that uses sound waves to create images of your heart. It provides your doctor with information about the size and shape of your heart and how well your heart's chambers and valves are working. This procedure takes approximately one hour. There are no restrictions for this procedure.
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
## 2013-05-03 ENCOUNTER — Ambulatory Visit (INDEPENDENT_AMBULATORY_CARE_PROVIDER_SITE_OTHER): Payer: Medicare Other | Admitting: Family Medicine

## 2013-05-03 ENCOUNTER — Encounter: Payer: Self-pay | Admitting: Family Medicine

## 2013-05-03 VITALS — BP 126/84 | HR 61 | Temp 98.3°F | Ht 66.0 in | Wt 194.5 lb

## 2013-05-03 DIAGNOSIS — R0902 Hypoxemia: Secondary | ICD-10-CM

## 2013-05-03 DIAGNOSIS — IMO0002 Reserved for concepts with insufficient information to code with codable children: Secondary | ICD-10-CM

## 2013-05-03 DIAGNOSIS — R911 Solitary pulmonary nodule: Secondary | ICD-10-CM | POA: Insufficient documentation

## 2013-05-03 DIAGNOSIS — S2220XS Unspecified fracture of sternum, sequela: Secondary | ICD-10-CM
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
## 2013-05-03 NOTE — Patient Instructions (Addendum)
You will need a CT of your chest again in 1 year to re assess the nodule in your lung  Keep healing and stay active  Continue your physical therapy  If you become short of breath - let me or your cardiologist know right away  Take care of yourself

## 2013-05-03 NOTE — Assessment & Plan Note (Signed)
Pt is doing very well - not taking pain medicine and no c/o sob (actually doing PT for her knee)  Expect a good recovery-disc need to take deep breaths frequently to prevent atelectasis also  Will continue to follow
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
## 2013-05-05 ENCOUNTER — Ambulatory Visit (HOSPITAL_COMMUNITY): Payer: Medicare Other | Attending: Internal Medicine | Admitting: Radiology

## 2013-05-05 DIAGNOSIS — Z86711 Personal history of pulmonary embolism: Secondary | ICD-10-CM | POA: Insufficient documentation

## 2013-05-05 DIAGNOSIS — I272 Pulmonary hypertension, unspecified: Secondary | ICD-10-CM

## 2013-05-05 DIAGNOSIS — Z86718 Personal history of other venous thrombosis and embolism: Secondary | ICD-10-CM | POA: Insufficient documentation

## 2013-05-05 DIAGNOSIS — I10 Essential (primary) hypertension: Secondary | ICD-10-CM | POA: Insufficient documentation

## 2013-05-05 DIAGNOSIS — I270 Primary pulmonary hypertension: Secondary | ICD-10-CM | POA: Insufficient documentation

## 2013-05-05 DIAGNOSIS — I059 Rheumatic mitral valve disease, unspecified: Secondary | ICD-10-CM | POA: Insufficient documentation

## 2013-05-05 DIAGNOSIS — I079 Rheumatic tricuspid valve disease, unspecified: Secondary | ICD-10-CM | POA: Insufficient documentation

## 2013-05-05 NOTE — Progress Notes (Signed)
Echocardiogram performed.  

## 2013-06-01 ENCOUNTER — Other Ambulatory Visit: Payer: Self-pay | Admitting: *Deleted

## 2013-06-01 MED ORDER — RIVAROXABAN 20 MG PO TABS: 20.0000 mg | ORAL_TABLET | Freq: Every day | ORAL | Status: DC

## 2013-06-14 ENCOUNTER — Other Ambulatory Visit: Payer: Self-pay | Admitting: Family Medicine

## 2013-06-14 DIAGNOSIS — Z1231 Encounter for screening mammogram for malignant neoplasm of breast: Secondary | ICD-10-CM

## 2013-06-16 ENCOUNTER — Encounter: Payer: Self-pay | Admitting: Family Medicine

## 2013-06-16 ENCOUNTER — Ambulatory Visit (INDEPENDENT_AMBULATORY_CARE_PROVIDER_SITE_OTHER): Payer: Medicare Other | Admitting: Family Medicine

## 2013-06-16 VITALS — BP 122/70 | HR 66 | Temp 97.3°F | Wt 191.8 lb

## 2013-06-16 DIAGNOSIS — B359 Dermatophytosis, unspecified: Secondary | ICD-10-CM

## 2013-06-16 NOTE — Assessment & Plan Note (Signed)
Benign exam, try OTC cream for now, she may need one with a different active ingredient.  Discussed, f/u prn.

## 2013-06-16 NOTE — Patient Instructions (Addendum)
I would try the OTC cream for about 2 weeks.  If not resolved, then change to a different OTC medicine.  If still not better, then let us know.  Take care.

## 2013-06-16 NOTE — Progress Notes (Signed)
About to leave town and wanted skin lesion checked.  Progressive thickening of of the lesion on the R calf.  Noted Monday, unclear duration.  No itching, not painful.  She has not abnormal sensation in the area, only noted visually since Monday.  Tried OTC antifungal, unrecalled name, only used for about 2 days.  2cm lesion.    Meds, vitals, and allergies reviewed.   ROS: See HPI.  Otherwise, noncontributory.  nad Skin w/o acute lesion except for 2cm round area with central clearing on the R lower calf.

## 2013-07-05 ENCOUNTER — Ambulatory Visit (HOSPITAL_COMMUNITY)
Admission: RE | Admit: 2013-07-05 | Discharge: 2013-07-05 | Disposition: A | Discharge status: Home / Self Care | Payer: Medicare Other | Source: Ambulatory Visit | Attending: Family Medicine | Admitting: Family Medicine

## 2013-07-05 DIAGNOSIS — Z1231 Encounter for screening mammogram for malignant neoplasm of breast: Secondary | ICD-10-CM

## 2013-07-06 ENCOUNTER — Encounter: Payer: Self-pay | Admitting: *Deleted

## 2013-07-16 ENCOUNTER — Telehealth: Payer: Self-pay | Admitting: Nurse Practitioner

## 2013-07-16 ENCOUNTER — Encounter (HOSPITAL_COMMUNITY): Payer: Self-pay | Admitting: Emergency Medicine

## 2013-07-16 ENCOUNTER — Emergency Department (HOSPITAL_COMMUNITY): Payer: Medicare Other

## 2013-07-16 ENCOUNTER — Emergency Department (HOSPITAL_COMMUNITY)
Admission: EM | Admit: 2013-07-16 | Discharge: 2013-07-16 | Disposition: A | Discharge status: Home / Self Care | Payer: Medicare Other | Attending: Emergency Medicine | Admitting: Emergency Medicine

## 2013-07-16 DIAGNOSIS — R002 Palpitations: Secondary | ICD-10-CM | POA: Insufficient documentation

## 2013-07-16 DIAGNOSIS — Z880 Allergy status to penicillin: Secondary | ICD-10-CM | POA: Insufficient documentation

## 2013-07-16 DIAGNOSIS — Z86711 Personal history of pulmonary embolism: Secondary | ICD-10-CM | POA: Insufficient documentation

## 2013-07-16 DIAGNOSIS — Z8639 Personal history of other endocrine, nutritional and metabolic disease: Secondary | ICD-10-CM | POA: Insufficient documentation

## 2013-07-16 DIAGNOSIS — Z862 Personal history of diseases of the blood and blood-forming organs and certain disorders involving the immune mechanism: Secondary | ICD-10-CM | POA: Insufficient documentation

## 2013-07-16 DIAGNOSIS — M899 Disorder of bone, unspecified: Secondary | ICD-10-CM | POA: Insufficient documentation

## 2013-07-16 DIAGNOSIS — I471 Supraventricular tachycardia, unspecified: Secondary | ICD-10-CM | POA: Insufficient documentation

## 2013-07-16 DIAGNOSIS — R42 Dizziness and giddiness: Secondary | ICD-10-CM | POA: Insufficient documentation

## 2013-07-16 DIAGNOSIS — I2789 Other specified pulmonary heart diseases: Secondary | ICD-10-CM | POA: Insufficient documentation

## 2013-07-16 DIAGNOSIS — Z79899 Other long term (current) drug therapy: Secondary | ICD-10-CM | POA: Insufficient documentation

## 2013-07-16 LAB — POCT I-STAT TROPONIN I

## 2013-07-16 LAB — CBC
Hemoglobin: 15.1 g/dL — ABNORMAL HIGH (ref 12.0–15.0)
MCHC: 33.2 g/dL (ref 30.0–36.0)
RBC: 4.78 MIL/uL (ref 3.87–5.11)
WBC: 5.2 10*3/uL (ref 4.0–10.5)

## 2013-07-16 LAB — BASIC METABOLIC PANEL
Chloride: 102 mEq/L (ref 96–112)
GFR calc Af Amer: 64 mL/min — ABNORMAL LOW (ref >90)
GFR calc non Af Amer: 56 mL/min — ABNORMAL LOW (ref >90)
Glucose, Bld: 134 mg/dL — ABNORMAL HIGH (ref 70–99)
Potassium: 4.3 mEq/L (ref 3.5–5.1)
Sodium: 142 mEq/L (ref 135–145)

## 2013-07-16 MED ORDER — ADENOSINE 6 MG/2ML IV SOLN
6.0000 mg | Freq: Once | INTRAVENOUS | Status: AC
  Administered 2013-07-16: 6 mg via INTRAVENOUS

## 2013-07-16 MED ORDER — ADENOSINE 6 MG/2ML IV SOLN
12.0000 mg | Freq: Once | INTRAVENOUS | Status: DC
  Filled 2013-07-16: qty 4

## 2013-07-16 MED ORDER — SODIUM CHLORIDE 0.9 % IV BOLUS (SEPSIS)
500.0000 mL | Freq: Once | INTRAVENOUS | Status: AC
  Administered 2013-07-16: 500 mL via INTRAVENOUS

## 2013-07-16 NOTE — Telephone Encounter (Signed)
Pt called today stating that her HR is in the 150's and BP in the 80's.  She's been feeling washed out all day.  She has a h/o SVT and is on toprol and verapamil.  I explained that with her hypotension, she could not simply take additional meds @ home and instead recommended that she come into the ED for evaluation.  She verbalized understanding and agreed.

## 2013-07-16 NOTE — ED Provider Notes (Signed)
CSN: [id number]     Arrival date & time 07/16/13  1502 History   First MD Initiated Contact with Patient 07/16/13 1516     Chief Complaint  Patient presents with  . Tachycardia  . Dizziness   (Consider location/radiation/quality/duration/timing/severity/associated sxs/prior Treatment) Patient is a 79 y.o. female presenting with palpitations. The history is provided by the patient.  Palpitations Palpitations quality:  Regular Onset quality:  Sudden Duration:  2 hours Timing:  Constant Progression:  Unchanged Chronicity:  Recurrent Context: caffeine (small amount of diet pepsi)   Context: not illicit drugs and not stimulant use   Ineffective treatments:  None tried Associated symptoms: no back pain, no chest pain, no dizziness, no nausea, no numbness, no shortness of breath and no vomiting   Risk factors comment:  Hx of pSVT   Past Medical History  Diagnosis Date  . Osteopenia   . Hyperlipidemia   . Hypertension   . PSVT (paroxysmal supraventricular tachycardia)   . Spinal stenosis   . Overactive bladder     urge incontinence  . Pulmonary emboli   . Pulmonary hypertension   . Right ventricular failure    Past Surgical History  Procedure Laterality Date  . Cataract extraction  2008  . Bladder suspension  1993  . Joint replacement  2000    left TKR  . Total knee revision  01/12/2012    Procedure: TOTAL KNEE REVISION;  Surgeon: [name], MD;  Location: [facility] OR;  Service: Orthopedics;  Laterality: Left;  DEPUY/ LCS , HAND SET  . Trigger finger release  01/12/2012    Procedure: RELEASE TRIGGER FINGER/A-1 PULLEY;  Surgeon: [name], MD;  Location: [facility] OR;  Service: Orthopedics;  Laterality: Right;  . Cholecystectomy    . Hernia repair    . Total knee arthroplasty  10/29/2012    at [facility]   Family History  Problem Relation Age of Onset  . Cancer Mother     breast CA  . Heart disease Father     MI  . Diabetes Father   . Hypertension Father   . Cancer Sister      uterine CA   History  Substance Use Topics  . Smoking status: Never Smoker   . Smokeless tobacco: Not on file  . Alcohol Use: No   OB History   Grav Para Term Preterm Abortions TAB SAB Ect Mult Living                 Review of Systems  Constitutional: Negative for fever and chills.  HENT: Negative for trouble swallowing and neck pain.   Respiratory: Negative for chest tightness and shortness of breath.   Cardiovascular: Positive for palpitations. Negative for chest pain.  Gastrointestinal: Negative for nausea, vomiting and abdominal pain.  Genitourinary: Negative for dysuria and frequency.  Musculoskeletal: Negative for back pain.  Neurological: Positive for light-headedness. Negative for dizziness, speech difficulty, weakness, numbness and headaches.  All other systems reviewed and are negative.    Allergies  Penicillins and Sulfonamide derivatives  Home Medications   Current Outpatient Rx  Name  Route  Sig  Dispense  Refill  . Calcium Carbonate-Vitamin D (CALCIUM-VITAMIN D) 600-200 MG-UNIT CAPS   Oral   Take 1 capsule by mouth 3 (three) times daily.          . cholecalciferol (VITAMIN D) 1000 UNITS tablet   Oral   Take 1,000 Units by mouth 2 (two) times daily.          Marland Kitchen  metoprolol (LOPRESSOR) 50 MG tablet   Oral   Take 0.5 tablets (25 mg total) by mouth daily.   30 tablet   5   . Multiple Vitamins-Iron (MULTIVITAMIN/IRON PO)   Oral   Take 1 tablet by mouth daily.          . polyethylene glycol (MIRALAX / GLYCOLAX) packet   Oral   Take 17 g by mouth daily as needed. For constipation         . Rivaroxaban (XARELTO) 20 MG TABS tablet   Oral   Take 1 tablet (20 mg total) by mouth daily.   90 tablet   3   . verapamil (CALAN-SR) 120 MG CR tablet   Oral   Take 1 tablet (120 mg total) by mouth at bedtime.   30 tablet   3    BP 112/83  Pulse 138  Temp(Src) 97 F (36.1 C) (Oral)  Resp 20  SpO2 98% Physical Exam  Vitals  reviewed. Constitutional: She is oriented to person, place, and time. She appears well-developed and well-nourished. No distress.  HENT:  Right Ear: External ear normal.  Left Ear: External ear normal.  Mouth/Throat: No oropharyngeal exudate.  Eyes: Conjunctivae and EOM are normal. Pupils are equal, round, and reactive to light.  Neck: Normal range of motion. Neck supple.  Cardiovascular: Regular rhythm, normal heart sounds and intact distal pulses.  Exam reveals no gallop and no friction rub.   No murmur heard. tachycardic  Pulmonary/Chest: Effort normal and breath sounds normal.  Abdominal: Soft. Bowel sounds are normal. She exhibits no distension. There is no tenderness.  Musculoskeletal: Normal range of motion. She exhibits no edema.  Neurological: She is alert and oriented to person, place, and time. She has normal strength. No sensory deficit.  Skin: Skin is warm and dry. No rash noted. She is not diaphoretic.  Psychiatric: She has a normal mood and affect.    ED Course  Procedures (including critical care time) Labs Review Labs Reviewed  CBC - Abnormal; Notable for the following:    Hemoglobin 15.1 (*)    All other components within normal limits  BASIC METABOLIC PANEL - Abnormal; Notable for the following:    Glucose, Bld 134 (*)    BUN 28 (*)    GFR calc non Af Amer 56 (*)    GFR calc Af Amer 64 (*)    All other components within normal limits  POCT I-STAT TROPONIN I   Imaging Review Dg Chest Portable 1 View  07/16/2013   CLINICAL DATA:  Tachycardia and dizziness  EXAM: PORTABLE CHEST - 1 VIEW  COMPARISON:  04/25/2013  FINDINGS: There is mild cardiac enlargement. Pulmonary vascular congestion is noted. No pleural effusion identified. No airspace consolidation.  IMPRESSION: Mild cardiac enlargement and pulmonary vascular congestion.   Electronically Signed   By: Signa Kell M.D.   On: 07/16/2013 15:54      Date: 07/16/2013 #1 15:10  Rate: 138  Rhythm: SVT with  likely retrograde P wave, QRS length appears normal  QRS Axis: normal  Intervals: normal  ST/T Wave abnormalities: STD in lead I , II, mild STE of aVR - difficult to interpret in setting of likely retrograde P wave  Conduction Disutrbances:none  Narrative Interpretation: SVT likely jxn'l with retrograde P waves, STD in several leads but difficult to interpret in setting of SVT  Old EKG Reviewed: very similar to EKG from 12/30/12 during similar episode  Rhythm strip during adenosine administration: SVT with rate  of approximately 130, conversion to regular atrial rhythm    Date: 07/16/2013 #2 16:43  Rate: 89  Rhythm: ectopic atrial rhythm  QRS Axis: borderline left axis  Intervals: short PR  ST/T Wave abnormalities: nonspecific T wave changes  Conduction Disutrbances:none  Narrative Interpretation: Ectopic atrial rhythm has replaced SVT  Old EKG Reviewed: changes noted     MDM   28 y F here with lightheadedness in the setting of tachycardia.  No CP, SOB, cough, fevers, nausea, diaphoresis, vomiting.  BP stable.  Well appearing.  Lungs clear.  Similar presentation in March - 6 mg adenosine with no effect, 12 mg with conversion to sinus.  Verapamil added at 120 mg QHS, metoprolol decreased to 25 mg daily from 50 mg daily.  Pt of Dr. Johney Frame of [facility].   5:39 PM The case was discussed with [place] on-call provider and they recommended close follow-up and no medications changes.  It is felt the pt is stable for discharge with outpatient Cariology f/u.  Return precautions reviewed.  Clinical Impression: 1. Paroxysmal SVT (supraventricular tachycardia)     Disposition: Discharge  Condition: Good  I have discussed the results, Dx and Tx plan. They expressed understanding and agree with the plan and were told to return to ED with any worsening of condition or concern.    New Prescriptions   No medications on file    Follow Up: Hillis Range, MD [address] [place] [address] [phone]   Call Monday for next available appointment   Pt seen in conjunction with Dr. Jodi Mourning.  Reine Just. Beverely Pace, MD Emergency Medicine PGY-III [phone]       Oleh Genin, MD 07/16/13 1742  Medical screening examination/treatment/procedure(s) were conducted as a shared visit with non-physician practitioner(s) or resident and myself. I personally evaluated the patient during the encounter and agree with the findings and plan unless otherwise indicated.  Acute onset dizziness and palpitations, similar to previous SVT. Pt follows [place] cardiology. EKGs reviewed with resident, initial tachycardia - likely SVT with inverted p wave vs flutter, regular. Discussed r/b of adenosine, pt agreed. Pt feels well otherwise, denies CP. Pt on monitor, rhythm strip reviewed, converted to sinus rhythm with 6 mg adenosine. Pt on medicines at home and has fup, she prefers outpt fup, resident paged . Pt well appearing on recheck.      Enid Skeens, MD 07/18/13 [phone]

## 2013-07-16 NOTE — ED Notes (Signed)
Pt c/o rapid HR today with dizziness and not feeling well; pt denies CP; pt sts hx of similar

## 2013-07-19 ENCOUNTER — Telehealth: Payer: Self-pay | Admitting: *Deleted

## 2013-07-19 ENCOUNTER — Ambulatory Visit (INDEPENDENT_AMBULATORY_CARE_PROVIDER_SITE_OTHER): Payer: Medicare Other

## 2013-07-19 DIAGNOSIS — Z23 Encounter for immunization: Secondary | ICD-10-CM

## 2013-07-19 NOTE — Telephone Encounter (Signed)
I am aware, thanks and it looks like she has cardiology f/u for that early in Oct

## 2013-07-19 NOTE — Telephone Encounter (Signed)
Pt came in today for a flu shot and wanted you to be aware that she was at [facility] [place] 07/16/13.

## 2013-07-20 ENCOUNTER — Encounter: Payer: Self-pay | Admitting: Physician Assistant

## 2013-07-20 ENCOUNTER — Ambulatory Visit (INDEPENDENT_AMBULATORY_CARE_PROVIDER_SITE_OTHER): Payer: Medicare Other | Admitting: Physician Assistant

## 2013-07-20 VITALS — BP 141/75 | HR 54 | Ht 66.0 in | Wt 195.0 lb

## 2013-07-20 DIAGNOSIS — I471 Supraventricular tachycardia: Secondary | ICD-10-CM

## 2013-07-20 DIAGNOSIS — R0602 Shortness of breath: Secondary | ICD-10-CM

## 2013-07-20 DIAGNOSIS — I50810 Right heart failure, unspecified: Secondary | ICD-10-CM

## 2013-07-20 DIAGNOSIS — I2789 Other specified pulmonary heart diseases: Secondary | ICD-10-CM

## 2013-07-20 DIAGNOSIS — I509 Heart failure, unspecified: Secondary | ICD-10-CM

## 2013-07-20 DIAGNOSIS — I272 Pulmonary hypertension, unspecified: Secondary | ICD-10-CM

## 2013-07-20 MED ORDER — HYDROCHLOROTHIAZIDE 25 MG PO TABS: ORAL_TABLET | ORAL | Status: DC

## 2013-07-20 NOTE — Assessment & Plan Note (Signed)
Have patient have pulmonary vascular congestion on chest x-ray and complains of dyspnea on exertion with very little activity. Will add low-dose HCTZ 12.5 mg daily. I will see her back in 2 weeks and have labs drawn.
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
## 2013-07-20 NOTE — Assessment & Plan Note (Signed)
Patient had recent emergency room visit with SVT at 138 beats per min and converted with adenosine 12 mg. She is doing well on metoprolol and verapamil. I've asked her to decrease her caffeine intake and avoid decongestants. She will follow up with Dr. Johney Frame in 2 months.

## 2013-07-20 NOTE — Patient Instructions (Addendum)
START HCTZ 12.5MG  DAILY (1/2 TABLET)  Your physician recommends that you return for lab work in: 2 WEEK (BMET)  Your physician recommends that you schedule a follow-up appointment in: 2 WEEKS WITH Jacolyn Reedy, PA  Your physician recommends that you schedule a follow-up appointment in: 2 MONTHS WITH DR.ALLRED

## 2013-07-20 NOTE — Assessment & Plan Note (Signed)
Patient had an echo in July 2014. Follow up with Dr. Johney Frame

## 2013-07-25 ENCOUNTER — Ambulatory Visit: Payer: Medicare Other | Admitting: Internal Medicine

## 2013-08-01 ENCOUNTER — Ambulatory Visit (INDEPENDENT_AMBULATORY_CARE_PROVIDER_SITE_OTHER): Payer: Medicare Other | Admitting: Physician Assistant

## 2013-08-01 ENCOUNTER — Encounter: Payer: Self-pay | Admitting: Physician Assistant

## 2013-08-01 ENCOUNTER — Other Ambulatory Visit: Payer: Medicare Other

## 2013-08-01 VITALS — BP 132/66 | HR 59 | Ht 66.0 in | Wt 196.0 lb

## 2013-08-01 DIAGNOSIS — I272 Pulmonary hypertension, unspecified: Secondary | ICD-10-CM

## 2013-08-01 DIAGNOSIS — R911 Solitary pulmonary nodule: Secondary | ICD-10-CM

## 2013-08-01 DIAGNOSIS — R609 Edema, unspecified: Secondary | ICD-10-CM

## 2013-08-01 DIAGNOSIS — R0602 Shortness of breath: Secondary | ICD-10-CM

## 2013-08-01 DIAGNOSIS — I2699 Other pulmonary embolism without acute cor pulmonale: Secondary | ICD-10-CM

## 2013-08-01 DIAGNOSIS — I2789 Other specified pulmonary heart diseases: Secondary | ICD-10-CM

## 2013-08-01 DIAGNOSIS — I10 Essential (primary) hypertension: Secondary | ICD-10-CM

## 2013-08-01 DIAGNOSIS — I471 Supraventricular tachycardia: Secondary | ICD-10-CM

## 2013-08-01 LAB — BASIC METABOLIC PANEL
CO2: 32 mEq/L (ref 19–32)
Calcium: 9.4 mg/dL (ref 8.4–10.5)
GFR: 63.59 mL/min (ref >60.00)
Glucose, Bld: 99 mg/dL (ref 70–99)
Potassium: 3.6 mEq/L (ref 3.5–5.1)
Sodium: 138 mEq/L (ref 135–145)

## 2013-08-01 LAB — BRAIN NATRIURETIC PEPTIDE: Pro B Natriuretic peptide (BNP): 139 pg/mL — ABNORMAL HIGH (ref 0.0–100.0)

## 2013-08-01 NOTE — Patient Instructions (Addendum)
LABS TODAY; BMET, BNP  YOU HAVE A FOLLOW UP APPOINTMENT WITH DR. ALLRED 09/26/13 @ 12 PM  Your physician recommends that you continue on your current medications as directed. Please refer to the Current Medication list given to you today.

## 2013-08-01 NOTE — Progress Notes (Signed)
[address] [place], [place]  [address] Phone: [phone] Fax:  [phone]  Date:  08/01/2013   ID:  [name], DOB 1933-11-13, MRN [id number]  PCP:  [name], MD  Cardiologist:  Dr. Hillis Range     History of Present Illness: [name] is a 79 y.o. female who returns for follow up.  She has a history of SVT in the setting of acute pulmonary embolism in 10/2012.  Echocardiogram in 10/2012 demonstrated normal LV function and severe pulmonary hypertension (PASP 82).  She has deferred ablation in the past. Chest CT in 03/2013 demonstrated right lower lobe nodule. Follow up chest CT recommended in 6-12 months.  Last echocardiogram (7/14): Mild LVH, mild focal basal hypertrophy of the septum, EF 55-60%, normal wall motion, grade 1 diastolic dysfunction, mild MR, mild LAE, mild RVE, mild RAE, moderate TR, PASP 46.  She was last seen in this office by Jacolyn Reedy, PA-C in f/u after a trip to the ED with recurrent SVT. She converted with 12 mg of adenosine. She was noted to have pulmonary vascular congestion on chest x-ray. Because of this, she was placed on HCTZ 12.5 mg daily and asked to follow up today.  She continues to note dyspnea with exertion. She probably describes NYHA class IIb-3 symptoms. Her dyspnea with exertion is fairly chronic and has been ongoing for several months. She denies chest discomfort. She denies syncope. She denies any further rapid palpitations. She denies orthopnea, PND. She does note LE edema. She has noted increased weight.  Labs (4/14):  K 4, creatinine 0.9, ALT 16, HDL 49, LDL 86 Labs (6/14):  ProBNP 571 Labs (7/14):  TSH 5.696 Labs (9/14):  K 4.3, creatinine 0.96, Hgb 15.1  Wt Readings from Last 3 Encounters:  08/01/13 196 lb (88.905 kg)  07/20/13 195 lb (88.451 kg)  06/16/13 191 lb 12 oz (86.977 kg)     Past Medical History  Diagnosis Date  . Osteopenia   . Hyperlipidemia   . Hypertension   . PSVT (paroxysmal supraventricular  tachycardia)   . Spinal stenosis   . Overactive bladder     urge incontinence  . Pulmonary emboli   . Pulmonary hypertension   . Right ventricular failure     Current Outpatient Prescriptions  Medication Sig Dispense Refill  . Calcium Carbonate-Vitamin D (CALCIUM-VITAMIN D) 600-200 MG-UNIT CAPS Take 1 capsule by mouth 3 (three) times daily.       . cholecalciferol (VITAMIN D) 1000 UNITS tablet Take 1,000 Units by mouth 2 (two) times daily.       . diphenhydrAMINE (BENADRYL) 25 mg capsule Take 25 mg by mouth daily.      . hydrochlorothiazide (HYDRODIURIL) 25 MG tablet TAKE 1/2 TABLET DAILY  30 tablet  1  . metoprolol (LOPRESSOR) 50 MG tablet Take 0.5 tablets (25 mg total) by mouth daily.  30 tablet  5  . Multiple Vitamins-Iron (MULTIVITAMIN/IRON PO) Take 1 tablet by mouth daily.       . polyethylene glycol (MIRALAX / GLYCOLAX) packet Take 8.5-17 g by mouth daily as needed (for constipation).       . Rivaroxaban (XARELTO) 20 MG TABS tablet Take 1 tablet (20 mg total) by mouth daily.  90 tablet  3  . verapamil (CALAN-SR) 120 MG CR tablet Take 1 tablet (120 mg total) by mouth at bedtime.  30 tablet  3   No current facility-administered medications for this visit.    Allergies:  Allergies  Allergen Reactions  . Penicillins     REACTION: hives  . Sulfonamide Derivatives Hives    Social History:  The patient  reports that she has never smoked. She does not have any smokeless tobacco history on file. She reports that she does not drink alcohol or use illicit drugs.   Family History:  The patient's family history includes Cancer in her mother and sister; Diabetes in her father; Heart disease in her father; Hypertension in her father.   ROS:  Please see the history of present illness.   She has a nonproductive cough at times.   All other systems reviewed and negative.   PHYSICAL EXAM: VS:  BP 132/66  Pulse 59  Ht 5\' 6"  (1.676 m)  Wt 196 lb (88.905 kg)  BMI 31.65 kg/m2  SpO2  95% Well nourished, well developed, in no acute distress HEENT: normal Neck: no JVD Cardiac:  normal S1, S2; RRR; no murmur Lungs:  clear to auscultation bilaterally, no wheezing, rhonchi or rales Abd: soft, nontender, no hepatomegaly Ext: trace-1+ bilateral ankle edema Skin: warm and dry Neuro:  CNs 2-12 intact, no focal abnormalities noted  EKG:  Sinus bradycardia, HR 55, normal axis, nonspecific ST-T wave changes     ASSESSMENT AND PLAN:  1. Dyspnea:  This is likely multifactorial and related to a combination of pulmonary embolism, pulmonary hypertension, deconditioning, obesity, diastolic dysfunction. There is some confusion about what diuretic she was given previously. She seems to think that she was given Lasix and the pharmacy would not give this to her given her prior sulfa allergy. She is tolerating HCTZ. There has not been much improvement in her breathing or swelling with the addition of this medication. I will check a basic metabolic panel and BNP today. If her BNP is significantly elevated, I will change her HCTZ to Lasix. She should be able to tolerate Lasix even with a known sulfa allergy.  If dyspnea worsens, consider stress testing.  2. Paroxysmal SVT:  No apparent recurrence. Keep follow up with Dr. Johney Frame as planned. 3. Pulmonary Embolism:  She remains on Xarelto. 4. Pulmonary HTN:  Improved by recent echo.  Increased pressures in 10/2012 likely related to PE.   5. Lung Nodule:  She will need follow up chest CT in 03/2014. This can be arranged with her PCP. 6. Hypertension:  Controlled. 7. Disposition:  Keep follow up with Dr. Johney Frame in 09/2013 as planned.  Plan on earlier follow up with me if we adjust her diuretics.  Signed, Tereso Newcomer, PA-C  08/01/2013 3:10 PM

## 2013-08-02 ENCOUNTER — Ambulatory Visit: Payer: Medicare Other | Admitting: Physician Assistant

## 2013-08-03 ENCOUNTER — Other Ambulatory Visit: Payer: Medicare Other

## 2013-08-03 ENCOUNTER — Telehealth: Payer: Self-pay | Admitting: Internal Medicine

## 2013-08-03 NOTE — Telephone Encounter (Signed)
Follow Up   Pt calling for lab results.

## 2013-08-03 NOTE — Telephone Encounter (Signed)
pt notified about lab results today with verbal understanding  

## 2013-08-18 ENCOUNTER — Telehealth: Payer: Self-pay | Admitting: *Deleted

## 2013-08-18 NOTE — Telephone Encounter (Signed)
[facility] approval on xarelto until 08/18/2014 case ID [id number]

## 2013-09-14 ENCOUNTER — Other Ambulatory Visit: Payer: Self-pay

## 2013-09-14 MED ORDER — VERAPAMIL HCL ER 120 MG PO TBCR: 120.0000 mg | EXTENDED_RELEASE_TABLET | Freq: Every day | ORAL | Status: DC

## 2013-09-26 ENCOUNTER — Encounter: Payer: Self-pay | Admitting: Internal Medicine

## 2013-09-26 ENCOUNTER — Ambulatory Visit (INDEPENDENT_AMBULATORY_CARE_PROVIDER_SITE_OTHER): Payer: Medicare Other | Admitting: Internal Medicine

## 2013-09-26 VITALS — BP 119/66 | HR 57 | Ht 66.0 in | Wt 200.1 lb

## 2013-09-26 DIAGNOSIS — I272 Pulmonary hypertension, unspecified: Secondary | ICD-10-CM

## 2013-09-26 DIAGNOSIS — I10 Essential (primary) hypertension: Secondary | ICD-10-CM

## 2013-09-26 DIAGNOSIS — I471 Supraventricular tachycardia, unspecified: Secondary | ICD-10-CM

## 2013-09-26 DIAGNOSIS — I2789 Other specified pulmonary heart diseases: Secondary | ICD-10-CM

## 2013-09-26 DIAGNOSIS — I2699 Other pulmonary embolism without acute cor pulmonale: Secondary | ICD-10-CM

## 2013-09-26 NOTE — Progress Notes (Signed)
PCP:  [name], MD  The patient presents today for cardiology followup..  Since her last visit, she has done reasonably well. She continues to have SOB with moderate activity.  Her edema is improved.  She has had no further SVT.  She has occasional fatigue. Today, she denies symptoms of exertional chest pain,orthopnea, PND, dizziness, presyncope, syncope, or neurologic sequela.  The patient feels that she is tolerating medications without difficulties and is otherwise without complaint today.   Past Medical History  Diagnosis Date  . Osteopenia   . Hyperlipidemia   . Hypertension   . PSVT (paroxysmal supraventricular tachycardia)   . Spinal stenosis   . Overactive bladder     urge incontinence  . Pulmonary emboli   . Pulmonary hypertension   . Right ventricular failure    Past Surgical History  Procedure Laterality Date  . Cataract extraction  2008  . Bladder suspension  1993  . Joint replacement  2000    left TKR  . Total knee revision  01/12/2012    Procedure: TOTAL KNEE REVISION;  Surgeon: [name], MD;  Location: [facility] OR;  Service: Orthopedics;  Laterality: Left;  DEPUY/ LCS , HAND SET  . Trigger finger release  01/12/2012    Procedure: RELEASE TRIGGER FINGER/A-1 PULLEY;  Surgeon: [name], MD;  Location: [facility] OR;  Service: Orthopedics;  Laterality: Right;  . Cholecystectomy    . Hernia repair    . Total knee arthroplasty  10/29/2012    at [facility]    Current Outpatient Prescriptions  Medication Sig Dispense Refill  . Calcium Carbonate-Vitamin D (CALCIUM-VITAMIN D) 600-200 MG-UNIT CAPS Take 1 capsule by mouth 3 (three) times daily.       . cholecalciferol (VITAMIN D) 1000 UNITS tablet Take 1,000 Units by mouth 2 (two) times daily.       . diphenhydrAMINE (BENADRYL) 25 mg capsule Take 25 mg by mouth daily.      . hydrochlorothiazide (HYDRODIURIL) 25 MG tablet TAKE 1/2 TABLET DAILY  30 tablet  1  . Multiple Vitamins-Iron (MULTIVITAMIN/IRON PO) Take 1 tablet by mouth daily.        . polyethylene glycol (MIRALAX / GLYCOLAX) packet Take 8.5-17 g by mouth daily as needed (for constipation).       . Rivaroxaban (XARELTO) 20 MG TABS tablet Take 1 tablet (20 mg total) by mouth daily.  90 tablet  3  . verapamil (CALAN-SR) 120 MG CR tablet Take 1 tablet (120 mg total) by mouth at bedtime.  30 tablet  3   No current facility-administered medications for this visit.    Allergies  Allergen Reactions  . Penicillins     REACTION: hives  . Sulfonamide Derivatives Hives    History   Social History  . Marital Status: Married    Spouse Name: N/A    Number of Children: N/A  . Years of Education: N/A   Occupational History  . Retired    Social History Main Topics  . Smoking status: Never Smoker   . Smokeless tobacco: Not on file  . Alcohol Use: No  . Drug Use: No  . Sexual Activity: No   Other Topics Concern  . Not on file   Social History Narrative   Pt lives alone but dtr lives on property and has been staying with her since surgery.    Family History  Problem Relation Age of Onset  . Cancer Mother     breast CA  . Heart disease Father  MI  . Diabetes Father   . Hypertension Father   . Cancer Sister     uterine CA    Physical Exam: Filed Vitals:   09/26/13 1218  BP: 119/66  Pulse: 57  Height: 5\' 6"  (1.676 m)  Weight: 200 lb 1.9 oz (90.774 kg)    GEN- The patient is elderly appearing, alert and oriented x 3 today.   Head- normocephalic, atraumatic Eyes-  Sclera clear, conjunctiva pink Ears- hearing intact Oropharynx- clear Neck- supple, JVP 9cm Lungs- Clear to ausculation bilaterally, normal work of breathing Heart- Regular rate and rhythm, no murmurs, rubs or gallops, PMI not laterally displaced GI- soft, NT, ND, + BS Extremities- no clubbing, cyanosis, edema is resolved Neuro- strength and sensation are intact  ekg today reveals sinus rhythm 57 bpm, PR 196, QRS 88, QTc 416, otherwise normal ekg  Assessment and Plan:  1.  PTE Doing well with xarelto No changes today Echo to evaluate pulmonary pressures  2. SVT Well controlled She does not wish to consider ablation Stop metoprolol due to SOB and bradycardia  3. Pulmonary hypertension Likely due to PTE Repeat echo upon return to see Tereso Newcomer in 6 months  Return to see Lorin Picket in 6 months I will see in 12 months

## 2013-09-26 NOTE — Patient Instructions (Signed)
Your physician wants you to follow-up in: 6 months with [name] and 12 months with Dr Jacquiline Doe will receive a reminder letter in the mail two months in advance. If you don't receive a letter, please call our office to schedule the follow-up appointment.   Your physician has recommended you make the following change in your medication:  1) Stop Metoprolol

## 2013-09-29 ENCOUNTER — Encounter (HOSPITAL_COMMUNITY): Payer: Self-pay | Admitting: Emergency Medicine

## 2013-09-29 ENCOUNTER — Emergency Department (HOSPITAL_COMMUNITY)
Admission: EM | Admit: 2013-09-29 | Discharge: 2013-09-29 | Disposition: A | Discharge status: Home / Self Care | Payer: Medicare Other | Attending: Emergency Medicine | Admitting: Emergency Medicine

## 2013-09-29 ENCOUNTER — Telehealth: Payer: Self-pay | Admitting: Internal Medicine

## 2013-09-29 DIAGNOSIS — I10 Essential (primary) hypertension: Secondary | ICD-10-CM | POA: Insufficient documentation

## 2013-09-29 DIAGNOSIS — R42 Dizziness and giddiness: Secondary | ICD-10-CM | POA: Insufficient documentation

## 2013-09-29 DIAGNOSIS — Z8739 Personal history of other diseases of the musculoskeletal system and connective tissue: Secondary | ICD-10-CM | POA: Insufficient documentation

## 2013-09-29 DIAGNOSIS — Z880 Allergy status to penicillin: Secondary | ICD-10-CM | POA: Insufficient documentation

## 2013-09-29 DIAGNOSIS — Z79899 Other long term (current) drug therapy: Secondary | ICD-10-CM | POA: Insufficient documentation

## 2013-09-29 DIAGNOSIS — Z86718 Personal history of other venous thrombosis and embolism: Secondary | ICD-10-CM | POA: Insufficient documentation

## 2013-09-29 DIAGNOSIS — I471 Supraventricular tachycardia, unspecified: Secondary | ICD-10-CM | POA: Insufficient documentation

## 2013-09-29 DIAGNOSIS — Z862 Personal history of diseases of the blood and blood-forming organs and certain disorders involving the immune mechanism: Secondary | ICD-10-CM | POA: Insufficient documentation

## 2013-09-29 DIAGNOSIS — Z8639 Personal history of other endocrine, nutritional and metabolic disease: Secondary | ICD-10-CM | POA: Insufficient documentation

## 2013-09-29 LAB — CBC
Hemoglobin: 15.1 g/dL — ABNORMAL HIGH (ref 12.0–15.0)
MCV: 95.4 fL (ref 78.0–100.0)
Platelets: 225 10*3/uL (ref 150–400)
RBC: 4.77 MIL/uL (ref 3.87–5.11)
RDW: 13.1 % (ref 11.5–15.5)
WBC: 5.1 10*3/uL (ref 4.0–10.5)

## 2013-09-29 LAB — POCT I-STAT TROPONIN I
Troponin i, poc: 0 ng/mL (ref 0.00–0.08)
Troponin i, poc: 0.02 ng/mL (ref 0.00–0.08)

## 2013-09-29 LAB — BASIC METABOLIC PANEL
CO2: 26 mEq/L (ref 19–32)
Chloride: 100 mEq/L (ref 96–112)
Potassium: 3.7 mEq/L (ref 3.5–5.1)
Sodium: 139 mEq/L (ref 135–145)

## 2013-09-29 MED ORDER — METOPROLOL TARTRATE 25 MG PO TABS: 25.0000 mg | ORAL_TABLET | Freq: Every day | ORAL | Status: DC

## 2013-09-29 NOTE — ED Provider Notes (Signed)
I saw and evaluated the patient, reviewed the resident's note and I agree with the findings and plan.  EKG Interpretation    Date/Time:  Thursday September 29 2013 18:32:48 EST Ventricular Rate:  150 PR Interval:    QRS Duration: 132 QT Interval:  294 QTC Calculation: 464 R Axis:   48 Text Interpretation:  Supraventricular tachycardia Non-specific intra-ventricular conduction block Confirmed by Anitra Lauth  MD, Deliliah Spranger ([id number]) on 09/29/2013 6:58:00 PM            Pt with recurrent SVT on EKG today.  Stopped metoprolol appx 1 week ago.  Denies other sx except for feeling lightheaded.  No CP, SOB.  Labs reassuring and pt converted prior to therapy.  Cardiology recommended restarting metoprolol and f/u with Dr. Johney Frame next week.  Gwyneth Sprout, MD 09/29/13 2138

## 2013-09-29 NOTE — Telephone Encounter (Signed)
New Problem:  Pt's daughter states the doctor took her mom off of Metoprolol.. Pt has been dizzy ever since and her BP was 136/89 at 12:30 today. 152/85 at 12:42pm today... 110/74 at 1:35pm today... Heart rate was 144 and 147...  Pt's daughter would like to speak to the nurse.

## 2013-09-29 NOTE — ED Provider Notes (Signed)
CSN: [id number]     Arrival date & time 09/29/13  1828 History   First MD Initiated Contact with Patient 09/29/13 1903     Chief Complaint  Patient presents with  . Tachycardia   (Consider location/radiation/quality/duration/timing/severity/associated sxs/prior Treatment) HPI  Chief complaint: Tachycardia, dizziness  Patient is a 79 year old female past medical history significant for PSVT comes in with complaints of tachycardia and dizziness. Patient states she's had 3 episodes today where she felt slightly lightheaded and dizzy. Upon taking her pulse during these times noted her heart rate to be 140 or greater. These episodes last several minutes self resolved. mild in severity. No aggravating or alleviating factors noted. Patient states these were not brought on by activity or other identified factor. Patient's experienced no shortness of breath or chest pain. Patient has a history of SVT. Was taken off of metoprolol approximately 48 hours ago.   Past Medical History  Diagnosis Date  . Osteopenia   . Hyperlipidemia   . Hypertension   . PSVT (paroxysmal supraventricular tachycardia)   . Spinal stenosis   . Overactive bladder     urge incontinence  . Pulmonary emboli   . Pulmonary hypertension   . Right ventricular failure    Past Surgical History  Procedure Laterality Date  . Cataract extraction  2008  . Bladder suspension  1993  . Joint replacement  2000    left TKR  . Total knee revision  01/12/2012    Procedure: TOTAL KNEE REVISION;  Surgeon: [name], MD;  Location: [facility] OR;  Service: Orthopedics;  Laterality: Left;  DEPUY/ LCS , HAND SET  . Trigger finger release  01/12/2012    Procedure: RELEASE TRIGGER FINGER/A-1 PULLEY;  Surgeon: [name], MD;  Location: [facility] OR;  Service: Orthopedics;  Laterality: Right;  . Cholecystectomy    . Hernia repair    . Total knee arthroplasty  10/29/2012    at [facility]   Family History  Problem Relation Age of Onset  . Cancer Mother      breast CA  . Heart disease Father     MI  . Diabetes Father   . Hypertension Father   . Cancer Sister     uterine CA   History  Substance Use Topics  . Smoking status: Never Smoker   . Smokeless tobacco: Not on file  . Alcohol Use: No   OB History   Grav Para Term Preterm Abortions TAB SAB Ect Mult Living                 Review of Systems  Constitutional: Negative for fatigue.  Respiratory: Negative for shortness of breath.   Cardiovascular: Positive for palpitations. Negative for chest pain.  Gastrointestinal: Negative for abdominal pain.  Genitourinary: Negative for dysuria.  Musculoskeletal: Negative for back pain.  Skin: Negative for rash.  Neurological: Positive for light-headedness. Negative for headaches.  Psychiatric/Behavioral: Negative for agitation.  All other systems reviewed and are negative.    Allergies  Adhesive; Penicillins; and Sulfonamide derivatives  Home Medications   Current Outpatient Rx  Name  Route  Sig  Dispense  Refill  . Calcium Carbonate-Vitamin D (CALCIUM-VITAMIN D) 600-200 MG-UNIT CAPS   Oral   Take 1 capsule by mouth 2 (two) times daily.          . cholecalciferol (VITAMIN D) 1000 UNITS tablet   Oral   Take 1,000 Units by mouth 2 (two) times daily.          Marland Kitchen  diphenhydrAMINE (BENADRYL) 25 mg capsule   Oral   Take 25 mg by mouth daily.         . hydrochlorothiazide (HYDRODIURIL) 25 MG tablet   Oral   Take 12.5 mg by mouth daily.         . Multiple Vitamin (MULTIVITAMIN WITH MINERALS) TABS tablet   Oral   Take 1 tablet by mouth daily.         . naproxen sodium (ALEVE) 220 MG tablet   Oral   Take 220 mg by mouth 2 (two) times daily as needed (pain).         . polyethylene glycol (MIRALAX / GLYCOLAX) packet   Oral   Take 8.5 g by mouth daily.          . Rivaroxaban (XARELTO) 20 MG TABS tablet   Oral   Take 1 tablet (20 mg total) by mouth daily.   90 tablet   3   . verapamil (CALAN-SR) 120 MG CR  tablet   Oral   Take 1 tablet (120 mg total) by mouth at bedtime.   30 tablet   3   . metoprolol (LOPRESSOR) 25 MG tablet   Oral   Take 1 tablet (25 mg total) by mouth daily.   30 tablet   0    BP 104/81  Pulse 149  Temp(Src) 98 F (36.7 C) (Oral)  Resp 16  Ht 5\' 6"  (1.676 m)  Wt 199 lb (90.266 kg)  BMI 32.13 kg/m2  SpO2 95% Physical Exam  Nursing note and vitals reviewed. Constitutional: She is oriented to person, place, and time. She appears well-developed and well-nourished.  HENT:  Head: Normocephalic and atraumatic.  Eyes: EOM are normal. Pupils are equal, round, and reactive to light.  Neck: Normal range of motion.  Cardiovascular: Normal rate, regular rhythm and intact distal pulses.   Triage note patient tachycardic 149. On my exam patient heart rate in the 70s.  Pulmonary/Chest: Effort normal and breath sounds normal. No respiratory distress.  Abdominal: Soft. She exhibits no distension. There is no tenderness. There is no rebound and no guarding.  Musculoskeletal: Normal range of motion. She exhibits no edema.  Neurological: She is alert and oriented to person, place, and time. No cranial nerve deficit. She exhibits normal muscle tone. Coordination normal.  Skin: Skin is warm and dry.  Psychiatric: She has a normal mood and affect. Her behavior is normal. Judgment and thought content normal.    ED Course  Procedures (including critical care time) Labs Review Labs Reviewed  CBC - Abnormal; Notable for the following:    Hemoglobin 15.1 (*)    All other components within normal limits  BASIC METABOLIC PANEL - Abnormal; Notable for the following:    Glucose, Bld 103 (*)    GFR calc non Af Amer 64 (*)    GFR calc Af Amer 75 (*)    All other components within normal limits  POCT I-STAT TROPONIN I   Imaging Review No results found.  EKG Interpretation    Date/Time:  Thursday September 29 2013 18:32:48 EST Ventricular Rate:  150 PR Interval:    QRS  Duration: 132 QT Interval:  294 QTC Calculation: 464 R Axis:   48 Text Interpretation:  Supraventricular tachycardia Non-specific intra-ventricular conduction block Confirmed by Anitra Lauth  MD, WHITNEY ([id number]) on 09/29/2013 6:58:00 PM            MDM   1. PSVT     79 year old female history PSVT  arrives tachycardic. Initial EKG revealed SVT. Patient hemodynamically stable mentating well no shortness of breath. While patient was being triaged by RN and placed on monitor, patient spontaneously converted. Patient does state she was recently taken off her metoprolol 48 hours ago. Likely in PSVT now secondary to discontinuing metoprolol. Patient was observed for 2 hours in emergency department. Continued to be in sinus rhythm without tachycardia. CBC, BMP and troponin within normal limits. Patient with no chest pain shortness of breath or other concerning signs or symptoms. Doubt CAD or other serious intrathoracic pathology. Discussed with cardiology and believe appropriate for patient restart metoprolol. Will restart a 25 mg and followup with cardiology within one week. Patient given strict return precautions for signs of symptomatic bradycardia, presyncope, syncope etc. Remains stable no further issues until discharge.   Bridgett Larsson, MD 09/29/13 2025

## 2013-09-29 NOTE — ED Notes (Addendum)
Pt reports 3 episodes of rapid pulse rate while at rest this afternoon. States she could feel her heart racing and counted pulse rate around 140 each time. Pt denies pain or other complaints.

## 2013-09-29 NOTE — ED Notes (Signed)
Pt given d/c instructions and verbalized understanding. NAD at this time. VS are stable.  

## 2013-09-29 NOTE — Telephone Encounter (Signed)
I spoke with both pt & her daughter about heart rate. Pt states the fast heart rate only lasted a couple of minutes then broke. Stated she is asymptomatic & feeling fine now. Her daughter stated that her mother " is under 3 blankets & having chills"  I spoke with the pt about this & she denies any congestion, sore throat, fever *(T 97.7) no shortness of breath (O2sat 92% per daughter) She understands if her heartrate sustains & is symptomatic she needs to go to the ED. Reassurance given Mylo Red RN

## 2013-09-29 NOTE — Telephone Encounter (Signed)
Spoke with Er physician . Pt stopped metoprolol and per ER physician had recurrence of her SVT. Currently vitally stable in the ER with NSR.  Asked to restart her metoprolol and followup with her cardiologist closely.

## 2013-09-30 ENCOUNTER — Telehealth: Payer: Self-pay | Admitting: Internal Medicine

## 2013-09-30 NOTE — Telephone Encounter (Signed)
New problem:  Pt states she was just seen at the hospital for PSVT and pt is wanting to know if she should go back on her Metoprolol. Pt states the ER doctor told her that stopping her medication could've been what brought her to the ER. Pt also wants to know if it is necessary she come to her EPH appt since she just saw Dr. Johney Frame on December 1st.... Pt would like to be advised about her meds and told if she medically needs her post hospital follow up appt.

## 2013-09-30 NOTE — Telephone Encounter (Signed)
Patient will take the Metoprolol over the weekend and I will talk with Dr Johney Frame on Mon. She is aware

## 2013-10-03 NOTE — Telephone Encounter (Signed)
Discussed with Dr Johney Frame, continue the Metoprolol and follow up as scheduled.  Patient aware

## 2013-10-12 ENCOUNTER — Encounter: Payer: Medicare Other | Admitting: Internal Medicine

## 2013-11-18 ENCOUNTER — Encounter: Payer: Self-pay | Admitting: Internal Medicine

## 2013-11-18 ENCOUNTER — Ambulatory Visit (INDEPENDENT_AMBULATORY_CARE_PROVIDER_SITE_OTHER): Payer: Medicare Other | Admitting: Internal Medicine

## 2013-11-18 VITALS — BP 130/78 | HR 56 | Temp 97.9°F | Wt 198.5 lb

## 2013-11-18 DIAGNOSIS — J209 Acute bronchitis, unspecified: Secondary | ICD-10-CM

## 2013-11-18 MED ORDER — LEVOFLOXACIN 500 MG PO TABS: 500.0000 mg | ORAL_TABLET | Freq: Every day | ORAL | Status: DC

## 2013-11-18 NOTE — Patient Instructions (Signed)
Acute Bronchitis Bronchitis is inflammation of the airways that extend from the windpipe into the lungs (bronchi). The inflammation often causes mucus to develop. This leads to a cough, which is the most common symptom of bronchitis.  In acute bronchitis, the condition usually develops suddenly and goes away over time, usually in a couple weeks. Smoking, allergies, and asthma can make bronchitis worse. Repeated episodes of bronchitis may cause further lung problems.  CAUSES Acute bronchitis is most often caused by the same virus that causes a cold. The virus can spread from person to person (contagious).  SIGNS AND SYMPTOMS   Cough.   Fever.   Coughing up mucus.   Body aches.   Chest congestion.   Chills.   Shortness of breath.   Sore throat.  DIAGNOSIS  Acute bronchitis is usually diagnosed through a physical exam. Tests, such as chest X-rays, are sometimes done to rule out other conditions.  TREATMENT  Acute bronchitis usually goes away in a couple weeks. Often times, no medical treatment is necessary. Medicines are sometimes given for relief of fever or cough. Antibiotics are usually not needed but may be prescribed in certain situations. In some cases, an inhaler may be recommended to help reduce shortness of breath and control the cough. A cool mist vaporizer may also be used to help thin bronchial secretions and make it easier to clear the chest.  HOME CARE INSTRUCTIONS  Get plenty of rest.   Drink enough fluids to keep your urine clear or pale yellow (unless you have a medical condition that requires fluid restriction). Increasing fluids may help thin your secretions and will prevent dehydration.   Only take over-the-counter or prescription medicines as directed by your health care provider.   Avoid smoking and secondhand smoke. Exposure to cigarette smoke or irritating chemicals will make bronchitis worse. If you are a smoker, consider using nicotine gum or skin  patches to help control withdrawal symptoms. Quitting smoking will help your lungs heal faster.   Reduce the chances of another bout of acute bronchitis by washing your hands frequently, avoiding people with cold symptoms, and trying not to touch your hands to your mouth, nose, or eyes.   Follow up with your health care provider as directed.  SEEK MEDICAL CARE IF: Your symptoms do not improve after 1 week of treatment.  SEEK IMMEDIATE MEDICAL CARE IF:  You develop an increased fever or chills.   You have chest pain.   You have severe shortness of breath.  You have bloody sputum.   You develop dehydration.  You develop fainting.  You develop repeated vomiting.  You develop a severe headache. MAKE SURE YOU:   Understand these instructions.  Will watch your condition.  Will get help right away if you are not doing well or get worse. Document Released: 11/20/2004 Document Revised: 06/15/2013 Document Reviewed: 04/05/2013 [employer] Patient Information 2014 [employer].  

## 2013-11-18 NOTE — Progress Notes (Signed)
Pre-visit discussion using our clinic review tool. No additional management support is needed unless otherwise documented below in the visit note.  

## 2013-11-18 NOTE — Progress Notes (Signed)
HPI  Pt presents to the clinic today with c/o cough, chest congestion and wheezing. This started 4 days ago. The cough is productive of yellow sputum. She is unsure if she is running fevers but she has had chills. She has no history of allergies or breathing problems. She hasn't taken much OTC because she wasn't sure what to take. She has had sick contacts.  Review of Systems      Past Medical History  Diagnosis Date  . Osteopenia   . Hyperlipidemia   . Hypertension   . PSVT (paroxysmal supraventricular tachycardia)   . Spinal stenosis   . Overactive bladder     urge incontinence  . Pulmonary emboli   . Pulmonary hypertension   . Right ventricular failure     Family History  Problem Relation Age of Onset  . Cancer Mother     breast CA  . Heart disease Father     MI  . Diabetes Father   . Hypertension Father   . Cancer Sister     uterine CA    History   Social History  . Marital Status: Married    Spouse Name: N/A    Number of Children: N/A  . Years of Education: N/A   Occupational History  . Retired    Social History Main Topics  . Smoking status: Never Smoker   . Smokeless tobacco: Not on file  . Alcohol Use: No  . Drug Use: No  . Sexual Activity: No   Other Topics Concern  . Not on file   Social History Narrative   Pt lives alone but dtr lives on property and has been staying with her since surgery.    Allergies  Allergen Reactions  . Adhesive [Tape] Other (See Comments)    redness  . Penicillins Hives  . Sulfonamide Derivatives Hives     Constitutional: Positive fatigue and fever. Denies headache, abrupt weight changes.  HEENT:  Positive sore throat. Denies eye redness, eye pain, pressure behind the eyes, facial pain, nasal congestion, ear pain, ringing in the ears, wax buildup, runny nose or bloody nose. Respiratory: Positive cough. Denies difficulty breathing or shortness of breath.  Cardiovascular: Denies chest pain, chest tightness,  palpitations or swelling in the hands or feet.   No other specific complaints in a complete review of systems (except as listed in HPI above).  Objective:   BP 130/78  Pulse 56  Temp(Src) 97.9 F (36.6 C) (Oral)  Wt 198 lb 8 oz (90.039 kg)  SpO2 96% Wt Readings from Last 3 Encounters:  11/18/13 198 lb 8 oz (90.039 kg)  09/29/13 199 lb (90.266 kg)  09/26/13 200 lb 1.9 oz (90.774 kg)     General: Appears his stated age, well developed, well nourished in NAD. HEENT: Head: normal shape and size; Eyes: sclera white, no icterus, conjunctiva pink, PERRLA and EOMs intact; Ears: Tm's gray and intact, normal light reflex; Nose: mucosa pink and moist, septum midline; Throat/Mouth: + PND. Teeth present, mucosa erythematous and moist, no exudate noted, no lesions or ulcerations noted.  Neck: Mild cervical lymphadenopathy. Neck supple, trachea midline. No massses, lumps or thyromegaly present.  Cardiovascular: Normal rate and rhythm. S1,S2 noted.  No murmur, rubs or gallops noted. No JVD or BLE edema. No carotid bruits noted. Pulmonary/Chest: Normal effort and scattered rhonchi throughout. No respiratory distress. No wheezes, rales or ronchi noted.      Assessment & Plan:   Acute Bronchitis:  Get some rest and drink plenty  of water Do salt water gargles for the sore throat eRx for Levaquin x 7 days RTC as needed or if symptoms persist.

## 2013-11-24 ENCOUNTER — Encounter: Payer: Self-pay | Admitting: Internal Medicine

## 2013-11-24 ENCOUNTER — Ambulatory Visit (INDEPENDENT_AMBULATORY_CARE_PROVIDER_SITE_OTHER): Payer: Medicare Other | Admitting: Internal Medicine

## 2013-11-24 ENCOUNTER — Ambulatory Visit (INDEPENDENT_AMBULATORY_CARE_PROVIDER_SITE_OTHER)
Admission: RE | Admit: 2013-11-24 | Discharge: 2013-11-24 | Disposition: A | Discharge status: Home / Self Care | Payer: Medicare Other | Source: Ambulatory Visit | Attending: Internal Medicine | Admitting: Internal Medicine

## 2013-11-24 VITALS — BP 120/74 | HR 63 | Temp 97.3°F | Wt 196.5 lb

## 2013-11-24 DIAGNOSIS — J209 Acute bronchitis, unspecified: Secondary | ICD-10-CM

## 2013-11-24 DIAGNOSIS — R05 Cough: Secondary | ICD-10-CM

## 2013-11-24 DIAGNOSIS — R059 Cough, unspecified: Secondary | ICD-10-CM

## 2013-11-24 MED ORDER — BENZONATATE 200 MG PO CAPS: 200.0000 mg | ORAL_CAPSULE | Freq: Two times a day (BID) | ORAL | Status: DC | PRN

## 2013-11-24 NOTE — Progress Notes (Signed)
Subjective:    Patient ID: [name], female    DOB: 1934-09-21, 80 y.o.   MRN: [id number]  HPI  Pt presents to the clinic today with c/o cough. Sh was seen 1/23 for the same. He was diagnosed with acute bronchitis and started on Levaquin at that time. She has not noticed any improvement. She is blowing thick yellow mucous out of her nose and coughing mucous up out of her chest. She denies fever, chills or shortness of breath. She has had some fatigue.  Review of Systems      Past Medical History  Diagnosis Date  . Osteopenia   . Hyperlipidemia   . Hypertension   . PSVT (paroxysmal supraventricular tachycardia)   . Spinal stenosis   . Overactive bladder     urge incontinence  . Pulmonary emboli   . Pulmonary hypertension   . Right ventricular failure     Current Outpatient Prescriptions  Medication Sig Dispense Refill  . Calcium Carbonate-Vitamin D (CALCIUM-VITAMIN D) 600-200 MG-UNIT CAPS Take 1 capsule by mouth 2 (two) times daily.       . cholecalciferol (VITAMIN D) 1000 UNITS tablet Take 1,000 Units by mouth 2 (two) times daily.       . diphenhydrAMINE (BENADRYL) 25 mg capsule Take 25 mg by mouth daily.      . hydrochlorothiazide (HYDRODIURIL) 25 MG tablet Take 12.5 mg by mouth daily.      Marland Kitchen levofloxacin (LEVAQUIN) 500 MG tablet Take 1 tablet (500 mg total) by mouth daily.  7 tablet  0  . metoprolol (LOPRESSOR) 25 MG tablet Take 1 tablet (25 mg total) by mouth daily.  30 tablet  0  . Multiple Vitamin (MULTIVITAMIN WITH MINERALS) TABS tablet Take 1 tablet by mouth daily.      . naproxen sodium (ALEVE) 220 MG tablet Take 220 mg by mouth 2 (two) times daily as needed (pain).      . polyethylene glycol (MIRALAX / GLYCOLAX) packet Take 8.5 g by mouth daily.       . Rivaroxaban (XARELTO) 20 MG TABS tablet Take 1 tablet (20 mg total) by mouth daily.  90 tablet  3  . verapamil (CALAN-SR) 120 MG CR tablet Take 1 tablet (120 mg total) by mouth at bedtime.  30 tablet  3   No  current facility-administered medications for this visit.    Allergies  Allergen Reactions  . Adhesive [Tape] Other (See Comments)    redness  . Penicillins Hives  . Sulfonamide Derivatives Hives    Family History  Problem Relation Age of Onset  . Cancer Mother     breast CA  . Heart disease Father     MI  . Diabetes Father   . Hypertension Father   . Cancer Sister     uterine CA    History   Social History  . Marital Status: Married    Spouse Name: N/A    Number of Children: N/A  . Years of Education: N/A   Occupational History  . Retired    Social History Main Topics  . Smoking status: Never Smoker   . Smokeless tobacco: Not on file  . Alcohol Use: No  . Drug Use: No  . Sexual Activity: No   Other Topics Concern  . Not on file   Social History Narrative   Pt lives alone but dtr lives on property and has been staying with her since surgery.     Constitutional: Pt reports fatigue.  Denies fever, malaise,  headache or abrupt weight changes.  HEENT: Pt reports nasal congestion. Denies eye pain, eye redness, ear pain, ringing in the ears, wax buildup, runny nose, bloody nose, or sore throat. Respiratory: Pt reports cough. Denies difficulty breathing, shortness of breath.   Cardiovascular: Denies chest pain, chest tightness, palpitations or swelling in the hands or feet.    No other specific complaints in a complete review of systems (except as listed in HPI above).  Objective:   Physical Exam   BP 120/74  Pulse 63  Temp(Src) 97.3 F (36.3 C) (Oral)  Wt 196 lb 8 oz (89.132 kg)  SpO2 96% Wt Readings from Last 3 Encounters:  11/24/13 196 lb 8 oz (89.132 kg)  11/18/13 198 lb 8 oz (90.039 kg)  09/29/13 199 lb (90.266 kg)    General: Appears her stated age, well developed, well nourished in NAD. HEENT: Head: normal shape and size; Eyes: sclera white, no icterus, conjunctiva pink, PERRLA and EOMs intact; Ears: Tm's gray and intact, normal light reflex;  Nose: mucosa pink and moist, septum midline; Throat/Mouth: Teeth present, mucosa pink and moist, no exudate, lesions or ulcerations noted.  Neck: Normal range of motion. Neck supple, trachea midline. No massses, lumps or thyromegaly present.  Cardiovascular: Normal rate and rhythm. S1,S2 noted.  No murmur, rubs or gallops noted. No JVD or BLE edema. No carotid bruits noted. Pulmonary/Chest: Normal effort and still coarse in the bases. No respiratory distress. No wheezes, rales  noted.   BMET    Component Value Date/Time   NA 139 09/29/2013 1837   K 3.7 09/29/2013 1837   CL 100 09/29/2013 1837   CO2 26 09/29/2013 1837   GLUCOSE 103* 09/29/2013 1837   BUN 21 09/29/2013 1837   CREATININE 0.85 09/29/2013 1837   CREATININE 0.85 12/03/2012 1736   CALCIUM 9.6 09/29/2013 1837   GFRNONAA 64* 09/29/2013 1837   GFRAA 75* 09/29/2013 1837    Lipid Panel     Component Value Date/Time   CHOL 153 02/01/2013 0822   TRIG 90.0 02/01/2013 0822   HDL 49.10 02/01/2013 0822   CHOLHDL 3 02/01/2013 0822   VLDL 18.0 02/01/2013 0822   LDLCALC 86 02/01/2013 0822    CBC    Component Value Date/Time   WBC 5.1 09/29/2013 1837   RBC 4.77 09/29/2013 1837   HGB 15.1* 09/29/2013 1837   HCT 45.5 09/29/2013 1837   PLT 225 09/29/2013 1837   MCV 95.4 09/29/2013 1837   MCH 31.7 09/29/2013 1837   MCHC 33.2 09/29/2013 1837   RDW 13.1 09/29/2013 1837   LYMPHSABS 1.4 02/01/2013 0822   MONOABS 0.6 02/01/2013 0822   EOSABS 0.2 02/01/2013 0822   BASOSABS 0.0 02/01/2013 0822    Hgb A1C No results found for this basename: HGBA1C        Assessment & Plan:   Acute bronchitis with worsening cough:  Will obtain chest xray to make sure we are not missing a  Pneumonia May need another abx but we will wait for the chest xray results eRx for tessalon pearls during the day, robitussin at night  Will follow up after results come back, to ER if worse

## 2013-11-24 NOTE — Patient Instructions (Signed)

## 2013-11-24 NOTE — Progress Notes (Signed)
Pre-visit discussion using our clinic review tool. No additional management support is needed unless otherwise documented below in the visit note.  

## 2013-12-06 ENCOUNTER — Encounter: Payer: Self-pay | Admitting: Family Medicine

## 2013-12-06 ENCOUNTER — Ambulatory Visit (INDEPENDENT_AMBULATORY_CARE_PROVIDER_SITE_OTHER): Payer: Medicare Other | Admitting: Family Medicine

## 2013-12-06 VITALS — BP 106/64 | HR 50 | Temp 97.8°F | Ht 66.0 in | Wt 194.0 lb

## 2013-12-06 DIAGNOSIS — A084 Viral intestinal infection, unspecified: Secondary | ICD-10-CM | POA: Insufficient documentation

## 2013-12-06 DIAGNOSIS — A088 Other specified intestinal infections: Secondary | ICD-10-CM

## 2013-12-06 NOTE — Progress Notes (Signed)
Pre-visit discussion using our clinic review tool. No additional management support is needed unless otherwise documented below in the visit note.  

## 2013-12-06 NOTE — Progress Notes (Signed)
Subjective:    Patient ID: [name], female    DOB: [birth year], 80 y.o.   MRN: [id number]  HPI Here for diarrhea   Started on Friday - has not eaten anything unusual  (boyfriend had a stomach virus 2 d before)  Vomiting and then diarrhea  No abd pain- just cramping with bm  No fever / no chills or aches - just felt lousy  Much better today  Last diarrhea episode was last night   Is able to drink fluids - tea and gatorade and water  Ate some dry toast    Patient Active Problem List   Diagnosis Date Noted  . Solitary pulmonary nodule 05/03/2013  . Hypoxia 04/26/2013  . Sternal fracture 04/26/2013  . Atypical chest pain 04/26/2013  . Encounter for Medicare annual wellness exam 01/26/2013  . Right ventricular failure 12/09/2012  . PE (pulmonary embolism) 11/24/2012  . DVT (deep venous thrombosis) 11/24/2012  . Pulmonary hypertension, moderate to severe 11/24/2012  . Painful total knee replacement, dislocating patella 01/12/2012  . PES PLANUS 08/15/2010  . URINARY FREQUENCY, CHRONIC 07/14/2008  . HYPERCHOLESTEROLEMIA, PURE 07/09/2007  . HYPERTENSION, ESSENTIAL NOS 07/09/2007  . PSVT 02/23/2007  . SCLERODERMA 02/23/2007  . DEGENERATIVE JOINT DISEASE 02/23/2007  . SPINAL STENOSIS, LUMBAR 02/23/2007  . OSTEOPENIA 02/23/2007  . SPONDYLOLISTHESIS 02/23/2007   Past Medical History  Diagnosis Date  . Osteopenia   . Hyperlipidemia   . Hypertension   . PSVT (paroxysmal supraventricular tachycardia)   . Spinal stenosis   . Overactive bladder     urge incontinence  . Pulmonary emboli   . Pulmonary hypertension   . Right ventricular failure    Past Surgical History  Procedure Laterality Date  . Cataract extraction  2008  . Bladder suspension  1993  . Joint replacement  2000    left TKR  . Total knee revision  01/12/2012    Procedure: TOTAL KNEE REVISION;  Surgeon: Kerin Salen, MD;  Location: [place];  Service: Orthopedics;  Laterality: Left;  DEPUY/ LCS , HAND SET    . Trigger finger release  01/12/2012    Procedure: RELEASE TRIGGER FINGER/A-1 PULLEY;  Surgeon: Kerin Salen, MD;  Location: [place];  Service: Orthopedics;  Laterality: Right;  . Cholecystectomy    . Hernia repair    . Total knee arthroplasty  10/29/2012    at [facility]   History  Substance Use Topics  . Smoking status: Never Smoker   . Smokeless tobacco: Not on file  . Alcohol Use: No   Family History  Problem Relation Age of Onset  . Cancer Mother     breast CA  . Heart disease Father     MI  . Diabetes Father   . Hypertension Father   . Cancer Sister     uterine CA   Allergies  Allergen Reactions  . Adhesive [Tape] Other (See Comments)    redness  . Penicillins Hives  . Sulfonamide Derivatives Hives   Current Outpatient Prescriptions on File Prior to Visit  Medication Sig Dispense Refill  . benzonatate (TESSALON) 200 MG capsule Take 1 capsule (200 mg total) by mouth 2 (two) times daily as needed for cough.  20 capsule  0  . Calcium Carbonate-Vitamin D (CALCIUM-VITAMIN D) 600-200 MG-UNIT CAPS Take 1 capsule by mouth 2 (two) times daily.       . cholecalciferol (VITAMIN D) 1000 UNITS tablet Take 1,000 Units by mouth 2 (two) times daily.       Marland Kitchen  diphenhydrAMINE (BENADRYL) 25 mg capsule Take 25 mg by mouth daily.      . hydrochlorothiazide (HYDRODIURIL) 25 MG tablet Take 12.5 mg by mouth daily.      . Multiple Vitamin (MULTIVITAMIN WITH MINERALS) TABS tablet Take 1 tablet by mouth daily.      . polyethylene glycol (MIRALAX / GLYCOLAX) packet Take 8.5 g by mouth daily.       . Rivaroxaban (XARELTO) 20 MG TABS tablet Take 1 tablet (20 mg total) by mouth daily.  90 tablet  3  . verapamil (CALAN-SR) 120 MG CR tablet Take 1 tablet (120 mg total) by mouth at bedtime.  30 tablet  3   No current facility-administered medications on file prior to visit.      Had just had bronchitis -still a little cough    Review of Systems Review of Systems  Constitutional: Negative for fever,  appetite change, and unexpected weight change. pos for generalized weakness  Eyes: Negative for pain and visual disturbance.  Respiratory: Negative for cough and shortness of breath.   Cardiovascular: Negative for cp or palpitations    Gastrointestinal: Negative for constipation/ blood in stool/ dark stool or abd pain  Genitourinary: Negative for urgency and frequency.  Skin: Negative for pallor or rash   Neurological: Negative for weakness, light-headedness, numbness and headaches.  Hematological: Negative for adenopathy. Does not bruise/bleed easily.  Psychiatric/Behavioral: Negative for dysphoric mood. The patient is not nervous/anxious.         Objective:   Physical Exam  Constitutional: She appears well-developed and well-nourished. No distress.  HENT:  Head: Normocephalic and atraumatic.  Mouth/Throat: Oropharynx is clear and moist.  Eyes: Conjunctivae and EOM are normal. Pupils are equal, round, and reactive to light. No scleral icterus.  Neck: Normal range of motion. Neck supple.  Cardiovascular: Normal rate and regular rhythm.   Pulmonary/Chest: Effort normal and breath sounds normal. No respiratory distress. She has no wheezes. She has no rales.  Abdominal: Soft. Bowel sounds are normal. She exhibits no distension and no mass. There is no tenderness. There is no rebound and no guarding.  bs are overactive but not high pitched  Musculoskeletal: She exhibits no edema.  Lymphadenopathy:    She has no cervical adenopathy.  Neurological: She is alert.  Skin: Skin is warm and dry. No rash noted. No pallor.  Nl skin color and turgor  Brisk capillary refill   Psychiatric: She has a normal mood and affect.          Assessment & Plan:

## 2013-12-06 NOTE — Patient Instructions (Signed)
I think you have had a stomach virus- that is getting better  Push fluids  Also stick to BRAT diet (banana/ rice/apple sauce and toast)- until you feel totally better  Update me if symptoms worsen again    Viral Gastroenteritis Viral gastroenteritis is also known as stomach flu. This condition affects the stomach and intestinal tract. It can cause sudden diarrhea and vomiting. The illness typically lasts 3 to 8 days. Most people develop an immune response that eventually gets rid of the virus. While this natural response develops, the virus can make you quite ill. CAUSES  Many different viruses can cause gastroenteritis, such as rotavirus or noroviruses. You can catch one of these viruses by consuming contaminated food or water. You may also catch a virus by sharing utensils or other personal items with an infected person or by touching a contaminated surface. SYMPTOMS  The most common symptoms are diarrhea and vomiting. These problems can cause a severe loss of body fluids (dehydration) and a body salt (electrolyte) imbalance. Other symptoms may include:  Fever.  Headache.  Fatigue.  Abdominal pain. DIAGNOSIS  Your caregiver can usually diagnose viral gastroenteritis based on your symptoms and a physical exam. A stool sample may also be taken to test for the presence of viruses or other infections. TREATMENT  This illness typically goes away on its own. Treatments are aimed at rehydration. The most serious cases of viral gastroenteritis involve vomiting so severely that you are not able to keep fluids down. In these cases, fluids must be given through an intravenous line (IV). HOME CARE INSTRUCTIONS   Drink enough fluids to keep your urine clear or pale yellow. Drink small amounts of fluids frequently and increase the amounts as tolerated.  Ask your caregiver for specific rehydration instructions.  Avoid:  Foods high in sugar.  Alcohol.  Carbonated  drinks.  Tobacco.  Juice.  Caffeine drinks.  Extremely hot or cold fluids.  Fatty, greasy foods.  Too much intake of anything at one time.  Dairy products until 24 to 48 hours after diarrhea stops.  You may consume probiotics. Probiotics are active cultures of beneficial bacteria. They may lessen the amount and number of diarrheal stools in adults. Probiotics can be found in yogurt with active cultures and in supplements.  Wash your hands well to avoid spreading the virus.  Only take over-the-counter or prescription medicines for pain, discomfort, or fever as directed by your caregiver. Do not give aspirin to children. Antidiarrheal medicines are not recommended.  Ask your caregiver if you should continue to take your regular prescribed and over-the-counter medicines.  Keep all follow-up appointments as directed by your caregiver. SEEK IMMEDIATE MEDICAL CARE IF:   You are unable to keep fluids down.  You do not urinate at least once every 6 to 8 hours.  You develop shortness of breath.  You notice blood in your stool or vomit. This may look like coffee grounds.  You have abdominal pain that increases or is concentrated in one small area (localized).  You have persistent vomiting or diarrhea.  You have a fever.  The patient is a child younger than 3 months, and he or she has a fever.  The patient is a child older than 3 months, and he or she has a fever and persistent symptoms.  The patient is a child older than 3 months, and he or she has a fever and symptoms suddenly get worse.  The patient is a baby, and he or she has  no tears when crying. MAKE SURE YOU:   Understand these instructions.  Will watch your condition.  Will get help right away if you are not doing well or get worse. Document Released: 10/13/2005 Document Revised: 01/05/2012 Document Reviewed: 07/30/2011 [facility] Patient Information 2014 [place].

## 2013-12-07 NOTE — Assessment & Plan Note (Signed)
Overall getting better No s/s of dehydration-disc what to watch for  Enc fluids and BrAT diet when ready Handout given Update if not starting to improve in a week or if worsening

## 2014-01-11 ENCOUNTER — Other Ambulatory Visit: Payer: Self-pay

## 2014-01-11 MED ORDER — VERAPAMIL HCL ER 120 MG PO TBCR: 120.0000 mg | EXTENDED_RELEASE_TABLET | Freq: Every day | ORAL | Status: DC

## 2014-01-19 ENCOUNTER — Other Ambulatory Visit: Payer: Self-pay | Admitting: *Deleted

## 2014-01-19 MED ORDER — METOPROLOL TARTRATE 50 MG PO TABS: 25.0000 mg | ORAL_TABLET | Freq: Every day | ORAL | Status: DC

## 2014-02-22 ENCOUNTER — Other Ambulatory Visit: Payer: Self-pay

## 2014-02-23 ENCOUNTER — Ambulatory Visit (INDEPENDENT_AMBULATORY_CARE_PROVIDER_SITE_OTHER): Payer: Medicare Other | Admitting: Internal Medicine

## 2014-02-23 ENCOUNTER — Telehealth: Payer: Self-pay | Admitting: Internal Medicine

## 2014-02-23 ENCOUNTER — Encounter: Payer: Self-pay | Admitting: Internal Medicine

## 2014-02-23 VITALS — BP 120/80 | HR 61 | Ht 66.0 in | Wt 204.0 lb

## 2014-02-23 DIAGNOSIS — I471 Supraventricular tachycardia, unspecified: Secondary | ICD-10-CM

## 2014-02-23 DIAGNOSIS — I272 Pulmonary hypertension, unspecified: Secondary | ICD-10-CM

## 2014-02-23 DIAGNOSIS — I2789 Other specified pulmonary heart diseases: Secondary | ICD-10-CM

## 2014-02-23 DIAGNOSIS — I2699 Other pulmonary embolism without acute cor pulmonale: Secondary | ICD-10-CM

## 2014-02-23 DIAGNOSIS — Z01810 Encounter for preprocedural cardiovascular examination: Secondary | ICD-10-CM

## 2014-02-23 NOTE — Patient Instructions (Addendum)
Your physician wants you to follow-up in: 6 months with Dr Vallery Ridge will receive a reminder letter in the mail two months in advance. If you don't receive a letter, please call our office to schedule the follow-up appointment.  Call back [phone] and let the poerator know if you are taking Metoprolol or Atenolol and what the dose is   Your physician has requested that you have an echocardiogram. Echocardiography is a painless test that uses sound waves to create images of your heart. It provides your doctor with information about the size and shape of your heart and how well your heart's chambers and valves are working. This procedure takes approximately one hour. There are no restrictions for this procedure.

## 2014-02-23 NOTE — Progress Notes (Signed)
PCP:  Loura Pardon, MD  The patient presents today for cardiology followup.  Since her last visit, she has done reasonably well.   Last visit, I stopped her metoprolol.  She quickly had recurrence of SVT for which she presented to [facility] ER.  She was restarted on her beta blocker and has had no further SVT.   Today, she denies symptoms of exertional chest pain,SOB (above baseline), orthopnea, PND, dizziness, presyncope, syncope, or neurologic sequela.  The patient feels that she is tolerating medications without difficulties and is otherwise without complaint today.   Past Medical History  Diagnosis Date  . Osteopenia   . Hyperlipidemia   . Hypertension   . PSVT (paroxysmal supraventricular tachycardia)   . Spinal stenosis   . Overactive bladder     urge incontinence  . Pulmonary emboli   . Pulmonary hypertension   . Right ventricular failure    Past Surgical History  Procedure Laterality Date  . Cataract extraction  2008  . Bladder suspension  1993  . Joint replacement  2000    left TKR  . Total knee revision  01/12/2012    Procedure: TOTAL KNEE REVISION;  Surgeon: Kerin Salen, MD;  Location: [place];  Service: Orthopedics;  Laterality: Left;  DEPUY/ LCS , HAND SET  . Trigger finger release  01/12/2012    Procedure: RELEASE TRIGGER FINGER/A-1 PULLEY;  Surgeon: Kerin Salen, MD;  Location: [place];  Service: Orthopedics;  Laterality: Right;  . Cholecystectomy    . Hernia repair    . Total knee arthroplasty  10/29/2012    at [facility]    Current Outpatient Prescriptions  Medication Sig Dispense Refill  . acetaminophen (TYLENOL) 500 MG tablet Take 500 mg by mouth as needed.      Marland Kitchen atenolol (TENORMIN) 50 MG tablet Take 50 mg by mouth daily. Pt unsure is she is taking this medication (02/23/14)      . Calcium Carbonate-Vitamin D (CALCIUM-VITAMIN D) 600-200 MG-UNIT CAPS Take 1 capsule by mouth 2 (two) times daily.       . cholecalciferol (VITAMIN D) 1000 UNITS tablet Take 1,000 Units by  mouth 2 (two) times daily.       . diphenhydrAMINE (BENADRYL) 25 mg capsule Take 25 mg by mouth daily.      . hydrochlorothiazide (HYDRODIURIL) 25 MG tablet Take 25 mg by mouth daily.       . metoprolol (LOPRESSOR) 50 MG tablet Take 0.5 tablets (25 mg total) by mouth daily.  45 tablet  1  . Multiple Vitamin (MULTIVITAMIN WITH MINERALS) TABS tablet Take 1 tablet by mouth daily.      . naproxen sodium (ANAPROX) 220 MG tablet Take 440 mg by mouth daily.      . polyethylene glycol (MIRALAX / GLYCOLAX) packet Take 8.5 g by mouth daily.       . Rivaroxaban (XARELTO) 20 MG TABS tablet Take 1 tablet (20 mg total) by mouth daily.  90 tablet  3  . traMADol (ULTRAM) 50 MG tablet Take 50 mg by mouth every 6 (six) hours as needed.      . verapamil (CALAN-SR) 120 MG CR tablet Take 1 tablet (120 mg total) by mouth at bedtime.  90 tablet  3   No current facility-administered medications for this visit.    Allergies  Allergen Reactions  . Adhesive [Tape] Other (See Comments)    redness  . Penicillins Hives  . Prednisone Other (See Comments)    Causes insomnia  .  Sulfonamide Derivatives Hives    History   Social History  . Marital Status: Married    Spouse Name: N/A    Number of Children: N/A  . Years of Education: N/A   Occupational History  . Retired    Social History Main Topics  . Smoking status: Never Smoker   . Smokeless tobacco: Not on file  . Alcohol Use: No  . Drug Use: No  . Sexual Activity: No   Other Topics Concern  . Not on file   Social History Narrative   Pt lives alone but dtr lives on property and has been staying with her since surgery.    Family History  Problem Relation Age of Onset  . Cancer Mother     breast CA  . Heart disease Father     MI  . Diabetes Father   . Hypertension Father   . Cancer Sister     uterine CA    Physical Exam: Filed Vitals:   02/23/14 1357  BP: 120/80  Pulse: 61  Height: 5\' 6"  (1.676 m)  Weight: 204 lb (92.534 kg)     GEN- The patient is elderly appearing, alert and oriented x 3 today.   Head- normocephalic, atraumatic Eyes-  Sclera clear, conjunctiva pink Ears- hearing intact Oropharynx- clear Neck- supple, JVP 9cm Lungs- Clear to ausculation bilaterally, normal work of breathing Heart- Regular rate and rhythm, no murmurs, rubs or gallops, PMI not laterally displaced GI- soft, NT, ND, + BS Extremities- no clubbing, cyanosis, edema is resolved Neuro- strength and sensation are intact  ekg today reveals sinus rhythm 57 bpm, PR 196, QRS 88, QTc 416, otherwise normal ekg  Assessment and Plan:  1. PTE Doing well with xarelto No changes today Echo to evaluate pulmonary pressures (this was not performed as ordered last visit)  2. SVT Well controlled with beta blockers Presently she is not sure if she in on atenolol or metoprolol.  (her list includes both).  I have encouraged her to call our office back to let us know what she is taking.  No changes for now to what she is taking. She does not wish to consider ablation.  She says she is having knee surgery in a few weeks.  She might be willing to consider ablation next visit.  3. Pulmonary hypertension Likely due to PTE Repeat echo upon return to see Richardson Dopp in 6 months  4. Preoperative clearance She is scheduled for knee surgery in two weeks. OK to proceed from a CV standpoint without further evaluation. She should continue her beta blocker perioperatively for SVT management. Hold xarelto for 24 hours prior to surgery and restart immediately afterwards if not contraindicated  Return to see me in 6 months If she has further SVT then she should call in the interim

## 2014-02-23 NOTE — Telephone Encounter (Signed)
New message     Saw Dr Rayann Heman today-- Pt is taking metoprolol 50mg ----1/2 tab daily

## 2014-03-02 ENCOUNTER — Telehealth: Payer: Self-pay | Admitting: Internal Medicine

## 2014-03-02 NOTE — Telephone Encounter (Signed)
Forwarding request to hold Xarelto for 72 hours prior to surgery on 5/14 to Dr. Rayann Heman.  Per Dr. Jackalyn Lombard note on 4/30 he approved patient to hold Xarelto for 24 hours.

## 2014-03-02 NOTE — Telephone Encounter (Signed)
New message    Clinic notes on  4/30 stopping eliquis for 1 days.      However, patient need to stop eliquis 72 hours prior to surgery on  5/14.

## 2014-03-03 NOTE — Telephone Encounter (Signed)
Dr Rayann Heman approved for 24 hours prior.  This is the recommendation.

## 2014-03-09 DIAGNOSIS — M1710 Unilateral primary osteoarthritis, unspecified knee: Secondary | ICD-10-CM | POA: Insufficient documentation

## 2014-03-09 HISTORY — PX: TOTAL KNEE ARTHROPLASTY: SHX125

## 2014-03-13 ENCOUNTER — Encounter (HOSPITAL_COMMUNITY): Payer: Self-pay | Admitting: Emergency Medicine

## 2014-03-13 ENCOUNTER — Emergency Department (HOSPITAL_COMMUNITY): Payer: Medicare Other

## 2014-03-13 ENCOUNTER — Inpatient Hospital Stay (HOSPITAL_COMMUNITY)
Admission: EM | Admit: 2014-03-13 | Discharge: 2014-03-16 | DRG: 689 | Disposition: A | Discharge status: Skilled Nursing Facility | Payer: Medicare Other | Attending: Internal Medicine | Admitting: Internal Medicine

## 2014-03-13 DIAGNOSIS — N390 Urinary tract infection, site not specified: Principal | ICD-10-CM | POA: Diagnosis present

## 2014-03-13 DIAGNOSIS — Z8249 Family history of ischemic heart disease and other diseases of the circulatory system: Secondary | ICD-10-CM

## 2014-03-13 DIAGNOSIS — Q762 Congenital spondylolisthesis: Secondary | ICD-10-CM

## 2014-03-13 DIAGNOSIS — M949 Disorder of cartilage, unspecified: Secondary | ICD-10-CM

## 2014-03-13 DIAGNOSIS — S2220XA Unspecified fracture of sternum, initial encounter for closed fracture: Secondary | ICD-10-CM

## 2014-03-13 DIAGNOSIS — Z8049 Family history of malignant neoplasm of other genital organs: Secondary | ICD-10-CM

## 2014-03-13 DIAGNOSIS — G9340 Encephalopathy, unspecified: Secondary | ICD-10-CM

## 2014-03-13 DIAGNOSIS — E7800 Pure hypercholesterolemia, unspecified: Secondary | ICD-10-CM | POA: Diagnosis present

## 2014-03-13 DIAGNOSIS — Z7901 Long term (current) use of anticoagulants: Secondary | ICD-10-CM

## 2014-03-13 DIAGNOSIS — N318 Other neuromuscular dysfunction of bladder: Secondary | ICD-10-CM | POA: Diagnosis present

## 2014-03-13 DIAGNOSIS — M349 Systemic sclerosis, unspecified: Secondary | ICD-10-CM

## 2014-03-13 DIAGNOSIS — I4891 Unspecified atrial fibrillation: Secondary | ICD-10-CM | POA: Diagnosis present

## 2014-03-13 DIAGNOSIS — Z9889 Other specified postprocedural states: Secondary | ICD-10-CM

## 2014-03-13 DIAGNOSIS — I272 Pulmonary hypertension, unspecified: Secondary | ICD-10-CM

## 2014-03-13 DIAGNOSIS — I2699 Other pulmonary embolism without acute cor pulmonale: Secondary | ICD-10-CM | POA: Diagnosis present

## 2014-03-13 DIAGNOSIS — A084 Viral intestinal infection, unspecified: Secondary | ICD-10-CM

## 2014-03-13 DIAGNOSIS — M48061 Spinal stenosis, lumbar region without neurogenic claudication: Secondary | ICD-10-CM

## 2014-03-13 DIAGNOSIS — I471 Supraventricular tachycardia, unspecified: Secondary | ICD-10-CM | POA: Diagnosis present

## 2014-03-13 DIAGNOSIS — Z9849 Cataract extraction status, unspecified eye: Secondary | ICD-10-CM

## 2014-03-13 DIAGNOSIS — I2789 Other specified pulmonary heart diseases: Secondary | ICD-10-CM | POA: Diagnosis present

## 2014-03-13 DIAGNOSIS — G92 Toxic encephalopathy: Secondary | ICD-10-CM | POA: Diagnosis present

## 2014-03-13 DIAGNOSIS — Z01810 Encounter for preprocedural cardiovascular examination: Secondary | ICD-10-CM

## 2014-03-13 DIAGNOSIS — E860 Dehydration: Secondary | ICD-10-CM | POA: Diagnosis present

## 2014-03-13 DIAGNOSIS — Z86711 Personal history of pulmonary embolism: Secondary | ICD-10-CM

## 2014-03-13 DIAGNOSIS — R000 Tachycardia, unspecified: Secondary | ICD-10-CM

## 2014-03-13 DIAGNOSIS — I495 Sick sinus syndrome: Secondary | ICD-10-CM | POA: Diagnosis present

## 2014-03-13 DIAGNOSIS — Z0000 Encounter for general adult medical examination without abnormal findings: Secondary | ICD-10-CM

## 2014-03-13 DIAGNOSIS — N3941 Urge incontinence: Secondary | ICD-10-CM | POA: Diagnosis present

## 2014-03-13 DIAGNOSIS — Z833 Family history of diabetes mellitus: Secondary | ICD-10-CM

## 2014-03-13 DIAGNOSIS — R0902 Hypoxemia: Secondary | ICD-10-CM

## 2014-03-13 DIAGNOSIS — M1990 Unspecified osteoarthritis, unspecified site: Secondary | ICD-10-CM | POA: Diagnosis present

## 2014-03-13 DIAGNOSIS — I10 Essential (primary) hypertension: Secondary | ICD-10-CM | POA: Diagnosis present

## 2014-03-13 DIAGNOSIS — G929 Unspecified toxic encephalopathy: Secondary | ICD-10-CM | POA: Diagnosis present

## 2014-03-13 DIAGNOSIS — I82409 Acute embolism and thrombosis of unspecified deep veins of unspecified lower extremity: Secondary | ICD-10-CM

## 2014-03-13 DIAGNOSIS — R350 Frequency of micturition: Secondary | ICD-10-CM

## 2014-03-13 DIAGNOSIS — Z66 Do not resuscitate: Secondary | ICD-10-CM | POA: Diagnosis present

## 2014-03-13 DIAGNOSIS — M4800 Spinal stenosis, site unspecified: Secondary | ICD-10-CM | POA: Diagnosis present

## 2014-03-13 DIAGNOSIS — M2140 Flat foot [pes planus] (acquired), unspecified foot: Secondary | ICD-10-CM

## 2014-03-13 DIAGNOSIS — I50810 Right heart failure, unspecified: Secondary | ICD-10-CM

## 2014-03-13 DIAGNOSIS — R4182 Altered mental status, unspecified: Secondary | ICD-10-CM | POA: Diagnosis present

## 2014-03-13 DIAGNOSIS — M899 Disorder of bone, unspecified: Secondary | ICD-10-CM

## 2014-03-13 DIAGNOSIS — T8484XA Pain due to internal orthopedic prosthetic devices, implants and grafts, initial encounter: Secondary | ICD-10-CM

## 2014-03-13 DIAGNOSIS — R410 Disorientation, unspecified: Secondary | ICD-10-CM

## 2014-03-13 DIAGNOSIS — Z96651 Presence of right artificial knee joint: Secondary | ICD-10-CM

## 2014-03-13 DIAGNOSIS — R0789 Other chest pain: Secondary | ICD-10-CM

## 2014-03-13 DIAGNOSIS — Z96659 Presence of unspecified artificial knee joint: Secondary | ICD-10-CM

## 2014-03-13 DIAGNOSIS — E785 Hyperlipidemia, unspecified: Secondary | ICD-10-CM | POA: Diagnosis present

## 2014-03-13 DIAGNOSIS — Z803 Family history of malignant neoplasm of breast: Secondary | ICD-10-CM

## 2014-03-13 DIAGNOSIS — R911 Solitary pulmonary nodule: Secondary | ICD-10-CM

## 2014-03-13 LAB — CBC WITH DIFFERENTIAL/PLATELET
BASOS PCT: 0 % (ref 0–1)
Basophils Absolute: 0 10*3/uL (ref 0.0–0.1)
EOS ABS: 0 10*3/uL (ref 0.0–0.7)
Eosinophils Relative: 0 % (ref 0–5)
HEMATOCRIT: 36.6 % (ref 36.0–46.0)
HEMOGLOBIN: 12.3 g/dL (ref 12.0–15.0)
LYMPHS ABS: 0.7 10*3/uL (ref 0.7–4.0)
Lymphocytes Relative: 8 % — ABNORMAL LOW (ref 12–46)
MCH: 31.5 pg (ref 26.0–34.0)
MCHC: 33.6 g/dL (ref 30.0–36.0)
MCV: 93.6 fL (ref 78.0–100.0)
MONOS PCT: 8 % (ref 3–12)
Monocytes Absolute: 0.7 10*3/uL (ref 0.1–1.0)
Neutro Abs: 6.5 10*3/uL (ref 1.7–7.7)
Neutrophils Relative %: 84 % — ABNORMAL HIGH (ref 43–77)
Platelets: 256 10*3/uL (ref 150–400)
RBC: 3.91 MIL/uL (ref 3.87–5.11)
RDW: 13.4 % (ref 11.5–15.5)
WBC: 7.8 10*3/uL (ref 4.0–10.5)

## 2014-03-13 LAB — URINALYSIS, ROUTINE W REFLEX MICROSCOPIC
Bilirubin Urine: NEGATIVE
Glucose, UA: NEGATIVE mg/dL
KETONES UR: 40 mg/dL — AB
Leukocytes, UA: NEGATIVE
NITRITE: POSITIVE — AB
Protein, ur: 100 mg/dL — AB
Specific Gravity, Urine: 1.034 — ABNORMAL HIGH (ref 1.005–1.030)
UROBILINOGEN UA: 1 mg/dL (ref 0.0–1.0)
pH: 5.5 (ref 5.0–8.0)

## 2014-03-13 LAB — I-STAT CG4 LACTIC ACID, ED: Lactic Acid, Venous: 1.43 mmol/L (ref 0.5–2.2)

## 2014-03-13 LAB — URINE MICROSCOPIC-ADD ON

## 2014-03-13 LAB — BASIC METABOLIC PANEL
BUN: 26 mg/dL — AB (ref 6–23)
CO2: 26 mEq/L (ref 19–32)
CREATININE: 0.72 mg/dL (ref 0.50–1.10)
Calcium: 8.4 mg/dL (ref 8.4–10.5)
Chloride: 96 mEq/L (ref 96–112)
GFR calc Af Amer: >90 mL/min (ref >90)
GFR, EST NON AFRICAN AMERICAN: 80 mL/min — AB (ref >90)
GLUCOSE: 124 mg/dL — AB (ref 70–99)
Potassium: 3.8 mEq/L (ref 3.7–5.3)
Sodium: 134 mEq/L — ABNORMAL LOW (ref 137–147)

## 2014-03-13 MED ORDER — SODIUM CHLORIDE 0.9 % IV BOLUS (SEPSIS)
500.0000 mL | Freq: Once | INTRAVENOUS | Status: AC
  Administered 2014-03-13: 500 mL via INTRAVENOUS

## 2014-03-13 MED ORDER — DILTIAZEM HCL 25 MG/5ML IV SOLN
10.0000 mg | Freq: Once | INTRAVENOUS | Status: AC
  Administered 2014-03-13: 10 mg via INTRAVENOUS
  Filled 2014-03-13: qty 5

## 2014-03-13 MED ORDER — DEXTROSE 5 % IV SOLN
1.0000 g | Freq: Once | INTRAVENOUS | Status: AC
  Administered 2014-03-13: 1 g via INTRAVENOUS
  Filled 2014-03-13: qty 10

## 2014-03-13 MED ORDER — IOHEXOL 350 MG/ML SOLN
100.0000 mL | Freq: Once | INTRAVENOUS | Status: AC | PRN
  Administered 2014-03-13: 100 mL via INTRAVENOUS

## 2014-03-13 NOTE — ED Notes (Signed)
Per ems-- pt from [facility]. Was just admitted there from [facility] post R knee replacement today. facility called ems for uncontrolled a-fib. Pt appears to be sinus tach with ems rate 140. ems administered 600cc NS. Pt in process of having ablation scheduled. Pt denies cp, palpitations, sob. Pt has DNR. facility administered lopressor at 5pm.

## 2014-03-13 NOTE — ED Provider Notes (Signed)
CSN: [id number]     Arrival date & time 03/13/14  1907 History   First MD Initiated Contact with Patient 03/13/14 1945     Chief Complaint  Patient presents with  . Tachycardia     (Consider location/radiation/quality/duration/timing/severity/associated sxs/prior Treatment) HPI Comments: 80 year old female with history of high blood pressure, cholesterol, degenerative joint disease, pulmonary embolism, DVT and recent total right knee replacement this past Thursday presents from rehabilitation with tachycardia. Per report patient was in A. fib with RVR. Patient has general fatigue since surgery and right knee pain with movement since surgery however denies any shortness of breath, headache, urinary symptoms, fevers or chills. Patient was on Xarelto prior to surgery but has not restarted since surgery.  The history is provided by the patient.    Past Medical History  Diagnosis Date  . Osteopenia   . Hyperlipidemia   . Hypertension   . PSVT (paroxysmal supraventricular tachycardia)   . Spinal stenosis   . Overactive bladder     urge incontinence  . Pulmonary emboli   . Pulmonary hypertension   . Right ventricular failure    Past Surgical History  Procedure Laterality Date  . Cataract extraction  2008  . Bladder suspension  1993  . Joint replacement  2000    left TKR  . Total knee revision  01/12/2012    Procedure: TOTAL KNEE REVISION;  Surgeon: Kerin Salen, MD;  Location: [place];  Service: Orthopedics;  Laterality: Left;  DEPUY/ LCS , HAND SET  . Trigger finger release  01/12/2012    Procedure: RELEASE TRIGGER FINGER/A-1 PULLEY;  Surgeon: Kerin Salen, MD;  Location: [place];  Service: Orthopedics;  Laterality: Right;  . Cholecystectomy    . Hernia repair    . Total knee arthroplasty  10/29/2012    at [facility]   Family History  Problem Relation Age of Onset  . Cancer Mother     breast CA  . Heart disease Father     MI  . Diabetes Father   . Hypertension Father   . Cancer  Sister     uterine CA   History  Substance Use Topics  . Smoking status: Never Smoker   . Smokeless tobacco: Not on file  . Alcohol Use: No   OB History   Grav Para Term Preterm Abortions TAB SAB Ect Mult Living                 Review of Systems  Constitutional: Positive for fatigue. Negative for fever and chills.  HENT: Negative for congestion.   Eyes: Negative for visual disturbance.  Respiratory: Negative for shortness of breath.   Cardiovascular: Positive for leg swelling (similar bilateral lower extremity since surgery). Negative for chest pain.  Gastrointestinal: Negative for vomiting and abdominal pain.  Genitourinary: Negative for dysuria and flank pain.  Musculoskeletal: Positive for arthralgias. Negative for back pain, neck pain and neck stiffness.  Skin: Negative for rash.  Neurological: Negative for light-headedness and headaches.  Psychiatric/Behavioral: Positive for hallucinations.      Allergies  Penicillins; Prednisone; Sulfonamide derivatives; and Adhesive  Home Medications   Prior to Admission medications   Medication Sig Start Date End Date Taking? Authorizing Provider  CALCIUM-VITAMIN D PO Take 1 tablet by mouth 3 (three) times daily.   Yes Historical Provider, MD  hydrochlorothiazide (HYDRODIURIL) 25 MG tablet Take 25 mg by mouth daily.    Yes Historical Provider, MD  metoprolol (LOPRESSOR) 50 MG tablet Take 0.5 tablets (25 mg  total) by mouth daily. 01/19/14  Yes Thompson Grayer, MD  Multiple Vitamin (MULTIVITAMIN WITH MINERALS) TABS tablet Take 1 tablet by mouth daily.   Yes Historical Provider, MD  polyethylene glycol (MIRALAX / GLYCOLAX) packet Take 17 g by mouth daily.    Yes Historical Provider, MD  Rivaroxaban (XARELTO) 20 MG TABS tablet Take 1 tablet (20 mg total) by mouth daily. 06/01/13  Yes Thompson Grayer, MD  sennosides-docusate sodium (SENOKOT-S) 8.6-50 MG tablet Take 1 tablet by mouth daily.   Yes Historical Provider, MD  verapamil (CALAN-SR) 120  MG CR tablet Take 1 tablet (120 mg total) by mouth at bedtime. 01/11/14  Yes Thompson Grayer, MD  traMADol (ULTRAM) 50 MG tablet Take 50-100 mg by mouth every 6 (six) hours as needed for moderate pain or severe pain.  02/14/14   Historical Provider, MD   BP 112/83  Temp(Src) 97.6 F (36.4 C) (Oral)  Resp 20  SpO2 90% Physical Exam  Nursing note and vitals reviewed. Constitutional: She is oriented to person, place, and time. She appears well-developed and well-nourished.  HENT:  Head: Normocephalic and atraumatic.  Eyes: Conjunctivae are normal. Right eye exhibits no discharge. Left eye exhibits no discharge.  Neck: Normal range of motion. Neck supple. No tracheal deviation present.  Cardiovascular: Regular rhythm.  Tachycardia present.   Pulmonary/Chest: Effort normal. She has rales.  Abdominal: Soft. She exhibits no distension. There is no tenderness. There is no guarding.  Musculoskeletal: She exhibits edema (mild bilateral worse on postop right leg) and tenderness (mild right knee postop bandage in place without surrounding erythema or induration).  Neurological: She is alert and oriented to person, place, and time. No cranial nerve deficit.  Skin: Skin is warm. No rash noted.  Psychiatric: She has a normal mood and affect.    ED Course  Procedures (including critical care time) Labs Review Labs Reviewed  CBC WITH DIFFERENTIAL - Abnormal; Notable for the following:    Neutrophils Relative % 84 (*)    Lymphocytes Relative 8 (*)    All other components within normal limits  BASIC METABOLIC PANEL - Abnormal; Notable for the following:    Sodium 134 (*)    Glucose, Bld 124 (*)    BUN 26 (*)    GFR calc non Af Amer 80 (*)    All other components within normal limits  URINALYSIS, ROUTINE W REFLEX MICROSCOPIC - Abnormal; Notable for the following:    Color, Urine AMBER (*)    APPearance CLOUDY (*)    Specific Gravity, Urine 1.034 (*)    Hgb urine dipstick MODERATE (*)    Ketones, ur  40 (*)    Protein, ur 100 (*)    Nitrite POSITIVE (*)    All other components within normal limits  URINE MICROSCOPIC-ADD ON - Abnormal; Notable for the following:    Squamous Epithelial / LPF FEW (*)    Bacteria, UA MANY (*)    Casts GRANULAR CAST (*)    All other components within normal limits  URINE CULTURE  MAGNESIUM  I-STAT CG4 LACTIC ACID, ED    Imaging Review Dg Chest 2 View  03/13/2014   CLINICAL DATA:  Short of breath.  Cough with atrial fibrillation.  EXAM: CHEST  2 VIEW  COMPARISON:  11/24/2013  FINDINGS: Lateral view degraded by patient arm position. Mild motion degradation involving the lateral view as well. Midline trachea. Moderate cardiomegaly with a tortuous thoracic aorta. No congestive failure. No pleural effusion or pneumothorax. Mild right hemidiaphragm  elevation. Mild volume loss and atelectasis in the right suprahilar region.  IMPRESSION: Cardiomegaly, without congestive failure or acute disease.   Electronically Signed   By: Abigail Miyamoto M.D.   On: 03/13/2014 22:01   Ct Angio Chest Pe W/cm &/or Wo Cm  03/13/2014   CLINICAL DATA:  Tachycardia. Postoperative patient. Evaluate for pulmonary embolism.  EXAM: CT ANGIOGRAPHY CHEST WITH CONTRAST  TECHNIQUE: Multidetector CT imaging of the chest was performed using the standard protocol during bolus administration of intravenous contrast. Multiplanar CT image reconstructions and MIPs were obtained to evaluate the vascular anatomy.  CONTRAST:  151mL OMNIPAQUE IOHEXOL 350 MG/ML SOLN  COMPARISON:  Chest CT 04/25/2013.  FINDINGS: Mediastinum: Study is significantly limited for assessment of pulmonary embolism by a large amount of patient respiratory motion. With these limitations in mind, there is no evidence of clinically significant central, lobar or segmental sized embolus. Smaller subsegmental sized emboli cannot be entirely excluded. The heart is enlarged, with marked dilatation of the right atrium and right ventricle, which  appears to be chronic and is very similar to remote prior study 04/25/2013. There is atherosclerosis of the thoracic aorta, the great vessels of the mediastinum and the coronary arteries, including calcified atherosclerotic plaque in the left main, left anterior descending, left circumflex and right coronary arteries. In addition, there is ectasia of the ascending thoracic aorta which measures up to 4.2 cm in diameter. There is no significant pericardial fluid, thickening or pericardial calcification. No pathologically enlarged mediastinal or hilar lymph nodes. Esophagus is unremarkable in appearance.  Lungs/Pleura: Trace right pleural effusion layering dependently. Areas of mild dependent subsegmental atelectasis throughout the lower lobes of the lungs bilaterally. No consolidative airspace disease. No definite suspicious appearing pulmonary nodules or masses.  Upper Abdomen: Atherosclerosis.  Musculoskeletal: Old healed fracture of the mid sternum incidentally noted. In addition, there is worsening compression of T12 related to an inferior endplate fracture, which does not appear acute, with approximately 30% loss of anterior vertebral body height. There are no aggressive appearing lytic or blastic lesions noted in the visualized portions of the skeleton.  Review of the MIP images confirms the above findings.  IMPRESSION: 1. Despite the limitations of today's examination, there is no evidence to suggest clinically relevant central, lobar or segmental sized pulmonary embolism. 2. Trace right pleural effusion. Areas of mild dependent subsegmental atelectasis throughout the lower lobes of the lungs bilaterally. 3. Cardiomegaly with marked dilatation of the right atrium and right ventricle which appears to be chronic in this patient. 4. Atherosclerosis, including left main and 3 vessel coronary artery disease. Assessment for potential risk factor modification, dietary therapy or pharmacologic therapy may be warranted,  if clinically indicated. 5. Additional findings, as above.   Electronically Signed   By: Vinnie Langton M.D.   On: 03/13/2014 23:08     EKG Interpretation None     ekg below MDM   Final diagnoses:  Tachycardia  UTI (lower urinary tract infection)  Confusion   Patient presents with tachycardia postop. Discussed differential with patient including A. fib with RVR/patient has history of A. fib, pneumonia/UTI, dehydration, pulmonary embolus as patient is not currently on blood thinners. Plan for blood work, urine, chest x-ray, EKG and reassessment.  EKG reviewed showing junctional/sinus tachycardia with prolonged QT interval, heart rate 140, normal axis, nonspecific ST changes.  With mild general confusion/hallucinations and concerning urine alert culture and antibiotics IV. CT scan done which did not show acute pulmonary embolus.  Diltiazem bolus given and heart  rate improved to 70s. Repeat EKG ordered.  Plan for telemetry observation. Page triad Patient another episode of heart rate in the 140s diltiazem drip ordered.  Nurse reported shortly after the artery was back to normal. Plan for low dose diltiazem drip and telemetry admission. Spoke with hospitalist. The patients results and plan were reviewed and discussed.   Any x-rays performed were personally reviewed by myself.   Differential diagnosis were considered with the presenting HPI.   Filed Vitals:   03/13/14 2030 03/13/14 2115 03/13/14 2200 03/13/14 2306  BP: 106/75 112/83 99/74 105/79  Pulse: 143 138 140   Temp:      TempSrc:      Resp: 18 32 30 29  SpO2: 95% 97% 96%     Admission/ observation were discussed with the admitting physician, patient and/or family and they are comfortable with the plan.    Mariea Clonts, MD 03/14/14 Laureen Abrahams

## 2014-03-13 NOTE — ED Notes (Signed)
Pt taken to xray 

## 2014-03-14 ENCOUNTER — Encounter (HOSPITAL_COMMUNITY): Payer: Self-pay | Admitting: Internal Medicine

## 2014-03-14 ENCOUNTER — Inpatient Hospital Stay (HOSPITAL_COMMUNITY): Payer: Medicare Other

## 2014-03-14 ENCOUNTER — Encounter: Payer: Self-pay | Admitting: *Deleted

## 2014-03-14 DIAGNOSIS — I10 Essential (primary) hypertension: Secondary | ICD-10-CM

## 2014-03-14 DIAGNOSIS — I2789 Other specified pulmonary heart diseases: Secondary | ICD-10-CM

## 2014-03-14 DIAGNOSIS — I2699 Other pulmonary embolism without acute cor pulmonale: Secondary | ICD-10-CM

## 2014-03-14 DIAGNOSIS — M1990 Unspecified osteoarthritis, unspecified site: Secondary | ICD-10-CM

## 2014-03-14 DIAGNOSIS — R000 Tachycardia, unspecified: Secondary | ICD-10-CM

## 2014-03-14 DIAGNOSIS — N390 Urinary tract infection, site not specified: Principal | ICD-10-CM | POA: Diagnosis present

## 2014-03-14 DIAGNOSIS — T8489XA Other specified complication of internal orthopedic prosthetic devices, implants and grafts, initial encounter: Secondary | ICD-10-CM

## 2014-03-14 DIAGNOSIS — Z96659 Presence of unspecified artificial knee joint: Secondary | ICD-10-CM

## 2014-03-14 DIAGNOSIS — R0789 Other chest pain: Secondary | ICD-10-CM

## 2014-03-14 DIAGNOSIS — I471 Supraventricular tachycardia: Secondary | ICD-10-CM

## 2014-03-14 DIAGNOSIS — G9340 Encephalopathy, unspecified: Secondary | ICD-10-CM | POA: Diagnosis present

## 2014-03-14 DIAGNOSIS — E7800 Pure hypercholesterolemia, unspecified: Secondary | ICD-10-CM

## 2014-03-14 LAB — LIPID PANEL
Cholesterol: 104 mg/dL (ref 0–200)
HDL: 32 mg/dL — ABNORMAL LOW
LDL Cholesterol: 53 mg/dL (ref 0–99)
Total CHOL/HDL Ratio: 3.3 ratio
Triglycerides: 95 mg/dL
VLDL: 19 mg/dL (ref 0–40)

## 2014-03-14 LAB — BASIC METABOLIC PANEL WITH GFR
BUN: 25 mg/dL — ABNORMAL HIGH (ref 6–23)
CO2: 28 meq/L (ref 19–32)
Calcium: 8.5 mg/dL (ref 8.4–10.5)
Chloride: 96 meq/L (ref 96–112)
Creatinine, Ser: 0.7 mg/dL (ref 0.50–1.10)
GFR calc Af Amer: >90 mL/min
GFR calc non Af Amer: 81 mL/min — ABNORMAL LOW
Glucose, Bld: 107 mg/dL — ABNORMAL HIGH (ref 70–99)
Potassium: 3.5 meq/L — ABNORMAL LOW (ref 3.7–5.3)
Sodium: 137 meq/L (ref 137–147)

## 2014-03-14 LAB — CBC
HCT: 36.9 % (ref 36.0–46.0)
Hemoglobin: 12.2 g/dL (ref 12.0–15.0)
MCH: 31.2 pg (ref 26.0–34.0)
MCHC: 33.1 g/dL (ref 30.0–36.0)
MCV: 94.4 fL (ref 78.0–100.0)
Platelets: 273 10*3/uL (ref 150–400)
RBC: 3.91 MIL/uL (ref 3.87–5.11)
RDW: 13.6 % (ref 11.5–15.5)
WBC: 8 10*3/uL (ref 4.0–10.5)

## 2014-03-14 LAB — COMPREHENSIVE METABOLIC PANEL
ALT: 17 U/L (ref 0–35)
AST: 25 U/L (ref 0–37)
Albumin: 2.6 g/dL — ABNORMAL LOW (ref 3.5–5.2)
Alkaline Phosphatase: 66 U/L (ref 39–117)
BUN: 25 mg/dL — ABNORMAL HIGH (ref 6–23)
CALCIUM: 8.2 mg/dL — AB (ref 8.4–10.5)
CO2: 26 mEq/L (ref 19–32)
CREATININE: 0.69 mg/dL (ref 0.50–1.10)
Chloride: 95 mEq/L — ABNORMAL LOW (ref 96–112)
GFR calc Af Amer: >90 mL/min (ref >90)
GFR, EST NON AFRICAN AMERICAN: 81 mL/min — AB (ref >90)
GLUCOSE: 123 mg/dL — AB (ref 70–99)
Potassium: 4 mEq/L (ref 3.7–5.3)
Sodium: 134 mEq/L — ABNORMAL LOW (ref 137–147)
Total Bilirubin: 0.7 mg/dL (ref 0.3–1.2)
Total Protein: 6.5 g/dL (ref 6.0–8.3)

## 2014-03-14 LAB — HIV ANTIBODY (ROUTINE TESTING W REFLEX): HIV: NONREACTIVE

## 2014-03-14 LAB — VITAMIN B12: Vitamin B-12: 578 pg/mL (ref 211–911)

## 2014-03-14 LAB — MRSA PCR SCREENING: MRSA by PCR: NEGATIVE

## 2014-03-14 LAB — RPR

## 2014-03-14 LAB — TSH: TSH: 3.6 u[IU]/mL (ref 0.350–4.500)

## 2014-03-14 LAB — MAGNESIUM: Magnesium: 2 mg/dL (ref 1.5–2.5)

## 2014-03-14 MED ORDER — SODIUM CHLORIDE 0.9 % IV SOLN
INTRAVENOUS | Status: AC
  Administered 2014-03-14: 01:00:00 via INTRAVENOUS

## 2014-03-14 MED ORDER — RIVAROXABAN 20 MG PO TABS
20.0000 mg | ORAL_TABLET | Freq: Every day | ORAL | Status: DC
  Administered 2014-03-14 – 2014-03-15 (×2): 20 mg via ORAL
  Filled 2014-03-14 (×3): qty 1

## 2014-03-14 MED ORDER — DEXTROSE 5 % IV SOLN
1.0000 g | Freq: Two times a day (BID) | INTRAVENOUS | Status: DC
  Administered 2014-03-14 – 2014-03-15 (×4): 1 g via INTRAVENOUS
  Filled 2014-03-14 (×6): qty 1

## 2014-03-14 MED ORDER — SODIUM CHLORIDE 0.9 % IJ SOLN: 3.0000 mL | INTRAMUSCULAR | Status: DC | PRN

## 2014-03-14 MED ORDER — SODIUM CHLORIDE 0.9 % IJ SOLN
3.0000 mL | Freq: Two times a day (BID) | INTRAMUSCULAR | Status: DC
  Administered 2014-03-14 – 2014-03-16 (×2): 3 mL via INTRAVENOUS

## 2014-03-14 MED ORDER — POLYETHYLENE GLYCOL 3350 17 G PO PACK
17.0000 g | PACK | Freq: Every day | ORAL | Status: DC
  Administered 2014-03-16: 17 g via ORAL
  Filled 2014-03-14 (×3): qty 1

## 2014-03-14 MED ORDER — ONDANSETRON HCL 4 MG/2ML IJ SOLN: 4.0000 mg | Freq: Four times a day (QID) | INTRAMUSCULAR | Status: DC | PRN

## 2014-03-14 MED ORDER — ONDANSETRON HCL 4 MG PO TABS: 4.0000 mg | ORAL_TABLET | Freq: Four times a day (QID) | ORAL | Status: DC | PRN

## 2014-03-14 MED ORDER — ACETAMINOPHEN 325 MG PO TABS: 650.0000 mg | ORAL_TABLET | Freq: Four times a day (QID) | ORAL | Status: DC | PRN

## 2014-03-14 MED ORDER — DILTIAZEM HCL 25 MG/5ML IV SOLN: 10.0000 mg | Freq: Once | INTRAVENOUS | Status: DC

## 2014-03-14 MED ORDER — OXYCODONE HCL 5 MG PO TABS
5.0000 mg | ORAL_TABLET | Freq: Four times a day (QID) | ORAL | Status: DC | PRN
  Administered 2014-03-14 – 2014-03-15 (×5): 5 mg via ORAL
  Filled 2014-03-14 (×5): qty 1

## 2014-03-14 MED ORDER — DILTIAZEM HCL 100 MG IV SOLR
5.0000 mg/h | INTRAVENOUS | Status: DC
  Administered 2014-03-14: 10 mg/h via INTRAVENOUS
  Administered 2014-03-14: 5 mg/h via INTRAVENOUS
  Filled 2014-03-14 (×2): qty 100

## 2014-03-14 MED ORDER — SODIUM CHLORIDE 0.9 % IV SOLN: 250.0000 mL | INTRAVENOUS | Status: DC | PRN

## 2014-03-14 MED ORDER — METOPROLOL TARTRATE 25 MG PO TABS
25.0000 mg | ORAL_TABLET | Freq: Two times a day (BID) | ORAL | Status: DC
  Administered 2014-03-14 (×2): 25 mg via ORAL
  Filled 2014-03-14 (×4): qty 1

## 2014-03-14 MED ORDER — ACETAMINOPHEN 650 MG RE SUPP: 650.0000 mg | Freq: Four times a day (QID) | RECTAL | Status: DC | PRN

## 2014-03-14 MED ORDER — CALCIUM CARBONATE-VITAMIN D 500-200 MG-UNIT PO TABS
1.0000 | ORAL_TABLET | Freq: Two times a day (BID) | ORAL | Status: DC
  Administered 2014-03-14 – 2014-03-16 (×5): 1 via ORAL
  Filled 2014-03-14 (×7): qty 1

## 2014-03-14 NOTE — Evaluation (Signed)
Physical Therapy Evaluation Patient Details Name: [name] MRN: [id number] DOB: 07-Aug-1934 Today's Date: 03/14/2014   History of Present Illness  -Encephalopathy acute: most likely toxic in nature secondary to UTI. Was rehab-ing post recent TKA on Thurs, 5/14  Clinical Impression  Pt admitted with above. Pt currently with functional limitations due to the deficits listed below (see PT Problem List).  Pt will benefit from skilled PT to increase their independence and safety with mobility to allow discharge to the venue listed below.       Follow Up Recommendations SNF    Equipment Recommendations  Rolling walker with 5" wheels;3in1 (PT)    Recommendations for Other Services       Precautions / Restrictions Precautions Precautions: Fall;Knee Precaution Comments: Reinforced no pillow under knee Required Braces or Orthoses: Knee Immobilizer - Right (Pt and family describe using what sounds like a KI at rehab; I offered to have Ortho techs bring one, but pt really wants to use the one she has now; family to bring it in) Restrictions RLE Weight Bearing: Weight bearing as tolerated      Mobility  Bed Mobility                  Transfers Overall transfer level: Needs assistance Equipment used: Rolling walker (2 wheeled) Transfers: Sit to/from Stand Sit to Stand: Mod assist         General transfer comment: Heavy mod assist for power-up from recliner to RW  Ambulation/Gait Ambulation/Gait assistance: Mod assist Ambulation Distance (Feet): 0 Feet (March in place in fromt of chair) Assistive device: Rolling walker (2 wheeled)       General Gait Details: March in place infront of chair today with close guard of Right knee; Able to single limb stance without overt buckling R knee, which seemed encouraging to pt  Stairs            Wheelchair Mobility    Modified Rankin (Stroke Patients Only)       Balance                                              Pertinent Vitals/Pain 8/10 Right knee with therex patient repositioned for comfort and Optimal knee extension     Home Living Family/patient expects to be discharged to:: Skilled nursing facility Living Arrangements: Alone (prior to SNF)                    Prior Function Level of Independence: Independent               Hand Dominance        Extremity/Trunk Assessment   Upper Extremity Assessment: Overall WFL for tasks assessed           Lower Extremity Assessment: RLE deficits/detail RLE Deficits / Details: decr AROM and strength, limited by pain; Noted quad activation       Communication   Communication: No difficulties  Cognition Arousal/Alertness: Awake/alert Behavior During Therapy: WFL for tasks assessed/performed Overall Cognitive Status: Within Functional Limits for tasks assessed                      General Comments      Exercises Total Joint Exercises Quad Sets: AROM;Right;10 reps Short Arc QuadSinclair Ship;Right;10 reps Straight Leg Raises: AAROM;Right;10 reps Long Arc Quad: AAROM;Right;10 reps Knee Flexion:  AAROM;Right;10 reps;Seated Goniometric ROM: 0-60 deg      Assessment/Plan    PT Assessment Patient needs continued PT services  PT Diagnosis Difficulty walking;Acute pain   PT Problem List Decreased strength;Decreased range of motion;Decreased activity tolerance;Decreased balance;Decreased mobility;Decreased knowledge of use of DME;Decreased knowledge of precautions;Pain  PT Treatment Interventions DME instruction;Gait training;Functional mobility training;Therapeutic activities;Therapeutic exercise;Balance training;Patient/family education   PT Goals (Current goals can be found in the Care Plan section) Acute Rehab PT Goals Patient Stated Goal: [place] beach [place] PT Goal Formulation: With patient Time For Goal Achievement: 03/28/14 Potential to Achieve Goals: Good    Frequency Min 6X/week    Barriers to discharge        Co-evaluation               End of Session Equipment Utilized During Treatment: Gait belt Activity Tolerance: Patient tolerated treatment well Patient left: in chair;with call bell/phone within reach Nurse Communication: Mobility status         Time: 2563-8937 PT Time Calculation (min): 44 min   Charges:   PT Evaluation $Initial PT Evaluation Tier I: 1 Procedure PT Treatments $Gait Training: 8-22 mins $Therapeutic Exercise: 8-22 mins $Therapeutic Activity: 8-22 mins   PT G Codes:          [facility] 03/14/2014, 1:03 PM Roney Marion, [place] Pager [phone] Office [phone]

## 2014-03-14 NOTE — ED Notes (Signed)
Pt's family concerned about penicillin allergy and rocephin antibiotics given.  EDP to talked to pt about concerns.  Pt reporting being flushed.  No hives noted on pt.  Per allergy list, penicillin causes pt to break out in hives.

## 2014-03-14 NOTE — Progress Notes (Signed)
ANTIBIOTIC CONSULT NOTE - INITIAL  Pharmacy Consult for Cefepime Indication: UTI  Allergies  Allergen Reactions  . Penicillins Hives  . Prednisone Other (See Comments)    Causes insomnia  . Sulfonamide Derivatives Hives  . Adhesive [Tape] Other (See Comments)    redness    Patient Measurements:    Vital Signs: Temp: 97.6 F (36.4 C) (05/18 1924) Temp src: Oral (05/18 1924) BP: 101/61 mmHg (05/19 0200) Pulse Rate: 136 (05/19 0200) Intake/Output from previous day:   Intake/Output from this shift:    Labs:  Recent Labs  03/13/14 1956  WBC 7.8  HGB 12.3  PLT 256  CREATININE 0.72   The CrCl is unknown because both a height and weight (above a minimum accepted value) are required for this calculation. No results found for this basename: VANCOTROUGH, VANCOPEAK, VANCORANDOM, GENTTROUGH, GENTPEAK, GENTRANDOM, TOBRATROUGH, TOBRAPEAK, TOBRARND, AMIKACINPEAK, AMIKACINTROU, AMIKACIN,  in the last 72 hours   Microbiology: No results found for this or any previous visit (from the past 720 hour(s)).  Medical History: Past Medical History  Diagnosis Date  . Osteopenia   . Hyperlipidemia   . Hypertension   . PSVT (paroxysmal supraventricular tachycardia)   . Spinal stenosis   . Overactive bladder     urge incontinence  . Pulmonary emboli   . Pulmonary hypertension   . Right ventricular failure     Medications:  See electronic med rec  Assessment: 80 y.o. female presents with tachycardia. Pt received Rocephin x 1 in ED ~2340. To begin Cefepime for UTI. Urine cx pending. Estimated CrCl ~55 ml/min  Goal of Therapy:  Resolution of infection  Plan:  1. Cefepime 1gm IV q12h 2. Will f/u micro data, pt's clinical condition, and renal function  Sherlon Handing, PharmD, BCPS Clinical pharmacist, pager [phone] 03/14/2014,2:21 AM

## 2014-03-14 NOTE — ED Notes (Signed)
Per EDP, Cardizem drip to not be titrated.  Cardizem dripped to be maintained at 5 mg.

## 2014-03-14 NOTE — H&P (Signed)
[facility] History and Physical  [name] DGU:[id number] DOB: 11-30-1933 DOA: 03/13/2014  Referring physician: Dr. Reather Converse PCP: Loura Pardon, MD   Chief Complaint: changes in mental status and tachycardia  HPI: [name] is a 80 y.o. female medical history significant for hypertension, hyperlipidemia, paroxysmal supraventricular tachycardia, history of pulmonary embolism (on xarelto), pulmonary hypertension and right ventricular failure; came to the ED from a skilled nursing facility (In place where she reside) due to tachycardia/presumed A. Fib and also mild changes in her mentation. Patient reports that she had not been eating and drinking properly for the last 2 days or so due to decreased appetite and just feeling fatigue/weak. Patient denies any chest pain, shortness of breath, fever, chills, headache, nausea, vomiting or any other acute complaints. Of note, patient had a recent right total knee replacement at [facility], no complications otherwise rehabilitating properly according to records. In the ED patient was found to be tachycardic with a heart rate ranging from 90's to 140; urinalysis suggesting UTI and mild dehydration (increased and specific gravity). [facility] has been called to admit the patient for further evaluation and treatment.   Review of Systems:  Fracture as otherwise mentioned on history of present illness.  Past Medical History  Diagnosis Date  . Osteopenia   . Hyperlipidemia   . Hypertension   . PSVT (paroxysmal supraventricular tachycardia)   . Spinal stenosis   . Overactive bladder     urge incontinence  . Pulmonary emboli   . Pulmonary hypertension   . Right ventricular failure    Past Surgical History  Procedure Laterality Date  . Cataract extraction  2008  . Bladder suspension  1993  . Joint replacement  2000    left TKR  . Total knee revision  01/12/2012    Procedure: TOTAL KNEE REVISION;  Surgeon: Kerin Salen, MD;   Location: [place];  Service: Orthopedics;  Laterality: Left;  DEPUY/ LCS , HAND SET  . Trigger finger release  01/12/2012    Procedure: RELEASE TRIGGER FINGER/A-1 PULLEY;  Surgeon: Kerin Salen, MD;  Location: [place];  Service: Orthopedics;  Laterality: Right;  . Cholecystectomy    . Hernia repair    . Total knee arthroplasty  10/29/2012    at [facility]   Social History:  reports that she has never smoked. She does not have any smokeless tobacco history on file. She reports that she does not drink alcohol or use illicit drugs.  Allergies  Allergen Reactions  . Penicillins Hives  . Prednisone Other (See Comments)    Causes insomnia  . Sulfonamide Derivatives Hives  . Adhesive [Tape] Other (See Comments)    redness    Family History  Problem Relation Age of Onset  . Cancer Mother     breast CA  . Heart disease Father     MI  . Diabetes Father   . Hypertension Father   . Cancer Sister     uterine CA     Prior to Admission medications   Medication Sig Start Date End Date Taking? Authorizing Provider  CALCIUM-VITAMIN D PO Take 1 tablet by mouth 3 (three) times daily.   Yes Historical Provider, MD  hydrochlorothiazide (HYDRODIURIL) 25 MG tablet Take 25 mg by mouth daily.    Yes Historical Provider, MD  metoprolol (LOPRESSOR) 50 MG tablet Take 0.5 tablets (25 mg total) by mouth daily. 01/19/14  Yes Thompson Grayer, MD  Multiple Vitamin (MULTIVITAMIN WITH MINERALS) TABS tablet Take 1  tablet by mouth daily.   Yes Historical Provider, MD  polyethylene glycol (MIRALAX / GLYCOLAX) packet Take 17 g by mouth daily.    Yes Historical Provider, MD  Rivaroxaban (XARELTO) 20 MG TABS tablet Take 1 tablet (20 mg total) by mouth daily. 06/01/13  Yes Thompson Grayer, MD  sennosides-docusate sodium (SENOKOT-S) 8.6-50 MG tablet Take 1 tablet by mouth daily.   Yes Historical Provider, MD  verapamil (CALAN-SR) 120 MG CR tablet Take 1 tablet (120 mg total) by mouth at bedtime. 01/11/14  Yes Thompson Grayer, MD  traMADol  (ULTRAM) 50 MG tablet Take 50-100 mg by mouth every 6 (six) hours as needed for moderate pain or severe pain.  02/14/14   Historical Provider, MD   Physical Exam: Filed Vitals:   03/14/14 0200  BP: 101/61  Pulse: 136  Temp:   Resp: 26    BP 101/61  Pulse 136  Temp(Src) 97.6 F (36.4 C) (Oral)  Resp 26  SpO2 96%  General:  Appears calm and in NAD; currently afebrile. Patient able to follow commands properly Eyes: PERRL, normal lids, irises & conjunctiva, EOMI, no icterus ENT: grossly normal hearing, lips & tongue, no erythema or exudates inside her mouth, mild dryness of MM Neck: no LAD, masses or thyromegaly, no JVD Cardiovascular: tachycardic, soft SEM, no rubs or gallops. 2+ edema bilaterally; TED hoses in place. Telemetry: Sinus tachycardia (HR fluctuates between 80's to 140's) Respiratory: CTA bilaterally, no w/r/r. Normal respiratory effort. Abdomen: soft, nt, nd, positive BS Musculoskeletal: decrease range of motion due to pain in RLE, a Psychiatric: grossly normal mood and affect, speech appropriate Neurologic: grossly non-focal. Mild confusion with deep thoughts and insight; easily redirected and able to answer question appropriately.           Labs on Admission:  Basic Metabolic Panel:  Recent Labs Lab 03/13/14 1956  NA 134*  K 3.8  CL 96  CO2 26  GLUCOSE 124*  BUN 26*  CREATININE 0.72  CALCIUM 8.4   CBC:  Recent Labs Lab 03/13/14 1956  WBC 7.8  NEUTROABS 6.5  HGB 12.3  HCT 36.6  MCV 93.6  PLT 256   BNP (last 3 results)  Recent Labs  04/25/13 1745 08/01/13 1541  PROBNP 571.4* 139.0*   Radiological Exams on Admission: Dg Chest 2 View  03/13/2014   CLINICAL DATA:  Short of breath.  Cough with atrial fibrillation.  EXAM: CHEST  2 VIEW  COMPARISON:  11/24/2013  FINDINGS: Lateral view degraded by patient arm position. Mild motion degradation involving the lateral view as well. Midline trachea. Moderate cardiomegaly with a tortuous thoracic  aorta. No congestive failure. No pleural effusion or pneumothorax. Mild right hemidiaphragm elevation. Mild volume loss and atelectasis in the right suprahilar region.  IMPRESSION: Cardiomegaly, without congestive failure or acute disease.   Electronically Signed   By: Abigail Miyamoto M.D.   On: 03/13/2014 22:01   Ct Angio Chest Pe W/cm &/or Wo Cm  03/13/2014   CLINICAL DATA:  Tachycardia. Postoperative patient. Evaluate for pulmonary embolism.  EXAM: CT ANGIOGRAPHY CHEST WITH CONTRAST  TECHNIQUE: Multidetector CT imaging of the chest was performed using the standard protocol during bolus administration of intravenous contrast. Multiplanar CT image reconstructions and MIPs were obtained to evaluate the vascular anatomy.  CONTRAST:  170mL OMNIPAQUE IOHEXOL 350 MG/ML SOLN  COMPARISON:  Chest CT 04/25/2013.  FINDINGS: Mediastinum: Study is significantly limited for assessment of pulmonary embolism by a large amount of patient respiratory motion. With these limitations in  mind, there is no evidence of clinically significant central, lobar or segmental sized embolus. Smaller subsegmental sized emboli cannot be entirely excluded. The heart is enlarged, with marked dilatation of the right atrium and right ventricle, which appears to be chronic and is very similar to remote prior study 04/25/2013. There is atherosclerosis of the thoracic aorta, the great vessels of the mediastinum and the coronary arteries, including calcified atherosclerotic plaque in the left main, left anterior descending, left circumflex and right coronary arteries. In addition, there is ectasia of the ascending thoracic aorta which measures up to 4.2 cm in diameter. There is no significant pericardial fluid, thickening or pericardial calcification. No pathologically enlarged mediastinal or hilar lymph nodes. Esophagus is unremarkable in appearance.  Lungs/Pleura: Trace right pleural effusion layering dependently. Areas of mild dependent subsegmental  atelectasis throughout the lower lobes of the lungs bilaterally. No consolidative airspace disease. No definite suspicious appearing pulmonary nodules or masses.  Upper Abdomen: Atherosclerosis.  Musculoskeletal: Old healed fracture of the mid sternum incidentally noted. In addition, there is worsening compression of T12 related to an inferior endplate fracture, which does not appear acute, with approximately 30% loss of anterior vertebral body height. There are no aggressive appearing lytic or blastic lesions noted in the visualized portions of the skeleton.  Review of the MIP images confirms the above findings.  IMPRESSION: 1. Despite the limitations of today's examination, there is no evidence to suggest clinically relevant central, lobar or segmental sized pulmonary embolism. 2. Trace right pleural effusion. Areas of mild dependent subsegmental atelectasis throughout the lower lobes of the lungs bilaterally. 3. Cardiomegaly with marked dilatation of the right atrium and right ventricle which appears to be chronic in this patient. 4. Atherosclerosis, including left main and 3 vessel coronary artery disease. Assessment for potential risk factor modification, dietary therapy or pharmacologic therapy may be warranted, if clinically indicated. 5. Additional findings, as above.   Electronically Signed   By: Vinnie Langton M.D.   On: 03/13/2014 23:08    EKG: EKG reviewed showing junctional/sinus tachycardia with prolonged QT interval, heart rate 140, normal axis, nonspecific ST changes.  Assessment/Plan 1-Encephalopathy acute: most likely toxic in nature secondary to UTI. -Will treat infection -Provide supportive care -will check CT head w/o contrast - will check a TSH, B12, and RPR.   2-HYPERCHOLESTEROLEMIA, PURE: Will check lipid profile. -patient was not on statins at home  3-HYPERTENSION, ESSENTIAL NOS: stable. Will continue lopressor. -patient also on cardizem for her HR -will follow and adjust  meds as needed  4-PSVT: Patient has history of paroxysmal SVT.  -Will continue Cardizem drip at 5 -will continue metoprolol BID -EP consult in am  5-PE (pulmonary embolism): neg CT angio -continue xarelto  6-UTI (lower urinary tract infection): will follow urine culture -continue empirically cefepime given patient allergies  DVT: on full anticoagulation (which would satisfy DVT prophylaxis    Needs electrophysiology consult in am (Dr. Follow with Dr. Rayann Heman)  Code Status: DNR Family Communication: husband and daughter at bedside  Disposition Plan: LOS > 2 midnights, telemetry, inpatient  Time spent: 50 minutes  [facility] Pager [phone]

## 2014-03-14 NOTE — ED Notes (Signed)
Pt transported to Ct.

## 2014-03-14 NOTE — ED Notes (Signed)
Pt's heart rate noted to be in the 140's.  EDP made aware.  Cardizem drip to be ordered.

## 2014-03-14 NOTE — ED Notes (Signed)
Pt back from transport.

## 2014-03-14 NOTE — Progress Notes (Signed)
Utilization review completed.  

## 2014-03-14 NOTE — Consult Note (Signed)
ELECTROPHYSIOLOGY CONSULT NOTE    Patient ID: [name] MRN: [id number], DOB/AGE: [birth year] 80 y.o.  Admit date: 03/13/2014 Date of Consult: 03-14-2014  Primary Physician: Loura Pardon, MD Electrophysiologist: Madason Rauls  Reason for Consultation: SVT  HPI:  [name] is a 80 y.o. female with a past medical history significant for hypertension, pulmonary emboli (on chronic anticoagulation with Xarelto), pulmonary hypertension, and SVT. She has a long standing history of SVT that has been medically managed.  She has previously declined ablation.   She is currently residing in a SNF and was noted to have mental status changes with tachycardia.  Per report, she has had decreased PO intake with fatigue and weakness for the last few days.  On arrival to ER, she was found to be in SVT at a rate of 140.  She has been placed on Cardizem drip.  Lab work is notable for likely UTI, BUN 26, creat 0.72.   Echo that was ordered 01-2014 has not yet been done.  Last echo 04-2013 demonstrated EF 55-60%, mild LVH, no RWMA, RV mildly dilated, peak PA pressure 46, moderate TR.  EP has been asked to consult for treatment options.   ROS is negative except as outlined above.   Past Medical History  Diagnosis Date  . Osteopenia   . Hyperlipidemia   . Hypertension   . PSVT (paroxysmal supraventricular tachycardia)   . Spinal stenosis   . Overactive bladder     urge incontinence  . Pulmonary emboli   . Pulmonary hypertension   . Right ventricular failure      Surgical History:  Past Surgical History  Procedure Laterality Date  . Cataract extraction  2008  . Bladder suspension  1993  . Joint replacement  2000    left TKR  . Total knee revision  01/12/2012    Procedure: TOTAL KNEE REVISION;  Surgeon: Kerin Salen, MD;  Location: [place];  Service: Orthopedics;  Laterality: Left;  DEPUY/ LCS , HAND SET  . Trigger finger release  01/12/2012    Procedure: RELEASE TRIGGER FINGER/A-1 PULLEY;  Surgeon:  Kerin Salen, MD;  Location: [place];  Service: Orthopedics;  Laterality: Right;  . Cholecystectomy    . Hernia repair    . Total knee arthroplasty  10/29/2012    at [facility]     Prescriptions prior to admission  Medication Sig Dispense Refill  . CALCIUM-VITAMIN D PO Take 1 tablet by mouth 3 (three) times daily.      . hydrochlorothiazide (HYDRODIURIL) 25 MG tablet Take 25 mg by mouth daily.       . metoprolol (LOPRESSOR) 50 MG tablet Take 0.5 tablets (25 mg total) by mouth daily.  45 tablet  1  . Multiple Vitamin (MULTIVITAMIN WITH MINERALS) TABS tablet Take 1 tablet by mouth daily.      . polyethylene glycol (MIRALAX / GLYCOLAX) packet Take 17 g by mouth daily.       . Rivaroxaban (XARELTO) 20 MG TABS tablet Take 1 tablet (20 mg total) by mouth daily.  90 tablet  3  . sennosides-docusate sodium (SENOKOT-S) 8.6-50 MG tablet Take 1 tablet by mouth daily.      . verapamil (CALAN-SR) 120 MG CR tablet Take 1 tablet (120 mg total) by mouth at bedtime.  90 tablet  3  . traMADol (ULTRAM) 50 MG tablet Take 50-100 mg by mouth every 6 (six) hours as needed for moderate pain or severe pain.  Inpatient Medications:  . sodium chloride   Intravenous STAT  . calcium-vitamin D  1 tablet Oral BID WC  . ceFEPime (MAXIPIME) IV  1 g Intravenous Q12H  . metoprolol  25 mg Oral BID  . polyethylene glycol  17 g Oral Daily  . rivaroxaban  20 mg Oral QAC supper  . sodium chloride  3 mL Intravenous Q12H    Allergies:  Allergies  Allergen Reactions  . Penicillins Hives  . Prednisone Other (See Comments)    Causes insomnia  . Sulfonamide Derivatives Hives  . Adhesive [Tape] Other (See Comments)    redness    History   Social History  . Marital Status: Married    Spouse Name: N/A    Number of Children: N/A  . Years of Education: N/A   Occupational History  . Retired    Social History Main Topics  . Smoking status: Never Smoker   . Smokeless tobacco: Not on file  . Alcohol Use: No  . Drug  Use: No  . Sexual Activity: No   Other Topics Concern  . Not on file   Social History Narrative   Pt lives alone but dtr lives on property and has been staying with her since surgery.     Family History  Problem Relation Age of Onset  . Cancer Mother     breast CA  . Heart disease Father     MI  . Diabetes Father   . Hypertension Father   . Cancer Sister     uterine CA    Physical Exam: Filed Vitals:   03/14/14 0445 03/14/14 0552 03/14/14 0836 03/14/14 0956  BP: 124/75 114/65 116/64 119/47  Pulse: 119 129 132 130  Temp:  97.3 F (36.3 C)    TempSrc:  Oral    Resp: 27 21    Height:  5\' 6"  (1.676 m)    Weight:  211 lb 13.8 oz (96.1 kg)    SpO2: 94% 95%      GEN- The patient is elderly appearing, alert and oriented x 3 today.   Head- normocephalic, atraumatic Eyes-  Sclera clear, conjunctiva pink Ears- hearing intact Oropharynx- clear with dry MM Neck- supple,  Lungs- Clear to ausculation bilaterally, normal work of breathing Heart- Regular rate and rhythm, no murmurs, rubs or gallops, PMI not laterally displaced GI- soft, NT, ND, + BS Extremities- no clubbing, cyanosis, or edema MS- knee dressing in place Skin- no rash or lesion Psych- euthymic mood, full affect Neuro- strength and sensation are intact   Labs:   Lab Results  Component Value Date   WBC 8.0 03/14/2014   HGB 12.2 03/14/2014   HCT 36.9 03/14/2014   MCV 94.4 03/14/2014   PLT 273 03/14/2014    Recent Labs Lab 03/14/14 0315 03/14/14 0519  NA 134* 137  K 4.0 3.5*  CL 95* 96  CO2 26 28  BUN 25* 25*  CREATININE 0.69 0.70  CALCIUM 8.2* 8.5  PROT 6.5  --   BILITOT 0.7  --   ALKPHOS 66  --   ALT 17  --   AST 25  --   GLUCOSE 123* 107*     Radiology/Studies: Dg Chest 2 View 03/13/2014   CLINICAL DATA:  Short of breath.  Cough with atrial fibrillation.  EXAM: CHEST  2 VIEW  COMPARISON:  11/24/2013  FINDINGS: Lateral view degraded by patient arm position. Mild motion degradation involving the  lateral view as well. Midline trachea. Moderate cardiomegaly with  a tortuous thoracic aorta. No congestive failure. No pleural effusion or pneumothorax. Mild right hemidiaphragm elevation. Mild volume loss and atelectasis in the right suprahilar region.  IMPRESSION: Cardiomegaly, without congestive failure or acute disease.   Electronically Signed   By: Abigail Miyamoto M.D.   On: 03/13/2014 22:01   Ct Head Wo Contrast 03/14/2014   CLINICAL DATA:  General fatigue since knee surgery this past Thursday.  EXAM: CT HEAD WITHOUT CONTRAST  TECHNIQUE: Contiguous axial images were obtained from the base of the skull through the vertex without intravenous contrast.  COMPARISON:  CT HEAD W/O CM dated 04/25/2010  FINDINGS: Moderate ventriculomegaly, likely on the basis of global parenchymal brain volume loss as there is overall commensurate enlargement of cerebral sulci and cerebellar folia. No intraparenchymal hemorrhage, mass effect nor midline shift. Confluent supratentorial white matter hypodensities are within normal range for patient's age and though non-specific suggest sequelae of chronic small vessel ischemic disease. No acute large vascular territory infarcts.  No abnormal extra-axial fluid collections. 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 Basal cisterns are patent. Moderate calcific atherosclerosis of the carotid siphons.  No skull fracture. The included ocular globes and orbital contents are non-suspicious. Small amount of air within the cavernous sinuses suggests recent intravenous access. Status post bilateral ocular lens implants. The mastoid aircells and included paranasal sinuses are well-aerated. Severe temporomandibular osteoarthrosis.  IMPRESSION: No acute intracranial process.  Moderate global brain atrophy, increased from prior examination with moderate to severe white matter changes, progressed.   Electronically Signed   By: Elon Alas   On: 03/14/2014 03:07   Ct Angio  Chest Pe W/cm &/or Wo Cm 03/13/2014   CLINICAL DATA:  Tachycardia. Postoperative patient. Evaluate for pulmonary embolism.  EXAM: CT ANGIOGRAPHY CHEST WITH CONTRAST  TECHNIQUE: Multidetector CT imaging of the chest was performed using the standard protocol during bolus administration of intravenous contrast. Multiplanar CT image reconstructions and MIPs were obtained to evaluate the vascular anatomy.  CONTRAST:  167mL OMNIPAQUE IOHEXOL 350 MG/ML SOLN  COMPARISON:  Chest CT 04/25/2013.  FINDINGS: Mediastinum: Study is significantly limited for assessment of pulmonary embolism by a large amount of patient respiratory motion. With these limitations in mind, there is no evidence of clinically significant central, lobar or segmental sized embolus. Smaller subsegmental sized emboli cannot be entirely excluded. The heart is enlarged, with marked dilatation of the right atrium and right ventricle, which appears to be chronic and is very similar to remote prior study 04/25/2013. There is atherosclerosis of the thoracic aorta, the great vessels of the mediastinum and the coronary arteries, including calcified atherosclerotic plaque in the left main, left anterior descending, left circumflex and right coronary arteries. In addition, there is ectasia of the ascending thoracic aorta which measures up to 4.2 cm in diameter. There is no significant pericardial fluid, thickening or pericardial calcification. No pathologically enlarged mediastinal or hilar lymph nodes. Esophagus is unremarkable in appearance.  Lungs/Pleura: Trace right pleural effusion layering dependently. Areas of mild dependent subsegmental atelectasis throughout the lower lobes of the lungs bilaterally. No consolidative airspace disease. No definite suspicious appearing pulmonary nodules or masses.  Upper Abdomen: Atherosclerosis.  Musculoskeletal: Old healed fracture of the mid sternum incidentally noted. In addition, there is worsening compression of T12  related to an inferior endplate fracture, which does not appear acute, with approximately 30% loss of anterior vertebral body height. There are no aggressive appearing lytic or blastic lesions noted in the visualized portions of the skeleton.  Review  of the MIP images confirms the above findings.  IMPRESSION: 1. Despite the limitations of today's examination, there is no evidence to suggest clinically relevant central, lobar or segmental sized pulmonary embolism. 2. Trace right pleural effusion. Areas of mild dependent subsegmental atelectasis throughout the lower lobes of the lungs bilaterally. 3. Cardiomegaly with marked dilatation of the right atrium and right ventricle which appears to be chronic in this patient. 4. Atherosclerosis, including left main and 3 vessel coronary artery disease. Assessment for potential risk factor modification, dietary therapy or pharmacologic therapy may be warranted, if clinically indicated. 5. Additional findings, as above.   Electronically Signed   By: Vinnie Langton M.D.   On: 03/13/2014 23:08    EKG: short RP tachycardia at 140bpm  TELEMETRY:  Intermittent SVT  A/P  1. SVT The patient is well known to me with recurrent SVT.  At this time, she is on a diltiazem drip and appears to be doing a little better.  I suspect as her other medical issues improve that her SVT will also improve.  She has previously declined ablation.  I would not advise ablation in the setting of her acute medical issues.  We can revisit this as an outpatient.  2. Prior PTE/ pulmonary hypertension Would continue xarelto while here Repeat echo to re-evaluate pulmonary pressures

## 2014-03-14 NOTE — Progress Notes (Signed)
[facility] PROGRESS NOTE  [name] IZT:[id number] DOB: 06/08/1934 DOA: 03/13/2014 PCP: Loura Pardon, MD  Assessment/Plan: 80 y.o. 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 Acute encephalopathy acute: most likely toxic in nature secondary to UTI.  -neuro exam non focal; CT head: brain atrophy  -cont IV atx for UTI,  2. PSVT, h/o PAF; ? Sinus node dysfunction; h/o bradycardia (BB stopped, but then resumed agian)  -per cardiology notes, plan was possible ablation  -HR improved on IV cardizem; cont HR control with AVB agents;  consulted cardiology for ablation eval  3. HTN , cont home regimen, monitor   4. H/o VTE/PE: neg CT angio no new PE -continue xarelto  5. UTI (lower urinary tract infection):  -continue empirically cefepime given patient allergies, f/u cultures   6. Patient reports 2 episodes of diarrhea ? Black stool,  -denies acute bleeding; check c diff; occult blood; f/u CBC in AM     Code Status: DNR Family Communication: d/w patient, her husband, daughter  (indicate person spoken with, relationship, and if by phone, the number) Disposition Plan: likely snf    Consultants:  Cardiology   Procedures:  none  Antibiotics:  Cefepime 5/19<<<<<   (indicate start date, and stop date if known)  HPI/Subjective: alert  Objective: Filed Vitals:   03/14/14 0956  BP: 119/47  Pulse: 130  Temp:   Resp:    No intake or output data in the 24 hours ending 03/14/14 1127 Filed Weights   03/14/14 0552  Weight: 96.1 kg (211 lb 13.8 oz)    Exam:   General:  alert  Cardiovascular: s1,s2 rrr  Respiratory: CTA BL  Abdomen: soft, nt,nd   Musculoskeletal: mild edema   Data Reviewed: Basic Metabolic Panel:  Recent Labs Lab 03/13/14 1956 03/14/14 0315 03/14/14 0519  NA 134* 134* 137  K 3.8 4.0 3.5*  CL 96 95* 96   CO2 26 26 28   GLUCOSE 124* 123* 107*  BUN 26* 25* 25*  CREATININE 0.72 0.69 0.70  CALCIUM 8.4 8.2* 8.5  MG  --  2.0  --    Liver Function Tests:  Recent Labs Lab 03/14/14 0315  AST 25  ALT 17  ALKPHOS 66  BILITOT 0.7  PROT 6.5  ALBUMIN 2.6*   No results found for this basename: LIPASE, AMYLASE,  in the last 168 hours No results found for this basename: AMMONIA,  in the last 168 hours CBC:  Recent Labs Lab 03/13/14 1956 03/14/14 0519  WBC 7.8 8.0  NEUTROABS 6.5  --   HGB 12.3 12.2  HCT 36.6 36.9  MCV 93.6 94.4  PLT 256 273   Cardiac Enzymes: No results found for this basename: CKTOTAL, CKMB, CKMBINDEX, TROPONINI,  in the last 168 hours BNP (last 3 results)  Recent Labs  04/25/13 1745 08/01/13 1541  PROBNP 571.4* 139.0*   CBG: No results found for this basename: GLUCAP,  in the last 168 hours  Recent Results (from the past 240 hour(s))  MRSA PCR SCREENING     Status: None   Collection Time    03/14/14  6:17 AM      Result Value Ref Range Status   MRSA by PCR NEGATIVE  NEGATIVE Final   Comment:            The GeneXpert MRSA Assay (FDA     approved for  NASAL specimens     only), is one component of a     comprehensive MRSA colonization     surveillance program. It is not     intended to diagnose MRSA     infection nor to guide or     monitor treatment for     MRSA infections.     Studies: Dg Chest 2 View  03/13/2014   CLINICAL DATA:  Short of breath.  Cough with atrial fibrillation.  EXAM: CHEST  2 VIEW  COMPARISON:  11/24/2013  FINDINGS: Lateral view degraded by patient arm position. Mild motion degradation involving the lateral view as well. Midline trachea. Moderate cardiomegaly with a tortuous thoracic aorta. No congestive failure. No pleural effusion or pneumothorax. Mild right hemidiaphragm elevation. Mild volume loss and atelectasis in the right suprahilar region.  IMPRESSION: Cardiomegaly, without congestive failure or acute disease.    Electronically Signed   By: Abigail Miyamoto M.D.   On: 03/13/2014 22:01   Ct Head Wo Contrast  03/14/2014   CLINICAL DATA:  General fatigue since knee surgery this past Thursday.  EXAM: CT HEAD WITHOUT CONTRAST  TECHNIQUE: Contiguous axial images were obtained from the base of the skull through the vertex without intravenous contrast.  COMPARISON:  CT HEAD W/O CM dated 04/25/2010  FINDINGS: Moderate ventriculomegaly, likely on the basis of global parenchymal brain volume loss as there is overall commensurate enlargement of cerebral sulci and cerebellar folia. No intraparenchymal hemorrhage, mass effect nor midline shift. Confluent supratentorial white matter hypodensities are within normal range for patient's age and though non-specific suggest sequelae of chronic small vessel ischemic disease. No acute large vascular territory infarcts.  No abnormal extra-axial fluid collections. 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 Basal cisterns are patent. Moderate calcific atherosclerosis of the carotid siphons.  No skull fracture. The included ocular globes and orbital contents are non-suspicious. Small amount of air within the cavernous sinuses suggests recent intravenous access. Status post bilateral ocular lens implants. The mastoid aircells and included paranasal sinuses are well-aerated. Severe temporomandibular osteoarthrosis.  IMPRESSION: No acute intracranial process.  Moderate global brain atrophy, increased from prior examination with moderate to severe white matter changes, progressed.   Electronically Signed   By: Elon Alas   On: 03/14/2014 03:07   Ct Angio Chest Pe W/cm &/or Wo Cm  03/13/2014   CLINICAL DATA:  Tachycardia. Postoperative patient. Evaluate for pulmonary embolism.  EXAM: CT ANGIOGRAPHY CHEST WITH CONTRAST  TECHNIQUE: Multidetector CT imaging of the chest was performed using the standard protocol during bolus administration of intravenous contrast. Multiplanar  CT image reconstructions and MIPs were obtained to evaluate the vascular anatomy.  CONTRAST:  111mL OMNIPAQUE IOHEXOL 350 MG/ML SOLN  COMPARISON:  Chest CT 04/25/2013.  FINDINGS: Mediastinum: Study is significantly limited for assessment of pulmonary embolism by a large amount of patient respiratory motion. With these limitations in mind, there is no evidence of clinically significant central, lobar or segmental sized embolus. Smaller subsegmental sized emboli cannot be entirely excluded. The heart is enlarged, with marked dilatation of the right atrium and right ventricle, which appears to be chronic and is very similar to remote prior study 04/25/2013. There is atherosclerosis of the thoracic aorta, the great vessels of the mediastinum and the coronary arteries, including calcified atherosclerotic plaque in the left main, left anterior descending, left circumflex and right coronary arteries. In addition, there is ectasia of the ascending thoracic aorta which measures up to 4.2 cm in diameter. There is  no significant pericardial fluid, thickening or pericardial calcification. No pathologically enlarged mediastinal or hilar lymph nodes. Esophagus is unremarkable in appearance.  Lungs/Pleura: Trace right pleural effusion layering dependently. Areas of mild dependent subsegmental atelectasis throughout the lower lobes of the lungs bilaterally. No consolidative airspace disease. No definite suspicious appearing pulmonary nodules or masses.  Upper Abdomen: Atherosclerosis.  Musculoskeletal: Old healed fracture of the mid sternum incidentally noted. In addition, there is worsening compression of T12 related to an inferior endplate fracture, which does not appear acute, with approximately 30% loss of anterior vertebral body height. There are no aggressive appearing lytic or blastic lesions noted in the visualized portions of the skeleton.  Review of the MIP images confirms the above findings.  IMPRESSION: 1. Despite the  limitations of today's examination, there is no evidence to suggest clinically relevant central, lobar or segmental sized pulmonary embolism. 2. Trace right pleural effusion. Areas of mild dependent subsegmental atelectasis throughout the lower lobes of the lungs bilaterally. 3. Cardiomegaly with marked dilatation of the right atrium and right ventricle which appears to be chronic in this patient. 4. Atherosclerosis, including left main and 3 vessel coronary artery disease. Assessment for potential risk factor modification, dietary therapy or pharmacologic therapy may be warranted, if clinically indicated. 5. Additional findings, as above.   Electronically Signed   By: Vinnie Langton M.D.   On: 03/13/2014 23:08    Scheduled Meds: . sodium chloride   Intravenous STAT  . calcium-vitamin D  1 tablet Oral BID WC  . ceFEPime (MAXIPIME) IV  1 g Intravenous Q12H  . metoprolol  25 mg Oral BID  . polyethylene glycol  17 g Oral Daily  . rivaroxaban  20 mg Oral QAC supper  . sodium chloride  3 mL Intravenous Q12H   Continuous Infusions: . diltiazem (CARDIZEM) infusion 10 mg/hr (03/14/14 0836)    Principal Problem:   Encephalopathy acute Active Problems:   HYPERCHOLESTEROLEMIA, PURE   HYPERTENSION, ESSENTIAL NOS   PSVT   PE (pulmonary embolism)   UTI (lower urinary tract infection)   Acute encephalopathy    Time spent: >35 minutes     Kinnie Feil  [facility] Pager [phone]. If 7PM-7AM, please contact night-coverage at [email or web address], password [facility] 03/14/2014, 11:27 AM  LOS: 1 day

## 2014-03-15 DIAGNOSIS — Z9889 Other specified postprocedural states: Secondary | ICD-10-CM

## 2014-03-15 DIAGNOSIS — I498 Other specified cardiac arrhythmias: Secondary | ICD-10-CM

## 2014-03-15 DIAGNOSIS — I369 Nonrheumatic tricuspid valve disorder, unspecified: Secondary | ICD-10-CM

## 2014-03-15 DIAGNOSIS — F29 Unspecified psychosis not due to a substance or known physiological condition: Secondary | ICD-10-CM

## 2014-03-15 LAB — CBC
HCT: 38.5 % (ref 36.0–46.0)
Hemoglobin: 12.7 g/dL (ref 12.0–15.0)
MCH: 31 pg (ref 26.0–34.0)
MCHC: 33 g/dL (ref 30.0–36.0)
MCV: 93.9 fL (ref 78.0–100.0)
Platelets: 265 10*3/uL (ref 150–400)
RBC: 4.1 MIL/uL (ref 3.87–5.11)
RDW: 13.5 % (ref 11.5–15.5)
WBC: 7.5 10*3/uL (ref 4.0–10.5)

## 2014-03-15 LAB — URINE CULTURE: Colony Count: >=100000

## 2014-03-15 MED ORDER — METOPROLOL TARTRATE 50 MG PO TABS
50.0000 mg | ORAL_TABLET | Freq: Two times a day (BID) | ORAL | Status: DC
  Administered 2014-03-15 – 2014-03-16 (×3): 50 mg via ORAL
  Filled 2014-03-15 (×4): qty 1

## 2014-03-15 NOTE — Progress Notes (Signed)
  Echocardiogram 2D Echocardiogram has been performed.  Valinda Hoar 03/15/2014, 2:13 PM

## 2014-03-15 NOTE — Progress Notes (Signed)
SUBJECTIVE: The patient feels that she is improved today.  No further diarrhea, fewer tachycardia spells, shortness of breath improved.   She reports some delusions overnight  CURRENT MEDICATIONS: . calcium-vitamin D  1 tablet Oral BID WC  . ceFEPime (MAXIPIME) IV  1 g Intravenous Q12H  . metoprolol  25 mg Oral BID  . polyethylene glycol  17 g Oral Daily  . rivaroxaban  20 mg Oral QAC supper  . sodium chloride  3 mL Intravenous Q12H   . diltiazem (CARDIZEM) infusion 5 mg/hr (03/15/14 0010)    OBJECTIVE: Physical Exam: Filed Vitals:   03/14/14 1400 03/14/14 2014 03/14/14 2015 03/15/14 0515  BP: 114/56 116/54  128/69  Pulse: 77 61  68  Temp: 97.7 F (36.5 C) 98.3 F (36.8 C)  98.6 F (37 C)  TempSrc: Oral Oral  Oral  Resp: 20 18  20   Height:      Weight:    212 lb 1.3 oz (96.2 kg)  SpO2: 93% 86% 94% 92%    Intake/Output Summary (Last 24 hours) at 03/15/14 3154 Last data filed at 03/15/14 0086  Gross per 24 hour  Intake      3 ml  Output    700 ml  Net   -697 ml    Telemetry reveals predominately sinus rhythm with intermittent runs of atrial tach (improved in last 24 hours)  GEN- The patient is well appearing, alert and oriented x 3 today.   Head- normocephalic, atraumatic Eyes-  Sclera clear, conjunctiva pink Ears- hearing intact Oropharynx- clear Neck- supple, no JVP Lymph- no cervical lymphadenopathy Lungs- Clear to ausculation bilaterally, normal work of breathing Heart- Regular rate and rhythm, no murmurs, rubs or gallops, PMI not laterally displaced GI- soft, NT, ND, + BS Extremities- no clubbing, cyanosis, or edema Neuro- strength and sensation are intact  LABS: Basic Metabolic Panel:  Recent Labs  03/14/14 0315 03/14/14 0519  NA 134* 137  K 4.0 3.5*  CL 95* 96  CO2 26 28  GLUCOSE 123* 107*  BUN 25* 25*  CREATININE 0.69 0.70  CALCIUM 8.2* 8.5  MG 2.0  --    Liver Function Tests:  Recent Labs  03/14/14 0315  AST 25  ALT 17  ALKPHOS  66  BILITOT 0.7  PROT 6.5  ALBUMIN 2.6*   CBC:  Recent Labs  03/13/14 1956 03/14/14 0519  WBC 7.8 8.0  NEUTROABS 6.5  --   HGB 12.3 12.2  HCT 36.6 36.9  MCV 93.6 94.4  PLT 256 273   Fasting Lipid Panel:  Recent Labs  03/14/14 0519  CHOL 104  HDL 32*  LDLCALC 53  TRIG 95  CHOLHDL 3.3   Thyroid Function Tests:  Recent Labs  03/14/14 0315  TSH 3.600   Anemia Panel:  Recent Labs  03/14/14 0315  VITAMINB12 578    RADIOLOGY: Dg Chest 2 View 03/13/2014   CLINICAL DATA:  Short of breath.  Cough with atrial fibrillation.  EXAM: CHEST  2 VIEW  COMPARISON:  11/24/2013  FINDINGS: Lateral view degraded by patient arm position. Mild motion degradation involving the lateral view as well. Midline trachea. Moderate cardiomegaly with a tortuous thoracic aorta. No congestive failure. No pleural effusion or pneumothorax. Mild right hemidiaphragm elevation. Mild volume loss and atelectasis in the right suprahilar region.  IMPRESSION: Cardiomegaly, without congestive failure or acute disease.   Electronically Signed   By: Abigail Miyamoto M.D.   On: 03/13/2014 22:01    ASSESSMENT AND PLAN:  Active Problems:   HYPERCHOLESTEROLEMIA, PURE   HYPERTENSION, ESSENTIAL NOS   PSVT   PE (pulmonary embolism)   Encephalopathy acute   UTI (lower urinary tract infection)   Acute encephalopathy  1. SVT Improved I suspect as her medical issues improve that her SVT will also Increase metoprolol and stop cardizem drip Would defer considerations of ablation to the outpatient setting  2. Prior pte/ pulmonary htn Echo is pending

## 2014-03-15 NOTE — Progress Notes (Signed)
Clinical Social Work Department BRIEF PSYCHOSOCIAL ASSESSMENT 03/15/2014  Patient:  [name], [name]     Account Number:  [id number]     Admit date:  03/13/2014  Clinical Social Worker:  Adair Laundry  Date/Time:  03/15/2014 01:30 PM  Referred by:  Physician  Date Referred:  03/15/2014 Referred for  SNF Placement   Other Referral:   Interview type:  Patient Other interview type:   Spoke with pt and pt family at bedside    PSYCHOSOCIAL DATA Living Status:  FACILITY Admitted from facility:  [facility] Level of care:  [place] Primary support name:  Shauna Hugh [phone] Primary support relationship to patient:  CHILD, ADULT Degree of support available:   Pt has supportive family    CURRENT CONCERNS Current Concerns  Post-Acute Placement   Other Concerns:    SOCIAL WORK ASSESSMENT / PLAN CSW made aware that pt was admitted from facility. CSW visited pt room and spoke with pt, pt fiance, and pt daughter. Pt was at [facility] for only a few hours before being admitted to hospital. Pt at facility for [facility] re[facility] and pt and family agree plan will be to return when medically stable. They have no concerns in this regard at this time.    CSW spoke with facility and confirmed pt can return when ready.   Assessment/plan status:  Psychosocial Support/Ongoing Assessment of Needs Other assessment/ plan:   Information/referral to community resources:   None needed    PATIENT'S/FAMILY'S RESPONSE TO PLAN OF CARE: Pt and pt family agreeable to pt returning to SNF at dc to complete ST rehab        [place]erton Mount, [place]

## 2014-03-15 NOTE — Progress Notes (Signed)
Patient ID: [name]  female  DJS:[id number]    DOB: 1934-08-20    DOA: 03/13/2014  PCP: Loura Pardon, MD  Assessment/Plan: Principal Problem:   Acute encephalopathy Active Problems:   HYPERCHOLESTEROLEMIA, PURE   HYPERTENSION, ESSENTIAL NOS   PSVT   PE (pulmonary embolism)   Encephalopathy acute   UTI (lower urinary tract infection)   History of right knee joint replacement  80 y.o. 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 Acute encephalopathy acute: most likely toxic in nature secondary to UTI.  -neuro exam non focal; CT head: brain atrophy  -Continue IV antibiotics , Urine culture positive for Escherichia coli  2. PSVT, h/o PAF; ? Sinus node dysfunction; h/o bradycardia (BB stopped, but then resumed agian)  -HR improved on IV cardizem;  cardiology discontinued Cardizem drip today and placed on metoprolol  - consulted cardiology for ablation eval , per cardiology, defer considerations of ablation to the outpatient setting - 2-D echo pending  3. HTN , cont home regimen, monitor   4. H/o VTE/PE: neg CT angio no new PE  -continue xarelto   5. UTI (lower urinary tract infection):  -continue cefepime, urine culture positive for Escherichia coli   6. Patient reports 2 episodes of diarrhea ? Black stool,  -denies acute bleeding; check c diff; occult blood; f/u CBC in AM   7 right knee joint placement  - On xarelto for DVT prophylaxis, PTOT, will need STR     DVT Prophylaxis:On xarelto  Code Status:  Family Communication:Discussed with the patient in detail  Disposition:To skilled nursing facility, hopefully tomorrow  Consultants:  Cardiology  Procedures:  2-D echo pending  Antibiotics:  IV cefepime    Subjective: Patient seen and examined, denies any specific complaints at this time. At the time of my encounter, on  Cardizem drip  Objective: Weight change: 0.1 kg (3.5 oz)  Intake/Output Summary (Last 24 hours) at 03/15/14 1310 Last data filed at 03/15/14 0517  Gross per 24 hour  Intake      3 ml  Output    700 ml  Net   -697 ml   Blood pressure 102/59, pulse 83, temperature 98.6 F (37 C), temperature source Oral, resp. rate 20, height 5\' 6"  (1.676 m), weight 96.2 kg (212 lb 1.3 oz), SpO2 92.00%.  Physical Exam: General: Alert and awake, oriented x3, not in any acute distress. YIF:OYDXAJOINOM irregular  Chest: clear to auscultation bilaterally, no wheezing, rales or rhonchi Abdomen: soft nontender, nondistended, normal bowel sounds  Extremities: no cyanosis, clubbing or edema noted bilaterally, Right knee postsurgical, dressing intact   Lab Results: Basic Metabolic Panel:  Recent Labs Lab 03/14/14 0315 03/14/14 0519  NA 134* 137  K 4.0 3.5*  CL 95* 96  CO2 26 28  GLUCOSE 123* 107*  BUN 25* 25*  CREATININE 0.69 0.70  CALCIUM 8.2* 8.5  MG 2.0  --    Liver Function Tests:  Recent Labs Lab 03/14/14 0315  AST 25  ALT 17  ALKPHOS 66  BILITOT 0.7  PROT 6.5  ALBUMIN 2.6*   No results found for this basename: LIPASE, AMYLASE,  in the last 168 hours No results found for this basename: AMMONIA,  in the last 168 hours CBC:  Recent Labs Lab 03/13/14 1956 03/14/14 0519 03/15/14 0500  WBC 7.8 8.0 7.5  NEUTROABS 6.5  --   --  HGB 12.3 12.2 12.7  HCT 36.6 36.9 38.5  MCV 93.6 94.4 93.9  PLT 256 273 265   Cardiac Enzymes: No results found for this basename: CKTOTAL, CKMB, CKMBINDEX, TROPONINI,  in the last 168 hours BNP: No components found with this basename: POCBNP,  CBG: No results found for this basename: GLUCAP,  in the last 168 hours   Micro Results: Recent Results (from the past 240 hour(s))  URINE CULTURE     Status: None   Collection Time    03/13/14 11:05 PM      Result Value Ref Range Status   Specimen Description URINE, RANDOM   Final   Special  Requests NONE   Final   Culture  Setup Time     Final   Value: 03/14/2014 00:37     Performed at [facility]     Final   Value: >=100,000 COLONIES/ML     Performed at [facility]   Culture     Final   Value: ESCHERICHIA COLI     Performed at [facility]   Report Status 03/15/2014 FINAL   Final   Organism ID, Bacteria ESCHERICHIA COLI   Final  MRSA PCR SCREENING     Status: None   Collection Time    03/14/14  6:17 AM      Result Value Ref Range Status   MRSA by PCR NEGATIVE  NEGATIVE Final   Comment:            The GeneXpert MRSA Assay (FDA     approved for NASAL specimens     only), is one component of a     comprehensive MRSA colonization     surveillance program. It is not     intended to diagnose MRSA     infection nor to guide or     monitor treatment for     MRSA infections.    Studies/Results: Dg Chest 2 View  03/13/2014   CLINICAL DATA:  Short of breath.  Cough with atrial fibrillation.  EXAM: CHEST  2 VIEW  COMPARISON:  11/24/2013  FINDINGS: Lateral view degraded by patient arm position. Mild motion degradation involving the lateral view as well. Midline trachea. Moderate cardiomegaly with a tortuous thoracic aorta. No congestive failure. No pleural effusion or pneumothorax. Mild right hemidiaphragm elevation. Mild volume loss and atelectasis in the right suprahilar region.  IMPRESSION: Cardiomegaly, without congestive failure or acute disease.   Electronically Signed   By: Abigail Miyamoto M.D.   On: 03/13/2014 22:01   Ct Head Wo Contrast  03/14/2014   CLINICAL DATA:  General fatigue since knee surgery this past Thursday.  EXAM: CT HEAD WITHOUT CONTRAST  TECHNIQUE: Contiguous axial images were obtained from the base of the skull through the vertex without intravenous contrast.  COMPARISON:  CT HEAD W/O CM dated 04/25/2010  FINDINGS: Moderate ventriculomegaly, likely on the basis of global parenchymal brain volume loss as there is overall  commensurate enlargement of cerebral sulci and cerebellar folia. No intraparenchymal hemorrhage, mass effect nor midline shift. Confluent supratentorial white matter hypodensities are within normal range for patient's age and though non-specific suggest sequelae of chronic small vessel ischemic disease. No acute large vascular territory infarcts.  No abnormal extra-axial fluid collections. 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 Basal cisterns are patent. Moderate calcific atherosclerosis of the carotid siphons.  No skull fracture. The included ocular globes and orbital contents are non-suspicious. Small amount of  air within the cavernous sinuses suggests recent intravenous access. Status post bilateral ocular lens implants. The mastoid aircells and included paranasal sinuses are well-aerated. Severe temporomandibular osteoarthrosis.  IMPRESSION: No acute intracranial process.  Moderate global brain atrophy, increased from prior examination with moderate to severe white matter changes, progressed.   Electronically Signed   By: Elon Alas   On: 03/14/2014 03:07   Ct Angio Chest Pe W/cm &/or Wo Cm  03/13/2014   CLINICAL DATA:  Tachycardia. Postoperative patient. Evaluate for pulmonary embolism.  EXAM: CT ANGIOGRAPHY CHEST WITH CONTRAST  TECHNIQUE: Multidetector CT imaging of the chest was performed using the standard protocol during bolus administration of intravenous contrast. Multiplanar CT image reconstructions and MIPs were obtained to evaluate the vascular anatomy.  CONTRAST:  147mL OMNIPAQUE IOHEXOL 350 MG/ML SOLN  COMPARISON:  Chest CT 04/25/2013.  FINDINGS: Mediastinum: Study is significantly limited for assessment of pulmonary embolism by a large amount of patient respiratory motion. With these limitations in mind, there is no evidence of clinically significant central, lobar or segmental sized embolus. Smaller subsegmental sized emboli cannot be entirely excluded. The  heart is enlarged, with marked dilatation of the right atrium and right ventricle, which appears to be chronic and is very similar to remote prior study 04/25/2013. There is atherosclerosis of the thoracic aorta, the great vessels of the mediastinum and the coronary arteries, including calcified atherosclerotic plaque in the left main, left anterior descending, left circumflex and right coronary arteries. In addition, there is ectasia of the ascending thoracic aorta which measures up to 4.2 cm in diameter. There is no significant pericardial fluid, thickening or pericardial calcification. No pathologically enlarged mediastinal or hilar lymph nodes. Esophagus is unremarkable in appearance.  Lungs/Pleura: Trace right pleural effusion layering dependently. Areas of mild dependent subsegmental atelectasis throughout the lower lobes of the lungs bilaterally. No consolidative airspace disease. No definite suspicious appearing pulmonary nodules or masses.  Upper Abdomen: Atherosclerosis.  Musculoskeletal: Old healed fracture of the mid sternum incidentally noted. In addition, there is worsening compression of T12 related to an inferior endplate fracture, which does not appear acute, with approximately 30% loss of anterior vertebral body height. There are no aggressive appearing lytic or blastic lesions noted in the visualized portions of the skeleton.  Review of the MIP images confirms the above findings.  IMPRESSION: 1. Despite the limitations of today's examination, there is no evidence to suggest clinically relevant central, lobar or segmental sized pulmonary embolism. 2. Trace right pleural effusion. Areas of mild dependent subsegmental atelectasis throughout the lower lobes of the lungs bilaterally. 3. Cardiomegaly with marked dilatation of the right atrium and right ventricle which appears to be chronic in this patient. 4. Atherosclerosis, including left main and 3 vessel coronary artery disease. Assessment for  potential risk factor modification, dietary therapy or pharmacologic therapy may be warranted, if clinically indicated. 5. Additional findings, as above.   Electronically Signed   By: Vinnie Langton M.D.   On: 03/13/2014 23:08    Medications: Scheduled Meds: . calcium-vitamin D  1 tablet Oral BID WC  . ceFEPime (MAXIPIME) IV  1 g Intravenous Q12H  . metoprolol  50 mg Oral BID  . polyethylene glycol  17 g Oral Daily  . rivaroxaban  20 mg Oral QAC supper  . sodium chloride  3 mL Intravenous Q12H      LOS: 2 days   Ripudeep Krystal Eaton M.D. [facility] 03/15/2014, 1:10 PM Pager: [phone]  If 7PM-7AM, please contact night-coverage [email or web address] Password  [email or web address]  **Disclaimer: This note was dictated with voice recognition software. Similar sounding words can inadvertently be transcribed and this note may contain transcription errors which may not have been corrected upon publication of note.**

## 2014-03-15 NOTE — Progress Notes (Signed)
PT Cancellation Note  Patient Details Name: [name] MRN: [id number] DOB: 30-Jul-1934   Cancelled Treatment:    Reason Eval/Treat Not Completed: Patient at procedure or test/unavailable   Tonia Brooms Callaghan Laverdure 03/15/2014, 2:54 PM

## 2014-03-16 DIAGNOSIS — I82409 Acute embolism and thrombosis of unspecified deep veins of unspecified lower extremity: Secondary | ICD-10-CM

## 2014-03-16 MED ORDER — CEPHALEXIN 500 MG PO CAPS
500.0000 mg | ORAL_CAPSULE | Freq: Two times a day (BID) | ORAL | Status: DC
  Administered 2014-03-16: 500 mg via ORAL
  Filled 2014-03-16 (×2): qty 1

## 2014-03-16 MED ORDER — METOPROLOL TARTRATE 50 MG PO TABS: 50.0000 mg | ORAL_TABLET | Freq: Every day | ORAL | Status: DC

## 2014-03-16 MED ORDER — ONDANSETRON HCL 4 MG PO TABS: 4.0000 mg | ORAL_TABLET | Freq: Four times a day (QID) | ORAL | Status: DC | PRN

## 2014-03-16 MED ORDER — CEPHALEXIN 500 MG PO CAPS: 500.0000 mg | ORAL_CAPSULE | Freq: Two times a day (BID) | ORAL | Status: DC

## 2014-03-16 MED ORDER — OXYCODONE HCL 5 MG PO TABS: 5.0000 mg | ORAL_TABLET | Freq: Four times a day (QID) | ORAL | Status: DC | PRN

## 2014-03-16 MED ORDER — TRAMADOL HCL 50 MG PO TABS: 50.0000 mg | ORAL_TABLET | Freq: Four times a day (QID) | ORAL | Status: DC | PRN

## 2014-03-16 NOTE — Progress Notes (Signed)
CSW (Clinical Education officer, museum) prepared pt dc packet and placed with shadow chart. CSW arranged non-emergent ambulance transport for 1pm. Pt, pt nurse, and facility informed. Pt informed CSW she would inform her family. CSW signing off.   [place], [place]

## 2014-03-16 NOTE — Discharge Summary (Signed)
Physician Discharge Summary  Patient ID: AVAIYA SAWINSKI MRN: [id number] DOB/AGE: [birth year] 78 y.o.  Admit date: 03/13/2014 Discharge date: 03/16/2014  Primary Care Physician:  Loura Pardon, MD  Discharge Diagnoses:   Present on Admission:  . UTI (lower urinary tract infection) . Acute encephalopathy . paroxysmal SVT  . hypertension  . PE (pulmonary embolism) . HYPERCHOLESTEROLEMIA, PURE  history of right knee joint replacement  Consults:  Cardiology, Dr. Rayann Heman   Recommendations for Outpatient Follow-up:  Please follow outpatient with the orthopedic physician regarding further care about right knee joint replacement recently done at [facility]. Physical therapy per patient's orthopedics.   Allergies:   Allergies  Allergen Reactions  . Penicillins Hives  . Prednisone Other (See Comments)    Causes insomnia  . Sulfonamide Derivatives Hives  . Adhesive [Tape] Other (See Comments)    redness     Discharge Medications:   Medication List    STOP taking these medications       hydrochlorothiazide 25 MG tablet  Commonly known as:  HYDRODIURIL     HYDROmorphone 2 mg/mL in sodium chloride 0.9 %     ondansetron 4 MG/2ML Soln injection  Commonly known as:  ZOFRAN  Replaced by:  ondansetron 4 MG tablet      TAKE these medications       acetaminophen 500 MG tablet  Commonly known as:  TYLENOL  Take 2 tablets every 6 hours as needed for pain     alum & mag hydroxide-simeth 200-200-20 MG/5ML suspension  Commonly known as:  MAALOX/MYLANTA  Take 30 mLs by mouth every 4 (four) hours as needed for indigestion or heartburn.     ascorbic acid 500 MG tablet  Commonly known as:  VITAMIN C  take 1 tablet TID WC     bisacodyl 10 MG suppository  Commonly known as:  DULCOLAX  Use daily as needed for constipation     CALCIUM-VITAMIN D PO  Take 1 tablet by mouth 3 (three) times daily.     cephALEXin 500 MG capsule  Commonly known as:  KEFLEX  Take 1 capsule (500 mg  total) by mouth 2 (two) times daily. X 4 DAYS     diphenhydrAMINE 50 MG/ML injection  Commonly known as:  BENADRYL  Inject 25 mg into the vein every 6 (six) hours as needed for itching.     iron polysaccharides 150 MG capsule  Commonly known as:  NIFEREX  Take 150 mg by mouth daily.     metoprolol 50 MG tablet  Commonly known as:  LOPRESSOR  Take 1 tablet (50 mg total) by mouth daily.     multivitamin with minerals Tabs tablet  Take 1 tablet by mouth daily.     ondansetron 4 MG tablet  Commonly known as:  ZOFRAN  Take 1 tablet (4 mg total) by mouth every 6 (six) hours as needed for nausea.     oxyCODONE 5 MG immediate release tablet  Commonly known as:  Oxy IR/ROXICODONE  Take 1 tablet (5 mg total) by mouth every 6 (six) hours as needed for moderate pain or breakthrough pain.     polyethylene glycol packet  Commonly known as:  MIRALAX / GLYCOLAX  Take 17 g by mouth daily.     rivaroxaban 20 MG Tabs tablet  Commonly known as:  XARELTO  Take 1 tablet (20 mg total) by mouth daily.     sennosides-docusate sodium 8.6-50 MG tablet  Commonly known as:  SENOKOT-S  Take 1 tablet  by mouth daily.     sodium phosphate enema  Commonly known as:  FLEET  Place 1 enema rectally once as needed.     traMADol 50 MG tablet  Commonly known as:  ULTRAM  Take 1-2 tablets (50-100 mg total) by mouth every 6 (six) hours as needed for moderate pain or severe pain.     verapamil 120 MG CR tablet  Commonly known as:  CALAN-SR  Take 1 tablet (120 mg total) by mouth at bedtime.         Brief H and P: For complete details please refer to admission H and P, but in brief [name] is a 80 y.o. female medical history significant for hypertension, hyperlipidemia, paroxysmal supraventricular tachycardia, history of pulmonary embolism (on xarelto), pulmonary hypertension and right ventricular failure; came to the ED from a skilled nursing facility (In place where she reside) due to  tachycardia/presumed A. Fib and also mild changes in her mentation. Patient reports that she had not been eating and drinking properly for the last 2 days or so due to decreased appetite and just feeling fatigue/weak. Patient denies any chest pain, shortness of breath, fever, chills, headache, nausea, vomiting or any other acute complaints.  Of note, patient had a recent right total knee replacement at [facility], no complications otherwise rehabilitating properly according to records. In the ED patient was found to be tachycardic with a heart rate ranging from 90's to 140; urinalysis suggesting UTI and mild dehydration (increased and specific gravity).   Hospital Course:   80 y.o. 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 Acute encephalopathy acute: most likely toxic/infectious in nature secondary to UTI. Completely resolved, patient is currently at her baseline mental status. Neurology examination was nonfocal. CT head did not show any acute infarct, showed moderate global brain atrophy, moderate to severe white matter changes. Urine culture was positive for Escherichia coli. Patient was treated for her UTI.  2. PSVT, h/o PAF; ? Sinus node dysfunction; h/o bradycardia (BB stopped, but then resumed agian)  Patient was initially placed on IV Cardizem. Cardiology was consulted and once her heart rate was improved Cardizem drip was discontinued and patient was placed on metoprolol and the dose was increased. Per Dr. Rayann Heman,  defer considerations of ablation to the outpatient setting.  2-D echo showed EF of 55-60%, RV volume and pressure overload with hypokinesis of RV lateral free wall. Patient has a history of PE, CT angiogram of the chest showed no new pulmonary embolism.   3. HTN , cont home regimen, monitor   4. H/o VTE/PE: neg CT angio no new PE   -continue xarelto   5. UTI (lower urinary tract infection):  Patient was placed on IV cefepime at the time of admission. Urine culture is positive for Escherichia coli. Antibiotics transitioned to Keflex but the sensitivities for another 4 days.  6 right knee joint placement  - On xarelto for DVT prophylaxis.  please check with her orthopedic physician at [facility] for further instructions regarding followup appointment and physical therapy.   Day of Discharge BP 129/65  Pulse 54  Temp(Src) 98 F (36.7 C) (Oral)  Resp 20  Ht 5\' 6"  (1.676 m)  Wt 96.345 kg (212 lb 6.4 oz)  BMI 34.30 kg/m2  SpO2 94%  Physical Exam:  General: Alert and awake, oriented x3, not in  any acute distress.  SD:[id number] irregular  Chest: clear to auscultation bilaterally, no wheezing, rales or rhonchi  Abdomen: soft nontender, nondistended, normal bowel sounds  Extremities: no cyanosis, clubbing or edema noted bilaterally, Right knee postsurgical, dressing intact     The results of significant diagnostics from this hospitalization (including imaging, microbiology, ancillary and laboratory) are listed below for reference.    LAB RESULTS: Basic Metabolic Panel:  Recent Labs Lab 03/14/14 0315 03/14/14 0519  NA 134* 137  K 4.0 3.5*  CL 95* 96  CO2 26 28  GLUCOSE 123* 107*  BUN 25* 25*  CREATININE 0.69 0.70  CALCIUM 8.2* 8.5  MG 2.0  --    Liver Function Tests:  Recent Labs Lab 03/14/14 0315  AST 25  ALT 17  ALKPHOS 66  BILITOT 0.7  PROT 6.5  ALBUMIN 2.6*   No results found for this basename: LIPASE, AMYLASE,  in the last 168 hours No results found for this basename: AMMONIA,  in the last 168 hours CBC:  Recent Labs Lab 03/13/14 1956 03/14/14 0519 03/15/14 0500  WBC 7.8 8.0 7.5  NEUTROABS 6.5  --   --   HGB 12.3 12.2 12.7  HCT 36.6 36.9 38.5  MCV 93.6 94.4 93.9  PLT 256 273 265   Cardiac Enzymes: No results found for this basename: CKTOTAL, CKMB, CKMBINDEX,  TROPONINI,  in the last 168 hours BNP: No components found with this basename: POCBNP,  CBG: No results found for this basename: GLUCAP,  in the last 168 hours  Significant Diagnostic Studies:  Dg Chest 2 View  03/13/2014   CLINICAL DATA:  Short of breath.  Cough with atrial fibrillation.  EXAM: CHEST  2 VIEW  COMPARISON:  11/24/2013  FINDINGS: Lateral view degraded by patient arm position. Mild motion degradation involving the lateral view as well. Midline trachea. Moderate cardiomegaly with a tortuous thoracic aorta. No congestive failure. No pleural effusion or pneumothorax. Mild right hemidiaphragm elevation. Mild volume loss and atelectasis in the right suprahilar region.  IMPRESSION: Cardiomegaly, without congestive failure or acute disease.   Electronically Signed   By: Abigail Miyamoto M.D.   On: 03/13/2014 22:01   Ct Head Wo Contrast  03/14/2014   CLINICAL DATA:  General fatigue since knee surgery this past Thursday.  EXAM: CT HEAD WITHOUT CONTRAST  TECHNIQUE: Contiguous axial images were obtained from the base of the skull through the vertex without intravenous contrast.  COMPARISON:  CT HEAD W/O CM dated 04/25/2010  FINDINGS: Moderate ventriculomegaly, likely on the basis of global parenchymal brain volume loss as there is overall commensurate enlargement of cerebral sulci and cerebellar folia. No intraparenchymal hemorrhage, mass effect nor midline shift. Confluent supratentorial white matter hypodensities are within normal range for patient's age and though non-specific suggest sequelae of chronic small vessel ischemic disease. No acute large vascular territory infarcts.  No abnormal extra-axial fluid collections. 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 Basal cisterns are patent. Moderate calcific atherosclerosis of the carotid siphons.  No skull fracture. The included ocular globes and orbital contents are non-suspicious. Small amount of air within the cavernous sinuses  suggests recent intravenous access. Status post bilateral ocular lens implants. The mastoid aircells and included paranasal sinuses are well-aerated. Severe temporomandibular osteoarthrosis.  IMPRESSION: No acute intracranial process.  Moderate global brain atrophy, increased from prior examination with moderate to severe white matter changes, progressed.   Electronically Signed   By: Elon Alas   On: 03/14/2014 03:07   Ct  Angio Chest Pe W/cm &/or Wo Cm  03/13/2014   CLINICAL DATA:  Tachycardia. Postoperative patient. Evaluate for pulmonary embolism.  EXAM: CT ANGIOGRAPHY CHEST WITH CONTRAST  TECHNIQUE: Multidetector CT imaging of the chest was performed using the standard protocol during bolus administration of intravenous contrast. Multiplanar CT image reconstructions and MIPs were obtained to evaluate the vascular anatomy.  CONTRAST:  160mL OMNIPAQUE IOHEXOL 350 MG/ML SOLN  COMPARISON:  Chest CT 04/25/2013.  FINDINGS: Mediastinum: Study is significantly limited for assessment of pulmonary embolism by a large amount of patient respiratory motion. With these limitations in mind, there is no evidence of clinically significant central, lobar or segmental sized embolus. Smaller subsegmental sized emboli cannot be entirely excluded. The heart is enlarged, with marked dilatation of the right atrium and right ventricle, which appears to be chronic and is very similar to remote prior study 04/25/2013. There is atherosclerosis of the thoracic aorta, the great vessels of the mediastinum and the coronary arteries, including calcified atherosclerotic plaque in the left main, left anterior descending, left circumflex and right coronary arteries. In addition, there is ectasia of the ascending thoracic aorta which measures up to 4.2 cm in diameter. There is no significant pericardial fluid, thickening or pericardial calcification. No pathologically enlarged mediastinal or hilar lymph nodes. Esophagus is unremarkable in  appearance.  Lungs/Pleura: Trace right pleural effusion layering dependently. Areas of mild dependent subsegmental atelectasis throughout the lower lobes of the lungs bilaterally. No consolidative airspace disease. No definite suspicious appearing pulmonary nodules or masses.  Upper Abdomen: Atherosclerosis.  Musculoskeletal: Old healed fracture of the mid sternum incidentally noted. In addition, there is worsening compression of T12 related to an inferior endplate fracture, which does not appear acute, with approximately 30% loss of anterior vertebral body height. There are no aggressive appearing lytic or blastic lesions noted in the visualized portions of the skeleton.  Review of the MIP images confirms the above findings.  IMPRESSION: 1. Despite the limitations of today's examination, there is no evidence to suggest clinically relevant central, lobar or segmental sized pulmonary embolism. 2. Trace right pleural effusion. Areas of mild dependent subsegmental atelectasis throughout the lower lobes of the lungs bilaterally. 3. Cardiomegaly with marked dilatation of the right atrium and right ventricle which appears to be chronic in this patient. 4. Atherosclerosis, including left main and 3 vessel coronary artery disease. Assessment for potential risk factor modification, dietary therapy or pharmacologic therapy may be warranted, if clinically indicated. 5. Additional findings, as above.   Electronically Signed   By: Vinnie Langton M.D.   On: 03/13/2014 23:08    2D ECHO:  Study Conclusions  - Left ventricle: The cavity size was normal. Wall thickness was normal. Systolic function was normal. The estimated ejection fraction was in the range of 55% to 60%. Wall motion was normal; there were no regional wall motion abnormalities. Left ventricular diastolic function parameters were normal. - Ventricular septum: The contour showed diastolic flattening and systolic flattening. These changes are consistent  with RV volume and pressure overload. - Right ventricle: The cavity size was moderately dilated. Systolic function was mildly reduced. Hypokinesis of the RV lateral free wall. - Right atrium: The atrium was severely dilated. - Atrial septum: No defect or patent foramen ovale was identified. - Tricuspid valve: There was moderate-severe regurgitation directed centrally. - Pulmonary arteries: Systolic pressure was severely increased. PA peak pressure: 73 mm Hg (S).  Impressions:  - Systolic PA pressure is higher than on the July 2014 study, but similar  to the January 2014 study.   Disposition and Follow-up:     Discharge Instructions   Diet - low sodium heart healthy    Complete by:  As directed      Increase activity slowly    Complete by:  As directed             DISPOSITION:Skilled nursing facility  DIET: Heart healthy   DISCHARGE FOLLOW-UP Follow-up Information   Follow up with Loura Pardon, MD. Schedule an appointment as soon as possible for a visit in 10 days. (for hospital follow-up)    Specialties:  Family Medicine, Radiology   Contact information:   [address] [place] [address] [phone]       Follow up with Thompson Grayer, MD In 2 weeks. (for hospital follow-up)    Specialty:  Cardiology   Contact information:   [place] [address] [phone]       Time spent on Discharge: 45 mins  Signed:   Mendel Corning M.D. [facility] 03/16/2014, 10:06 AM Pager: [phone]   **Disclaimer: This note was dictated with voice recognition software. Similar sounding words can inadvertently be transcribed and this note may contain transcription errors which may not have been corrected upon publication of note.**

## 2014-03-17 ENCOUNTER — Telehealth: Payer: Self-pay | Admitting: Family Medicine

## 2014-03-17 ENCOUNTER — Non-Acute Institutional Stay (SKILLED_NURSING_FACILITY): Payer: Medicare Other | Admitting: Adult Health

## 2014-03-17 ENCOUNTER — Encounter: Payer: Self-pay | Admitting: Adult Health

## 2014-03-17 DIAGNOSIS — Z96659 Presence of unspecified artificial knee joint: Secondary | ICD-10-CM

## 2014-03-17 DIAGNOSIS — I471 Supraventricular tachycardia: Secondary | ICD-10-CM

## 2014-03-17 DIAGNOSIS — D62 Acute posthemorrhagic anemia: Secondary | ICD-10-CM | POA: Insufficient documentation

## 2014-03-17 DIAGNOSIS — Z96651 Presence of right artificial knee joint: Secondary | ICD-10-CM

## 2014-03-17 DIAGNOSIS — I2699 Other pulmonary embolism without acute cor pulmonale: Secondary | ICD-10-CM

## 2014-03-17 DIAGNOSIS — N390 Urinary tract infection, site not specified: Secondary | ICD-10-CM

## 2014-03-17 DIAGNOSIS — I10 Essential (primary) hypertension: Secondary | ICD-10-CM

## 2014-03-17 NOTE — Telephone Encounter (Signed)
Spoke with pt's daughter, Federico Flake.  Pt has returned to SNF and she is not sure when pt will d/c home.  She will call to make f/u appt once pt is being d/c'd from SNF.

## 2014-03-17 NOTE — Telephone Encounter (Signed)
Transitional Care Call attempted.  Left vm for pt's daughter to return call.

## 2014-03-17 NOTE — Telephone Encounter (Signed)
That sounds fine

## 2014-03-17 NOTE — Progress Notes (Signed)
Patient ID: [name], female   DOB: 07-04-1934, 80 y.o.   MRN: [id number]               PROGRESS NOTE  DATE: 03/17/2014  FACILITY: Nursing Home Location: [facility]  LEVEL OF CARE: SNF (31)  Acute Visit  CHIEF COMPLAINT:  Follow-up Hospitalization  HISTORY OF PRESENT ILLNESS: This is a 80 year old female who has been admitted to [facility] on 03/16/14 from [facility] with Acute encephalopathy most likely de to UTI,  PSVT and a recent right total knee replacement. She has been admitted for a short-term rehabilitation.  REASSESSMENT OF ONGOING PROBLEM(S):  HTN: Pt 's HTN remains stable.  Denies CP, sob, DOE, pedal edema, headaches, dizziness or visual disturbances.  No complications from the medications currently being used.  Last BP : 116/63  UTI: The UTI remains stable.  The patient denies ongoing suprapubic pain, flank pain, dysuria, urinary frequency, urinary hesitancy or hematuria.  No complications reported from the current antibiotic being used.  PULMONARY EMBOLISM: The pulmonary embolism remains stable. Patient is currently on anticoagulation.  Patient denies chest pain or shortness of breath. No complications reported from the anti-coagulation currently being used.  PAST MEDICAL HISTORY : Reviewed.  No changes/see problem list  CURRENT MEDICATIONS: Reviewed per MAR/see medication list  REVIEW OF SYSTEMS:  GENERAL: no change in appetite, no fatigue, no weight changes, no fever, chills or weakness RESPIRATORY: no cough, SOB, DOE, wheezing, hemoptysis CARDIAC: no chest pain, edema or palpitations GI: no abdominal pain, diarrhea, constipation, heart burn, nausea or vomiting  PHYSICAL EXAMINATION  GENERAL: no acute distress EYES: conjunctivae normal, sclerae normal, normal eye lids NECK: supple, trachea midline, no neck masses, no thyroid tenderness, no thyromegaly LYMPHATICS: no LAN in the neck, no supraclavicular LAN RESPIRATORY: breathing  is even & unlabored, BS CTAB CARDIAC: irregularly irregular, no murmur,no extra heart sounds, no edema GI: abdomen soft, normal BS, no masses, no tenderness, no hepatomegaly, no splenomegaly EXTREMITIES: able to move all 4 extremities PSYCHIATRIC: the patient is alert & oriented to person, affect & behavior appropriate  LABS/RADIOLOGY: Labs reviewed: Basic Metabolic Panel:  Recent Labs  03/13/14 1956 03/14/14 0315 03/14/14 0519  NA 134* 134* 137  K 3.8 4.0 3.5*  CL 96 95* 96  CO2 26 26 28   GLUCOSE 124* 123* 107*  BUN 26* 25* 25*  CREATININE 0.72 0.69 0.70  CALCIUM 8.4 8.2* 8.5  MG  --  2.0  --    Liver Function Tests:  Recent Labs  03/14/14 0315  AST 25  ALT 17  ALKPHOS 66  BILITOT 0.7  PROT 6.5  ALBUMIN 2.6*   CBC:  Recent Labs  03/13/14 1956 03/14/14 0519 03/15/14 0500  WBC 7.8 8.0 7.5  NEUTROABS 6.5  --   --   HGB 12.3 12.2 12.7  HCT 36.6 36.9 38.5  MCV 93.6 94.4 93.9  PLT 256 273 265    Lipid Panel:  Recent Labs  03/14/14 0519  HDL 32*   Cardiac Enzymes:  Recent Labs  04/26/13 0440 04/26/13 1105  TROPONINI <0.30 <0.30    ASSESSMENT/PLAN:  PSVT - rate-controlled; continue Lopressor and verapamil UTI - continue Keflex Anemia - stable; continue Niferex Hypertension - well-controlled S/P right total knee replacement - for rehabilitation Hx of PE - continue Xarelto   CPT CODE: 02409   [name] - NP  [facility] [phone]

## 2014-03-21 ENCOUNTER — Non-Acute Institutional Stay (SKILLED_NURSING_FACILITY): Payer: Medicare Other | Admitting: Internal Medicine

## 2014-03-21 DIAGNOSIS — IMO0002 Reserved for concepts with insufficient information to code with codable children: Secondary | ICD-10-CM

## 2014-03-21 DIAGNOSIS — I2699 Other pulmonary embolism without acute cor pulmonale: Secondary | ICD-10-CM

## 2014-03-21 DIAGNOSIS — M1710 Unilateral primary osteoarthritis, unspecified knee: Secondary | ICD-10-CM

## 2014-03-21 DIAGNOSIS — N390 Urinary tract infection, site not specified: Secondary | ICD-10-CM

## 2014-03-21 DIAGNOSIS — I10 Essential (primary) hypertension: Secondary | ICD-10-CM

## 2014-03-21 NOTE — Progress Notes (Signed)
HISTORY & PHYSICAL  DATE: 03/21/2014   FACILITY: [facility]  LEVEL OF CARE: SNF (31)  ALLERGIES:  Allergies  Allergen Reactions  . Penicillins Hives  . Prednisone Other (See Comments)    Causes insomnia  . Sulfonamide Derivatives Hives  . Adhesive [Tape] Other (See Comments)    redness    CHIEF COMPLAINT:  Manage UTI, hypertension and PE  HISTORY OF PRESENT ILLNESS: 80 year old Caucasian female was hospitalized secondary to acute encephalopathy and after hospitalization she is admitted to this facility for short-term rehabilitation.  UTI: The UTI remains stable.  The patient denies ongoing suprapubic pain, flank pain, dysuria, urinary hesitancy or hematuria.  No complications reported from the current antibiotic being used. Patient is complaining of new onset urinary frequency again.  HTN: Pt 's HTN remains stable.  Denies CP, sob, DOE, headaches, dizziness or visual disturbances.  No complications from the medications currently being used.  Last BP : 121/63.  PULMONARY EMBOLISM: The pulmonary embolism remains stable. Patient is currently on anticoagulation.  Patient denies chest pain or shortness of breath. No complications reported from the anti-coagulation currently being used.  PAST MEDICAL HISTORY :  Past Medical History  Diagnosis Date  . Osteopenia   . Hyperlipidemia   . Hypertension   . PSVT (paroxysmal supraventricular tachycardia)   . Spinal stenosis   . Overactive bladder     urge incontinence  . Pulmonary emboli   . Pulmonary hypertension   . Right ventricular failure   . Arthritis     knee    PAST SURGICAL HISTORY: Past Surgical History  Procedure Laterality Date  . Cataract extraction  2008  . Bladder suspension  1993  . Joint replacement  2000    left TKR  . Total knee revision  01/12/2012    Procedure: TOTAL KNEE REVISION;  Surgeon: Kerin Salen, MD;  Location: [facility];  Service: Orthopedics;  Laterality: Left;   DEPUY/ LCS , HAND SET  . Trigger finger release  01/12/2012    Procedure: RELEASE TRIGGER FINGER/A-1 PULLEY;  Surgeon: Kerin Salen, MD;  Location: [facility];  Service: Orthopedics;  Laterality: Right;  . Cholecystectomy    . Hernia repair    . Total knee arthroplasty  10/29/2012    at [place]:  reports that she has never smoked. She does not have any smokeless tobacco history on file. She reports that she does not drink alcohol or use illicit drugs.  FAMILY HISTORY:  Family History  Problem Relation Age of Onset  . Cancer Mother     breast CA  . Heart disease Father     MI  . Diabetes Father   . Hypertension Father   . Cancer Sister     uterine CA    CURRENT MEDICATIONS: Reviewed per MAR/see medication list  REVIEW OF SYSTEMS:  See HPI otherwise 14 point ROS is negative.  PHYSICAL EXAMINATION  VS:  See VS section  GENERAL: no acute distress, moderately obese body habitus EYES: conjunctivae normal, sclerae normal, normal eye lids MOUTH/THROAT: lips without lesions,no lesions in the mouth,tongue is without lesions,uvula elevates in midline NECK: supple, trachea midline, no neck masses, no thyroid tenderness, no thyromegaly LYMPHATICS: no LAN in the neck, no supraclavicular LAN RESPIRATORY: breathing is even & unlabored, BS CTAB CARDIAC: RRR, no murmur,no extra heart sounds, right lower extremity +2 edema, left lower extremity +1 edema GI:  ABDOMEN: abdomen soft, normal  BS, no masses, no tenderness  LIVER/SPLEEN: no hepatomegaly, no splenomegaly MUSCULOSKELETAL: HEAD: normal to inspection  EXTREMITIES: LEFT UPPER EXTREMITY: full range of motion, normal strength & tone RIGHT UPPER EXTREMITY:  full range of motion, normal strength & tone LEFT LOWER EXTREMITY:  full range of motion, normal strength & tone RIGHT LOWER EXTREMITY:  range of motion not tested due to surgery, normal strength & tone PSYCHIATRIC: the patient is alert & oriented to person, affect &  behavior appropriate  LABS/RADIOLOGY:  Labs reviewed: Basic Metabolic Panel:  Recent Labs  03/13/14 1956 03/14/14 0315 03/14/14 0519  NA 134* 134* 137  K 3.8 4.0 3.5*  CL 96 95* 96  CO2 26 26 28   GLUCOSE 124* 123* 107*  BUN 26* 25* 25*  CREATININE 0.72 0.69 0.70  CALCIUM 8.4 8.2* 8.5  MG  --  2.0  --    Liver Function Tests:  Recent Labs  03/14/14 0315  AST 25  ALT 17  ALKPHOS 66  BILITOT 0.7  PROT 6.5  ALBUMIN 2.6*   CBC:  Recent Labs  03/13/14 1956 03/14/14 0519 03/15/14 0500  WBC 7.8 8.0 7.5  NEUTROABS 6.5  --   --   HGB 12.3 12.2 12.7  HCT 36.6 36.9 38.5  MCV 93.6 94.4 93.9  PLT 256 273 265   Lipid Panel:  Recent Labs  03/14/14 0519  HDL 32*   Cardiac Enzymes:  Recent Labs  04/26/13 0440 04/26/13 1105  TROPONINI <0.30 <0.30    Transthoracic Echocardiography  Patient:    Sarya, Linenberger MR #:       [id number] Study Date: 03/15/2014 Gender:     F Age:        13 Height:     167.6 cm Weight:     96.2 kg BSA:        2.15 m^2 Pt. Status: Room:       [id number]   SONOGRAPHER  [facility]     Thompson Grayer, MD  ATTENDING    Rai, Ripudeep K  PERFORMING   [facility], Inpatient  cc:  ------------------------------------------------------------------- LV EF: 55% -   60%  ------------------------------------------------------------------- Indications:      Tachycardia 785.0.  ------------------------------------------------------------------- History:   PMH:  Acute encephalopathy. Right knee joint replacement. Spinal stenosis. Pulmonary hypertension.  Risk factors:  Hypertension. Dyslipidemia.  ------------------------------------------------------------------- Study Conclusions  - Left ventricle: The cavity size was normal. Wall thickness was   normal. Systolic function was normal. The estimated ejection   fraction was in the range of 55% to 60%. Wall motion was normal;   there were no regional  wall motion abnormalities. Left   ventricular diastolic function parameters were normal. - Ventricular septum: The contour showed diastolic flattening and   systolic flattening. These changes are consistent with RV volume   and pressure overload. - Right ventricle: The cavity size was moderately dilated. Systolic   function was mildly reduced. Hypokinesis of the RV lateral free   wall. - Right atrium: The atrium was severely dilated. - Atrial septum: No defect or patent foramen ovale was identified. - Tricuspid valve: There was moderate-severe regurgitation directed   centrally. - Pulmonary arteries: Systolic pressure was severely increased. PA   peak pressure: 73 mm Hg (S).  Impressions:  - Systolic PA pressure is higher than on the July 2014 study, but   similar to the January 2014 study.  -------------------------------------------------------------------  ------------------------------------------------------------------- Left ventricle:  The cavity size  was normal. Wall thickness was normal. Systolic function was normal. The estimated ejection fraction was in the range of 55% to 60%. Wall motion was normal; there were no regional wall motion abnormalities. The transmitral flow pattern was normal. The deceleration time of the early transmitral flow velocity was normal. The pulmonary vein flow pattern was normal. The tissue Doppler parameters were normal. Left ventricular diastolic function parameters were normal.  ------------------------------------------------------------------- Aortic valve:   Structurally normal valve. Trileaflet. Cusp separation was normal.  Doppler:  Transvalvular velocity was within the normal range. There was no stenosis. There was no regurgitation.  ------------------------------------------------------------------- Aorta:  Aortic root: The aortic root was normal in size. Ascending aorta: The ascending aorta was normal in  size.  ------------------------------------------------------------------- Mitral valve:   Structurally normal valve.   Leaflet separation was normal.  Doppler:  Transvalvular velocity was within the normal range. There was no evidence for stenosis. There was no regurgitation.  ------------------------------------------------------------------- Left atrium:  The atrium was normal in size.  ------------------------------------------------------------------- Atrial septum:  No defect or patent foramen ovale was identified.   ------------------------------------------------------------------- Right ventricle:  The cavity size was moderately dilated. Systolic function was mildly reduced.  Hypokinesis of the RV lateral free wall.  ------------------------------------------------------------------- Ventricular septum:   The contour showed diastolic flattening and systolic flattening. These changes are consistent with RV volume and pressure overload.  ------------------------------------------------------------------- Pulmonic valve:   Poorly visualized.  The valve appears to be grossly normal.  ------------------------------------------------------------------- Tricuspid valve:   Structurally normal valve.   Leaflet separation was normal.  Doppler:  Transvalvular velocity was within the normal range. There was moderate-severe regurgitation directed centrally.   ------------------------------------------------------------------- Pulmonary artery:   Systolic pressure was severely increased.  ------------------------------------------------------------------- Right atrium:  The atrium was severely dilated.  ------------------------------------------------------------------- Pericardium:  There was no pericardial effusion.  ------------------------------------------------------------------- Systemic veins: Inferior vena cava: The vessel was dilated. The respirophasic diameter  changes were blunted (< 50%), consistent with elevated central venous pressure.  ------------------------------------------------------------------- Prepared and Electronically Authenticated by  Sanda Klein, MD 2015-05-20T16:08:38  ------------------------------------------------------------------- Measurements   Left ventricle                 Value        05/05/2013 Reference  LV ID, ED, PLAX        (L)     4.1   cm     4.7        43.0 - 52.0  chordal  LV ID, ES, PLAX        (L)     3.3   cm     3.0        23.0 - 38.0  chordal  LV fx shortening, PLAX (L)     21    %      36         >=29  chordal  LV PW thickness, ED            1.0   cm     1.2        -----------  IVS/LV PW ratio, ED    (N)     0.97         1          <=1.3    Ventricular septum             Value        05/05/2013 Reference  IVS thickness, ED  1.0   cm     1.2        -----------    Aorta                          Value        05/05/2013 Reference  Aortic root ID, ED             3.5   cm     3.6        -----------    Left atrium                    Value        05/05/2013 Reference  LA ID, A-P, ES                 3.2   cm     4.4        -----------  LA ID/bsa, A-P         (N)     1.5   cm/m^2 2.2        <=2.2    Mitral valve                   Value        05/05/2013 Reference  Mitral E-wave peak             0.13  m/sec  0.69       -----------  velocity  Mitral A-wave peak             0.78  m/sec  0.66       -----------  velocity  Mitral deceleration    (H)     257   ms     342        150 - 230  time  Mitral E/A ratio, peak         0.6          1.1        -----------    Pulmonary arteries             Value        05/05/2013 Reference  PA pressure, S, DP     (H)     73    mm Hg  ---------- <=30    Tricuspid valve                Value        05/05/2013 Reference  Tricuspid regurg peak          3.8   m/sec  3          -----------  velocity  Tricuspid peak RV-RA           58    mm Hg  36          -----------  gradient  Tricuspid maximal              3.72  m/sec  ---------- -----------  regurg velocity, PISA    Systemic veins                 Value        05/05/2013 Reference  Estimated CVP                  15    mm Hg  10         -----------    Right ventricle  Value        05/05/2013 Reference  RV pressure, S, DP     (H)     73    mm Hg  46         <=30  Legend: (L)  and  (H)  mark values outside specified reference range.  (N)  marks values inside specified reference range.      CHEST  2 VIEW   COMPARISON:  11/24/2013   FINDINGS: Lateral view degraded by patient arm position. Mild motion degradation involving the lateral view as well. Midline trachea. Moderate cardiomegaly with a tortuous thoracic aorta. No congestive failure. No pleural effusion or pneumothorax. Mild right hemidiaphragm elevation. Mild volume loss and atelectasis in the right suprahilar region.   IMPRESSION: Cardiomegaly, without congestive failure or acute disease.   CT ANGIOGRAPHY CHEST WITH CONTRAST   TECHNIQUE: Multidetector CT imaging of the chest was performed using the standard protocol during bolus administration of intravenous contrast. Multiplanar CT image reconstructions and MIPs were obtained to evaluate the vascular anatomy.   CONTRAST:  165mL OMNIPAQUE IOHEXOL 350 MG/ML SOLN   COMPARISON:  Chest CT 04/25/2013.   FINDINGS: Mediastinum: Study is significantly limited for assessment of pulmonary embolism by a large amount of patient respiratory motion. With these limitations in mind, there is no evidence of clinically significant central, lobar or segmental sized embolus. Smaller subsegmental sized emboli cannot be entirely excluded. The heart is enlarged, with marked dilatation of the right atrium and right ventricle, which appears to be chronic and is very similar to remote prior study 04/25/2013. There is atherosclerosis of the thoracic aorta, the great  vessels of the mediastinum and the coronary arteries, including calcified atherosclerotic plaque in the left main, left anterior descending, left circumflex and right coronary arteries. In addition, there is ectasia of the ascending thoracic aorta which measures up to 4.2 cm in diameter. There is no significant pericardial fluid, thickening or pericardial calcification. No pathologically enlarged mediastinal or hilar lymph nodes. Esophagus is unremarkable in appearance.   Lungs/Pleura: Trace right pleural effusion layering dependently. Areas of mild dependent subsegmental atelectasis throughout the lower lobes of the lungs bilaterally. No consolidative airspace disease. No definite suspicious appearing pulmonary nodules or masses.   Upper Abdomen: Atherosclerosis.   Musculoskeletal: Old healed fracture of the mid sternum incidentally noted. In addition, there is worsening compression of T12 related to an inferior endplate fracture, which does not appear acute, with approximately 30% loss of anterior vertebral body height. There are no aggressive appearing lytic or blastic lesions noted in the visualized portions of the skeleton.   Review of the MIP images confirms the above findings.   IMPRESSION: 1. Despite the limitations of today's examination, there is no evidence to suggest clinically relevant central, lobar or segmental sized pulmonary embolism. 2. Trace right pleural effusion. Areas of mild dependent subsegmental atelectasis throughout the lower lobes of the lungs bilaterally. 3. Cardiomegaly with marked dilatation of the right atrium and right ventricle which appears to be chronic in this patient. 4. Atherosclerosis, including left main and 3 vessel coronary artery disease. Assessment for potential risk factor modification, dietary therapy or pharmacologic therapy may be warranted, if clinically indicated. 5. Additional findings, as above.   CT HEAD WITHOUT CONTRAST    TECHNIQUE: Contiguous axial images were obtained from the base of the skull through the vertex without intravenous contrast.   COMPARISON:  CT HEAD W/O CM dated 04/25/2010   FINDINGS: Moderate ventriculomegaly, likely on the basis of global  parenchymal brain volume loss as there is overall commensurate enlargement of cerebral sulci and cerebellar folia. No intraparenchymal hemorrhage, mass effect nor midline shift. Confluent supratentorial white matter hypodensities are within normal range for patient's age and though non-specific suggest sequelae of chronic small vessel ischemic disease. No acute large vascular territory infarcts.   No abnormal extra-axial fluid collections. 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 Basal cisterns are patent. Moderate calcific atherosclerosis of the carotid siphons.   No skull fracture. The included ocular globes and orbital contents are non-suspicious. Small amount of air within the cavernous sinuses suggests recent intravenous access. Status post bilateral ocular lens implants. The mastoid aircells and included paranasal sinuses are well-aerated. Severe temporomandibular osteoarthrosis.   IMPRESSION: No acute intracranial process.   Moderate global brain atrophy, increased from prior examination with moderate to severe white matter changes, progressed.    ASSESSMENT/PLAN:  UTI-continue Keflex. Recheck UA culture and sensitivity due to new urinary frequency. PE-continue xarelto. Hypertension-well controlled Right knee osteoarthritis-status post arthroplasty. Continue rehabilitation. Constipation-well controlled PSVT-continue metoprolol Check CBC and BMP  I have reviewed patient's medical records received at admission/from hospitalization.  CPT CODE: 44034  Qiara Minetti Y Willies Laviolette, [place] [phone]

## 2014-03-27 ENCOUNTER — Encounter: Payer: Self-pay | Admitting: *Deleted

## 2014-03-30 ENCOUNTER — Other Ambulatory Visit (HOSPITAL_COMMUNITY): Payer: Medicare Other

## 2014-04-03 ENCOUNTER — Encounter: Payer: Self-pay | Admitting: Adult Health

## 2014-04-03 ENCOUNTER — Non-Acute Institutional Stay (SKILLED_NURSING_FACILITY): Payer: Medicare Other | Admitting: Adult Health

## 2014-04-03 DIAGNOSIS — I471 Supraventricular tachycardia: Secondary | ICD-10-CM

## 2014-04-03 DIAGNOSIS — Z96651 Presence of right artificial knee joint: Secondary | ICD-10-CM

## 2014-04-03 DIAGNOSIS — I2699 Other pulmonary embolism without acute cor pulmonale: Secondary | ICD-10-CM

## 2014-04-03 DIAGNOSIS — Z96659 Presence of unspecified artificial knee joint: Secondary | ICD-10-CM

## 2014-04-03 DIAGNOSIS — I10 Essential (primary) hypertension: Secondary | ICD-10-CM

## 2014-04-03 DIAGNOSIS — D62 Acute posthemorrhagic anemia: Secondary | ICD-10-CM

## 2014-04-03 NOTE — Progress Notes (Signed)
Patient ID: [name], female   DOB: 1934/06/04, 80 y.o.   MRN: [id number]                 PROGRESS NOTE  DATE: 04/03/14  FACILITY: Nursing Home Location: [facility]  LEVEL OF CARE: SNF (31)  Acute Visit  CHIEF COMPLAINT:  Discharge Notes  HISTORY OF PRESENT ILLNESS: This is a 80 year old female who is for discharge home with Home health PT, OT and Nursing. She has been admitted to [facility] on 03/16/14 from [facility] with Acute encephalopathy most likely de to UTI,  PSVT and a recent right total knee replacement. Patient was admitted to this facility for short-term rehabilitation after the patient's recent hospitalization.  Patient has completed SNF rehabilitation and therapy has cleared the patient for discharge.   REASSESSMENT OF ONGOING PROBLEM(S):  HTN: Pt 's HTN remains stable.  Denies CP, sob, DOE, pedal edema, headaches, dizziness or visual disturbances.  No complications from the medications currently being used.  Last BP : 105/59  ANEMIA: The anemia has been stable. The patient denies fatigue, melena or hematochezia. No complications from the medications currently being used. 5/15 hgb 12.7  PULMONARY EMBOLISM: The pulmonary embolism remains stable. Patient is currently on anticoagulation.  Patient denies chest pain or shortness of breath. No complications reported from the anti-coagulation currently being used.  PAST MEDICAL HISTORY : Reviewed.  No changes/see problem list  CURRENT MEDICATIONS: Reviewed per MAR/see medication list  REVIEW OF SYSTEMS:  GENERAL: no change in appetite, no fatigue, no weight changes, no fever, chills or weakness RESPIRATORY: no cough, SOB, DOE, wheezing, hemoptysis CARDIAC: no chest pain, edema or palpitations GI: no abdominal pain, diarrhea, constipation, heart burn, nausea or vomiting  PHYSICAL EXAMINATION  GENERAL: no acute distress NECK: supple, trachea midline, no neck masses, no thyroid tenderness,  no thyromegaly LYMPHATICS: no LAN in the neck, no supraclavicular LAN RESPIRATORY: breathing is even & unlabored, BS CTAB CARDIAC: irregularly irregular, no murmur,no extra heart sounds, no edema GI: abdomen soft, normal BS, no masses, no tenderness, no hepatomegaly, no splenomegaly EXTREMITIES: able to move all 4 extremities PSYCHIATRIC: the patient is alert & oriented to person, affect & behavior appropriate  LABS/RADIOLOGY: Labs reviewed: Basic Metabolic Panel:  Recent Labs  03/13/14 1956 03/14/14 0315 03/14/14 0519  NA 134* 134* 137  K 3.8 4.0 3.5*  CL 96 95* 96  CO2 26 26 28   GLUCOSE 124* 123* 107*  BUN 26* 25* 25*  CREATININE 0.72 0.69 0.70  CALCIUM 8.4 8.2* 8.5  MG  --  2.0  --    Liver Function Tests:  Recent Labs  03/14/14 0315  AST 25  ALT 17  ALKPHOS 66  BILITOT 0.7  PROT 6.5  ALBUMIN 2.6*   CBC:  Recent Labs  03/13/14 1956 03/14/14 0519 03/15/14 0500  WBC 7.8 8.0 7.5  NEUTROABS 6.5  --   --   HGB 12.3 12.2 12.7  HCT 36.6 36.9 38.5  MCV 93.6 94.4 93.9  PLT 256 273 265    Lipid Panel:  Recent Labs  03/14/14 0519  HDL 32*   Cardiac Enzymes:  Recent Labs  04/26/13 0440 04/26/13 1105  TROPONINI <0.30 <0.30    ASSESSMENT/PLAN:  PSVT - rate-controlled; continue Lopressor and verapamil UTI - Resolved Anemia - stable; discontinue Niferex Hypertension - well-controlled S/P right total knee replacement - for Home health PT, OT and Nursing Hx of PE - continue Xarelto   I have  filled out patient's discharge paperwork and written prescriptions.  Patient will receive home health PT, OT and Nursing.  Total discharge time: Less than 30 minutes Discharge time involved coordination of the discharge process with Education officer, museum, nursing staff and therapy department. Medical justification for home health services verified.   CPT CODE: 40768  Seth Bake - NP  [facility] [phone]

## 2014-04-05 ENCOUNTER — Encounter: Payer: Self-pay | Admitting: Family Medicine

## 2014-04-05 ENCOUNTER — Ambulatory Visit (INDEPENDENT_AMBULATORY_CARE_PROVIDER_SITE_OTHER): Payer: Medicare Other | Admitting: Family Medicine

## 2014-04-05 VITALS — BP 126/74 | HR 67 | Temp 98.1°F | Ht 66.0 in | Wt 195.8 lb

## 2014-04-05 DIAGNOSIS — N390 Urinary tract infection, site not specified: Secondary | ICD-10-CM

## 2014-04-05 DIAGNOSIS — R350 Frequency of micturition: Secondary | ICD-10-CM

## 2014-04-05 LAB — POCT URINALYSIS DIPSTICK
BILIRUBIN UA: NEGATIVE
GLUCOSE UA: NEGATIVE
Nitrite, UA: NEGATIVE
Spec Grav, UA: >=1.03
UROBILINOGEN UA: 0.2
pH, UA: 6

## 2014-04-05 MED ORDER — CIPROFLOXACIN HCL 250 MG PO TABS: 250.0000 mg | ORAL_TABLET | Freq: Two times a day (BID) | ORAL | Status: DC

## 2014-04-05 NOTE — Progress Notes (Signed)
Pre visit review using our clinic review tool, if applicable. No additional management support is needed unless otherwise documented below in the visit note. 

## 2014-04-05 NOTE — Progress Notes (Signed)
Subjective:    Patient ID: [name], female    DOB: 1934-06-03, 80 y.o.   MRN: [id number]  HPI Here for urinary symptoms Recent knee surgery   Feels some burning to urinate  Like she is not emptying all the way  Some frequently  Low grade fever  Feeling sluggish  No n/v Symptoms started around Sunday  ? Last abx-whatever they gave her at nursing home   Results for orders placed in visit on 04/05/14  POCT URINALYSIS DIPSTICK      Result Value Ref Range   Color, UA yellow     Clarity, UA cloudy     Glucose, UA neg.     Bilirubin, UA neg.     Ketones, UA trace     Spec Grav, UA >=1.030     Blood, UA moderate     pH, UA 6.0     Protein, UA Trace     Urobilinogen, UA 0.2     Nitrite, UA neg.     Leukocytes, UA small (1+)       Patient Active Problem List   Diagnosis Date Noted  . UTI (urinary tract infection) 04/05/2014  . Unspecified arthropathy, lower leg 03/21/2014  . Acute blood loss anemia 03/17/2014  . History of right knee joint replacement 03/15/2014  . Encephalopathy acute 03/14/2014  . UTI (lower urinary tract infection) 03/14/2014  . Acute encephalopathy 03/14/2014  . Preop cardiovascular exam 02/23/2014  . Viral gastroenteritis 12/06/2013  . Solitary pulmonary nodule 05/03/2013  . Hypoxia 04/26/2013  . Sternal fracture 04/26/2013  . Atypical chest pain 04/26/2013  . Encounter for Medicare annual wellness exam 01/26/2013  . Right ventricular failure 12/09/2012  . PE (pulmonary embolism) 11/24/2012  . DVT (deep venous thrombosis) 11/24/2012  . Pulmonary hypertension, moderate to severe 11/24/2012  . Painful total knee replacement, dislocating patella 01/12/2012  . PES PLANUS 08/15/2010  . URINARY FREQUENCY, CHRONIC 07/14/2008  . HYPERCHOLESTEROLEMIA, PURE 07/09/2007  . HYPERTENSION, ESSENTIAL NOS 07/09/2007  . PSVT 02/23/2007  . SCLERODERMA 02/23/2007  . DEGENERATIVE JOINT DISEASE 02/23/2007  . SPINAL STENOSIS, LUMBAR 02/23/2007  .  OSTEOPENIA 02/23/2007  . SPONDYLOLISTHESIS 02/23/2007   Past Medical History  Diagnosis Date  . Osteopenia   . Hyperlipidemia   . Hypertension   . PSVT (paroxysmal supraventricular tachycardia)   . Spinal stenosis   . Overactive bladder     urge incontinence  . Pulmonary emboli   . Pulmonary hypertension   . Right ventricular failure   . Arthritis     knee  . Anemia    Past Surgical History  Procedure Laterality Date  . Cataract extraction  2008  . Bladder suspension  1993  . Joint replacement  2000    left TKR  . Total knee revision  01/12/2012    Procedure: TOTAL KNEE REVISION;  Surgeon: Kerin Salen, MD;  Location: [facility];  Service: Orthopedics;  Laterality: Left;  DEPUY/ LCS , HAND SET  . Trigger finger release  01/12/2012    Procedure: RELEASE TRIGGER FINGER/A-1 PULLEY;  Surgeon: Kerin Salen, MD;  Location: [facility];  Service: Orthopedics;  Laterality: Right;  . Cholecystectomy    . Hernia repair    . Total knee arthroplasty  10/29/2012    at [facility]   History  Substance Use Topics  . Smoking status: Never Smoker   . Smokeless tobacco: Not on file  . Alcohol Use: No   Family History  Problem Relation Age of Onset  .  Cancer Mother     breast CA  . Heart disease Father     MI  . Diabetes Father   . Hypertension Father   . Cancer Sister     uterine CA   Allergies  Allergen Reactions  . Penicillins Hives  . Prednisone Other (See Comments)    Causes insomnia  . Sulfonamide Derivatives Hives  . Adhesive [Tape] Other (See Comments)    redness   Current Outpatient Prescriptions on File Prior to Visit  Medication Sig Dispense Refill  . acetaminophen (TYLENOL) 500 MG tablet Take 2 tablets every 6 hours as needed for pain      . alum & mag hydroxide-simeth (MAALOX/MYLANTA) 200-200-20 MG/5ML suspension Take 30 mLs by mouth every 4 (four) hours as needed for indigestion or heartburn.      Marland Kitchen ascorbic acid (VITAMIN C) 500 MG tablet Take 500 mg by mouth 3 (three) times  daily. take 1 tablet TID WC      . bisacodyl (DULCOLAX) 10 MG suppository Place 10 mg rectally once. Use daily as needed for constipation      . CALCIUM-VITAMIN D PO Take 1 tablet by mouth 3 (three) times daily.      . cephALEXin (KEFLEX) 500 MG capsule Take 1 capsule (500 mg total) by mouth 2 (two) times daily. X 4 DAYS  8 capsule  0  . diphenhydrAMINE (BENADRYL) 50 MG/ML injection Inject 25 mg into the vein every 6 (six) hours as needed for itching.      . iron polysaccharides (NIFEREX) 150 MG capsule Take 150 mg by mouth daily.      . metoprolol (LOPRESSOR) 50 MG tablet Take 50 mg by mouth daily. For HTN      . Multiple Vitamin (MULTIVITAMIN WITH MINERALS) TABS tablet Take 1 tablet by mouth daily.      . ondansetron (ZOFRAN) 4 MG tablet Take 1 tablet (4 mg total) by mouth every 6 (six) hours as needed for nausea.  30 tablet  0  . oxyCODONE (OXY IR/ROXICODONE) 5 MG immediate release tablet Take 1 tablet (5 mg total) by mouth every 6 (six) hours as needed for moderate pain or breakthrough pain.  30 tablet  0  . polyethylene glycol (MIRALAX / GLYCOLAX) packet Take 17 g by mouth daily.       . rivaroxaban (XARELTO) 20 MG TABS tablet Take 20 mg by mouth daily. For anticoagulant      . sennosides-docusate sodium (SENOKOT-S) 8.6-50 MG tablet Take 1 tablet by mouth daily. For constipation      . sodium phosphate (FLEET) enema Place 1 enema rectally once as needed.      . traMADol (ULTRAM) 50 MG tablet Take 1-2 tablets (50-100 mg total) by mouth every 6 (six) hours as needed for moderate pain or severe pain.  30 tablet  0  . verapamil (CALAN-SR) 120 MG CR tablet Take 1 tablet (120 mg total) by mouth at bedtime.  90 tablet  3   No current facility-administered medications on file prior to visit.    Review of Systems Review of Systems  Constitutional: Negative for fever, appetite change, fatigue and unexpected weight change.  Eyes: Negative for pain and visual disturbance.  Respiratory: Negative for  cough and shortness of breath.   Cardiovascular: Negative for cp or palpitations    Gastrointestinal: Negative for nausea, diarrhea and constipation.  Genitourinary: po sfor urgency and frequency. neg for n/v or flank pain/ pos for dysuria  Skin: Negative for pallor or  rash   Neurological: Negative for weakness, light-headedness, numbness and headaches.  Hematological: Negative for adenopathy. Does not bruise/bleed easily.  Psychiatric/Behavioral: Negative for dysphoric mood. The patient is not nervous/anxious.         Objective:   Physical Exam  Constitutional: She appears well-developed and well-nourished. No distress.  overwt and well app  HENT:  Head: Normocephalic and atraumatic.  Mouth/Throat: Oropharynx is clear and moist.  Eyes: Conjunctivae and EOM are normal. Pupils are equal, round, and reactive to light.  Neck: Normal range of motion. Neck supple.  Cardiovascular: Normal rate and regular rhythm.   Pulmonary/Chest: Effort normal and breath sounds normal.  Abdominal: Soft. Bowel sounds are normal. She exhibits no distension and no mass. There is tenderness. There is no rebound and no guarding.  Mild suprapubic tenderness No cva tenderness  Musculoskeletal:  Knee appears to be healing well post op  Lymphadenopathy:    She has no cervical adenopathy.  Neurological: She is alert. No cranial nerve deficit. She exhibits normal muscle tone. Coordination normal.  Skin: Skin is warm and dry. No rash noted. No pallor.  Psychiatric: She has a normal mood and affect.          Assessment & Plan:   Problem List Items Addressed This Visit     Genitourinary   UTI (urinary tract infection)     Cover with cipro Per pt -recent uti after knee repl- ? What she took or if cx  Will cx this  Enc fluids Update if not starting to improve in a week or if worsening      Relevant Orders      Urine culture     Other   URINARY FREQUENCY, CHRONIC - Primary   Relevant Orders       POCT urinalysis dipstick (Completed)

## 2014-04-05 NOTE — Assessment & Plan Note (Signed)
Cover with cipro Per pt -recent uti after knee repl- ? What she took or if cx  Will cx this  Enc fluids Update if not starting to improve in a week or if worsening

## 2014-04-05 NOTE — Patient Instructions (Signed)
Drink lots of water  Take cipro as directed for urine infection (uti) We will update with urine culture report when it returns  If worse let me know

## 2014-04-07 ENCOUNTER — Telehealth: Payer: Self-pay

## 2014-04-07 NOTE — Telephone Encounter (Signed)
Left detailed voicemail letting pt's daughter know Dr. Marliss Coots comments.   Also called pt and advise her of Dr. Marliss Coots comments too.

## 2014-04-07 NOTE — Telephone Encounter (Signed)
This is iron - to take for anemia from surgery  The equivalent otc is ferrous sulfate 150 mg -take one daily  However she may not need it anymore based on the last cbc I saw -she may need to ask her surgeon

## 2014-04-07 NOTE — Telephone Encounter (Signed)
[name] pts daughter said Niferex 150 mg taking one daily is too expensive and wants to know an OTC equivalent that pt could take.[name] thinks cardiologist may have started pt on Niferex. [name] request to call pt at [phone]

## 2014-04-08 LAB — URINE CULTURE

## 2014-04-13 ENCOUNTER — Telehealth: Payer: Self-pay

## 2014-04-13 NOTE — Telephone Encounter (Signed)
Please given the order.  Thanks.  

## 2014-04-13 NOTE — Telephone Encounter (Signed)
Junie Panning PT with [facility] left v/m requesting verbal orders for home health PT 4 x a week for 1 week and 3 x a week for 3 weeks for gait training, strengthening, ROM and balance.Please advise.

## 2014-04-13 NOTE — Telephone Encounter (Signed)
Left detailed msg on VM giving Erin verbal order for pt

## 2014-04-22 ENCOUNTER — Other Ambulatory Visit: Payer: Self-pay | Admitting: Internal Medicine

## 2014-05-03 ENCOUNTER — Ambulatory Visit (INDEPENDENT_AMBULATORY_CARE_PROVIDER_SITE_OTHER): Payer: Medicare Other | Admitting: Internal Medicine

## 2014-05-03 ENCOUNTER — Encounter: Payer: Self-pay | Admitting: Internal Medicine

## 2014-05-03 VITALS — BP 132/79 | HR 56 | Ht 66.0 in | Wt 198.4 lb

## 2014-05-03 DIAGNOSIS — I2699 Other pulmonary embolism without acute cor pulmonale: Secondary | ICD-10-CM

## 2014-05-03 DIAGNOSIS — I272 Pulmonary hypertension, unspecified: Secondary | ICD-10-CM

## 2014-05-03 DIAGNOSIS — I498 Other specified cardiac arrhythmias: Secondary | ICD-10-CM

## 2014-05-03 DIAGNOSIS — R400 Somnolence: Secondary | ICD-10-CM

## 2014-05-03 DIAGNOSIS — G4710 Hypersomnia, unspecified: Secondary | ICD-10-CM

## 2014-05-03 DIAGNOSIS — I471 Supraventricular tachycardia: Secondary | ICD-10-CM

## 2014-05-03 DIAGNOSIS — I50810 Right heart failure, unspecified: Secondary | ICD-10-CM

## 2014-05-03 DIAGNOSIS — I2789 Other specified pulmonary heart diseases: Secondary | ICD-10-CM

## 2014-05-03 DIAGNOSIS — R5381 Other malaise: Secondary | ICD-10-CM

## 2014-05-03 DIAGNOSIS — I509 Heart failure, unspecified: Secondary | ICD-10-CM

## 2014-05-03 DIAGNOSIS — I10 Essential (primary) hypertension: Secondary | ICD-10-CM

## 2014-05-03 DIAGNOSIS — I4891 Unspecified atrial fibrillation: Secondary | ICD-10-CM

## 2014-05-03 DIAGNOSIS — R5383 Other fatigue: Secondary | ICD-10-CM

## 2014-05-03 NOTE — Patient Instructions (Signed)
Your physician has recommended that you have a pulmonary function test. Pulmonary Function Tests are a group of tests that measure how well air moves in and out of your lungs.  Your physician recommends that you have a VQ Scan.  (Nuclear perfusion study)Ventilation-Perfusion Scan A ventilation-perfusion scan is a scan to look at the airflow (ventilation) and blood flow (perfusion) in your lungs. It is most often used to look for blood clots that may have traveled to your lungs. During this scan, radioactive compounds are injected into your body or are breathed in (inhale). These radioactive compounds are detected by a special camera during the scan, are given at very low doses, are not harmful to you, and last in your body for a very short time.  LET [facility] CARE PROVIDER KNOW ABOUT:  Any allergies you have.  All medicines you are taking, including vitamins, herbs, eye drops, creams, and over-the-counter medicines.  Any blood disorders you have.  Previous surgeries you have had.  Medical conditions you have.  Possibility of pregnancy, if this applies.  Breastfeeding, if this applies. RISKS AND COMPLICATIONS Generally, this is a safe procedure. However, as with any procedure, complications can occur. A possible complication includes having an allergic reaction to the radioactive compounds.  BEFORE THE PROCEDURE  Do not smoke before your test.  Take medicine as directed by your health care provider. PROCEDURE  A small needle will be placed in a vein in your arm or hand. This needle will stay in place for the entire exam.  A small amount of very short-acting radioactive material will be injected.  Your lungs will then be scanned using a special camera. This camera will record the images.  You will be asked to inhale a second radioactive compound. After this, the lungs are scanned again. AFTER THE PROCEDURE  You may go home unless your health care provider instructs you  differently.  You may continue with normal activities and diet as instructed by your health care provider. Document Released: 10/10/2000 Document Revised: 08/03/2013 Document Reviewed: 04/28/2013 [facility] Patient Information 2015 [place], [place]. This information is not intended to replace advice given to you by your health care provider. Make sure you discuss any questions you have with your health care provider.   Your physician has recommended that you have a sleep study. This test records several body functions during sleep, including: brain activity, eye movement, oxygen and carbon dioxide blood levels, heart rate and rhythm, breathing rate and rhythm, the flow of air through your mouth and nose, snoring, body muscle movements, and chest and belly movement.  Your physician recommends that you schedule a follow-up appointment after the above testing is done with Dr. Haroldine Laws at the [facility] heart failure clinic.  Your physician recommends that you schedule a follow-up appointment in: 3 months with Dr. Rayann Heman.

## 2014-05-03 NOTE — Progress Notes (Signed)
PCP:  Loura Pardon, MD  The patient presents today for cardiology followup. Her main complaint today is of fatigue. She did have a total right knee replacement less than 8 weeks ago which may be contributing. The daughter  Says she falls asleep easily during the day. She has also had 2 short spells of tachycardia less than 30 minutes each. The last spell she actually woke up with in the middle of the night. It did resolve on its own. She continues on Xarelto for history of DVT PE. Her last echocardiogram was just a few months ago which showed normal LV function but with severely increased systolic PA pressures of 73.  If the patient has sleep apnea this could be contributing as well as her history of PE. For further workup of elevated pulmonary pressures, PFTs, as well as a sleep study to assess for sleep apnea and a VQ scan to assess for any recurrence of PE was suggested. Patient is in agreement with this. Now she will continue her same rate control medications without change She is not having any issues with bleeding with t xarelto.   Past Medical History  Diagnosis Date  . Osteopenia   . Hyperlipidemia   . Hypertension   . PSVT (paroxysmal supraventricular tachycardia)   . Spinal stenosis   . Overactive bladder     urge incontinence  . Pulmonary emboli   . Pulmonary hypertension   . Right ventricular failure   . Arthritis     knee  . Anemia    Past Surgical History  Procedure Laterality Date  . Cataract extraction  2008  . Bladder suspension  1993  . Joint replacement  2000    left TKR  . Total knee revision  01/12/2012    Procedure: TOTAL KNEE REVISION;  Surgeon: Kerin Salen, MD;  Location: [place] [facility];  Service: Orthopedics;  Laterality: Left;  DEPUY/ LCS , HAND SET  . Trigger finger release  01/12/2012    Procedure: RELEASE TRIGGER FINGER/A-1 PULLEY;  Surgeon: Kerin Salen, MD;  Location: [facility];  Service: Orthopedics;  Laterality: Right;  . Cholecystectomy    . Hernia repair    .  Total knee arthroplasty  10/29/2012    at [facility]    Current Outpatient Prescriptions  Medication Sig Dispense Refill  . acetaminophen (TYLENOL) 500 MG tablet Take 2 tablets every 6 hours as needed for pain      . alum & mag hydroxide-simeth (MAALOX/MYLANTA) 200-200-20 MG/5ML suspension Take 30 mLs by mouth every 4 (four) hours as needed for indigestion or heartburn.      Marland Kitchen ascorbic acid (VITAMIN C) 500 MG tablet Take 500 mg by mouth 3 (three) times daily.       . bisacodyl (DULCOLAX) 10 MG suppository Place 10 mg rectally once. Use daily as needed for constipation      . CALCIUM-VITAMIN D PO Take 1 tablet by mouth 3 (three) times daily.      . metoprolol (LOPRESSOR) 50 MG tablet Take 50 mg by mouth daily. For HTN      . Multiple Vitamin (MULTIVITAMIN WITH MINERALS) TABS tablet Take 1 tablet by mouth daily.      . ondansetron (ZOFRAN) 4 MG tablet Take 1 tablet (4 mg total) by mouth every 6 (six) hours as needed for nausea.  30 tablet  0  . polyethylene glycol (MIRALAX / GLYCOLAX) packet Take 17 g by mouth daily.       . rivaroxaban (XARELTO) 20 MG  TABS tablet Take 20 mg by mouth daily. For anticoagulant      . traMADol (ULTRAM) 50 MG tablet Take 50 mg by mouth every 6 (six) hours as needed.      . verapamil (CALAN-SR) 120 MG CR tablet Take 1 tablet (120 mg total) by mouth at bedtime.  90 tablet  3   No current facility-administered medications for this visit.    Allergies  Allergen Reactions  . Penicillins Hives  . Sulfonamide Derivatives Hives  . Adhesive [Tape] Other (See Comments)    redness  . Prednisone Other (See Comments)    Causes insomnia    History   Social History  . Marital Status: Married    Spouse Name: N/A    Number of Children: N/A  . Years of Education: N/A   Occupational History  . Retired    Social History Main Topics  . Smoking status: Never Smoker   . Smokeless tobacco: Not on file  . Alcohol Use: No  . Drug Use: No  . Sexual Activity: No   Other  Topics Concern  . Not on file   Social History Narrative   Pt lives alone but dtr lives on property and has been staying with her since surgery.    Family History  Problem Relation Age of Onset  . Cancer Mother     breast CA  . Heart disease Father     MI  . Diabetes Father   . Hypertension Father   . Cancer Sister     uterine CA    Physical Exam: Filed Vitals:   05/03/14 0959  BP: 132/79  Pulse: 56  Height: 5\' 6"  (1.676 m)  Weight: 89.994 kg (198 lb 6.4 oz)    GEN- The patient is elderly appearing, alert and oriented x 3 today.   Head- normocephalic, atraumatic Eyes-  Sclera clear, conjunctiva pink Ears- hearing intact Oropharynx- clear Neck- supple, JVP 9cm Lungs- Clear to ausculation bilaterally, normal work of breathing Heart- Regular rate and rhythm, no murmurs, rubs or gallops, PMI not laterally displaced GI- soft, NT, ND, + BS Extremities- wnl healing right knee incision from recent TKR, old incision TKR on left, no edema. Neuro- strength and sensation are intact  EKG- today reveals sinus rhythm 56, nonspecific ST abnormality   Assessment and Plan:  1.H/O PTE/pulmonary hypertension Doing well with xarelto. Her pulmonary pressure remains severely elevated likely due to prior PTE.  I spoke with Dr Haroldine Laws this am who recommends additional workup. Further assessment of elevated pulmonary pressures will include sleep study to assess for sleep apnea (with h/o daytime somnolence), PFTs to exclude intrinsic lung disease, and VQ scan to look for further evidence of recurrent PE. I will refer her to see Dr. Haroldine Laws for further pulmonary hypertension assessment after tests.  If he feels that no further testing or management are required then I will resume her long term care.  If he feels that additional management is needed then I will defer to him.  2. SVT Well controlled with beta blockers/verapamil, very few nonsustained episodes.  She does not wish to  consider ablation at this time.    3. Fatigue May likely be post operative from recent knee replacement. Has many symptoms of sleep apnea may which be contributing. Sleep study scheduled   Return to see me in 3 months.  If she has further SVT,  then she should call in the interim.

## 2014-05-09 ENCOUNTER — Ambulatory Visit (HOSPITAL_COMMUNITY): Admission: RE | Admit: 2014-05-09 | Payer: Medicare Other | Source: Ambulatory Visit

## 2014-05-25 ENCOUNTER — Ambulatory Visit (INDEPENDENT_AMBULATORY_CARE_PROVIDER_SITE_OTHER): Payer: Medicare Other | Admitting: Internal Medicine

## 2014-05-25 DIAGNOSIS — R400 Somnolence: Secondary | ICD-10-CM

## 2014-05-25 DIAGNOSIS — I471 Supraventricular tachycardia: Secondary | ICD-10-CM

## 2014-05-25 DIAGNOSIS — I498 Other specified cardiac arrhythmias: Secondary | ICD-10-CM

## 2014-05-25 DIAGNOSIS — R5383 Other fatigue: Secondary | ICD-10-CM

## 2014-05-25 LAB — PULMONARY FUNCTION TEST
DL/VA % pred: 97 %
DL/VA: 4.83 ml/min/mmHg/L
DLCO unc % pred: 68 %
DLCO unc: 17.91 ml/min/mmHg
FEF 25-75 PRE: 2.54 L/s
FEF 25-75 Post: 2.61 L/sec
FEF2575-%Change-Post: 2 %
FEF2575-%PRED-POST: 166 %
FEF2575-%Pred-Pre: 161 %
FEV1-%CHANGE-POST: 2 %
FEV1-%PRED-POST: 90 %
FEV1-%PRED-PRE: 88 %
FEV1-PRE: 1.88 L
FEV1-Post: 1.93 L
FEV1FVC-%Change-Post: 0 %
FEV1FVC-%PRED-PRE: 114 %
FEV6-%Change-Post: 3 %
FEV6-%Pred-Post: 83 %
FEV6-%Pred-Pre: 80 %
FEV6-PRE: 2.18 L
FEV6-Post: 2.26 L
FEV6FVC-%Change-Post: 0 %
FEV6FVC-%PRED-PRE: 104 %
FEV6FVC-%Pred-Post: 105 %
FVC-%Change-Post: 1 %
FVC-%Pred-Post: 79 %
FVC-%Pred-Pre: 77 %
FVC-Post: 2.26 L
FVC-Pre: 2.22 L
Post FEV1/FVC ratio: 85 %
Post FEV6/FVC ratio: 100 %
Pre FEV1/FVC ratio: 85 %
Pre FEV6/FVC Ratio: 99 %
RV % PRED: 75 %
RV: 1.84 L
TLC % pred: 74 %
TLC: 3.92 L

## 2014-05-25 NOTE — Progress Notes (Signed)
PFT done today. 

## 2014-05-30 ENCOUNTER — Ambulatory Visit (HOSPITAL_COMMUNITY)
Admission: RE | Admit: 2014-05-30 | Discharge: 2014-05-30 | Disposition: A | Discharge status: Home / Self Care | Payer: Medicare Other | Source: Ambulatory Visit | Attending: Internal Medicine | Admitting: Internal Medicine

## 2014-05-30 DIAGNOSIS — R400 Somnolence: Secondary | ICD-10-CM

## 2014-05-30 DIAGNOSIS — Z96659 Presence of unspecified artificial knee joint: Secondary | ICD-10-CM | POA: Diagnosis not present

## 2014-05-30 DIAGNOSIS — I471 Supraventricular tachycardia: Secondary | ICD-10-CM

## 2014-05-30 DIAGNOSIS — G4710 Hypersomnia, unspecified: Secondary | ICD-10-CM | POA: Insufficient documentation

## 2014-05-30 DIAGNOSIS — Z86711 Personal history of pulmonary embolism: Secondary | ICD-10-CM | POA: Insufficient documentation

## 2014-05-30 DIAGNOSIS — R5383 Other fatigue: Secondary | ICD-10-CM

## 2014-05-30 DIAGNOSIS — Z86718 Personal history of other venous thrombosis and embolism: Secondary | ICD-10-CM | POA: Insufficient documentation

## 2014-05-30 DIAGNOSIS — Z09 Encounter for follow-up examination after completed treatment for conditions other than malignant neoplasm: Secondary | ICD-10-CM | POA: Insufficient documentation

## 2014-05-30 DIAGNOSIS — I498 Other specified cardiac arrhythmias: Secondary | ICD-10-CM | POA: Insufficient documentation

## 2014-05-30 DIAGNOSIS — R5381 Other malaise: Secondary | ICD-10-CM | POA: Insufficient documentation

## 2014-05-30 MED ORDER — TECHNETIUM TC 99M DIETHYLENETRIAME-PENTAACETIC ACID: 40.0000 | Freq: Once | INTRAVENOUS | Status: AC | PRN

## 2014-05-30 MED ORDER — TECHNETIUM TO 99M ALBUMIN AGGREGATED
6.0000 | Freq: Once | INTRAVENOUS | Status: AC | PRN
  Administered 2014-05-30: 6 via INTRAVENOUS

## 2014-06-02 ENCOUNTER — Other Ambulatory Visit: Payer: Self-pay | Admitting: *Deleted

## 2014-06-02 DIAGNOSIS — R942 Abnormal results of pulmonary function studies: Secondary | ICD-10-CM

## 2014-06-05 ENCOUNTER — Other Ambulatory Visit: Payer: Self-pay | Admitting: Family Medicine

## 2014-06-05 ENCOUNTER — Telehealth: Payer: Self-pay | Admitting: Family Medicine

## 2014-06-05 DIAGNOSIS — Z1231 Encounter for screening mammogram for malignant neoplasm of breast: Secondary | ICD-10-CM

## 2014-06-05 NOTE — Telephone Encounter (Signed)
[name] @ [employer] called wanting to make [name] a cpx medicare wellness appointment this year.  Dr tower is out till Jan 2016 for cpx.  Can I put her in a sooner time slot or make and appointment with [facility]

## 2014-06-05 NOTE — Telephone Encounter (Signed)
A sooner time slot is fine with me

## 2014-06-06 NOTE — Telephone Encounter (Signed)
Left message for pt to call office please schedule appointment

## 2014-06-06 NOTE — Telephone Encounter (Signed)
Appointment 07/14/14 pt aware of appointment

## 2014-06-12 ENCOUNTER — Other Ambulatory Visit: Payer: Self-pay

## 2014-06-12 MED ORDER — METOPROLOL TARTRATE 50 MG PO TABS: 50.0000 mg | ORAL_TABLET | Freq: Every day | ORAL | Status: DC

## 2014-06-26 ENCOUNTER — Encounter (HOSPITAL_COMMUNITY): Payer: Self-pay | Admitting: *Deleted

## 2014-06-26 ENCOUNTER — Ambulatory Visit (HOSPITAL_COMMUNITY)
Admission: RE | Admit: 2014-06-26 | Discharge: 2014-06-26 | Disposition: A | Discharge status: Home / Self Care | Payer: Medicare Other | Source: Ambulatory Visit | Attending: Internal Medicine | Admitting: Internal Medicine

## 2014-06-26 ENCOUNTER — Other Ambulatory Visit: Payer: Self-pay | Admitting: *Deleted

## 2014-06-26 ENCOUNTER — Encounter (HOSPITAL_COMMUNITY): Payer: Self-pay

## 2014-06-26 VITALS — BP 104/62 | HR 56 | Wt 198.8 lb

## 2014-06-26 DIAGNOSIS — J9611 Chronic respiratory failure with hypoxia: Secondary | ICD-10-CM

## 2014-06-26 DIAGNOSIS — I2789 Other specified pulmonary heart diseases: Secondary | ICD-10-CM | POA: Insufficient documentation

## 2014-06-26 DIAGNOSIS — I272 Pulmonary hypertension, unspecified: Secondary | ICD-10-CM

## 2014-06-26 DIAGNOSIS — Z86718 Personal history of other venous thrombosis and embolism: Secondary | ICD-10-CM | POA: Diagnosis not present

## 2014-06-26 DIAGNOSIS — E669 Obesity, unspecified: Secondary | ICD-10-CM | POA: Insufficient documentation

## 2014-06-26 DIAGNOSIS — I471 Supraventricular tachycardia, unspecified: Secondary | ICD-10-CM | POA: Insufficient documentation

## 2014-06-26 DIAGNOSIS — Z9981 Dependence on supplemental oxygen: Secondary | ICD-10-CM | POA: Insufficient documentation

## 2014-06-26 DIAGNOSIS — R0902 Hypoxemia: Secondary | ICD-10-CM | POA: Insufficient documentation

## 2014-06-26 DIAGNOSIS — Z96659 Presence of unspecified artificial knee joint: Secondary | ICD-10-CM | POA: Insufficient documentation

## 2014-06-26 DIAGNOSIS — J9610 Chronic respiratory failure, unspecified whether with hypoxia or hypercapnia: Secondary | ICD-10-CM | POA: Diagnosis not present

## 2014-06-26 DIAGNOSIS — E785 Hyperlipidemia, unspecified: Secondary | ICD-10-CM | POA: Insufficient documentation

## 2014-06-26 DIAGNOSIS — I10 Essential (primary) hypertension: Secondary | ICD-10-CM | POA: Insufficient documentation

## 2014-06-26 NOTE — Progress Notes (Signed)
SATURATION QUALIFICATIONS: (This note is used to comply with regulatory documentation for home oxygen)  Patient Saturations on Room Air at Rest = 96%  Patient Saturations on Room Air while Ambulating = 84%  Patient Saturations on 2 Liters of oxygen while Ambulating = 98%  Please briefly explain why patient needs home oxygen: hypoxemia, pulmonary hyptertension

## 2014-06-26 NOTE — Progress Notes (Signed)
Patient ID: [name], female   DOB: 1934-09-12, 80 y.o.   MRN: [id number]  PCP:  Loura Pardon, MD EP: Dr Rayann Heman   HPI: Ms Cregan is a 80 y/o woman referred by Dr. Rayann Heman for further evaluation of pulmonary HTN.   She has a h/o obesity, HTN, PSVT.  She had a total revision/replacement of her L knee replacement by Dr Al Corpus a couple of years ago and this was complicated by a DVT and PE. Then underwent R TKR in 5/15 without problem. She remains on Xarelto.   She continues to c/o exertional dyspnea and fatigue. Says main limitation is weakness in her quadriceps. Goes to PT 2x/week. Gets around with a cane when she is out. No edema, orthopnea, PND or presyncope/syncope. Tired all the time. No energy. Dr. Rayann Heman ordered sleep study for 07/09/14. Daughter says she snores occasionally.   Denies smoking or asthma. Used to work for [employer]. No Fhx of PAH. TSH normal. Says she has h/o scleroderma. However she said it was treated in the past and arrested. Has not been treated for 40 years. No scleradactyly.    Had Echo 03/10/14: LVEF 55-60% with flat septum. RV moderately dilated. Moderate to severe TR. Mild HK RVSP 53mmHG  PFTs (05/25/14)   FEV1  1.88 L (88%) FVC    2.22 L (77%) FEV1/F VC 114% DLCO 68%    CT chest 5/15: no PE. No ILD. + RV strain and coronary calcifications . Esophagus normal VQ 05/30/14: Low prob   HGB 12.7    Past Medical History  Diagnosis Date  . Osteopenia   . Hyperlipidemia   . Hypertension   . PSVT (paroxysmal supraventricular tachycardia)   . Spinal stenosis   . Overactive bladder     urge incontinence  . Pulmonary emboli   . Pulmonary hypertension   . Right ventricular failure   . Arthritis     knee  . Anemia    Past Surgical History  Procedure Laterality Date  . Cataract extraction  2008  . Bladder suspension  1993  . Joint replacement  2000    left TKR  . Total knee revision  01/12/2012    Procedure: TOTAL KNEE REVISION;  Surgeon: Kerin Salen, MD;  Location: [facility];  Service: Orthopedics;  Laterality: Left;  DEPUY/ LCS , HAND SET  . Trigger finger release  01/12/2012    Procedure: RELEASE TRIGGER FINGER/A-1 PULLEY;  Surgeon: Kerin Salen, MD;  Location: [facility];  Service: Orthopedics;  Laterality: Right;  . Cholecystectomy    . Hernia repair    . Total knee arthroplasty  10/29/2012    at [facility]    Current Outpatient Prescriptions  Medication Sig Dispense Refill  . acetaminophen (TYLENOL) 500 MG tablet Take 2 tablets every 6 hours as needed for pain      . alum & mag hydroxide-simeth (MAALOX/MYLANTA) 200-200-20 MG/5ML suspension Take 30 mLs by mouth every 4 (four) hours as needed for indigestion or heartburn.      Marland Kitchen ascorbic acid (VITAMIN C) 500 MG tablet Take 500 mg by mouth 3 (three) times daily.       . bisacodyl (DULCOLAX) 10 MG suppository Place 10 mg rectally once. Use daily as needed for constipation      . CALCIUM-VITAMIN D PO Take 1 tablet by mouth 3 (three) times daily.      . metoprolol (LOPRESSOR) 50 MG tablet Take 1 tablet (50 mg total) by mouth daily. For HTN  90 tablet  3  . Multiple Vitamin (MULTIVITAMIN WITH MINERALS) TABS tablet Take 1 tablet by mouth daily.      . ondansetron (ZOFRAN) 4 MG tablet Take 1 tablet (4 mg total) by mouth every 6 (six) hours as needed for nausea.  30 tablet  0  . polyethylene glycol (MIRALAX / GLYCOLAX) packet Take 17 g by mouth daily.       . rivaroxaban (XARELTO) 20 MG TABS tablet Take 20 mg by mouth daily. For anticoagulant      . traMADol (ULTRAM) 50 MG tablet Take 50 mg by mouth every 6 (six) hours as needed.      . verapamil (CALAN-SR) 120 MG CR tablet Take 1 tablet (120 mg total) by mouth at bedtime.  90 tablet  3   No current facility-administered medications for this encounter.    Allergies  Allergen Reactions  . Penicillins Hives  . Sulfonamide Derivatives Hives  . Adhesive [Tape] Other (See Comments)    redness  . Prednisone Other (See Comments)    Causes insomnia     History   Social History  . Marital Status: Married    Spouse Name: N/A    Number of Children: N/A  . Years of Education: N/A   Occupational History  . Retired    Social History Main Topics  . Smoking status: Never Smoker   . Smokeless tobacco: Not on file  . Alcohol Use: No  . Drug Use: No  . Sexual Activity: No   Other Topics Concern  . Not on file   Social History Narrative   Pt lives alone but dtr lives on property and has been staying with her since surgery.    Family History  Problem Relation Age of Onset  . Cancer Mother     breast CA  . Heart disease Father     MI  . Diabetes Father   . Hypertension Father   . Cancer Sister     uterine CA    Physical Exam: Filed Vitals:   06/26/14 1139  BP: 104/62  Pulse: 56  Weight: 198 lb 12.8 oz (90.175 kg)  SpO2: 96%   Hall walk with sats down to 85% on RA  GEN- The patient is elderly appearing, alert and oriented x 3 today.   Head- normocephalic, atraumatic Eyes-  Sclera clear, conjunctiva pink Ears- hearing intact Oropharynx- clear Neck- supple, JVP 8cm Lungs- Clear to ausculation bilaterally, normal work of breathing Heart- Regular rate and rhythm, 2/6 TR loud P2, rubs or gallops, PMI not laterally displaced GI- soft, NT, ND, + BS Extremities- bilateral TKR on left, no edema. Neuro- strength and sensation are intact   Assessment and Plan:  1. Pulmonary HTN 2. Chronic respiratory failure with hypoxia 3. H/o scleroderma but no physical findings to support 4. Remote PE with normal f/u CT and VQ scan  She has evidence of severe PAH without clear attributable cause. I suspect she may have undiagnosed OSA as a component. She desaturates with hall walk.   Will start home O2. Check ANA, anti-Sc70, ANCA, HIV, Hepatitis panels and sleep study. Will arrange for RHC.   See back in several weeks to evaluate results.   Total time spent 45 minutes. Over half that time spent discussing above.   Catrice Zuleta,MD 2:17 PM

## 2014-06-26 NOTE — Patient Instructions (Signed)
Labs today  We have ordered you home oxygen, [facility] will call you to set this up  Right Heart Catheterization is scheduled for Tuesday 07/04/14, see instruction sheet  Your physician recommends that you schedule a follow-up appointment in: 3-4 weeks

## 2014-06-27 DIAGNOSIS — J9610 Chronic respiratory failure, unspecified whether with hypoxia or hypercapnia: Secondary | ICD-10-CM | POA: Insufficient documentation

## 2014-06-27 LAB — HIV ANTIBODY (ROUTINE TESTING W REFLEX): HIV: NONREACTIVE

## 2014-06-27 LAB — HEPATITIS PANEL, ACUTE
HCV AB: NEGATIVE
Hep A IgM: NONREACTIVE
Hep B C IgM: NONREACTIVE
Hepatitis B Surface Ag: NEGATIVE

## 2014-06-27 LAB — ANA: ANA: POSITIVE — AB

## 2014-06-27 LAB — ANTI-NUCLEAR AB-TITER (ANA TITER): ANA Titer 1: NEGATIVE

## 2014-06-27 LAB — ANTI-SCLERODERMA ANTIBODY: Scleroderma (Scl-70) (ENA) Antibody, IgG: <1

## 2014-06-30 ENCOUNTER — Encounter (HOSPITAL_COMMUNITY): Payer: Self-pay | Admitting: Pharmacy Technician

## 2014-07-04 ENCOUNTER — Ambulatory Visit (HOSPITAL_COMMUNITY)
Admission: RE | Admit: 2014-07-04 | Discharge: 2014-07-04 | Disposition: A | Discharge status: Home / Self Care | Payer: Medicare Other | Source: Ambulatory Visit | Attending: Internal Medicine | Admitting: Internal Medicine

## 2014-07-04 ENCOUNTER — Encounter (HOSPITAL_COMMUNITY)
Admission: RE | Disposition: A | Discharge status: Home / Self Care | Payer: Self-pay | Source: Ambulatory Visit | Attending: Internal Medicine

## 2014-07-04 DIAGNOSIS — E785 Hyperlipidemia, unspecified: Secondary | ICD-10-CM | POA: Diagnosis not present

## 2014-07-04 DIAGNOSIS — E669 Obesity, unspecified: Secondary | ICD-10-CM | POA: Insufficient documentation

## 2014-07-04 DIAGNOSIS — M899 Disorder of bone, unspecified: Secondary | ICD-10-CM | POA: Diagnosis not present

## 2014-07-04 DIAGNOSIS — I279 Pulmonary heart disease, unspecified: Secondary | ICD-10-CM

## 2014-07-04 DIAGNOSIS — Z86711 Personal history of pulmonary embolism: Secondary | ICD-10-CM | POA: Diagnosis not present

## 2014-07-04 DIAGNOSIS — I2789 Other specified pulmonary heart diseases: Secondary | ICD-10-CM | POA: Diagnosis present

## 2014-07-04 DIAGNOSIS — I10 Essential (primary) hypertension: Secondary | ICD-10-CM | POA: Diagnosis not present

## 2014-07-04 DIAGNOSIS — I079 Rheumatic tricuspid valve disease, unspecified: Secondary | ICD-10-CM | POA: Diagnosis not present

## 2014-07-04 DIAGNOSIS — M949 Disorder of cartilage, unspecified: Secondary | ICD-10-CM

## 2014-07-04 DIAGNOSIS — Z86718 Personal history of other venous thrombosis and embolism: Secondary | ICD-10-CM | POA: Insufficient documentation

## 2014-07-04 DIAGNOSIS — Z7901 Long term (current) use of anticoagulants: Secondary | ICD-10-CM | POA: Insufficient documentation

## 2014-07-04 DIAGNOSIS — N3941 Urge incontinence: Secondary | ICD-10-CM | POA: Insufficient documentation

## 2014-07-04 DIAGNOSIS — Z96659 Presence of unspecified artificial knee joint: Secondary | ICD-10-CM | POA: Insufficient documentation

## 2014-07-04 DIAGNOSIS — I471 Supraventricular tachycardia, unspecified: Secondary | ICD-10-CM | POA: Insufficient documentation

## 2014-07-04 HISTORY — PX: RIGHT HEART CATHETERIZATION: SHX5447

## 2014-07-04 LAB — CBC
HCT: 41.8 % (ref 36.0–46.0)
Hemoglobin: 13.4 g/dL (ref 12.0–15.0)
MCH: 29.6 pg (ref 26.0–34.0)
MCHC: 32.1 g/dL (ref 30.0–36.0)
MCV: 92.5 fL (ref 78.0–100.0)
Platelets: 269 10*3/uL (ref 150–400)
RBC: 4.52 MIL/uL (ref 3.87–5.11)
RDW: 13.4 % (ref 11.5–15.5)
WBC: 6.7 10*3/uL (ref 4.0–10.5)

## 2014-07-04 LAB — BASIC METABOLIC PANEL
ANION GAP: 8 (ref 5–15)
BUN: 19 mg/dL (ref 6–23)
CO2: 33 mEq/L — ABNORMAL HIGH (ref 19–32)
Calcium: 9 mg/dL (ref 8.4–10.5)
Chloride: 100 mEq/L (ref 96–112)
Creatinine, Ser: 0.77 mg/dL (ref 0.50–1.10)
GFR calc non Af Amer: 78 mL/min — ABNORMAL LOW (ref >90)
Glucose, Bld: 101 mg/dL — ABNORMAL HIGH (ref 70–99)
Potassium: 4.2 mEq/L (ref 3.7–5.3)
Sodium: 141 mEq/L (ref 137–147)

## 2014-07-04 LAB — PROTIME-INR
INR: 1.24 (ref 0.00–1.49)
PROTHROMBIN TIME: 15.6 s — AB (ref 11.6–15.2)

## 2014-07-04 LAB — POCT I-STAT 3, VENOUS BLOOD GAS (G3P V)
ACID-BASE EXCESS: 6 mmol/L — AB (ref 0.0–2.0)
Acid-Base Excess: 5 mmol/L — ABNORMAL HIGH (ref 0.0–2.0)
BICARBONATE: 32.2 meq/L — AB (ref 20.0–24.0)
BICARBONATE: 33.1 meq/L — AB (ref 20.0–24.0)
O2 Saturation: 72 %
O2 Saturation: 73 %
PCO2 VEN: 56.3 mmHg — AB (ref 45.0–50.0)
PH VEN: 7.365 — AB (ref 7.250–7.300)
PO2 VEN: 40 mmHg (ref 30.0–45.0)
PO2 VEN: 40 mmHg (ref 30.0–45.0)
TCO2: 34 mmol/L (ref 0–100)
TCO2: 35 mmol/L (ref 0–100)
pCO2, Ven: 56.8 mmHg — ABNORMAL HIGH (ref 45.0–50.0)
pH, Ven: 7.373 — ABNORMAL HIGH (ref 7.250–7.300)

## 2014-07-04 SURGERY — RIGHT HEART CATH
Anesthesia: LOCAL

## 2014-07-04 MED ORDER — HEPARIN SODIUM (PORCINE) 1000 UNIT/ML IJ SOLN
INTRAMUSCULAR | Status: AC
  Filled 2014-07-04: qty 1

## 2014-07-04 MED ORDER — ASPIRIN 81 MG PO CHEW
CHEWABLE_TABLET | ORAL | Status: AC
  Filled 2014-07-04: qty 1

## 2014-07-04 MED ORDER — SODIUM CHLORIDE 0.9 % IV SOLN
INTRAVENOUS | Status: DC
  Administered 2014-07-04: 08:00:00 via INTRAVENOUS

## 2014-07-04 MED ORDER — HEPARIN (PORCINE) IN NACL 2-0.9 UNIT/ML-% IJ SOLN
INTRAMUSCULAR | Status: AC
  Filled 2014-07-04: qty 1000

## 2014-07-04 MED ORDER — SODIUM CHLORIDE 0.9 % IJ SOLN: 3.0000 mL | INTRAMUSCULAR | Status: DC | PRN

## 2014-07-04 MED ORDER — SODIUM CHLORIDE 0.9 % IV SOLN: 250.0000 mL | INTRAVENOUS | Status: DC | PRN

## 2014-07-04 MED ORDER — MIDAZOLAM HCL 2 MG/2ML IJ SOLN
INTRAMUSCULAR | Status: AC
  Filled 2014-07-04: qty 2

## 2014-07-04 MED ORDER — LIDOCAINE HCL (PF) 1 % IJ SOLN
INTRAMUSCULAR | Status: AC
  Filled 2014-07-04: qty 30

## 2014-07-04 MED ORDER — SODIUM CHLORIDE 0.9 % IJ SOLN
3.0000 mL | Freq: Two times a day (BID) | INTRAMUSCULAR | Status: DC
  Administered 2014-07-04: 3 mL via INTRAVENOUS

## 2014-07-04 MED ORDER — HEPARIN (PORCINE) IN NACL 2-0.9 UNIT/ML-% IJ SOLN
INTRAMUSCULAR | Status: AC
  Filled 2014-07-04: qty 500

## 2014-07-04 MED ORDER — NITROGLYCERIN 1 MG/10 ML FOR IR/CATH LAB
INTRA_ARTERIAL | Status: AC
  Filled 2014-07-04: qty 10

## 2014-07-04 MED ORDER — ASPIRIN 81 MG PO CHEW
81.0000 mg | CHEWABLE_TABLET | ORAL | Status: AC
  Administered 2014-07-04: 81 mg via ORAL

## 2014-07-04 MED ORDER — SODIUM CHLORIDE 0.9 % IJ SOLN: 3.0000 mL | Freq: Two times a day (BID) | INTRAMUSCULAR | Status: DC

## 2014-07-04 MED ORDER — FENTANYL CITRATE 0.05 MG/ML IJ SOLN
INTRAMUSCULAR | Status: AC
  Filled 2014-07-04: qty 2

## 2014-07-04 NOTE — H&P (View-Only) (Signed)
Patient ID: [name], female   DOB: 19-Apr-1934, 80 y.o.   MRN: [id number]  PCP:  [name] Pardon, MD EP: Dr [name]   HPI: [name] is a 80 y/o woman referred by Dr. Rayann [name] for further evaluation of pulmonary HTN.   She has a h/o obesity, HTN, PSVT.  She had a total revision/replacement of her L knee replacement by Dr [name] a couple of years ago and this was complicated by a DVT and PE. Then underwent R TKR in 5/15 without problem. She remains on Xarelto.   She continues to c/o exertional dyspnea and fatigue. Says main limitation is weakness in her quadriceps. Goes to PT 2x/week. Gets around with a cane when she is out. No edema, orthopnea, PND or presyncope/syncope. Tired all the time. No energy. Dr. Rayann [name] ordered sleep study for 07/09/14. Daughter says she snores occasionally.   Denies smoking or asthma. Used to work for [employer]. No Fhx of PAH. TSH normal. Says she has h/o scleroderma. However she said it was treated in the past and arrested. Has not been treated for 40 years. No scleradactyly.    Had Echo 03/10/14: LVEF 55-60% with flat septum. RV moderately dilated. Moderate to severe TR. Mild HK RVSP 18mmHG  PFTs (05/25/14)   FEV1  1.88 L (88%) FVC    2.22 L (77%) FEV1/F VC 114% DLCO 68%    CT chest 5/15: no PE. No ILD. + RV strain and coronary calcifications . Esophagus normal VQ 05/30/14: Low prob   HGB 12.7    Past Medical History  Diagnosis Date  . Osteopenia   . Hyperlipidemia   . Hypertension   . PSVT (paroxysmal supraventricular tachycardia)   . Spinal stenosis   . Overactive bladder     urge incontinence  . Pulmonary emboli   . Pulmonary hypertension   . Right ventricular failure   . Arthritis     knee  . Anemia    Past Surgical History  Procedure Laterality Date  . Cataract extraction  2008  . Bladder suspension  1993  . Joint replacement  2000    left TKR  . Total knee revision  01/12/2012    Procedure: TOTAL KNEE REVISION;  Surgeon: [name] Salen, MD;  Location: [facility];  Service: Orthopedics;  Laterality: Left;  DEPUY/ LCS , HAND SET  . Trigger finger release  01/12/2012    Procedure: RELEASE TRIGGER FINGER/A-1 PULLEY;  Surgeon: [name] Salen, MD;  Location: [place];  Service: Orthopedics;  Laterality: Right;  . Cholecystectomy    . Hernia repair    . Total knee arthroplasty  10/29/2012    at [facility]    Current Outpatient Prescriptions  Medication Sig Dispense Refill  . acetaminophen (TYLENOL) 500 MG tablet Take 2 tablets every 6 hours as needed for pain      . alum & mag hydroxide-simeth (MAALOX/MYLANTA) 200-200-20 MG/5ML suspension Take 30 mLs by mouth every 4 (four) hours as needed for indigestion or heartburn.      Marland Kitchen ascorbic acid (VITAMIN C) 500 MG tablet Take 500 mg by mouth 3 (three) times daily.       . bisacodyl (DULCOLAX) 10 MG suppository Place 10 mg rectally once. Use daily as needed for constipation      . CALCIUM-VITAMIN D PO Take 1 tablet by mouth 3 (three) times daily.      . metoprolol (LOPRESSOR) 50 MG tablet Take 1 tablet (50 mg total) by mouth daily. For HTN  90 tablet  3  . Multiple Vitamin (MULTIVITAMIN WITH MINERALS) TABS tablet Take 1 tablet by mouth daily.      . ondansetron (ZOFRAN) 4 MG tablet Take 1 tablet (4 mg total) by mouth every 6 (six) hours as needed for nausea.  30 tablet  0  . polyethylene glycol (MIRALAX / GLYCOLAX) packet Take 17 g by mouth daily.       . rivaroxaban (XARELTO) 20 MG TABS tablet Take 20 mg by mouth daily. For anticoagulant      . traMADol (ULTRAM) 50 MG tablet Take 50 mg by mouth every 6 (six) hours as needed.      . verapamil (CALAN-SR) 120 MG CR tablet Take 1 tablet (120 mg total) by mouth at bedtime.  90 tablet  3   No current facility-administered medications for this encounter.    Allergies  Allergen Reactions  . Penicillins Hives  . Sulfonamide Derivatives Hives  . Adhesive [Tape] Other (See Comments)    redness  . Prednisone Other (See Comments)    Causes insomnia     History   Social History  . Marital Status: Married    Spouse Name: N/A    Number of Children: N/A  . Years of Education: N/A   Occupational History  . Retired    Social History Main Topics  . Smoking status: Never Smoker   . Smokeless tobacco: Not on file  . Alcohol Use: No  . Drug Use: No  . Sexual Activity: No   Other Topics Concern  . Not on file   Social History Narrative   Pt lives alone but dtr lives on property and has been staying with her since surgery.    Family History  Problem Relation Age of Onset  . Cancer Mother     breast CA  . Heart disease Father     MI  . Diabetes Father   . Hypertension Father   . Cancer Sister     uterine CA    Physical Exam: Filed Vitals:   06/26/14 1139  BP: 104/62  Pulse: 56  Weight: 198 lb 12.8 oz (90.175 kg)  SpO2: 96%   Hall walk with sats down to 85% on RA  GEN- The patient is elderly appearing, alert and oriented x 3 today.   Head- normocephalic, atraumatic Eyes-  Sclera clear, conjunctiva pink Ears- hearing intact Oropharynx- clear Neck- supple, JVP 8cm Lungs- Clear to ausculation bilaterally, normal work of breathing Heart- Regular rate and rhythm, 2/6 TR loud P2, rubs or gallops, PMI not laterally displaced GI- soft, NT, ND, + BS Extremities- bilateral TKR on left, no edema. Neuro- strength and sensation are intact   Assessment and Plan:  1. Pulmonary HTN 2. Chronic respiratory failure with hypoxia 3. H/o scleroderma but no physical findings to support 4. Remote PE with normal f/u CT and VQ scan  She has evidence of severe PAH without clear attributable cause. I suspect she may have undiagnosed OSA as a component. She desaturates with hall walk.   Will start home O2. Check ANA, anti-Sc70, ANCA, HIV, Hepatitis panels and sleep study. Will arrange for RHC.   See back in several weeks to evaluate results.   Total time spent 45 minutes. Over half that time spent discussing above.   [name] Trombetta,MD 2:17 PM

## 2014-07-04 NOTE — Discharge Instructions (Signed)

## 2014-07-04 NOTE — Interval H&P Note (Signed)
History and Physical Interval Note:  07/04/2014 9:50 AM  [name]  has presented today for surgery, with the diagnosis of pulmonary HTN  The various methods of treatment have been discussed with the patient and family. After consideration of risks, benefits and other options for treatment, the patient has consented to  Procedure(s): RIGHT HEART CATH (N/A) as a surgical intervention .  The patient's history has been reviewed, patient examined, no change in status, stable for surgery.  I have reviewed the patient's chart and labs.  Questions were answered to the patient's satisfaction.     Nahla Lukin

## 2014-07-04 NOTE — CV Procedure (Signed)
Cardiac Cath Procedure Note:  Indication:  Pulmonary HTN on echo  Procedures performed:  1) Right heart catheterization  Description of procedure:   The risks and indication of the procedure were explained. Consent was signed and placed on the chart. An appropriate timeout was taken prior to the procedure. The right neck was prepped and draped in the routine sterile fashion and anesthetized with 1% local lidocaine.   A 7 FR venous sheath was placed in the right internal jugular vein using a modified Seldinger technique. A standard Swan-Ganz catheter was used for the procedure.   Complications: None apparent.  Findings:  RA = 6 RV = 43/2 PA =  43/12 (25) PCW = 16 Fick cardiac output/index = 5.8/2.9 PVR = 1.6 WU Arterial sat = 97% PA sat = 72%, 73%  Assessment: 1. Minimal PAH 2. Normal left-sided pressures, PVR and cardiac output  Plan/Discussion:  Suspect pulmonary pressures improved with diuresis and supplemental O2. Echo may also be overestimating. Will continue O2 and proceed with sleep study. No need for selective pulmonary vasodilators at this point. If symptoms persist can consider repeating RHC with exercise.   [name] MD 9:51 AM

## 2014-07-04 NOTE — Progress Notes (Signed)
Site area: rt IJ( rt neck) Site Prior to Removal:  Level 0 Pressure Applied For: 10 minutes Manual:   yes Patient Status During Pull:  stable Post Pull Site:  Level 0 Post Pull Instructions Given:  yes Post Pull Pulses Present: na Dressing Applied:  yes Bedrest begins @ 1308 Comments: no complications

## 2014-07-05 ENCOUNTER — Telehealth (HOSPITAL_COMMUNITY): Payer: Self-pay | Admitting: Anesthesiology

## 2014-07-07 ENCOUNTER — Ambulatory Visit (HOSPITAL_COMMUNITY)
Admission: RE | Admit: 2014-07-07 | Discharge: 2014-07-07 | Disposition: A | Discharge status: Home / Self Care | Payer: Medicare Other | Source: Ambulatory Visit | Attending: Family Medicine | Admitting: Family Medicine

## 2014-07-07 DIAGNOSIS — Z1231 Encounter for screening mammogram for malignant neoplasm of breast: Secondary | ICD-10-CM | POA: Insufficient documentation

## 2014-07-09 ENCOUNTER — Ambulatory Visit (HOSPITAL_BASED_OUTPATIENT_CLINIC_OR_DEPARTMENT_OTHER): Payer: Medicare Other | Attending: Internal Medicine | Admitting: Radiology

## 2014-07-09 VITALS — Ht 66.0 in | Wt 198.0 lb

## 2014-07-09 DIAGNOSIS — R5383 Other fatigue: Secondary | ICD-10-CM

## 2014-07-09 DIAGNOSIS — G4733 Obstructive sleep apnea (adult) (pediatric): Secondary | ICD-10-CM | POA: Diagnosis not present

## 2014-07-09 DIAGNOSIS — I491 Atrial premature depolarization: Secondary | ICD-10-CM | POA: Insufficient documentation

## 2014-07-09 DIAGNOSIS — I4949 Other premature depolarization: Secondary | ICD-10-CM | POA: Diagnosis not present

## 2014-07-09 DIAGNOSIS — R400 Somnolence: Secondary | ICD-10-CM

## 2014-07-09 DIAGNOSIS — Z9989 Dependence on other enabling machines and devices: Secondary | ICD-10-CM

## 2014-07-09 DIAGNOSIS — I471 Supraventricular tachycardia: Secondary | ICD-10-CM

## 2014-07-11 ENCOUNTER — Encounter: Payer: Self-pay | Admitting: *Deleted

## 2014-07-13 ENCOUNTER — Institutional Professional Consult (permissible substitution): Payer: Medicare Other | Admitting: Internal Medicine

## 2014-07-14 ENCOUNTER — Encounter: Payer: Self-pay | Admitting: Family Medicine

## 2014-07-14 ENCOUNTER — Ambulatory Visit (INDEPENDENT_AMBULATORY_CARE_PROVIDER_SITE_OTHER): Payer: Medicare Other | Admitting: Family Medicine

## 2014-07-14 VITALS — BP 130/85 | HR 58 | Temp 98.1°F | Ht 65.5 in | Wt 197.5 lb

## 2014-07-14 DIAGNOSIS — E7800 Pure hypercholesterolemia, unspecified: Secondary | ICD-10-CM

## 2014-07-14 DIAGNOSIS — Z23 Encounter for immunization: Secondary | ICD-10-CM

## 2014-07-14 DIAGNOSIS — Z1211 Encounter for screening for malignant neoplasm of colon: Secondary | ICD-10-CM

## 2014-07-14 DIAGNOSIS — M949 Disorder of cartilage, unspecified: Secondary | ICD-10-CM

## 2014-07-14 DIAGNOSIS — G4710 Hypersomnia, unspecified: Secondary | ICD-10-CM

## 2014-07-14 DIAGNOSIS — M899 Disorder of bone, unspecified: Secondary | ICD-10-CM

## 2014-07-14 DIAGNOSIS — G4730 Sleep apnea, unspecified: Secondary | ICD-10-CM

## 2014-07-14 DIAGNOSIS — Z0000 Encounter for general adult medical examination without abnormal findings: Secondary | ICD-10-CM

## 2014-07-14 DIAGNOSIS — I10 Essential (primary) hypertension: Secondary | ICD-10-CM

## 2014-07-14 NOTE — Patient Instructions (Signed)
prevnar vaccine today Flu vaccine today Please do the stool card for colon cancer screening  Keep follow up for pulmonary hypertension

## 2014-07-14 NOTE — Sleep Study (Signed)
   NAME: [name] DATE OF BIRTH:  1934-06-24 MEDICAL RECORD NUMBER [id number]  LOCATION: [facility]  PHYSICIAN: Kathee Delton  DATE OF STUDY: 07/09/2014  SLEEP STUDY TYPE: Nocturnal Polysomnogram               REFERRING PHYSICIAN: Thompson Grayer, MD  INDICATION FOR STUDY: Hypersomnia with sleep apnea  EPWORTH SLEEPINESS SCORE:  11 HEIGHT: 5\' 6"  (167.6 cm)  WEIGHT: 198 lb (89.812 kg)    Body mass index is 31.97 kg/(m^2).  NECK SIZE: 13.5 in.  MEDICATIONS: Reviewed in the sleep record  SLEEP ARCHITECTURE: The patient had a total sleep time of 273 minutes with very little slow-wave sleep and decreased quantity of REM. Sleep onset latency was normal, and REM onset was very prolonged.  Efficiency was poor at 64%.  RESPIRATORY DATA: The patient underwent a split night study where she was found to have 118 obstructive events in the first 115 minutes of sleep. This gave her an AHI of 62 events per hour during the diagnostic portion of the study. The events occurred all in the supine position, and there was moderate snoring noted throughout. By protocol, she was fitted with a small Fisher-Paykel Simplus full face mask, and her pressure was adjusted in order to treat obstructive events and snoring. Her CPAP pressure was increased as high as 16 cm of water, but this resulted in increased numbers of pressure induced central apneas. She was changed over to bilevel, actually worsened. Her optimal pressure appears to be around 12 cm of water, and I would start her on this initially to see how she responds. If she does poorly, we can consider an ASV device, depending upon her cardiac history.  OXYGEN DATA: There was oxygen desaturation as low as 69% with the patient's obstructive events  CARDIAC DATA: PACs and PVCs were noted throughout  MOVEMENT/PARASOMNIA: The patient had large numbers of leg jerks with very little sleep disruption. There were no behavioral abnormalities  seen.  IMPRESSION/ RECOMMENDATION:    1) split-night study reveals severe obstructive sleep apnea, with an AHI of 62 events per hour and oxygen desaturation as low as 69% during the diagnostic portion of the study. The patient was then fitted with a small Fisher Paykel Simplus full face mask, and found to have an optimal starting pressure of 12 cm of water. Higher pressures only resulted in worsening pressure induced central apneas, as did a trial of bilevel. I would try the patient on 12 cm of CPAP, and can monitor her progress clinically and with downloads.  She ultimately may require an ASV device if her cardiac status so allows.  She should also be encouraged to work aggressively on weight loss.  2) PACs and PVCs noted throughout    [place], [facility]  ELECTRONICALLY SIGNED ON:  07/14/2014, 6:35 PM [place] PH: [phone]   FX: ([phone]) [place]

## 2014-07-14 NOTE — Progress Notes (Signed)
Subjective:    Patient ID: [name], female    DOB: 1934/04/04, 80 y.o.   MRN: [id number]  HPI I have personally reviewed the Medicare Annual Wellness questionnaire and have noted 1. The patient's medical and social history 2. Their use of alcohol, tobacco or illicit drugs 3. Their current medications and supplements 4. The patient's functional ability including ADL's, fall risks, home safety risks and hearing or visual             impairment. 5. Diet and physical activities 6. Evidence for depression or mood disorders  The patients weight, height, BMI have been recorded in the chart and visual acuity is per eye clinic.  I have made referrals, counseling and provided education to the patient based review of the above and I have provided the pt with a written personalized care plan for preventive services.  Doing fair overall Was dx with pulm HTN  Had sleep study on Sunday- has OSA and that will be treated   Wt is down 1/2 lb  bmi is 32    See scanned forms.  Routine anticipatory guidance given to patient.  See health maintenance. Colon cancer screening 9/03  -diverticulosis / and since over 77 will do stool card for screen  Breast cancer screening- mammogram was normal  Self breast exam- no lumps  Flu vaccine- had that today  Tetanus vaccine  4/13 Pneumovax 10/11 , wants prevnar today  Zoster vaccine 1/09   Advance directive- has that  Cognitive function addressed- see scanned forms- and if abnormal then additional documentation follows. -no concerning memory problems   PMH and SH reviewed  Meds, vitals, and allergies reviewed.   ROS: See HPI.  Otherwise negative.    bp is stable today  No cp or palpitations or headaches or edema  No side effects to medicines  BP Readings from Last 3 Encounters:  07/14/14 140/84  07/04/14 146/67  07/04/14 146/67      Hyperlipidemia Lab Results  Component Value Date   CHOL 104 03/14/2014   HDL 32* 03/14/2014   LDLCALC 53  03/14/2014   TRIG 95 03/14/2014   CHOLHDL 3.3 03/14/2014   she tries to eat a very healthy diet   For exercise - [facility]   [facility] leg [facility]) L leg still bothering her  ? If therapy is helping Is back to walking with cane now   Hx of osteopenia Last dexa was normal in 2013 She is taking her ca and D    Chemistry      Component Value Date/Time   NA 141 07/04/2014 0800   K 4.2 07/04/2014 0800   CL 100 07/04/2014 0800   CO2 33* 07/04/2014 0800   BUN 19 07/04/2014 0800   CREATININE 0.77 07/04/2014 0800   CREATININE 0.85 12/03/2012 1736      Component Value Date/Time   CALCIUM 9.0 07/04/2014 0800   ALKPHOS 66 03/14/2014 0315   AST 25 03/14/2014 0315   ALT 17 03/14/2014 0315   BILITOT 0.7 03/14/2014 0315     Lab Results  Component Value Date   WBC 6.7 07/04/2014   HGB 13.4 07/04/2014   HCT 41.8 07/04/2014   MCV 92.5 07/04/2014   PLT 269 07/04/2014    Lab Results  Component Value Date   TSH 3.600 03/14/2014      Patient Active Problem List   Diagnosis Date Noted  . Colon cancer screening 07/14/2014  .  Chronic respiratory failure 06/27/2014  . UTI (urinary tract infection) 04/05/2014  . Unspecified arthropathy, lower leg 03/21/2014  . Acute blood loss anemia 03/17/2014  . History of right knee joint replacement 03/15/2014  . Encephalopathy acute 03/14/2014  . UTI (lower urinary tract infection) 03/14/2014  . Acute encephalopathy 03/14/2014  . Preop cardiovascular exam 02/23/2014  . Viral gastroenteritis 12/06/2013  . Solitary pulmonary nodule 05/03/2013  . Hypoxia 04/26/2013  . Sternal fracture 04/26/2013  . Atypical chest pain 04/26/2013  . Encounter for Medicare annual wellness exam 01/26/2013  . Right ventricular failure 12/09/2012  . PE (pulmonary embolism) 11/24/2012  . DVT (deep venous thrombosis) 11/24/2012  . Pulmonary hypertension, moderate to severe 11/24/2012  . Painful total knee replacement, dislocating patella 01/12/2012  . PES PLANUS  08/15/2010  . URINARY FREQUENCY, CHRONIC 07/14/2008  . HYPERCHOLESTEROLEMIA, PURE 07/09/2007  . HYPERTENSION, ESSENTIAL NOS 07/09/2007  . PSVT 02/23/2007  . SCLERODERMA 02/23/2007  . DEGENERATIVE JOINT DISEASE 02/23/2007  . SPINAL STENOSIS, LUMBAR 02/23/2007  . OSTEOPENIA 02/23/2007  . SPONDYLOLISTHESIS 02/23/2007   Past Medical History  Diagnosis Date  . Osteopenia   . Hyperlipidemia   . Hypertension   . PSVT (paroxysmal supraventricular tachycardia)   . Spinal stenosis   . Overactive bladder     urge incontinence  . Pulmonary emboli   . Pulmonary hypertension   . Right ventricular failure   . Arthritis     knee  . Anemia    Past Surgical History  Procedure Laterality Date  . Cataract extraction  2008  . Bladder suspension  1993  . Joint replacement  2000    left TKR  . Total knee revision  01/12/2012    Procedure: TOTAL KNEE REVISION;  Surgeon: Kerin Salen, MD;  Location: [place];  Service: Orthopedics;  Laterality: Left;  DEPUY/ LCS , HAND SET  . Trigger finger release  01/12/2012    Procedure: RELEASE TRIGGER FINGER/A-1 PULLEY;  Surgeon: Kerin Salen, MD;  Location: [place];  Service: Orthopedics;  Laterality: Right;  . Cholecystectomy    . Hernia repair    . Total knee arthroplasty  10/29/2012    at [facility]   History  Substance Use Topics  . Smoking status: Never Smoker   . Smokeless tobacco: Not on file  . Alcohol Use: No   Family History  Problem Relation Age of Onset  . Cancer Mother     breast CA  . Heart disease Father     MI  . Diabetes Father   . Hypertension Father   . Cancer Sister     uterine CA   Allergies  Allergen Reactions  . Penicillins Hives  . Sulfonamide Derivatives Hives  . Adhesive [Tape] Other (See Comments)    redness  . Prednisone Other (See Comments)    Causes insomnia   Current Outpatient Prescriptions on File Prior to Visit  Medication Sig Dispense Refill  . Calcium Carb-Cholecalciferol (CALCIUM 600/VITAMIN D3) 600-800  MG-UNIT TABS Take 1 tablet by mouth 2 (two) times daily.      . diphenhydrAMINE (BENADRYL) 25 mg capsule Take 25 mg by mouth daily.      . metoprolol (LOPRESSOR) 50 MG tablet Take 50 mg by mouth daily.      . Multiple Vitamin (MULTIVITAMIN WITH MINERALS) TABS tablet Take 1 tablet by mouth daily.      . polyethylene glycol (MIRALAX / GLYCOLAX) packet Take 8.5 g by mouth daily.       . rivaroxaban (XARELTO)  20 MG TABS tablet Take 20 mg by mouth daily with supper.      . verapamil (CALAN-SR) 120 MG CR tablet Take 120 mg by mouth at bedtime.      . vitamin C (ASCORBIC ACID) 500 MG tablet Take 500 mg by mouth daily.       No current facility-administered medications on file prior to visit.    Review of Systems    Review of Systems  Constitutional: Negative for fever, appetite change, fatigue and unexpected weight change.  Eyes: Negative for pain and visual disturbance.  Respiratory: Negative for cough and pos for baseline shortness of breath.   Cardiovascular: Negative for cp or palpitations    Gastrointestinal: Negative for nausea, diarrhea and constipation.  Genitourinary: Negative for urgency and frequency.  Skin: Negative for pallor or rash   Neurological: Negative for weakness, light-headedness, numbness and headaches.  Hematological: Negative for adenopathy. Does not bruise/bleed easily.  Psychiatric/Behavioral: Negative for dysphoric mood. The patient is not nervous/anxious.      Objective:   Physical Exam  Constitutional: She appears well-developed and well-nourished. No distress.  HENT:  Head: Normocephalic and atraumatic.  Right Ear: External ear normal.  Left Ear: External ear normal.  Mouth/Throat: Oropharynx is clear and moist.  Eyes: Conjunctivae and EOM are normal. Pupils are equal, round, and reactive to light. No scleral icterus.  Neck: Normal range of motion. Neck supple. No JVD present. Carotid bruit is not present. No thyromegaly present.  Cardiovascular: Normal  rate, regular rhythm, normal heart sounds and intact distal pulses.  Exam reveals no gallop.   Pulmonary/Chest: Effort normal and breath sounds normal. No respiratory distress. She has no wheezes. She exhibits no tenderness.  Abdominal: Soft. Bowel sounds are normal. She exhibits no distension, no abdominal bruit and no mass. There is no tenderness.  Genitourinary: No breast swelling, tenderness, discharge or bleeding.  Breast exam: No mass, nodules, thickening, tenderness, bulging, retraction, inflamation, nipple discharge or skin changes noted.  No axillary or clavicular LA.      Musculoskeletal: Normal range of motion. She exhibits no edema and no tenderness.  Lymphadenopathy:    She has no cervical adenopathy.  Neurological: She is alert. She has normal reflexes. No cranial nerve deficit. She exhibits normal muscle tone. Coordination normal.  Skin: Skin is warm and dry. No rash noted. No erythema. No pallor.  Psychiatric: She has a normal mood and affect.          Assessment & Plan:   Problem List Items Addressed This Visit     Cardiovascular and Mediastinum   HYPERTENSION, ESSENTIAL NOS      bp in fair control at this time  BP Readings from Last 1 Encounters:  07/14/14 130/85   No changes needed Disc lifstyle change with low sodium diet and exercise  Labs reviewed       Musculoskeletal and Integument   OSTEOPENIA     dexa showed BMD in the normal range in 2013 Will hold off on the next one quite yet Disc need for calcium/ vitamin D/ wt bearing exercise and bone density test every 2 y to monitor Disc safety/ fracture risk in detail        Other   HYPERCHOLESTEROLEMIA, PURE     Disc goals for lipids and reasons to control them Rev labs with pt Rev low sat fat diet in detail     Encounter for Medicare annual wellness exam - Primary     Reviewed health habits including  diet and exercise and skin cancer prevention Reviewed appropriate screening tests for age  Also  reviewed health mt list, fam hx and immunization status , as well as social and family history    See HPI Flu vaccine and prevnar today  Labs reviewed     Colon cancer screening     Given age -will give IFOB card instead of rec colonoscopy  No symptoms     Relevant Orders      Fecal occult blood, imunochemical    Other Visit Diagnoses   Need for prophylactic vaccination and inoculation against influenza        Relevant Orders       Flu Vaccine QUAD 36+ mos PF IM (Fluarix Quad PF) (Completed)    Need for prophylactic vaccination against Streptococcus pneumoniae (pneumococcus)        Relevant Orders       Pneumococcal conjugate vaccine 13-valent (Completed)

## 2014-07-14 NOTE — Progress Notes (Signed)
Pre visit review using our clinic review tool, if applicable. No additional management support is needed unless otherwise documented below in the visit note. 

## 2014-07-16 NOTE — Assessment & Plan Note (Signed)
dexa showed BMD in the normal range in 2013 Will hold off on the next one quite yet Disc need for calcium/ vitamin D/ wt bearing exercise and bone density test every 2 y to monitor Disc safety/ fracture risk in detail

## 2014-07-16 NOTE — Assessment & Plan Note (Signed)
Disc goals for lipids and reasons to control them Rev labs with pt Rev low sat fat diet in detail   

## 2014-07-16 NOTE — Assessment & Plan Note (Signed)
Reviewed health habits including diet and exercise and skin cancer prevention Reviewed appropriate screening tests for age  Also reviewed health mt list, fam hx and immunization status , as well as social and family history    See HPI Flu vaccine and prevnar today  Labs reviewed

## 2014-07-16 NOTE — Assessment & Plan Note (Signed)
bp in fair control at this time  BP Readings from Last 1 Encounters:  07/14/14 130/85   No changes needed Disc lifstyle change with low sodium diet and exercise  Labs reviewed

## 2014-07-16 NOTE — Assessment & Plan Note (Signed)
Given age -will give IFOB card instead of rec colonoscopy  No symptoms

## 2014-07-19 ENCOUNTER — Other Ambulatory Visit: Payer: Self-pay | Admitting: Internal Medicine

## 2014-07-20 NOTE — Telephone Encounter (Signed)
Entered in error

## 2014-07-21 ENCOUNTER — Ambulatory Visit (HOSPITAL_COMMUNITY)
Admission: RE | Admit: 2014-07-21 | Discharge: 2014-07-21 | Disposition: A | Discharge status: Home / Self Care | Payer: Medicare Other | Source: Ambulatory Visit | Attending: Cardiology | Admitting: Cardiology

## 2014-07-21 ENCOUNTER — Encounter (HOSPITAL_COMMUNITY): Payer: Self-pay

## 2014-07-21 VITALS — BP 130/72 | HR 54 | Wt 199.0 lb

## 2014-07-21 DIAGNOSIS — E669 Obesity, unspecified: Secondary | ICD-10-CM | POA: Insufficient documentation

## 2014-07-21 DIAGNOSIS — G4733 Obstructive sleep apnea (adult) (pediatric): Secondary | ICD-10-CM

## 2014-07-21 DIAGNOSIS — I272 Pulmonary hypertension, unspecified: Secondary | ICD-10-CM

## 2014-07-21 DIAGNOSIS — I10 Essential (primary) hypertension: Secondary | ICD-10-CM | POA: Insufficient documentation

## 2014-07-21 DIAGNOSIS — I2789 Other specified pulmonary heart diseases: Secondary | ICD-10-CM

## 2014-07-21 DIAGNOSIS — G4730 Sleep apnea, unspecified: Secondary | ICD-10-CM

## 2014-07-21 NOTE — Patient Instructions (Signed)
You have been referred to Pulmonary--Dr Elsworth Soho on Mon 07/24/14 at 2:30  Your physician recommends that you schedule a follow-up appointment in: 3 months

## 2014-07-22 DIAGNOSIS — I272 Pulmonary hypertension, unspecified: Secondary | ICD-10-CM | POA: Insufficient documentation

## 2014-07-22 DIAGNOSIS — G4733 Obstructive sleep apnea (adult) (pediatric): Secondary | ICD-10-CM | POA: Insufficient documentation

## 2014-07-22 NOTE — Progress Notes (Signed)
Patient ID: [name], female   DOB: [birth year], 80 y.o.   MRN: [id number]  PCP:  Loura Pardon, MD EP: Dr Rayann Heman   HPI: [name] is a 80 y/o woman referred by Dr. Rayann Heman for further evaluation of pulmonary HTN.   She has a h/o obesity, HTN, PSVT.  She had a total revision/replacement of her L knee replacement by Dr Al Corpus a couple of years ago and this was complicated by a DVT and PE. Then underwent R TKR in 5/15 without problem. She remains on Xarelto.   Main complaint continues to be exertional dyspnea and fatigue. Goes to PT 2x/week. Gets around with a cane when she is out. No edema, orthopnea, PND or presyncope/syncope. Tired all the time.  At last appointment, oxygen level dropped to 85% with exertion, it is ok with rest.  She is not using oxygen with exertion because she does not have a portable tank.     Had Echo 03/10/14: LVEF 55-60% with flat septum. RV moderately dilated. Moderate to severe TR. Mild HK RVSP 73 mmHg.  Sleep study was done showing severe OSA.  She has not yet started CPAP.    RHC (9/15) RA mean 6 PA 43/12, mean 25 PCWP mean 16 PVR 1.6 WU CI 2.9  PFTs (05/25/14)   FEV1  1.88 L (88%) FVC    2.22 L (77%) FEV1/F VC 114% DLCO 68%    CT chest 5/15: no PE. No ILD. + RV strain and coronary calcifications . Esophagus normal VQ 05/30/14: Low prob   Labs:  8/15: HGB 12.7, ANA negative, anti SCL-70 negative, HIV negative 9/15: K 4.2, creatinine 0.77    Past Medical History  Diagnosis Date  . Osteopenia   . Hyperlipidemia   . Hypertension   . PSVT (paroxysmal supraventricular tachycardia)   . Spinal stenosis   . Overactive bladder     urge incontinence  . Pulmonary emboli   . Pulmonary hypertension   . Right ventricular failure   . Arthritis     knee  . Anemia    Past Surgical History  Procedure Laterality Date  . Cataract extraction  2008  . Bladder suspension  1993  . Joint replacement  2000    left TKR  . Total knee revision  01/12/2012     Procedure: TOTAL KNEE REVISION;  Surgeon: Kerin Salen, MD;  Location: [place];  Service: Orthopedics;  Laterality: Left;  DEPUY/ LCS , HAND SET  . Trigger finger release  01/12/2012    Procedure: RELEASE TRIGGER FINGER/A-1 PULLEY;  Surgeon: Kerin Salen, MD;  Location: [place];  Service: Orthopedics;  Laterality: Right;  . Cholecystectomy    . Hernia repair    . Total knee arthroplasty  10/29/2012    at [facility]    Current Outpatient Prescriptions  Medication Sig Dispense Refill  . Calcium Carb-Cholecalciferol (CALCIUM 600/VITAMIN D3) 600-800 MG-UNIT TABS Take 1 tablet by mouth 2 (two) times daily.      . diphenhydrAMINE (BENADRYL) 25 mg capsule Take 25 mg by mouth daily.      . metoprolol (LOPRESSOR) 50 MG tablet Take 50 mg by mouth daily.      . Multiple Vitamin (MULTIVITAMIN WITH MINERALS) TABS tablet Take 1 tablet by mouth daily.      . polyethylene glycol (MIRALAX / GLYCOLAX) packet Take 8.5 g by mouth daily.       . rivaroxaban (XARELTO) 20 MG TABS tablet Take 20 mg by mouth daily with supper.      Marland Kitchen  verapamil (CALAN-SR) 120 MG CR tablet Take 120 mg by mouth at bedtime.      . vitamin C (ASCORBIC ACID) 500 MG tablet Take 500 mg by mouth daily.       No current facility-administered medications for this encounter.    Allergies  Allergen Reactions  . Penicillins Hives  . Sulfonamide Derivatives Hives  . Adhesive [Tape] Other (See Comments)    redness  . Prednisone Other (See Comments)    Causes insomnia    History   Social History  . Marital Status: Widowed    Spouse Name: N/A    Number of Children: N/A  . Years of Education: N/A   Occupational History  . Retired    Social History Main Topics  . Smoking status: Never Smoker   . Smokeless tobacco: Not on file  . Alcohol Use: No  . Drug Use: No  . Sexual Activity: No   Other Topics Concern  . Not on file   Social History Narrative   Pt lives alone but dtr lives on property and has been staying with her since surgery.     Family History  Problem Relation Age of Onset  . Cancer Mother     breast CA  . Heart disease Father     MI  . Diabetes Father   . Hypertension Father   . Cancer Sister     uterine CA    Physical Exam: Filed Vitals:   07/21/14 0958  BP: 130/72  Pulse: 54  Weight: 199 lb (90.266 kg)  SpO2: 93%   GEN- The patient is elderly appearing, alert and oriented x 3 today.   Head- normocephalic, atraumatic Eyes-  Sclera clear, conjunctiva pink Ears- hearing intact Oropharynx- clear Neck- supple, JVP 7 cm Lungs- Clear to ausculation bilaterally, normal work of breathing Heart- Regular rate and rhythm, 2/6 TR, no rubs or gallops, PMI not laterally displaced GI- soft, NT, ND, + BS Extremities- bilateral TKR on left, no edema. Neuro- strength and sensation are intact   Assessment and Plan: 1. Pulmonary HTN: Mild on RHC with PVR only 1.6 WU, suspect over-estimated on echo.  Source of pulmonary hypertension is likely severe OSA +/- OHS.  She did not have significant disease on CT chest or V/Q scan and PFTs were not markedly abnormal.   - She needs referral to pulmonary to start on CPAP.  - She should wear oxygen with exertion, we will work on getting her a portable tank.  2. H/o scleroderma but no physical findings to support and anti SCL-70 antibody negative.  3. Remote PE with normal f/u CT and VQ scan   Fowler Antos,MD 07/22/2014

## 2014-07-24 ENCOUNTER — Other Ambulatory Visit (INDEPENDENT_AMBULATORY_CARE_PROVIDER_SITE_OTHER): Payer: Medicare Other

## 2014-07-24 ENCOUNTER — Ambulatory Visit (INDEPENDENT_AMBULATORY_CARE_PROVIDER_SITE_OTHER): Payer: Medicare Other | Admitting: Pulmonary Disease

## 2014-07-24 ENCOUNTER — Encounter: Payer: Self-pay | Admitting: Pulmonary Disease

## 2014-07-24 VITALS — BP 128/72 | HR 55 | Temp 98.1°F | Ht 66.0 in | Wt 200.6 lb

## 2014-07-24 DIAGNOSIS — I272 Pulmonary hypertension, unspecified: Secondary | ICD-10-CM

## 2014-07-24 DIAGNOSIS — G4733 Obstructive sleep apnea (adult) (pediatric): Secondary | ICD-10-CM

## 2014-07-24 DIAGNOSIS — I2789 Other specified pulmonary heart diseases: Secondary | ICD-10-CM

## 2014-07-24 DIAGNOSIS — Z1211 Encounter for screening for malignant neoplasm of colon: Secondary | ICD-10-CM

## 2014-07-24 LAB — FECAL OCCULT BLOOD, IMMUNOCHEMICAL: Fecal Occult Bld: POSITIVE — AB

## 2014-07-24 NOTE — Progress Notes (Signed)
Subjective:    Patient ID: [name], female    DOB: 03-27-1934, 80 y.o.   MRN: [id number]  HPI PCP -[facility]  80 year old woman with pulmonary hypertension, referred from CHF clinic, for evaluation of sleep disordered breathing.  She had a complicated course after left knee replacement in 2013 due to a DVT/PE.  She presented with exertional dyspnea and fatigue, echo suggested severe pulmonary hypertension, right heart cath however showed only mild pulmonary hypertension with normal resistance and low wedge. Pulmonary embolism was ruled out. Sleep study showed severe OSA, hence the referral. She underwent right TKR in 03/15/14 without problems. She is maintained on xarelto  Significant tests/ events  Echo 03/10/14: LVEF 55-60% with flat septum. RV moderately dilated. Moderate to severe TR. Mild HK RVSP 73 mmHg  RHC (06/2014)  RA mean 6  PA 43/12, mean 25  PCWP mean 16  PVR 1.6 WU  CI 2.9   PFTs (05/25/14) FEV1 1.88 L (88%) FVC 2.22 L (77%) ratio 114, DLCO 68%    CT chest - no PE , mild bronchiectasis at bases, Areas of mild dependent subsegmental atelectasis throughout the lower lobes of the lungs bilaterally VQ 05/30/14: Low prob   PSG 06/2049 >> severe OSA, AHI 62 per hour, corrected by CPAP 12 cm with small fullface mask. Central apneas emergent higher pressures   Epworth sleepiness score is 11.  She ambulates with a walker and reports excessive daytime fatigue. Bedtime is around 10 PM, sleep latency at one hour, she sleeps on her side or her back with one pillow, reports 4 nocturnal awakenings, occasional nocturia, is out of bed by 7 AM feeling tired with dryness of mouth. She's compliant with 2 L of oxygen for the past 2 weeks. She did not desaturate on walking -oxygen saturation stayed above 92%  Past Medical History  Diagnosis Date  . Osteopenia   . Hyperlipidemia   . Hypertension   . PSVT (paroxysmal supraventricular tachycardia)   . Spinal stenosis   . Overactive  bladder     urge incontinence  . Pulmonary emboli   . Pulmonary hypertension   . Right ventricular failure   . Arthritis     knee  . Anemia     Past Surgical History  Procedure Laterality Date  . Cataract extraction  2008  . Bladder suspension  1993  . Joint replacement  2000    left TKR  . Total knee revision  01/12/2012    Procedure: TOTAL KNEE REVISION;  Surgeon: Kerin Salen, MD;  Location: [place];  Service: Orthopedics;  Laterality: Left;  DEPUY/ LCS , HAND SET  . Trigger finger release  01/12/2012    Procedure: RELEASE TRIGGER FINGER/A-1 PULLEY;  Surgeon: Kerin Salen, MD;  Location: [place];  Service: Orthopedics;  Laterality: Right;  . Cholecystectomy    . Hernia repair    . Total knee arthroplasty  10/29/2012    at [name]  . Tna      Allergies  Allergen Reactions  . Penicillins Hives  . Sulfonamide Derivatives Hives  . Adhesive [Tape] Other (See Comments)    redness  . Prednisone Other (See Comments)    Causes insomnia    History   Social History  . Marital Status: Widowed    Spouse Name: N/A    Number of Children: 3  . Years of Education: N/A   Occupational History  . Retired   .     Social History Main Topics  .  Smoking status: Never Smoker   . Smokeless tobacco: Not on file  . Alcohol Use: No  . Drug Use: No  . Sexual Activity: No   Other Topics Concern  . Not on file   Social History Narrative   Pt lives alone but dtr lives on property and has been staying with her since surgery.    Family History  Problem Relation Age of Onset  . Cancer Mother     breast CA  . Heart disease Father     MI  . Diabetes Father   . Hypertension Father   . Cancer Sister     uterine CA     Review of Systems  Constitutional: Negative for fever and unexpected weight change.  HENT: Negative for congestion, dental problem, ear pain, nosebleeds, postnasal drip, rhinorrhea, sinus pressure, sneezing, sore throat and trouble swallowing.   Eyes: Negative for  redness and itching.  Respiratory: Negative for cough, chest tightness, shortness of breath and wheezing.   Cardiovascular: Negative for palpitations and leg swelling.  Gastrointestinal: Negative for nausea and vomiting.  Genitourinary: Negative for dysuria.  Musculoskeletal: Negative for joint swelling.  Skin: Negative for rash.  Neurological: Negative for headaches.  Hematological: Does not bruise/bleed easily.  Psychiatric/Behavioral: Negative for dysphoric mood. The patient is not nervous/anxious.        Objective:   Physical Exam  Gen. Pleasant, obese, in no distress, normal affect ENT - no lesions, no post nasal drip, class 2-3 airway Neck: No JVD, no thyromegaly, no carotid bruits Lungs: no use of accessory muscles, no dullness to percussion, decreased without rales or rhonchi  Cardiovascular: Rhythm regular, heart sounds  normal, no murmurs or gallops, no peripheral edema Abdomen: soft and non-tender, no hepatosplenomegaly, BS normal. Musculoskeletal: No deformities, no cyanosis or clubbing Neuro:  alert, non focal, no tremors        Assessment & Plan:

## 2014-07-24 NOTE — Patient Instructions (Signed)
We will set you up with a CPAP machine Use it every night for at least 6h

## 2014-07-24 NOTE — Assessment & Plan Note (Addendum)
CPAP 12 cm, small FF mask, download in 4 wks If significant central apneas noted an increasing pressures, she may need ASV mode  Weight loss encouraged, compliance with goal of at least 4-6 hrs every night is the expectation. Advised against medications with sedative side effects Cautioned against driving when sleepy - understanding that sleepiness will vary on a day to day basis

## 2014-07-25 NOTE — Assessment & Plan Note (Signed)
Echo seems to overestimate Mild pulmonary hypertension may be residual from old PE, or related to this degree of sleep disordered breathing. Continue oxygen during sleep and blended into CPAP, in the future we will recheck oximetry on CPAP, depending on tolerance, likely will not need oxygen. She does not seem to desaturate on walking, certainly does not need portable oxygen at this time

## 2014-07-26 ENCOUNTER — Ambulatory Visit (INDEPENDENT_AMBULATORY_CARE_PROVIDER_SITE_OTHER): Payer: Medicare Other | Admitting: Emergency Medicine

## 2014-07-26 ENCOUNTER — Encounter: Payer: Self-pay | Admitting: Emergency Medicine

## 2014-07-26 VITALS — BP 114/68 | HR 56 | Temp 97.7°F | Ht 66.0 in | Wt 200.6 lb

## 2014-07-26 DIAGNOSIS — R0600 Dyspnea, unspecified: Secondary | ICD-10-CM | POA: Insufficient documentation

## 2014-07-26 DIAGNOSIS — R0989 Other specified symptoms and signs involving the circulatory and respiratory systems: Secondary | ICD-10-CM

## 2014-07-26 DIAGNOSIS — I2789 Other specified pulmonary heart diseases: Secondary | ICD-10-CM

## 2014-07-26 DIAGNOSIS — R911 Solitary pulmonary nodule: Secondary | ICD-10-CM

## 2014-07-26 DIAGNOSIS — R0609 Other forms of dyspnea: Secondary | ICD-10-CM

## 2014-07-26 DIAGNOSIS — G4733 Obstructive sleep apnea (adult) (pediatric): Secondary | ICD-10-CM

## 2014-07-26 DIAGNOSIS — I272 Pulmonary hypertension, unspecified: Secondary | ICD-10-CM

## 2014-07-26 MED ORDER — AEROCHAMBER MV MISC: Status: DC

## 2014-07-26 NOTE — Patient Instructions (Signed)
Walking oximetry today on room air.  Please start CPAP as planned by Dr Elsworth Soho.  Continue your xarelto.  Follow with Dr Elsworth Soho in 2-3 months to assess your progress on the CPAP.

## 2014-07-26 NOTE — Assessment & Plan Note (Signed)
SOB with large contribution of fatigue. Her eval today has suggested secondary PAH although R heart cath showed only minimal (PAPm 25). The w/u for [facility] has been thorough > hx PE, OSA, systemic HTN appear to be the contributing factors. Her PFT and chest CT do not suggest / support AFL or ILD respectively. At this point largest impact we can make is to keep her well oxygenated both at night and w exertion.  - start CPAP as planned - walking oximetry and start O2 w exertion if indicated.  - continue xarelto, likely needs for life - follow with Dr Elsworth Soho to assess CPAP compliance and benefit

## 2014-07-26 NOTE — Assessment & Plan Note (Signed)
R heart cath reassuring

## 2014-07-26 NOTE — Assessment & Plan Note (Signed)
I do not see a nodule on the CT from 03/13/14

## 2014-07-26 NOTE — Assessment & Plan Note (Signed)
Severe on PSG - initiating CPAP 12 cm H2O

## 2014-07-26 NOTE — Progress Notes (Signed)
Subjective:    Patient ID: [name], female    DOB: 11/18/1933, 80 y.o.   MRN: [id number]  HPI 80 yo woman, never smoker, hx HTN, PE and PSVT, followed in CHF clinic and with PAH by TTE but not confirmed on R heart cath (PAP 43/12, 25, PAOP 16). V/q was without PE's, a prior CT scan chest showed no evidence ILD. Autoimmune labs showed a positive ANA but at an insignificant titer. She has been found to have severe OSA and saw Dr Elsworth Soho 2 days ago to start CPAP.  She has been having severe fatigue. She states that her breathing is not very limiting, but she has been given O2 to use w exertion. She denies cough or wheeze. She is on xarelto. PFT show restriction without significant AFL.    Review of Systems  Constitutional: Negative for fever and unexpected weight change.  HENT: Negative for congestion, dental problem, ear pain, nosebleeds, postnasal drip, rhinorrhea, sinus pressure, sneezing, sore throat and trouble swallowing.   Eyes: Negative for redness and itching.  Respiratory: Positive for shortness of breath. Negative for cough, chest tightness and wheezing.   Cardiovascular: Negative for palpitations and leg swelling.  Gastrointestinal: Negative for nausea and vomiting.  Genitourinary: Negative for dysuria.  Musculoskeletal: Negative for joint swelling.  Skin: Negative for rash.  Neurological: Negative for headaches.  Hematological: Does not bruise/bleed easily.  Psychiatric/Behavioral: Negative for dysphoric mood. The patient is not nervous/anxious.     Past Medical History  Diagnosis Date  . Osteopenia   . Hyperlipidemia   . Hypertension   . PSVT (paroxysmal supraventricular tachycardia)   . Spinal stenosis   . Overactive bladder     urge incontinence  . Pulmonary emboli   . Pulmonary hypertension   . Right ventricular failure   . Arthritis     knee  . Anemia      Family History  Problem Relation Age of Onset  . Cancer Mother     breast CA  . Heart disease  Father   . Diabetes Father   . Hypertension Father   . Cancer Sister     uterine CA     History   Social History  . Marital Status: Widowed    Spouse Name: N/A    Number of Children: 3  . Years of Education: N/A   Occupational History  . Retired   .     Social History Main Topics  . Smoking status: Never Smoker   . Smokeless tobacco: Not on file  . Alcohol Use: No  . Drug Use: No  . Sexual Activity: No   Other Topics Concern  . Not on file   Social History Narrative   Pt lives alone but dtr lives on property and has been staying with her since surgery.     Allergies  Allergen Reactions  . Penicillins Hives  . Sulfonamide Derivatives Hives  . Adhesive [Tape] Other (See Comments)    redness  . Prednisone Other (See Comments)    Causes insomnia     Outpatient Prescriptions Prior to Visit  Medication Sig Dispense Refill  . Calcium Carb-Cholecalciferol (CALCIUM 600/VITAMIN D3) 600-800 MG-UNIT TABS Take 1 tablet by mouth 2 (two) times daily.      . diphenhydrAMINE (BENADRYL) 25 mg capsule Take 25 mg by mouth daily.      . metoprolol (LOPRESSOR) 50 MG tablet Take 50 mg by mouth daily.      . Multiple Vitamin (MULTIVITAMIN WITH  MINERALS) TABS tablet Take 1 tablet by mouth daily.      . polyethylene glycol (MIRALAX / GLYCOLAX) packet Take 8.5 g by mouth daily.       . rivaroxaban (XARELTO) 20 MG TABS tablet Take 20 mg by mouth daily with supper.      . verapamil (CALAN-SR) 120 MG CR tablet Take 120 mg by mouth at bedtime.      . vitamin C (ASCORBIC ACID) 500 MG tablet Take 500 mg by mouth daily.       No facility-administered medications prior to visit.         Objective:   Physical Exam Filed Vitals:   07/26/14 1345  BP: 114/68  Pulse: 56  Temp: 97.7 F (36.5 C)  TempSrc: Oral  Height: 5\' 6"  (1.676 m)  Weight: 200 lb 9.6 oz (90.992 kg)  SpO2: 93%   Gen: Pleasant, overwt, in no distress,  normal affect  ENT: No lesions,  mouth clear, oropharynx clear,  no postnasal drip  Neck: No JVD, no TMG, no carotid bruits  Lungs: No use of accessory muscles, clear without rales or rhonchi  Cardiovascular: RRR, heart sounds normal, no murmur or gallops, trace peripheral edema  Musculoskeletal: hx B knee surgeries, no cyanosis or clubbing  Neuro: alert, non focal  Skin: Warm, no lesions or rashes      Assessment & Plan:  Solitary pulmonary nodule I do not see a nodule on the CT from 03/13/14  Dyspnea SOB with large contribution of fatigue. Her eval today has suggested secondary PAH although R heart cath showed only minimal (PAPm 25). The w/u for [facility] has been thorough > hx PE, OSA, systemic HTN appear to be the contributing factors. Her PFT and chest CT do not suggest / support AFL or ILD respectively. At this point largest impact we can make is to keep her well oxygenated both at night and w exertion.  - start CPAP as planned - walking oximetry and start O2 w exertion if indicated.  - continue xarelto, likely needs for life - follow with Dr Elsworth Soho to assess CPAP compliance and benefit  OSA (obstructive sleep apnea) Severe on PSG - initiating CPAP 12 cm H2O  Pulmonary hypertension, moderate to severe R heart cath reassuring

## 2014-07-30 ENCOUNTER — Telehealth: Payer: Self-pay | Admitting: Family Medicine

## 2014-07-30 DIAGNOSIS — R195 Other fecal abnormalities: Secondary | ICD-10-CM

## 2014-07-30 NOTE — Telephone Encounter (Signed)
Message copied by Abner Greenspan on Sun Jul 30, 2014 12:48 PM ------      Message from: Pete Pelt      Created: Fri Jul 28, 2014  1:57 PM       Patient notified as instructed by telephone. Was advised by patient that she was not having any rectal bleeding or problems with hemorrhoids during that time. Patient stated that she has seen a GI doctor in the past, but it has been a long time. Advised patient that the referral coordinator will be in touch with her to get this set up. ------

## 2014-08-04 ENCOUNTER — Encounter: Payer: Self-pay | Admitting: Internal Medicine

## 2014-08-07 ENCOUNTER — Telehealth: Payer: Self-pay | Admitting: Internal Medicine

## 2014-08-07 NOTE — Telephone Encounter (Signed)
Per Corene Cornea w/ [facility] they now order and will start on this. Pt aware and nothing further needed

## 2014-08-07 NOTE — Telephone Encounter (Signed)
Pt returned call [phone]

## 2014-08-07 NOTE — Telephone Encounter (Signed)
lmtcb x1 for pt Staff message sent to [place] advising on order

## 2014-08-07 NOTE — Telephone Encounter (Signed)
Called spoke with pt. She reports she has not heard about her CPAP. Pt aware we have sent them a message and will let her know what they tell us. Await message from [facility]

## 2014-09-11 ENCOUNTER — Encounter: Payer: Self-pay | Admitting: Pulmonary Disease

## 2014-09-11 ENCOUNTER — Ambulatory Visit (INDEPENDENT_AMBULATORY_CARE_PROVIDER_SITE_OTHER): Payer: Medicare Other | Admitting: Pulmonary Disease

## 2014-09-11 VITALS — BP 122/76 | HR 58 | Temp 97.4°F | Ht 66.0 in | Wt 200.0 lb

## 2014-09-11 DIAGNOSIS — G4733 Obstructive sleep apnea (adult) (pediatric): Secondary | ICD-10-CM

## 2014-09-11 DIAGNOSIS — I2699 Other pulmonary embolism without acute cor pulmonale: Secondary | ICD-10-CM

## 2014-09-11 NOTE — Assessment & Plan Note (Signed)
Consider stopping xarelto in the future - her risk is above avg but clearly PE was triggered by TKR in 2013 No evidence for NoAcs in [facility]

## 2014-09-11 NOTE — Assessment & Plan Note (Signed)
CPAP is set at 12 cm She is adjusting well - no central apneas on download  Weight loss encouraged, compliance with goal of at least 4-6 hrs every night is the expectation. Advised against medications with sedative side effects Cautioned against driving when sleepy - understanding that sleepiness will vary on a day to day basis

## 2014-09-11 NOTE — Patient Instructions (Signed)
Your CPAP is set at 12 cm You are adjusting well We can consider stopping your blood thinner in the future

## 2014-09-11 NOTE — Progress Notes (Signed)
   Subjective:    Patient ID: [name], female    DOB: 12-25-1933, 80 y.o.   MRN: [id number]  HPI  80 year old woman with pulmonary hypertension, referred from CHF clinic, for FU of OSA  She had a complicated course after left knee replacement in 2013 due to a DVT/PE.  She presented with exertional dyspnea and fatigue, echo suggested severe pulmonary hypertension, right heart cath however showed only mild pulmonary hypertension with normal resistance and low wedge. Pulmonary embolism was ruled out. Sleep study showed severe OSA, hence the referral. She underwent right TKR in 03/15/14 without problems. She is maintained on xarelto  Significant tests/ events  Echo 03/10/14: LVEF 55-60% with flat septum. RV moderately dilated. Moderate to severe TR. Mild HK RVSP 73 mmHg  RHC (06/2014)  RA mean 6  PA 43/12, mean 25  PCWP mean 16  PVR 1.6 WU  CI 2.9   PFTs (05/25/14) FEV1 1.88 L (88%) FVC 2.22 L (77%) ratio 114, DLCO 68%    CT chest - no PE , mild bronchiectasis at bases, Areas of mild dependent subsegmental atelectasis throughout the lower lobes of the lungs bilaterally VQ 05/30/14: Low prob   PSG 06/2014 >> severe OSA, AHI 62 per hour, corrected by CPAP 12 cm with small fullface mask. Central apneas emergent higher pressures   Chief Complaint  Patient presents with  . Follow-up    f/u OSA; CPAP works well.  wears CPAP approx 6 hours nightly at 12cm. still feeling tired during the day    Download on 12 cm 08/2014 >> AHI 3/h, >6h usage, AHI 3/h Feels better rested, denies morning headaches, has some dryness of mouth in the morning. Is adjusting to sleep apnea-pressure okay, using full face mask and tolerating well  Review of Systems neg for any significant sore throat, dysphagia, itching, sneezing, nasal congestion or excess/ purulent secretions, fever, chills, sweats, unintended wt loss, pleuritic or exertional cp, hempoptysis, orthopnea pnd or change in chronic leg swelling.  Also denies presyncope, palpitations, heartburn, abdominal pain, nausea, vomiting, diarrhea or change in bowel or urinary habits, dysuria,hematuria, rash, arthralgias, visual complaints, headache, numbness weakness or ataxia.     Objective:   Physical Exam  Gen. Pleasant, well-nourished, in no distress ENT - no lesions, no post nasal drip Neck: No JVD, no thyromegaly, no carotid bruits Lungs: no use of accessory muscles, no dullness to percussion, clear without rales or rhonchi  Cardiovascular: Rhythm regular, heart sounds  normal, no murmurs or gallops, no peripheral edema Musculoskeletal: No deformities, no cyanosis or clubbing         Assessment & Plan:

## 2014-09-25 ENCOUNTER — Telehealth: Payer: Self-pay | Admitting: Pulmonary Disease

## 2014-09-25 DIAGNOSIS — G4733 Obstructive sleep apnea (adult) (pediatric): Secondary | ICD-10-CM

## 2014-09-25 NOTE — Telephone Encounter (Signed)
LMTCB

## 2014-09-26 NOTE — Telephone Encounter (Signed)
Spoke with [name] at [facility] he will have results faxed to triage fax - Preliminary results - Lowest sat 69% - Time spent below 88% was 71.1 min - Will await results in triage.

## 2014-09-26 NOTE — Telephone Encounter (Signed)
Will forward to [place] to f/u on results.

## 2014-09-27 NOTE — Telephone Encounter (Signed)
[name] have you received results yet? Pt aware still working on results.

## 2014-09-27 NOTE — Telephone Encounter (Signed)
[phone] pt is wanting to get her oxygen picked up she was still waiting on ono results

## 2014-09-29 NOTE — Telephone Encounter (Signed)
Received results, in Dr. Bari Mantis box awaiting review.  Called and advised patient that we have received the results and we will call her next week to let her know the results once Dr. Elsworth Soho has reviewed them.

## 2014-10-03 ENCOUNTER — Encounter: Payer: Self-pay | Admitting: Gastroenterology

## 2014-10-03 NOTE — Telephone Encounter (Signed)
ONO Ordered.,  Patient notified.  Nothing further needed.

## 2014-10-03 NOTE — Telephone Encounter (Signed)
CPAP download 08/2014 - no residuals on 12 cm ONO on CPPA/RA shows 71 min desatn Suggest - add 2L O2 to CPAP & recheck ONO

## 2014-10-03 NOTE — Telephone Encounter (Signed)
Patient still awaiting results of her ONO.

## 2014-10-05 ENCOUNTER — Encounter (HOSPITAL_COMMUNITY): Payer: Self-pay | Admitting: Internal Medicine

## 2014-10-06 ENCOUNTER — Telehealth: Payer: Self-pay

## 2014-10-06 ENCOUNTER — Encounter: Payer: Self-pay | Admitting: Internal Medicine

## 2014-10-06 ENCOUNTER — Ambulatory Visit (INDEPENDENT_AMBULATORY_CARE_PROVIDER_SITE_OTHER): Payer: Medicare Other | Admitting: Internal Medicine

## 2014-10-06 VITALS — BP 108/62 | HR 56 | Ht 66.0 in | Wt 204.4 lb

## 2014-10-06 DIAGNOSIS — Z7901 Long term (current) use of anticoagulants: Secondary | ICD-10-CM

## 2014-10-06 DIAGNOSIS — R195 Other fecal abnormalities: Secondary | ICD-10-CM

## 2014-10-06 MED ORDER — MOVIPREP 100 G PO SOLR: ORAL | Status: DC

## 2014-10-06 NOTE — Telephone Encounter (Signed)
[facility] [address] [place] [address]  10/06/2014   RE: [name] DOB: 1934-05-07 MRN: [id number]   Dear [name],    We have scheduled the above patient for an endoscopic procedure. Our records show that she is on anticoagulation therapy.   Please advise as to how long the patient may come off her therapy of xarelto prior to the colonoscopy procedure, which is scheduled for 10/24/14.  Please fax back/ or route the completed form to [name], [place] at [phone].   Sincerely,     Silvano Rusk, MD

## 2014-10-06 NOTE — Progress Notes (Signed)
Referred by Dr. Tower Subjective:    Patient ID: [name], female    DOB: 11/06/1933, 80 y.o.   MRN: [id number]  HPI Is a very nice 80-year-old woman with pulmonary hypertension a remote history of a perioperative DVT and pulmonary embolism, on Xarelto with a heme positive stool. She had a positive immune based fecal occult blood test earlier this year. She denies any GI symptoms at all. No rectal bleeding or melena. She does use a stool softener however which keeps her regular with respect to defecation. He exercises at the [employer] 3 times a week. She feels like she living life well and actively. Allergies  Allergen Reactions  . Penicillins Hives  . Sulfonamide Derivatives Hives  . Adhesive [Tape] Other (See Comments)    redness  . Prednisone Other (See Comments)    Causes insomnia   Outpatient Prescriptions Prior to Visit  Medication Sig Dispense Refill  . Calcium Carb-Cholecalciferol (CALCIUM 600/VITAMIN D3) 600-800 MG-UNIT TABS Take 1 tablet by mouth 2 (two) times daily.    . diphenhydrAMINE (BENADRYL) 25 mg capsule Take 25 mg by mouth daily.    . metoprolol (LOPRESSOR) 50 MG tablet Take 50 mg by mouth daily.    . Multiple Vitamin (MULTIVITAMIN WITH MINERALS) TABS tablet Take 1 tablet by mouth daily.    . polyethylene glycol (MIRALAX / GLYCOLAX) packet Take 8.5 g by mouth daily.     . rivaroxaban (XARELTO) 20 MG TABS tablet Take 20 mg by mouth daily with supper.    . verapamil (CALAN-SR) 120 MG CR tablet Take 120 mg by mouth at bedtime.    . vitamin C (ASCORBIC ACID) 500 MG tablet Take 500 mg by mouth daily.    . Spacer/Aero-Holding Chambers (AEROCHAMBER MV) inhaler Use as instructed 1 each 0   No facility-administered medications prior to visit.   Past Medical History  Diagnosis Date  . Osteopenia   . Hyperlipidemia   . Hypertension   . PSVT (paroxysmal supraventricular tachycardia)   . Spinal stenosis   . Overactive bladder     urge incontinence  . Pulmonary emboli     . Pulmonary hypertension   . Right ventricular failure   . Arthritis     knee  . Anemia   . Diverticulosis   . Sleep apnea     CPAP   Past Surgical History  Procedure Laterality Date  . Cataract extraction Bilateral 2008  . Bladder suspension  1993  . Total knee arthroplasty Left 2000  . Total knee revision  01/12/2012    Procedure: TOTAL KNEE REVISION;  Surgeon: Frank J Rowan, MD;  Location: [facility] OR;  Service: Orthopedics;  Laterality: Left;  DEPUY/ LCS , HAND SET  . Trigger finger release  01/12/2012    Procedure: RELEASE TRIGGER FINGER/A-1 PULLEY;  Surgeon: Frank J Rowan, MD;  Location: [facility] OR;  Service: Orthopedics;  Laterality: Right;  . Cholecystectomy    . Incisional hernia repair    . Total knee arthroplasty Left 10/29/2012    at [facility]  . Total knee arthroplasty Right 03/09/2014  . Colonoscopy    . Right heart catheterization N/A 07/04/2014    Procedure: RIGHT HEART CATH;  Surgeon: Daniel R Bensimhon, MD;  Location: [facility] CATH LAB;  Service: Cardiovascular;  Laterality: N/A;  . Tonsillectomy and adenoidectomy     History   Social History  . Marital Status: Widowed    Spouse Name: N/A    Number of Children: 3  . Years of Education:   N/A   Occupational History  . Retired   .     Social History Main Topics  . Smoking status: Never Smoker   . Smokeless tobacco: Never Used  . Alcohol Use: No  . Drug Use: No  . Sexual Activity: No   Other Topics Concern  . None   Social History Narrative   Pt lives alone but dtr lives on property and has been staying with her since surgery.   Family History  Problem Relation Age of Onset  . Breast cancer Mother   . Heart disease Father   . Diabetes Father   . Hypertension Father   . Uterine cancer Sister         Review of Systems Ambulates with a cane, wears CPAP at night with O2  Wears eyeglasses    Objective:   Physical Exam General:  Well-developed, well-nourished and in no acute distress Eyes:  anicteric. Neck:    supple w/o thyromegaly or mass.  Lungs: Clear to auscultation bilaterally. Heart:  S1S2, no rubs, murmurs, gallops. Abdomen:  soft, non-tender, no hepatosplenomegaly, hernia, or mass and BS+.  Rectal: This is deferred until colonoscopy Lymph:  no cervical or supraclavicular adenopathy. Extremities:   no edema Skin   no rash. Neuro:  A&O x 3.  Psych:  appropriate mood and  Affect.   Data Reviewed: Labs in EMR. She is not anemic. I reviewed cardiology and pulmonary notes from the last several months.    Assessment & Plan:   1. Heme + stool   2. Long term current use of anticoagulant therapy - Xarelto    1. We discussed the risks benefits and indications of colonoscopy for heme positive stool. She does have comorbidities but is still very functional. We have decided to proceed with colonoscopy. This will be scheduled at the hospital at the end of December. 2. Will query Dr. Bensimhon regarding temporary holding of Xarelto prior to and after colonoscopy. 3. She takes verapamil, and she is on MiraLAX regularly so we'll use MoviPrep or SuPrep purge for colonoscopy   

## 2014-10-06 NOTE — Patient Instructions (Signed)
You have been scheduled for a colonoscopy. Please follow written instructions given to you at your visit today.  Please pick up your prep kit at the pharmacy within the next 1-3 days. If you use inhalers (even only as needed), please bring them with you on the day of your procedure. Your physician has requested that you go to www.startemmi.com and enter the access code given to you at your visit today. This web site gives a general overview about your procedure. However, you should still follow specific instructions given to you by our office regarding your preparation for the procedure.  You will be contaced by our office prior to your procedure for directions on holding your xarelto.  If you do not hear from our office 1 week prior to your scheduled procedure, please call [phone] to discuss.   I appreciate the opportunity to care for you.

## 2014-10-08 ENCOUNTER — Other Ambulatory Visit: Payer: Self-pay | Admitting: Internal Medicine

## 2014-10-12 ENCOUNTER — Telehealth: Payer: Self-pay | Admitting: Pulmonary Disease

## 2014-10-12 NOTE — Telephone Encounter (Signed)
ONO on CPAP/2 L O2  >> no desatn Oxygen is helping, continue on this

## 2014-10-12 NOTE — Telephone Encounter (Signed)
Faxed the anti-coag letter to Dr. Haroldine Laws again since we haven't heard back yet for clearance.

## 2014-10-13 ENCOUNTER — Ambulatory Visit (INDEPENDENT_AMBULATORY_CARE_PROVIDER_SITE_OTHER): Payer: Medicare Other | Admitting: Family Medicine

## 2014-10-13 ENCOUNTER — Encounter: Payer: Self-pay | Admitting: Family Medicine

## 2014-10-13 VITALS — BP 122/66 | HR 57 | Temp 97.4°F | Ht 66.0 in | Wt 203.2 lb

## 2014-10-13 DIAGNOSIS — N390 Urinary tract infection, site not specified: Secondary | ICD-10-CM | POA: Insufficient documentation

## 2014-10-13 DIAGNOSIS — R350 Frequency of micturition: Secondary | ICD-10-CM

## 2014-10-13 DIAGNOSIS — N3000 Acute cystitis without hematuria: Secondary | ICD-10-CM

## 2014-10-13 LAB — POCT URINALYSIS DIPSTICK
GLUCOSE UA: NEGATIVE
Nitrite, UA: NEGATIVE
Urobilinogen, UA: 0.2
pH, UA: 6

## 2014-10-13 MED ORDER — CIPROFLOXACIN HCL 250 MG PO TABS: 250.0000 mg | ORAL_TABLET | Freq: Two times a day (BID) | ORAL | Status: DC

## 2014-10-13 NOTE — Assessment & Plan Note (Signed)
Pos ua today tx with cipro and sent for cx

## 2014-10-13 NOTE — Patient Instructions (Signed)
Drink lots of water Take cipro as directed  We sent urine for a culture- will update you with results

## 2014-10-13 NOTE — Progress Notes (Signed)
Subjective:    Patient ID: [name], female    DOB: 09-23-1934, 80 y.o.   MRN: [id number]  HPI Here for uti symptoms   Results for orders placed or performed in visit on 10/13/14  POCT urinalysis dipstick  Result Value Ref Range   Color, UA amber    Clarity, UA hazy    Glucose, UA neg.    Bilirubin, UA trace    Ketones, UA small    Spec Grav, UA >=1.030    Blood, UA Moderate    pH, UA 6.0    Protein, UA 15+    Urobilinogen, UA 0.2    Nitrite, UA neg.    Leukocytes, UA moderate (2+)    pos ua today  Started having urinary symptoms around the first of the week  Now frequency -on and off  No burning  No blood in urine No fever  No flank pain   Just feels lousy   She has to wear incont pads baseline   Patient Active Problem List   Diagnosis Date Noted  . Occult blood positive stool 07/30/2014  . Dyspnea 07/26/2014  . OSA (obstructive sleep apnea) 07/22/2014  . Colon cancer screening 07/14/2014  . Unspecified arthropathy, lower leg 03/21/2014  . History of right knee joint replacement 03/15/2014  . Preop cardiovascular exam 02/23/2014  . Solitary pulmonary nodule 05/03/2013  . Hypoxia 04/26/2013  . Sternal fracture 04/26/2013  . Encounter for Medicare annual wellness exam 01/26/2013  . Right ventricular failure 12/09/2012  . PE (pulmonary embolism) 11/24/2012  . DVT (deep venous thrombosis) 11/24/2012  . Pulmonary hypertension, moderate to severe 11/24/2012  . PES PLANUS 08/15/2010  . URINARY FREQUENCY, CHRONIC 07/14/2008  . HYPERCHOLESTEROLEMIA, PURE 07/09/2007  . HYPERTENSION, ESSENTIAL NOS 07/09/2007  . PSVT 02/23/2007  . SCLERODERMA 02/23/2007  . DEGENERATIVE JOINT DISEASE 02/23/2007  . SPINAL STENOSIS, LUMBAR 02/23/2007  . OSTEOPENIA 02/23/2007  . SPONDYLOLISTHESIS 02/23/2007   Past Medical History  Diagnosis Date  . Osteopenia   . Hyperlipidemia   . Hypertension   . PSVT (paroxysmal supraventricular tachycardia)   . Spinal stenosis   .  Overactive bladder     urge incontinence  . Pulmonary emboli   . Pulmonary hypertension   . Right ventricular failure   . Arthritis     knee  . Anemia   . Diverticulosis   . Sleep apnea     CPAP   Past Surgical History  Procedure Laterality Date  . Cataract extraction Bilateral 2008  . Bladder suspension  1993  . Total knee arthroplasty Left 2000  . Total knee revision  01/12/2012    Procedure: TOTAL KNEE REVISION;  Surgeon: Kerin Salen, MD;  Location: [place];  Service: Orthopedics;  Laterality: Left;  DEPUY/ LCS , HAND SET  . Trigger finger release  01/12/2012    Procedure: RELEASE TRIGGER FINGER/A-1 PULLEY;  Surgeon: Kerin Salen, MD;  Location: [place];  Service: Orthopedics;  Laterality: Right;  . Cholecystectomy    . Incisional hernia repair    . Total knee arthroplasty Left 10/29/2012    at [facility]  . Total knee arthroplasty Right 03/09/2014  . Colonoscopy    . Right heart catheterization N/A 07/04/2014    Procedure: RIGHT HEART CATH;  Surgeon: Jolaine Artist, MD;  Location: [facility] CATH LAB;  Service: Cardiovascular;  Laterality: N/A;  . Tonsillectomy and adenoidectomy     History  Substance Use Topics  . Smoking status: Never Smoker   .  Smokeless tobacco: Never Used  . Alcohol Use: No   Family History  Problem Relation Age of Onset  . Breast cancer Mother   . Heart disease Father   . Diabetes Father   . Hypertension Father   . Uterine cancer Sister    Allergies  Allergen Reactions  . Penicillins Hives  . Sulfonamide Derivatives Hives  . Adhesive [Tape] Other (See Comments)    redness  . Prednisone Other (See Comments)    Causes insomnia   Current Outpatient Prescriptions on File Prior to Visit  Medication Sig Dispense Refill  . Calcium Carb-Cholecalciferol (CALCIUM 600/VITAMIN D3) 600-800 MG-UNIT TABS Take 1 tablet by mouth 2 (two) times daily.    . diphenhydrAMINE (BENADRYL) 25 mg capsule Take 25 mg by mouth daily.    . metoprolol (LOPRESSOR) 50 MG tablet Take  50 mg by mouth daily.    Marland Kitchen MOVIPREP 100 G SOLR Use per prep instructions 1 kit 0  . Multiple Vitamin (MULTIVITAMIN WITH MINERALS) TABS tablet Take 1 tablet by mouth daily.    . OXYGEN Inhale 2 L/min into the lungs at bedtime.    . polyethylene glycol (MIRALAX / GLYCOLAX) packet Take 8.5 g by mouth daily.     . rivaroxaban (XARELTO) 20 MG TABS tablet Take 20 mg by mouth daily with supper.    . verapamil (CALAN-SR) 120 MG CR tablet Take 120 mg by mouth at bedtime.    . vitamin C (ASCORBIC ACID) 500 MG tablet Take 500 mg by mouth daily.    Alveda Reasons 20 MG TABS tablet TAKE 1 TABLET DAILY 90 tablet 3   No current facility-administered medications on file prior to visit.      Review of Systems    Review of Systems  Constitutional: Negative for fever, appetite change,  and unexpected weight change. pos for fatigue and malaise  Eyes: Negative for pain and visual disturbance.  Respiratory: Negative for cough and shortness of breath.   Cardiovascular: Negative for cp or palpitations    Gastrointestinal: Negative for nausea, diarrhea and constipation.  Genitourinary: pos  for urgency and frequency. neg for hematuria  Skin: Negative for pallor or rash   Neurological: Negative for weakness, light-headedness, numbness and headaches.  Hematological: Negative for adenopathy. Does not bruise/bleed easily.  Psychiatric/Behavioral: Negative for dysphoric mood. The patient is not nervous/anxious.      Objective:   Physical Exam  Constitutional: She appears well-developed and well-nourished. No distress.  obese and well appearing   HENT:  Head: Normocephalic and atraumatic.  Eyes: Conjunctivae and EOM are normal. Pupils are equal, round, and reactive to light.  Neck: Normal range of motion. Neck supple.  Cardiovascular: Normal rate and regular rhythm.   Pulmonary/Chest: Effort normal and breath sounds normal. No respiratory distress. She has no wheezes. She has no rales.  Abdominal: Soft. Bowel  sounds are normal. She exhibits no distension and no mass. There is no tenderness. There is no rebound and no guarding.  No suprapubic tenderness or fullness   No cva tenderness   Lymphadenopathy:    She has no cervical adenopathy.  Neurological: She is alert. She has normal reflexes.  Skin: Skin is warm and dry. No rash noted.  Psychiatric: She has a normal mood and affect.          Assessment & Plan:   Problem List Items Addressed This Visit      Genitourinary   UTI (urinary tract infection) - Primary    tx with cipro  Enc fluids Sent for cx Update if not starting to improve in a week or if worsening      Relevant Orders      Urine culture (Completed)     Other   URINARY FREQUENCY, CHRONIC    Pos ua today tx with cipro and sent for cx     Relevant Orders      POCT urinalysis dipstick (Completed)

## 2014-10-13 NOTE — Telephone Encounter (Signed)
Patient notified.  Nothing further needed. 

## 2014-10-13 NOTE — Progress Notes (Signed)
Pre visit review using our clinic review tool, if applicable. No additional management support is needed unless otherwise documented below in the visit note. 

## 2014-10-13 NOTE — Assessment & Plan Note (Signed)
tx with cipro  Enc fluids Sent for cx Update if not starting to improve in a week or if worsening

## 2014-10-15 LAB — URINE CULTURE

## 2014-10-16 ENCOUNTER — Telehealth (HOSPITAL_COMMUNITY): Payer: Self-pay | Admitting: Vascular Surgery

## 2014-10-16 NOTE — Telephone Encounter (Signed)
Left message on Dr Bensimhon's office number to call me regarding this anti-coag. Clearance.

## 2014-10-16 NOTE — Telephone Encounter (Signed)
Will address at [place] 12/22

## 2014-10-16 NOTE — Telephone Encounter (Signed)
Pattie from Dr. Nicole Kindred office called about a anticoagulant clearance for this pt.. Clearance was faxed and sent in epic.Marland Kitchen Pt procedure 10/24/14.Marland Kitchen Please advise

## 2014-10-17 ENCOUNTER — Ambulatory Visit (HOSPITAL_COMMUNITY)
Admission: RE | Admit: 2014-10-17 | Discharge: 2014-10-17 | Disposition: A | Discharge status: Home / Self Care | Payer: Medicare Other | Source: Ambulatory Visit | Attending: Internal Medicine | Admitting: Internal Medicine

## 2014-10-17 VITALS — BP 122/54 | HR 63 | Wt 206.0 lb

## 2014-10-17 DIAGNOSIS — Z880 Allergy status to penicillin: Secondary | ICD-10-CM | POA: Diagnosis not present

## 2014-10-17 DIAGNOSIS — I10 Essential (primary) hypertension: Secondary | ICD-10-CM | POA: Diagnosis not present

## 2014-10-17 DIAGNOSIS — Z86711 Personal history of pulmonary embolism: Secondary | ICD-10-CM | POA: Diagnosis not present

## 2014-10-17 DIAGNOSIS — I272 Other secondary pulmonary hypertension: Secondary | ICD-10-CM | POA: Insufficient documentation

## 2014-10-17 DIAGNOSIS — Z888 Allergy status to other drugs, medicaments and biological substances status: Secondary | ICD-10-CM | POA: Insufficient documentation

## 2014-10-17 DIAGNOSIS — Z91048 Other nonmedicinal substance allergy status: Secondary | ICD-10-CM | POA: Diagnosis not present

## 2014-10-17 DIAGNOSIS — I2699 Other pulmonary embolism without acute cor pulmonale: Secondary | ICD-10-CM

## 2014-10-17 DIAGNOSIS — G4733 Obstructive sleep apnea (adult) (pediatric): Secondary | ICD-10-CM | POA: Diagnosis not present

## 2014-10-17 DIAGNOSIS — Z96652 Presence of left artificial knee joint: Secondary | ICD-10-CM | POA: Diagnosis not present

## 2014-10-17 DIAGNOSIS — Z9109 Other allergy status, other than to drugs and biological substances: Secondary | ICD-10-CM | POA: Insufficient documentation

## 2014-10-17 DIAGNOSIS — E785 Hyperlipidemia, unspecified: Secondary | ICD-10-CM | POA: Diagnosis not present

## 2014-10-17 DIAGNOSIS — Z79899 Other long term (current) drug therapy: Secondary | ICD-10-CM | POA: Diagnosis not present

## 2014-10-17 DIAGNOSIS — I270 Primary pulmonary hypertension: Secondary | ICD-10-CM

## 2014-10-17 DIAGNOSIS — Z7902 Long term (current) use of antithrombotics/antiplatelets: Secondary | ICD-10-CM | POA: Insufficient documentation

## 2014-10-17 NOTE — Progress Notes (Signed)
SATURATION QUALIFICATIONS: (This note is used to comply with regulatory documentation for home oxygen)  Patient Saturations on Room Air at Rest = 93%  Patient Saturations on Room Air while Ambulating = 86%  Patient Saturations on 2 Liters of oxygen while Ambulating = 95%  Please briefly explain why patient needs home oxygen: Desats with ambulation, pulmonary hypertension

## 2014-10-17 NOTE — Addendum Note (Signed)
Encounter addended by: Scarlette Calico, RN on: 10/17/2014 11:51 AM<BR>     Documentation filed: Notes Section, Patient Instructions Section

## 2014-10-17 NOTE — Addendum Note (Signed)
Encounter addended by: Scarlette Calico, RN on: 10/17/2014  3:26 PM<BR>     Documentation filed: Dx Association, Orders

## 2014-10-17 NOTE — Addendum Note (Signed)
Encounter addended by: Scarlette Calico, RN on: 10/17/2014 11:55 AM<BR>     Documentation filed: Medications, Orders

## 2014-10-17 NOTE — Progress Notes (Signed)
Patient ID: [name], female   DOB: May 18, 1934, 80 y.o.   MRN: [id number]  PCP:  [name], MD EP: Dr Johney Frame   HPI: [name] is a 80 y/o woman referred by Dr. Johney Frame for further evaluation of pulmonary HTN.   She has a h/o obesity, HTN, PSVT.  She had a total revision/replacement of her L knee replacement by Dr Lamar Sprinkles a couple of years ago and this was complicated by a DVT and PE. Then underwent R TKR in 5/15 without problem. She remains on Xarelto. Has never had another clot.   Had Echo 03/10/14: LVEF 55-60% with flat septum. RV moderately dilated. Moderate to severe TR. Mild HK RVSP 73 mmHg.  Sleep study was done showing severe OSA.    Follow-up: She is here for f/u. She has seen Dr. Vassie Loll and started on CPAP. Feels much better. Breathing better. No edema, syncope or presyncope. Not wearing O2 with exertion. Limited mainly by knee pain.   RHC (9/15) RA mean 6 PA 43/12, mean 25 PCWP mean 16 PVR 1.6 WU CI 2.9  PFTs (05/25/14)   FEV1  1.88 L (88%) FVC    2.22 L (77%) FEV1/F VC 114% DLCO 68%    CT chest 5/15: no PE. No ILD. + RV strain and coronary calcifications . Esophagus normal VQ 05/30/14: Low prob   Labs:  8/15: HGB 12.7, ANA negative, anti SCL-70 negative, HIV negative 9/15: K 4.2, creatinine 0.77    Past Medical History  Diagnosis Date  . Osteopenia   . Hyperlipidemia   . Hypertension   . PSVT (paroxysmal supraventricular tachycardia)   . Spinal stenosis   . Overactive bladder     urge incontinence  . Pulmonary emboli   . Pulmonary hypertension   . Right ventricular failure   . Arthritis     knee  . Anemia   . Diverticulosis   . Sleep apnea     CPAP   Past Surgical History  Procedure Laterality Date  . Cataract extraction Bilateral 2008  . Bladder suspension  1993  . Total knee arthroplasty Left 2000  . Total knee revision  01/12/2012    Procedure: TOTAL KNEE REVISION;  Surgeon: [name], MD;  Location: [facility] OR;  Service: Orthopedics;  Laterality:  Left;  DEPUY/ LCS , HAND SET  . Trigger finger release  01/12/2012    Procedure: RELEASE TRIGGER FINGER/A-1 PULLEY;  Surgeon: [name], MD;  Location: [facility] OR;  Service: Orthopedics;  Laterality: Right;  . Cholecystectomy    . Incisional hernia repair    . Total knee arthroplasty Left 10/29/2012    at [facility]  . Total knee arthroplasty Right 03/09/2014  . Colonoscopy    . Right heart catheterization N/A 07/04/2014    Procedure: RIGHT HEART CATH;  Surgeon: Dolores Patty, MD;  Location: [facility] CATH LAB;  Service: Cardiovascular;  Laterality: N/A;  . Tonsillectomy and adenoidectomy      Current Outpatient Prescriptions  Medication Sig Dispense Refill  . Calcium Carb-Cholecalciferol (CALCIUM 600/VITAMIN D3) 600-800 MG-UNIT TABS Take 1 tablet by mouth 2 (two) times daily.    . ciprofloxacin (CIPRO) 250 MG tablet Take 1 tablet (250 mg total) by mouth 2 (two) times daily. 10 tablet 0  . diphenhydrAMINE (BENADRYL) 25 mg capsule Take 25 mg by mouth daily.    . metoprolol (LOPRESSOR) 50 MG tablet Take 50 mg by mouth daily.    Marland Kitchen MOVIPREP 100 G SOLR Use per prep instructions 1 kit  0  . Multiple Vitamin (MULTIVITAMIN WITH MINERALS) TABS tablet Take 1 tablet by mouth daily.    . OXYGEN Inhale 2 L/min into the lungs at bedtime.    . polyethylene glycol (MIRALAX / GLYCOLAX) packet Take 8.5 g by mouth daily.     . rivaroxaban (XARELTO) 20 MG TABS tablet Take 20 mg by mouth daily with supper.    . verapamil (CALAN-SR) 120 MG CR tablet Take 120 mg by mouth at bedtime.    . vitamin C (ASCORBIC ACID) 500 MG tablet Take 500 mg by mouth daily.    Alveda Reasons 20 MG TABS tablet TAKE 1 TABLET DAILY 90 tablet 3   No current facility-administered medications for this encounter.    Allergies  Allergen Reactions  . Penicillins Hives  . Sulfonamide Derivatives Hives  . Adhesive [Tape] Other (See Comments)    redness  . Prednisone Other (See Comments)    Causes insomnia    History   Social History  . Marital  Status: Widowed    Spouse Name: N/A    Number of Children: 3  . Years of Education: N/A   Occupational History  . Retired   .     Social History Main Topics  . Smoking status: Never Smoker   . Smokeless tobacco: Never Used  . Alcohol Use: No  . Drug Use: No  . Sexual Activity: No   Other Topics Concern  . Not on file   Social History Narrative   Pt lives alone but dtr lives on property and has been staying with her since surgery.    Family History  Problem Relation Age of Onset  . Breast cancer Mother   . Heart disease Father   . Diabetes Father   . Hypertension Father   . Uterine cancer Sister     Physical Exam: Filed Vitals:   10/17/14 1101  BP: 122/54  Pulse: 63  Weight: 206 lb (93.441 kg)  SpO2: 96%   GEN- The patient is elderly appearing, alert and oriented x 3 today.   Head- normocephalic, atraumatic Eyes-  Sclera clear, conjunctiva pink Ears- hearing intact Oropharynx- clear Neck- supple, JVP 5 cm Lungs- Clear to ausculation bilaterally, normal work of breathing Heart- Regular rate and rhythm, 2/6 TR, no rubs or gallops, PMI not laterally displaced GI- obese. soft, NT, ND, + BS Extremities- bilateral TKR on left, no edema. Neuro- strength and sensation are intact   Assessment and Plan: 1. Pulmonary HTN: Mild on RHC with PAP mean 25 and PVR only 1.6 WU, suspect over-estimated on echo.  Source of pulmonary hypertension is likely severe OSA +/- OHS.  She did not have significant disease on CT chest or V/Q scan and PFTs were not markedly abnormal.   - She is improved with CPAP - She is not wearing oxygen with exertion despite previous drop to 85% on ambulation. Will recheck ambulatory sats today. - Would refer to pulmonary rehab 2. H/o scleroderma but no physical findings to support and anti SCL-70 antibody negative.  3. Remote PE with normal f/u CT and VQ scan - this was clearly in setting of TKR. I think we can stop Xarelto. She is aware of small risk  of recurrence.   Can f/u with Dr. Lamonte Sakai. Consider repeat echo in 6 months. I am happy to see back as needed.   [name] 10/17/2014

## 2014-10-17 NOTE — Patient Instructions (Signed)
Stop Xarelto  You have been referred to Pulmonary Rehab, they will contact you to schedule  Please wear your oxygen with exertion   Follow up as needed

## 2014-10-17 NOTE — Telephone Encounter (Signed)
See note from today, Xarelto stopped per Dr Haroldine Laws

## 2014-10-17 NOTE — Telephone Encounter (Signed)
Spoke with patient and told her we saw in the system where Dr Haroldine Laws stopped her xarelto today so we no longer need clearance to hold it for her upcoming procedure.

## 2014-10-24 ENCOUNTER — Encounter (HOSPITAL_COMMUNITY)
Admission: RE | Disposition: A | Discharge status: Home / Self Care | Payer: Self-pay | Source: Ambulatory Visit | Attending: Internal Medicine

## 2014-10-24 ENCOUNTER — Encounter (HOSPITAL_COMMUNITY): Payer: Self-pay | Admitting: Gastroenterology

## 2014-10-24 ENCOUNTER — Ambulatory Visit (HOSPITAL_COMMUNITY)
Admission: RE | Admit: 2014-10-24 | Discharge: 2014-10-24 | Disposition: A | Discharge status: Home / Self Care | Payer: Medicare Other | Source: Ambulatory Visit | Attending: Internal Medicine | Admitting: Internal Medicine

## 2014-10-24 DIAGNOSIS — E785 Hyperlipidemia, unspecified: Secondary | ICD-10-CM | POA: Diagnosis not present

## 2014-10-24 DIAGNOSIS — K635 Polyp of colon: Secondary | ICD-10-CM | POA: Diagnosis not present

## 2014-10-24 DIAGNOSIS — Z7901 Long term (current) use of anticoagulants: Secondary | ICD-10-CM | POA: Diagnosis not present

## 2014-10-24 DIAGNOSIS — D123 Benign neoplasm of transverse colon: Secondary | ICD-10-CM

## 2014-10-24 DIAGNOSIS — I10 Essential (primary) hypertension: Secondary | ICD-10-CM | POA: Insufficient documentation

## 2014-10-24 DIAGNOSIS — R195 Other fecal abnormalities: Secondary | ICD-10-CM | POA: Insufficient documentation

## 2014-10-24 DIAGNOSIS — I272 Other secondary pulmonary hypertension: Secondary | ICD-10-CM | POA: Diagnosis not present

## 2014-10-24 DIAGNOSIS — K921 Melena: Secondary | ICD-10-CM | POA: Diagnosis present

## 2014-10-24 DIAGNOSIS — Z860100 Personal history of colon polyps, unspecified: Secondary | ICD-10-CM

## 2014-10-24 DIAGNOSIS — Z86711 Personal history of pulmonary embolism: Secondary | ICD-10-CM | POA: Diagnosis not present

## 2014-10-24 DIAGNOSIS — K5730 Diverticulosis of large intestine without perforation or abscess without bleeding: Secondary | ICD-10-CM | POA: Insufficient documentation

## 2014-10-24 HISTORY — DX: Personal history of colonic polyps: Z86.010

## 2014-10-24 HISTORY — PX: COLONOSCOPY: SHX5424

## 2014-10-24 HISTORY — DX: Personal history of colon polyps, unspecified: Z86.0100

## 2014-10-24 SURGERY — COLONOSCOPY
Anesthesia: Moderate Sedation

## 2014-10-24 MED ORDER — FENTANYL CITRATE 0.05 MG/ML IJ SOLN
INTRAMUSCULAR | Status: AC
  Filled 2014-10-24: qty 2

## 2014-10-24 MED ORDER — SODIUM CHLORIDE 0.9 % IV SOLN
INTRAVENOUS | Status: DC
  Administered 2014-10-24: 500 mL via INTRAVENOUS

## 2014-10-24 MED ORDER — MIDAZOLAM HCL 10 MG/2ML IJ SOLN
INTRAMUSCULAR | Status: AC
  Filled 2014-10-24: qty 2

## 2014-10-24 MED ORDER — MIDAZOLAM HCL 5 MG/5ML IJ SOLN
INTRAMUSCULAR | Status: DC | PRN
  Administered 2014-10-24: 2 mg via INTRAVENOUS
  Administered 2014-10-24: 1 mg via INTRAVENOUS
  Administered 2014-10-24: 2 mg via INTRAVENOUS

## 2014-10-24 MED ORDER — FENTANYL CITRATE 0.05 MG/ML IJ SOLN
INTRAMUSCULAR | Status: DC | PRN
  Administered 2014-10-24 (×3): 25 ug via INTRAVENOUS

## 2014-10-24 NOTE — Interval H&P Note (Signed)
History and Physical Interval Note:  10/24/2014 8:36 AM  [name]  has presented today for surgery, with the diagnosis of Heme positive stool  The various methods of treatment have been discussed with the patient and family. After consideration of risks, benefits and other options for treatment, the patient has consented to  Procedure(s): COLONOSCOPY (N/A) as a surgical intervention .  The patient's history has been reviewed, patient examined, no change in status, stable for surgery.  I have reviewed the patient's chart and labs.  Questions were answered to the patient's satisfaction.     Silvano Rusk

## 2014-10-24 NOTE — H&P (View-Only) (Signed)
Referred by Dr. [name] Subjective:    Patient ID: [name], female    DOB: [birth year], 80 y.o.   MRN: [id number]  HPI Is a very nice 80 year old woman with pulmonary hypertension a remote history of a perioperative DVT and pulmonary embolism, on Xarelto with a heme positive stool. She had a positive immune based fecal occult blood test earlier this year. She denies any GI symptoms at all. No rectal bleeding or melena. She does use a stool softener however which keeps her regular with respect to defecation. He exercises at the [facility] 3 times a week. She feels like she living life well and actively. Allergies  Allergen Reactions  . Penicillins Hives  . Sulfonamide Derivatives Hives  . Adhesive [Tape] Other (See Comments)    redness  . Prednisone Other (See Comments)    Causes insomnia   Outpatient Prescriptions Prior to Visit  Medication Sig Dispense Refill  . Calcium Carb-Cholecalciferol (CALCIUM 600/VITAMIN D3) 600-800 MG-UNIT TABS Take 1 tablet by mouth 2 (two) times daily.    . diphenhydrAMINE (BENADRYL) 25 mg capsule Take 25 mg by mouth daily.    . metoprolol (LOPRESSOR) 50 MG tablet Take 50 mg by mouth daily.    . Multiple Vitamin (MULTIVITAMIN WITH MINERALS) TABS tablet Take 1 tablet by mouth daily.    . polyethylene glycol (MIRALAX / GLYCOLAX) packet Take 8.5 g by mouth daily.     . rivaroxaban (XARELTO) 20 MG TABS tablet Take 20 mg by mouth daily with supper.    . verapamil (CALAN-SR) 120 MG CR tablet Take 120 mg by mouth at bedtime.    . vitamin C (ASCORBIC ACID) 500 MG tablet Take 500 mg by mouth daily.    Marland Kitchen Spacer/Aero-Holding Chambers (AEROCHAMBER MV) inhaler Use as instructed 1 each 0   No facility-administered medications prior to visit.   Past Medical History  Diagnosis Date  . Osteopenia   . Hyperlipidemia   . Hypertension   . PSVT (paroxysmal supraventricular tachycardia)   . Spinal stenosis   . Overactive bladder     urge incontinence  . Pulmonary emboli     . Pulmonary hypertension   . Right ventricular failure   . Arthritis     knee  . Anemia   . Diverticulosis   . Sleep apnea     CPAP   Past Surgical History  Procedure Laterality Date  . Cataract extraction Bilateral 2008  . Bladder suspension  1993  . Total knee arthroplasty Left 2000  . Total knee revision  01/12/2012    Procedure: TOTAL KNEE REVISION;  Surgeon: Kerin Salen, MD;  Location: [place];  Service: Orthopedics;  Laterality: Left;  DEPUY/ LCS , HAND SET  . Trigger finger release  01/12/2012    Procedure: RELEASE TRIGGER FINGER/A-1 PULLEY;  Surgeon: Kerin Salen, MD;  Location: [place];  Service: Orthopedics;  Laterality: Right;  . Cholecystectomy    . Incisional hernia repair    . Total knee arthroplasty Left 10/29/2012    at [facility]  . Total knee arthroplasty Right 03/09/2014  . Colonoscopy    . Right heart catheterization N/A 07/04/2014    Procedure: RIGHT HEART CATH;  Surgeon: Jolaine Artist, MD;  Location: [facility] CATH LAB;  Service: Cardiovascular;  Laterality: N/A;  . Tonsillectomy and adenoidectomy     History   Social History  . Marital Status: Widowed    Spouse Name: N/A    Number of Children: 3  . Years of Education:  N/A   Occupational History  . Retired   .     Social History Main Topics  . Smoking status: Never Smoker   . Smokeless tobacco: Never Used  . Alcohol Use: No  . Drug Use: No  . Sexual Activity: No   Other Topics Concern  . None   Social History Narrative   Pt lives alone but dtr lives on property and has been staying with her since surgery.   Family History  Problem Relation Age of Onset  . Breast cancer Mother   . Heart disease Father   . Diabetes Father   . Hypertension Father   . Uterine cancer Sister         Review of Systems Ambulates with a cane, wears CPAP at night with O2  Wears eyeglasses    Objective:   Physical Exam General:  Well-developed, well-nourished and in no acute distress Eyes:  anicteric. Neck:    supple w/o thyromegaly or mass.  Lungs: Clear to auscultation bilaterally. Heart:  S1S2, no rubs, murmurs, gallops. Abdomen:  soft, non-tender, no hepatosplenomegaly, hernia, or mass and BS+.  Rectal: This is deferred until colonoscopy Lymph:  no cervical or supraclavicular adenopathy. Extremities:   no edema Skin   no rash. Neuro:  A&O x 3.  Psych:  appropriate mood and  Affect.   Data Reviewed: Labs in EMR. She is not anemic. I reviewed cardiology and pulmonary notes from the last several months.    Assessment & Plan:   1. Heme + stool   2. Long term current use of anticoagulant therapy - Xarelto    1. We discussed the risks benefits and indications of colonoscopy for heme positive stool. She does have comorbidities but is still very functional. We have decided to proceed with colonoscopy. This will be scheduled at the hospital at the end of December. 2. Will query Dr. Haroldine Laws regarding temporary holding of Xarelto prior to and after colonoscopy. 3. She takes verapamil, and she is on MiraLAX regularly so we'll use MoviPrep or SuPrep purge for colonoscopy

## 2014-10-24 NOTE — Discharge Instructions (Signed)
° °  I found and removed 2 small polyps and saw diverticulosis. Neither of these are significant issues. Cause of bleeding not entirely clear - could have been polyps. You were on Xarelto then so easy to bleed a little.  I will let you know pathology results and if to have another routine colonoscopy by mail. I doubt you should need another one - an advantage of being 37!  I appreciate the opportunity to care for you. Gatha Mayer, MD, FACG   YOU HAD AN ENDOSCOPIC PROCEDURE TODAY: Refer to the procedure report and other information in the discharge instructions given to you for any specific questions about what was found during the examination. If this information does not answer your questions, please call Dr. Celesta Aver office at [phone] to clarify.   YOU SHOULD EXPECT: Some feelings of bloating in the abdomen. Passage of more gas than usual. Walking can help get rid of the air that was put into your GI tract during the procedure and reduce the bloating. If you had a lower endoscopy (such as a colonoscopy or flexible sigmoidoscopy) you may notice spotting of blood in your stool or on the toilet paper. Some abdominal soreness may be present for a day or two, also.  DIET: Your first meal following the procedure should be a light meal and then it is ok to progress to your normal diet. A half-sandwich or bowl of soup is an example of a good first meal. Heavy or fried foods are harder to digest and may make you feel nauseous or bloated. Drink plenty of fluids but you should avoid alcoholic beverages for 24 hours.   ACTIVITY: Your care partner should take you home directly after the procedure. You should plan to take it easy, moving slowly for the rest of the day. You can resume normal activity the day after the procedure however YOU SHOULD NOT DRIVE, use power tools, machinery or perform tasks that involve climbing or major physical exertion for 24 hours (because of the sedation medicines used  during the test).   SYMPTOMS TO REPORT IMMEDIATELY: A gastroenterologist can be reached at any hour. Please call [phone]  for any of the following symptoms:  Following lower endoscopy (colonoscopy, flexible sigmoidoscopy) Excessive amounts of blood in the stool  Significant tenderness, worsening of abdominal pains  Swelling of the abdomen that is new, acute  Fever of 100 or higher   FOLLOW UP:  If any biopsies were taken you will be contacted by phone or by letter within the next 1-3 weeks. Call [phone]  if you have not heard about the biopsies in 3 weeks.  Please also call with any specific questions about appointments or follow up tests.

## 2014-10-24 NOTE — Op Note (Signed)
[place], [id number]   COLONOSCOPY PROCEDURE REPORT  PATIENT: [name], [name]  MR#: [id number] BIRTHDATE: 02/06/1934 , 80  yrs. old GENDER: female ENDOSCOPIST: Gatha Mayer, MD, [facility] PROCEDURE DATE:  10/24/2014 PROCEDURE:   Colonoscopy with snare polypectomy  ASA CLASS:   Class III INDICATIONS:heme-positive stool. MEDICATIONS: Fentanyl 75 mcg IV and Versed 5 mg IV  DESCRIPTION OF PROCEDURE:   After the risks benefits and alternatives of the procedure were thoroughly explained, informed consent was obtained.  The digital rectal exam revealed no abnormalities of the rectum.   The Pentax Ped Colon [id number] endoscope was introduced through the anus and advanced to the cecum, which was identified by both the appendix and ileocecal valve. No adverse events experienced.   The quality of the prep was good, using MoviPrep  The instrument was then slowly withdrawn as the colon was fully examined.      COLON FINDINGS: 1) 2 - 7 mm sessile polyps removed completely from transverse colon by cold snare and sent to pathology 2) Diverticulosis left > right but in entire colon 3) redundant colon, turned to back and splenic pressure used 4) otherwise normal colonoscopy, good prep.  Retroflexed views revealed no abnormalities. The time to cecum=11 minutes 0 seconds. Withdrawal time=15 minutes 0 seconds.  The scope was withdrawn and the procedure completed. COMPLICATIONS: There were no immediate complications.  ENDOSCOPIC IMPRESSION: 1) 2 - 7 mm sessile polyps removed completely from transverse colon by cold snare and sent to pathology 2) Diverticulosis left > right but in entire colon 3) redundant colon, turned to back and splenic pressure used 4) otherwise normal colonoscopy, good prep  RECOMMENDATIONS: Await pathology - do not anticipate she will need routine repeat colonscopy at her age  eSigned:  Gatha Mayer, MD, [facility] 10/24/2014 9:39  AM   cc: The Patient and Dr. Loura Pardon

## 2014-10-25 ENCOUNTER — Encounter (HOSPITAL_COMMUNITY): Payer: Self-pay | Admitting: Internal Medicine

## 2014-10-30 ENCOUNTER — Encounter: Payer: Self-pay | Admitting: Internal Medicine

## 2014-10-30 NOTE — Progress Notes (Signed)
Quick Note:  ssp and hyperplastic polyp No routine repeat colonoscopy due to age ______

## 2014-11-26 ENCOUNTER — Other Ambulatory Visit: Payer: Self-pay | Admitting: Internal Medicine

## 2014-11-27 ENCOUNTER — Ambulatory Visit (INDEPENDENT_AMBULATORY_CARE_PROVIDER_SITE_OTHER): Payer: Medicare Other | Admitting: Family Medicine

## 2014-11-27 ENCOUNTER — Encounter: Payer: Self-pay | Admitting: Family Medicine

## 2014-11-27 VITALS — BP 134/72 | HR 60 | Temp 97.5°F | Ht 66.0 in | Wt 205.8 lb

## 2014-11-27 DIAGNOSIS — K429 Umbilical hernia without obstruction or gangrene: Secondary | ICD-10-CM | POA: Insufficient documentation

## 2014-11-27 NOTE — Patient Instructions (Signed)
I think you have a small umbilical hernia  Unless it gets larger or hurts (especially with straining)-we will just observe it  Let me know if larger or painful or problems with straining

## 2014-11-27 NOTE — Progress Notes (Signed)
Pre visit review using our clinic review tool, if applicable. No additional management support is needed unless otherwise documented below in the visit note. 

## 2014-11-27 NOTE — Assessment & Plan Note (Signed)
Small /nontender and reducible  Disc nature of hernia -and plan to watch closely  Would consider repair if it enlarges or becomes painful Disc poss of strangulation - will alert is if not reducible   Will be careful about straining (pt has been going to the gym)

## 2014-11-27 NOTE — Progress Notes (Signed)
Subjective:    Patient ID: [name], female    DOB: Mar 12, 1934, 81 y.o.   MRN: [id number]  HPI Here with a knot in her abdomen  Close to navel - has had incision there  Wonders about a hernia Does not hurt   Noticed it last night  It seems to get larger when she coughs   Patient Active Problem List   Diagnosis Date Noted  . Hx of colonic polyps 10/24/2014  . Heme + stool   . Diverticulosis of colon without hemorrhage   . Pulmonary HTN 10/17/2014  . UTI (urinary tract infection) 10/13/2014  . Occult blood positive stool 07/30/2014  . Dyspnea 07/26/2014  . OSA (obstructive sleep apnea) 07/22/2014  . Unspecified arthropathy, lower leg 03/21/2014  . History of right knee joint replacement 03/15/2014  . Preop cardiovascular exam 02/23/2014  . Solitary pulmonary nodule 05/03/2013  . Hypoxia 04/26/2013  . Sternal fracture 04/26/2013  . Encounter for Medicare annual wellness exam 01/26/2013  . Right ventricular failure 12/09/2012  . PE (pulmonary embolism) 11/24/2012  . DVT (deep venous thrombosis) 11/24/2012  . Pulmonary hypertension, moderate to severe 11/24/2012  . PES PLANUS 08/15/2010  . URINARY FREQUENCY, CHRONIC 07/14/2008  . HYPERCHOLESTEROLEMIA, PURE 07/09/2007  . HYPERTENSION, ESSENTIAL NOS 07/09/2007  . PSVT 02/23/2007  . SCLERODERMA 02/23/2007  . DEGENERATIVE JOINT DISEASE 02/23/2007  . SPINAL STENOSIS, LUMBAR 02/23/2007  . OSTEOPENIA 02/23/2007  . SPONDYLOLISTHESIS 02/23/2007   Past Medical History  Diagnosis Date  . Osteopenia   . Hyperlipidemia   . Hypertension   . PSVT (paroxysmal supraventricular tachycardia)   . Spinal stenosis   . Overactive bladder     urge incontinence  . Pulmonary emboli   . Pulmonary hypertension   . Right ventricular failure   . Arthritis     knee  . Anemia   . Diverticulosis   . Sleep apnea     CPAP  . Hx of colonic polyps 10/24/2014   Past Surgical History  Procedure Laterality Date  . Cataract extraction  Bilateral 2008  . Bladder suspension  1993  . Total knee arthroplasty Left 2000  . Total knee revision  01/12/2012    Procedure: TOTAL KNEE REVISION;  Surgeon: Kerin Salen, MD;  Location: [facility];  Service: Orthopedics;  Laterality: Left;  DEPUY/ LCS , HAND SET  . Trigger finger release  01/12/2012    Procedure: RELEASE TRIGGER FINGER/A-1 PULLEY;  Surgeon: Kerin Salen, MD;  Location: [facility];  Service: Orthopedics;  Laterality: Right;  . Cholecystectomy    . Incisional hernia repair    . Total knee arthroplasty Left 10/29/2012    at [facility]  . Total knee arthroplasty Right 03/09/2014  . Colonoscopy    . Right heart catheterization N/A 07/04/2014    Procedure: RIGHT HEART CATH;  Surgeon: Jolaine Artist, MD;  Location: [facility] CATH LAB;  Service: Cardiovascular;  Laterality: N/A;  . Tonsillectomy and adenoidectomy    . Colonoscopy N/A 10/24/2014    Procedure: COLONOSCOPY;  Surgeon: Gatha Mayer, MD;  Location: [facility] ENDOSCOPY;  Service: Endoscopy;  Laterality: N/A;   History  Substance Use Topics  . Smoking status: Never Smoker   . Smokeless tobacco: Never Used  . Alcohol Use: No   Family History  Problem Relation Age of Onset  . Breast cancer Mother   . Heart disease Father   . Diabetes Father   . Hypertension Father   . Uterine cancer Sister    Allergies  Allergen Reactions  . Penicillins Hives  . Sulfonamide Derivatives Hives  . Adhesive [Tape] Other (See Comments)    redness  . Prednisone Other (See Comments)    Causes insomnia   Current Outpatient Prescriptions on File Prior to Visit  Medication Sig Dispense Refill  . Calcium Carb-Cholecalciferol (CALCIUM 600/VITAMIN D3) 600-800 MG-UNIT TABS Take 1 tablet by mouth 2 (two) times daily.    . diphenhydrAMINE (BENADRYL) 25 mg capsule Take 25 mg by mouth daily.    . metoprolol (LOPRESSOR) 50 MG tablet Take 50 mg by mouth daily.    . Multiple Vitamin (MULTIVITAMIN WITH MINERALS) TABS tablet Take 1 tablet by mouth daily.    . OXYGEN  Inhale 2 L/min into the lungs at bedtime.    . polyethylene glycol (MIRALAX / GLYCOLAX) packet Take 8.5 g by mouth daily.     . verapamil (CALAN-SR) 120 MG CR tablet TAKE 1 TABLET AT BEDTIME 90 tablet 0  . vitamin C (ASCORBIC ACID) 500 MG tablet Take 500 mg by mouth daily.     No current facility-administered medications on file prior to visit.    Review of Systems Review of Systems  Constitutional: Negative for fever, appetite change, and unexpected weight change.  Eyes: Negative for pain and visual disturbance.  Respiratory: Negative for wheeze and shortness of breath.  pos for recent semi prod cough Cardiovascular: Negative for cp or palpitations    Gastrointestinal: Negative for nausea, diarrhea and constipation.  Genitourinary: Negative for urgency and frequency.  Skin: Negative for pallor or rash   MSK pos for leg weakness that is improving  Neurological: Negative for weakness, light-headedness, numbness and headaches.  Hematological: Negative for adenopathy. Does not bruise/bleed easily.  Psychiatric/Behavioral: Negative for dysphoric mood. The patient is not nervous/anxious.         Objective:   Physical Exam  Constitutional: She appears well-developed and well-nourished. No distress.  overwt and well appearing    HENT:  Head: Normocephalic and atraumatic.  Mouth/Throat: Oropharynx is clear and moist.  Eyes: Conjunctivae and EOM are normal. Pupils are equal, round, and reactive to light. No scleral icterus.  Neck: Normal range of motion. Neck supple. Carotid bruit is not present.  Cardiovascular: Normal rate, regular rhythm, normal heart sounds and intact distal pulses.  Exam reveals no gallop.   Pulmonary/Chest: Effort normal and breath sounds normal. No respiratory distress. She has no wheezes. She has no rales.  No crackles or rales  occ dry cough  Abdominal: Soft. Bowel sounds are normal. She exhibits no distension. There is no tenderness. There is no rebound and no  guarding.  unbilical hernia noted- small /reducible and nontender   Musculoskeletal: She exhibits no edema.  Lymphadenopathy:    She has no cervical adenopathy.  Neurological: She is alert.  Skin: Skin is warm and dry. No rash noted. No erythema. No pallor.  Psychiatric: She has a normal mood and affect.          Assessment & Plan:   Problem List Items Addressed This Visit      Other   Hernia, umbilical - Primary    Small /nontender and reducible  Disc nature of hernia -and plan to watch closely  Would consider repair if it enlarges or becomes painful Disc poss of strangulation - will alert is if not reducible   Will be careful about straining (pt has been going to the gym)

## 2014-11-29 IMAGING — CT CT HEAD W/O CM
1 of 2 series · 15 of 30 positions shown, 19 images · non-contrast
Comparison: CT HEAD W/O CM dated 04/25/2010

CLINICAL DATA: General fatigue since knee surgery this past
[REDACTED].

EXAM:
CT HEAD WITHOUT CONTRAST
TECHNIQUE: Contiguous axial images were obtained from the base of the skull
through the vertex without intravenous contrast.

[Series 3: head 2.0 h70h · axial · 0.45mm/px · z∈[+1468,+1616]mm · 15 of 84 slices shown, 19 images]
[im 5/84  brain]
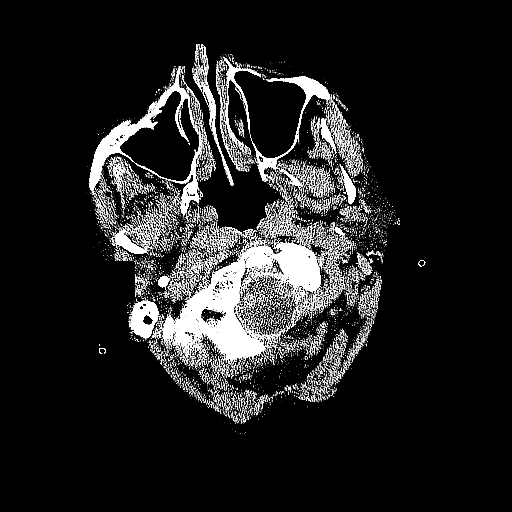
[im 5/84  bone]
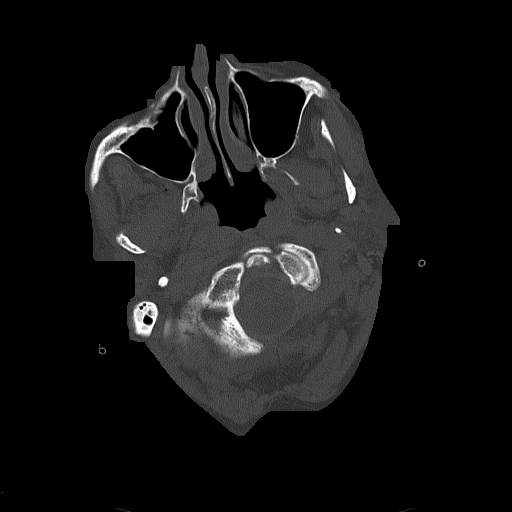
[im 9/84  brain]
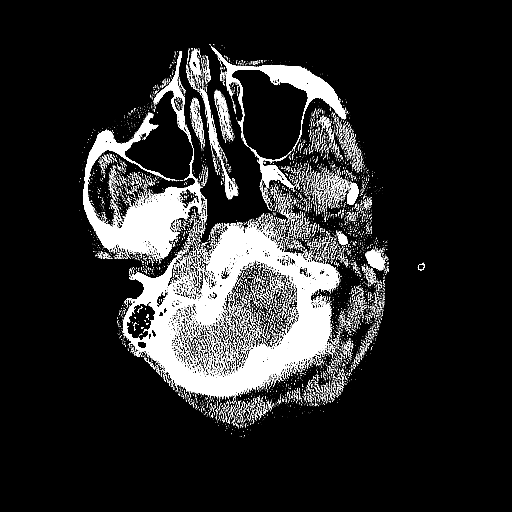
[im 17/84  brain]
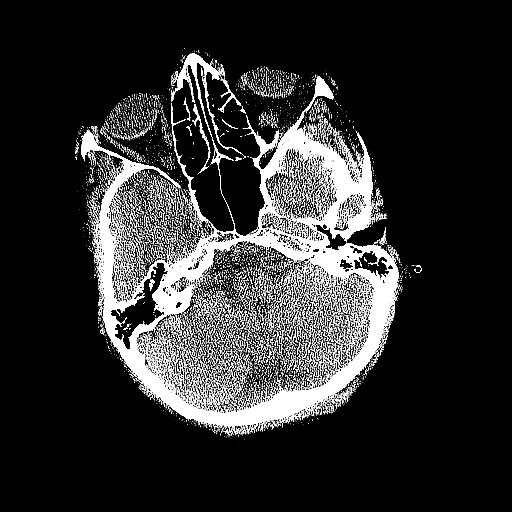
[im 21/84  brain]
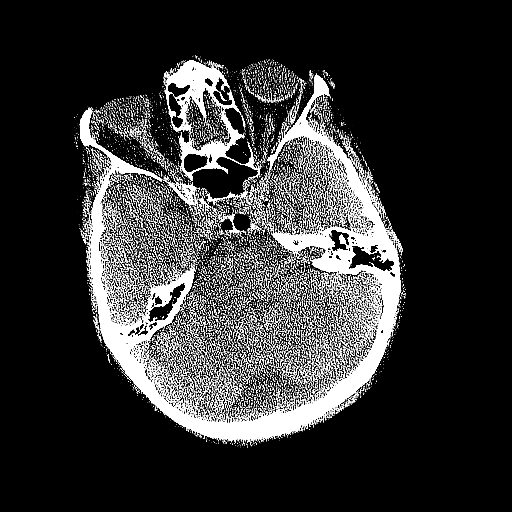
[im 25/84  brain]
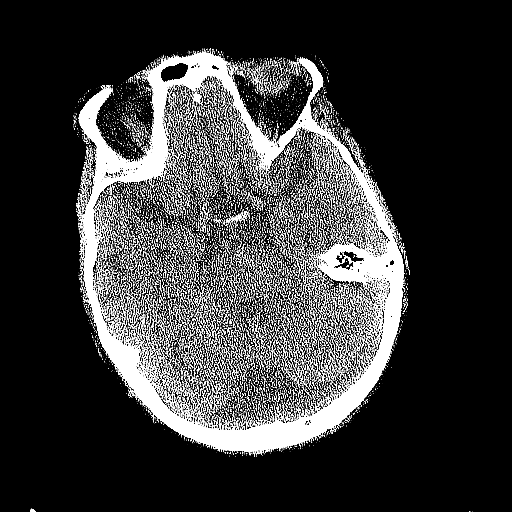
[im 25/84  bone]
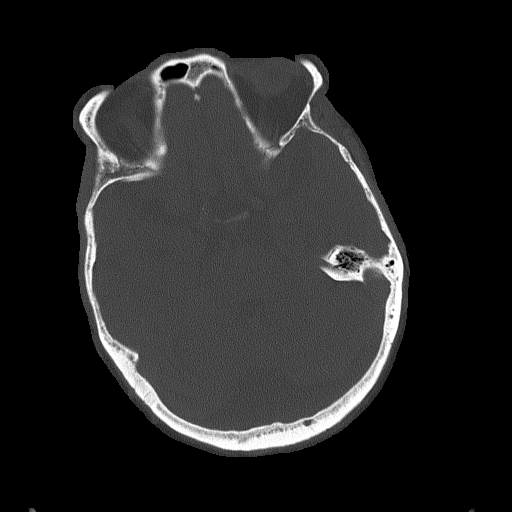
[im 30/84  brain]
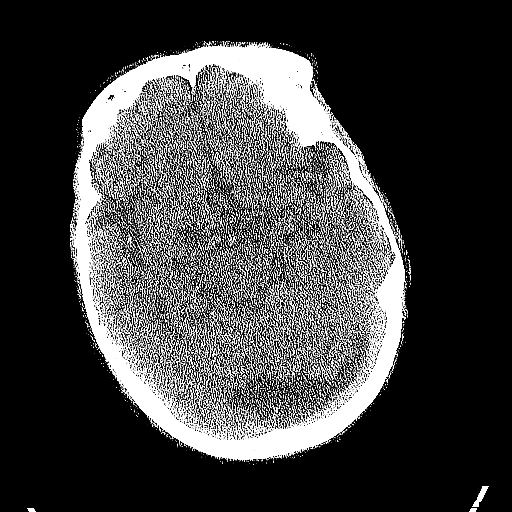
[im 38/84  brain]
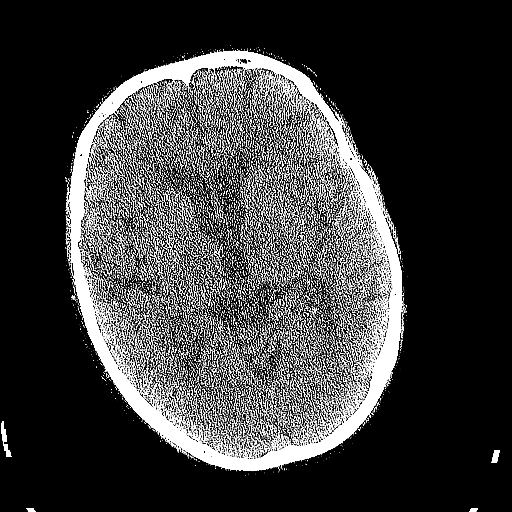
[im 42/84  brain]
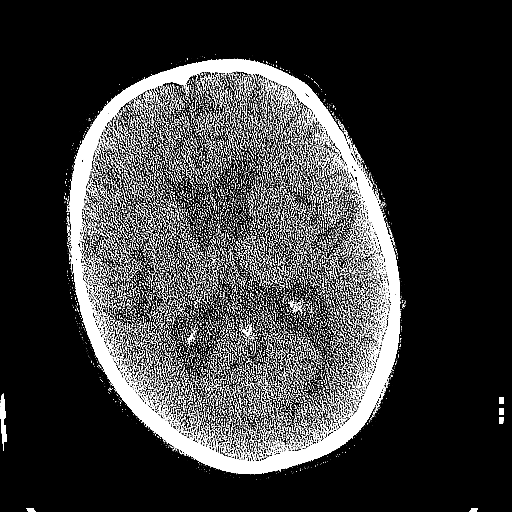
[im 46/84  brain]
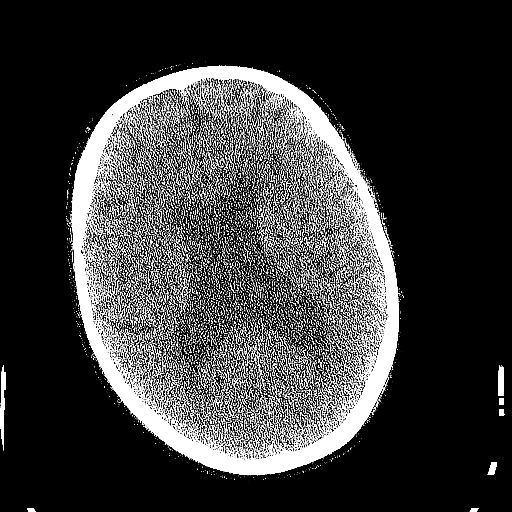
[im 46/84  bone]
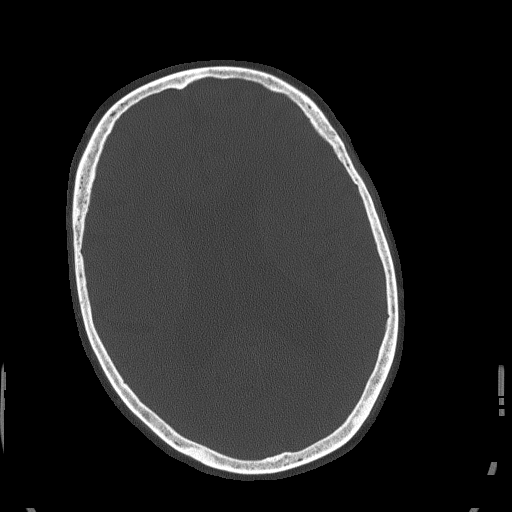
[im 54/84  brain]
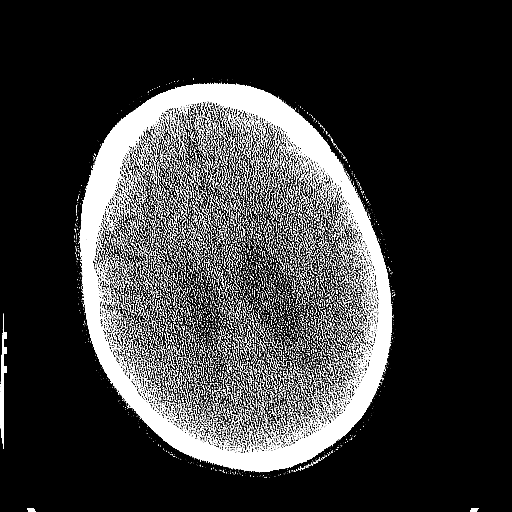
[im 59/84  brain]
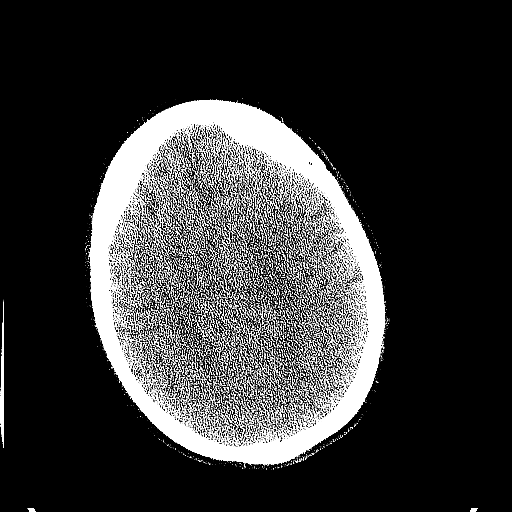
[im 63/84  brain]
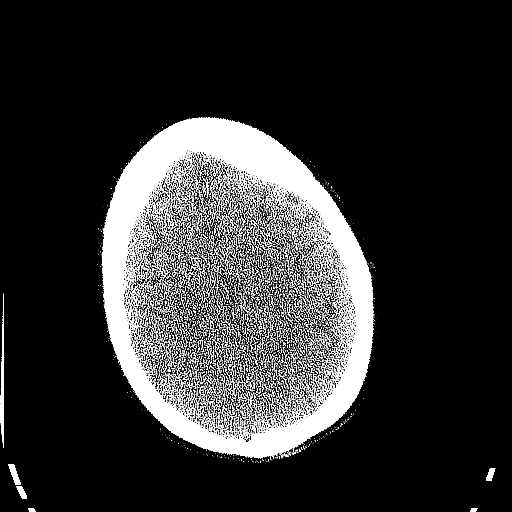
[im 67/84  brain]
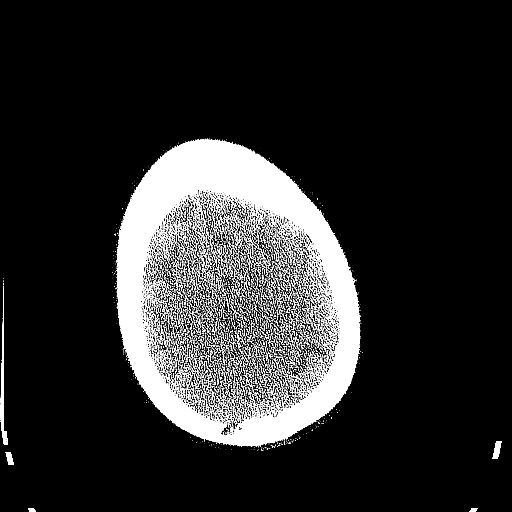
[im 67/84  bone]
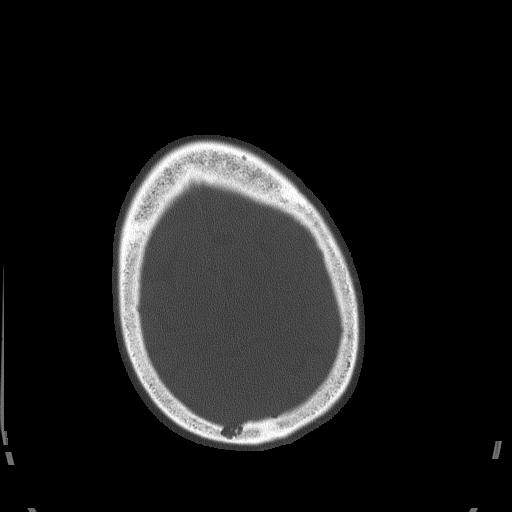
[im 75/84  brain]
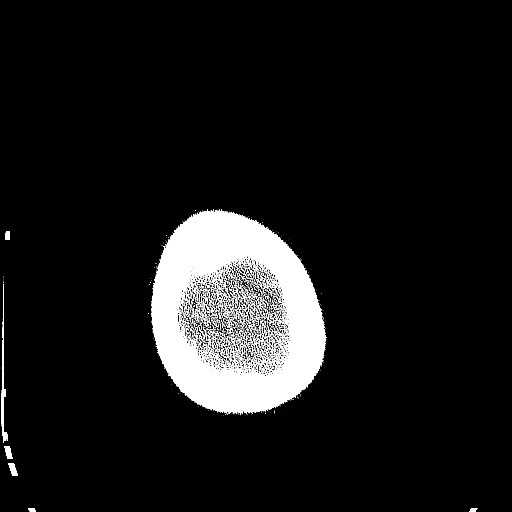
[im 79/84  brain]
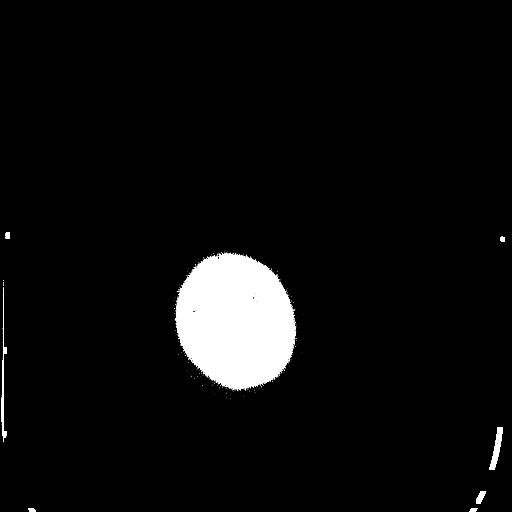

[15 of 30 positions shown; findings below may reference images not displayed]

FINDINGS: Moderate ventriculomegaly, likely on the basis of global parenchymal
brain volume loss as there is overall commensurate enlargement of
cerebral sulci and cerebellar folia. No intraparenchymal hemorrhage,
mass effect nor midline shift. Confluent supratentorial white matter
hypodensities are within normal range for patient's age and though
non-specific suggest sequelae of chronic small vessel ischemic
disease. No acute large vascular territory infarcts.

No abnormal extra-axial fluid collections. Similar posterior fossa
mega cisterna magna versus arachnoid cyst. Basal cisterns are
patent. Moderate calcific atherosclerosis of the carotid siphons.

No skull fracture. The included ocular globes and orbital contents
are non-suspicious. Small amount of air within the cavernous sinuses
suggests recent intravenous access. Status post bilateral ocular
lens implants. The mastoid aircells and included paranasal sinuses
are well-aerated. Severe temporomandibular osteoarthrosis.
IMPRESSION: No acute intracranial process.

Moderate global brain atrophy, increased from prior examination with
moderate to severe white matter changes, progressed.

  By: Klpigbb Moolman

## 2014-12-20 ENCOUNTER — Ambulatory Visit (INDEPENDENT_AMBULATORY_CARE_PROVIDER_SITE_OTHER): Payer: Medicare Other | Admitting: Pulmonary Disease

## 2014-12-20 ENCOUNTER — Encounter: Payer: Self-pay | Admitting: Pulmonary Disease

## 2014-12-20 VITALS — BP 120/82 | HR 56 | Ht 66.0 in | Wt 208.0 lb

## 2014-12-20 DIAGNOSIS — G4733 Obstructive sleep apnea (adult) (pediatric): Secondary | ICD-10-CM

## 2014-12-20 NOTE — Patient Instructions (Signed)
Get a new mask Use CPAP at least 4-6 h every night Use oxygen blended into CPAP - this is helping you

## 2014-12-20 NOTE — Assessment & Plan Note (Signed)
Get a new mask Use CPAP at least 4-6 h every night Use oxygen blended into CPAP  Weight loss encouraged, compliance with goal of at least 4-6 hrs every night is the expectation. Advised against medications with sedative side effects Cautioned against driving when sleepy - understanding that sleepiness will vary on a day to day basis

## 2014-12-20 NOTE — Progress Notes (Signed)
   Subjective:    Patient ID: [name], female    DOB: 14-Jul-1934, 81 y.o.   MRN: [id number]  HPI  81 year old woman with pulmonary hypertension, referred from CHF clinic, for FU of OSA  She had a complicated course after left knee replacement in 2013 due to a DVT/PE.  She presented with exertional dyspnea and fatigue, echo suggested severe pulmonary hypertension, right heart cath however showed only mild pulmonary hypertension with normal resistance and low wedge. Pulmonary embolism was ruled out. Sleep study showed severe OSA, hence the referral. She underwent right TKR in 03/15/14 without problems. Off xarelto  Significant tests/ events  Echo 03/10/14: LVEF 55-60% with flat septum. RV moderately dilated. Moderate to severe TR. Mild HK RVSP 73 mmHg  RHC (06/2014)  RA mean 6  PA 43/12, mean 25  PCWP mean 16  PVR 1.6 WU  CI 2.9   PFTs (05/25/14) FEV1 1.88 L (88%) FVC 2.22 L (77%) ratio 114, DLCO 68%    CT chest - no PE , mild bronchiectasis at bases, Areas of mild dependent subsegmental atelectasis throughout the lower lobes of the lungs bilaterally VQ 05/30/14: Low prob   PSG 06/2014 >> severe OSA, AHI 62 per hour, corrected by CPAP 12 cm with small fullface mask. Central apneas emergent higher pressures               Download on 12 cm 08/2014 >> AHI 3/h, >6h usage, AHI 3/h        12/20/2014  Chief Complaint  Patient presents with  . Follow-up    Wears CPAP for a few hours every night, sometimes mask gives her a headache, it mashes her face.  no concerns.   CPAP download 08/2014 - no residuals on 12 cm ONO on CPPA/RA shows 71 min desatn Add 2L O2 to CPAP ONO on CPAP/2 L O2 >> no desatn  Mask is too tight, usage on download 12 cm 11/2014 has dropped to 2h She has not changed x many months No edema, dyspnea is at baseline Feels better rested with oxygen  Review of Systems neg for any significant sore throat, dysphagia, itching, sneezing, nasal congestion  or excess/ purulent secretions, fever, chills, sweats, unintended wt loss, pleuritic or exertional cp, hempoptysis, orthopnea pnd or change in chronic leg swelling. Also denies presyncope, palpitations, heartburn, abdominal pain, nausea, vomiting, diarrhea or change in bowel or urinary habits, dysuria,hematuria, rash, arthralgias, visual complaints, headache, numbness weakness or ataxia.     Objective:   Physical Exam  Gen. Pleasant, well-nourished, in no distress ENT - no lesions, no post nasal drip Neck: No JVD, no thyromegaly, no carotid bruits Lungs: no use of accessory muscles, no dullness to percussion, clear without rales or rhonchi  Cardiovascular: Rhythm regular, heart sounds  normal, no murmurs or gallops, no peripheral edema Musculoskeletal: No deformities, no cyanosis or clubbing        Assessment & Plan:

## 2015-01-01 ENCOUNTER — Ambulatory Visit: Payer: Medicare Other | Admitting: Pulmonary Disease

## 2015-01-05 ENCOUNTER — Encounter: Payer: Self-pay | Admitting: Pulmonary Disease

## 2015-01-15 ENCOUNTER — Telehealth: Payer: Self-pay | Admitting: Pulmonary Disease

## 2015-01-15 DIAGNOSIS — G4733 Obstructive sleep apnea (adult) (pediatric): Secondary | ICD-10-CM

## 2015-01-15 NOTE — Telephone Encounter (Signed)
Pt last seen by RA on 2.24.16: Patient Instructions       Get a new mask Use CPAP at least 4-6 h every night Use oxygen blended into CPAP - this is helping you   It doesn't look like the order was sent at the visit LMOM TCB x1 for pt - would like to know if she needs any other supplies before sending order.  Would also encourage pt to call back if she hasn't heard anything from [facility] within 1 week.

## 2015-01-15 NOTE — Telephone Encounter (Signed)
Pt returning call.[name] ° °

## 2015-01-16 NOTE — Telephone Encounter (Signed)
lmtcb x1 

## 2015-01-17 NOTE — Telephone Encounter (Signed)
lmtcb

## 2015-01-17 NOTE — Telephone Encounter (Signed)
[phone], pt cb

## 2015-01-17 NOTE — Telephone Encounter (Signed)
lmtcb for pt.  

## 2015-01-17 NOTE — Telephone Encounter (Signed)
Spoke with patient, order has been entered.  Sent message to [facility] to expedite request.  Will call to follow up.

## 2015-01-18 NOTE — Telephone Encounter (Signed)
Sharyn Lull, please advise if anything further is needed. Can this message be closed? Thanks.

## 2015-01-18 NOTE — Telephone Encounter (Signed)
Called patient and advised her that Lenna Sciara will be in touch with her about mask order.

## 2015-01-22 NOTE — Telephone Encounter (Signed)
Spoke with patient, she received a message from [place] on her voicemail, but she has not been able to reach her back to discuss mask.  Patient has a follow up appointment with Dr. Gwenette Greet tomorrow.  Will send a message to [facility] again to see if she can follow up on this.

## 2015-01-23 ENCOUNTER — Ambulatory Visit (INDEPENDENT_AMBULATORY_CARE_PROVIDER_SITE_OTHER): Payer: Medicare Other | Admitting: Family Medicine

## 2015-01-23 ENCOUNTER — Ambulatory Visit: Payer: Medicare Other | Admitting: Pulmonary Disease

## 2015-01-23 ENCOUNTER — Encounter: Payer: Self-pay | Admitting: Family Medicine

## 2015-01-23 VITALS — BP 124/64 | HR 60 | Temp 98.3°F | Ht 66.0 in | Wt 207.2 lb

## 2015-01-23 DIAGNOSIS — R05 Cough: Secondary | ICD-10-CM

## 2015-01-23 DIAGNOSIS — R059 Cough, unspecified: Secondary | ICD-10-CM | POA: Insufficient documentation

## 2015-01-23 NOTE — Progress Notes (Signed)
   Subjective:    Patient ID: [name], female    DOB: Mar 02, 1934, 81 y.o.   MRN: [id number]  Cough This is a new problem. The current episode started 1 to 4 weeks ago (1 month). The problem has been unchanged. The cough is non-productive. Associated symptoms include postnasal drip. Pertinent negatives include no chest pain, chills, ear congestion, ear pain, fever, headaches, myalgias, nasal congestion, shortness of breath or wheezing. Nothing aggravates the symptoms. Risk factors: nonsmoker. She has tried nothing for the symptoms. There is no history of asthma, bronchiectasis, bronchitis, COPD, emphysema, environmental allergies or pneumonia.      Review of Systems  Constitutional: Negative for fever and chills.  HENT: Positive for postnasal drip. Negative for ear pain.   Respiratory: Positive for cough. Negative for shortness of breath and wheezing.   Cardiovascular: Negative for chest pain.  Musculoskeletal: Negative for myalgias.  Allergic/Immunologic: Negative for environmental allergies.  Neurological: Negative for headaches.       Objective:   Physical Exam  Constitutional: Vital signs are normal. She appears well-developed and well-nourished. She is cooperative.  Non-toxic appearance. She does not appear ill. No distress.  HENT:  Head: Normocephalic.  Right Ear: Hearing, tympanic membrane, external ear and ear canal normal. Tympanic membrane is not erythematous, not retracted and not bulging.  Left Ear: Hearing, tympanic membrane, external ear and ear canal normal. Tympanic membrane is not erythematous, not retracted and not bulging.  Nose: Mucosal edema and rhinorrhea present. Right sinus exhibits no maxillary sinus tenderness and no frontal sinus tenderness. Left sinus exhibits no maxillary sinus tenderness and no frontal sinus tenderness.  Mouth/Throat: Uvula is midline, oropharynx is clear and moist and mucous membranes are normal.  Eyes: Conjunctivae, EOM and lids are  normal. Pupils are equal, round, and reactive to light. Lids are everted and swept, no foreign bodies found.  Neck: Trachea normal and normal range of motion. Neck supple. Carotid bruit is not present. No thyroid mass and no thyromegaly present.  Cardiovascular: Normal rate, regular rhythm, S1 normal, S2 normal, normal heart sounds, intact distal pulses and normal pulses.  Exam reveals no gallop and no friction rub.   No murmur heard. Pulmonary/Chest: Effort normal and breath sounds normal. No tachypnea. No respiratory distress. She has no decreased breath sounds. She has no wheezes. She has no rhonchi. She has no rales.  Neurological: She is alert.  Skin: Skin is warm, dry and intact. No rash noted.  Psychiatric: Her speech is normal and behavior is normal. Judgment normal. Her mood appears not anxious. Cognition and memory are normal. She does not exhibit a depressed mood.          Assessment & Plan:

## 2015-01-23 NOTE — Assessment & Plan Note (Signed)
Not clear sign of infeciton.  Likely due to allergies but consider GER as source if not improving.  Start flonase and zyrtec for allergies. F/U with PCP if not improving in 2 weeks.

## 2015-01-23 NOTE — Progress Notes (Signed)
Subjective:     Patient ID: [name], female   DOB: 01/11/1934, 81 y.o.   MRN: [id number]  Cough Associated symptoms include postnasal drip. Pertinent negatives include no fever, headaches, heartburn, rhinorrhea, sore throat or shortness of breath.   [name] is a 81 y/o F with a PMH of pulmonary hypertension, OSA, HTN, and DVT/PE presenting with a 1 month history of a dry non-productive cough. She is scheduled for eye surgery on 02/09/15 and is concerned that this cough will delay her surgery. The cough is worse at night, and has woken her up from sleep. She has used cough drops with some relief. She denies any associated sx, including headache, fever, itchy eyes, sore throat, congestion, or rhinorrhea. She endorses some postnasal drip.  She denies any sick contacts. Pt is a nonsmoker. Denies heartburn or sx of reflux.   Review of Systems  Constitutional: Negative for fever.  HENT: Positive for postnasal drip. Negative for congestion, rhinorrhea, sinus pressure, sneezing and sore throat.   Eyes: Negative for discharge and itching.  Respiratory: Positive for cough. Negative for shortness of breath.   Gastrointestinal: Negative for heartburn.  Neurological: Negative for headaches.     Objective:  Physical Exam  Constitutional: She appears well-developed and well-nourished.  HENT:  Head: Normocephalic and atraumatic.  Right Ear: Tympanic membrane and external ear normal. No drainage.  Left Ear: Tympanic membrane and external ear normal. No drainage.  Nose: Mucosal edema present. No rhinorrhea. Right sinus exhibits no maxillary sinus tenderness and no frontal sinus tenderness. Left sinus exhibits no maxillary sinus tenderness and no frontal sinus tenderness.  Mouth/Throat: Oropharynx is clear and moist and mucous membranes are normal. No oropharyngeal exudate.  Neck: Carotid bruit is not present. No thyroid mass and no thyromegaly present.  Cardiovascular: Normal rate, regular rhythm  and normal heart sounds.  Exam reveals no gallop and no friction rub.   No murmur heard. 1+ LE  edema present bilaterally  Pulmonary/Chest: Effort normal and breath sounds normal. She has no wheezes.  Lymphadenopathy:    She has no cervical adenopathy.  Skin: Skin is warm and dry. No rash noted.  Psychiatric: She has a normal mood and affect.     Assessment:   Cough: Most likely  due to allergies causing postnasal drip. Will try treating for allergies first. Could also be due to GERD. Less likely to be infectious cause due to lack of associated sx. Less likely to be something like lung cancer as the pt is a nonsmoker.  Plan:  Cough: Start taking OTC Zyrtec at bedtime. Also use OTC flonase to help with nasal swelling and postnasal drip, 2 sprays per nostril per day. Call in about a week if symptoms persist or worsen. Advised pt that cough sx are likely not infectious and should not affect her eye surgery.      Dante Gang served as Education administrator for Dr. Diona Browner  Patient seen and examined. Med student acted as Education administrator.  Agree with HPI/ROS/PE and assessment/plan above as scribed by med student.  Eliezer Lofts MD

## 2015-01-23 NOTE — Patient Instructions (Signed)
Start zyrtrtec at bedtime. Start flonase 2 sprays per nostril daily.  Call if symptoms not improving in 1-2 weeks or new symptoms like fever and shortness of breath.

## 2015-02-01 ENCOUNTER — Ambulatory Visit (INDEPENDENT_AMBULATORY_CARE_PROVIDER_SITE_OTHER): Payer: Medicare Other | Admitting: Family Medicine

## 2015-02-01 ENCOUNTER — Encounter: Payer: Self-pay | Admitting: Family Medicine

## 2015-02-01 VITALS — BP 140/62 | HR 54 | Temp 98.0°F | Ht 66.0 in | Wt 206.5 lb

## 2015-02-01 DIAGNOSIS — J0100 Acute maxillary sinusitis, unspecified: Secondary | ICD-10-CM | POA: Diagnosis not present

## 2015-02-01 MED ORDER — AZITHROMYCIN 250 MG PO TABS: ORAL_TABLET | ORAL | Status: DC

## 2015-02-01 MED ORDER — BENZONATATE 200 MG PO CAPS: 200.0000 mg | ORAL_CAPSULE | Freq: Two times a day (BID) | ORAL | Status: DC | PRN

## 2015-02-01 NOTE — Progress Notes (Signed)
Pre visit review using our clinic review tool, if applicable. No additional management support is needed unless otherwise documented below in the visit note. 

## 2015-02-01 NOTE — Patient Instructions (Signed)
Push fluids. Uses tessalon perles for cough. Complete antibiotics. Call if not improving at end of course of antibiotics, call sooner if shortness of breath or fever on antibiotics, got To ER with severe shortness of breath.

## 2015-02-01 NOTE — Progress Notes (Signed)
   Subjective:    Patient ID: [name], female    DOB: Mar 11, 1934, 81 y.o.   MRN: [id number]  HPI   81 year old female with history of PE, PSVT, pulmonary HTN,  presents with continued cough symptoms. Dry cough and PND had been going on or 1 month. Seen on  01/23/15 Dx with allergies. Recommended zyrtec at bedtime and flonase.  Today she reports  She is now coughing up mucus, yellow chunks. rhinorrhea. No blood. Stable SOB.  No fever. Now new bilateral sinus pressure and pain, no ear pain.  No ST.  No fatigue. She is some weaker, not eating as well as usual.  She is using coricidin without relief, did make things start draining.  Nonsmoker.            Review of Systems  Constitutional: Positive for fatigue. Negative for fever.  HENT: Negative for ear pain.   Eyes: Negative for pain.  Respiratory: Negative for chest tightness and shortness of breath.   Cardiovascular: Negative for chest pain, palpitations and leg swelling.  Gastrointestinal: Negative for abdominal pain.  Genitourinary: Negative for dysuria.       Objective:   Physical Exam  Constitutional: Vital signs are normal. She appears well-developed and well-nourished. She is cooperative.  Non-toxic appearance. She does not appear ill. No distress.  HENT:  Head: Normocephalic.  Right Ear: Hearing, tympanic membrane, external ear and ear canal normal. Tympanic membrane is not erythematous, not retracted and not bulging.  Left Ear: Hearing, tympanic membrane, external ear and ear canal normal. Tympanic membrane is not erythematous, not retracted and not bulging.  Nose: Mucosal edema and rhinorrhea present. Right sinus exhibits no maxillary sinus tenderness and no frontal sinus tenderness. Left sinus exhibits no maxillary sinus tenderness and no frontal sinus tenderness.  Mouth/Throat: Uvula is midline, oropharynx is clear and moist and mucous membranes are normal.  Eyes: Conjunctivae, EOM and lids are normal.  Pupils are equal, round, and reactive to light. Lids are everted and swept, no foreign bodies found.  Neck: Trachea normal and normal range of motion. Neck supple. Carotid bruit is not present. No thyroid mass and no thyromegaly present.  Cardiovascular: Normal rate, regular rhythm, S1 normal, S2 normal, normal heart sounds, intact distal pulses and normal pulses.  Exam reveals no gallop and no friction rub.   No murmur heard. Pulmonary/Chest: Effort normal and breath sounds normal. No tachypnea. No respiratory distress. She has no decreased breath sounds. She has no wheezes. She has no rhonchi. She has no rales.  Neurological: She is alert.  Skin: Skin is warm, dry and intact. No rash noted.  Psychiatric: Her speech is normal and behavior is normal. Judgment normal. Her mood appears not anxious. Cognition and memory are normal. She does not exhibit a depressed mood.          Assessment & Plan:  Acute sinus infection with cough likely due to post nasal dress: Given worsening , no improvement with allergy treatment.. Start antibiotics. Chose azithromycin given allergy to PCN.

## 2015-02-07 ENCOUNTER — Encounter: Payer: Self-pay | Admitting: Pulmonary Disease

## 2015-02-10 ENCOUNTER — Encounter (HOSPITAL_COMMUNITY): Payer: Self-pay | Admitting: *Deleted

## 2015-02-10 ENCOUNTER — Inpatient Hospital Stay (HOSPITAL_COMMUNITY)
Admission: EM | Admit: 2015-02-10 | Discharge: 2015-02-15 | DRG: 194 | Disposition: A | Discharge status: Home / Self Care | Payer: Medicare Other | Attending: Internal Medicine | Admitting: Internal Medicine

## 2015-02-10 ENCOUNTER — Emergency Department (HOSPITAL_COMMUNITY): Payer: Medicare Other

## 2015-02-10 DIAGNOSIS — R0602 Shortness of breath: Secondary | ICD-10-CM | POA: Diagnosis present

## 2015-02-10 DIAGNOSIS — I492 Junctional premature depolarization: Secondary | ICD-10-CM | POA: Diagnosis present

## 2015-02-10 DIAGNOSIS — Z880 Allergy status to penicillin: Secondary | ICD-10-CM

## 2015-02-10 DIAGNOSIS — Z888 Allergy status to other drugs, medicaments and biological substances status: Secondary | ICD-10-CM | POA: Diagnosis not present

## 2015-02-10 DIAGNOSIS — Z9841 Cataract extraction status, right eye: Secondary | ICD-10-CM | POA: Diagnosis not present

## 2015-02-10 DIAGNOSIS — I4581 Long QT syndrome: Secondary | ICD-10-CM | POA: Diagnosis present

## 2015-02-10 DIAGNOSIS — R778 Other specified abnormalities of plasma proteins: Secondary | ICD-10-CM | POA: Diagnosis present

## 2015-02-10 DIAGNOSIS — Z86718 Personal history of other venous thrombosis and embolism: Secondary | ICD-10-CM

## 2015-02-10 DIAGNOSIS — D649 Anemia, unspecified: Secondary | ICD-10-CM | POA: Diagnosis present

## 2015-02-10 DIAGNOSIS — I270 Primary pulmonary hypertension: Secondary | ICD-10-CM | POA: Diagnosis not present

## 2015-02-10 DIAGNOSIS — I495 Sick sinus syndrome: Secondary | ICD-10-CM | POA: Diagnosis present

## 2015-02-10 DIAGNOSIS — M8580 Other specified disorders of bone density and structure, unspecified site: Secondary | ICD-10-CM | POA: Diagnosis present

## 2015-02-10 DIAGNOSIS — I471 Supraventricular tachycardia: Secondary | ICD-10-CM | POA: Diagnosis present

## 2015-02-10 DIAGNOSIS — Z882 Allergy status to sulfonamides status: Secondary | ICD-10-CM | POA: Diagnosis not present

## 2015-02-10 DIAGNOSIS — E785 Hyperlipidemia, unspecified: Secondary | ICD-10-CM | POA: Diagnosis present

## 2015-02-10 DIAGNOSIS — R7989 Other specified abnormal findings of blood chemistry: Secondary | ICD-10-CM | POA: Diagnosis not present

## 2015-02-10 DIAGNOSIS — E871 Hypo-osmolality and hyponatremia: Secondary | ICD-10-CM | POA: Diagnosis present

## 2015-02-10 DIAGNOSIS — Z96653 Presence of artificial knee joint, bilateral: Secondary | ICD-10-CM | POA: Diagnosis present

## 2015-02-10 DIAGNOSIS — Z9049 Acquired absence of other specified parts of digestive tract: Secondary | ICD-10-CM | POA: Diagnosis present

## 2015-02-10 DIAGNOSIS — G4733 Obstructive sleep apnea (adult) (pediatric): Secondary | ICD-10-CM | POA: Diagnosis present

## 2015-02-10 DIAGNOSIS — G43A0 Cyclical vomiting, not intractable: Secondary | ICD-10-CM | POA: Diagnosis not present

## 2015-02-10 DIAGNOSIS — A419 Sepsis, unspecified organism: Secondary | ICD-10-CM | POA: Diagnosis not present

## 2015-02-10 DIAGNOSIS — R9431 Abnormal electrocardiogram [ECG] [EKG]: Secondary | ICD-10-CM | POA: Diagnosis present

## 2015-02-10 DIAGNOSIS — E669 Obesity, unspecified: Secondary | ICD-10-CM | POA: Diagnosis present

## 2015-02-10 DIAGNOSIS — I480 Paroxysmal atrial fibrillation: Secondary | ICD-10-CM | POA: Diagnosis not present

## 2015-02-10 DIAGNOSIS — R079 Chest pain, unspecified: Secondary | ICD-10-CM | POA: Diagnosis not present

## 2015-02-10 DIAGNOSIS — E872 Acidosis: Secondary | ICD-10-CM

## 2015-02-10 DIAGNOSIS — Z881 Allergy status to other antibiotic agents status: Secondary | ICD-10-CM | POA: Diagnosis not present

## 2015-02-10 DIAGNOSIS — Z86010 Personal history of colonic polyps: Secondary | ICD-10-CM | POA: Diagnosis not present

## 2015-02-10 DIAGNOSIS — J181 Lobar pneumonia, unspecified organism: Secondary | ICD-10-CM | POA: Diagnosis not present

## 2015-02-10 DIAGNOSIS — R531 Weakness: Secondary | ICD-10-CM | POA: Insufficient documentation

## 2015-02-10 DIAGNOSIS — I10 Essential (primary) hypertension: Secondary | ICD-10-CM | POA: Diagnosis present

## 2015-02-10 DIAGNOSIS — IMO0001 Reserved for inherently not codable concepts without codable children: Secondary | ICD-10-CM | POA: Insufficient documentation

## 2015-02-10 DIAGNOSIS — J189 Pneumonia, unspecified organism: Principal | ICD-10-CM | POA: Diagnosis present

## 2015-02-10 DIAGNOSIS — I4510 Unspecified right bundle-branch block: Secondary | ICD-10-CM | POA: Diagnosis present

## 2015-02-10 DIAGNOSIS — E860 Dehydration: Secondary | ICD-10-CM | POA: Diagnosis present

## 2015-02-10 DIAGNOSIS — Z86711 Personal history of pulmonary embolism: Secondary | ICD-10-CM | POA: Diagnosis present

## 2015-02-10 DIAGNOSIS — R112 Nausea with vomiting, unspecified: Secondary | ICD-10-CM | POA: Diagnosis present

## 2015-02-10 DIAGNOSIS — I272 Other secondary pulmonary hypertension: Secondary | ICD-10-CM | POA: Diagnosis present

## 2015-02-10 DIAGNOSIS — E780 Pure hypercholesterolemia: Secondary | ICD-10-CM | POA: Diagnosis present

## 2015-02-10 DIAGNOSIS — M1990 Unspecified osteoarthritis, unspecified site: Secondary | ICD-10-CM | POA: Diagnosis present

## 2015-02-10 DIAGNOSIS — Z9842 Cataract extraction status, left eye: Secondary | ICD-10-CM

## 2015-02-10 LAB — CBC WITH DIFFERENTIAL/PLATELET
BASOS ABS: 0 10*3/uL (ref 0.0–0.1)
BASOS PCT: 0 % (ref 0–1)
EOS PCT: 1 % (ref 0–5)
Eosinophils Absolute: 0.2 10*3/uL (ref 0.0–0.7)
HCT: 44.5 % (ref 36.0–46.0)
HEMOGLOBIN: 14.2 g/dL (ref 12.0–15.0)
LYMPHS PCT: 19 % (ref 12–46)
Lymphs Abs: 2 10*3/uL (ref 0.7–4.0)
MCH: 29.4 pg (ref 26.0–34.0)
MCHC: 31.9 g/dL (ref 30.0–36.0)
MCV: 92.1 fL (ref 78.0–100.0)
MONO ABS: 0.8 10*3/uL (ref 0.1–1.0)
MONOS PCT: 8 % (ref 3–12)
NEUTROS ABS: 7.4 10*3/uL (ref 1.7–7.7)
Neutrophils Relative %: 72 % (ref 43–77)
Platelets: 192 10*3/uL (ref 150–400)
RBC: 4.83 MIL/uL (ref 3.87–5.11)
RDW: 13.8 % (ref 11.5–15.5)
WBC: 10.4 10*3/uL (ref 4.0–10.5)

## 2015-02-10 LAB — URINALYSIS, ROUTINE W REFLEX MICROSCOPIC
BILIRUBIN URINE: NEGATIVE
GLUCOSE, UA: NEGATIVE mg/dL
Hgb urine dipstick: NEGATIVE
Ketones, ur: NEGATIVE mg/dL
Leukocytes, UA: NEGATIVE
Nitrite: NEGATIVE
Protein, ur: NEGATIVE mg/dL
SPECIFIC GRAVITY, URINE: 1.024 (ref 1.005–1.030)
UROBILINOGEN UA: 0.2 mg/dL (ref 0.0–1.0)
pH: 5 (ref 5.0–8.0)

## 2015-02-10 LAB — COMPREHENSIVE METABOLIC PANEL
ALBUMIN: 3.2 g/dL — AB (ref 3.5–5.2)
ALT: 19 U/L (ref 0–35)
AST: 37 U/L (ref 0–37)
Alkaline Phosphatase: 66 U/L (ref 39–117)
Anion gap: 13 (ref 5–15)
BILIRUBIN TOTAL: 1.1 mg/dL (ref 0.3–1.2)
BUN: 23 mg/dL (ref 6–23)
CO2: 24 mmol/L (ref 19–32)
Calcium: 10.1 mg/dL (ref 8.4–10.5)
Chloride: 97 mmol/L (ref 96–112)
Creatinine, Ser: 1.05 mg/dL (ref 0.50–1.10)
GFR calc non Af Amer: 49 mL/min — ABNORMAL LOW (ref >90)
GFR, EST AFRICAN AMERICAN: 57 mL/min — AB (ref >90)
Glucose, Bld: 151 mg/dL — ABNORMAL HIGH (ref 70–99)
Potassium: 4.5 mmol/L (ref 3.5–5.1)
SODIUM: 134 mmol/L — AB (ref 135–145)
Total Protein: 7.1 g/dL (ref 6.0–8.3)

## 2015-02-10 LAB — I-STAT CG4 LACTIC ACID, ED
LACTIC ACID, VENOUS: 2.18 mmol/L — AB (ref 0.5–2.0)
Lactic Acid, Venous: 3.71 mmol/L (ref 0.5–2.0)
Lactic Acid, Venous: 1.65 mmol/L (ref 0.5–2.0)

## 2015-02-10 LAB — MAGNESIUM: Magnesium: 1.7 mg/dL (ref 1.5–2.5)

## 2015-02-10 LAB — PHOSPHORUS: Phosphorus: 3.4 mg/dL (ref 2.3–4.6)

## 2015-02-10 LAB — I-STAT TROPONIN, ED: Troponin i, poc: 0.36 ng/mL (ref 0.00–0.08)

## 2015-02-10 LAB — INFLUENZA PANEL BY PCR (TYPE A & B)
H1N1 flu by pcr: NOT DETECTED
INFLAPCR: NEGATIVE
Influenza B By PCR: NEGATIVE

## 2015-02-10 LAB — TROPONIN I
TROPONIN I: 0.48 ng/mL — AB (ref <0.031)
Troponin I: 0.35 ng/mL — ABNORMAL HIGH (ref <0.031)

## 2015-02-10 LAB — LIPASE, BLOOD: LIPASE: 23 U/L (ref 11–59)

## 2015-02-10 MED ORDER — VANCOMYCIN HCL 10 G IV SOLR
2000.0000 mg | INTRAVENOUS | Status: AC
  Administered 2015-02-10: 2000 mg via INTRAVENOUS
  Filled 2015-02-10: qty 2000

## 2015-02-10 MED ORDER — LEVOFLOXACIN IN D5W 750 MG/150ML IV SOLN: 750.0000 mg | INTRAVENOUS | Status: DC

## 2015-02-10 MED ORDER — DEXTROSE 5 % IV SOLN
1.0000 g | Freq: Three times a day (TID) | INTRAVENOUS | Status: DC
  Filled 2015-02-10 (×2): qty 1

## 2015-02-10 MED ORDER — VANCOMYCIN HCL IN DEXTROSE 750-5 MG/150ML-% IV SOLN
750.0000 mg | Freq: Two times a day (BID) | INTRAVENOUS | Status: DC
  Administered 2015-02-11 – 2015-02-12 (×3): 750 mg via INTRAVENOUS
  Filled 2015-02-10 (×5): qty 150

## 2015-02-10 MED ORDER — DIPHENHYDRAMINE HCL 25 MG PO CAPS
25.0000 mg | ORAL_CAPSULE | Freq: Once | ORAL | Status: AC
  Administered 2015-02-10: 25 mg via ORAL
  Filled 2015-02-10: qty 1

## 2015-02-10 MED ORDER — VANCOMYCIN HCL IN DEXTROSE 1-5 GM/200ML-% IV SOLN: 1000.0000 mg | Freq: Once | INTRAVENOUS | Status: DC

## 2015-02-10 MED ORDER — SODIUM CHLORIDE 0.9 % IV BOLUS (SEPSIS)
1000.0000 mL | INTRAVENOUS | Status: AC
  Administered 2015-02-10 (×3): 1000 mL via INTRAVENOUS

## 2015-02-10 MED ORDER — SODIUM CHLORIDE 0.9 % IV SOLN
INTRAVENOUS | Status: DC
  Administered 2015-02-10: 20:00:00 via INTRAVENOUS

## 2015-02-10 MED ORDER — DEXTROSE 5 % IV SOLN
2.0000 g | Freq: Three times a day (TID) | INTRAVENOUS | Status: DC
  Administered 2015-02-10 – 2015-02-12 (×6): 2 g via INTRAVENOUS
  Filled 2015-02-10 (×8): qty 2

## 2015-02-10 MED ORDER — HEPARIN SODIUM (PORCINE) 5000 UNIT/ML IJ SOLN
5000.0000 [IU] | Freq: Three times a day (TID) | INTRAMUSCULAR | Status: DC
  Administered 2015-02-10 – 2015-02-13 (×8): 5000 [IU] via SUBCUTANEOUS
  Filled 2015-02-10 (×11): qty 1

## 2015-02-10 MED ORDER — SODIUM CHLORIDE 0.9 % IV BOLUS (SEPSIS)
500.0000 mL | Freq: Once | INTRAVENOUS | Status: AC
  Administered 2015-02-10: 500 mL via INTRAVENOUS

## 2015-02-10 MED ORDER — AZTREONAM 2 G IJ SOLR
2.0000 g | Freq: Once | INTRAMUSCULAR | Status: AC
  Administered 2015-02-10: 2 g via INTRAVENOUS
  Filled 2015-02-10: qty 2

## 2015-02-10 MED ORDER — LEVOFLOXACIN IN D5W 750 MG/150ML IV SOLN
750.0000 mg | Freq: Once | INTRAVENOUS | Status: AC
  Administered 2015-02-10: 750 mg via INTRAVENOUS
  Filled 2015-02-10: qty 150

## 2015-02-10 MED ORDER — VANCOMYCIN HCL IN DEXTROSE 750-5 MG/150ML-% IV SOLN
750.0000 mg | Freq: Two times a day (BID) | INTRAVENOUS | Status: DC
  Filled 2015-02-10: qty 150

## 2015-02-10 NOTE — Consult Note (Addendum)
Reason for Consult: Abnormal ECG and troponin  Requesting Physician: Wendee Beavers  Cardiologist: Allred  HPI: This is a 81 y.o. female with a past medical history significant for recurrent episodes of paroxysmal atrial tachycardia, obstructive sleep apnea versus obesity hypoventilation syndrome as well as pulmonary hypertension at least in part secondary to previous venous thromboembolic disease (bilateral pulmonary embolism following bilateral knee replacement), with subsequent right heart failure. Echocardiography has estimated PA pressures as high as 73 mmHg but invasive evaluation right heart catheterization December 2015 showed a PA pressure 43/12 (mean 25) mm Hg. however right ventricular strain has been noted both on echocardiography and CT angiography of the chest. She no longer takes anticoagulation since December 2015.  Roughly 10 days ago she started treatment with azithromycin for upper respiratory tract infection symptoms with cough productive of yellow sputum and possible low-grade fever. Over the last several days she has had anorexia, nausea and occasional vomiting and general weakness and lassitude.  In the emergency room she had one recorded low blood pressure but subsequently has had normal blood pressure after fairly limited fluid resuscitation. She has been alert but sluggish at all times. She denies any respiratory distress and has not had any chest discomfort now or in the recent past.  Her labs are significant for lactic acidosis with normal renal function, minimally elevated cardiac troponin I of 0.36. Potassium level is normal  Electrocardiogram is significant for marked QT interval prolongation with QTC of over 630 ms and inverted T waves in the anterior and anteroseptal leads. She has a right bundle branch block, also a new finding, but is not tachycardic. The QT interval was completely normal on previous tracings from September and July 2015.  PMHx:  Past Medical  History  Diagnosis Date  . Osteopenia   . Hyperlipidemia   . Hypertension   . PSVT (paroxysmal supraventricular tachycardia)   . Spinal stenosis   . Overactive bladder     urge incontinence  . Pulmonary emboli   . Pulmonary hypertension   . Right ventricular failure   . Arthritis     knee  . Anemia   . Diverticulosis   . Sleep apnea     CPAP  . Hx of colonic polyps 10/24/2014   Past Surgical History  Procedure Laterality Date  . Cataract extraction Bilateral 2008  . Bladder suspension  1993  . Total knee arthroplasty Left 2000  . Total knee revision  01/12/2012    Procedure: TOTAL KNEE REVISION;  Surgeon: Kerin Salen, MD;  Location: [place];  Service: Orthopedics;  Laterality: Left;  DEPUY/ LCS , HAND SET  . Trigger finger release  01/12/2012    Procedure: RELEASE TRIGGER FINGER/A-1 PULLEY;  Surgeon: Kerin Salen, MD;  Location: [place];  Service: Orthopedics;  Laterality: Right;  . Cholecystectomy    . Incisional hernia repair    . Total knee arthroplasty Left 10/29/2012    at [facility]  . Total knee arthroplasty Right 03/09/2014  . Colonoscopy    . Right heart catheterization N/A 07/04/2014    Procedure: RIGHT HEART CATH;  Surgeon: Jolaine Artist, MD;  Location: [facility] C[facility];  Service: Cardiovascular;  Laterality: N/A;  . Tonsillectomy and adenoidectomy    . Colonoscopy N/A 10/24/2014    Procedure: COLONOSCOPY;  Surgeon: Gatha Mayer, MD;  Location: [facility];  Service: Endoscopy;  Laterality: N/A;    FAMHx: Family History  Problem Relation Age of Onset  . Breast cancer Mother   .  Heart disease Father   . Diabetes Father   . Hypertension Father   . Uterine cancer Sister     SOCHx:  reports that she has never smoked. She has never used smokeless tobacco. She reports that she does not drink alcohol or use illicit drugs.  ALLERGIES: Allergies  Allergen Reactions  . Penicillins Hives  . Sulfonamide Derivatives Hives  . Adhesive [Tape] Other (See Comments)     redness  . Prednisone Other (See Comments)    Causes insomnia    ROS: The patient specifically denies any chest pain at rest or with exertion, dyspnea at rest or with exertion, orthopnea, paroxysmal nocturnal dyspnea, syncope, palpitations, focal neurological deficits, intermittent claudication, lower extremity edema, unexplained weight gain, hemoptysis or wheezing.  The patient also denies abdominal pain, dysphagia, diarrhea, constipation, polyuria, polydipsia, dysuria, hematuria, frequency, urgency, abnormal bleeding or bruising, chills, unexpected weight changes, mood swings, change in skin or hair texture, change in voice quality, auditory or visual problems, allergic reactions or rashes, new musculoskeletal complaints other than usual "aches and pains".  HOME MEDICATIONS: No current facility-administered medications on file prior to encounter.   Current Outpatient Prescriptions on File Prior to Encounter  Medication Sig Dispense Refill  . benzonatate (TESSALON) 200 MG capsule Take 1 capsule (200 mg total) by mouth 2 (two) times daily as needed for cough. 20 capsule 0  . Calcium Carb-Cholecalciferol (CALCIUM 600/VITAMIN D3) 600-800 MG-UNIT TABS Take 1 tablet by mouth 2 (two) times daily.    . diphenhydrAMINE (BENADRYL) 25 mg capsule Take 25 mg by mouth every morning.     . metoprolol (LOPRESSOR) 50 MG tablet Take 50 mg by mouth daily.    . Multiple Vitamin (MULTIVITAMIN WITH MINERALS) TABS tablet Take 1 tablet by mouth daily.    . OXYGEN Inhale 2 L/min into the lungs at bedtime.    . polyethylene glycol (MIRALAX / GLYCOLAX) packet Take 8.5 g by mouth daily.     . verapamil (CALAN-SR) 120 MG CR tablet TAKE 1 TABLET AT BEDTIME 90 tablet 0  . vitamin C (ASCORBIC ACID) 500 MG tablet Take 500 mg by mouth daily.    Marland Kitchen azithromycin (ZITHROMAX) 250 MG tablet 2 tab po x 1 day then 1 tab po daily (Patient not taking: Reported on 02/10/2015) 6 tablet 0     VITALS: Blood pressure 121/70, pulse 61,  temperature 97.7 F (36.5 C), temperature source Oral, resp. rate 17, height 5\' 6"  (1.676 m), weight 206 lb (93.441 kg), SpO2 95 %.  PHYSICAL EXAM:  General: Alert, oriented x3, no distress Head: no evidence of trauma, PERRL, EOMI, no exophtalmos or lid lag, no myxedema, no xanthelasma; normal ears, nose and oropharynx Neck: Hard to evaluate, probably normal jugular venous pulsations and no hepatojugular reflux; brisk carotid pulses without delay and no carotid bruits Chest: clear to auscultation, no signs of consolidation by percussion or palpation, normal fremitus, symmetrical and full respiratory excursions Cardiovascular: normal position and quality of the apical impulse, regular rhythm, normal first heart sound and widely split second heart sound, no rubs or gallops, no murmur Abdomen: no tenderness or distention, no masses by palpation, no abnormal pulsatility or arterial bruits, normal bowel sounds, no hepatosplenomegaly Extremities: no clubbing, cyanosis;  no edema; 2+ radial, ulnar and brachial pulses bilaterally; 2+ right femoral, posterior tibial and dorsalis pedis pulses; 2+ left femoral, posterior tibial and dorsalis pedis pulses; no subclavian or femoral bruits Neurological: grossly nonfocal   LABS  CBC  Recent Labs  02/10/15 1106  WBC 10.4  NEUTROABS 7.4  HGB 14.2  HCT 44.5  MCV 92.1  PLT 379   Basic Metabolic Panel  Recent Labs  02/10/15 1106  NA 134*  K 4.5  CL 97  CO2 24  GLUCOSE 151*  BUN 23  CREATININE 1.05  CALCIUM 10.1   Liver Function Tests  Recent Labs  02/10/15 1106  AST 37  ALT 19  ALKPHOS 66  BILITOT 1.1  PROT 7.1  ALBUMIN 3.2*    Recent Labs  02/10/15 1106  LIPASE 23     IMAGING: Dg Chest Port 1 View  02/10/2015   CLINICAL DATA:  Acute onset of weakness. History of right ventricular failure and hypertension.  EXAM: PORTABLE CHEST - 1 VIEW  COMPARISON:  05/30/2014  FINDINGS: The heart is mildly enlarged. There is tortuosity  and mild ectasia of the thoracic aorta. There are chronic emphysematous and bronchitic lung changes but no acute overlying pulmonary process. No pleural effusion. The bony thorax is intact.  IMPRESSION: Mild cardiac enlargement.  Chronic lung changes but no acute overlying pulmonary process.   Electronically Signed   By: Marijo Sanes M.D.   On: 02/10/2015 13:25    ECG: Sinus rhythm, right bundle branch block, severely prolonged QT interval Occasional junctional beats  TELEMETRY:  normal sinus rhythm  IMPRESSION: 1. Severe QT interval prolongation - although she has lactic acidosis, no other major metabolic abnormalities are seen. Coronary artery insufficiency needs to be excluded. Notes relatively mild coronary artery calcification for her age on the CT scans from last year. Alternatively, azithromycin may be responsible at least in part for QT interval propagation. Azithromycin may also be responsible for her GI complaints which in turn could've caused the anorexia and relative dehydration which seem to be the substrate for her lactic acidosis. 2. Abnormal cardiac troponin I - the degree of elevation is very mild and nonspecific. In addition to coronary disease recurrent pulmonary embolism should be considered, although her history is not indicative of either diagnosis. The pattern of troponin elevation on repeat assays may be revealing. 3. Chronic pulmonary artery hypertension - a diagnosis of scleroderma was entertained at one point but her physical exam is otherwise not supportive of this diagnosis and reportedly serologies were negative in the past. The pulmonary hypertension is most likely secondary to obstructive sleep apnea and/or obesity hypoventilation syndrome in addition to sequelae of previous bilateral pulmonary embolism 4. Paroxysmal atrial tachycardia - she has periods of bradycardia and even junctional escape beats and I think we will reduce the dose of beta blocker at least  temporarily. The bradycardia will make her more vulnerable to ventricular arrhythmia in the setting of severely prolonged QT interval.  RECOMMENDATION: 1. Lexiscan Myoview 2. Repeat ECG in a.m. and repeat cardiac enzymes 3. Reevaluate degree of pulmonary hypertension by echocardiography 4. Avoid all drugs with potential QT prolonging effects to include quinolone antibiotics, colitis, phenothiazines, etc.  Time Spent Directly with Patient: 60 minutes  Sanda Klein, MD, [facility] [phone] office [phone] pager   02/10/2015, 3:39 PM

## 2015-02-10 NOTE — Consult Note (Signed)
Name: [name] MRN: [id number] DOB: [birth year]    ADMISSION DATE:  02/10/2015 CONSULTATION DATE:  02/10/15  REFERRING MD :  Jeanell Sparrow, MD  CHIEF COMPLAINT:  Lactic acidosis  BRIEF PATIENT DESCRIPTION:  81 yr old h/o SVT, Mod Pa htn presents with URI followed by poor intake, N, V and lactic acidosis  SIGNIFICANT EVENTS  4/16- ER admission, LA clearing   HISTORY OF PRESENT ILLNESS: 81 yr old PSVT, PE after knee surgery, RV strain presented with 3 weeks URi , cough , no CP, some nonspecific not well defined fevers. Yellow sputum. received azithromycin course. Developed N/V and weakness and lack of intake. LPN daughter took BP, noted 128/116, likely error. Came to ER for assessment. Found to have lactic acidosis.  Called for assessment. Received some fluids but had sepsis protocol started.  PAST MEDICAL HISTORY :   has a past medical history of Osteopenia; Hyperlipidemia; Hypertension; PSVT (paroxysmal supraventricular tachycardia); Spinal stenosis; Overactive bladder; Pulmonary emboli; Pulmonary hypertension; Right ventricular failure; Arthritis; Anemia; Diverticulosis; Sleep apnea; and colonic polyps (10/24/2014).  has past surgical history that includes Cataract extraction (Bilateral, 2008); Bladder suspension (1993); Total knee arthroplasty (Left, 2000); Total knee revision (01/12/2012); Trigger finger release (01/12/2012); Cholecystectomy; Incisional hernia repair; Total knee arthroplasty (Left, 10/29/2012); Total knee arthroplasty (Right, 03/09/2014); Colonoscopy; right heart catheterization (N/A, 07/04/2014); Tonsillectomy and adenoidectomy; and Colonoscopy (N/A, 10/24/2014). Prior to Admission medications   Medication Sig Start Date End Date Taking? Authorizing Provider  benzonatate (TESSALON) 200 MG capsule Take 1 capsule (200 mg total) by mouth 2 (two) times daily as needed for cough. 02/01/15  Yes Amy Cletis [place], MD  Calcium Carb-Cholecalciferol (CALCIUM 600/VITAMIN D3) 600-800 MG-UNIT TABS Take  1 tablet by mouth 2 (two) times daily.   Yes Historical Provider, MD  diphenhydrAMINE (BENADRYL) 25 mg capsule Take 25 mg by mouth every morning.    Yes Historical Provider, MD  metoprolol (LOPRESSOR) 50 MG tablet Take 50 mg by mouth daily.   Yes Historical Provider, MD  Multiple Vitamin (MULTIVITAMIN WITH MINERALS) TABS tablet Take 1 tablet by mouth daily.   Yes Historical Provider, MD  OXYGEN Inhale 2 L/min into the lungs at bedtime.   Yes Historical Provider, MD  polyethylene glycol (MIRALAX / GLYCOLAX) packet Take 8.5 g by mouth daily.    Yes Historical Provider, MD  verapamil (CALAN-SR) 120 MG CR tablet TAKE 1 TABLET AT BEDTIME 11/27/14  Yes Thompson Grayer, MD  vitamin C (ASCORBIC ACID) 500 MG tablet Take 500 mg by mouth daily.   Yes Historical Provider, MD  azithromycin (ZITHROMAX) 250 MG tablet 2 tab po x 1 day then 1 tab po daily Patient not taking: Reported on 02/10/2015 02/01/15   Jinny Sanders, MD   Allergies  Allergen Reactions  . Penicillins Hives  . Sulfonamide Derivatives Hives  . Adhesive [Tape] Other (See Comments)    redness  . Prednisone Other (See Comments)    Causes insomnia    FAMILY HISTORY:  family history includes Breast cancer in her mother; Diabetes in her father; Heart disease in her father; Hypertension in her father; Uterine cancer in her sister. SOCIAL HISTORY:  reports that she has never smoked. She has never used smokeless tobacco. She reports that she does not drink alcohol or use illicit drugs.  REVIEW OF SYSTEMS:   Constitutional: Negative for measured fever or chills, weight loss, did report malaise/fatigue and no diaphoresis.  HENT: Negative for hearing loss, ear pain, nosebleeds, did report congestion, neg for sore throat,  neck pain, tinnitus and ear discharge.   Eyes: Negative for blurred vision, double vision, photophobia, pain, discharge and redness.  Respiratory: Negative for cough, hemoptysis, sputum production, shortness of breath, wheezing and  stridor.   Cardiovascular: Negative for chest pain, palpitations, orthopnea, claudication, leg swelling and PND.  Gastrointestinal: Negative for heartburn, nausea, vomiting, abdominal pain, diarrhea, constipation, blood in stool and melena.  Genitourinary: Negative for dysuria, urgency, frequency, hematuria and flank pain.  Musculoskeletal: Negative for myalgias, back pain, joint pain and falls.  Skin: Negative for itching and rash.  Neurological: Negative for dizziness, tingling, tremors, sensory change, speech change, focal weakness, seizures, loss of consciousness, weakness and headaches.  Endo/Heme/Allergies: Negative for environmental allergies and polydipsia. Does not bruise/bleed easily.  SUBJECTIVE:   VITAL SIGNS: Temp:  [97.7 F (36.5 C)] 97.7 F (36.5 C) (04/16 1241) Pulse Rate:  [45-61] 61 (04/16 1345) Resp:  [15-27] 17 (04/16 1345) BP: (87-124)/(65-84) 121/70 mmHg (04/16 1345) SpO2:  [93 %-96 %] 95 % (04/16 1345) Weight:  [93.441 kg (206 lb)] 93.441 kg (206 lb) (04/16 1241)  PHYSICAL EXAMINATION: General:  Awake, not toxic, no distress Neuro: nonfocal, exam, no r/g HEENT:  jvd low Cardiovascular:  s1 s2 RRR int brady, no r Lungs:  Slight coarse Abdomen:  Soft, BS wnl, no r/g Musculoskeletal:  No rash, no edema Skin: no rash    Recent Labs Lab 02/10/15 1106  NA 134*  K 4.5  CL 97  CO2 24  BUN 23  CREATININE 1.05  GLUCOSE 151*    Recent Labs Lab 02/10/15 1106  HGB 14.2  HCT 44.5  WBC 10.4  PLT 192   Dg Chest Port 1 View  02/10/2015   CLINICAL DATA:  Acute onset of weakness. History of right ventricular failure and hypertension.  EXAM: PORTABLE CHEST - 1 VIEW  COMPARISON:  05/30/2014  FINDINGS: The heart is mildly enlarged. There is tortuosity and mild ectasia of the thoracic aorta. There are chronic emphysematous and bronchitic lung changes but no acute overlying pulmonary process. No pleural effusion. The bony thorax is intact.  IMPRESSION: Mild cardiac  enlargement.  Chronic lung changes but no acute overlying pulmonary process.   Electronically Signed   By: Marijo Sanes M.D.   On: 02/10/2015 13:25    ASSESSMENT / PLAN:  1. Lactic Acidosis- related to Hypovolemia, r/o underlying cardiac hypo perfusion contribution 2. Sinusitis treated 3. Conduction delay , r/o ischemia 4. Mod Pa htn  - repeat la done in ed just back and is clearing well even after start of fluids - her lowest MAP in ED 69, she is mentating well, no resp distress -would continue the 30 cc/kg, would repeat perfusion examination , would give empiric abx -cardiology on board and assessing begun -consider doppler lower ext -consider echo re assessment -pcn allergy noted, would use levofloxacin, she has limited MRSA risk, would NOT use double coverage gram neg -would use pct algorithm to dc abx if no focus isolated -call if needed in future -tele required, follow trop -keep pos balance  Lavon Paganini. Titus Mould, MD, [place] Pgr: [facility]  Pulmonary and [place] Pager: [phone]  02/10/2015, 3:21 PM

## 2015-02-10 NOTE — ED Provider Notes (Signed)
CSN: [id number]     Arrival date & time 02/10/15  1231 History   First MD Initiated Contact with Patient 02/10/15 1249     Chief Complaint  Patient presents with  . Irregular Heart Beat     (Consider location/radiation/quality/duration/timing/severity/associated sxs/prior Treatment) HPI 81 year old female with multiple health problems who presents today with worsening weakness, nausea, and dry heaves. Her daughter is the primary historian. She is here with her fianc who does not live in the same house. Daughter states that she began being sick prior to the ninth of the month. She had cough and congestion. She is seeing her primary care physician's office and treated with Zithromax. She seemed to improve during the treatment. Over the past several days she has had decreased appetite and increasing weakness. Today she became weaker that she had an the past several days. Her daughter states her blood pressure is 128/116 which she reports as being high for her. She is brought in secondary to this. Nursing notes state that she has had an irregular heartbeat but I'm unable to obtain this history from the patient or her daughter. She does have a history of PE in the past associated with knee replacement. She denies any new symptoms of lateralized swelling, hemoptysis, or dyspnea. Her daughter thinks that she has had increased frequency, concentration, and odor to her urine. Past Medical History  Diagnosis Date  . Osteopenia   . Hyperlipidemia   . Hypertension   . PSVT (paroxysmal supraventricular tachycardia)   . Spinal stenosis   . Overactive bladder     urge incontinence  . Pulmonary emboli   . Pulmonary hypertension   . Right ventricular failure   . Arthritis     knee  . Anemia   . Diverticulosis   . Sleep apnea     CPAP  . Hx of colonic polyps 10/24/2014   Past Surgical History  Procedure Laterality Date  . Cataract extraction Bilateral 2008  . Bladder suspension  1993  . Total knee  arthroplasty Left 2000  . Total knee revision  01/12/2012    Procedure: TOTAL KNEE REVISION;  Surgeon: Kerin Salen, MD;  Location: [place];  Service: Orthopedics;  Laterality: Left;  DEPUY/ LCS , HAND SET  . Trigger finger release  01/12/2012    Procedure: RELEASE TRIGGER FINGER/A-1 PULLEY;  Surgeon: Kerin Salen, MD;  Location: [place];  Service: Orthopedics;  Laterality: Right;  . Cholecystectomy    . Incisional hernia repair    . Total knee arthroplasty Left 10/29/2012    at [facility]  . Total knee arthroplasty Right 03/09/2014  . Colonoscopy    . Right heart catheterization N/A 07/04/2014    Procedure: RIGHT HEART CATH;  Surgeon: Jolaine Artist, MD;  Location: [facility] CATH LAB;  Service: Cardiovascular;  Laterality: N/A;  . Tonsillectomy and adenoidectomy    . Colonoscopy N/A 10/24/2014    Procedure: COLONOSCOPY;  Surgeon: Gatha Mayer, MD;  Location: [facility];  Service: Endoscopy;  Laterality: N/A;   Family History  Problem Relation Age of Onset  . Breast cancer Mother   . Heart disease Father   . Diabetes Father   . Hypertension Father   . Uterine cancer Sister    History  Substance Use Topics  . Smoking status: Never Smoker   . Smokeless tobacco: Never Used  . Alcohol Use: No   OB History    No data available     Review of Systems  All other  systems reviewed and are negative.     Allergies  Penicillins; Sulfonamide derivatives; Adhesive; and Prednisone  Home Medications   Prior to Admission medications   Medication Sig Start Date End Date Taking? Authorizing Provider  azithromycin (ZITHROMAX) 250 MG tablet 2 tab po x 1 day then 1 tab po daily 02/01/15   Amy E Diona Browner, MD  benzonatate (TESSALON) 200 MG capsule Take 1 capsule (200 mg total) by mouth 2 (two) times daily as needed for cough. 02/01/15   Amy Cletis Athens, MD  Calcium Carb-Cholecalciferol (CALCIUM 600/VITAMIN D3) 600-800 MG-UNIT TABS Take 1 tablet by mouth 2 (two) times daily.    Historical Provider, MD   diphenhydrAMINE (BENADRYL) 25 mg capsule Take 25 mg by mouth daily.    Historical Provider, MD  metoprolol (LOPRESSOR) 50 MG tablet Take 50 mg by mouth daily.    Historical Provider, MD  Multiple Vitamin (MULTIVITAMIN WITH MINERALS) TABS tablet Take 1 tablet by mouth daily.    Historical Provider, MD  OXYGEN Inhale 2 L/min into the lungs at bedtime.    Historical Provider, MD  polyethylene glycol (MIRALAX / GLYCOLAX) packet Take 8.5 g by mouth daily.     Historical Provider, MD  verapamil (CALAN-SR) 120 MG CR tablet TAKE 1 TABLET AT BEDTIME 11/27/14   Thompson Grayer, MD  vitamin C (ASCORBIC ACID) 500 MG tablet Take 500 mg by mouth daily.    Historical Provider, MD   BP 87/69 mmHg  Pulse 45  Temp(Src) 97.7 F (36.5 C) (Oral)  Resp 18  Ht 5\' 6"  (1.676 m)  Wt 206 lb (93.441 kg)  BMI 33.27 kg/m2  SpO2 96% Physical Exam  Constitutional: She appears well-developed and well-nourished.  HENT:  Head: Normocephalic and atraumatic.  Right Ear: External ear normal.  Left Ear: External ear normal.  Mucous membranes are dry Right pupil is mid to large and not reactive Left pupil is 3-4 mm and reactive Patient states she had recent eye surgery on right pupil and had drops placed.  Eyes:  Conjunctivae are pale Extraocular movements are intact C pupillary exam above.  Neck: Normal range of motion. Neck supple.  Cardiovascular: Normal rate, regular rhythm, normal heart sounds and intact distal pulses.   Pulmonary/Chest: Effort normal and breath sounds normal.  Abdominal: Soft. Bowel sounds are normal.  Musculoskeletal: Normal range of motion. She exhibits no edema or tenderness.  Neurological: She is alert. She has normal reflexes. She displays normal reflexes. No cranial nerve deficit. She exhibits normal muscle tone. Coordination normal.  Skin: Skin is warm and dry. There is pallor.  Psychiatric: She has a normal mood and affect.  Nursing note reviewed.   ED Course  Procedures (including  critical care time) Labs Review Labs Reviewed  CBC WITH DIFFERENTIAL/PLATELET  COMPREHENSIVE METABOLIC PANEL  URINALYSIS, ROUTINE W REFLEX MICROSCOPIC  LIPASE, BLOOD  I-STAT TROPOININ, ED  I-STAT CG4 LACTIC ACID, ED    Imaging Review Dg Chest Port 1 View  02/10/2015   CLINICAL DATA:  Acute onset of weakness. History of right ventricular failure and hypertension.  EXAM: PORTABLE CHEST - 1 VIEW  COMPARISON:  05/30/2014  FINDINGS: The heart is mildly enlarged. There is tortuosity and mild ectasia of the thoracic aorta. There are chronic emphysematous and bronchitic lung changes but no acute overlying pulmonary process. No pleural effusion. The bony thorax is intact.  IMPRESSION: Mild cardiac enlargement.  Chronic lung changes but no acute overlying pulmonary process.   Electronically Signed   By: Marijo Sanes  M.D.   On: 02/10/2015 13:25     EKG Interpretation   Date/Time:  Saturday February 10 2015 12:36:32 EDT Ventricular Rate:  55 PR Interval:    QRS Duration: 142 QT Interval:  522 QTC Calculation: 499 R Axis:   46 Text Interpretation:  Undetermined rhythm Indeterminate axis Right bundle  branch block Anterior infarct , age undetermined Abnormal ECG Confirmed by  Kaidan Spengler MD, Andee Poles [phone]) on 02/10/2015 1:07:46 PM       Initial vital signs are noted to have a low heart rate at 45 and blood pressure at 87/69, however on my evaluation her heart rate has increased to the 60s and blood pressure has increased to 443 systolically.  MDM   Final diagnoses:  Weak  Sepsis, due to unspecified organism  Elevated troponin  Abnormal EKG     Filed Vitals:   02/10/15 1345  BP: 121/70  Pulse: 61  Temp:   Resp: 17   2:08 PM Patient continues to have a regular rate and bp normal. EKG continues to have t wave inversion and troponin elevated at 0.36.  Lactic acid also elevated but bp has remained normal.  NO evidence of infiltrate on cxr.  Patient now with bp 90/50.  MAP 63 currently. 30  cc/kg bolus ordered.    ED Sepsis - Repeat Assessment   Performed at:    February 10, 2015, 2:24 PM   Last Vitals:    Blood pressure 121/70, pulse 61, temperature 97.7 F (36.5 C), temperature source Oral, resp. rate 17, height 5\' 6"  (1.676 m), weight 206 lb (93.441 kg), SpO2 95 %.  Heart:      rrr  Lungs:     cta  Capillary Refill:   <2 sec  Peripheral Pulse (include location): Radial intact   Skin (include color):   Pale   WEakness nausea vomiitng   2:30 PM Discussed with Dr. Titus Mould - he will be to see.  Fluid bolus ensuing.  1- weakness 2- elevated lactic acid- 30cc/kg ordered and empiric antibiotics for unknown source ordered.  3- abnormal ekg with elevated troponin- no chest pain - discussed with Dr.Croituru and he will see in consult.  Dr. Titus Mould saw and at this time the repeat lactic acid is 2.18 with a downward trend. He advises admission to floor bed. I discussed patient's care with Dr. Wendee Beavers and will put temporary admission orders.  Pattricia Boss, MD 02/10/15 [phone]

## 2015-02-10 NOTE — ED Notes (Signed)
Pt and husband states that " heart is not right" pt is very vague with c/o's --- states "I don't think I am having chest pain" pt has been treated for a sinus infection.  Pt is pale, clammy, with dry heaves on arrival to room. Dr. Jeanell Sparrow at bedside.

## 2015-02-10 NOTE — Progress Notes (Signed)
ANTIBIOTIC CONSULT NOTE - INITIAL  Pharmacy Consult for vancomycin + aztreonam + levaquin Indication: rule out sepsis  Allergies  Allergen Reactions  . Penicillins Hives  . Sulfonamide Derivatives Hives  . Adhesive [Tape] Other (See Comments)    redness  . Prednisone Other (See Comments)    Causes insomnia    Patient Measurements: Height: 5\' 6"  (167.6 cm) Weight: 206 lb (93.441 kg) IBW/kg (Calculated) : 59.3 Adjusted Body Weight:   Vital Signs: Temp: 97.7 F (36.5 C) (04/16 1241) Temp Source: Oral (04/16 1241) BP: 121/70 mmHg (04/16 1345) Pulse Rate: 61 (04/16 1345) Intake/Output from previous day:   Intake/Output from this shift: Total I/O In: -  Out: 75 [Urine:75]  Labs:  Recent Labs  02/10/15 1106  WBC 10.4  HGB 14.2  PLT 192  CREATININE 1.05   Estimated Creatinine Clearance: 50 mL/min (by C-G formula based on Cr of 1.05). No results for input(s): VANCOTROUGH, VANCOPEAK, VANCORANDOM, GENTTROUGH, GENTPEAK, GENTRANDOM, TOBRATROUGH, TOBRAPEAK, TOBRARND, AMIKACINPEAK, AMIKACINTROU, AMIKACIN in the last 72 hours.   Microbiology: No results found for this or any previous visit (from the past 720 hour(s)).  Medical History: Past Medical History  Diagnosis Date  . Osteopenia   . Hyperlipidemia   . Hypertension   . PSVT (paroxysmal supraventricular tachycardia)   . Spinal stenosis   . Overactive bladder     urge incontinence  . Pulmonary emboli   . Pulmonary hypertension   . Right ventricular failure   . Arthritis     knee  . Anemia   . Diverticulosis   . Sleep apnea     CPAP  . Hx of colonic polyps 10/24/2014    Medications:  Anti-infectives    Start     Dose/Rate Route Frequency Ordered Stop   02/11/15 1500  levofloxacin (LEVAQUIN) IVPB 750 mg     750 mg 100 mL/hr over 90 Minutes Intravenous Every 24 hours 02/10/15 1433     02/11/15 0600  vancomycin (VANCOCIN) IVPB 750 mg/150 ml premix     750 mg 150 mL/hr over 60 Minutes Intravenous Every  12 hours 02/10/15 1433     02/10/15 2300  aztreonam (AZACTAM) 1 g in dextrose 5 % 50 mL IVPB     1 g 100 mL/hr over 30 Minutes Intravenous Every 8 hours 02/10/15 1433     02/10/15 1430  levofloxacin (LEVAQUIN) IVPB 750 mg     750 mg 100 mL/hr over 90 Minutes Intravenous  Once 02/10/15 1423     02/10/15 1430  aztreonam (AZACTAM) 2 g in dextrose 5 % 50 mL IVPB     2 g 100 mL/hr over 30 Minutes Intravenous  Once 02/10/15 1423     02/10/15 1430  vancomycin (VANCOCIN) IVPB 1000 mg/200 mL premix  Status:  Discontinued     1,000 mg 200 mL/hr over 60 Minutes Intravenous  Once 02/10/15 1423 02/10/15 1424   02/10/15 1430  vancomycin (VANCOCIN) 2,000 mg in sodium chloride 0.9 % 500 mL IVPB     2,000 mg 250 mL/hr over 120 Minutes Intravenous STAT 02/10/15 1424 02/11/15 1430     Assessment: 14 yof presented to the ED with an irregular heart beat. Now to start empiric broad spectrum antibiotics with vancomycin + aztreonam + levaquin for possible sepsis. Pt is afebrile and WBC is WNL but lactic acid is elevated at 3.71.   Vanc 4/16>> Levaquin 4/16>> Aztreonam 4/16>>  Goal of Therapy:  Vancomycin trough level 15-20 mcg/ml  Plan:  - Vancomycin 2gm IV  x 1 then 750mg  IV Q12H - Aztreonam 2gm IV x 1 then 1gm IV Q8H - Levaquin 750mg  IV Q24H - F/u renal fxn, C&S, clinical status and trough at [place], Rande Lawman 02/10/2015,2:33 PM

## 2015-02-10 NOTE — H&P (Signed)
[facility] History and Physical  [name] FBP:[id number] DOB: March 29, 1934 DOA: 02/10/2015  Referring physician: Dr. Jeanell Sparrow PCP: Loura Pardon, MD   Chief Complaint: Cough, sob  HPI: [name] is a 81 y.o. female  With history of paroxysmal SVT, essential hypertension, hypercholesterolemia, history of DVT and PE, and moderate pulmonary hypertension. Presenting to the hospital after developing shortness of breath which has gotten worse within the last 3 or 4 days. Shortness of breath has been associated with cough. This started reportedly 3-4 weeks ago of which patient took azithromycin. Initially condition improved on the azithromycin but she reports over the last 3 or 4 days after finishing her antibiotic therapy her condition has gotten worse. On evaluation in the ED patient was found to have abnormal EKG as well as elevated troponin. Was not complaining of chest discomfort on my exam.  Subsequently cardiology, pulmonology, and medical services were consulted for further evaluation recommendations.   Review of Systems:  Constitutional:  No weight loss, night sweats, Fevers, chills, fatigue.  HEENT:  No headaches, Difficulty swallowing,Tooth/dental problems,Sore throat,  No sneezing, itching, ear ache, nasal congestion, post nasal drip,  Cardio-vascular:  No chest pain, Orthopnea, PND, swelling in lower extremities, anasarca, dizziness, palpitations  GI:  No heartburn, indigestion, abdominal pain, nausea, vomiting, diarrhea, change in bowel habits, loss of appetite  Resp:  + shortness of breath with exertion or at rest. No excess mucus, no productive cough, + non-productive cough, No coughing up of blood.No change in color of mucus.No wheezing.No chest wall deformity  Skin:  no rash or lesions.  GU:  no dysuria, change in color of urine, no urgency or frequency. No flank pain.  Musculoskeletal:  No joint pain or swelling. No decreased range of motion. No back pain.  Psych:   No change in mood or affect. No depression or anxiety. No memory loss.   Past Medical History  Diagnosis Date  . Osteopenia   . Hyperlipidemia   . Hypertension   . PSVT (paroxysmal supraventricular tachycardia)   . Spinal stenosis   . Overactive bladder     urge incontinence  . Pulmonary emboli   . Pulmonary hypertension   . Right ventricular failure   . Arthritis     knee  . Anemia   . Diverticulosis   . Sleep apnea     CPAP  . Hx of colonic polyps 10/24/2014   Past Surgical History  Procedure Laterality Date  . Cataract extraction Bilateral 2008  . Bladder suspension  1993  . Total knee arthroplasty Left 2000  . Total knee revision  01/12/2012    Procedure: TOTAL KNEE REVISION;  Surgeon: Kerin Salen, MD;  Location: [place];  Service: Orthopedics;  Laterality: Left;  DEPUY/ LCS , HAND SET  . Trigger finger release  01/12/2012    Procedure: RELEASE TRIGGER FINGER/A-1 PULLEY;  Surgeon: Kerin Salen, MD;  Location: [place];  Service: Orthopedics;  Laterality: Right;  . Cholecystectomy    . Incisional hernia repair    . Total knee arthroplasty Left 10/29/2012    at [facility]  . Total knee arthroplasty Right 03/09/2014  . Colonoscopy    . Right heart catheterization N/A 07/04/2014    Procedure: RIGHT HEART CATH;  Surgeon: Jolaine Artist, MD;  Location: [facility] CATH LAB;  Service: Cardiovascular;  Laterality: N/A;  . Tonsillectomy and adenoidectomy    . Colonoscopy N/A 10/24/2014    Procedure: COLONOSCOPY;  Surgeon: Gatha Mayer, MD;  Location: [facility];  Service: Endoscopy;  Laterality: N/A;   Social History:  reports that she has never smoked. She has never used smokeless tobacco. She reports that she does not drink alcohol or use illicit drugs.  Allergies  Allergen Reactions  . Penicillins Hives  . Sulfonamide Derivatives Hives  . Adhesive [Tape] Other (See Comments)    redness  . Prednisone Other (See Comments)    Causes insomnia    Family History  Problem Relation  Age of Onset  . Breast cancer Mother   . Heart disease Father   . Diabetes Father   . Hypertension Father   . Uterine cancer Sister     Prior to Admission medications   Medication Sig Start Date End Date Taking? Authorizing Provider  benzonatate (TESSALON) 200 MG capsule Take 1 capsule (200 mg total) by mouth 2 (two) times daily as needed for cough. 02/01/15  Yes Amy Cletis Athens, MD  Calcium Carb-Cholecalciferol (CALCIUM 600/VITAMIN D3) 600-800 MG-UNIT TABS Take 1 tablet by mouth 2 (two) times daily.   Yes Historical Provider, MD  diphenhydrAMINE (BENADRYL) 25 mg capsule Take 25 mg by mouth every morning.    Yes Historical Provider, MD  metoprolol (LOPRESSOR) 50 MG tablet Take 50 mg by mouth daily.   Yes Historical Provider, MD  Multiple Vitamin (MULTIVITAMIN WITH MINERALS) TABS tablet Take 1 tablet by mouth daily.   Yes Historical Provider, MD  OXYGEN Inhale 2 L/min into the lungs at bedtime.   Yes Historical Provider, MD  polyethylene glycol (MIRALAX / GLYCOLAX) packet Take 8.5 g by mouth daily.    Yes Historical Provider, MD  verapamil (CALAN-SR) 120 MG CR tablet TAKE 1 TABLET AT BEDTIME 11/27/14  Yes Thompson Grayer, MD  vitamin C (ASCORBIC ACID) 500 MG tablet Take 500 mg by mouth daily.   Yes Historical Provider, MD  azithromycin (ZITHROMAX) 250 MG tablet 2 tab po x 1 day then 1 tab po daily Patient not taking: Reported on 02/10/2015 02/01/15   Jinny Sanders, MD   Physical Exam: Filed Vitals:   02/10/15 1300 02/10/15 1315 02/10/15 1330 02/10/15 1345  BP: 100/66 116/65 124/84 121/70  Pulse: 61 60 56 61  Temp:      TempSrc:      Resp: 17 27 15 17   Height:      Weight:      SpO2: 93% 96% 94% 95%    Wt Readings from Last 3 Encounters:  02/10/15 93.441 kg (206 lb)  02/01/15 93.668 kg (206 lb 8 oz)  01/23/15 94.008 kg (207 lb 4 oz)    General:  Appears calm and comfortable Eyes: PERRL, normal lids, irises & conjunctiva ENT: grossly normal hearing, lips & tongue, dry mucous  membranes Neck: no LAD, masses or thyromegaly Cardiovascular: RRR, no m/r/g. No LE edema. Telemetry: SR, no arrhythmias  Respiratory: Rhales BL worse at bases, no wheezes, equal chest rise Abdomen: soft, nt, nd Skin: no rash or induration seen on limited exam Musculoskeletal: grossly normal tone BUE/BLE Psychiatric: grossly normal mood and affect, speech fluent and appropriate Neurologic: grossly non-focal.          Labs on Admission:  Basic Metabolic Panel:  Recent Labs Lab 02/10/15 1106  NA 134*  K 4.5  CL 97  CO2 24  GLUCOSE 151*  BUN 23  CREATININE 1.05  CALCIUM 10.1   Liver Function Tests:  Recent Labs Lab 02/10/15 1106  AST 37  ALT 19  ALKPHOS 66  BILITOT 1.1  PROT 7.1  ALBUMIN 3.2*    Recent Labs Lab 02/10/15 1106  LIPASE 23   No results for input(s): AMMONIA in the last 168 hours. CBC:  Recent Labs Lab 02/10/15 1106  WBC 10.4  NEUTROABS 7.4  HGB 14.2  HCT 44.5  MCV 92.1  PLT 192   Cardiac Enzymes: No results for input(s): CKTOTAL, CKMB, CKMBINDEX, TROPONINI in the last 168 hours.  BNP (last 3 results) No results for input(s): BNP in the last 8760 hours.  ProBNP (last 3 results) No results for input(s): PROBNP in the last 8760 hours.  CBG: No results for input(s): GLUCAP in the last 168 hours.  Radiological Exams on Admission: Dg Chest Port 1 View  02/10/2015   CLINICAL DATA:  Acute onset of weakness. History of right ventricular failure and hypertension.  EXAM: PORTABLE CHEST - 1 VIEW  COMPARISON:  05/30/2014  FINDINGS: The heart is mildly enlarged. There is tortuosity and mild ectasia of the thoracic aorta. There are chronic emphysematous and bronchitic lung changes but no acute overlying pulmonary process. No pleural effusion. The bony thorax is intact.  IMPRESSION: Mild cardiac enlargement.  Chronic lung changes but no acute overlying pulmonary process.   Electronically Signed   By: Marijo Sanes M.D.   On: 02/10/2015 13:25     EKG: Independently reviewed. Junctional rhythm prolonged QT/QTc interval  Assessment/Plan Active Problems: PNA (pneumonia) - Given allergies will treat with Aztreonam and vancomycin. QT/QTc prolonged as such will avoid levaquin. Patient reportedly failed azithromycin as outpatient - PNA routine order set placed - monitor in telemetry - evaluated by Pulmonology their consult note reviewed.    Essential HTN - Patient has had soft blood pressures as such will hold antihypertensive regimen. Defer holding B blocker to cardiology  Elevated troponin/PSVT - cardiology consulted and will manage - Patient is currently not complaining of chest pain on my exam.  Hyponatremia - Will reassess next am - provide IVF rehydration with normal saline.  Lactic Acidosis - Etiology unclear but most likely due to infectious etiology - provide IVF rehydration and IV antibiotics.   Code Status: full DVT Prophylaxis: heparin Family Communication: d/c patient and family at bedside Disposition Plan: Telemetry monitoring  Time spent: > 65 minutes  Velvet Bathe [facility] Pager [phone]

## 2015-02-10 NOTE — ED Notes (Signed)
I-Stat Troponin results reported to Dr.Ray. ED-lab

## 2015-02-10 NOTE — ED Notes (Signed)
Lab results reported to Dr.Ray. 

## 2015-02-10 NOTE — ED Notes (Signed)
Critical Lactic Acid results reported to Dr.Ray. ED-Lab

## 2015-02-11 DIAGNOSIS — I471 Supraventricular tachycardia: Secondary | ICD-10-CM

## 2015-02-11 LAB — CBC
HEMATOCRIT: 42.7 % (ref 36.0–46.0)
Hemoglobin: 13.4 g/dL (ref 12.0–15.0)
MCH: 29.1 pg (ref 26.0–34.0)
MCHC: 31.4 g/dL (ref 30.0–36.0)
MCV: 92.6 fL (ref 78.0–100.0)
PLATELETS: 167 10*3/uL (ref 150–400)
RBC: 4.61 MIL/uL (ref 3.87–5.11)
RDW: 13.9 % (ref 11.5–15.5)
WBC: 7.9 10*3/uL (ref 4.0–10.5)

## 2015-02-11 LAB — HIV ANTIBODY (ROUTINE TESTING W REFLEX): HIV SCREEN 4TH GENERATION: NONREACTIVE

## 2015-02-11 LAB — TROPONIN I: Troponin I: 0.32 ng/mL — ABNORMAL HIGH (ref <0.031)

## 2015-02-11 MED ORDER — METOPROLOL TARTRATE 12.5 MG HALF TABLET
12.5000 mg | ORAL_TABLET | Freq: Two times a day (BID) | ORAL | Status: DC
  Administered 2015-02-11 – 2015-02-12 (×3): 12.5 mg via ORAL
  Filled 2015-02-11 (×4): qty 1

## 2015-02-11 MED ORDER — ZOLPIDEM TARTRATE 5 MG PO TABS: 5.0000 mg | ORAL_TABLET | Freq: Once | ORAL | Status: DC

## 2015-02-11 NOTE — Progress Notes (Signed)
[facility] PROGRESS NOTE  [name] [id number]:[id number] DOB: 01-27-1934 DOA: 02/10/2015 PCP: [name] Pardon, MD  Assessment/Plan: Active Problems: PNA (pneumonia) - Given allergies will treat with Aztreonam and vancomycin. QT/QTc prolonged as such will avoid levaquin. Patient reportedly failed azithromycin as outpatient - Legionella and strep urinary antigen pending  Essential HTN - Holding blood pressure medication given soft BP's  Elevated troponin/PSVT - cardiology Managing. Plan is for further evaluation with nuclear study for risk stratification - Patient is currently not complaining of chest pain on my exam.  Prolonged QT interval - Question is whether this is related to recent azithromycin use - Will avoid any medications that will prolong QT interval - Magnesium and phosphorus levels within normal limits  Hyponatremia - Will reassess next am - provide IVF rehydration with normal saline.  Lactic Acidosis - Has resolved with IVF's  Code Status: full Family Communication:  Discussed directly with patient Disposition Plan: Pending further evaluation recommendations from cardiologist   Consultants:  Cardiology  Procedures:  None  Antibiotics:  Aztreonam and vancomycin  HPI/Subjective: Patient has no new complaints. She states she feels better  Objective: Filed Vitals:   02/11/15 1540  BP: 113/51  Pulse: 71  Temp: 98 F (36.7 C)  Resp:     Intake/Output Summary (Last 24 hours) at 02/11/15 1604 Last data filed at 02/11/15 0635  Gross per 24 hour  Intake 4802.5 ml  Output   1450 ml  Net 3352.5 ml   Filed Weights   02/10/15 1241 02/10/15 1702 02/11/15 0518  Weight: 93.441 kg (206 lb) 97.4 kg (214 lb 11.7 oz) 96.4 kg (212 lb 8.4 oz)    Exam:   General:  Patient in no acute distress, alert and awake  Cardiovascular: S1 and S2 present, no rubs  Respiratory: No increased work of breathing, clear to auscultation bilaterally, equal chest  rise  Abdomen: Soft, nondistended, nontender  Musculoskeletal: No cyanosis or clubbing on limited exam   Data Reviewed: Basic Metabolic Panel:  Recent Labs Lab 02/10/15 1106 02/10/15 1614  NA 134*  --   K 4.5  --   CL 97  --   CO2 24  --   GLUCOSE 151*  --   BUN 23  --   CREATININE 1.05  --   CALCIUM 10.1  --   MG  --  1.7  PHOS  --  3.4   Liver Function Tests:  Recent Labs Lab 02/10/15 1106  AST 37  ALT 19  ALKPHOS 66  BILITOT 1.1  PROT 7.1  ALBUMIN 3.2*    Recent Labs Lab 02/10/15 1106  LIPASE 23   No results for input(s): AMMONIA in the last 168 hours. CBC:  Recent Labs Lab 02/10/15 1106 02/11/15 1213  WBC 10.4 7.9  NEUTROABS 7.4  --   HGB 14.2 13.4  HCT 44.5 42.7  MCV 92.1 92.6  PLT 192 167   Cardiac Enzymes:  Recent Labs Lab 02/10/15 1614 02/10/15 2205 02/11/15 0352  TROPONINI 0.48* 0.35* 0.32*   BNP (last 3 results) No results for input(s): BNP in the last 8760 hours.  ProBNP (last 3 results) No results for input(s): PROBNP in the last 8760 hours.  CBG: No results for input(s): GLUCAP in the last 168 hours.  No results found for this or any previous visit (from the past 240 hour(s)).   Studies: Dg Chest Port 1 View  02/10/2015   CLINICAL DATA:  Acute onset of weakness. History of right ventricular failure and hypertension.  EXAM: PORTABLE CHEST - 1 VIEW  COMPARISON:  05/30/2014  FINDINGS: The heart is mildly enlarged. There is tortuosity and mild ectasia of the thoracic aorta. There are chronic emphysematous and bronchitic lung changes but no acute overlying pulmonary process. No pleural effusion. The bony thorax is intact.  IMPRESSION: Mild cardiac enlargement.  Chronic lung changes but no acute overlying pulmonary process.   Electronically Signed   By: Marijo Sanes M.D.   On: 02/10/2015 13:25    Scheduled Meds: . aztreonam  2 g Intravenous 3 times per day  . heparin  5,000 Units Subcutaneous 3 times per day  . metoprolol  tartrate  12.5 mg Oral BID  . vancomycin  750 mg Intravenous Q12H   Continuous Infusions:    Time spent: > 35 minutes    [name]  [facility] Pager [phone] If 7PM-7AM, please contact night-coverage at [email or web address], password [facility] 02/11/2015, 4:04 PM  LOS: 1 day

## 2015-02-11 NOTE — Progress Notes (Signed)
    Subjective:  Denies CP or dyspnea   Objective:  Filed Vitals:   02/10/15 1621 02/10/15 1702 02/10/15 2020 02/11/15 0518  BP: 119/69 136/84 127/74 134/82  Pulse: 58 59 69 72  Temp:  97.7 F (36.5 C) 97 F (36.1 C) 97.6 F (36.4 C)  TempSrc:  Oral Oral Oral  Resp: 16  18 18   Height:  5\' 6"  (1.676 m)    Weight:  214 lb 11.7 oz (97.4 kg)  212 lb 8.4 oz (96.4 kg)  SpO2: 100% 97% 98% 98%    Intake/Output from previous day:  Intake/Output Summary (Last 24 hours) at 02/11/15 0834 Last data filed at 02/11/15 9292  Gross per 24 hour  Intake 4802.5 ml  Output   1525 ml  Net 3277.5 ml    Physical Exam: Physical exam: Well-developed well-nourished in no acute distress.  Skin is warm and dry.  HEENT is normal.  Neck is supple.  Chest minimal basilar crackles Cardiovascular exam is regular rate and rhythm.  Abdominal exam nontender or distended. No masses palpated. Extremities show no edema. neuro grossly intact    Lab Results: Basic Metabolic Panel:  Recent Labs  02/10/15 1106 02/10/15 1614  NA 134*  --   K 4.5  --   CL 97  --   CO2 24  --   GLUCOSE 151*  --   BUN 23  --   CREATININE 1.05  --   CALCIUM 10.1  --   MG  --  1.7  PHOS  --  3.4   CBC:  Recent Labs  02/10/15 1106  WBC 10.4  NEUTROABS 7.4  HGB 14.2  HCT 44.5  MCV 92.1  PLT 192   Cardiac Enzymes:  Recent Labs  02/10/15 1614 02/10/15 2205 02/11/15 0352  TROPONINI 0.48* 0.35* 0.32*     Assessment/Plan:  1 Elevated troponin-no chest pain; as outlined by Dr Sallyanne Kuster, plan nuclear study for risk stratification; plan in AM; do not think minimal elevation is consistent with MI. 2 prolonged QT interval-question related to azithromycin. Will repeat electrocardiogram. 3 bradycardia-verapamil and metoprolol held. Heart rate improved. Resume lower dose metoprolol.  4 history of PAT-resume low-dose metoprolol. 5 pneumonia-antibiotics per primary care. 6 history of pulmonary  hypertension-repeat echocardiogram.  Kirk Ruths 02/11/2015, 8:34 AM

## 2015-02-12 ENCOUNTER — Inpatient Hospital Stay (HOSPITAL_COMMUNITY): Payer: Medicare Other

## 2015-02-12 DIAGNOSIS — R079 Chest pain, unspecified: Secondary | ICD-10-CM

## 2015-02-12 DIAGNOSIS — I4510 Unspecified right bundle-branch block: Secondary | ICD-10-CM

## 2015-02-12 DIAGNOSIS — G4733 Obstructive sleep apnea (adult) (pediatric): Secondary | ICD-10-CM

## 2015-02-12 DIAGNOSIS — I4581 Long QT syndrome: Secondary | ICD-10-CM

## 2015-02-12 DIAGNOSIS — I10 Essential (primary) hypertension: Secondary | ICD-10-CM

## 2015-02-12 DIAGNOSIS — I270 Primary pulmonary hypertension: Secondary | ICD-10-CM

## 2015-02-12 DIAGNOSIS — R9431 Abnormal electrocardiogram [ECG] [EKG]: Secondary | ICD-10-CM | POA: Diagnosis present

## 2015-02-12 DIAGNOSIS — Z86711 Personal history of pulmonary embolism: Secondary | ICD-10-CM | POA: Diagnosis present

## 2015-02-12 LAB — BASIC METABOLIC PANEL
ANION GAP: 9 (ref 5–15)
BUN: 15 mg/dL (ref 6–23)
CHLORIDE: 106 mmol/L (ref 96–112)
CO2: 25 mmol/L (ref 19–32)
Calcium: 8.6 mg/dL (ref 8.4–10.5)
Creatinine, Ser: 0.94 mg/dL (ref 0.50–1.10)
GFR calc Af Amer: 65 mL/min — ABNORMAL LOW (ref >90)
GFR calc non Af Amer: 56 mL/min — ABNORMAL LOW (ref >90)
Glucose, Bld: 122 mg/dL — ABNORMAL HIGH (ref 70–99)
POTASSIUM: 4.3 mmol/L (ref 3.5–5.1)
Sodium: 140 mmol/L (ref 135–145)

## 2015-02-12 LAB — CBC
HCT: 39.7 % (ref 36.0–46.0)
Hemoglobin: 12.3 g/dL (ref 12.0–15.0)
MCH: 28.8 pg (ref 26.0–34.0)
MCHC: 31 g/dL (ref 30.0–36.0)
MCV: 93 fL (ref 78.0–100.0)
PLATELETS: 181 10*3/uL (ref 150–400)
RBC: 4.27 MIL/uL (ref 3.87–5.11)
RDW: 14 % (ref 11.5–15.5)
WBC: 8.2 10*3/uL (ref 4.0–10.5)

## 2015-02-12 LAB — URINE CULTURE
Colony Count: NO GROWTH
Culture: NO GROWTH

## 2015-02-12 MED ORDER — DRONABINOL 2.5 MG PO CAPS
2.5000 mg | ORAL_CAPSULE | Freq: Once | ORAL | Status: AC | PRN
  Administered 2015-02-12: 2.5 mg via ORAL
  Filled 2015-02-12: qty 1

## 2015-02-12 MED ORDER — REGADENOSON 0.4 MG/5ML IV SOLN
INTRAVENOUS | Status: AC
  Administered 2015-02-12: 0.4 mg via INTRAVENOUS
  Filled 2015-02-12: qty 5

## 2015-02-12 MED ORDER — TECHNETIUM TC 99M SESTAMIBI - CARDIOLITE
30.0000 | Freq: Once | INTRAVENOUS | Status: AC | PRN
Start: 2015-02-12 — End: 2015-02-12
  Administered 2015-02-12: 30 via INTRAVENOUS

## 2015-02-12 MED ORDER — CEFUROXIME AXETIL 500 MG PO TABS
500.0000 mg | ORAL_TABLET | Freq: Two times a day (BID) | ORAL | Status: DC
  Administered 2015-02-12: 500 mg via ORAL
  Filled 2015-02-12 (×4): qty 1

## 2015-02-12 MED ORDER — REGADENOSON 0.4 MG/5ML IV SOLN
0.4000 mg | Freq: Once | INTRAVENOUS | Status: AC
Start: 2015-02-12 — End: 2015-02-12
  Administered 2015-02-12: 0.4 mg via INTRAVENOUS
  Filled 2015-02-12: qty 5

## 2015-02-12 MED ORDER — TECHNETIUM TC 99M SESTAMIBI GENERIC - CARDIOLITE
10.0000 | Freq: Once | INTRAVENOUS | Status: AC | PRN
  Administered 2015-02-12: 10 via INTRAVENOUS

## 2015-02-12 MED ORDER — METOPROLOL TARTRATE 25 MG PO TABS
25.0000 mg | ORAL_TABLET | Freq: Two times a day (BID) | ORAL | Status: DC
  Administered 2015-02-12 – 2015-02-13 (×2): 25 mg via ORAL
  Filled 2015-02-12 (×3): qty 1

## 2015-02-12 NOTE — Care Management Note (Unsigned)
    Page 1 of 1   02/13/2015     3:45:36 PM CARE MANAGEMENT NOTE 02/13/2015  Patient:  [name], [name]   Account Number:  [id number]  Date Initiated:  02/12/2015  Documentation initiated by:  Riely Baskett  Subjective/Objective Assessment:   Pt adm on 02/10/15 with N/V, PNA, pos trop.  PTA, pt resides at home with husband.     Action/Plan:   Will follow for home needs as pt progresses.   Anticipated DC Date:  02/14/2015   Anticipated DC Plan:  [place]  CM consult      Choice offered to / List presented to:             Status of service:  In process, will continue to follow Medicare Important Message given?  YES (If response is "NO", the following Medicare IM given date fields will be blank) Date Medicare IM given:  02/13/2015 Medicare IM given by:  Oluwaseun Bruyere Date Additional Medicare IM given:   Additional Medicare IM given by:    Discharge Disposition:    Per UR Regulation:  Reviewed for med. necessity/level of care/duration of stay  If discussed at [place] of Stay Meetings, dates discussed:    Comments:

## 2015-02-12 NOTE — Progress Notes (Signed)
  Echocardiogram 2D Echocardiogram has been performed.  [name] 02/12/2015, 4:02 PM

## 2015-02-12 NOTE — Progress Notes (Addendum)
Subjective:  Cough  Objective:  Vital Signs in the last 24 hours: Temp:  [97.9 F (36.6 C)-98 F (36.7 C)] 98 F (36.7 C) (04/18 2353) Pulse Rate:  [71-122] 77 (04/18 0608) Resp:  [17-20] 17 (04/18 0608) BP: (113-135)/(51-78) 131/77 mmHg (04/18 0608) SpO2:  [92 %-97 %] 92 % (04/18 0608) Weight:  [214 lb 6.4 oz (97.251 kg)] 214 lb 6.4 oz (97.251 kg) (04/18 6144)  Intake/Output from previous day:  Intake/Output Summary (Last 24 hours) at 02/12/15 0905 Last data filed at 02/12/15 0808  Gross per 24 hour  Intake    930 ml  Output    900 ml  Net     30 ml   . aztreonam  2 g Intravenous 3 times per day  . heparin  5,000 Units Subcutaneous 3 times per day  . metoprolol tartrate  12.5 mg Oral BID  . vancomycin  750 mg Intravenous Q12H  . zolpidem  5 mg Oral Once    Physical Exam: General appearance: alert, cooperative and no distress Lungs: Mild basilar crackles Heart: irregularly irregular rhythm and no MR Extremities: 1+ LEE Pulses: 2+ and symmetric Skin: Warm and dry Neurologic: Grossly normal   Rate: 87  Rhythm: SR, atrial tach.  Lab Results:  Recent Labs  02/11/15 1213 02/12/15 0421  WBC 7.9 8.2  HGB 13.4 12.3  PLT 167 181    Recent Labs  02/10/15 1106 02/12/15 0421  NA 134* 140  K 4.5 4.3  CL 97 106  CO2 24 25  GLUCOSE 151* 122*  BUN 23 15  CREATININE 1.05 0.94    Recent Labs  02/10/15 2205 02/11/15 0352  TROPONINI 0.35* 0.32*   No results for input(s): INR in the last 72 hours.      Assessment/Plan:  81 y.o. female with a PMH of PAT, OSA versus obesity hypoventilation syndrome as well as pulmonary hypertension at least in part secondary to previous venous thromboembolic disease (bilateral pulmonary embolism following bilateral knee replacement), with subsequent right heart failure. Echocardiography has estimated PA pressures as high as 73 mmHg but invasive evaluation right heart catheterization December 2015 showed a PA pressure  43/12 (mean 25) mm Hg. however right ventricular strain has been noted both on echocardiography and CT angiography of the chest. She no longer takes anticoagulation since December 2015.   Pt admitted 02/10/15 with nasuea and weakness after a course of Azithromycin as an OP for URI. On admission she had am abnormal EKG with prolonged QTc, TWI, and new RBBB. Her Troponin was also slightly elevated. Since admission she has had transient junctional rhythm and her verapamil was stopped, lopressor decreased.   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  In and out of sinus rhythm and atrial tach?Carlton Adam completed without complications.  Giving lopressor in nuc med.  Will likely need to increase to 25mg .    Corliss Parish Beeper [phone]  02/12/2015, 9:05 AM   Patient seen and examined. Agree with assessment and plan. Appears to be in AF presently with rates 100 - 115.  Will  increase metoprolol to 25 mg bid. (previously was on higher dose bb and verapamil).  F/u ECG today. Suspect demand ischemia with mild troponin increase, but will f/u. Await nuclear images.   Troy Sine, MD, [facility] 02/12/2015 1:09 PM

## 2015-02-12 NOTE — Clinical Documentation Improvement (Signed)
Documentation Clarification  "Possible Sepsis" documented by ED physician.   Patient evaluated by Dr. Titus Mould 02/09/14 at 3:40 pm with the following findings - "Lactic Acidosis- related to Hypovolemia, r/o underlying cardiac hypo perfusion contribution.  repeat la done in ed just back and is clearing well even after start of fluids - her lowest MAP in ED 69, she is mentating well, no resp distress -would continue the 30 cc/kg, would repeat perfusion examination , would give empiric abx"  Please clarify and document if the diagnosis of "Possible Sepsis" has been:   - Ruled Out  - Confirmed and Present on Admission  - Unable to clinically determine  Thank you, Erling Conte ,RN Clinical Documentation Specialist:  [phone] [facility]

## 2015-02-12 NOTE — Progress Notes (Signed)
[facility] PROGRESS NOTE  [name] ZOX:[id number] DOB: Nov 06, 1933 DOA: 02/10/2015 PCP: Loura Pardon, MD  Assessment/Plan: Active Problems: PNA (pneumonia) - Given allergies will treat with Aztreonam and vancomycin. QT/QTc prolonged as such will avoid levaquin. Patient reportedly failed azithromycin as outpatient. - Legionella and strep urinary antigen pending  Essential HTN - Holding blood pressure medication given soft BP's  Elevated troponin/PSVT - Cardiology Managing and working up.  Prolonged QT interval - Question is whether this is related to recent azithromycin use - Will avoid any medications that will prolong QT interval - Magnesium and phosphorus levels within normal limits  Hyponatremia - Will reassess next am - resolved after normal saline administration  Lactic Acidosis - Has resolved with IVF's  Code Status: full Family Communication:  Discussed directly with patient Disposition Plan: Pending further evaluation recommendations from cardiologist   Consultants:  Cardiology  Procedures:  None  Antibiotics:  Aztreonam and vancomycin  HPI/Subjective: Patient has no new complaints. Some nausea reported today.  Objective: Filed Vitals:   02/12/15 1410  BP: 119/87  Pulse: 100  Temp: 98.3 F (36.8 C)  Resp: 18    Intake/Output Summary (Last 24 hours) at 02/12/15 1415 Last data filed at 02/12/15 1348  Gross per 24 hour  Intake    680 ml  Output    900 ml  Net   -220 ml   Filed Weights   02/10/15 1702 02/11/15 0518 02/12/15 8119  Weight: 97.4 kg (214 lb 11.7 oz) 96.4 kg (212 lb 8.4 oz) 97.251 kg (214 lb 6.4 oz)    Exam:   General:  Patient in no acute distress, alert and awake  Cardiovascular: S1 and S2 present, no rubs  Respiratory: No increased work of breathing, clear to auscultation bilaterally, equal chest rise  Abdomen: Soft, nondistended, nontender  Musculoskeletal: No cyanosis or clubbing on limited exam   Data  Reviewed: Basic Metabolic Panel:  Recent Labs Lab 02/10/15 1106 02/10/15 1614 02/12/15 0421  NA 134*  --  140  K 4.5  --  4.3  CL 97  --  106  CO2 24  --  25  GLUCOSE 151*  --  122*  BUN 23  --  15  CREATININE 1.05  --  0.94  CALCIUM 10.1  --  8.6  MG  --  1.7  --   PHOS  --  3.4  --    Liver Function Tests:  Recent Labs Lab 02/10/15 1106  AST 37  ALT 19  ALKPHOS 66  BILITOT 1.1  PROT 7.1  ALBUMIN 3.2*    Recent Labs Lab 02/10/15 1106  LIPASE 23   No results for input(s): AMMONIA in the last 168 hours. CBC:  Recent Labs Lab 02/10/15 1106 02/11/15 1213 02/12/15 0421  WBC 10.4 7.9 8.2  NEUTROABS 7.4  --   --   HGB 14.2 13.4 12.3  HCT 44.5 42.7 39.7  MCV 92.1 92.6 93.0  PLT 192 167 181   Cardiac Enzymes:  Recent Labs Lab 02/10/15 1614 02/10/15 2205 02/11/15 0352  TROPONINI 0.48* 0.35* 0.32*   BNP (last 3 results) No results for input(s): BNP in the last 8760 hours.  ProBNP (last 3 results) No results for input(s): PROBNP in the last 8760 hours.  CBG: No results for input(s): GLUCAP in the last 168 hours.  Recent Results (from the past 240 hour(s))  Urine culture     Status: None   Collection Time: 02/10/15  1:03 PM  Result Value Ref  Range Status   Specimen Description URINE, CATHETERIZED  Final   Special Requests NONE  Final   Colony Count NO GROWTH Performed at [facility]   Final   Culture NO GROWTH Performed at [facility]   Final   Report Status 02/12/2015 FINAL  Final  Blood Culture (routine x 2)     Status: None (Preliminary result)   Collection Time: 02/10/15  2:40 PM  Result Value Ref Range Status   Specimen Description BLOOD RIGHT ANTECUBITAL  Final   Special Requests BOTTLES DRAWN AEROBIC AND ANAEROBIC 10MLS  Final   Culture   Final           BLOOD CULTURE RECEIVED NO GROWTH TO DATE CULTURE WILL BE HELD FOR 5 DAYS BEFORE ISSUING A FINAL NEGATIVE REPORT Performed at [facility]    Report  Status PENDING  Incomplete  Blood Culture (routine x 2)     Status: None (Preliminary result)   Collection Time: 02/10/15  2:45 PM  Result Value Ref Range Status   Specimen Description BLOOD LEFT ANTECUBITAL  Final   Special Requests BOTTLES DRAWN AEROBIC AND ANAEROBIC 5MLS  Final   Culture   Final           BLOOD CULTURE RECEIVED NO GROWTH TO DATE CULTURE WILL BE HELD FOR 5 DAYS BEFORE ISSUING A FINAL NEGATIVE REPORT Performed at [facility]    Report Status PENDING  Incomplete  Culture, blood (routine x 2) Call MD if unable to obtain prior to antibiotics being given     Status: None (Preliminary result)   Collection Time: 02/10/15  6:05 PM  Result Value Ref Range Status   Specimen Description BLOOD RIGHT ARM  Final   Special Requests BOTTLES DRAWN AEROBIC AND ANAEROBIC 5CC EA  Final   Culture   Final           BLOOD CULTURE RECEIVED NO GROWTH TO DATE CULTURE WILL BE HELD FOR 5 DAYS BEFORE ISSUING A FINAL NEGATIVE REPORT Performed at [facility]    Report Status PENDING  Incomplete  Culture, blood (routine x 2) Call MD if unable to obtain prior to antibiotics being given     Status: None (Preliminary result)   Collection Time: 02/10/15  6:15 PM  Result Value Ref Range Status   Specimen Description BLOOD RIGHT ARM  Final   Special Requests BOTTLES DRAWN AEROBIC AND ANAEROBIC B 4CC R 3CC  Final   Culture   Final           BLOOD CULTURE RECEIVED NO GROWTH TO DATE CULTURE WILL BE HELD FOR 5 DAYS BEFORE ISSUING A FINAL NEGATIVE REPORT Performed at [facility]    Report Status PENDING  Incomplete     Studies: No results found.  Scheduled Meds: . aztreonam  2 g Intravenous 3 times per day  . heparin  5,000 Units Subcutaneous 3 times per day  . metoprolol tartrate  25 mg Oral BID  . vancomycin  750 mg Intravenous Q12H  . zolpidem  5 mg Oral Once   Continuous Infusions:    Time spent: > 35 minutes    Velvet Bathe  [facility] Pager  [phone] If 7PM-7AM, please contact night-coverage at [email or web address], password [facility] 02/12/2015, 2:15 PM  LOS: 2 days

## 2015-02-13 DIAGNOSIS — I480 Paroxysmal atrial fibrillation: Secondary | ICD-10-CM

## 2015-02-13 LAB — BASIC METABOLIC PANEL
Anion gap: 9 (ref 5–15)
BUN: 19 mg/dL (ref 6–23)
CO2: 25 mmol/L (ref 19–32)
Calcium: 8.5 mg/dL (ref 8.4–10.5)
Chloride: 104 mmol/L (ref 96–112)
Creatinine, Ser: 0.94 mg/dL (ref 0.50–1.10)
GFR calc Af Amer: 65 mL/min — ABNORMAL LOW (ref >90)
GFR calc non Af Amer: 56 mL/min — ABNORMAL LOW (ref >90)
Glucose, Bld: 128 mg/dL — ABNORMAL HIGH (ref 70–99)
POTASSIUM: 4.3 mmol/L (ref 3.5–5.1)
Sodium: 138 mmol/L (ref 135–145)

## 2015-02-13 LAB — STREP PNEUMONIAE URINARY ANTIGEN: STREP PNEUMO URINARY ANTIGEN: NEGATIVE

## 2015-02-13 MED ORDER — APIXABAN 5 MG PO TABS
5.0000 mg | ORAL_TABLET | Freq: Two times a day (BID) | ORAL | Status: DC
  Administered 2015-02-13 – 2015-02-15 (×5): 5 mg via ORAL
  Filled 2015-02-13 (×6): qty 1

## 2015-02-13 MED ORDER — METOPROLOL TARTRATE 25 MG PO TABS
37.5000 mg | ORAL_TABLET | Freq: Two times a day (BID) | ORAL | Status: DC
  Administered 2015-02-13: 37.5 mg via ORAL
  Filled 2015-02-13 (×3): qty 1

## 2015-02-13 MED ORDER — CEFUROXIME AXETIL 500 MG PO TABS
500.0000 mg | ORAL_TABLET | Freq: Two times a day (BID) | ORAL | Status: DC
  Administered 2015-02-13 – 2015-02-15 (×6): 500 mg via ORAL
  Filled 2015-02-13 (×8): qty 1

## 2015-02-13 MED ORDER — DRONABINOL 2.5 MG PO CAPS
2.5000 mg | ORAL_CAPSULE | Freq: Two times a day (BID) | ORAL | Status: DC | PRN
Start: 2015-02-13 — End: 2015-02-15
  Administered 2015-02-13 – 2015-02-14 (×2): 2.5 mg via ORAL
  Filled 2015-02-13 (×2): qty 1

## 2015-02-13 NOTE — Progress Notes (Signed)
[facility] PROGRESS NOTE  LEXANI CORONA OZY:[id number] DOB: 1934-06-14 DOA: 02/10/2015 PCP: Loura Pardon, MD  Brief narrative: Patient is a 81 year old with history of paroxysmal SVT who presented to the ED with elevated troponin and diagnosis of pneumonia after chest x-ray was obtained. Cardiology consulted for elevated serum troponins and elevated heart rate. Currently being treated for pneumonia with cefuroxime as patient has tolerated third generation cephalosporin. Patient has prolonged QT interval as well as such avoiding any medications which may prolong this further  Assessment/Plan: Active Problems: PNA (pneumonia) - Given allergies will treat with Aztreonam and vancomycin. QT/QTc prolonged as such will avoid levaquin. Patient reportedly failed azithromycin as outpatient. - Legionella and strep urinary antigen pending  Essential HTN - Currently on B blocker. Relatively well controlled.  Elevated troponin/PSVT - Cardiology Managing and working up.  Prolonged QT interval - Question is whether this is related to recent azithromycin use - Will avoid any medications that will prolong QT interval - Magnesium and phosphorus levels within normal limits  Hyponatremia - Will reassess next am - resolved after normal saline administration  Lactic Acidosis - Has resolved with IVF's  Code Status: full Family Communication:  Discussed directly with patient Disposition Plan: Pending further evaluation recommendations from cardiologist   Consultants:  Cardiology  Procedures:  None  Antibiotics:  Aztreonam and vancomycin  HPI/Subjective: Patient has no new complaints. Still complaining of nausea. States that marinol helps and does not confuse her  Objective: Filed Vitals:   02/13/15 1008  BP: 113/83  Pulse: 128  Temp: 97.8 F (36.6 C)  Resp: 16    Intake/Output Summary (Last 24 hours) at 02/13/15 1225 Last data filed at 02/13/15 0900  Gross per 24 hour   Intake    580 ml  Output      1 ml  Net    579 ml   Filed Weights   02/11/15 0518 02/12/15 0608 02/13/15 0559  Weight: 96.4 kg (212 lb 8.4 oz) 97.251 kg (214 lb 6.4 oz) 96.888 kg (213 lb 9.6 oz)    Exam:   General:  Patient in no acute distress, alert and awake  Cardiovascular: S1 and S2 present, no rubs  Respiratory: No increased work of breathing, clear to auscultation bilaterally, equal chest rise  Abdomen: Soft, nondistended, nontender  Musculoskeletal: No cyanosis or clubbing on limited exam   Data Reviewed: Basic Metabolic Panel:  Recent Labs Lab 02/10/15 1106 02/10/15 1614 02/12/15 0421 02/13/15 0509  NA 134*  --  140 138  K 4.5  --  4.3 4.3  CL 97  --  106 104  CO2 24  --  25 25  GLUCOSE 151*  --  122* 128*  BUN 23  --  15 19  CREATININE 1.05  --  0.94 0.94  CALCIUM 10.1  --  8.6 8.5  MG  --  1.7  --   --   PHOS  --  3.4  --   --    Liver Function Tests:  Recent Labs Lab 02/10/15 1106  AST 37  ALT 19  ALKPHOS 66  BILITOT 1.1  PROT 7.1  ALBUMIN 3.2*    Recent Labs Lab 02/10/15 1106  LIPASE 23   No results for input(s): AMMONIA in the last 168 hours. CBC:  Recent Labs Lab 02/10/15 1106 02/11/15 1213 02/12/15 0421  WBC 10.4 7.9 8.2  NEUTROABS 7.4  --   --   HGB 14.2 13.4 12.3  HCT 44.5 42.7 39.7  MCV 92.1  92.6 93.0  PLT 192 167 181   Cardiac Enzymes:  Recent Labs Lab 02/10/15 1614 02/10/15 2205 02/11/15 0352  TROPONINI 0.48* 0.35* 0.32*   BNP (last 3 results) No results for input(s): BNP in the last 8760 hours.  ProBNP (last 3 results) No results for input(s): PROBNP in the last 8760 hours.  CBG: No results for input(s): GLUCAP in the last 168 hours.  Recent Results (from the past 240 hour(s))  Urine culture     Status: None   Collection Time: 02/10/15  1:03 PM  Result Value Ref Range Status   Specimen Description URINE, CATHETERIZED  Final   Special Requests NONE  Final   Colony Count NO GROWTH Performed at  [facility]   Final   Culture NO GROWTH Performed at [facility]   Final   Report Status 02/12/2015 FINAL  Final  Blood Culture (routine x 2)     Status: None (Preliminary result)   Collection Time: 02/10/15  2:40 PM  Result Value Ref Range Status   Specimen Description BLOOD RIGHT ANTECUBITAL  Final   Special Requests BOTTLES DRAWN AEROBIC AND ANAEROBIC 10MLS  Final   Culture   Final           BLOOD CULTURE RECEIVED NO GROWTH TO DATE CULTURE WILL BE HELD FOR 5 DAYS BEFORE ISSUING A FINAL NEGATIVE REPORT Performed at [facility]    Report Status PENDING  Incomplete  Blood Culture (routine x 2)     Status: None (Preliminary result)   Collection Time: 02/10/15  2:45 PM  Result Value Ref Range Status   Specimen Description BLOOD LEFT ANTECUBITAL  Final   Special Requests BOTTLES DRAWN AEROBIC AND ANAEROBIC 5MLS  Final   Culture   Final           BLOOD CULTURE RECEIVED NO GROWTH TO DATE CULTURE WILL BE HELD FOR 5 DAYS BEFORE ISSUING A FINAL NEGATIVE REPORT Performed at [facility]    Report Status PENDING  Incomplete  Culture, blood (routine x 2) Call MD if unable to obtain prior to antibiotics being given     Status: None (Preliminary result)   Collection Time: 02/10/15  6:05 PM  Result Value Ref Range Status   Specimen Description BLOOD RIGHT ARM  Final   Special Requests BOTTLES DRAWN AEROBIC AND ANAEROBIC 5CC EA  Final   Culture   Final           BLOOD CULTURE RECEIVED NO GROWTH TO DATE CULTURE WILL BE HELD FOR 5 DAYS BEFORE ISSUING A FINAL NEGATIVE REPORT Performed at [facility]    Report Status PENDING  Incomplete  Culture, blood (routine x 2) Call MD if unable to obtain prior to antibiotics being given     Status: None (Preliminary result)   Collection Time: 02/10/15  6:15 PM  Result Value Ref Range Status   Specimen Description BLOOD RIGHT ARM  Final   Special Requests BOTTLES DRAWN AEROBIC AND ANAEROBIC B 4CC R 3CC  Final    Culture   Final           BLOOD CULTURE RECEIVED NO GROWTH TO DATE CULTURE WILL BE HELD FOR 5 DAYS BEFORE ISSUING A FINAL NEGATIVE REPORT Performed at [facility]    Report Status PENDING  Incomplete     Studies: Nm Myocar Multi W/spect W/wall Motion / Ef  02/12/2015   CLINICAL DATA:  Ms. Eliberto Ivory is a 81 year old female who is admitted to  the hospital with episodes of chest pain. She was scheduled for a [place] study for further evaluation.  EXAM: MYOCARDIAL IMAGING WITH SPECT (REST AND PHARMACOLOGIC-STRESS)  GATED LEFT VENTRICULAR WALL MOTION STUDY  LEFT VENTRICULAR EJECTION FRACTION  TECHNIQUE: Standard myocardial SPECT imaging was performed after resting intravenous injection of 10 mCi Tc-98m sestamibi. Subsequently, intravenous infusion of Lexiscan was performed under the supervision of the Cardiology staff. At peak effect of the drug, 30 mCi Tc-65m sestamibi was injected intravenously and standard myocardial SPECT imaging was performed. Quantitative gated imaging was also performed to evaluate left ventricular wall motion, and estimate left ventricular ejection fraction.  COMPARISON:  None.  FINDINGS: Review of the EKG tracings reveals no significant ST or T wave changes.  Raw data images:  There is no significant motion artifact.  Stress images: There is smooth and homogeneous uptake in all areas of the myocardium.  Resting images: Smooth and homogeneous uptake in all areas of the myocardium. There is no evidence of ischemia or infarction.  Wall Motion:  The left ventricle systolic function is normal.  Left Ventricular Ejection Fraction: 57 %  End diastolic volume 38 ml  End systolic volume 16 ml  IMPRESSION: 1.  Normal Lexiscan Myoview study.  2.  The left ventricle systolic function is normal  3. Left ventricular ejection fraction 57%  *2012 Appropriate Use Criteria for Coronary Revascularization Focused Update: J Am Coll Cardiol. 5366;44(0):347-425.  http://content.airportbarriers.com.aspx?articleid=[email or web address]  Mertie Moores, MD, [facility]   Electronically Signed   By: Mertie Moores   On: 02/12/2015 16:45    Scheduled Meds: . apixaban  5 mg Oral BID  . cefUROXime  500 mg Oral BID WC  . metoprolol tartrate  37.5 mg Oral BID  . zolpidem  5 mg Oral Once   Continuous Infusions:    Time spent: > 35 minutes    Velvet Bathe  [facility] Pager [phone] If 7PM-7AM, please contact night-coverage at [email or web address], password [facility] 02/13/2015, 12:25 PM  LOS: 3 days

## 2015-02-13 NOTE — Progress Notes (Signed)
Patient voiced that she has been asking to speak with MD since last night regarding her nausea. Night RN had addressed all her requests. She denied any nausea this AM and able to tolerate her oral fluid (tea at bedside). She still requesting to see MD and wanting to know why no one is here yet to see her. RN paged MD. Will continue to monitor.  Ave Filter, RN

## 2015-02-13 NOTE — Progress Notes (Signed)
Subjective:  No complaints, wants to know when she will be going home  Objective:  Vital Signs in the last 24 hours: Temp:  [97.7 F (36.5 C)-98.3 F (36.8 C)] 97.8 F (36.6 C) (04/19 0559) Pulse Rate:  [81-133] 126 (04/19 0559) Resp:  [17-30] 17 (04/19 0559) BP: (113-143)/(78-95) 113/91 mmHg (04/19 0559) SpO2:  [94 %-98 %] 98 % (04/19 0559) Weight:  [213 lb 9.6 oz (96.888 kg)] 213 lb 9.6 oz (96.888 kg) (04/19 0559)  Intake/Output from previous day:  Intake/Output Summary (Last 24 hours) at 02/13/15 0957 Last data filed at 02/13/15 0900  Gross per 24 hour  Intake    580 ml  Output      1 ml  Net    579 ml   . cefUROXime  500 mg Oral BID WC  . heparin  5,000 Units Subcutaneous 3 times per day  . metoprolol tartrate  25 mg Oral BID  . zolpidem  5 mg Oral Once    Physical Exam: General appearance: alert, cooperative and no distress Lungs: basilar crackles Heart: RRR with extra sytole Extremities: 1+ LEE Pulses: 2+ and symmetric Skin: Warm and dry Neurologic: Grossly normal   Rate: 87  Rhythm: SR currently  Lab Results:  Recent Labs  02/11/15 1213 02/12/15 0421  WBC 7.9 8.2  HGB 13.4 12.3  PLT 167 181    Recent Labs  02/12/15 0421 02/13/15 0509  NA 140 138  K 4.3 4.3  CL 106 104  CO2 25 25  GLUCOSE 122* 128*  BUN 15 19  CREATININE 0.94 0.94    Recent Labs  02/10/15 2205 02/11/15 0352  TROPONINI 0.35* 0.32*   No results for input(s): INR in the last 72 hours.      Assessment/Plan:  81 y.o. female with a PMH of PAT, OSA versus obesity hypoventilation syndrome as well as pulmonary hypertension at least in part secondary to previous venous thromboembolic disease (bilateral pulmonary embolism following bilateral knee replacement), with subsequent right heart failure. Echocardiography has estimated PA pressures as high as 73 mmHg but invasive evaluation right heart catheterization December 2015 showed a PA pressure 43/12 (mean 25) mm Hg.  however right ventricular strain has been noted both on echocardiography and CT angiography of the chest. She no longer takes anticoagulation since December 2015.   Pt admitted 02/10/15 with nasuea and weakness after a course of Azithromycin as an OP for URI. On admission she had am abnormal EKG with prolonged QTc, TWI, and new RBBB. Her Troponin was also slightly elevated. Since admission she has had transient junctional rhythm as well as PAT. Her verapamil was stopped, lopressor decreased. Myoview 02/12/15 was low risk.   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 She is on Lopressor 25 mg BID. Ambulate today.  Her CHADSVASC is 3 for age and sex, ? NOAC. Check 12 lead EKG this am.  Ilean China Beeper [phone]  02/13/2015, 9:57 AM   Patient seen and examined. Agree with assessment and plan. Myoview is normal with EF  57%. Personal review of rhythm strips and telemetry reveals episodes os sinus rhythm with PAC's and recurrent episodes where HR 120 - 140 without discernible P waves. Will initiate NOAC with eliquis 5 mg bid. Will titrate lopressor to 37.5 mg bid. Suspect SSS; will need to watch for bradycardia.  Will obtain 12 lead ECG today. Discussed at length with patient and daughter.   Troy Sine, MD, [facility] 02/13/2015 11:07 AM

## 2015-02-13 NOTE — Progress Notes (Addendum)
Patient complaining of nausea. Placed cold washcloth on head/neck and paged Baltazar Najjar, NP.  Patient received marinol yesterday one time PRN.  Gave patient ice chips to help with nausea. Awaiting orders

## 2015-02-13 NOTE — Progress Notes (Signed)
Patient HR maintaining 110-120s even with new dosage of metoprolol 25mg  given last night.

## 2015-02-14 MED ORDER — METOPROLOL TARTRATE 50 MG PO TABS
50.0000 mg | ORAL_TABLET | Freq: Two times a day (BID) | ORAL | Status: DC
  Administered 2015-02-14 – 2015-02-15 (×3): 50 mg via ORAL
  Filled 2015-02-14 (×4): qty 1

## 2015-02-14 NOTE — Progress Notes (Addendum)
Subjective:  No complaints, HR up when she is up  Objective:  Vital Signs in the last 24 hours: Temp:  [97.8 F (36.6 C)-98.5 F (36.9 C)] 98 F (36.7 C) (04/20 0501) Pulse Rate:  [59-130] 61 (04/20 0501) Resp:  [16-18] 18 (04/19 2136) BP: (96-121)/(64-83) 121/83 mmHg (04/20 0501) SpO2:  [92 %-94 %] 93 % (04/20 0501) Weight:  [213 lb 9.6 oz (96.888 kg)] 213 lb 9.6 oz (96.888 kg) (04/20 0501)  Intake/Output from previous day:  Intake/Output Summary (Last 24 hours) at 02/14/15 0953 Last data filed at 02/14/15 0851  Gross per 24 hour  Intake    780 ml  Output    201 ml  Net    579 ml   . apixaban  5 mg Oral BID  . cefUROXime  500 mg Oral BID WC  . metoprolol tartrate  50 mg Oral BID  . zolpidem  5 mg Oral Once    Physical Exam: General appearance: alert, moderately obese, cooperative and no distress Lungs: Clear Heart: RRR with extra sytole Extremities: 1+ LEE Pulses: 2+ and symmetric Skin: Warm and dry Neurologic: Grossly normal   Rate: 75-120  Rhythm: SR currently  Lab Results:  Recent Labs  02/11/15 1213 02/12/15 0421  WBC 7.9 8.2  HGB 13.4 12.3  PLT 167 181    Recent Labs  02/12/15 0421 02/13/15 0509  NA 140 138  K 4.3 4.3  CL 106 104  CO2 25 25  GLUCOSE 122* 128*  BUN 15 19  CREATININE 0.94 0.94   No results for input(s): TROPONINI in the last 72 hours.  Invalid input(s): CK, MB No results for input(s): INR in the last 72 hours.  Meds . apixaban  5 mg Oral BID  . cefUROXime  500 mg Oral BID WC  . metoprolol tartrate  50 mg Oral BID  . zolpidem  5 mg Oral Once      Assessment/Plan:  81 y.o. female with a PMH of PAT, OSA versus obesity hypoventilation syndrome as well as pulmonary hypertension at least in part secondary to previous venous thromboembolic disease (bilateral pulmonary embolism following bilateral knee replacement), with subsequent right heart failure. Echocardiography has estimated PA pressures as high as 73 mmHg but  invasive evaluation right heart catheterization December 2015 showed a PA pressure 43/12 (mean 25) mm Hg. however right ventricular strain has been noted both on echocardiography and CT angiography of the chest. She no longer takes anticoagulation since December 2015.   Pt admitted 02/10/15 with nasuea and weakness after a course of Azithromycin as an OP for URI. On admission she had am abnormal EKG with prolonged QTc, TWI, and new RBBB. Her Troponin was also slightly elevated. Since admission she has had transient junctional rhythm as well as PAT. Her verapamil was stopped, lopressor decreased. Myoview 02/12/15 was low risk.   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 Will increase Metoprolol to 50 mg BID. ? EP consult.  Erlene Quan   PA-C Beeper [phone]  02/14/2015, 9:53 AM   Patient seen and examined. Agree with  assessment and plan. Feels better. HR now 113. Will further increase metoprolol to 50 mg bid. Now on eliquis. No bleeding. ECG from yesterday read by me probable accelerated junctional tachycardia with retrograde P .?? Long RP tachycardia. QTc 489 msec.   Troy Sine, MD, [facility] 02/14/2015 11:59 AM

## 2015-02-14 NOTE — Progress Notes (Signed)
[facility] PROGRESS NOTE  EVOLETTE PENDELL SWN:[id number] DOB: [birth year] DOA: 02/10/2015 PCP: Loura Pardon, MD  Brief narrative 81 year old female with paroxysmal SVT, hypertension, history of DVT and PE, moderate pulmonary hypertension who presented with 3-4 days of shortness of breath associated with nonproductive cough. Patient was prescribed azithromycin as outpatient without much improvement. In the ED patient was found to have a junctional rhythm with prolonged QTC and mildly elevated troponin. Also had elevated lactic acid. Patient admitted for ACS rule out and possible pneumonia.   Assessment/Plan: Community acquired pneumonia Patient empirically treated with vancomycin and aztreonam. cx negative. Switched to oral Ceftin. Will complete 5 day course of antibiotics after today. supportive care with Tylenol and antitussives.  PSVT with ?[facility] Cardiology following and added Eliquis. Metoprolol dose further increased to 50 mg twice a day as patient tachycardic up to 130s on the monitor. Discontinued verapamil this admission.  Abnormal EKG with T-wave inversion and RBBB, prolonged QTC and elevated troponin on admission. Normal Lexiscan Myoview. EF of 57% repeat EKG shows junctional rhythm with normal QTC.  Elevated blood glucose  check A1C    DVT prophylaxis : Eliquis   DIET: Heart healthy  Code Status: full code Family Communication: none at bedside Disposition Plan: home in 1-2 days once HR stable   Consultants:  cardiology  Procedures:  lexiscan myoview  Antibiotics: vanco and aztreonam 4/16-4/18  ceftin 4/19--  HPI/Subjective: Patient seen and examined. Still reports some nonproductive cough but denies chest pain or SOB  Objective: Filed Vitals:   02/14/15 0955  BP: 113/72  Pulse: 62  Temp:   Resp: 18    Intake/Output Summary (Last 24 hours) at 02/14/15 1342 Last data filed at 02/14/15 0956  Gross per 24 hour  Intake   1020 ml  Output    201 ml   Net    819 ml   Filed Weights   02/12/15 0608 02/13/15 0559 02/14/15 0501  Weight: 97.251 kg (214 lb 6.4 oz) 96.888 kg (213 lb 9.6 oz) 96.888 kg (213 lb 9.6 oz)    Exam:   General:  Elderly female in no acute distress  HEENT: No pallor, moist oral mucosa,   Chest: Fine right basilar crackles, no rhonchi or wheeze  CVS: S1 and S2 irregular, no murmurs  GI: Soft, nondistended, nontender, bowel sounds present   Musculoskeletal: Warm, no edema  CNS: Alert and oriented  Data Reviewed: Basic Metabolic Panel:  Recent Labs Lab 02/10/15 1106 02/10/15 1614 02/12/15 0421 02/13/15 0509  NA 134*  --  140 138  K 4.5  --  4.3 4.3  CL 97  --  106 104  CO2 24  --  25 25  GLUCOSE 151*  --  122* 128*  BUN 23  --  15 19  CREATININE 1.05  --  0.94 0.94  CALCIUM 10.1  --  8.6 8.5  MG  --  1.7  --   --   PHOS  --  3.4  --   --    Liver Function Tests:  Recent Labs Lab 02/10/15 1106  AST 37  ALT 19  ALKPHOS 66  BILITOT 1.1  PROT 7.1  ALBUMIN 3.2*    Recent Labs Lab 02/10/15 1106  LIPASE 23   No results for input(s): AMMONIA in the last 168 hours. CBC:  Recent Labs Lab 02/10/15 1106 02/11/15 1213 02/12/15 0421  WBC 10.4 7.9 8.2  NEUTROABS 7.4  --   --   HGB 14.2 13.4 12.3  HCT 44.5 42.7 39.7  MCV 92.1 92.6 93.0  PLT 192 167 181   Cardiac Enzymes:  Recent Labs Lab 02/10/15 1614 02/10/15 2205 02/11/15 0352  TROPONINI 0.48* 0.35* 0.32*   BNP (last 3 results) No results for input(s): BNP in the last 8760 hours.  ProBNP (last 3 results) No results for input(s): PROBNP in the last 8760 hours.  CBG: No results for input(s): GLUCAP in the last 168 hours.  Recent Results (from the past 240 hour(s))  Urine culture     Status: None   Collection Time: 02/10/15  1:03 PM  Result Value Ref Range Status   Specimen Description URINE, CATHETERIZED  Final   Special Requests NONE  Final   Colony Count NO GROWTH Performed at [facility]   Final    Culture NO GROWTH Performed at [facility]   Final   Report Status 02/12/2015 FINAL  Final  Blood Culture (routine x 2)     Status: None (Preliminary result)   Collection Time: 02/10/15  2:40 PM  Result Value Ref Range Status   Specimen Description BLOOD RIGHT ANTECUBITAL  Final   Special Requests BOTTLES DRAWN AEROBIC AND ANAEROBIC 10MLS  Final   Culture   Final           BLOOD CULTURE RECEIVED NO GROWTH TO DATE CULTURE WILL BE HELD FOR 5 DAYS BEFORE ISSUING A FINAL NEGATIVE REPORT Performed at [facility]    Report Status PENDING  Incomplete  Blood Culture (routine x 2)     Status: None (Preliminary result)   Collection Time: 02/10/15  2:45 PM  Result Value Ref Range Status   Specimen Description BLOOD LEFT ANTECUBITAL  Final   Special Requests BOTTLES DRAWN AEROBIC AND ANAEROBIC 5MLS  Final   Culture   Final           BLOOD CULTURE RECEIVED NO GROWTH TO DATE CULTURE WILL BE HELD FOR 5 DAYS BEFORE ISSUING A FINAL NEGATIVE REPORT Performed at [facility]    Report Status PENDING  Incomplete  Culture, blood (routine x 2) Call MD if unable to obtain prior to antibiotics being given     Status: None (Preliminary result)   Collection Time: 02/10/15  6:05 PM  Result Value Ref Range Status   Specimen Description BLOOD RIGHT ARM  Final   Special Requests BOTTLES DRAWN AEROBIC AND ANAEROBIC 5CC EA  Final   Culture   Final           BLOOD CULTURE RECEIVED NO GROWTH TO DATE CULTURE WILL BE HELD FOR 5 DAYS BEFORE ISSUING A FINAL NEGATIVE REPORT Performed at [facility]    Report Status PENDING  Incomplete  Culture, blood (routine x 2) Call MD if unable to obtain prior to antibiotics being given     Status: None (Preliminary result)   Collection Time: 02/10/15  6:15 PM  Result Value Ref Range Status   Specimen Description BLOOD RIGHT ARM  Final   Special Requests BOTTLES DRAWN AEROBIC AND ANAEROBIC B 4CC R 3CC  Final   Culture   Final           BLOOD  CULTURE RECEIVED NO GROWTH TO DATE CULTURE WILL BE HELD FOR 5 DAYS BEFORE ISSUING A FINAL NEGATIVE REPORT Performed at [facility]    Report Status PENDING  Incomplete     Studies: Nm Myocar Multi W/spect W/wall Motion / Ef  02/12/2015   CLINICAL DATA:  Ms. Eliberto Ivory is  a 81 year old female who is admitted to the hospital with episodes of chest pain. She was scheduled for a [place] study for further evaluation.  EXAM: MYOCARDIAL IMAGING WITH SPECT (REST AND PHARMACOLOGIC-STRESS)  GATED LEFT VENTRICULAR WALL MOTION STUDY  LEFT VENTRICULAR EJECTION FRACTION  TECHNIQUE: Standard myocardial SPECT imaging was performed after resting intravenous injection of 10 mCi Tc-25m sestamibi. Subsequently, intravenous infusion of Lexiscan was performed under the supervision of the Cardiology staff. At peak effect of the drug, 30 mCi Tc-69m sestamibi was injected intravenously and standard myocardial SPECT imaging was performed. Quantitative gated imaging was also performed to evaluate left ventricular wall motion, and estimate left ventricular ejection fraction.  COMPARISON:  None.  FINDINGS: Review of the EKG tracings reveals no significant ST or T wave changes.  Raw data images:  There is no significant motion artifact.  Stress images: There is smooth and homogeneous uptake in all areas of the myocardium.  Resting images: Smooth and homogeneous uptake in all areas of the myocardium. There is no evidence of ischemia or infarction.  Wall Motion:  The left ventricle systolic function is normal.  Left Ventricular Ejection Fraction: 57 %  End diastolic volume 38 ml  End systolic volume 16 ml  IMPRESSION: 1.  Normal Lexiscan Myoview study.  2.  The left ventricle systolic function is normal  3. Left ventricular ejection fraction 57%  *2012 Appropriate Use Criteria for Coronary Revascularization Focused Update: J Am Coll Cardiol. 2707;86(7):544-920. http://content.[email or web address].aspx?articleid=1201161   Mertie Moores, MD, [facility]   Electronically Signed   By: Mertie Moores   On: 02/12/2015 16:45    Scheduled Meds: . apixaban  5 mg Oral BID  . cefUROXime  500 mg Oral BID WC  . metoprolol tartrate  50 mg Oral BID  . zolpidem  5 mg Oral Once   Continuous Infusions:     Time spent:25 minutes    Zury Fazzino, [place]  [facility] Pager [phone]. If 7PM-7AM, please contact night-coverage at [email or web address], password [facility] 02/14/2015, 1:42 PM  LOS: 4 days

## 2015-02-14 NOTE — Discharge Instructions (Signed)
Apixaban oral tablets What is this medicine? APIXABAN (a PIX a ban) is an anticoagulant (blood thinner). It is used to lower the chance of stroke in people with a medical condition called atrial fibrillation. It is also used to treat or prevent blood clots in the lungs or in the veins. This medicine may be used for other purposes; ask your health care provider or pharmacist if you have questions. COMMON BRAND NAME(S): Eliquis What should I tell my health care provider before I take this medicine? They need to know if you have any of these conditions: -bleeding disorders -bleeding in the brain -blood in your stools (black or tarry stools) or if you have blood in your vomit -history of stomach bleeding -kidney disease -liver disease -mechanical heart valve -an unusual or allergic reaction to apixaban, other medicines, foods, dyes, or preservatives -pregnant or trying to get pregnant -breast-feeding How should I use this medicine? Take this medicine by mouth with a glass of water. Follow the directions on the prescription label. You can take it with or without food. If it upsets your stomach, take it with food. Take your medicine at regular intervals. Do not take it more often than directed. Do not stop taking except on your doctor's advice. Stopping this medicine may increase your risk of a blot clot. Be sure to refill your prescription before you run out of medicine. Talk to your pediatrician regarding the use of this medicine in children. Special care may be needed. Overdosage: If you think you have taken too much of this medicine contact a poison control center or emergency room at once. NOTE: This medicine is only for you. Do not share this medicine with others. What if I miss a dose? If you miss a dose, take it as soon as you can. If it is almost time for your next dose, take only that dose. Do not take double or extra doses. What may interact with this medicine? This medicine may  interact with the following: -aspirin and aspirin-like medicines -certain medicines for fungal infections like ketoconazole and itraconazole -certain medicines for seizures like carbamazepine and phenytoin -certain medicines that treat or prevent blood clots like warfarin, enoxaparin, and dalteparin -clarithromycin -NSAIDs, medicines for pain and inflammation, like ibuprofen or naproxen -rifampin -ritonavir -St. John's wort This list may not describe all possible interactions. Give your health care provider a list of all the medicines, herbs, non-prescription drugs, or dietary supplements you use. Also tell them if you smoke, drink alcohol, or use illegal drugs. Some items may interact with your medicine. What should I watch for while using this medicine? Notify your doctor or health care professional and seek emergency treatment if you develop breathing problems; changes in vision; chest pain; severe, sudden headache; pain, swelling, warmth in the leg; trouble speaking; sudden numbness or weakness of the face, arm, or leg. These can be signs that your condition has gotten worse. If you are going to have surgery, tell your doctor or health care professional that you are taking this medicine. Tell your health care professional that you use this medicine before you have a spinal or epidural procedure. Sometimes people who take this medicine have bleeding problems around the spine when they have a spinal or epidural procedure. This bleeding is very rare. If you have a spinal or epidural procedure while on this medicine, call your health care professional immediately if you have back pain, numbness or tingling (especially in your legs and feet), muscle weakness, paralysis, or loss   of bladder or bowel control. Avoid sports and activities that might cause injury while you are using this medicine. Severe falls or injuries can cause unseen bleeding. Be careful when using sharp tools or knives. Consider using  an electric razor. Take special care brushing or flossing your teeth. Report any injuries, bruising, or red spots on the skin to your doctor or health care professional. What side effects may I notice from receiving this medicine? Side effects that you should report to your doctor or health care professional as soon as possible: -allergic reactions like skin rash, itching or hives, swelling of the face, lips, or tongue -signs and symptoms of bleeding such as bloody or black, tarry stools; red or dark-brown urine; spitting up blood or brown material that looks like coffee grounds; red spots on the skin; unusual bruising or bleeding from the eye, gums, or nose This list may not describe all possible side effects. Call your doctor for medical advice about side effects. You may report side effects to FDA at 1-800-FDA-1088. Where should I keep my medicine? Keep out of the reach of children. Store at room temperature between 20 and 25 degrees C (68 and 77 degrees F). Throw away any unused medicine after the expiration date. NOTE: This sheet is a summary. It may not cover all possible information. If you have questions about this medicine, talk to your doctor, pharmacist, or health care provider.  2015, Elsevier/Gold Standard. (2013-06-17 11:59:24)  

## 2015-02-15 DIAGNOSIS — Z86711 Personal history of pulmonary embolism: Secondary | ICD-10-CM

## 2015-02-15 DIAGNOSIS — J181 Lobar pneumonia, unspecified organism: Secondary | ICD-10-CM

## 2015-02-15 LAB — LEGIONELLA ANTIGEN, URINE

## 2015-02-15 LAB — CLOSTRIDIUM DIFFICILE BY PCR: Toxigenic C. Difficile by PCR: NEGATIVE

## 2015-02-15 LAB — HEMOGLOBIN A1C
HEMOGLOBIN A1C: 5.8 % — AB (ref 4.8–5.6)
Mean Plasma Glucose: 120 mg/dL

## 2015-02-15 MED ORDER — METOPROLOL TARTRATE 50 MG PO TABS: 50.0000 mg | ORAL_TABLET | Freq: Two times a day (BID) | ORAL | Status: DC

## 2015-02-15 MED ORDER — ONDANSETRON HCL 4 MG PO TABS
4.0000 mg | ORAL_TABLET | Freq: Three times a day (TID) | ORAL | Status: DC
  Administered 2015-02-15 (×2): 4 mg via ORAL
  Filled 2015-02-15 (×2): qty 1

## 2015-02-15 MED ORDER — ONDANSETRON HCL 4 MG PO TABS: 4.0000 mg | ORAL_TABLET | Freq: Three times a day (TID) | ORAL | Status: DC | PRN

## 2015-02-15 MED ORDER — APIXABAN 5 MG PO TABS: 5.0000 mg | ORAL_TABLET | Freq: Two times a day (BID) | ORAL | Status: DC

## 2015-02-15 NOTE — Consult Note (Signed)
ELECTROPHYSIOLOGY CONSULT NOTE    Patient ID: [name] MRN: [id number], DOB/AGE: 1934/04/12 81 y.o.  Admit date: 02/10/2015 Date of Consult: 02/15/2015  Primary Physician: Loura Pardon, MD Electrophysiologist: All[place]  Reason for Consultation: atrial arrhythmias, h/o long RP tachycardia  HPI:  [name] is a 81 y.o. female with a past medical history significant for hypertension, prior PE's (off anticoagulation since 09/2014), pulmonary hypertension, right heart failure, and SVT.  She has a long standing history of SVT that has been medically managed.  She has previously declined ablation.  She was admitted 4/16 with anorexia, nausea, and occasional vomiting in the setting of URI.  She was hypotensive but this recovered.  QT interval was initially prolonged but has normalized.  She has had intermittent SVT and her metoprolol has been increased.  EP has been asked to evaluate prior to discharge with intermittent SVT and junctional rhythm.   She has not had dizziness, syncope, or pre-syncope. She denies chest pain.  Her SVT is relatively asymptomatic.  Her nausea and vomiting have resolved.  She would like to go home.   Echo 02/12/15 demonstrated EF 55%, mild MR, moderate TR, PA pressure 57, RV severely dilated, small pericardial effusion, LA 37.   She reports compliance with CPAP at home, but has not been wearing this admission.   Past Medical History  Diagnosis Date  . Osteopenia   . Hyperlipidemia   . Hypertension   . PSVT (paroxysmal supraventricular tachycardia)   . Spinal stenosis   . Overactive bladder     urge incontinence  . Pulmonary emboli   . Pulmonary hypertension   . Right ventricular failure   . Arthritis     knee  . Anemia   . Diverticulosis   . Sleep apnea     CPAP  . Hx of colonic polyps 10/24/2014     Surgical History:  Past Surgical History  Procedure Laterality Date  . Cataract extraction Bilateral 2008  . Bladder suspension  1993  . Total  knee arthroplasty Left 2000  . Total knee revision  01/12/2012    Procedure: TOTAL KNEE REVISION;  Surgeon: Kerin Salen, MD;  Location: [place];  Service: Orthopedics;  Laterality: Left;  DEPUY/ LCS , HAND SET  . Trigger finger release  01/12/2012    Procedure: RELEASE TRIGGER FINGER/A-1 PULLEY;  Surgeon: Kerin Salen, MD;  Location: [place];  Service: Orthopedics;  Laterality: Right;  . Cholecystectomy    . Incisional hernia repair    . Total knee arthroplasty Left 10/29/2012    at [facility]  . Total knee arthroplasty Right 03/09/2014  . Colonoscopy    . Right heart catheterization N/A 07/04/2014    Procedure: RIGHT HEART CATH;  Surgeon: Jolaine Artist, MD;  Location: [facility] CATH LAB;  Service: Cardiovascular;  Laterality: N/A;  . Tonsillectomy and adenoidectomy    . Colonoscopy N/A 10/24/2014    Procedure: COLONOSCOPY;  Surgeon: Gatha Mayer, MD;  Location: [facility] ENDOSCOPY;  Service: Endoscopy;  Laterality: N/A;     Prescriptions prior to admission  Medication Sig Dispense Refill Last Dose  . benzonatate (TESSALON) 200 MG capsule Take 1 capsule (200 mg total) by mouth 2 (two) times daily as needed for cough. 20 capsule 0 02/09/2015 at Unknown time  . Calcium Carb-Cholecalciferol (CALCIUM 600/VITAMIN D3) 600-800 MG-UNIT TABS Take 1 tablet by mouth 2 (two) times daily.   02/09/2015 at Unknown time  . diphenhydrAMINE (BENADRYL) 25 mg capsule Take 25 mg by  mouth every morning.    02/10/2015 at Unknown time  . metoprolol (LOPRESSOR) 50 MG tablet Take 50 mg by mouth daily.   02/10/2015 at [place]  . Multiple Vitamin (MULTIVITAMIN WITH MINERALS) TABS tablet Take 1 tablet by mouth daily.   02/09/2015 at Unknown time  . OXYGEN Inhale 2 L/min into the lungs at bedtime.   02/09/2015 at Unknown time  . polyethylene glycol (MIRALAX / GLYCOLAX) packet Take 8.5 g by mouth daily.    02/09/2015 at Unknown time  . verapamil (CALAN-SR) 120 MG CR tablet TAKE 1 TABLET AT BEDTIME 90 tablet 0 02/09/2015 at Unknown time  . vitamin C  (ASCORBIC ACID) 500 MG tablet Take 500 mg by mouth daily.   02/09/2015 at Unknown time  . azithromycin (ZITHROMAX) 250 MG tablet 2 tab po x 1 day then 1 tab po daily (Patient not taking: Reported on 02/10/2015) 6 tablet 0 Completed Course at 02/06/15    Inpatient Medications:  . apixaban  5 mg Oral BID  . cefUROXime  500 mg Oral BID WC  . metoprolol tartrate  50 mg Oral BID  . ondansetron  4 mg Oral 3 times per day  . zolpidem  5 mg Oral Once    Allergies:  Allergies  Allergen Reactions  . Penicillins Hives  . Sulfonamide Derivatives Hives  . Adhesive [Tape] Other (See Comments)    redness  . Prednisone Other (See Comments)    Causes insomnia    History   Social History  . Marital Status: Widowed    Spouse Name: N/A  . Number of Children: 3  . Years of Education: N/A   Occupational History  . Retired   .     Social History Main Topics  . Smoking status: Never Smoker   . Smokeless tobacco: Never Used  . Alcohol Use: No  . Drug Use: No  . Sexual Activity: No   Other Topics Concern  . Not on file   Social History Narrative   Pt lives alone but dtr lives on property and has been staying with her since surgery.     Family History  Problem Relation Age of Onset  . Breast cancer Mother   . Heart disease Father   . Diabetes Father   . Hypertension Father   . Uterine cancer Sister      Review of Systems: All other systems reviewed and are otherwise negative except as noted above.  Physical Exam: Filed Vitals:   02/14/15 2124 02/15/15 0210 02/15/15 0612 02/15/15 1009  BP:   116/73 107/65  Pulse: 121 55 51 61  Temp:   97.7 F (36.5 C)   TempSrc:   Oral Oral  Resp:   18 18  Height:      Weight:   213 lb 11.2 oz (96.934 kg)   SpO2:   94% 95%    GEN- The patient is well appearing, alert and oriented x 3 today.   HEENT: normocephalic, atraumatic; sclera clear, conjunctiva pink; hearing intact; oropharynx clear; neck supple, no JVP Lymph- no cervical  lymphadenopathy Lungs- Clear to ausculation bilaterally, normal work of breathing.  No wheezes, rales, rhonchi Heart- Regular rate and rhythm, no murmurs, rubs or gallops  GI- soft, obese, non-tender, non-distended, bowel sounds present  Extremities- no clubbing, cyanosis, 1+ edema MS- no significant deformity or atrophy Skin- warm and dry, no rash or lesion Psych- euthymic mood, full affect Neuro- strength and sensation are intact  Labs:   Lab Results  Component  Value Date   WBC 8.2 02/12/2015   HGB 12.3 02/12/2015   HCT 39.7 02/12/2015   MCV 93.0 02/12/2015   PLT 181 02/12/2015    Recent Labs Lab 02/10/15 1106  02/13/15 0509  NA 134*  < > 138  K 4.5  < > 4.3  CL 97  < > 104  CO2 24  < > 25  BUN 23  < > 19  CREATININE 1.05  < > 0.94  CALCIUM 10.1  < > 8.5  PROT 7.1  --   --   BILITOT 1.1  --   --   ALKPHOS 66  --   --   ALT 19  --   --   AST 37  --   --   GLUCOSE 151*  < > 128*  < > = values in this interval not displayed.    Radiology/Studies: Nm Myocar Multi W/spect W/wall Motion / Ef 02/12/2015   CLINICAL DATA:  Ms. Eliberto Ivory is a 81 year old female who is admitted to the hospital with episodes of chest pain. She was scheduled for a [place] study for further evaluation.  EXAM: MYOCARDIAL IMAGING WITH SPECT (REST AND PHARMACOLOGIC-STRESS)  GATED LEFT VENTRICULAR WALL MOTION STUDY  LEFT VENTRICULAR EJECTION FRACTION  TECHNIQUE: Standard myocardial SPECT imaging was performed after resting intravenous injection of 10 mCi Tc-30m sestamibi. Subsequently, intravenous infusion of Lexiscan was performed under the supervision of the Cardiology staff. At peak effect of the drug, 30 mCi Tc-40m sestamibi was injected intravenously and standard myocardial SPECT imaging was performed. Quantitative gated imaging was also performed to evaluate left ventricular wall motion, and estimate left ventricular ejection fraction.  COMPARISON:  None.  FINDINGS: Review of the EKG tracings reveals  no significant ST or T wave changes.  Raw data images:  There is no significant motion artifact.  Stress images: There is smooth and homogeneous uptake in all areas of the myocardium.  Resting images: Smooth and homogeneous uptake in all areas of the myocardium. There is no evidence of ischemia or infarction.  Wall Motion:  The left ventricle systolic function is normal.  Left Ventricular Ejection Fraction: 57 %  End diastolic volume 38 ml  End systolic volume 16 ml  IMPRESSION: 1.  Normal Lexiscan Myoview study.  2.  The left ventricle systolic function is normal  3. Left ventricular ejection fraction 57%  *2012 Appropriate Use Criteria for Coronary Revascularization Focused Update: J Am Coll Cardiol. 8416;60(6):301-601. http://content.[email or web address].aspx?articleid=1201161  Mertie Moores, MD, [facility]   Electronically Signed   By: Mertie Moores   On: 02/12/2015 16:45   Dg Chest Port 1 View 02/10/2015   CLINICAL DATA:  Acute onset of weakness. History of right ventricular failure and hypertension.  EXAM: PORTABLE CHEST - 1 VIEW  COMPARISON:  05/30/2014  FINDINGS: The heart is mildly enlarged. There is tortuosity and mild ectasia of the thoracic aorta. There are chronic emphysematous and bronchitic lung changes but no acute overlying pulmonary process. No pleural effusion. The bony thorax is intact.  IMPRESSION: Mild cardiac enlargement.  Chronic lung changes but no acute overlying pulmonary process.   Electronically Signed   By: Marijo Sanes M.D.   On: 02/10/2015 13:25   UXN:ATFT RP tachycardia, rate 116  TELEMETRY: sinus rhythm with intermittent SVT  Assessment/Plan:  1.  SVT She has a longstanding history of SVT and has been managed medically.  She does not wish to pursue ablation at this time.  Her SVT has likely been exacerbated by her  recent illness.  Would continue medical therapy for now.  Compliance with CPAP encouraged. Will arrange follow up with Dr Rayann Heman in 4-6 weeks (instructions entered  in AVS).   2. Prolonged QT Improved off of Azithromycin No ventricular arrhythmias noted on telemetry Avoid QT prolonging medications Keep K >3.9, Mg >1.9  3.  Elevated troponin Normal myoview this admission No further ischemic work up necessary  4.  HTN Stable No change required today  Electrophysiology team to see as needed while here. Please call with questions.   Signed, Chanetta Marshall, NP 02/15/2015 1:01 PM   EP Attending  Patient seen and examined. Agreee with the history, exam, assessment and plan with amendments as note by Chanetta Marshall, NP. She has a long h/o SVT with multiple medical problems. She has not changed her mind with regard to ablation though I have asked her to followup with Dr. Rayann Heman. She is maintaining NSR with uptitration of her medical therapy. Ok from my perspective to discharge home.  Mikle Bosworth.D.

## 2015-02-15 NOTE — Progress Notes (Signed)
Pt has orders to be discharged. Discharge instructions given and pt has no additional questions at this time. Medication regimen reviewed and pt educated. Pt verbalized understanding and has no additional questions. Telemetry box removed. IV removed and site in good condition. Pt stable and waiting for transportation.   Michalla Ringer RN 

## 2015-02-15 NOTE — Progress Notes (Addendum)
Subjective:  Her HR is stil > 100 when up but overall she feels better  Objective:  Vital Signs in the last 24 hours: Temp:  [97.5 F (36.4 C)-97.7 F (36.5 C)] 97.7 F (36.5 C) (04/21 0612) Pulse Rate:  [51-121] 51 (04/21 0612) Resp:  [16-18] 18 (04/21 0612) BP: (86-137)/(60-79) 116/73 mmHg (04/21 0612) SpO2:  [94 %-97 %] 94 % (04/21 0612) Weight:  [213 lb 11.2 oz (96.934 kg)] 213 lb 11.2 oz (96.934 kg) (04/21 0612)  Intake/Output from previous day:  Intake/Output Summary (Last 24 hours) at 02/15/15 0834 Last data filed at 02/15/15 0620  Gross per 24 hour  Intake   1070 ml  Output      1 ml  Net   1069 ml   . apixaban  5 mg Oral BID  . cefUROXime  500 mg Oral BID WC  . metoprolol tartrate  50 mg Oral BID  . zolpidem  5 mg Oral Once    Physical Exam: General appearance: alert, moderately obese, cooperative and no distress Lungs: Clear Heart: RRR with extra sytole Extremities: 1+ LEE Pulses: 2+ and symmetric Skin: Warm and dry Neurologic: Grossly normal   Rate: 50-115  Rhythm: SR, SB currently  Lab Results: No results for input(s): WBC, HGB, PLT in the last 72 hours.  Recent Labs  02/13/15 0509  NA 138  K 4.3  CL 104  CO2 25  GLUCOSE 128*  BUN 19  CREATININE 0.94   No results for input(s): TROPONINI in the last 72 hours.  Invalid input(s): CK, MB No results for input(s): INR in the last 72 hours.  Meds . apixaban  5 mg Oral BID  . cefUROXime  500 mg Oral BID WC  . metoprolol tartrate  50 mg Oral BID  . zolpidem  5 mg Oral Once      Assessment/Plan:  81 y.o. female with a PMH of PAT, OSA versus obesity hypoventilation syndrome as well as pulmonary hypertension at least in part secondary to previous venous thromboembolic disease (bilateral pulmonary embolism following bilateral knee replacement), with subsequent right heart failure. Echocardiography has estimated PA pressures as high as 73 mmHg but invasive evaluation right heart  catheterization December 2015 showed a PA pressure 43/12 (mean 25) mm Hg. however right ventricular strain has been noted both on echocardiography and CT angiography of the chest. She no longer takes anticoagulation since December 2015.   Pt admitted 02/10/15 with nasuea and weakness after a course of Azithromycin as an OP for URI. On admission she had am abnormal EKG with prolonged QTc, TWI, and new RBBB. Her Troponin was also slightly elevated. Since admission she has had transient junctional rhythm as well as PAT. Her verapamil was stopped, lopressor decreased. Myoview 02/12/15 was low risk.   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  Metoprolol increased to 50 mg BID. She has seen Dr Rayann Heman in the past. She wants to go home today.  [facility] has asked for EP consult before discharge secondary to SSS- HR 45-115. Erlene Quan   PA-C Beeper [phone]  02/15/2015, 8:34 AM     Personally seen and examined. Agree with above. Mild tachycardic, CTAB PAT on tele noted (120bpm) Has struggled with this in past, Seen Dr. Rayann Heman.  Per request of Dr. Clementeen Graham (understandibly so) we have called [facility] to let EP know to consult for further recs.   Candee Furbish, MD

## 2015-02-15 NOTE — Progress Notes (Signed)
INITIAL NUTRITION ASSESSMENT  DOCUMENTATION CODES Per approved criteria  -Obesity Unspecified   INTERVENTION: Ensure Enlive po BID, each supplement provides 350 kcal and 20 grams of protein Encouraged PO intake  NUTRITION DIAGNOSIS: Inadequate oral intake related to nausea and poor appetite as evidenced by <25% meal completion for the past 5 days.   Goal: Pt to meet >/= 90% of their estimated nutrition needs   Monitor:  PO intake, supplement acceptance, weight trend, labs  Reason for Assessment: Consult for Assessment of Nutrition Status/Requirements  81 y.o. female  Admitting Dx: Abnormal EKG  ASSESSMENT: 81 year old female with paroxysmal SVT, hypertension, history of DVT and PE, moderate pulmonary hypertension who presented with 3-4 days of shortness of breath associated with nonproductive cough.   Pt states that she was eating well PTA with 3 meals daily but, since admission she has had nausea and has only been eating a few spoonfuls of food at each meal. She reports that nausea has resolved with medication but, her appetite remains poor. Per nursing notes pt is eating 0-25% of meals. No evidence of weight loss. RD stressed the importance of getting consistent and adequate nutrition. Encouraged pt to eat snacks more frequently throughout the day to improves PO intake and encouraged intake of nutritional supplements until appetite improves. Emphasized the importance of getting adequate protein daily.   Labs reviewed.   Height: Ht Readings from Last 1 Encounters:  02/10/15 5\' 6"  (1.676 m)    Weight: Wt Readings from Last 1 Encounters:  02/15/15 213 lb 11.2 oz (96.934 kg)    Ideal Body Weight: 130 lbs  % Ideal Body Weight: 164%  Wt Readings from Last 10 Encounters:  02/15/15 213 lb 11.2 oz (96.934 kg)  02/01/15 206 lb 8 oz (93.668 kg)  01/23/15 207 lb 4 oz (94.008 kg)  12/20/14 208 lb (94.348 kg)  11/27/14 205 lb 12.8 oz (93.35 kg)  10/24/14 202 lb (91.627 kg)   10/17/14 206 lb (93.441 kg)  10/13/14 203 lb 4 oz (92.194 kg)  10/06/14 204 lb 6 oz (92.704 kg)  09/11/14 200 lb (90.719 kg)    Usual Body Weight: 206 lbs  % Usual Body Weight: 103%  BMI:  Body mass index is 34.51 kg/(m^2).  Estimated Nutritional Needs: Kcal: 1700-1900 Protein: 85-100 grams Fluid: 1.7-1.9 L/day  Skin: intact  Diet Order: Diet Heart Room service appropriate?: Yes; Fluid consistency:: Thin  EDUCATION NEEDS: -No education needs identified at this time   Intake/Output Summary (Last 24 hours) at 02/15/15 1535 Last data filed at 02/15/15 1300  Gross per 24 hour  Intake    960 ml  Output    151 ml  Net    809 ml    Last BM: 4/20  Labs:   Recent Labs Lab 02/10/15 1106 02/10/15 1614 02/12/15 0421 02/13/15 0509  NA 134*  --  140 138  K 4.5  --  4.3 4.3  CL 97  --  106 104  CO2 24  --  25 25  BUN 23  --  15 19  CREATININE 1.05  --  0.94 0.94  CALCIUM 10.1  --  8.6 8.5  MG  --  1.7  --   --   PHOS  --  3.4  --   --   GLUCOSE 151*  --  122* 128*    CBG (last 3)  No results for input(s): GLUCAP in the last 72 hours.  Scheduled Meds: . apixaban  5 mg Oral BID  .  cefUROXime  500 mg Oral BID WC  . metoprolol tartrate  50 mg Oral BID  . ondansetron  4 mg Oral 3 times per day  . zolpidem  5 mg Oral Once    Continuous Infusions:   Past Medical History  Diagnosis Date  . Osteopenia   . Hyperlipidemia   . Hypertension   . PSVT (paroxysmal supraventricular tachycardia)   . Spinal stenosis   . Overactive bladder     urge incontinence  . Pulmonary emboli   . Pulmonary hypertension   . Right ventricular failure   . Arthritis     knee  . Anemia   . Diverticulosis   . Sleep apnea     CPAP  . Hx of colonic polyps 10/24/2014    Past Surgical History  Procedure Laterality Date  . Cataract extraction Bilateral 2008  . Bladder suspension  1993  . Total knee arthroplasty Left 2000  . Total knee revision  01/12/2012    Procedure: TOTAL  KNEE REVISION;  Surgeon: Kerin Salen, MD;  Location: [facility];  Service: Orthopedics;  Laterality: Left;  DEPUY/ LCS , HAND SET  . Trigger finger release  01/12/2012    Procedure: RELEASE TRIGGER FINGER/A-1 PULLEY;  Surgeon: Kerin Salen, MD;  Location: [place];  Service: Orthopedics;  Laterality: Right;  . Cholecystectomy    . Incisional hernia repair    . Total knee arthroplasty Left 10/29/2012    at [facility]  . Total knee arthroplasty Right 03/09/2014  . Colonoscopy    . Right heart catheterization N/A 07/04/2014    Procedure: RIGHT HEART CATH;  Surgeon: Jolaine Artist, MD;  [facility] C[facility] LAB;  Service: Cardiovascular;  Laterality: N/A;  . Tonsillectomy and adenoidectomy    . Colonoscopy N/A 10/24/2014    Procedure: COLONOSCOPY;  Surgeon: Gatha Mayer, MD;  Location: [facility] ENDOSCOPY;  Service: Endoscopy;  Laterality: N/A;    Pryor Ochoa RD, LDN Inpatient Clinical Dietitian Pager: [phone] After Hours Pager: [phone]

## 2015-02-15 NOTE — Discharge Summary (Signed)
Physician Discharge Summary  MADEEHA COSTANTINO DXA:[id number] DOB: 1934/05/30 DOA: 02/10/2015  PCP: Loura Pardon, MD  Admit date: 02/10/2015 Discharge date: 02/15/2015  Time spent: 35 minutes  Recommendations for Outpatient Follow-up:  1. D/C home with outpt PCP and EP follow up 2. Pt started on eliquis upon discharge.  Discharge Diagnoses:  Principal Problem: PSVT with ?SSS   Abnormal EKG-TWI, RBBB, prolonged QT  Active Problems:   Lobar pneumonia   Dyslipidemia   Essential hypertension   Pulmonary hypertension, moderate to severe   OSA (obstructive sleep apnea)   Nausea & vomiting   Elevated troponin   RBBB-new   Prolonged Q-T interval on ECG- (on Azithromycin)    History of bilat PE after TKR (off Coumadin Dec 2015)   prediabetes      Discharge Condition: FAIR  Diet recommendation: heart healthy  Filed Weights   02/13/15 0559 02/14/15 0501 02/15/15 0612  Weight: 96.888 kg (213 lb 9.6 oz) 96.888 kg (213 lb 9.6 oz) 96.934 kg (213 lb 11.2 oz)    History of present illness:  81 year old female with paroxysmal SVT, hypertension, history of DVT and PE, moderate pulmonary hypertension who presented with 3-4 days of shortness of breath associated with nonproductive cough. Patient was prescribed azithromycin as outpatient without much improvement. In the ED patient was found to have a junctional rhythm with prolonged QTC and mildly elevated troponin. Also had elevated lactic acid. Patient admitted for ACS rule out and possible pneumonia.  Hospital Course:  Community acquired pneumonia Patient empirically treated with vancomycin and aztreonam. cx negative. Cultures negative. Switched to oral Ceftin. Completed abx   PSVT with ?SSS Cardiology following and added Eliquis. Metoprolol dose further increased to 50 mg twice a day as patient tachycardic up to 130s on the monitor. Discontinued verapamil this admission. HR fluctuating from 40s to 120s. Seen by EP( Dr Lovena Le). Pt follows  with Dr Rayann Heman as outpt and refused ablation in the past. Currently asymptomatic. Recommend to f/up with Dr Rayann Heman next month. Added eliquis as per cardiology recommendation.  Abnormal EKG with T-wave inversion and RBBB, prolonged QTC and elevated troponin on admission. Normal Lexiscan Myoview. EF of 57% repeat EKG shows junctional rhythm with normal QTC.  Prediabetes A1C of 5.9 . instructed on dietary modifications       DIET: Heart healthy  Code Status: full code Family Communication: d/w daughter at [place] Disposition Plan: home with outpt follow up   Consultants:  cardiology  Procedures:  lexiscan myoview  Antibiotics: vanco and aztreonam 4/16-4/18  ceftin 4/19--4/21  Discharge Exam: Filed Vitals:   02/15/15 1300  BP: 99/65  Pulse: 57  Temp: 97.6 F (36.4 C)  Resp: 18     General: Elderly female in no acute distress  HEENT: No pallor, moist oral mucosa, supple neck  Chest: clear b/l, no rhonchi or wheeze  CVS: S1 and S2 regular, no murmurs  GI: Soft, nondistended, nontender, bowel sounds present   Musculoskeletal: Warm, no edema  CNS: Alert and oriented  Discharge Instructions    Current Discharge Medication List    START taking these medications   Details  apixaban (ELIQUIS) 5 MG TABS tablet Take 1 tablet (5 mg total) by mouth 2 (two) times daily. Qty: 60 tablet, Refills: 0    ondansetron (ZOFRAN) 4 MG tablet Take 1 tablet (4 mg total) by mouth every 8 (eight) hours as needed for nausea or vomiting. Qty: 20 tablet, Refills: 0      CONTINUE these medications which have CHANGED  Details  metoprolol (LOPRESSOR) 50 MG tablet Take 1 tablet (50 mg total) by mouth 2 (two) times daily. Qty: 60 tablet, Refills: 0      CONTINUE these medications which have NOT CHANGED   Details  benzonatate (TESSALON) 200 MG capsule Take 1 capsule (200 mg total) by mouth 2 (two) times daily as needed for cough. Qty: 20 capsule, Refills: 0     Calcium Carb-Cholecalciferol (CALCIUM 600/VITAMIN D3) 600-800 MG-UNIT TABS Take 1 tablet by mouth 2 (two) times daily.    diphenhydrAMINE (BENADRYL) 25 mg capsule Take 25 mg by mouth every morning.     Multiple Vitamin (MULTIVITAMIN WITH MINERALS) TABS tablet Take 1 tablet by mouth daily.    OXYGEN Inhale 2 L/min into the lungs at bedtime.    polyethylene glycol (MIRALAX / GLYCOLAX) packet Take 8.5 g by mouth daily.     vitamin C (ASCORBIC ACID) 500 MG tablet Take 500 mg by mouth daily.      STOP taking these medications     verapamil (CALAN-SR) 120 MG CR tablet      azithromycin (ZITHROMAX) 250 MG tablet        Allergies  Allergen Reactions  . Penicillins Hives  . Sulfonamide Derivatives Hives  . Adhesive [Tape] Other (See Comments)    redness  . Prednisone Other (See Comments)    Causes insomnia   Follow-up Information    Follow up with Loura Pardon, MD.   Specialties:  Family Medicine, Radiology   Contact information:   [address] [place] [address] [phone]       Follow up with Thompson Grayer, MD On 03/21/2015.   Specialty:  Cardiology   Why:  at 3:45PM   Contact information:   [place] [address] [phone]       Follow up with Loura Pardon, MD. Schedule an appointment as soon as possible for a visit in 1 week.   Specialties:  Family Medicine, Radiology   Contact information:   [address] [place] [address] [phone]        The results of significant diagnostics from this hospitalization (including imaging, microbiology, ancillary and laboratory) are listed below for reference.    Significant Diagnostic Studies: Nm Myocar Multi W/spect W/wall Motion / Ef  02/12/2015   CLINICAL DATA:  Ms. Eliberto Ivory is a 81 year old female who is admitted to the hospital with episodes of chest pain. She was scheduled for a [place] study for further  evaluation.  EXAM: MYOCARDIAL IMAGING WITH SPECT (REST AND PHARMACOLOGIC-STRESS)  GATED LEFT VENTRICULAR WALL MOTION STUDY  LEFT VENTRICULAR EJECTION FRACTION  TECHNIQUE: Standard myocardial SPECT imaging was performed after resting intravenous injection of 10 mCi Tc-76m sestamibi. Subsequently, intravenous infusion of Lexiscan was performed under the supervision of the Cardiology staff. At peak effect of the drug, 30 mCi Tc-20m sestamibi was injected intravenously and standard myocardial SPECT imaging was performed. Quantitative gated imaging was also performed to evaluate left ventricular wall motion, and estimate left ventricular ejection fraction.  COMPARISON:  None.  FINDINGS: Review of the EKG tracings reveals no significant ST or T wave changes.  Raw data images:  There is no significant motion artifact.  Stress images: There is smooth and homogeneous uptake in all areas of the myocardium.  Resting images: Smooth and homogeneous uptake in all areas of the myocardium. There is no evidence of ischemia or infarction.  Wall Motion:  The left ventricle systolic function is normal.  Left Ventricular Ejection Fraction: 57 %  End diastolic volume 38 ml  End systolic volume 16 ml  IMPRESSION: 1.  Normal Lexiscan Myoview study.  2.  The left ventricle systolic function is normal  3. Left ventricular ejection fraction 57%  *2012 Appropriate Use Criteria for Coronary Revascularization Focused Update: J Am Coll Cardiol. 9485;46(2):703-500. http://content.airportbarriers.com.aspx?articleid=1201161  Mertie Moores, MD, [facility]   Electronically Signed   By: Mertie Moores   On: 02/12/2015 16:45   Dg Chest Port 1 View  02/10/2015   CLINICAL DATA:  Acute onset of weakness. History of right ventricular failure and hypertension.  EXAM: PORTABLE CHEST - 1 VIEW  COMPARISON:  05/30/2014  FINDINGS: The heart is mildly enlarged. There is tortuosity and mild ectasia of the thoracic aorta. There are chronic emphysematous and  bronchitic lung changes but no acute overlying pulmonary process. No pleural effusion. The bony thorax is intact.  IMPRESSION: Mild cardiac enlargement.  Chronic lung changes but no acute overlying pulmonary process.   Electronically Signed   By: Marijo Sanes M.D.   On: 02/10/2015 13:25    Microbiology: Recent Results (from the past 240 hour(s))  Urine culture     Status: None   Collection Time: 02/10/15  1:03 PM  Result Value Ref Range Status   Specimen Description URINE, CATHETERIZED  Final   Special Requests NONE  Final   Colony Count NO GROWTH Performed at [facility]   Final   Culture NO GROWTH Performed at [facility]   Final   Report Status 02/12/2015 FINAL  Final  Blood Culture (routine x 2)     Status: None (Preliminary result)   Collection Time: 02/10/15  2:40 PM  Result Value Ref Range Status   Specimen Description BLOOD RIGHT ANTECUBITAL  Final   Special Requests BOTTLES DRAWN AEROBIC AND ANAEROBIC 10MLS  Final   Culture   Final           BLOOD CULTURE RECEIVED NO GROWTH TO DATE CULTURE WILL BE HELD FOR 5 DAYS BEFORE ISSUING A FINAL NEGATIVE REPORT Performed at [facility]    Report Status PENDING  Incomplete  Blood Culture (routine x 2)     Status: None (Preliminary result)   Collection Time: 02/10/15  2:45 PM  Result Value Ref Range Status   Specimen Description BLOOD LEFT ANTECUBITAL  Final   Special Requests BOTTLES DRAWN AEROBIC AND ANAEROBIC 5MLS  Final   Culture   Final           BLOOD CULTURE RECEIVED NO GROWTH TO DATE CULTURE WILL BE HELD FOR 5 DAYS BEFORE ISSUING A FINAL NEGATIVE REPORT Performed at [facility]    Report Status PENDING  Incomplete  Culture, blood (routine x 2) Call MD if unable to obtain prior to antibiotics being given     Status: None (Preliminary result)   Collection Time: 02/10/15  6:05 PM  Result Value Ref Range Status   Specimen Description BLOOD RIGHT ARM  Final   Special Requests BOTTLES DRAWN  AEROBIC AND ANAEROBIC 5CC EA  Final   Culture   Final           BLOOD CULTURE RECEIVED NO GROWTH TO DATE CULTURE WILL BE HELD FOR 5 DAYS BEFORE ISSUING A FINAL NEGATIVE REPORT Performed at [facility]    Report Status PENDING  Incomplete  Culture, blood (routine x 2) Call MD if unable to obtain prior to antibiotics  being given     Status: None (Preliminary result)   Collection Time: 02/10/15  6:15 PM  Result Value Ref Range Status   Specimen Description BLOOD RIGHT ARM  Final   Special Requests BOTTLES DRAWN AEROBIC AND ANAEROBIC B 4CC R 3CC  Final   Culture   Final           BLOOD CULTURE RECEIVED NO GROWTH TO DATE CULTURE WILL BE HELD FOR 5 DAYS BEFORE ISSUING A FINAL NEGATIVE REPORT Performed at [facility]    Report Status PENDING  Incomplete  Clostridium Difficile by PCR     Status: None   Collection Time: 02/14/15  5:00 PM  Result Value Ref Range Status   C difficile by pcr NEGATIVE NEGATIVE Final     Labs: Basic Metabolic Panel:  Recent Labs Lab 02/10/15 1106 02/10/15 1614 02/12/15 0421 02/13/15 0509  NA 134*  --  140 138  K 4.5  --  4.3 4.3  CL 97  --  106 104  CO2 24  --  25 25  GLUCOSE 151*  --  122* 128*  BUN 23  --  15 19  CREATININE 1.05  --  0.94 0.94  CALCIUM 10.1  --  8.6 8.5  MG  --  1.7  --   --   PHOS  --  3.4  --   --    Liver Function Tests:  Recent Labs Lab 02/10/15 1106  AST 37  ALT 19  ALKPHOS 66  BILITOT 1.1  PROT 7.1  ALBUMIN 3.2*    Recent Labs Lab 02/10/15 1106  LIPASE 23   No results for input(s): AMMONIA in the last 168 hours. CBC:  Recent Labs Lab 02/10/15 1106 02/11/15 1213 02/12/15 0421  WBC 10.4 7.9 8.2  NEUTROABS 7.4  --   --   HGB 14.2 13.4 12.3  HCT 44.5 42.7 39.7  MCV 92.1 92.6 93.0  PLT 192 167 181   Cardiac Enzymes:  Recent Labs Lab 02/10/15 1614 02/10/15 2205 02/11/15 0352  TROPONINI 0.48* 0.35* 0.32*   BNP: BNP (last 3 results) No results for input(s): BNP in the last 8760  hours.  ProBNP (last 3 results) No results for input(s): PROBNP in the last 8760 hours.  CBG: No results for input(s): GLUCAP in the last 168 hours.     SignedLouellen Molder  [facility] 02/15/2015, 4:45 PM

## 2015-02-16 ENCOUNTER — Telehealth: Payer: Self-pay

## 2015-02-16 ENCOUNTER — Telehealth: Payer: Self-pay | Admitting: Internal Medicine

## 2015-02-16 MED ORDER — METOPROLOL TARTRATE 50 MG PO TABS: 25.0000 mg | ORAL_TABLET | Freq: Two times a day (BID) | ORAL | Status: DC

## 2015-02-16 NOTE — Telephone Encounter (Signed)
New message    daughter calling stating her mother is not feeling well today.  Patient was discharge from the hospital on yesterday.    C/O pulse is low.    Patient daughter stating he Oxygen level is  89 Without the nasal canal.  Patient stating her abdomen is hurting.    B/p  108/107 home machine.

## 2015-02-16 NOTE — Telephone Encounter (Signed)
PATIENT'S DAUGHTER CALLED BECAUSE PATIENT WAS STARTED ON ELIQUIS, WHEN SHE WENT TO GET IT FILL IT WAS 300.00 BECAUSE IT NEEDED A  PA DONE.  I TOLD HER THAT I WOULD GET THE PA DONE BUT WE HAD A COPAY CARD TO GET A FREE 30 TRIAL OFFER , I PLACED THE CARD UP FRONT. I CALLED THE PHARMACY TO GET THE PHONE NUMBER AND MEMBER ID NUMBER, BUT THIS WAS A CHF PATIENT, SO I CALLED AND GAVE THE INFRO TO CHANTEL

## 2015-02-16 NOTE — Telephone Encounter (Signed)
Spoke with daughter and her mom's heart rate is running 42-48.  She just feels drained.  I discussed with Dr Lovena Le and asked her to decrease her Metoprolol to 25 mg twice daily and call back if she is not feeling better by Mon.

## 2015-02-17 LAB — CULTURE, BLOOD (ROUTINE X 2)
CULTURE: NO GROWTH
CULTURE: NO GROWTH
CULTURE: NO GROWTH
Culture: NO GROWTH

## 2015-02-19 ENCOUNTER — Telehealth: Payer: Self-pay | Admitting: Family Medicine

## 2015-02-19 ENCOUNTER — Telehealth: Payer: Self-pay | Admitting: Internal Medicine

## 2015-02-19 ENCOUNTER — Encounter: Payer: Self-pay | Admitting: Nurse Practitioner

## 2015-02-19 ENCOUNTER — Ambulatory Visit: Payer: Self-pay | Admitting: Internal Medicine

## 2015-02-19 ENCOUNTER — Ambulatory Visit (INDEPENDENT_AMBULATORY_CARE_PROVIDER_SITE_OTHER): Payer: Medicare Other | Admitting: Nurse Practitioner

## 2015-02-19 ENCOUNTER — Telehealth (HOSPITAL_COMMUNITY): Payer: Self-pay | Admitting: *Deleted

## 2015-02-19 VITALS — BP 100/68 | HR 60 | Resp 18 | Ht 66.0 in | Wt 222.0 lb

## 2015-02-19 DIAGNOSIS — R0600 Dyspnea, unspecified: Secondary | ICD-10-CM

## 2015-02-19 DIAGNOSIS — I471 Supraventricular tachycardia: Secondary | ICD-10-CM | POA: Diagnosis not present

## 2015-02-19 DIAGNOSIS — R001 Bradycardia, unspecified: Secondary | ICD-10-CM | POA: Diagnosis not present

## 2015-02-19 DIAGNOSIS — I10 Essential (primary) hypertension: Secondary | ICD-10-CM

## 2015-02-19 DIAGNOSIS — R5383 Other fatigue: Secondary | ICD-10-CM

## 2015-02-19 LAB — BASIC METABOLIC PANEL
BUN: 33 mg/dL — ABNORMAL HIGH (ref 6–23)
CO2: 33 mEq/L — ABNORMAL HIGH (ref 19–32)
Calcium: 9.6 mg/dL (ref 8.4–10.5)
Chloride: 101 mEq/L (ref 96–112)
Creatinine, Ser: 0.98 mg/dL (ref 0.40–1.20)
GFR: 58.14 mL/min — ABNORMAL LOW (ref >60.00)
Glucose, Bld: 109 mg/dL — ABNORMAL HIGH (ref 70–99)
Potassium: 4.5 mEq/L (ref 3.5–5.1)
Sodium: 135 mEq/L (ref 135–145)

## 2015-02-19 LAB — CBC
HCT: 39 % (ref 36.0–46.0)
Hemoglobin: 12.7 g/dL (ref 12.0–15.0)
MCHC: 32.5 g/dL (ref 30.0–36.0)
MCV: 91.6 fl (ref 78.0–100.0)
Platelets: 186 10*3/uL (ref 150.0–400.0)
RBC: 4.26 Mil/uL (ref 3.87–5.11)
RDW: 16.1 % — ABNORMAL HIGH (ref 11.5–15.5)
WBC: 6.8 10*3/uL (ref 4.0–10.5)

## 2015-02-19 LAB — BRAIN NATRIURETIC PEPTIDE: Pro B Natriuretic peptide (BNP): 1306 pg/mL — ABNORMAL HIGH (ref 0.0–100.0)

## 2015-02-19 MED ORDER — CEFUROXIME AXETIL 250 MG PO TABS: 250.0000 mg | ORAL_TABLET | Freq: Two times a day (BID) | ORAL | Status: DC

## 2015-02-19 NOTE — Telephone Encounter (Signed)
New MEssage   Pt c/o of Chest Pain: STAT if CP now or developed within 24 hours  1. Are you having CP right now? no  2. Are you experiencing any other symptoms (ex. SOB, nausea, vomiting, sweating)? SoB (last night), taking nausea pills  3. How long have you been experiencing CP? For about 45 minutes (per pt daughter)  4. Is your CP continuous or coming and going? Came and went   5. Have you taken Nitroglycerin? No- never ?

## 2015-02-19 NOTE — Patient Instructions (Addendum)
We will be checking the following labs today - BMET, CBC, BNP   Medication Instructions:    Continue with your current medicines.   I am adding back Ceftin - this is an antibiotic - this is at your drug store    Testing/Procedures To Be Arranged:  N/A  Follow-Up:   See Dr. [name] as planned on Wednesday  If symptoms worsen - go back to the hospital    Other Special Instructions:   N/A  Call the [place] office at [phone] if you have any questions, problems or concerns.

## 2015-02-19 NOTE — Telephone Encounter (Signed)
PA for eliquis was denied.  I submitted an appeal but when i spoke with [facility] they told me it was likely to get denied as well because Eliquis is a benefit exclusion and drug coverage is limited to those drugs specifically outlined in the plan.  Pts medicare advantage plan does not cover outpatient prescription drugs.

## 2015-02-19 NOTE — Progress Notes (Signed)
CARDIOLOGY OFFICE NOTE  Date:  02/19/2015    [name] Date of Birth: September 02, 1934 Medical Record #[id number]  PCP:  Loura Pardon, MD  Cardiologist: Allred    Chief Complaint  Patient presents with  . Fatigue    Work in visit -seen for Dr. Rayann Heman.  . Bradycardia     History of Present Illness: [name] is a 81 y.o. female who presents today for a work in visit. Seen for Dr. Rayann Heman. She has a history of DVT/PE (off anticoagulation since 09/2014), HTN, pulmonary HTN, long history of SVT that has been managed medically and OSA. Other medical problems as noted below. She has declined ablation in the past.   Last seen here by Dr. Rayann Heman in July of 2015.  She was admitted earlier this month - just discharged 4 days ago - had PSVT with ?SSS and prolonged QT on EKG. This was in the setting of worsening shortness of breath - treated with outpatient antibiotics initially but failed on therapy - was treated with IV antibiotics for lobar pneumonia. Troponin mildly elevated.  Cardiology saw the patient - added Eliquis and increased her beta blocker - Verapamil was stopped. Myoview was normal. Echo with normal EF - sell below.    Phone call today - Patient continues to have worsening symptoms of fatigue, weakness and low HR (40's). She also has mild B ankle edema and headache that started today. BP this morning 120/80. Decreased appetite and activity. Reviewed with Janan Halter, RN, as Dr. Rayann Heman had been talking to this patient on Friday regarding these symptoms. He indicated if she was still feeling poorly with no improvement to call back today to follow up. Patient scheduled to see Truitt Merle, NP, today 4/25 @2 :30p  Phone call from Friday -  Spoke with daughter and her mom's heart rate is running 42-48. She just feels drained. I discussed with Dr Lovena Le and asked her to decrease her Metoprolol to 25 mg twice daily and call back if she is not feeling better by Mon.   Thus added  to the FLEX for today.   Comes in today. Here with daughter and fiance'. She does not feel well. Feels puny. No energy. Not eating. Still coughing but not productive. Last night felt short of breath. Using oxygen more for sats down in the 89% range per daughter. Pretty sedentary. Some palpitations - one or two spells since she cut the lopressor back. She has not been lightheaded or dizzy. No syncope reported. Finished antibiotics at discharge from [facility]. Seeing PCP on Wednesday and pulmonary tomorrow.   Past Medical History  Diagnosis Date  . Osteopenia   . Hyperlipidemia   . Hypertension   . PSVT (paroxysmal supraventricular tachycardia)   . Spinal stenosis   . Overactive bladder     urge incontinence  . Pulmonary emboli   . Pulmonary hypertension   . Right ventricular failure   . Arthritis     knee  . Anemia   . Diverticulosis   . Sleep apnea     CPAP  . Hx of colonic polyps 10/24/2014    Past Surgical History  Procedure Laterality Date  . Cataract extraction Bilateral 2008  . Bladder suspension  1993  . Total knee arthroplasty Left 2000  . Total knee revision  01/12/2012    Procedure: TOTAL KNEE REVISION;  Surgeon: Kerin Salen, MD;  Location: [facility];  Service: Orthopedics;  Laterality: Left;  DEPUY/ LCS , HAND SET  .  Trigger finger release  01/12/2012    Procedure: RELEASE TRIGGER FINGER/A-1 PULLEY;  Surgeon: Kerin Salen, MD;  Location: [facility];  Service: Orthopedics;  Laterality: Right;  . Cholecystectomy    . Incisional hernia repair    . Total knee arthroplasty Left 10/29/2012    at [facility]  . Total knee arthroplasty Right 03/09/2014  . Colonoscopy    . Right heart catheterization N/A 07/04/2014    Procedure: RIGHT HEART CATH;  Surgeon: Jolaine Artist, MD;  Location: [facility] CATH LAB;  Service: Cardiovascular;  Laterality: N/A;  . Tonsillectomy and adenoidectomy    . Colonoscopy N/A 10/24/2014    Procedure: COLONOSCOPY;  Surgeon: Gatha Mayer, MD;  Location: [facility] ENDOSCOPY;   Service: Endoscopy;  Laterality: N/A;     Medications: Current Outpatient Prescriptions  Medication Sig Dispense Refill  . apixaban (ELIQUIS) 5 MG TABS tablet Take 1 tablet (5 mg total) by mouth 2 (two) times daily. 60 tablet 0  . benzonatate (TESSALON) 200 MG capsule Take 1 capsule (200 mg total) by mouth 2 (two) times daily as needed for cough. 20 capsule 0  . Calcium Carb-Cholecalciferol (CALCIUM 600/VITAMIN D3) 600-800 MG-UNIT TABS Take 1 tablet by mouth 2 (two) times daily.    . diphenhydrAMINE (BENADRYL) 25 mg capsule Take 25 mg by mouth every morning.     . metoprolol (LOPRESSOR) 50 MG tablet Take 0.5 tablets (25 mg total) by mouth 2 (two) times daily. 60 tablet 0  . Multiple Vitamin (MULTIVITAMIN WITH MINERALS) TABS tablet Take 1 tablet by mouth daily.    . ondansetron (ZOFRAN) 4 MG tablet Take 1 tablet (4 mg total) by mouth every 8 (eight) hours as needed for nausea or vomiting. 20 tablet 0  . OXYGEN Inhale 2 L/min into the lungs at bedtime.    . polyethylene glycol (MIRALAX / GLYCOLAX) packet Take 8.5 g by mouth daily.     . vitamin C (ASCORBIC ACID) 500 MG tablet Take 500 mg by mouth daily.    . cefUROXime (CEFTIN) 250 MG tablet Take 1 tablet (250 mg total) by mouth 2 (two) times daily with a meal. 14 tablet 0   No current facility-administered medications for this visit.    Allergies: Allergies  Allergen Reactions  . Penicillins Hives  . Sulfonamide Derivatives Hives  . Adhesive [Tape] Other (See Comments)    redness  . Prednisone Other (See Comments)    Causes insomnia    Social History: The patient  reports that she has never smoked. She has never used smokeless tobacco. She reports that she does not drink alcohol or use illicit drugs.   Family History: The patient's family history includes Breast cancer in her mother; Diabetes in her father; Heart disease in her father; Hypertension in her father; Uterine cancer in her sister.   Review of Systems: Please see the  history of present illness.    The ROS is quite + for appetite change, chest pain, leg swelling, DOE, cough, dizziness, easy bruising, excessive fatigue, facial swelling off and on, nausea and headaches. All other systems are reviewed and negative.   Physical Exam: VS:  BP 100/68 mmHg  Pulse 60  Resp 18  Ht 5\' 6"  (1.676 m)  Wt 222 lb (100.699 kg)  BMI 35.85 kg/m2  SpO2 97% .  BMI Body mass index is 35.85 kg/(m^2).  Wt Readings from Last 3 Encounters:  02/19/15 222 lb (100.699 kg)  02/15/15 213 lb 11.2 oz (96.934 kg)  02/01/15 206 lb 8  oz (93.668 kg)    General: Chronically ill appearing. She is in no acute distress. She is obese.  HEENT: Normal. Neck: Supple.  Cardiac: Regular rate and rhythm. No murmurs, rubs, or gallops. She has 1+ edema.  Respiratory:  Lungs are with decreased breath sounds due to effort - she has diffuse crisp crackles with normal work of breathing. Oxygen sat is 97%.  GI: Soft and nontender.  MS: No deformity or atrophy. Gait is not tested. She is in a wheelchair.  Skin: Warm and dry. Color is sallow Neuro:  Strength and sensation are intact and no gross focal deficits noted.  Psych: Alert, appropriate and with normal affect.   LABORATORY DATA:  EKG:  EKG is ordered today. This demonstrates sinus bradycardia, poor R wave progression. Reviewed with Dr. Rayann Heman here in the office.  Lab Results  Component Value Date   WBC 8.2 02/12/2015   HGB 12.3 02/12/2015   HCT 39.7 02/12/2015   PLT 181 02/12/2015   GLUCOSE 128* 02/13/2015   CHOL 104 03/14/2014   TRIG 95 03/14/2014   HDL 32* 03/14/2014   LDLCALC 53 03/14/2014   ALT 19 02/10/2015   AST 37 02/10/2015   NA 138 02/13/2015   K 4.3 02/13/2015   CL 104 02/13/2015   CREATININE 0.94 02/13/2015   BUN 19 02/13/2015   CO2 25 02/13/2015   TSH 3.600 03/14/2014   INR 1.24 07/04/2014   HGBA1C 5.8* 02/14/2015    BNP (last 3 results) No results for input(s): BNP in the last 8760 hours.  ProBNP (last 3  results) No results for input(s): PROBNP in the last 8760 hours.   Other Studies Reviewed Today:  MYOVIEW IMPRESSION FROM 01/2015: 1. Normal Lexiscan Myoview study.  2. The left ventricle systolic function is normal  3. Left ventricular ejection fraction 57%  *2012 Appropriate Use Criteria for Coronary Revascularization Focused Update: J Am Coll Cardiol. 6433;29(5):188-416. http://content.airportbarriers.com.aspx?articleid=1201161  Mertie Moores, MD, [facility]   Electronically Signed  By: Mertie Moores  On: 02/12/2015 16:45   Echo 02/12/15 demonstrated EF 55%, mild MR, moderate TR, PA pressure 57, RV severely dilated, small pericardial effusion, LA 37.   Assessment/Plan: 1. Long standing history of SVT - felt to have recently been exacerbated by her recent illness - currently in sinus. Limited options. I have left her on her current dose of Lopressor.   2. Prior prolonged QT that improved off of Zithromax - resolved. Will update her allergies.   3. Elevated troponin - has had recent normal Myoview. No further ischemic work up felt to be necessary.  4. Recent pneumonia - with lots of somatic complaints. Not wanting to go back to the ER but agreeable to restarting antibiotics - seeing PCP on Wednesday and pulmonary tomorrow.   5. Anticoagulation - now on Eliquis  6. Pulmonary HTN  Current medicines are reviewed with the patient today.  The patient does not have concerns regarding medicines other than what has been noted above.  The following changes have been made:  See above.  Labs/ tests ordered today include:    Orders Placed This Encounter  Procedures  . Brain natriuretic peptide  . CBC  . Basic metabolic panel  . EKG 12-Lead     Disposition:   FU as planned. Seeing pulmonary tomorrow and her PCP on Wednesday.    Patient is agreeable to this plan and will call if any problems develop in the interim.   Signed: Burtis Junes, RN,  ANP-C 02/19/2015 3:30  [facility] [address] [place], [place]  [address] Phone: [phone] Fax: [phone]

## 2015-02-19 NOTE — Telephone Encounter (Signed)
PLEASE NOTE: All timestamps contained within this report are represented as [place] Standard Time. CONFIDENTIALTY NOTICE: This fax transmission is intended only for the addressee. It contains information that is legally privileged, confidential or otherwise protected from use or disclosure. If you are not the intended recipient, you are strictly prohibited from reviewing, disclosing, copying using or disseminating any of this information or taking any action in reliance on or regarding this information. If you have received this fax in error, please notify us immediately by telephone so that we can arrange for its return to [place]. Phone: [phone], Toll-Free: [phone], Fax: [phone] Page: 1 of 1 Call Id: [id number] [place] [name] DOB: 1934-03-23 Initial Comment Caller States mother is weak and on 02, nausea, not eating or drinking well, skin is pale in color and cool to the touch. she did have some chest pain last night that lasted about 45 min. but no pain today Nurse Assessment Nurse: Marcelline Deist, RN, Lynda Date/Time (Eastern Time): 02/19/2015 11:50:05 AM Confirm and document reason for call. If symptomatic, describe symptoms. ---Caller states her mother is weak and on 02, nausea, not eating or drinking well, skin is pale in color and cool to the touch. she did have some chest pain last night that lasted about 45 min. but no pain today. Just discharged from hospital on Friday night for borderline pneumonia & dehydration. Hospitalized for 6 days. No longer on antibiotics. Has the patient traveled out of the country within the last 30 days? ---Not Applicable Does the patient require triage? ---Yes Related visit to physician within the last 2 weeks? ---Yes Does the PT have any chronic conditions? (i.e. diabetes, asthma, etc.) ---Yes List chronic conditions. ---needs a  pacemaker, her HR gets very fast Guidelines Guideline Title Affirmed Question Affirmed Notes Weakness (Generalized) and Fatigue [1] MODERATE weakness (i.e., interferes with work, school, normal activities) AND [2] cause unknown (Exceptions: weakness with acute minor illness, or weakness from poor fluid intake) Final Disposition User See Physician within 4 Hours (or PCP triage) Marcelline Deist, RN, Lynda Comments Spoke with 2 of pt's daughters, one is a Marine scientist. States the pt's HR is sometimes in the 40's, other times fast & can see her neck blood vessels throbbing. States she also is having A fib. Is on 2 L of O2. She is not drinking much to avoid needing to go to restroom d/t being weak. Caller feels she has not improved since bringing her home from hospital Friday.

## 2015-02-19 NOTE — Telephone Encounter (Signed)
Patient is currently at cardiology for follow up.

## 2015-02-19 NOTE — Telephone Encounter (Signed)
Pt has /fu appt on 02/21/15 with Dr [name].

## 2015-02-19 NOTE — Telephone Encounter (Signed)
It looks like she has an appt with cardiology at 2:30- I can see that in the computer  Please verify she is going - I do recommend f/u there

## 2015-02-19 NOTE — Telephone Encounter (Signed)
PLEASE NOTE: All timestamps contained within this report are represented as [place] Standard Time. CONFIDENTIALTY NOTICE: This fax transmission is intended only for the addressee. It contains information that is legally privileged, confidential or otherwise protected from use or disclosure. If you are not the intended recipient, you are strictly prohibited from reviewing, disclosing, copying using or disseminating any of this information or taking any action in reliance on or regarding this information. If you have received this fax in error, please notify us immediately by telephone so that we can arrange for its return to [place]. Phone: [phone], Toll-Free: [phone], Fax: [phone] Page: 1 of 2 Call Id: [id number] [place] [name] Gender: Female DOB: 11-14-1933 Age: 81 Y 27 M 14 D Return Phone Number: [phone] (Primary) Address: [address] City/State/Zip: [place] [address] Client  [facility] - Client Client Site [place], [place] Contact Type Call Call Type Triage / [place] Name dianne chilton Relationship To Patient Daughter Appointment Disposition EMR Appointment Scheduled Info pasted into Epic Yes Return Phone Number [phone] (Primary) Chief Complaint Weakness, Generalized Initial Comment Caller States mother is weak and on 02, nausea, not eating or drinking well, skin is pale in color and cool to the touch. she did have some chest pain last night that lasted about 45 min. but no pain today PreDisposition Call Doctor Nurse Assessment Nurse: Marcelline Deist, RN, Kermit Balo Date/Time Eilene Ghazi Time): 02/19/2015 11:50:05 AM Confirm and document reason for call. If symptomatic, describe symptoms. ---Caller states her mother is weak and on 02, nausea, not eating or drinking  well, skin is pale in color and cool to the touch. she did have some chest pain last night that lasted about 45 min. but no pain today. Just discharged from hospital on Friday night for borderline pneumonia & dehydration. Hospitalized for 6 days. No longer on antibiotics. Has the patient traveled out of the country within the last 30 days? ---Not Applicable Does the patient require triage? ---Yes Related visit to physician within the last 2 weeks? ---Yes Does the PT have any chronic conditions? (i.e. diabetes, asthma, etc.) ---Yes List chronic conditions. ---needs a pacemaker, her HR gets very fast Guidelines Guideline Title Affirmed Question Affirmed Notes Nurse Date/Time (Eastern Time) Weakness (Generalized) and Fatigue [1] MODERATE weakness (i.e., interferes with work, school, normal activities) AND [2] cause unknown (Exceptions: weakness with acute minor illness, Marcelline Deist, RN, Kermit Balo 02/19/2015 11:52:58 AM PLEASE NOTE: All timestamps contained within this report are represented as [place] Standard Time. CONFIDENTIALTY NOTICE: This fax transmission is intended only for the addressee. It contains information that is legally privileged, confidential or otherwise protected from use or disclosure. If you are not the intended recipient, you are strictly prohibited from reviewing, disclosing, copying using or disseminating any of this information or taking any action in reliance on or regarding this information. If you have received this fax in error, please notify us immediately by telephone so that we can arrange for its return to [place]. Phone: [phone], Toll-Free: [phone], Fax: [phone] Page: 2 of 2 Call Id: [id number] Guidelines Guideline Title Affirmed Question Affirmed Notes Nurse Date/Time Eilene Ghazi Time) or weakness from poor fluid intake) Disp. Time Eilene Ghazi Time) Disposition Final User 02/19/2015 12:01:17 PM See Physician within 4 Hours (or PCP triage) Yes  Marcelline Deist, RN, Milana Kidney Understands: Yes Disagree/Comply: Comply Care Advice Given Per Guideline SEE PHYSICIAN  WITHIN 4 HOURS (or PCP triage): * IF NO PCP TRIAGE: You need to be seen. Go to _______________ (ED/UCC or office if it will be open) within the next 3 or 4 hours. Go sooner if you become worse. CALL BACK IF: * You become worse. CARE ADVICE given per Weakness and Fatigue (Adult) guideline. BRING MEDICINES: * Please bring a list of your current medicines when you go to see the doctor. After Care Instructions Given Call Event Type User Date / Time Description Comments User: Donald Siva, RN Date/Time Eilene Ghazi Time): 02/19/2015 12:03:58 PM Spoke with 2 of pt's daughters, one is a Marine scientist. States the pt's HR is sometimes in the 40's, other times fast & can see her neck blood vessels throbbing. States she also is having A fib. Is on 2 L of O2. She is not drinking much to avoid needing to go to restroom d/t being weak. Caller feels she has not improved since bringing her home from hospital Friday. Referrals REFERRED TO PCP OFFICE

## 2015-02-19 NOTE — Telephone Encounter (Signed)
Patient continues to have worsening symptoms of fatigue, weakness and low HR (40's). She also has mild B ankle edema and headache that started today. BP this morning 120/80.  Decreased appetite and activity. Reviewed with Janan Halter, RN, as Dr. Rayann Heman had been talking to this patient on Friday regarding these symptoms. He indicated if she was still feeling poorly with no improvement to call back today to follow up. Patient scheduled to see Truitt Merle, NP, today 4/25 @2 :30p

## 2015-02-20 ENCOUNTER — Other Ambulatory Visit: Payer: Self-pay

## 2015-02-20 ENCOUNTER — Other Ambulatory Visit: Payer: Medicare Other

## 2015-02-20 ENCOUNTER — Telehealth: Payer: Self-pay | Admitting: Nurse Practitioner

## 2015-02-20 ENCOUNTER — Encounter: Payer: Self-pay | Admitting: Pulmonary Disease

## 2015-02-20 ENCOUNTER — Ambulatory Visit (INDEPENDENT_AMBULATORY_CARE_PROVIDER_SITE_OTHER): Payer: Medicare Other | Admitting: Pulmonary Disease

## 2015-02-20 VITALS — BP 102/62 | HR 50

## 2015-02-20 DIAGNOSIS — R7989 Other specified abnormal findings of blood chemistry: Secondary | ICD-10-CM

## 2015-02-20 DIAGNOSIS — G4733 Obstructive sleep apnea (adult) (pediatric): Secondary | ICD-10-CM | POA: Diagnosis not present

## 2015-02-20 DIAGNOSIS — R0902 Hypoxemia: Secondary | ICD-10-CM | POA: Diagnosis not present

## 2015-02-20 DIAGNOSIS — J849 Interstitial pulmonary disease, unspecified: Secondary | ICD-10-CM

## 2015-02-20 MED ORDER — FUROSEMIDE 20 MG PO TABS: 20.0000 mg | ORAL_TABLET | Freq: Every day | ORAL | Status: DC

## 2015-02-20 MED ORDER — FUROSEMIDE 20 MG PO TABS: ORAL_TABLET | ORAL | Status: DC

## 2015-02-20 MED ORDER — POTASSIUM CHLORIDE ER 10 MEQ PO TBCR: 10.0000 meq | EXTENDED_RELEASE_TABLET | Freq: Every day | ORAL | Status: DC

## 2015-02-20 MED ORDER — FUROSEMIDE 20 MG PO TABS
20.0000 mg | ORAL_TABLET | Freq: Every day | ORAL | Status: DC
Start: 2015-02-20 — End: 2015-02-20

## 2015-02-20 NOTE — Telephone Encounter (Signed)
Patient's daughter Peter Congo) called back, returned her call about new medication orders and lab results. Per Truitt Merle NP, fluid level test is elevated - start Lasix 40 mg for 3 days, then decrease to 20 mg a day. Start KCL 10 meq daily. BMET and BNP in one week. New medication orders sent to patient's pharmacy. BMET and BNP has been ordered and scheduled. Patient's daughter verbalized understanding.

## 2015-02-20 NOTE — Telephone Encounter (Signed)
Called patient's daughter back. Lasix has been sent to pharmacy.

## 2015-02-20 NOTE — Patient Instructions (Signed)
You may have scarring in your lungs Proceed with Ct scan of lungs Stay on oxygen to maintain saturation 88% & above Oxygen blended into CPAP at night Start lasix 40 mg daily & stay on this dose until follow up with cardiology - if does not urinate enough , call us back & we may have to increase dose

## 2015-02-20 NOTE — Assessment & Plan Note (Addendum)
She does seem to have worsening cor pulmonale -as evidence by pedal edema and weight gain of about 20 pounds. The bibasal crackles on explained this may be due to bronchiectasis noted and CT scan in 2015 or pulmonary fibrosis -we'll obtain high resolution CT scan to clarify Stay on oxygen to maintain saturation 88% & above Start lasix 40 mg daily & stay on this dose until follow up with cardiology - if does not urinate enough , call us back & we may have to increase dose- she will need extensive diuresis as renal function will permit

## 2015-02-20 NOTE — Telephone Encounter (Signed)
New message ° ° ° ° ° ° °Returning Pam's call to get lab results °

## 2015-02-20 NOTE — Progress Notes (Signed)
   Subjective:    Patient ID: [name], female    DOB: 1934/01/19, 81 y.o.   MRN: [id number]  HPI  81 year old woman with pulmonary hypertension, initially referred from CHF clinic, for FU of OSA She is maintained on nocturnal CPAP with 2 L oxygen blended  She had a complicated course after left knee replacement in 2013 due to a DVT/PE.  She presented with exertional dyspnea and fatigue, echo suggested severe pulmonary hypertension, right heart cath however showed only mild pulmonary hypertension with normal resistance and low wedge. Pulmonary embolism was ruled out. Sleep study showed severe OSA, hence the referral. She underwent right TKR in 03/15/14 without problems. Off xarelto  Significant tests/ events  Echo 03/10/14: LVEF 55-60% with flat septum. RV moderately dilated. Moderate to severe TR. Mild HK RVSP 73 mmHg  RHC (06/2014)  RA mean 6  PA 43/12, mean 25  PCWP mean 16  PVR 1.6 WU  CI 2.9   PFTs (05/25/14) FEV1 1.88 L (88%) FVC 2.22 L (77%) ratio 114, DLCO 68%    CT chest 02/2014 - no PE , mild bronchiectasis at bases, Areas of mild dependent subsegmental atelectasis throughout the lower lobes of the lungs bilaterally VQ 05/30/14: Low prob   PSG 06/2014 >> severe OSA, AHI 62 per hour, corrected by CPAP 12 cm with small fullface mask. Central apneas emergent higher pressures  Download on 12 cm 08/2014 >> AHI 3/h, >6h usage, AHI 3/h ONO on CPPA/RA shows 71 min desatn -Add 2L O2 to CPAP ONO on CPAP/2 L O2 >> no desatn  Echo 02/12/15 demonstrated EF 55%, mild MR, moderate TR, PA pressure 57, RV severely dilated, small pericardial effusion, LA 37.                      02/20/2015  Chief Complaint  Patient presents with  . Follow-up    Pneumonia, heart rate going up and down, SOB, dry cough; not using CPAP machine during time she has been sick   Accompanied by daughter and fianc Fritz Picke[place] Adm 4/16-4/21/16 for cough, lactate 3.7, diagnosed with  'pneumonia' ,  tachycardia -metoprolol increased -review of chest x-ray does not show focal infiltrate usage on download 12 cm 11/2014 has dropped to 2h Usage remains low 01/2015    Review of Systems neg for any significant sore throat, dysphagia, itching, sneezing, nasal congestion or excess/ purulent secretions, fever, chills, sweats, unintended wt loss, pleuritic or exertional cp, hempoptysis, orthopnea pnd or change in chronic leg swelling. Also denies presyncope, palpitations, heartburn, abdominal pain, nausea, vomiting, diarrhea or change in bowel or urinary habits, dysuria,hematuria, rash, arthralgias, visual complaints, headache, numbness weakness or ataxia.     Objective:   Physical Exam  Gen. Pleasant, obese, in no distress, in wheelchair on 2 L oxygen normal affect ENT - no lesions, no post nasal drip Neck: No JVD, no thyromegaly, no carotid bruits Lungs: no use of accessory muscles, no dullness to percussion, bibasal crackles Cardiovascular: Rhythm regular, heart sounds  normal, no murmurs or gallops, 2+ peripheral edema Abdomen: soft and non-tender, no hepatosplenomegaly, BS normal. Musculoskeletal: No deformities, no cyanosis or clubbing Neuro:  alert, non focal, no tremors       Assessment & Plan:

## 2015-02-20 NOTE — Telephone Encounter (Signed)
Follow Up        Pt's daughter calling stating that pt's rx for Lasix wasn't called in. Please call back and advise.

## 2015-02-21 ENCOUNTER — Encounter: Payer: Self-pay | Admitting: Family Medicine

## 2015-02-21 ENCOUNTER — Ambulatory Visit (INDEPENDENT_AMBULATORY_CARE_PROVIDER_SITE_OTHER): Payer: Medicare Other | Admitting: Family Medicine

## 2015-02-21 VITALS — BP 112/64 | HR 53 | Temp 97.5°F | Ht 66.0 in

## 2015-02-21 DIAGNOSIS — R531 Weakness: Secondary | ICD-10-CM | POA: Diagnosis not present

## 2015-02-21 DIAGNOSIS — I2609 Other pulmonary embolism with acute cor pulmonale: Secondary | ICD-10-CM | POA: Diagnosis not present

## 2015-02-21 DIAGNOSIS — I4581 Long QT syndrome: Secondary | ICD-10-CM

## 2015-02-21 DIAGNOSIS — I471 Supraventricular tachycardia: Secondary | ICD-10-CM | POA: Diagnosis not present

## 2015-02-21 DIAGNOSIS — R9431 Abnormal electrocardiogram [ECG] [EKG]: Secondary | ICD-10-CM

## 2015-02-21 NOTE — Patient Instructions (Signed)
I'm glad you are doing better  If you want home physical therapy please call and let me know  Get your chest CT as planned and follow up for labs for the lasix (which I think will help symptoms more and more) Vitals are stable  Keep me updated if symptoms worsen or any other concerns

## 2015-02-21 NOTE — Assessment & Plan Note (Signed)
Reviewed pulm note  No clinical signs of pneumonia - agreed  For CT of chest tomorrow  Improving on lasix

## 2015-02-21 NOTE — Telephone Encounter (Signed)
Left message to call back  

## 2015-02-21 NOTE — Progress Notes (Signed)
Pre visit review using our clinic review tool, if applicable. No additional management support is needed unless otherwise documented below in the visit note. 

## 2015-02-21 NOTE — Assessment & Plan Note (Signed)
Resolved after stopping azithro Clinically imp  Rev cardiol notes

## 2015-02-21 NOTE — Telephone Encounter (Signed)
-----   Message from Burtis Junes, NP sent at 02/21/2015  1:04 PM EDT ----- Please call daughter or patient.  She should be on her Lasix - Dr. Jacquelynn Cree wants to keep her on 40 mg daily for now (not cutting back as I had said to earlier this week).  Keep lab appointment for next week.  lori

## 2015-02-21 NOTE — Assessment & Plan Note (Signed)
Had tachycardia in hospital and then bradycardia post hosp  Doing well with eliquis  Metoprolol was cut 1/2 by cardiology and pt feels much better

## 2015-02-21 NOTE — Assessment & Plan Note (Signed)
Oxygen blended into CPAP at night I doubt whether there was a pneumonia at all during the past admission

## 2015-02-21 NOTE — Progress Notes (Signed)
Subjective:    Patient ID: [name], female    DOB: 10/17/1934, 81 y.o.   MRN: [id number]  HPI Here for f/u of hosp 4/16-4/21  Pt had exac of PSVT after recent illness along with prolonged QT thought to be from azithromycin  Also ? Pneumonia (now susp not by pulm)-tx with vanc/aztremnamm and then oral ceftin  Neg cx incl blood cx  Had lexiscan /myoview EF 57 % Put on eliquis for poss sick sinus syndrome as well    F/u with cardiology 4/25- bradycardic/ dec her metoprolol  F/u with pulmonary 4/26 Suspect her pulm htn/ cor pulm was worse -ordered CT (heard crackles) also to r/o any pulm fibrosis Inc lasix to 40  Enc continued use of cpap CT ordered   Today  BP: 112/64 mmHg  Pulse 53  Wt not taken   Dg Chest Port 1 View  02/10/2015   CLINICAL DATA:  Acute onset of weakness. History of right ventricular failure and hypertension.  EXAM: PORTABLE CHEST - 1 VIEW  COMPARISON:  05/30/2014  FINDINGS: The heart is mildly enlarged. There is tortuosity and mild ectasia of the thoracic aorta. There are chronic emphysematous and bronchitic lung changes but no acute overlying pulmonary process. No pleural effusion. The bony thorax is intact.  IMPRESSION: Mild cardiac enlargement.  Chronic lung changes but no acute overlying pulmonary process.   Electronically Signed   By: Marijo Sanes M.D.   On: 02/10/2015 13:25   Results for orders placed or performed in visit on 02/19/15  Brain natriuretic peptide  Result Value Ref Range   Pro B Natriuretic peptide (BNP) 1306.0 (H) 0.0 - 100.0 pg/mL  CBC  Result Value Ref Range   WBC 6.8 4.0 - 10.5 K/uL   RBC 4.26 3.87 - 5.11 Mil/uL   Platelets 186.0 150.0 - 400.0 K/uL   Hemoglobin 12.7 12.0 - 15.0 g/dL   HCT 39.0 36.0 - 46.0 %   MCV 91.6 78.0 - 100.0 fl   MCHC 32.5 30.0 - 36.0 g/dL   RDW 16.1 (H) 11.5 - 84.1 %  Basic metabolic panel  Result Value Ref Range   Sodium 135 135 - 145 mEq/L   Potassium 4.5 3.5 - 5.1 mEq/L   Chloride 101 96 - 112  mEq/L   CO2 33 (H) 19 - 32 mEq/L   Glucose, Bld 109 (H) 70 - 99 mg/dL   BUN 33 (H) 6 - 23 mg/dL   Creatinine, Ser 0.98 0.40 - 1.20 mg/dL   Calcium 9.6 8.4 - 10.5 mg/dL   GFR 58.14 (L) >60.00 mL/min     She is urinating more with increased lasix  ? If it helped her breathing  Legs look the same -has only had 2 doses  Not dizzy  Is generally weak but not as much as she was  Has not started PT at this point  [facility] with walker  Has a set of weights for arm exercises   Had hallucinations one night without cpap  Now back on cpap   Mental status is getting better  Has a lot of care from family   Patient Active Problem List   Diagnosis Date Noted  . RBBB-new 02/12/2015  . Prolonged Q-T interval on ECG- (on Azithromycin)  02/12/2015  . History of bilat PE after TKR (off Coumadin Dec 2015) 02/12/2015  . Abnormal EKG-TWI, RBBB, prolonged QT   . Elevated troponin   . Blood poisoning   . Weak   . Cough 01/23/2015  .  Hernia, umbilical 62/83/1517  . Hx of colonic polyps 10/24/2014  . Heme + stool   . Diverticulosis of colon without hemorrhage   . UTI (urinary tract infection) 10/13/2014  . Occult blood positive stool 07/30/2014  . Dyspnea 07/26/2014  . OSA (obstructive sleep apnea) 07/22/2014  . Unspecified arthropathy, lower leg 03/21/2014  . History of right knee joint replacement 03/15/2014  . Preop cardiovascular exam 02/23/2014  . Solitary pulmonary nodule 05/03/2013  . Acute cor pulmonale 04/26/2013  . Sternal fracture 04/26/2013  . Encounter for Medicare annual wellness exam 01/26/2013  . Right ventricular failure 12/09/2012  . PE (pulmonary embolism) 11/24/2012  . DVT (deep venous thrombosis) 11/24/2012  . Pulmonary hypertension, moderate to severe 11/24/2012  . PES PLANUS 08/15/2010  . URINARY FREQUENCY, CHRONIC 07/14/2008  . Dyslipidemia 07/09/2007  . Essential hypertension 07/09/2007  . PSVT with SSS 02/23/2007  . SCLERODERMA 02/23/2007  . DEGENERATIVE JOINT  DISEASE 02/23/2007  . SPINAL STENOSIS, LUMBAR 02/23/2007  . OSTEOPENIA 02/23/2007  . SPONDYLOLISTHESIS 02/23/2007   Past Medical History  Diagnosis Date  . Osteopenia   . Hyperlipidemia   . Hypertension   . PSVT (paroxysmal supraventricular tachycardia)   . Spinal stenosis   . Overactive bladder     urge incontinence  . Pulmonary emboli   . Pulmonary hypertension   . Right ventricular failure   . Arthritis     knee  . Anemia   . Diverticulosis   . Sleep apnea     CPAP  . Hx of colonic polyps 10/24/2014   Past Surgical History  Procedure Laterality Date  . Cataract extraction Bilateral 2008  . Bladder suspension  1993  . Total knee arthroplasty Left 2000  . Total knee revision  01/12/2012    Procedure: TOTAL KNEE REVISION;  Surgeon: Kerin Salen, MD;  Location: [place];  Service: Orthopedics;  Laterality: Left;  DEPUY/ LCS , HAND SET  . Trigger finger release  01/12/2012    Procedure: RELEASE TRIGGER FINGER/A-1 PULLEY;  Surgeon: Kerin Salen, MD;  Location: [place];  Service: Orthopedics;  Laterality: Right;  . Cholecystectomy    . Incisional hernia repair    . Total knee arthroplasty Left 10/29/2012    at [facility]  . Total knee arthroplasty Right 03/09/2014  . Colonoscopy    . Right heart catheterization N/A 07/04/2014    Procedure: RIGHT HEART CATH;  Surgeon: Jolaine Artist, MD;  Location: [facility] CATH LAB;  Service: Cardiovascular;  Laterality: N/A;  . Tonsillectomy and adenoidectomy    . Colonoscopy N/A 10/24/2014    Procedure: COLONOSCOPY;  Surgeon: Gatha Mayer, MD;  Location: [facility];  Service: Endoscopy;  Laterality: N/A;   History  Substance Use Topics  . Smoking status: Never Smoker   . Smokeless tobacco: Never Used  . Alcohol Use: No   Family History  Problem Relation Age of Onset  . Breast cancer Mother   . Heart disease Father   . Diabetes Father   . Hypertension Father   . Uterine cancer Sister    Allergies  Allergen Reactions  . Penicillins Hives    . Sulfonamide Derivatives Hives  . Zithromax [Azithromycin] Other (See Comments)    Prolonged QT on EKG  . Adhesive [Tape] Other (See Comments)    redness  . Prednisone Other (See Comments)    Causes insomnia   Current Outpatient Prescriptions on File Prior to Visit  Medication Sig Dispense Refill  . apixaban (ELIQUIS) 5 MG TABS tablet Take  1 tablet (5 mg total) by mouth 2 (two) times daily. 60 tablet 0  . benzonatate (TESSALON) 200 MG capsule Take 1 capsule (200 mg total) by mouth 2 (two) times daily as needed for cough. 20 capsule 0  . Calcium Carb-Cholecalciferol (CALCIUM 600/VITAMIN D3) 600-800 MG-UNIT TABS Take 1 tablet by mouth 2 (two) times daily.    . cefUROXime (CEFTIN) 250 MG tablet Take 1 tablet (250 mg total) by mouth 2 (two) times daily with a meal. 14 tablet 0  . diphenhydrAMINE (BENADRYL) 25 mg capsule Take 25 mg by mouth every morning.     . furosemide (LASIX) 20 MG tablet Take 2 tablets (40 mg total) by mouth for 3 days (02/20/2015 to 02/22/2015), then back to 1 tablet (20 mg total) by mouth daily. 90 tablet 3  . metoprolol (LOPRESSOR) 50 MG tablet Take 0.5 tablets (25 mg total) by mouth 2 (two) times daily. 60 tablet 0  . Multiple Vitamin (MULTIVITAMIN WITH MINERALS) TABS tablet Take 1 tablet by mouth daily.    . ondansetron (ZOFRAN) 4 MG tablet Take 1 tablet (4 mg total) by mouth every 8 (eight) hours as needed for nausea or vomiting. 20 tablet 0  . OXYGEN Inhale 2 L/min into the lungs at bedtime.    . polyethylene glycol (MIRALAX / GLYCOLAX) packet Take 8.5 g by mouth daily.     . potassium chloride (K-DUR) 10 MEQ tablet Take 1 tablet (10 mEq total) by mouth daily. 90 tablet 3  . vitamin C (ASCORBIC ACID) 500 MG tablet Take 500 mg by mouth daily.     No current facility-administered medications on file prior to visit.    Review of Systems Review of Systems  Constitutional: Negative for fever, appetite change,  and unexpected weight change. pos for fatigue and  generalized weakness  Eyes: Negative for pain and visual disturbance.  Respiratory: Negative for cough and shortness of breath.   Cardiovascular: Negative for cp or palpitations    Gastrointestinal: Negative for nausea, diarrhea and constipation.  Genitourinary: Negative for urgency and frequency.  Skin: Negative for pallor or rash   Neurological: Negative for weakness, light-headedness, numbness and headaches.  Hematological: Negative for adenopathy. Does not bruise/bleed easily.  Psychiatric/Behavioral: Negative for dysphoric mood. The patient is not nervous/anxious.         Objective:   Physical Exam  Constitutional: She appears well-developed and well-nourished. No distress.  Frail appearing elderly female in wheelchair with 02 by nasal cannula   HENT:  Head: Normocephalic and atraumatic.  Mouth/Throat: Oropharynx is clear and moist.  Eyes: Conjunctivae and EOM are normal. Pupils are equal, round, and reactive to light.  Neck: Normal range of motion. Neck supple. No JVD present. Carotid bruit is not present. No thyromegaly present.  Cardiovascular: Normal rate, normal heart sounds and intact distal pulses.  Exam reveals no gallop.   Pulmonary/Chest: Effort normal and breath sounds normal. No respiratory distress. She has no wheezes. She has no rales.  Scant crackles in bilateral bases   Abdominal: Soft. Bowel sounds are normal. She exhibits no distension, no abdominal bruit and no mass. There is no tenderness.  Musculoskeletal: She exhibits edema.  Trace to one plus pedal edema, pitting   Lymphadenopathy:    She has no cervical adenopathy.  Neurological: She is alert. She has normal reflexes.  No focal weakness- pt is generally weak and able to stand with assistance   Skin: Skin is warm and dry. No rash noted.  Psychiatric: She has a  normal mood and affect.          Assessment & Plan:   Problem List Items Addressed This Visit      Cardiovascular and Mediastinum    Acute cor pulmonale    Reviewed pulm note  No clinical signs of pneumonia - agreed  For CT of chest tomorrow  Improving on lasix        Prolonged Q-T interval on ECG- (on Azithromycin)  - Primary    Resolved after stopping azithro Clinically imp  Rev cardiol notes       PSVT with SSS    Had tachycardia in hospital and then bradycardia post hosp  Doing well with eliquis  Metoprolol was cut 1/2 by cardiology and pt feels much better          Other   Weak    Improving after hosp  Declines home PT for now but will update if she thinks she needs it in the coming weeks

## 2015-02-21 NOTE — Assessment & Plan Note (Signed)
Improving after hosp  Declines home PT for now but will update if she thinks she needs it in the coming weeks

## 2015-02-22 ENCOUNTER — Telehealth: Payer: Self-pay | Admitting: Internal Medicine

## 2015-02-22 ENCOUNTER — Ambulatory Visit (INDEPENDENT_AMBULATORY_CARE_PROVIDER_SITE_OTHER)
Admission: RE | Admit: 2015-02-22 | Discharge: 2015-02-22 | Disposition: A | Discharge status: Home / Self Care | Payer: Medicare Other | Source: Ambulatory Visit | Attending: Pulmonary Disease | Admitting: Pulmonary Disease

## 2015-02-22 DIAGNOSIS — J849 Interstitial pulmonary disease, unspecified: Secondary | ICD-10-CM | POA: Diagnosis not present

## 2015-02-22 NOTE — Telephone Encounter (Signed)
Spoke with daughter and she must have played her voicemail twice as Theda Sers had already gone over labs with her

## 2015-02-22 NOTE — Telephone Encounter (Signed)
New problem    Pt's daughter returning a call to nurse concerning labs. Please call pt's daughter

## 2015-02-23 ENCOUNTER — Other Ambulatory Visit: Payer: Self-pay | Admitting: Internal Medicine

## 2015-02-24 ENCOUNTER — Other Ambulatory Visit: Payer: Self-pay | Admitting: Internal Medicine

## 2015-02-25 ENCOUNTER — Encounter (HOSPITAL_COMMUNITY): Payer: Self-pay | Admitting: Emergency Medicine

## 2015-02-25 ENCOUNTER — Other Ambulatory Visit (HOSPITAL_COMMUNITY): Payer: Self-pay

## 2015-02-25 ENCOUNTER — Other Ambulatory Visit: Payer: Self-pay

## 2015-02-25 ENCOUNTER — Observation Stay (HOSPITAL_COMMUNITY)
Admission: EM | Admit: 2015-02-25 | Discharge: 2015-03-03 | Disposition: A | Discharge status: Skilled Nursing Facility | Payer: Medicare Other | Attending: Internal Medicine | Admitting: Internal Medicine

## 2015-02-25 ENCOUNTER — Emergency Department (HOSPITAL_COMMUNITY): Payer: Medicare Other

## 2015-02-25 DIAGNOSIS — I272 Other secondary pulmonary hypertension: Secondary | ICD-10-CM | POA: Insufficient documentation

## 2015-02-25 DIAGNOSIS — I270 Primary pulmonary hypertension: Secondary | ICD-10-CM | POA: Diagnosis not present

## 2015-02-25 DIAGNOSIS — F0390 Unspecified dementia without behavioral disturbance: Secondary | ICD-10-CM | POA: Diagnosis not present

## 2015-02-25 DIAGNOSIS — I10 Essential (primary) hypertension: Secondary | ICD-10-CM | POA: Diagnosis not present

## 2015-02-25 DIAGNOSIS — R079 Chest pain, unspecified: Secondary | ICD-10-CM | POA: Diagnosis present

## 2015-02-25 DIAGNOSIS — I2609 Other pulmonary embolism with acute cor pulmonale: Secondary | ICD-10-CM | POA: Diagnosis present

## 2015-02-25 DIAGNOSIS — J9621 Acute and chronic respiratory failure with hypoxia: Secondary | ICD-10-CM | POA: Diagnosis present

## 2015-02-25 DIAGNOSIS — I509 Heart failure, unspecified: Secondary | ICD-10-CM | POA: Diagnosis not present

## 2015-02-25 DIAGNOSIS — Z66 Do not resuscitate: Secondary | ICD-10-CM | POA: Insufficient documentation

## 2015-02-25 DIAGNOSIS — R5381 Other malaise: Secondary | ICD-10-CM | POA: Diagnosis present

## 2015-02-25 DIAGNOSIS — Z96652 Presence of left artificial knee joint: Secondary | ICD-10-CM | POA: Diagnosis not present

## 2015-02-25 DIAGNOSIS — I471 Supraventricular tachycardia, unspecified: Secondary | ICD-10-CM | POA: Diagnosis present

## 2015-02-25 DIAGNOSIS — M48061 Spinal stenosis, lumbar region without neurogenic claudication: Secondary | ICD-10-CM | POA: Diagnosis present

## 2015-02-25 DIAGNOSIS — Z86711 Personal history of pulmonary embolism: Secondary | ICD-10-CM | POA: Diagnosis present

## 2015-02-25 DIAGNOSIS — G4733 Obstructive sleep apnea (adult) (pediatric): Secondary | ICD-10-CM | POA: Diagnosis not present

## 2015-02-25 DIAGNOSIS — I4891 Unspecified atrial fibrillation: Secondary | ICD-10-CM | POA: Diagnosis not present

## 2015-02-25 DIAGNOSIS — J9601 Acute respiratory failure with hypoxia: Secondary | ICD-10-CM

## 2015-02-25 DIAGNOSIS — R0789 Other chest pain: Secondary | ICD-10-CM

## 2015-02-25 DIAGNOSIS — R072 Precordial pain: Secondary | ICD-10-CM | POA: Diagnosis not present

## 2015-02-25 DIAGNOSIS — E785 Hyperlipidemia, unspecified: Secondary | ICD-10-CM | POA: Diagnosis not present

## 2015-02-25 DIAGNOSIS — I2781 Cor pulmonale (chronic): Secondary | ICD-10-CM

## 2015-02-25 DIAGNOSIS — I50811 Acute right heart failure: Secondary | ICD-10-CM

## 2015-02-25 LAB — BASIC METABOLIC PANEL
Anion gap: 8 (ref 5–15)
BUN: 19 mg/dL (ref 6–20)
CHLORIDE: 94 mmol/L — AB (ref 101–111)
CO2: 35 mmol/L — ABNORMAL HIGH (ref 22–32)
Calcium: 9.3 mg/dL (ref 8.9–10.3)
Creatinine, Ser: 1.12 mg/dL — ABNORMAL HIGH (ref 0.44–1.00)
GFR calc non Af Amer: 45 mL/min — ABNORMAL LOW (ref >60)
GFR, EST AFRICAN AMERICAN: 53 mL/min — AB (ref >60)
Glucose, Bld: 112 mg/dL — ABNORMAL HIGH (ref 70–99)
POTASSIUM: 4.1 mmol/L (ref 3.5–5.1)
Sodium: 137 mmol/L (ref 135–145)

## 2015-02-25 LAB — CBC WITH DIFFERENTIAL/PLATELET
Basophils Absolute: 0 10*3/uL (ref 0.0–0.1)
Basophils Relative: 0 % (ref 0–1)
Eosinophils Absolute: 0.2 10*3/uL (ref 0.0–0.7)
Eosinophils Relative: 3 % (ref 0–5)
HCT: 42.9 % (ref 36.0–46.0)
HEMOGLOBIN: 12.7 g/dL (ref 12.0–15.0)
LYMPHS ABS: 1 10*3/uL (ref 0.7–4.0)
LYMPHS PCT: 16 % (ref 12–46)
MCH: 29.3 pg (ref 26.0–34.0)
MCHC: 29.6 g/dL — ABNORMAL LOW (ref 30.0–36.0)
MCV: 98.8 fL (ref 78.0–100.0)
MONOS PCT: 12 % (ref 3–12)
Monocytes Absolute: 0.7 10*3/uL (ref 0.1–1.0)
NEUTROS PCT: 69 % (ref 43–77)
Neutro Abs: 4.3 10*3/uL (ref 1.7–7.7)
Platelets: 153 10*3/uL (ref 150–400)
RBC: 4.34 MIL/uL (ref 3.87–5.11)
RDW: 16.8 % — ABNORMAL HIGH (ref 11.5–15.5)
WBC: 6.2 10*3/uL (ref 4.0–10.5)

## 2015-02-25 LAB — I-STAT TROPONIN, ED: Troponin i, poc: 0.02 ng/mL (ref 0.00–0.08)

## 2015-02-25 LAB — PROTIME-INR
INR: 1.65 — AB (ref 0.00–1.49)
Prothrombin Time: 19.7 seconds — ABNORMAL HIGH (ref 11.6–15.2)

## 2015-02-25 LAB — TROPONIN I
Troponin I: 0.04 ng/mL — ABNORMAL HIGH (ref <0.031)
Troponin I: 0.03 ng/mL (ref <0.031)
Troponin I: 0.04 ng/mL — ABNORMAL HIGH (ref <0.031)

## 2015-02-25 MED ORDER — ADULT MULTIVITAMIN W/MINERALS CH
1.0000 | ORAL_TABLET | Freq: Every day | ORAL | Status: DC
  Administered 2015-02-25 – 2015-03-03 (×7): 1 via ORAL
  Filled 2015-02-25 (×7): qty 1

## 2015-02-25 MED ORDER — BOOST / RESOURCE BREEZE PO LIQD
1.0000 | Freq: Three times a day (TID) | ORAL | Status: DC
  Administered 2015-02-25 – 2015-03-03 (×14): 1 via ORAL
  Filled 2015-02-25: qty 1

## 2015-02-25 MED ORDER — MORPHINE SULFATE 2 MG/ML IJ SOLN
2.0000 mg | INTRAMUSCULAR | Status: DC | PRN
  Administered 2015-02-27 – 2015-03-03 (×8): 2 mg via INTRAVENOUS
  Filled 2015-02-25 (×8): qty 1

## 2015-02-25 MED ORDER — POTASSIUM CHLORIDE CRYS ER 20 MEQ PO TBCR
40.0000 meq | EXTENDED_RELEASE_TABLET | Freq: Every day | ORAL | Status: DC
  Administered 2015-02-25 – 2015-03-03 (×7): 40 meq via ORAL
  Filled 2015-02-25 (×7): qty 2

## 2015-02-25 MED ORDER — POLYETHYLENE GLYCOL 3350 17 G PO PACK
8.5000 g | PACK | Freq: Every day | ORAL | Status: DC
  Administered 2015-02-25 – 2015-03-03 (×7): 8.5 g via ORAL
  Filled 2015-02-25 (×7): qty 1

## 2015-02-25 MED ORDER — FUROSEMIDE 10 MG/ML IJ SOLN
40.0000 mg | Freq: Every day | INTRAMUSCULAR | Status: DC
  Administered 2015-02-25: 40 mg via INTRAVENOUS
  Filled 2015-02-25 (×2): qty 4

## 2015-02-25 MED ORDER — ONDANSETRON HCL 4 MG PO TABS
4.0000 mg | ORAL_TABLET | Freq: Three times a day (TID) | ORAL | Status: DC | PRN
  Administered 2015-02-25: 4 mg via ORAL
  Filled 2015-02-25: qty 1

## 2015-02-25 MED ORDER — ACETAMINOPHEN 325 MG PO TABS
650.0000 mg | ORAL_TABLET | ORAL | Status: DC | PRN
  Administered 2015-02-26 – 2015-02-27 (×2): 650 mg via ORAL
  Filled 2015-02-25 (×2): qty 2

## 2015-02-25 MED ORDER — ONDANSETRON HCL 4 MG/2ML IJ SOLN
4.0000 mg | Freq: Four times a day (QID) | INTRAMUSCULAR | Status: DC | PRN
  Administered 2015-02-26: 4 mg via INTRAVENOUS
  Filled 2015-02-25: qty 2

## 2015-02-25 MED ORDER — ZOLPIDEM TARTRATE 5 MG PO TABS
5.0000 mg | ORAL_TABLET | Freq: Once | ORAL | Status: AC
  Administered 2015-02-25: 5 mg via ORAL
  Filled 2015-02-25: qty 1

## 2015-02-25 MED ORDER — GI COCKTAIL ~~LOC~~
30.0000 mL | Freq: Four times a day (QID) | ORAL | Status: DC | PRN
  Filled 2015-02-25: qty 30

## 2015-02-25 MED ORDER — APIXABAN 5 MG PO TABS
5.0000 mg | ORAL_TABLET | Freq: Two times a day (BID) | ORAL | Status: DC
  Administered 2015-02-25 (×2): 5 mg via ORAL
  Filled 2015-02-25 (×4): qty 1

## 2015-02-25 MED ORDER — METOPROLOL TARTRATE 25 MG PO TABS
25.0000 mg | ORAL_TABLET | Freq: Two times a day (BID) | ORAL | Status: DC
  Administered 2015-02-25 – 2015-03-03 (×11): 25 mg via ORAL
  Filled 2015-02-25 (×15): qty 1

## 2015-02-25 NOTE — Consult Note (Signed)
Reason for Consult: Chest pain  Requesting Physician: [place] Pink  Cardiologist: Allred  HPI: This is a 81 y.o. female with a past medical history significant for recurrent paroxysmal atrial tachycardia and pulmonary hypertension of probably multifactorial etiology (obesity hypoventilation syndrome, obstructive sleep apnea, previous thromboembolic disease) readmitted with complaints of chest pain and dyspnea, now improved. The chest pain is sharp and very brief and not associated with activity. It occurs randomly. Her shortness of breath is constant. She has very limited mobility. She has extremely frequent bursts of paroxysmal atrial tachycardia all day, roughly 120 bpm, similar to her previous admission. In between the episodes of tachycardia she has sinus rhythm at 60 bpm.  She was admitted roughly 2 weeks ago with similar complaints and at that time had an electrocardiogram with severe QT interval prolongation and prominent anterior T-wave inversion, raising the concern for ischemic heart disease versus a drug effects. She underwent a nuclear myocardial perfusion study with a low risk findings. Echo showed normal LV function and moderate pulmonary hypertension with systolic PA pressure estimated at 57 mmHg with a severely dilated right ventricle. The QT interval improved. She has been treated both with antibiotics for pulmonary infection and with diuretics for presumed heart failure.  Since then she has seen Dr. Elsworth Soho and had a high-resolution CT of the chest that did not demonstrate clear evidence of interstitial pulmonary disease or congestive heart failure and confirmed the presence of coronary artery calcification. A small area of consultation with air bronchograms is seen.  She also saw Truitt Merle in the cardiology clinic after she had persistent fatigue even following reduction in the dose of beta blocker. At home she heart rate in the 40s before reducing her dose of beta blocker  to 25 mg twice a day, but when seen by Cecille Rubin, rate was 60, similar to today  The patient finds it difficult to tell me exactly what's bothering her. It seems to be a combination of intermittent sharp chest pain, persistent dyspnea, but most of all generalized weakness and fatigue  PMHx:  Past Medical History  Diagnosis Date  . Osteopenia   . Hyperlipidemia   . Hypertension   . PSVT (paroxysmal supraventricular tachycardia)   . Spinal stenosis   . Overactive bladder     urge incontinence  . Pulmonary emboli   . Pulmonary hypertension   . Right ventricular failure   . Arthritis     knee  . Anemia   . Diverticulosis   . Sleep apnea     CPAP  . Hx of colonic polyps 10/24/2014   Past Surgical History  Procedure Laterality Date  . Cataract extraction Bilateral 2008  . Bladder suspension  1993  . Total knee arthroplasty Left 2000  . Total knee revision  01/12/2012    Procedure: TOTAL KNEE REVISION;  Surgeon: Kerin Salen, MD;  Location: [place];  Service: Orthopedics;  Laterality: Left;  DEPUY/ LCS , HAND SET  . Trigger finger release  01/12/2012    Procedure: RELEASE TRIGGER FINGER/A-1 PULLEY;  Surgeon: Kerin Salen, MD;  Location: [place];  Service: Orthopedics;  Laterality: Right;  . Cholecystectomy    . Incisional hernia repair    . Total knee arthroplasty Left 10/29/2012    at [facility]  . Total knee arthroplasty Right 03/09/2014  . Colonoscopy    . Right heart catheterization N/A 07/04/2014    Procedure: RIGHT HEART CATH;  Surgeon: Jolaine Artist, MD;  Location: [facility] CATH  LAB;  Service: Cardiovascular;  Laterality: N/A;  . Tonsillectomy and adenoidectomy    . Colonoscopy N/A 10/24/2014    Procedure: COLONOSCOPY;  Surgeon: Gatha Mayer, MD;  Location: [facility];  Service: Endoscopy;  Laterality: N/A;    FAMHx: Family History  Problem Relation Age of Onset  . Breast cancer Mother   . Heart disease Father   . Diabetes Father   . Hypertension Father   . Uterine cancer Sister      SOCHx:  reports that she has never smoked. She has never used smokeless tobacco. She reports that she does not drink alcohol or use illicit drugs.  ALLERGIES: Allergies  Allergen Reactions  . Penicillins Hives  . Sulfonamide Derivatives Hives  . Zithromax [Azithromycin] Other (See Comments)    Prolonged QT on EKG  . Adhesive [Tape] Other (See Comments)    redness  . Prednisone Other (See Comments)    Causes insomnia    ROS: Pertinent items are noted in HPI.  HOME MEDICATIONS: Prescriptions prior to admission  Medication Sig Dispense Refill Last Dose  . apixaban (ELIQUIS) 5 MG TABS tablet Take 1 tablet (5 mg total) by mouth 2 (two) times daily. 60 tablet 0 02/24/2015 at 0800  . benzonatate (TESSALON) 200 MG capsule Take 1 capsule (200 mg total) by mouth 2 (two) times daily as needed for cough. 20 capsule 0 02/24/2015 at Unknown time  . Calcium Carb-Cholecalciferol (CALCIUM 600/VITAMIN D3) 600-800 MG-UNIT TABS Take 1 tablet by mouth 2 (two) times daily.   02/24/2015 at Unknown time  . cefUROXime (CEFTIN) 250 MG tablet Take 1 tablet (250 mg total) by mouth 2 (two) times daily with a meal. 14 tablet 0 02/24/2015 at Unknown time  . diphenhydrAMINE (BENADRYL) 25 mg capsule Take 25 mg by mouth every morning.    02/24/2015 at Unknown time  . furosemide (LASIX) 20 MG tablet Take 2 tablets (40 mg total) by mouth for 3 days (02/20/2015 to 02/22/2015), then back to 1 tablet (20 mg total) by mouth daily. 90 tablet 3 02/24/2015 at Unknown time  . metoprolol (LOPRESSOR) 50 MG tablet Take 0.5 tablets (25 mg total) by mouth 2 (two) times daily. 60 tablet 0 02/24/2015 at 1900  . Multiple Vitamin (MULTIVITAMIN WITH MINERALS) TABS tablet Take 1 tablet by mouth daily.   02/24/2015 at Unknown time  . ondansetron (ZOFRAN) 4 MG tablet Take 1 tablet (4 mg total) by mouth every 8 (eight) hours as needed for nausea or vomiting. 20 tablet 0 02/24/2015 at Unknown time  . OXYGEN Inhale 2 L/min into the lungs continuous.     02/24/2015 at Unknown time  . polyethylene glycol (MIRALAX / GLYCOLAX) packet Take 8.5 g by mouth daily.    02/24/2015 at Unknown time  . potassium chloride (K-DUR) 10 MEQ tablet Take 1 tablet (10 mEq total) by mouth daily. 90 tablet 3 02/24/2015 at Unknown time  . vitamin C (ASCORBIC ACID) 500 MG tablet Take 500 mg by mouth daily.   02/24/2015 at Unknown time    HOSPITAL MEDICATIONS: I have reviewed the patient's current medications. Prior to Admission:  Prescriptions prior to admission  Medication Sig Dispense Refill Last Dose  . apixaban (ELIQUIS) 5 MG TABS tablet Take 1 tablet (5 mg total) by mouth 2 (two) times daily. 60 tablet 0 02/24/2015 at 0800  . benzonatate (TESSALON) 200 MG capsule Take 1 capsule (200 mg total) by mouth 2 (two) times daily as needed for cough. 20 capsule 0 02/24/2015 at Unknown time  .  Calcium Carb-Cholecalciferol (CALCIUM 600/VITAMIN D3) 600-800 MG-UNIT TABS Take 1 tablet by mouth 2 (two) times daily.   02/24/2015 at Unknown time  . cefUROXime (CEFTIN) 250 MG tablet Take 1 tablet (250 mg total) by mouth 2 (two) times daily with a meal. 14 tablet 0 02/24/2015 at Unknown time  . diphenhydrAMINE (BENADRYL) 25 mg capsule Take 25 mg by mouth every morning.    02/24/2015 at Unknown time  . furosemide (LASIX) 20 MG tablet Take 2 tablets (40 mg total) by mouth for 3 days (02/20/2015 to 02/22/2015), then back to 1 tablet (20 mg total) by mouth daily. 90 tablet 3 02/24/2015 at Unknown time  . metoprolol (LOPRESSOR) 50 MG tablet Take 0.5 tablets (25 mg total) by mouth 2 (two) times daily. 60 tablet 0 02/24/2015 at 1900  . Multiple Vitamin (MULTIVITAMIN WITH MINERALS) TABS tablet Take 1 tablet by mouth daily.   02/24/2015 at Unknown time  . ondansetron (ZOFRAN) 4 MG tablet Take 1 tablet (4 mg total) by mouth every 8 (eight) hours as needed for nausea or vomiting. 20 tablet 0 02/24/2015 at Unknown time  . OXYGEN Inhale 2 L/min into the lungs continuous.    02/24/2015 at Unknown time  .  polyethylene glycol (MIRALAX / GLYCOLAX) packet Take 8.5 g by mouth daily.    02/24/2015 at Unknown time  . potassium chloride (K-DUR) 10 MEQ tablet Take 1 tablet (10 mEq total) by mouth daily. 90 tablet 3 02/24/2015 at Unknown time  . vitamin C (ASCORBIC ACID) 500 MG tablet Take 500 mg by mouth daily.   02/24/2015 at Unknown time    VITALS: Blood pressure 101/76, pulse 123, temperature 98 F (36.7 C), temperature source Oral, resp. rate 23, height 5\' 6"  (1.676 m), weight 220 lb 10.9 oz (100.1 kg), SpO2 94 %.  PHYSICAL EXAM: General: Alert, oriented x3, no distress Head: no evidence of trauma, PERRL, EOMI, no exophtalmos or lid lag, no myxedema, no xanthelasma; normal ears, nose and oropharynx Neck: Hard to evaluate, probably normal jugular venous pulsations and no hepatojugular reflux; brisk carotid pulses without delay and no carotid bruits Chest: clear to auscultation, no signs of consolidation by percussion or palpation, normal fremitus, symmetrical and full respiratory excursions Cardiovascular: normal position and quality of the apical impulse, regular rhythm, normal first heart sound and widely split second heart sound, no rubs or gallops, no murmur Abdomen: no tenderness or distention, no masses by palpation, no abnormal pulsatility or arterial bruits, normal bowel sounds, no hepatosplenomegaly Extremities: no clubbing, cyanosis; no edema; 2+ radial, ulnar and brachial pulses bilaterally; 2+ right femoral, posterior tibial and dorsalis pedis pulses; 2+ left femoral, posterior tibial and dorsalis pedis pulses; no subclavian or femoral bruits Neurological: grossly nonfocal   LABS  CBC  Recent Labs  02/25/15 0559  WBC 6.2  NEUTROABS 4.3  HGB 12.7  HCT 42.9  MCV 98.8  PLT 818   Basic Metabolic Panel  Recent Labs  02/25/15 0559  NA 137  K 4.1  CL 94*  CO2 35*  GLUCOSE 112*  BUN 19  CREATININE 1.12*  CALCIUM 9.3   Liver Function Tests No results for input(s): AST,  ALT, ALKPHOS, BILITOT, PROT, ALBUMIN in the last 72 hours. No results for input(s): LIPASE, AMYLASE in the last 72 hours. Cardiac Enzymes  Recent Labs  02/25/15 0827  TROPONINI 0.04*     IMAGING: Dg Chest 2 View  02/25/2015   CLINICAL DATA:  Chest pain and shortness of breath.  EXAM: CHEST  2  VIEW  COMPARISON:  Chest radiograph February 10, 2015 and CT of the chest February 22, 2015  FINDINGS: The cardiac silhouette is moderately enlarged, unchanged. Tortuous aorta. Small to moderate RIGHT pleural effusion was present previously. Similar bronchitic changes. Minimal biapical pleural thickening. No pneumothorax. Soft tissue planes and included osseous structure nonsuspicious.  IMPRESSION: Stable cardiomegaly. Similar bronchitic changes with small to moderate RIGHT pleural effusion. Recommend followup chest radiograph after treatment to verify improvement.   Electronically Signed   By: Elon Alas   On: 02/25/2015 06:51    ECG: Poor quality tracing due to baseline artifact, supraventricular tachycardia that may represent either atrial flutter with 2-1 AV block or ectopic atrial tachycardia  TELEMETRY:  multiple episodes of supraventricular tachycardia off and on throughout the afternoon, all with roughly the same heart rate of 125 bpm and abrupt onset and abrupt cessation, most likely ectopic atrial tachycardia  IMPRESSION: 1. Pulmonary artery hypertension, at least moderate. She is on anticoagulation therapy. 2. Incessant episodes of ectopic atrial tachycardia, probably related to right heart overload. Poorly tolerant to higher doses of beta blocker; consider alternative antiarrhythmic versus ablation therapy 3. Atypical sharp chest pain with normal recent nuclear myocardial perfusion study and normal left ventricular systolic function 4. Coronary artery calcification on CT chest 5. History of bilateral pulmonary embolism, obstructive sleep apnea versus obesity hypoventilation  syndrome  RECOMMENDATION: 1. I think it is worthwhile reevaluating the severity of her pulmonary hypertension, even if this involves a repeat right heart catheterization. Consider right heart cath with exercise as proposed by Dr. Haroldine Laws last December I don't think she's ever had coronary angiography, but not withstanding the normal nuclear perfusion study this might also be worthwhile since the issue of "chest pain" keeps leading to hospitalization 2. It is also unclear to what degree her episodes of arrhythmia are causing symptoms. I think she should have a repeat evaluation by Dr. Rayann Heman. Clearly she is not tolerant of beta blockers  Time Spent Directly with Patient: 60 minutes  Sanda Klein, MD, [facility] [phone] office [phone] pager   02/25/2015, 3:34 PM

## 2015-02-25 NOTE — Progress Notes (Signed)
[employer] medicare unlikely to approve an inpt rehab admission for pt's current diagnosis. PT and OT are pending. [phone]

## 2015-02-25 NOTE — H&P (Signed)
[facility] History and Physical                                                                                    [name], is a 81 y.o. female  MRN: [id number]   DOB - 12-07-1933  Admit Date - 02/25/2015  Outpatient Primary MD for the patient is Loura Pardon, MD  Referring MD: Dr. Creig Hines  With History of -  Past Medical History  Diagnosis Date  . Osteopenia   . Hyperlipidemia   . Hypertension   . PSVT (paroxysmal supraventricular tachycardia)   . Spinal stenosis   . Overactive bladder     urge incontinence  . Pulmonary emboli   . Pulmonary hypertension   . Right ventricular failure   . Arthritis     knee  . Anemia   . Diverticulosis   . Sleep apnea     CPAP  . Hx of colonic polyps 10/24/2014      Past Surgical History  Procedure Laterality Date  . Cataract extraction Bilateral 2008  . Bladder suspension  1993  . Total knee arthroplasty Left 2000  . Total knee revision  01/12/2012    Procedure: TOTAL KNEE REVISION;  Surgeon: Kerin Salen, MD;  Location: [facility] [place];  Service: Orthopedics;  Laterality: Left;  DEPUY/ LCS , HAND SET  . Trigger finger release  01/12/2012    Procedure: RELEASE TRIGGER FINGER/A-1 PULLEY;  Surgeon: Kerin Salen, MD;  Location: [place];  Service: Orthopedics;  Laterality: Right;  . Cholecystectomy    . Incisional hernia repair    . Total knee arthroplasty Left 10/29/2012    at [facility]  . Total knee arthroplasty Right 03/09/2014  . Colonoscopy    . Right heart catheterization N/A 07/04/2014    Procedure: RIGHT HEART CATH;  Surgeon: Jolaine Artist, MD;  Location: [facility] CATH LAB;  Service: Cardiovascular;  Laterality: N/A;  . Tonsillectomy and adenoidectomy    . Colonoscopy N/A 10/24/2014    Procedure: COLONOSCOPY;  Surgeon: Gatha Mayer, MD;  Location: [facility] ENDOSCOPY;  Service: Endoscopy;  Laterality: N/A;    in for   Chief Complaint  Patient presents with  . Atrial Fibrillation     HPI This is a this is a 81 year old female patient  of multiple medical problems who was recently discharged in the hospital on 4/21 after being admitted for pneumonia. During the hospitalization she also was diagnosed with PSVT with episodes of bradycardia and question of sick sinus syndrome. She was noted to have an abnormal EKG with T-wave inversion, right bundle-branch block and prolonged QTC but this was also in the setting of concomitant use of Zithromax. During that hospitalization she was evaluated by electrophysiology. It was noted she had long-standing SVT that was managed medically and at that time patient did not wish to pursue ablation therapy. It was suspected the SVT was exacerbated by her recent illness. Her plans for her to follow up with electrophysiology after discharge.  Since discharge patient has not improved and endorses continued weakness and has never returned to baseline. She has underlying issues of dismobility chronically related to prior bilateral knee replacements. Since discharge  she has followed-up with cardiology, who felt the patient did not actually have pneumonia but likely had volume overload/heart failure and started her on Lasix on 4/26. They estimated the patient was up 20 pounds on her weight. She has also been evaluated by her pulmonologist to follows her for her sleep apnea. She underwent a high-resolution CT of the chest on 02/22/15. This revealed a small amount of air space consolidation with air bronchogram in the right lower lobe suggestive of pneumonia. There were no findings to suggest interstitial lung disease. A review of a recent echocardiogram revealed severe RV dilatation with moderate to severe pulmonary hypertension. Patient reports that since starting the Lasix she has had increased urinary output including need to void frequently nocturnally. She is also endorsing chest pain (which she has some difficulty clarifying) it appears to be in the middle of her chest and does not radiate or have any associated  symptoms. It occurs at rest and she does not ambulate enough to determine if there is an exertional component. She reports the pain starts out very mildly and appears to crescendo up and cause significant pain to the point she is crying but family states she has been anxious recently over her lack of progress physically. Patient did have a nonischemic stress test last month in April. Family has noticed patient has required more oxygen stating that her initial oxygen order was for nocturnal use only at 2 L/m but over the past 2 months she has had to utilize during the day. When questioned about her weakness and deconditioning symptoms and given the option of is she physically tired and weak with walking or can't breathe with walking patient chooses the option of "I can't breathe". Family verbalizes concerns over patient's mobility issues and failure to thrive.  In the ER she was afebrile, she was initially tachycardic with a heart rate of 122 and bedside telemetry with atrial fibrillation/atrial tachycardia. Since admission to ER she is maintaining sinus rhythm 65 bpm. She is normotensive. She is required 4 L nasal cannula oxygen to maintain O2 sats ration is a 95% at rest. Electrolyte panel was unremarkable except for elevated CO2 of 35. Her creatinine has increased slightly from 0.98 to 1.12. Troponin was normal 0.02. Her EKG appeared to be atrial fibrillation although she did have unusual P wave morphology with very tiny P waves with variable and prolonged PR interval. Her CBC was unremarkable. PT was 19.7 and INR 1.65 on eliquis glucose was 112. Stat x-ray demonstrated right pleural effusion and stable bronchitic changes.     Review of Systems   In addition to the HPI above,  No Fever-chills, myalgias or other constitutional symptoms No Headache, changes with Vision or hearing, new weakness, tingling, numbness in any extremity, No problems swallowing food or Liquids, indigestion/reflux No  palpitation No Abdominal pain, N/V; no melena or hematochezia, no dark tarry stools, Bowel movements are regular, No dysuria, hematuria or flank pain No new skin rashes, lesions, masses or bruises, No new joints pains-aches No recent weight gain or loss No polyuria, polydypsia or polyphagia,  *A full 10 point Review of Systems was done, except as stated above, all other Review of Systems were negative.  Social History History  Substance Use Topics  . Smoking status: Never Smoker   . Smokeless tobacco: Never Used  . Alcohol Use: No    Resides at: Private home  Lives with: Husband  Ambulatory status: Limited due to ongoing knee pain and dyspnea on exertion; requires  assistance of at least 1 person with walker   Family History Family History  Problem Relation Age of Onset  . Breast cancer Mother   . Heart disease Father   . Diabetes Father   . Hypertension Father   . Uterine cancer Sister      Prior to Admission medications   Medication Sig Start Date End Date Taking? Authorizing Provider  apixaban (ELIQUIS) 5 MG TABS tablet Take 1 tablet (5 mg total) by mouth 2 (two) times daily. 02/15/15  Yes Nishant Dhungel, MD  benzonatate (TESSALON) 200 MG capsule Take 1 capsule (200 mg total) by mouth 2 (two) times daily as needed for cough. 02/01/15  Yes Amy Cletis Athens, MD  Calcium Carb-Cholecalciferol (CALCIUM 600/VITAMIN D3) 600-800 MG-UNIT TABS Take 1 tablet by mouth 2 (two) times daily.   Yes Historical Provider, MD  cefUROXime (CEFTIN) 250 MG tablet Take 1 tablet (250 mg total) by mouth 2 (two) times daily with a meal. 02/19/15  Yes Burtis Junes, NP  diphenhydrAMINE (BENADRYL) 25 mg capsule Take 25 mg by mouth every morning.    Yes Historical Provider, MD  furosemide (LASIX) 20 MG tablet Take 2 tablets (40 mg total) by mouth for 3 days (02/20/2015 to 02/22/2015), then back to 1 tablet (20 mg total) by mouth daily. 02/20/15  Yes Burtis Junes, NP  metoprolol (LOPRESSOR) 50 MG tablet  Take 0.5 tablets (25 mg total) by mouth 2 (two) times daily. 02/16/15  Yes Evans Lance, MD  Multiple Vitamin (MULTIVITAMIN WITH MINERALS) TABS tablet Take 1 tablet by mouth daily.   Yes Historical Provider, MD  ondansetron (ZOFRAN) 4 MG tablet Take 1 tablet (4 mg total) by mouth every 8 (eight) hours as needed for nausea or vomiting. 02/15/15  Yes Nishant Dhungel, MD  OXYGEN Inhale 2 L/min into the lungs continuous.    Yes Historical Provider, MD  polyethylene glycol (MIRALAX / GLYCOLAX) packet Take 8.5 g by mouth daily.    Yes Historical Provider, MD  potassium chloride (K-DUR) 10 MEQ tablet Take 1 tablet (10 mEq total) by mouth daily. 02/20/15  Yes Burtis Junes, NP  vitamin C (ASCORBIC ACID) 500 MG tablet Take 500 mg by mouth daily.   Yes Historical Provider, MD    Allergies  Allergen Reactions  . Penicillins Hives  . Sulfonamide Derivatives Hives  . Zithromax [Azithromycin] Other (See Comments)    Prolonged QT on EKG  . Adhesive [Tape] Other (See Comments)    redness  . Prednisone Other (See Comments)    Causes insomnia    Physical Exam  Vitals  Blood pressure 119/68, pulse 60, temperature 97.7 F (36.5 C), temperature source Oral, resp. rate 27, height 5\' 6"  (1.676 m), weight 220 lb (99.791 kg), SpO2 94 %.   General:  In no acute distress, appears chronically ill and somewhat fatigued  Psych:  Normal affect, Denies Suicidal or Homicidal ideations, Awake Alert, Oriented X 3. Speech and thought patterns are clear and appropriate, no apparent short term memory deficits  Neuro:   No focal neurological deficits, CN II through XII intact, Strength 5/5 all 4 extremities, Sensation intact all 4 extremities.  ENT:  Ears and Eyes appear Normal, Conjunctivae clear, PER. Moist oral mucosa without erythema or exudates.  Neck:  Supple, No lymphadenopathy appreciated  Respiratory:  Symmetrical chest wall movement, Good air movement bilaterally, since the course auscultation somewhat  diminished right base with crackles left base, no expiratory wheezing, 4 L oxygen  Cardiac:  RRR,  No Murmurs, 1+ bilateral LE edema noted, no JVD, No carotid bruits, peripheral pulses palpable at 2+  Abdomen:  Positive bowel sounds, Soft, Non tender, Non distended,  No masses appreciated, no obvious hepatosplenomegaly  Skin:  No Cyanosis, Normal Skin Turgor, No Skin Rash or Bruise.  Extremities: Symmetrical without obvious trauma or injury,  no effusions.  Data Review  CBC  Recent Labs Lab 02/19/15 1527 02/25/15 0559  WBC 6.8 6.2  HGB 12.7 12.7  HCT 39.0 42.9  PLT 186.0 153  MCV 91.6 98.8  MCH  --  29.3  MCHC 32.5 29.6*  RDW 16.1* 16.8*  LYMPHSABS  --  1.0  MONOABS  --  0.7  EOSABS  --  0.2  BASOSABS  --  0.0    Chemistries   Recent Labs Lab 02/19/15 1527 02/25/15 0559  NA 135 137  K 4.5 4.1  CL 101 94*  CO2 33* 35*  GLUCOSE 109* 112*  BUN 33* 19  CREATININE 0.98 1.12*  CALCIUM 9.6 9.3    estimated creatinine clearance is 48.5 mL/min (by C-G formula based on Cr of 1.12).  No results for input(s): TSH, T4TOTAL, T3FREE, THYROIDAB in the last 72 hours.  Invalid input(s): FREET3  Coagulation profile  Recent Labs Lab 02/25/15 0559  INR 1.65*    No results for input(s): DDIMER in the last 72 hours.  Cardiac Enzymes No results for input(s): CKMB, TROPONINI, MYOGLOBIN in the last 168 hours.  Invalid input(s): CK  Invalid input(s): POCBNP  Urinalysis    Component Value Date/Time   COLORURINE AMBER* 02/10/2015 1303   APPEARANCEUR CLOUDY* 02/10/2015 1303   LABSPEC 1.024 02/10/2015 1303   PHURINE 5.0 02/10/2015 1303   GLUCOSEU NEGATIVE 02/10/2015 1303   HGBUR NEGATIVE 02/10/2015 1303   HGBUR trace-intact 10/23/2009 1541   BILIRUBINUR NEGATIVE 02/10/2015 1303   BILIRUBINUR trace 10/13/2014 1028   KETONESUR NEGATIVE 02/10/2015 1303   PROTEINUR NEGATIVE 02/10/2015 1303   PROTEINUR 15+ 10/13/2014 1028   UROBILINOGEN 0.2 02/10/2015 1303    UROBILINOGEN 0.2 10/13/2014 1028   NITRITE NEGATIVE 02/10/2015 1303   NITRITE neg. 10/13/2014 1028   LEUKOCYTESUR NEGATIVE 02/10/2015 1303    Imaging results:   Dg Chest 2 View  02/25/2015   CLINICAL DATA:  Chest pain and shortness of breath.  EXAM: CHEST  2 VIEW  COMPARISON:  Chest radiograph February 10, 2015 and CT of the chest February 22, 2015  FINDINGS: The cardiac silhouette is moderately enlarged, unchanged. Tortuous aorta. Small to moderate RIGHT pleural effusion was present previously. Similar bronchitic changes. Minimal biapical pleural thickening. No pneumothorax. Soft tissue planes and included osseous structure nonsuspicious.  IMPRESSION: Stable cardiomegaly. Similar bronchitic changes with small to moderate RIGHT pleural effusion. Recommend followup chest radiograph after treatment to verify improvement.   Electronically Signed   By: Elon Alas   On: 02/25/2015 06:51   Ct Chest High Resolution  02/22/2015   CLINICAL DATA:  81 year old female with shortness breath on exertion. History of pulmonary hypertension. Cough. Evaluate for interstitial lung disease.  EXAM: CT CHEST WITHOUT CONTRAST  TECHNIQUE: Multidetector CT imaging of the chest was performed following the standard protocol without intravenous contrast. High resolution imaging of the lungs, as well as inspiratory and expiratory imaging, was performed.  COMPARISON:  Chest CT 03/13/2014.  FINDINGS: Mediastinum/Lymph Nodes: Heart size is mildly enlarged, particularly the right atrium and right ventricle. Small amount of pericardial fluid and/or thickening, unlikely to be of any hemodynamic significance at this time. No associated pericardial  calcification. There is atherosclerosis of the thoracic aorta, the great vessels of the mediastinum and the coronary arteries, including calcified atherosclerotic plaque in the left anterior descending, left circumflex and right coronary arteries. Mild dilatation of the pulmonic trunk (3.4 cm in  diameter), compatible with the reported clinical history of pulmonary arterial hypertension. No pathologically enlarged mediastinal or hilar lymph nodes. Please note that accurate exclusion of hilar adenopathy is limited on noncontrast CT scans. Esophagus is unremarkable in appearance. No axillary lymphadenopathy.  Lungs/Pleura: Small right pleural effusion lying dependently. Small amount of airspace consolidation in the anterior aspect of the right lower lobe where there are some air bronchograms. No suspicious appearing pulmonary nodules or masses. High-resolution images demonstrate no significant regions of ground-glass attenuation, subpleural reticulation, parenchymal banding or frank honeycombing. There is some mild cylindrical bronchiectasis noted in the right lower lobe. Inspiratory and expiratory imaging is unremarkable.  Upper Abdomen: Status post cholecystectomy.  Musculoskeletal/Soft Tissues: Old healed fracture of the mid sternum with mild posttraumatic deformity, unchanged. Chronic compression fracture of T12 with approximately 20% loss of anterior vertebral body height also unchanged.  IMPRESSION: 1. No findings to suggest interstitial lung disease at this time. 2. Small amount of airspace consolidation with air bronchograms in the right lower lobe, suggestive of pneumonia. There is also some very mild cylindrical bronchiectasis in the right lower lobe, and a small right-sided parapneumonic pleural effusion. 3. Atherosclerosis, including three vessel coronary artery disease. Assessment for potential risk factor modification, dietary therapy or pharmacologic therapy may be warranted, if clinically indicated. 4. Cardiomegaly with right ventricular and right atrial dilatation, as well as mild dilatation of the pulmonic trunk; imaging findings compatible with the reported clinical history of pulmonary arterial hypertension.   Electronically Signed   By: Vinnie Langton M.D.   On: 02/22/2015 09:35   Nm  Myocar Multi W/spect W/wall Motion / Ef  02/12/2015   CLINICAL DATA:  Ms. Eliberto Ivory is a 81 year old female who is admitted to the hospital with episodes of chest pain. She was scheduled for a Patterson study for further evaluation.  EXAM: MYOCARDIAL IMAGING WITH SPECT (REST AND PHARMACOLOGIC-STRESS)  GATED LEFT VENTRICULAR WALL MOTION STUDY  LEFT VENTRICULAR EJECTION FRACTION  TECHNIQUE: Standard myocardial SPECT imaging was performed after resting intravenous injection of 10 mCi Tc-5m sestamibi. Subsequently, intravenous infusion of Lexiscan was performed under the supervision of the Cardiology staff. At peak effect of the drug, 30 mCi Tc-36m sestamibi was injected intravenously and standard myocardial SPECT imaging was performed. Quantitative gated imaging was also performed to evaluate left ventricular wall motion, and estimate left ventricular ejection fraction.  COMPARISON:  None.  FINDINGS: Review of the EKG tracings reveals no significant ST or T wave changes.  Raw data images:  There is no significant motion artifact.  Stress images: There is smooth and homogeneous uptake in all areas of the myocardium.  Resting images: Smooth and homogeneous uptake in all areas of the myocardium. There is no evidence of ischemia or infarction.  Wall Motion:  The left ventricle systolic function is normal.  Left Ventricular Ejection Fraction: 57 %  End diastolic volume 38 ml  End systolic volume 16 ml  IMPRESSION: 1.  Normal Lexiscan Myoview study.  2.  The left ventricle systolic function is normal  3. Left ventricular ejection fraction 57%  *2012 Appropriate Use Criteria for Coronary Revascularization Focused Update: J Am Coll Cardiol. 6734;19(3):790-240. http://content.airportbarriers.com.aspx?articleid=1201161  Mertie Moores, MD, [facility]   Electronically Signed   By: Mertie Moores  On: 02/12/2015 16:45   Dg Chest Port 1 View  02/10/2015   CLINICAL DATA:  Acute onset of weakness. History of right ventricular  failure and hypertension.  EXAM: PORTABLE CHEST - 1 VIEW  COMPARISON:  05/30/2014  FINDINGS: The heart is mildly enlarged. There is tortuosity and mild ectasia of the thoracic aorta. There are chronic emphysematous and bronchitic lung changes but no acute overlying pulmonary process. No pleural effusion. The bony thorax is intact.  IMPRESSION: Mild cardiac enlargement.  Chronic lung changes but no acute overlying pulmonary process.   Electronically Signed   By: Marijo Sanes M.D.   On: 02/10/2015 13:25     EKG: (Independently reviewed) atrial fibrillation with unusual P-wave morphology with apparent variable P-R intervals   Assessment & Plan  Principal Problem:   Chest pain -Admit to telemetry -Cycle cardiac enzymes -Low likelihood of ischemic etiology given recent nonischemic stress test in April; suspect underlying tachycardia arrhythmias contributing -Have consulted cardiologist-I spoke with Dr. Roddie Mc -Continue beta blocker and eliquis  Active Problems:   Acute on chronic respiratory failure with hypoxia:   A) Pulmonary hypertension, moderate to severe   B) Acute right ventricular heart failure   C) Acute cor pulmonale -Suspect exacerbation of right heart failure in setting of noncompliance with CPAP and possibly from recurrent tachycardia arrhythmias -Based on report has responded nicely to low-dose Lasix 20 mg by mouth daily; will begin Lasix 40 mg IV daily and monitor response-provide potassium replacement -Consider afterload reduction agents such as nitrites or hydralazine if blood pressure will tolerate -There was evidence of a small area of consolidation concerning for pneumonia on recent CT the patient recently completed a course of antibiotics, no fever or leukocytosis at presentation so no indication to resume antibiotics at this juncture -Does have history of PE but currently on full dose anticoagulation with eliquis -Encourage compliance with CPAP at home as well as in the  hospital-this has been an ongoing issue with this patient and has been further exacerbated by her frequent nocturnal voidings after starting Lasix      Dyslipidemia -Not on statin therapy prior to admission    Essential hypertension -Continue beta blocker    PSVT with SSS -May be contributing to all the above symptoms -Evaluated by electrophysiology Dr. Rayann Heman last admission and ablation recommended but patient declined -Unclear if additional medications can be added or if ablation still at the top of the list of recommendations; cardiology has been consulted    Physical deconditioning/prior bilateral knee replacements/known spinal stenosis -Progressive hypoxemia as well as issues with this mobility contributing to deconditioning -PT/OT evaluations -May benefit from CIR     DVT Prophylaxis: Eliquis  Family Communication:   Husband and daughter at bedside  Code Status:  DO NOT RESUSCITATE  Condition:  Stable  Discharge disposition: Anticipate discharge to home although pending PT/OT evaluations may benefit from short-term rehabilitation; anticipate hospitalization at least for 3-4 days  Time spent in minutes : 60   Britian Jentz L. ANP on 02/25/2015 at 9:15 AM  Between 7am to 7pm - Pager - [phone]  After 7pm go to [email or web address] - password [email or web address]  And look for the night coverage person covering me after hours  [facility]

## 2015-02-25 NOTE — ED Provider Notes (Signed)
CSN: [id number]     Arrival date & time 02/25/15  0527 History   First MD Initiated Contact with Patient 02/25/15 [id number]     Chief Complaint  Patient presents with  . Atrial Fibrillation     (Consider location/radiation/quality/duration/timing/severity/associated sxs/prior Treatment) HPI [name] is a 81 y.o. female with a history of PSVT, PE, recent admission for sepsis and subsequent discharge 2 weeks ago comes in for evaluation of chest pain. Patient reports she was up and down all night using the bathroom due to her just starting Lasix. Reports she saw her cardiologist on the 28th and started on Lasix. She reports approximately 4:30 AM she began to experience central, sharp, fleeting chest pains. She reports that she is short of breath at baseline and uses 2 L of oxygen nasal cannula at home. Denies any chest discomfort now. Also reports having recently finished ceftin antibiotic therapy for previously diagnosed pneumonia. No other aggravating or modifying factors. Takes metoprolol and Eliquis, is unsure if she took medications today.   Past Medical History  Diagnosis Date  . Osteopenia   . Hyperlipidemia   . Hypertension   . PSVT (paroxysmal supraventricular tachycardia)   . Spinal stenosis   . Overactive bladder     urge incontinence  . Pulmonary emboli   . Pulmonary hypertension   . Right ventricular failure   . Arthritis     knee  . Anemia   . Diverticulosis   . Sleep apnea     CPAP  . Hx of colonic polyps 10/24/2014   Past Surgical History  Procedure Laterality Date  . Cataract extraction Bilateral 2008  . Bladder suspension  1993  . Total knee arthroplasty Left 2000  . Total knee revision  01/12/2012    Procedure: TOTAL KNEE REVISION;  Surgeon: Kerin Salen, MD;  Location: [place];  Service: Orthopedics;  Laterality: Left;  DEPUY/ LCS , HAND SET  . Trigger finger release  01/12/2012    Procedure: RELEASE TRIGGER FINGER/A-1 PULLEY;  Surgeon: Kerin Salen, MD;   Location: [place];  Service: Orthopedics;  Laterality: Right;  . Cholecystectomy    . Incisional hernia repair    . Total knee arthroplasty Left 10/29/2012    at [facility]  . Total knee arthroplasty Right 03/09/2014  . Colonoscopy    . Right heart catheterization N/A 07/04/2014    Procedure: RIGHT HEART CATH;  Surgeon: Jolaine Artist, MD;  Location: [facility] CATH LAB;  Service: Cardiovascular;  Laterality: N/A;  . Tonsillectomy and adenoidectomy    . Colonoscopy N/A 10/24/2014    Procedure: COLONOSCOPY;  Surgeon: Gatha Mayer, MD;  Location: [facility] ENDOSCOPY;  Service: Endoscopy;  Laterality: N/A;   Family History  Problem Relation Age of Onset  . Breast cancer Mother   . Heart disease Father   . Diabetes Father   . Hypertension Father   . Uterine cancer Sister    History  Substance Use Topics  . Smoking status: Never Smoker   . Smokeless tobacco: Never Used  . Alcohol Use: No   OB History    No data available     Review of Systems A 10 point review of systems was completed and was negative except for pertinent positives and negatives as mentioned in the history of present illness     Allergies  Penicillins; Sulfonamide derivatives; Zithromax; Adhesive; and Prednisone  Home Medications   Prior to Admission medications   Medication Sig Start Date End Date Taking? Authorizing Provider  apixaban (ELIQUIS) 5 MG TABS tablet Take 1 tablet (5 mg total) by mouth 2 (two) times daily. 02/15/15  Yes Nishant Dhungel, MD  benzonatate (TESSALON) 200 MG capsule Take 1 capsule (200 mg total) by mouth 2 (two) times daily as needed for cough. 02/01/15  Yes Amy Cletis [place], MD  Calcium Carb-Cholecalciferol (CALCIUM 600/VITAMIN D3) 600-800 MG-UNIT TABS Take 1 tablet by mouth 2 (two) times daily.   Yes Historical Provider, MD  cefUROXime (CEFTIN) 250 MG tablet Take 1 tablet (250 mg total) by mouth 2 (two) times daily with a meal. 02/19/15  Yes Burtis Junes, NP  diphenhydrAMINE (BENADRYL) 25 mg capsule Take  25 mg by mouth every morning.    Yes Historical Provider, MD  furosemide (LASIX) 20 MG tablet Take 2 tablets (40 mg total) by mouth for 3 days (02/20/2015 to 02/22/2015), then back to 1 tablet (20 mg total) by mouth daily. 02/20/15  Yes Burtis Junes, NP  metoprolol (LOPRESSOR) 50 MG tablet Take 0.5 tablets (25 mg total) by mouth 2 (two) times daily. 02/16/15  Yes Evans Lance, MD  Multiple Vitamin (MULTIVITAMIN WITH MINERALS) TABS tablet Take 1 tablet by mouth daily.   Yes Historical Provider, MD  ondansetron (ZOFRAN) 4 MG tablet Take 1 tablet (4 mg total) by mouth every 8 (eight) hours as needed for nausea or vomiting. 02/15/15  Yes Nishant Dhungel, MD  OXYGEN Inhale 2 L/min into the lungs continuous.    Yes Historical Provider, MD  polyethylene glycol (MIRALAX / GLYCOLAX) packet Take 8.5 g by mouth daily.    Yes Historical Provider, MD  potassium chloride (K-DUR) 10 MEQ tablet Take 1 tablet (10 mEq total) by mouth daily. 02/20/15  Yes Burtis Junes, NP  vitamin C (ASCORBIC ACID) 500 MG tablet Take 500 mg by mouth daily.   Yes Historical Provider, MD   BP 126/71 mmHg  Pulse 62  Temp(Src) 97.7 F (36.5 C) (Oral)  Resp 19  Ht 5\' 6"  (1.676 m)  Wt 220 lb (99.791 kg)  BMI 35.53 kg/m2  SpO2 93% Physical Exam  Constitutional: She is oriented to person, place, and time. She appears well-developed and well-nourished.  HENT:  Head: Normocephalic and atraumatic.  Mouth/Throat: Oropharynx is clear and moist.  Eyes: Conjunctivae are normal. Pupils are equal, round, and reactive to light. Right eye exhibits no discharge. Left eye exhibits no discharge. No scleral icterus.  Neck: Neck supple.  Cardiovascular: Normal rate, regular rhythm and normal heart sounds.   Patient coughs and switches back and forth between apparent atrial tachycardia rate 120s back to sinus rhythm and 60s.  Pulmonary/Chest: Effort normal and breath sounds normal. No respiratory distress. She has no wheezes. She has no rales.   Abdominal: Soft. There is no tenderness.  Musculoskeletal: She exhibits no tenderness.  Neurological: She is alert and oriented to person, place, and time.  Cranial Nerves II-XII grossly intact  Skin: Skin is warm and dry. No rash noted.  Psychiatric: She has a normal mood and affect.  Nursing note and vitals reviewed.   ED Course  Procedures (including critical care time) Labs Review Labs Reviewed  CBC WITH DIFFERENTIAL/PLATELET - Abnormal; Notable for the following:    MCHC 29.6 (*)    RDW 16.8 (*)    All other components within normal limits  BASIC METABOLIC PANEL - Abnormal; Notable for the following:    Chloride 94 (*)    CO2 35 (*)    Glucose, Bld 112 (*)  Creatinine, Ser 1.12 (*)    GFR calc non Af Amer 45 (*)    GFR calc Af Amer 53 (*)    All other components within normal limits  PROTIME-INR - Abnormal; Notable for the following:    Prothrombin Time 19.7 (*)    INR 1.65 (*)    All other components within normal limits  TROPONIN I  TROPONIN I  TROPONIN I  I-STAT TROPOININ, ED    Imaging Review Dg Chest 2 View  02/25/2015   CLINICAL DATA:  Chest pain and shortness of breath.  EXAM: CHEST  2 VIEW  COMPARISON:  Chest radiograph February 10, 2015 and CT of the chest February 22, 2015  FINDINGS: The cardiac silhouette is moderately enlarged, unchanged. Tortuous aorta. Small to moderate RIGHT pleural effusion was present previously. Similar bronchitic changes. Minimal biapical pleural thickening. No pneumothorax. Soft tissue planes and included osseous structure nonsuspicious.  IMPRESSION: Stable cardiomegaly. Similar bronchitic changes with small to moderate RIGHT pleural effusion. Recommend followup chest radiograph after treatment to verify improvement.   Electronically Signed   By: Elon Alas   On: 02/25/2015 06:51     EKG Interpretation None      MUSE down  ED ECG REPORT   Date: 02/25/2015  Rate: 122  Rhythm: supraventricular tachycardia (SVT)  QRS Axis:  normal  Intervals: PR prolonged  ST/T Wave abnormalities: nonspecific ST changes  Conduction Disutrbances:none  Narrative Interpretation:   Old EKG Reviewed: unchanged  I have personally reviewed the EKG tracing and agree with the computerized printout as noted.    MDM  Vitals stable - afebrile Pt resting comfortably in ED. no chest pain in the ED. PE--patient coughs and transitions from a sinus rhythm in the 60s, to a rate of 120s. No chest pain during the rapid heart rate. Labwork--essentially noncontributory. Troponin negative. EKG unchanged. INR 1.65 Imaging--chest x-ray shows stable cardiomegaly. Similar bronchitic changes with small to moderate right pleural effusion.  Discussed patient presentation and ED course with attending, Dr. Claudine Mouton who agrees with plan to consult medicine for admission. Spoke with Ebony Hail, patient admitted to telemetry.  Final diagnoses:  Chest pain       Comer Locket, PA-C 02/25/15 [id number]  Everlene Balls, MD 02/25/15 [id number]

## 2015-02-25 NOTE — ED Notes (Signed)
Pt arrives with EMS from home with c/o symptomatic afib, states she woke up with some chest pain that has since resolved. Cannot recall if she was [facility]. AFIB with hx of the same, rate 60s-120s. Pt states she wears CPAP at home and O2, states she didn't wear CPAP last night

## 2015-02-26 DIAGNOSIS — I471 Supraventricular tachycardia: Secondary | ICD-10-CM

## 2015-02-26 DIAGNOSIS — I270 Primary pulmonary hypertension: Secondary | ICD-10-CM

## 2015-02-26 DIAGNOSIS — R079 Chest pain, unspecified: Secondary | ICD-10-CM

## 2015-02-26 DIAGNOSIS — J9621 Acute and chronic respiratory failure with hypoxia: Secondary | ICD-10-CM | POA: Diagnosis not present

## 2015-02-26 DIAGNOSIS — I272 Other secondary pulmonary hypertension: Secondary | ICD-10-CM | POA: Diagnosis not present

## 2015-02-26 DIAGNOSIS — E785 Hyperlipidemia, unspecified: Secondary | ICD-10-CM | POA: Diagnosis not present

## 2015-02-26 DIAGNOSIS — M4806 Spinal stenosis, lumbar region: Secondary | ICD-10-CM | POA: Diagnosis not present

## 2015-02-26 DIAGNOSIS — I10 Essential (primary) hypertension: Secondary | ICD-10-CM | POA: Diagnosis not present

## 2015-02-26 DIAGNOSIS — R0789 Other chest pain: Secondary | ICD-10-CM | POA: Diagnosis not present

## 2015-02-26 DIAGNOSIS — I509 Heart failure, unspecified: Secondary | ICD-10-CM | POA: Diagnosis not present

## 2015-02-26 LAB — BASIC METABOLIC PANEL
ANION GAP: 7 (ref 5–15)
BUN: 19 mg/dL (ref 6–20)
CHLORIDE: 93 mmol/L — AB (ref 101–111)
CO2: 38 mmol/L — AB (ref 22–32)
Calcium: 8.9 mg/dL (ref 8.9–10.3)
Creatinine, Ser: 1.07 mg/dL — ABNORMAL HIGH (ref 0.44–1.00)
GFR calc non Af Amer: 48 mL/min — ABNORMAL LOW (ref >60)
GFR, EST AFRICAN AMERICAN: 56 mL/min — AB (ref >60)
Glucose, Bld: 120 mg/dL — ABNORMAL HIGH (ref 70–99)
POTASSIUM: 4.5 mmol/L (ref 3.5–5.1)
Sodium: 138 mmol/L (ref 135–145)

## 2015-02-26 MED ORDER — ISOSORBIDE DINITRATE 10 MG PO TABS
10.0000 mg | ORAL_TABLET | Freq: Three times a day (TID) | ORAL | Status: DC
  Administered 2015-02-26 – 2015-03-02 (×13): 10 mg via ORAL
  Filled 2015-02-26 (×20): qty 1

## 2015-02-26 MED ORDER — ENOXAPARIN SODIUM 60 MG/0.6ML ~~LOC~~ SOLN
50.0000 mg | SUBCUTANEOUS | Status: DC
  Administered 2015-02-26 – 2015-02-27 (×2): 50 mg via SUBCUTANEOUS
  Filled 2015-02-26 (×3): qty 0.6

## 2015-02-26 MED ORDER — FUROSEMIDE 20 MG PO TABS
20.0000 mg | ORAL_TABLET | Freq: Every day | ORAL | Status: DC
  Administered 2015-02-26 – 2015-03-03 (×6): 20 mg via ORAL
  Filled 2015-02-26 (×7): qty 1

## 2015-02-26 NOTE — Consult Note (Signed)
Patient ID: [name] MRN: [id number], DOB/AGE: [birth year]   Admit date: 02/25/2015   Primary Physician: Loura Pardon, MD Primary Cardiologist: Dr. Rayann Heman  Pt. Profile:  81 y/o female with a past medical history significant for recurrent paroxysmal atrial tachycardia and pulmonary hypertension. Admitted 02/25/15 with chest pain. Found to have extremely frequent bursts of paroxysmal atrial tachycardia all day, roughly 120 bpm, also with periods of bradycardia with rates in the 40s.  Problem List  Past Medical History  Diagnosis Date  . Osteopenia   . Hyperlipidemia   . Hypertension   . PSVT (paroxysmal supraventricular tachycardia)   . Spinal stenosis   . Overactive bladder     urge incontinence  . Pulmonary emboli   . Pulmonary hypertension   . Right ventricular failure   . Arthritis     knee  . Anemia   . Diverticulosis   . Sleep apnea     CPAP  . Hx of colonic polyps 10/24/2014    Past Surgical History  Procedure Laterality Date  . Cataract extraction Bilateral 2008  . Bladder suspension  1993  . Total knee arthroplasty Left 2000  . Total knee revision  01/12/2012    Procedure: TOTAL KNEE REVISION;  Surgeon: Kerin Salen, MD;  Location: [place];  Service: Orthopedics;  Laterality: Left;  DEPUY/ LCS , HAND SET  . Trigger finger release  01/12/2012    Procedure: RELEASE TRIGGER FINGER/A-1 PULLEY;  Surgeon: Kerin Salen, MD;  Location: [place];  Service: Orthopedics;  Laterality: Right;  . Cholecystectomy    . Incisional hernia repair    . Total knee arthroplasty Left 10/29/2012    at [facility]  . Total knee arthroplasty Right 03/09/2014  . Colonoscopy    . Right heart catheterization N/A 07/04/2014    Procedure: RIGHT HEART CATH;  Surgeon: Jolaine Artist, MD;  Location: [facility] CATH LAB;  Service: Cardiovascular;  Laterality: N/A;  . Tonsillectomy and adenoidectomy    . Colonoscopy N/A 10/24/2014    Procedure: COLONOSCOPY;  Surgeon: Gatha Mayer, MD;  Location: [facility]  ENDOSCOPY;  Service: Endoscopy;  Laterality: N/A;     Allergies  Allergies  Allergen Reactions  . Penicillins Hives  . Sulfonamide Derivatives Hives  . Zithromax [Azithromycin] Other (See Comments)    Prolonged QT on EKG  . Adhesive [Tape] Other (See Comments)    redness  . Prednisone Other (See Comments)    Causes insomnia    HPI  The patient is a 81 y/o female, followed by Dr. Rayann Heman. She has a h/o SVT (atrial tachycardia) which has been fairly controlled in the past on beta blockers and verapamil, up until recently. Per Dr. Jackalyn Lombard last [facility] note 05/03/14, the patient did not wish to consider ablation at that time. She also has a h/o PE,  pulmonary HTN, right sided HF, OSA, obesity and HLD.   She was recently admitted 2 weeks ago for chest pain, dyspnea and a nonproductive cough. EKG at that time demonstrated severe QT interval prolongation and prominent anterior T-wave inversion (in the setting of outpatient azithromycin use). She was admitted for ACS r/o and possible PNA. She as placed on empiric antibiotics (azithromycin discontinue and changed to Ceftin). Her QT interval was monitored and improved.  She had mildly elevated troponins. Subequent NST was low risk for ischemia and she was noted to have normal LV function with moderate pulmonary HTN.  She was also noted to have recurrent SVT during that admission but  also with bradycardia with rates in the 40s rasing concern for SSS. She remained fairly asymptomatic, except for fatigue. Decision was made to increase her metoprolol to 50 mg BID and verapamil was discontinued. She was also placed on Eliquis and instructed to f/u with Dr. Rayann Heman (scheduled 5/22). She was actually seen by Truitt Merle, NP, for post hospital f/u 4 days after discharge on 02/19/15. Per office note, she continued to complain of symptoms of fatigue and weakness, in the setting bradycardia with rates in the 40s. It was then decided to decrease her metoprolol back down to  25 mg BID.   She presented back to [facility] on 02/25/15 with complaint of chest pain. W/u for chest pain as well as re-evaluating the severity of her pulmonary HTN is being conducted by general cardiology. She is scheduled for [facility] on 5/4. EP has been consulted for recommendations regarding her recurrent/high burden SVT. Telemetry currently shows atrial tachycardia with a rate in the 120s. Occasionally, she bradys down into the 67s. Currently, she notes feeling fatigue. She feels palpitations but denies further CP. So significant dyspnea, lightheadedness or dizziness. No syncope/ near syncope. After further discussion of treatment options she is now more open to the idea of catheter ablation.     Home Medications  Prior to Admission medications   Medication Sig Start Date End Date Taking? Authorizing Provider  apixaban (ELIQUIS) 5 MG TABS tablet Take 1 tablet (5 mg total) by mouth 2 (two) times daily. 02/15/15  Yes Nishant Dhungel, MD  benzonatate (TESSALON) 200 MG capsule Take 1 capsule (200 mg total) by mouth 2 (two) times daily as needed for cough. 02/01/15  Yes Amy Cletis Athens, MD  Calcium Carb-Cholecalciferol (CALCIUM 600/VITAMIN D3) 600-800 MG-UNIT TABS Take 1 tablet by mouth 2 (two) times daily.   Yes Historical Provider, MD  cefUROXime (CEFTIN) 250 MG tablet Take 1 tablet (250 mg total) by mouth 2 (two) times daily with a meal. 02/19/15  Yes Burtis Junes, NP  diphenhydrAMINE (BENADRYL) 25 mg capsule Take 25 mg by mouth every morning.    Yes Historical Provider, MD  furosemide (LASIX) 20 MG tablet Take 2 tablets (40 mg total) by mouth for 3 days (02/20/2015 to 02/22/2015), then back to 1 tablet (20 mg total) by mouth daily. 02/20/15  Yes Burtis Junes, NP  metoprolol (LOPRESSOR) 50 MG tablet Take 0.5 tablets (25 mg total) by mouth 2 (two) times daily. 02/16/15  Yes Evans Lance, MD  Multiple Vitamin (MULTIVITAMIN WITH MINERALS) TABS tablet Take 1 tablet by mouth daily.   Yes Historical Provider, MD    ondansetron (ZOFRAN) 4 MG tablet Take 1 tablet (4 mg total) by mouth every 8 (eight) hours as needed for nausea or vomiting. 02/15/15  Yes Nishant Dhungel, MD  OXYGEN Inhale 2 L/min into the lungs continuous.    Yes Historical Provider, MD  polyethylene glycol (MIRALAX / GLYCOLAX) packet Take 8.5 g by mouth daily.    Yes Historical Provider, MD  potassium chloride (K-DUR) 10 MEQ tablet Take 1 tablet (10 mEq total) by mouth daily. 02/20/15  Yes Burtis Junes, NP  vitamin C (ASCORBIC ACID) 500 MG tablet Take 500 mg by mouth daily.   Yes Historical Provider, MD    Family History  Family History  Problem Relation Age of Onset  . Breast cancer Mother   . Heart disease Father   . Diabetes Father   . Hypertension Father   . Uterine cancer Sister  Social History  History   Social History  . Marital Status: Widowed    Spouse Name: N/A  . Number of Children: 3  . Years of Education: N/A   Occupational History  . Retired   .     Social History Main Topics  . Smoking status: Never Smoker   . Smokeless tobacco: Never Used  . Alcohol Use: No  . Drug Use: No  . Sexual Activity: No   Other Topics Concern  . Not on file   Social History Narrative   Pt lives alone but dtr lives on property and has been staying with her since surgery.     Review of Systems General:  No chills, fever, night sweats or weight changes.  Cardiovascular:  No chest pain, dyspnea on exertion, edema, orthopnea, palpitations, paroxysmal nocturnal dyspnea. Dermatological: No rash, lesions/masses Respiratory: No cough, dyspnea Urologic: No hematuria, dysuria Abdominal:   No nausea, vomiting, diarrhea, bright red blood per rectum, melena, or hematemesis Neurologic:  No visual changes, wkns, changes in mental status. All other systems reviewed and are otherwise negative except as noted above.  Physical Exam  Blood pressure 132/79, pulse 79, temperature 98.3 F (36.8 C), temperature source Oral, resp.  rate 20, height 5\' 6"  (1.676 m), weight 220 lb 10.9 oz (100.1 kg), SpO2 94 %.  General: Pleasant, NAD Psych: Normal affect. Neuro: Alert and oriented X 3. Moves all extremities spontaneously. HEENT: Normal  Neck: Supple without bruits or JVD. Lungs:  Resp regular and unlabored, CTA. Heart: regular rhythm, tachy rate  Abdomen: Soft, non-tender, non-distended, BS + x 4.  Extremities: No clubbing, cyanosis or edema. DP/PT/Radials 2+ and equal bilaterally.  Labs  Troponin [facility] Test)  Recent Labs  02/25/15 0553  TROPIPOC 0.02    Recent Labs  02/25/15 0827 02/25/15 1443 02/25/15 2021  TROPONINI 0.04* 0.03 0.04*   Lab Results  Component Value Date   WBC 6.2 02/25/2015   HGB 12.7 02/25/2015   HCT 42.9 02/25/2015   MCV 98.8 02/25/2015   PLT 153 02/25/2015    Recent Labs Lab 02/26/15 0836  NA 138  K 4.5  CL 93*  CO2 38*  BUN 19  CREATININE 1.07*  CALCIUM 8.9  GLUCOSE 120*   Lab Results  Component Value Date   CHOL 104 03/14/2014   HDL 32* 03/14/2014   LDLCALC 53 03/14/2014   TRIG 95 03/14/2014   Lab Results  Component Value Date   DDIMER 5.74* 11/23/2012     Radiology/Studies  Dg Chest 2 View  02/25/2015   CLINICAL DATA:  Chest pain and shortness of breath.  EXAM: CHEST  2 VIEW  COMPARISON:  Chest radiograph February 10, 2015 and CT of the chest February 22, 2015  FINDINGS: The cardiac silhouette is moderately enlarged, unchanged. Tortuous aorta. Small to moderate RIGHT pleural effusion was present previously. Similar bronchitic changes. Minimal biapical pleural thickening. No pneumothorax. Soft tissue planes and included osseous structure nonsuspicious.  IMPRESSION: Stable cardiomegaly. Similar bronchitic changes with small to moderate RIGHT pleural effusion. Recommend followup chest radiograph after treatment to verify improvement.   Electronically Signed   By: Elon Alas   On: 02/25/2015 06:51   Ct Chest High Resolution  02/22/2015   CLINICAL DATA:   81 year old female with shortness breath on exertion. History of pulmonary hypertension. Cough. Evaluate for interstitial lung disease.  EXAM: CT CHEST WITHOUT CONTRAST  TECHNIQUE: Multidetector CT imaging of the chest was performed following the standard protocol without intravenous contrast.  High resolution imaging of the lungs, as well as inspiratory and expiratory imaging, was performed.  COMPARISON:  Chest CT 03/13/2014.  FINDINGS: Mediastinum/Lymph Nodes: Heart size is mildly enlarged, particularly the right atrium and right ventricle. Small amount of pericardial fluid and/or thickening, unlikely to be of any hemodynamic significance at this time. No associated pericardial calcification. There is atherosclerosis of the thoracic aorta, the great vessels of the mediastinum and the coronary arteries, including calcified atherosclerotic plaque in the left anterior descending, left circumflex and right coronary arteries. Mild dilatation of the pulmonic trunk (3.4 cm in diameter), compatible with the reported clinical history of pulmonary arterial hypertension. No pathologically enlarged mediastinal or hilar lymph nodes. Please note that accurate exclusion of hilar adenopathy is limited on noncontrast CT scans. Esophagus is unremarkable in appearance. No axillary lymphadenopathy.  Lungs/Pleura: Small right pleural effusion lying dependently. Small amount of airspace consolidation in the anterior aspect of the right lower lobe where there are some air bronchograms. No suspicious appearing pulmonary nodules or masses. High-resolution images demonstrate no significant regions of ground-glass attenuation, subpleural reticulation, parenchymal banding or frank honeycombing. There is some mild cylindrical bronchiectasis noted in the right lower lobe. Inspiratory and expiratory imaging is unremarkable.  Upper Abdomen: Status post cholecystectomy.  Musculoskeletal/Soft Tissues: Old healed fracture of the mid sternum with  mild posttraumatic deformity, unchanged. Chronic compression fracture of T12 with approximately 20% loss of anterior vertebral body height also unchanged.  IMPRESSION: 1. No findings to suggest interstitial lung disease at this time. 2. Small amount of airspace consolidation with air bronchograms in the right lower lobe, suggestive of pneumonia. There is also some very mild cylindrical bronchiectasis in the right lower lobe, and a small right-sided parapneumonic pleural effusion. 3. Atherosclerosis, including three vessel coronary artery disease. Assessment for potential risk factor modification, dietary therapy or pharmacologic therapy may be warranted, if clinically indicated. 4. Cardiomegaly with right ventricular and right atrial dilatation, as well as mild dilatation of the pulmonic trunk; imaging findings compatible with the reported clinical history of pulmonary arterial hypertension.   Electronically Signed   By: Vinnie Langton M.D.   On: 02/22/2015 09:35   Nm Myocar Multi W/spect W/wall Motion / Ef  02/12/2015   CLINICAL DATA:  Ms. Eliberto Ivory is a 81 year old female who is admitted to the hospital with episodes of chest pain. She was scheduled for a [place] study for further evaluation.  EXAM: MYOCARDIAL IMAGING WITH SPECT (REST AND PHARMACOLOGIC-STRESS)  GATED LEFT VENTRICULAR WALL MOTION STUDY  LEFT VENTRICULAR EJECTION FRACTION  TECHNIQUE: Standard myocardial SPECT imaging was performed after resting intravenous injection of 10 mCi Tc-13m sestamibi. Subsequently, intravenous infusion of Lexiscan was performed under the supervision of the Cardiology staff. At peak effect of the drug, 30 mCi Tc-53m sestamibi was injected intravenously and standard myocardial SPECT imaging was performed. Quantitative gated imaging was also performed to evaluate left ventricular wall motion, and estimate left ventricular ejection fraction.  COMPARISON:  None.  FINDINGS: Review of the EKG tracings reveals no  significant ST or T wave changes.  Raw data images:  There is no significant motion artifact.  Stress images: There is smooth and homogeneous uptake in all areas of the myocardium.  Resting images: Smooth and homogeneous uptake in all areas of the myocardium. There is no evidence of ischemia or infarction.  Wall Motion:  The left ventricle systolic function is normal.  Left Ventricular Ejection Fraction: 57 %  End diastolic volume 38 ml  End systolic volume 16 ml  IMPRESSION: 1.  Normal Lexiscan Myoview study.  2.  The left ventricle systolic function is normal  3. Left ventricular ejection fraction 57%  *2012 Appropriate Use Criteria for Coronary Revascularization Focused Update: J Am Coll Cardiol. 7858;85(0):277-412. http://content.airportbarriers.com.aspx?articleid=1201161  Mertie Moores, MD, [facility]   Electronically Signed   By: Mertie Moores   On: 02/12/2015 16:45   Dg Chest Port 1 View  02/10/2015   CLINICAL DATA:  Acute onset of weakness. History of right ventricular failure and hypertension.  EXAM: PORTABLE CHEST - 1 VIEW  COMPARISON:  05/30/2014  FINDINGS: The heart is mildly enlarged. There is tortuosity and mild ectasia of the thoracic aorta. There are chronic emphysematous and bronchitic lung changes but no acute overlying pulmonary process. No pleural effusion. The bony thorax is intact.  IMPRESSION: Mild cardiac enlargement.  Chronic lung changes but no acute overlying pulmonary process.   Electronically Signed   By: Marijo Sanes M.D.   On: 02/10/2015 13:25    ECG  Atrial tachycardia. HR 124 bpm    ASSESSMENT AND PLAN  Principal Problem:   Chest pain Active Problems:   Dyslipidemia   Essential hypertension   PSVT with SSS   SPINAL STENOSIS, LUMBAR   Pulmonary hypertension, moderate to severe   Acute right ventricular heart failure   Acute cor pulmonale   History of bilat PE after TKR (off Coumadin Dec 2015)   Acute on chronic respiratory failure with hypoxia   Physical  deconditioning    1. Atrial Tachycardia w/ SSS: high burden of atrial tachycardia. More frequent/ prolonged episodes. Current rate in the 120s. Symptomatic with fatigue. Unable to tolerate high doses of BB therapy due to bradycardia with rates in the 40s. Currently on 25 mg of Lopressor BID.  Pt had refused SVT ablation in the past but, after further discussion, she is now open to this approach. Dr. Lovena Le to see later today to further discuss catheter ablation. Will also consider other pharmaceutical antiarrythmic therapies.    Signed, Lyda Jester, PA-C 02/26/2015, 11:57 AM   EP Attending  Patient seen and examined. Agree with history, exam, data and findings as noted above. The patient has had recurrent SVT which is symptomatic and poorly tolerated. She is not a good candidate for AA drug therapy and has already had profoundly prolonged QT interval with azithromycin. I have discussed the treatment options with the patient and the risks/benefits of catheter ablation reviewed and she wishes to consider her options. Will recheck tomorrow.  Mikle Bosworth.D.

## 2015-02-26 NOTE — Progress Notes (Addendum)
[facility] Progress Note   [name] JJH:[id number] DOB: [birth year] DOA: 02/25/2015 PCP: Loura Pardon, MD  Brief narrative: [name] is a 81 y.o. female with past medical history of PSVT, HTN, DVT, PE,  pulm HTN (mod to severe), right heart failure, OSA on CPAP presents with chest pain and was found to have A-fib with HR of 122 She was admitted 2 weeks ago with similar complaints and was found to have a severely prolonged QT interval. She underwent a stress test which was negative. She was treated for community-acquired pneumonia and discharged on 4/21 on oral antibiotics. She continued to have some symptoms of DOE and recent high resolution CT of the chest was performed on 4/28 which revealed air bronchograms in the right lower lobe suggestive of pneumonia with some mild bronchiectasis in the right lower lobe. Also noted was cardiomegaly with RV and RA dilated patient. She has had to increase her O2 use at home. She was previously only using it at night but has had been using it during the daytime at home for the past 2 month.  She overall feels weak and tired all the time and this is a great source of anxiety for her. Repeat chest x-ray on admission reveals continued bronchitic changes. The patient does not complain of cough. She has had no fever and no leukocytosis to suggest that she has a pneumonia at this time.   Subjective: No complaints of dyspnea, chest pain, palpitations, nausea. Cannot tell if there is dyspnea or chest pain on exertion as she has not ambulated since being in the hospital. Cannot recall what brought her to the hospital and states her children made her come in.   Assessment/Plan: Principal Problem: SVT/Atrial fibrillation/mildly elevated troponin -May be the cause of the vague chest pain she presented to the hospital with-cardiology is evaluating her and an EP consult has been requested -left and right heart cath planned for Wednesday -on apixaban- nitrate  added by cardiology  Active Problems: Pulmonary hypertension (moderate), right heart failure- chronic -may be in relation to obstructive sleep apnea,PEs in the past and maybe also obesity hypoventilation syndrome -last echo revealed severely dilated RV moderately to severely reduced function and moderate TR. PA peak pressure was 57 -left and right heart cath planned for Wednesday -continue low-dose furosemide - Isordil added by cardiology today at 10 mg 3 times a day    Acute on chronic respiratory failure with hypoxia -etiology for hypoxia not fully determined-recent high-resolution CT negative for etiology-may be in relation to pulmonary hypertension versus obesity hypoventilation syndrome -2 L of oxygen is keeping her pulse ox at 94%  Mild AKI on CKD3 -Baseline creatinine is about 0.9 to 1.0 -creatinine was slightly elevated at 1.12 on admission but has improved to 1.07    Essential hypertension -continue metoprolol    History of bilat 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 -Poor short-term memory, poor historian   Code Status: DNR Family Communication:  Disposition Plan: Wednesday DVT prophylaxis: on apixaban which was held today for cath-place SCDs and start low-dose Lovenox Consultants:cardiology Procedures:  Antibiotics: Anti-infectives    None      Objective: Filed Weights   02/25/15 0544 02/25/15 1154  Weight: 99.791 kg (220 lb) 100.1 kg (220 lb 10.9 oz)    Intake/Output Summary (Last 24 hours) at 02/26/15 1230 Last data filed at 02/26/15 0730  Gross  per 24 hour  Intake    640 ml  Output      0 ml  Net    640 ml     Vitals Filed Vitals:   02/25/15 2028 02/26/15 0031 02/26/15 0420 02/26/15 0638  BP: 107/75 129/73  132/79  Pulse: 124 70 79   Temp: 98.3 F (36.8 C) 98.7 F (37.1 C) 98.3 F (36.8 C)   TempSrc: Oral Oral Oral   Resp: 21 20 20    Height:       Weight:      SpO2: 94% 96% 94%     Exam:  General:  Pt is alert, not in acute distress  HEENT: No icterus, No thrush  Cardiovascular: regular rate and rhythm, S1/S2 No murmur- tachycardic  Respiratory: clear to auscultation bilaterally   Abdomen: Soft, +Bowel sounds, non tender, non distended, no guarding  MSK: No LE edema, cyanosis or clubbing  Data Reviewed: Basic Metabolic Panel:  Recent Labs Lab 02/19/15 1527 02/25/15 0559 02/26/15 0836  NA 135 137 138  K 4.5 4.1 4.5  CL 101 94* 93*  CO2 33* 35* 38*  GLUCOSE 109* 112* 120*  BUN 33* 19 19  CREATININE 0.98 1.12* 1.07*  CALCIUM 9.6 9.3 8.9   Liver Function Tests: No results for input(s): AST, ALT, ALKPHOS, BILITOT, PROT, ALBUMIN in the last 168 hours. No results for input(s): LIPASE, AMYLASE in the last 168 hours. No results for input(s): AMMONIA in the last 168 hours. CBC:  Recent Labs Lab 02/19/15 1527 02/25/15 0559  WBC 6.8 6.2  NEUTROABS  --  4.3  HGB 12.7 12.7  HCT 39.0 42.9  MCV 91.6 98.8  PLT 186.0 153   Cardiac Enzymes:  Recent Labs Lab 02/25/15 0827 02/25/15 1443 02/25/15 2021  TROPONINI 0.04* 0.03 0.04*   BNP (last 3 results) No results for input(s): BNP in the last 8760 hours.  ProBNP (last 3 results)  Recent Labs  02/19/15 1527  PROBNP 1306.0*    CBG: No results for input(s): GLUCAP in the last 168 hours.  No results found for this or any previous visit (from the past 240 hour(s)).   Studies: Dg Chest 2 View  02/25/2015   CLINICAL DATA:  Chest pain and shortness of breath.  EXAM: CHEST  2 VIEW  COMPARISON:  Chest radiograph February 10, 2015 and CT of the chest February 22, 2015  FINDINGS: The cardiac silhouette is moderately enlarged, unchanged. Tortuous aorta. Small to moderate RIGHT pleural effusion was present previously. Similar bronchitic changes. Minimal biapical pleural thickening. No pneumothorax. Soft tissue planes and included osseous structure nonsuspicious.   IMPRESSION: Stable cardiomegaly. Similar bronchitic changes with small to moderate RIGHT pleural effusion. Recommend followup chest radiograph after treatment to verify improvement.   Electronically Signed   By: Elon Alas   On: 02/25/2015 06:51    Scheduled Meds:  Scheduled Meds: . feeding supplement (RESOURCE BREEZE)  1 Container Oral TID BM  . furosemide  20 mg Oral Daily  . isosorbide dinitrate  10 mg Oral TID  . metoprolol  25 mg Oral BID  . multivitamin with minerals  1 tablet Oral Daily  . polyethylene glycol  8.5 g Oral Daily  . potassium chloride  40 mEq Oral Daily   Continuous Infusions:   Time spent on care of this patient: 35 minutes   [place], MD 02/26/2015, 12:30 PM  LOS: 1 day   [facility] Office  [phone] Pager - Text Page per [email or web address]  If 7PM-7AM, please  contact night-coverage [email or web address]

## 2015-02-26 NOTE — Progress Notes (Signed)
Physical medicine rehabilitation consult requested chart reviewed. Patient presented with chest pain findings of multiple episodes of supraventricular tachycardia. [employer] Medicare will not approve this patient for inpatient rehabilitation services at this time for this diagnosis. Physical occupational therapy evaluations are pending. Hold on formal rehabilitation consult at this time.

## 2015-02-26 NOTE — Evaluation (Signed)
Occupational Therapy Evaluation Patient Details Name: [name] MRN: [id number] DOB: December 18, 1933 Today's Date: 02/26/2015    History of Present Illness Pt is a 81 y/o female with multiple medical problems who was recently discharged on 4/21 after admission for PNA. Since d/c pt has not improved back to baseline of function. Pt presents back to the ED with SOB and chest pain.   Clinical Impression   Pt admitted with above. Feel pt will benefit from acute OT to increase independence, strength, and activity tolerance prior to d/c. Recommending SNF for rehab.    Follow Up Recommendations  SNF    Equipment Recommendations  Other (comment) (defer to next venue)    Recommendations for Other Services       Precautions / Restrictions Precautions Precautions: Fall Restrictions Weight Bearing Restrictions: No      Mobility Bed Mobility Overal bed mobility: Needs Assistance Bed Mobility: Sit to Supine    Sit to supine: Min assist   General bed mobility comments: [place] position used to scoot HOB and cues given for technique. Assist to get LE onto bed and to adjust in bed.   Transfers Overall transfer level: Needs assistance Equipment used: Rolling walker (2 wheeled) Transfers: Sit to/from Stand Sit to Stand: Max assist       General transfer comment: heavy assist from chair. cues for technique.    Balance Pt with LOB while standing and OT assisting with hygiene requiring Mod A for balance.                             ADL Overall ADL's : Needs assistance/impaired     Grooming: Set up;Sitting           Upper Body Dressing : Sitting;Set up   Lower Body Dressing: Minimal assistance;Sit to/from stand   Toilet Transfer: Min guard;Ambulation;RW;Maximal assistance (chair/bed; Max assist for sit to stand from chair)   [place] and Hygiene: Sit to/from stand;Moderate assistance (see ADL section)       Functional mobility  during ADLs: Min guard;Rolling walker General ADL Comments: Educated on energy conservation. Heavy assist to stand from chair. Pt incontinent of bowel and started having diarrhea in session-nurse notified. Pt losing balance while OT performing toilet hygiene as pt was incontinent of BM and was going in chair, but earlier pt was able to perform hygiene (OT only assisted with cleaning more thoroughly) and pulled up her own underwear.     Vision     Perception     Praxis      Pertinent Vitals/Pain Pain Assessment: 0-10 Pain Score:  (3-4) Pain Location: chest Pain Descriptors / Indicators: Sore Pain Intervention(s): Monitored during session     Hand Dominance Right   Extremity/Trunk Assessment Upper Extremity Assessment Upper Extremity Assessment: Generalized weakness   Lower Extremity Assessment Lower Extremity Assessment: Defer to PT evaluation     Communication Communication Communication: No difficulties   Cognition Arousal/Alertness: Awake/alert Behavior During Therapy: WFL for tasks assessed/performed Overall Cognitive Status: Within Functional Limits for tasks assessed                 General Comments       Exercises       Shoulder Instructions      Home Living Family/patient expects to be discharged to:: Private residence Living Arrangements: Children Available Help at Discharge: Family;Available PRN/intermittently Type of Home: House Home Access: Ramped entrance;Stairs to enter Entrance Stairs-Number of Steps:  1 step at the end of the ramp to enter   Home Layout: One level               Home Equipment: [place] - 2 wheels;Cane - single point          Prior Functioning/Environment Level of Independence: Independent with assistive device(s)-believe this is prior to getting sick recently       Comments: Using the cane prior to getting sick, using the RW for the past 3 weeks    OT Diagnosis: Generalized weakness;Acute pain   OT Problem  List: Decreased strength;Decreased activity tolerance;Impaired balance (sitting and/or standing);Decreased knowledge of use of DME or AE;Decreased knowledge of precautions;Pain;Increased edema;Cardiopulmonary status limiting activity   OT Treatment/Interventions: Self-care/ADL training;Therapeutic exercise;Energy conservation;DME and/or AE instruction;Therapeutic activities;Patient/family education;Balance training    OT Goals(Current goals can be found in the care plan section) Acute Rehab OT Goals Patient Stated Goal: not stated OT Goal Formulation: With patient Time For Goal Achievement: 03/05/15 Potential to Achieve Goals: Good ADL Goals Pt Will Perform Grooming: with set-up;standing Pt Will Perform Lower Body Bathing: with set-up;with supervision;sit to/from stand Pt Will Perform Lower Body Dressing: with supervision;with set-up;sit to/from stand Pt Will Transfer to Toilet: with supervision;ambulating Pt Will Perform Toileting - Clothing Manipulation and hygiene: with supervision;sit to/from stand  OT Frequency: Min 2X/week   Barriers to D/C:            Co-evaluation              End of Session Equipment Utilized During Treatment: Gait belt;Rolling walker;Oxygen Nurse Communication: Other (comment) (pt with diarrhea)  Activity Tolerance: Patient limited by fatigue Patient left: in bed;with call bell/phone within reach   Time: 5056-9794 OT Time Calculation (min): 22 min Charges:  OT General Charges $OT Visit: 1 Procedure OT Evaluation $Initial OT Evaluation Tier I: 1 Procedure G-CodesBenito Mccreedy OTR/L [id number] 02/26/2015, 2:18 PM

## 2015-02-26 NOTE — Evaluation (Signed)
Physical Therapy Evaluation Patient Details Name: [name] MRN: [id number] DOB: [birth year] Today's Date: 02/26/2015   History of Present Illness  Pt is a 81 y/o female with multiple medical problems who was recently discharged on 4/21 after admission for PNA. Since d/c pt has not improved back to baseline of function. Pt presents back to the ED with SOB and CP.   Clinical Impression  Pt admitted with above diagnosis. Pt currently with functional limitations due to the deficits listed below (see PT Problem List). At the time of PT eval pt was able to perform transfers and ambulation with min guard assist for balance and safety. Pt will benefit from skilled PT to increase their independence and safety with mobility to allow discharge to the venue listed below. Pt/family agreeable to SNF at d/c and prefers either [facility] or [facility]     Follow Up Recommendations SNF;Supervision/Assistance - 24 hour    Equipment Recommendations  None recommended by PT    Recommendations for Other Services       Precautions / Restrictions Precautions Precautions: Fall Restrictions Weight Bearing Restrictions: No      Mobility  Bed Mobility Overal bed mobility: Needs Assistance Bed Mobility: Supine to Sit     Supine to sit: Min guard     General bed mobility comments: Guarding for trunk as pt transitioned to full sitting position.   Transfers Overall transfer level: Needs assistance Equipment used: Rolling walker (2 wheeled) Transfers: Sit to/from [facility] Sit to Stand: Min assist Stand pivot transfers: Min guard       General transfer comment: Pt was able to power-up to full standing position with assist for balance/support. Was able to slowly take pivotal steps around to the [facility] with close guard for safety.   Ambulation/Gait Ambulation/Gait assistance: Min guard Ambulation Distance (Feet): 15 Feet Assistive device: Rolling walker (2 wheeled) Gait  Pattern/deviations: Step-through pattern;Decreased stride length;Trunk flexed Gait velocity: Decreased Gait velocity interpretation: Below normal speed for age/gender General Gait Details: Pt generally moving slow and taking time to look out the windows. Min guard for safety.   Stairs            Wheelchair Mobility    Modified Rankin (Stroke Patients Only)       Balance Overall balance assessment: Needs assistance Sitting-balance support: Feet supported;No upper extremity supported Sitting balance-Leahy Scale: Fair     Standing balance support: Bilateral upper extremity supported Standing balance-Leahy Scale: Poor                               Pertinent Vitals/Pain Pain Assessment: No/denies pain    Home Living Family/patient expects to be discharged to:: Private residence Living Arrangements: Children Available Help at Discharge: Family;Available PRN/intermittently Type of Home: House Home Access: Ramped entrance;Stairs to enter   Entrance Stairs-Number of Steps: 1 step at the end of the ramp to enter Home Layout: One level Home Equipment: Walker - 2 wheels;Cane - single point      Prior Function Level of Independence: Independent with assistive device(s)         Comments: Using the cane prior to getting sick, using the RW for the past 3 weeks     Hand Dominance   Dominant Hand: Right    Extremity/Trunk Assessment   Upper Extremity Assessment: Generalized weakness           Lower Extremity Assessment: Generalized weakness (B knee replacements )  Cervical / Trunk Assessment: Kyphotic  Communication   Communication: No difficulties  Cognition Arousal/Alertness: Awake/alert Behavior During Therapy: WFL for tasks assessed/performed Overall Cognitive Status: Impaired/Different from baseline Area of Impairment: Orientation Orientation Level: Disoriented to;Time                  General Comments      Exercises         Assessment/Plan    PT Assessment Patient needs continued PT services  PT Diagnosis Difficulty walking;Generalized weakness   PT Problem List Decreased strength;Decreased range of motion;Decreased balance;Decreased activity tolerance;Decreased safety awareness;Decreased knowledge of use of DME;Decreased knowledge of precautions;Cardiopulmonary status limiting activity  PT Treatment Interventions DME instruction;Gait training;Stair training;Functional mobility training;Therapeutic activities;Therapeutic exercise;Neuromuscular re-education;Patient/family education   PT Goals (Current goals can be found in the Care Plan section) Acute Rehab PT Goals Patient Stated Goal: Return home after SNF PT Goal Formulation: With patient/family Time For Goal Achievement: 03/12/15 Potential to Achieve Goals: Good    Frequency Min 2X/week   Barriers to discharge Decreased caregiver support Pt has family in and out to check on her but no 24 hour assistance that will be consistent    Co-evaluation               End of Session Equipment Utilized During Treatment: Oxygen Activity Tolerance: Patient tolerated treatment well Patient left: in chair;with call bell/phone within reach Nurse Communication: Mobility status         Time: 4193-7902 PT Time Calculation (min) (ACUTE ONLY): 32 min   Charges:   PT Evaluation $Initial PT Evaluation Tier I: 1 Procedure PT Treatments $Therapeutic Activity: 8-22 mins   PT G Codes:        Rolinda Roan 03-08-15, 12:48 PM   Rolinda Roan, PT, DPT Acute Rehabilitation Services Pager: [phone]

## 2015-02-26 NOTE — Progress Notes (Signed)
Patient ID: [name], female   DOB: 14-Jul-1934, 81 y.o.   MRN: [id number]    Subjective:  Denies SSCP, palpitations or Dyspnea Some dementia Spoke with daughter   Objective:  Filed Vitals:   02/25/15 2028 02/26/15 0031 02/26/15 0420 02/26/15 0638  BP: 107/75 129/73  132/79  Pulse: 124 70 79   Temp: 98.3 F (36.8 C) 98.7 F (37.1 C) 98.3 F (36.8 C)   TempSrc: Oral Oral Oral   Resp: 21 20 20    Height:      Weight:      SpO2: 94% 96% 94%     Intake/Output from previous day:  Intake/Output Summary (Last 24 hours) at 02/26/15 0956 Last data filed at 02/26/15 0730  Gross per 24 hour  Intake    640 ml  Output      0 ml  Net    640 ml    Physical Exam: Affect appropriate Obese white female  HEENT: normal Neck supple with no adenopathy JVP normal no bruits no thyromegaly Lungs clear with no wheezing and good diaphragmatic motion Heart:  S1/S2 no murmur, no rub, gallop or click PMI normal Abdomen: benighn, BS positve, no tenderness, no AAA no bruit.  No HSM or HJR Distal pulses intact with no bruits No edema Neuro non-focal Skin warm and dry No muscular weakness   Lab Results: Basic Metabolic Panel:  Recent Labs  02/25/15 0559 02/26/15 0836  NA 137 138  K 4.1 4.5  CL 94* 93*  CO2 35* 38*  GLUCOSE 112* 120*  BUN 19 19  CREATININE 1.12* 1.07*  CALCIUM 9.3 8.9   CBC:  Recent Labs  02/25/15 0559  WBC 6.2  NEUTROABS 4.3  HGB 12.7  HCT 42.9  MCV 98.8  PLT 153   Cardiac Enzymes:  Recent Labs  02/25/15 0827 02/25/15 1443 02/25/15 2021  TROPONINI 0.04* 0.03 0.04*    Imaging: Dg Chest 2 View  02/25/2015   CLINICAL DATA:  Chest pain and shortness of breath.  EXAM: CHEST  2 VIEW  COMPARISON:  Chest radiograph February 10, 2015 and CT of the chest February 22, 2015  FINDINGS: The cardiac silhouette is moderately enlarged, unchanged. Tortuous aorta. Small to moderate RIGHT pleural effusion was present previously. Similar bronchitic changes. Minimal  biapical pleural thickening. No pneumothorax. Soft tissue planes and included osseous structure nonsuspicious.  IMPRESSION: Stable cardiomegaly. Similar bronchitic changes with small to moderate RIGHT pleural effusion. Recommend followup chest radiograph after treatment to verify improvement.   Electronically Signed   By: [name]   On: 02/25/2015 06:51    Cardiac Studies:  ECG:  Atrial tachycardia low voltage    Telemetry:  Multiple episodes ? Atrial tachycardia rates 120-14  Echo:  02/12/15 Study Conclusions  - Left ventricle: Paradoxical septal motiom. The cavity size was normal. Wall thickness was increased in a pattern of mild LVH. The estimated ejection fraction was 55%. - Mitral valve: There was mild regurgitation. - Right ventricle: The cavity size was severely dilated. Systolic function was moderately to severely reduced. - Tricuspid valve: There was moderate regurgitation. - Pulmonary arteries: PA peak pressure: 57 mm Hg (S). - Pericardium, extracardiac: A small pericardial effusion was identified posterior to the heart.  Medications:   . apixaban  5 mg Oral BID  . feeding supplement (RESOURCE BREEZE)  1 Container Oral TID BM  . furosemide  20 mg Oral Daily  . metoprolol  25 mg Oral BID  . multivitamin with minerals  1  tablet Oral Daily  . polyethylene glycol  8.5 g Oral Daily  . potassium chloride  40 mEq Oral Daily       Assessment/Plan:  Chest Pain:  Normal myovue 02/12/15 recurrent   See consult note [name].  Will hold eliquis and arrange right and left heart cath wendsday.  Discussed with daughter PUlmonary HTN:  RV dysfunction on echo with PA systolic 57 mmHg  Right heart cath add nitrates Arrhythmia:  With hisotory of long QT ? Contributing to symptoms will ask EP to see ? Antiarrhythmic while in hospital ? Mapping and ablation likely right sided foci  [name] 02/26/2015, 9:56 AM

## 2015-02-27 ENCOUNTER — Other Ambulatory Visit: Payer: Medicare Other

## 2015-02-27 DIAGNOSIS — I509 Heart failure, unspecified: Secondary | ICD-10-CM | POA: Diagnosis not present

## 2015-02-27 DIAGNOSIS — R079 Chest pain, unspecified: Secondary | ICD-10-CM | POA: Diagnosis not present

## 2015-02-27 DIAGNOSIS — R0789 Other chest pain: Secondary | ICD-10-CM | POA: Diagnosis not present

## 2015-02-27 DIAGNOSIS — I471 Supraventricular tachycardia: Secondary | ICD-10-CM | POA: Diagnosis not present

## 2015-02-27 DIAGNOSIS — J9621 Acute and chronic respiratory failure with hypoxia: Secondary | ICD-10-CM | POA: Diagnosis not present

## 2015-02-27 NOTE — Clinical Social Work Placement (Signed)
   CLINICAL SOCIAL WORK PLACEMENT  NOTE  Date:  02/27/2015  Patient Details  Name: [name] MRN: [id number] Date of Birth: [birth year]  Clinical Social Work is seeking post-discharge placement for this patient at the [place] level of care (*CSW will initial, date and re-position this form in  chart as items are completed):  Yes   Patient/family provided with [place] Work Department's list of facilities offering this level of care within the geographic area requested by the patient (or if unable, by the patient's family).  Yes   Patient/family informed of their freedom to choose among providers that offer the needed level of care, that participate in Medicare, Medicaid or managed care program needed by the patient, have an available bed and are willing to accept the patient.  Yes   Patient/family informed of [facility]'s ownership interest in [facility] and [facility], as well as of the fact that they are under no obligation to receive care at these facilities.  PASRR submitted to EDS on       PASRR number received on       Existing PASRR number confirmed on 02/27/15     FL2 transmitted to all facilities in geographic area requested by pt/family on       FL2 transmitted to all facilities within larger geographic area on 02/27/15     Patient informed that his/her managed care company has contracts with or will negotiate with certain facilities, including the following:            Patient/family informed of bed offers received.  Patient chooses bed at       Physician recommends and patient chooses bed at      Patient to be transferred to   on  .  Patient to be transferred to facility by       Patient family notified on   of transfer.  Name of family member notified:        PHYSICIAN       Additional Comment:    _______________________________________________ Rigoberto Noel, LCSW 02/27/2015, 5:17 PM

## 2015-02-27 NOTE — Progress Notes (Signed)
[name] Date of Encounter: 02/27/2015     Principal Problem:   Chest pain Active Problems:   Dyslipidemia   Essential hypertension   PSVT with SSS   SPINAL STENOSIS, LUMBAR   Pulmonary hypertension, moderate to severe   Acute right ventricular heart failure   Acute cor pulmonale   History of bilat PE after TKR (off Coumadin Dec 2015)   Acute on chronic respiratory failure with hypoxia   Physical deconditioning    SUBJECTIVE  No complaints this AM. She does feel the palpitations but is "used to it." Her dyspnea is the same.   CURRENT MEDS . enoxaparin (LOVENOX) injection  50 mg Subcutaneous Q24H  . feeding supplement (RESOURCE BREEZE)  1 Container Oral TID BM  . furosemide  20 mg Oral Daily  . isosorbide dinitrate  10 mg Oral TID  . metoprolol  25 mg Oral BID  . multivitamin with minerals  1 tablet Oral Daily  . polyethylene glycol  8.5 g Oral Daily  . potassium chloride  40 mEq Oral Daily    OBJECTIVE  Filed Vitals:   02/27/15 0135 02/27/15 0202 02/27/15 0500 02/27/15 0957  BP: 122/64  112/85 101/75  Pulse: 69 71 60 118  Temp:    97.8 F (36.6 C)  TempSrc:    Oral  Resp: 21 16  20   Height:      Weight:      SpO2: 95% 94% 98% 95%    Intake/Output Summary (Last 24 hours) at 02/27/15 1220 Last data filed at 02/27/15 0921  Gross per 24 hour  Intake    820 ml  Output      0 ml  Net    820 ml   Filed Weights   02/25/15 0544 02/25/15 1154  Weight: 220 lb (99.791 kg) 220 lb 10.9 oz (100.1 kg)    PHYSICAL EXAM  General: Pleasant, NAD. Seen comfortable eating lunch.  Neuro: Alert and oriented X 3. Moves all extremities spontaneously. Psych: Normal affect. HEENT:  Normal  Neck: Supple without bruits or JVD. Lungs:  Resp regular and unlabored, CTA. Heart: RRR no s3, s4, or murmurs. Abdomen: Soft, non-tender, non-distended, BS + x 4.  Extremities: No clubbing, cyanosis or edema. DP/PT/Radials 2+ and equal bilaterally.  Accessory  Clinical Findings  CBC  Recent Labs  02/25/15 0559  WBC 6.2  NEUTROABS 4.3  HGB 12.7  HCT 42.9  MCV 98.8  PLT 338   Basic Metabolic Panel  Recent Labs  02/25/15 0559 02/26/15 0836  NA 137 138  K 4.1 4.5  CL 94* 93*  CO2 35* 38*  GLUCOSE 112* 120*  BUN 19 19  CREATININE 1.12* 1.07*  CALCIUM 9.3 8.9  Cardiac Enzymes  Recent Labs  02/25/15 0827 02/25/15 1443 02/25/15 2021  TROPONINI 0.04* 0.03 0.04*    TELE  Currently NSR in 60s. Freq bursts of SVT this AM. HRs in 120s during these episodes.   Radiology/Studies  Dg Chest 2 View  02/25/2015   CLINICAL DATA:  Chest pain and shortness of breath.  EXAM: CHEST  2 VIEW  COMPARISON:  Chest radiograph February 10, 2015 and CT of the chest February 22, 2015  FINDINGS: The cardiac silhouette is moderately enlarged, unchanged. Tortuous aorta. Small to moderate RIGHT pleural effusion was present previously. Similar bronchitic changes. Minimal biapical pleural thickening. No pneumothorax. Soft tissue planes and included osseous structure nonsuspicious.  IMPRESSION: Stable cardiomegaly. Similar bronchitic changes with small to moderate RIGHT pleural effusion.  Recommend followup chest radiograph after treatment to verify improvement.   Electronically Signed   By: Elon Alas   On: 02/25/2015 06:51   Ct Chest High Resolution  02/22/2015   CLINICAL DATA:  81 year old female with shortness breath on exertion. History of pulmonary hypertension. Cough. Evaluate for interstitial lung disease.  EXAM: CT CHEST WITHOUT CONTRAST  TECHNIQUE: Multidetector CT imaging of the chest was performed following the standard protocol without intravenous contrast. High resolution imaging of the lungs, as well as inspiratory and expiratory imaging, was performed.  COMPARISON:  Chest CT 03/13/2014.  FINDINGS: Mediastinum/Lymph Nodes: Heart size is mildly enlarged, particularly the right atrium and right ventricle. Small amount of pericardial fluid and/or  thickening, unlikely to be of any hemodynamic significance at this time. No associated pericardial calcification. There is atherosclerosis of the thoracic aorta, the great vessels of the mediastinum and the coronary arteries, including calcified atherosclerotic plaque in the left anterior descending, left circumflex and right coronary arteries. Mild dilatation of the pulmonic trunk (3.4 cm in diameter), compatible with the reported clinical history of pulmonary arterial hypertension. No pathologically enlarged mediastinal or hilar lymph nodes. Please note that accurate exclusion of hilar adenopathy is limited on noncontrast CT scans. Esophagus is unremarkable in appearance. No axillary lymphadenopathy.  Lungs/Pleura: Small right pleural effusion lying dependently. Small amount of airspace consolidation in the anterior aspect of the right lower lobe where there are some air bronchograms. No suspicious appearing pulmonary nodules or masses. High-resolution images demonstrate no significant regions of ground-glass attenuation, subpleural reticulation, parenchymal banding or frank honeycombing. There is some mild cylindrical bronchiectasis noted in the right lower lobe. Inspiratory and expiratory imaging is unremarkable.  Upper Abdomen: Status post cholecystectomy.  Musculoskeletal/Soft Tissues: Old healed fracture of the mid sternum with mild posttraumatic deformity, unchanged. Chronic compression fracture of T12 with approximately 20% loss of anterior vertebral body height also unchanged.  IMPRESSION: 1. No findings to suggest interstitial lung disease at this time. 2. Small amount of airspace consolidation with air bronchograms in the right lower lobe, suggestive of pneumonia. There is also some very mild cylindrical bronchiectasis in the right lower lobe, and a small right-sided parapneumonic pleural effusion. 3. Atherosclerosis, including three vessel coronary artery disease. Assessment for potential risk factor  modification, dietary therapy or pharmacologic therapy may be warranted, if clinically indicated. 4. Cardiomegaly with right ventricular and right atrial dilatation, as well as mild dilatation of the pulmonic trunk; imaging findings compatible with the reported clinical history of pulmonary arterial hypertension.   Electronically Signed   By: Vinnie Langton M.D.   On: 02/22/2015 09:35   Nm Myocar Multi W/spect W/wall Motion / Ef  02/12/2015   CLINICAL DATA:  Ms. Eliberto Ivory is a 81 year old female who is admitted to the hospital with episodes of chest pain. She was scheduled for a [place] study for further evaluation.  EXAM: MYOCARDIAL IMAGING WITH SPECT (REST AND PHARMACOLOGIC-STRESS)  GATED LEFT VENTRICULAR WALL MOTION STUDY  LEFT VENTRICULAR EJECTION FRACTION  TECHNIQUE: Standard myocardial SPECT imaging was performed after resting intravenous injection of 10 mCi Tc-42m sestamibi. Subsequently, intravenous infusion of Lexiscan was performed under the supervision of the Cardiology staff. At peak effect of the drug, 30 mCi Tc-28m sestamibi was injected intravenously and standard myocardial SPECT imaging was performed. Quantitative gated imaging was also performed to evaluate left ventricular wall motion, and estimate left ventricular ejection fraction.  COMPARISON:  None.  FINDINGS: Review of the EKG tracings reveals no significant ST or T wave changes.  Raw data images:  There is no significant motion artifact.  Stress images: There is smooth and homogeneous uptake in all areas of the myocardium.  Resting images: Smooth and homogeneous uptake in all areas of the myocardium. There is no evidence of ischemia or infarction.  Wall Motion:  The left ventricle systolic function is normal.  Left Ventricular Ejection Fraction: 57 %  End diastolic volume 38 ml  End systolic volume 16 ml  IMPRESSION: 1.  Normal Lexiscan Myoview study.  2.  The left ventricle systolic function is normal  3. Left ventricular ejection  fraction 57%  *2012 Appropriate Use Criteria for Coronary Revascularization Focused Update: J Am Coll Cardiol. 2233;61(2):244-975. http://content.airportbarriers.com.aspx?articleid=1201161  Mertie Moores, MD, [facility]   Electronically Signed   By: Mertie Moores   On: 02/12/2015 16:45   Dg Chest Port 1 View  02/10/2015   CLINICAL DATA:  Acute onset of weakness. History of right ventricular failure and hypertension.  EXAM: PORTABLE CHEST - 1 VIEW  COMPARISON:  05/30/2014  FINDINGS: The heart is mildly enlarged. There is tortuosity and mild ectasia of the thoracic aorta. There are chronic emphysematous and bronchitic lung changes but no acute overlying pulmonary process. No pleural effusion. The bony thorax is intact.  IMPRESSION: Mild cardiac enlargement.  Chronic lung changes but no acute overlying pulmonary process.   Electronically Signed   By: Marijo Sanes M.D.   On: 02/10/2015 13:25    ASSESSMENT AND PLAN  81 y/o female with a past medical history significant for PE, pulmonary HTN, right sided HF, OSA, obesity HLD and recurrent paroxysmal atrial tachycardia who was admitted 02/25/15 with chest pain and dyspnea. Found to have extremely frequent bursts of paroxysmal atrial tachycardia all day, roughly 120 bpm, also with periods of bradycardia with rates in the 40s. EP has been consulted for recommendations regarding her recurrent/high burden SVT.   1. Atrial Tachycardia w/ SSS: high burden of atrial tachycardia. More frequent/ prolonged episodes. Currently in NSR in 70s. Very frequent runs this AM with rates in the 120s. Symptomatic with fatigue. Unable to tolerate high doses of BB therapy due to bradycardia with rates in the 40s. Currently on 25 mg of Lopressor BID. Pt had refused SVT ablation in the past but, after further discussion, she is now open to this approach. Dr. Lovena Le saw yesterday: "recurrent SVT which is symptomatic and poorly tolerated. She is not a good candidate for AA drug therapy and  has already had profoundly prolonged QT interval with azithromycin. He discussed the treatment options with the patient and the risks/benefits of catheter ablation but patient wanted to think this over."   * I spoke with patient this AM and she is willing to proceed with catheter ablation for symptomatic recurrent SVT. Dr Caryl Comes to follow and then we will make formal arrangements.  Chest pain/Pulm HTN- She has a R/LHC planned for 9 am tomorrow. General cardiology following for w/u for chest pain as well as re-evaluating the severity of her pulmonary HTN   Signed, Eileen Stanford PA-C  Pager [phone]

## 2015-02-27 NOTE — Progress Notes (Signed)
[facility] Progress Note   [name] QIW:[id number] DOB: October 10, 1934 DOA: 02/25/2015 PCP: Loura Pardon, MD  Brief narrative: [name] is a 81 y.o. female with past medical history of PSVT, HTN, DVT, PE,  pulm HTN (mod to severe), right heart failure, OSA on CPAP presents with chest pain and was found to have A-fib with HR of 122 She was admitted 2 weeks ago with similar complaints and was found to have a severely prolonged QT interval. She underwent a stress test which was negative. She was treated for community-acquired pneumonia and discharged on 4/21 on oral antibiotics. She continued to have some symptoms of DOE and recent high resolution CT of the chest was performed on 4/28 which revealed air bronchograms in the right lower lobe suggestive of pneumonia with some mild bronchiectasis in the right lower lobe. Also noted was cardiomegaly with RV and RA dilated patient. She has had to increase her O2 use at home. She was previously only using it at night but has had been using it during the daytime at home for the past 2 month.  She overall feels weak and tired all the time and this is a great source of anxiety for her. Repeat chest x-ray on admission reveals continued bronchitic changes. The patient does not complain of cough. She has had no fever and no leukocytosis to suggest that she has a pneumonia at this time.   Subjective: Having some pain in her back today. No c/o chest pain, dyspnea or palpitations.   Assessment/Plan: Principal Problem: SVT/Atrial fibrillation/mildly elevated troponin -May be the cause of the vague chest pain she presented to the hospital with -cardiology and EP are evaluating her -left and right heart cath planned for Wednesday -on apixaban- nitrate added by cardiology  Active Problems: Pulmonary hypertension (moderate), right heart failure- chronic -may be in relation to obstructive sleep apnea,PEs in the past and maybe also obesity hypoventilation  syndrome -last echo revealed severely dilated RV moderately to severely reduced function and moderate TR. PA peak pressure was 57 -left and right heart cath planned for Wednesday -continue low-dose furosemide - Isordil added by cardiology today at 10 mg 3 times a day    Acute on chronic respiratory failure with hypoxia -etiology for hypoxia not fully determined-recent high-resolution CT negative for etiology-may be in relation to pulmonary hypertension versus obesity hypoventilation syndrome -2 L of oxygen is keeping her pulse ox at 94- 98%  Mild AKI on CKD3 -Baseline creatinine is about 0.9 to 1.0 -creatinine was slightly elevated at 1.12 on admission but has improved to 1.07    Essential hypertension -continue metoprolol    History of bilat 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 -Poor short-term memory, poor historian   Code Status: DNR Family Communication:  Disposition Plan: Wednesday DVT prophylaxis: on apixaban which is on hold for cath-place SCDs and start low-dose Lovenox Consultants:cardiology Procedures:  Antibiotics: Anti-infectives    None      Objective: Filed Weights   02/25/15 0544 02/25/15 1154  Weight: 99.791 kg (220 lb) 100.1 kg (220 lb 10.9 oz)    Intake/Output Summary (Last 24 hours) at 02/27/15 1245 Last data filed at 02/27/15 0921  Gross per 24 hour  Intake    580 ml  Output      0 ml  Net    580 ml     Vitals Filed Vitals:  02/27/15 0135 02/27/15 0202 02/27/15 0500 02/27/15 0957  BP: 122/64  112/85 101/75  Pulse: 69 71 60 118  Temp:    97.8 F (36.6 C)  TempSrc:    Oral  Resp: 21 16  20   Height:      Weight:      SpO2: 95% 94% 98% 95%    Exam:  General:  Pt is alert, not in acute distress  HEENT: No icterus, No thrush  Cardiovascular: regular rate and rhythm, S1/S2 No murmur- tachycardic  Respiratory: clear to auscultation  bilaterally   Abdomen: Soft, +Bowel sounds, non tender, non distended, no guarding  MSK: No LE edema, cyanosis or clubbing  Data Reviewed: Basic Metabolic Panel:  Recent Labs Lab 02/25/15 0559 02/26/15 0836  NA 137 138  K 4.1 4.5  CL 94* 93*  CO2 35* 38*  GLUCOSE 112* 120*  BUN 19 19  CREATININE 1.12* 1.07*  CALCIUM 9.3 8.9   Liver Function Tests: No results for input(s): AST, ALT, ALKPHOS, BILITOT, PROT, ALBUMIN in the last 168 hours. No results for input(s): LIPASE, AMYLASE in the last 168 hours. No results for input(s): AMMONIA in the last 168 hours. CBC:  Recent Labs Lab 02/25/15 0559  WBC 6.2  NEUTROABS 4.3  HGB 12.7  HCT 42.9  MCV 98.8  PLT 153   Cardiac Enzymes:  Recent Labs Lab 02/25/15 0827 02/25/15 1443 02/25/15 2021  TROPONINI 0.04* 0.03 0.04*   BNP (last 3 results) No results for input(s): BNP in the last 8760 hours.  ProBNP (last 3 results)  Recent Labs  02/19/15 1527  PROBNP 1306.0*    CBG: No results for input(s): GLUCAP in the last 168 hours.  No results found for this or any previous visit (from the past 240 hour(s)).   Studies: No results found.  Scheduled Meds:  Scheduled Meds: . enoxaparin (LOVENOX) injection  50 mg Subcutaneous Q24H  . feeding supplement (RESOURCE BREEZE)  1 Container Oral TID BM  . furosemide  20 mg Oral Daily  . isosorbide dinitrate  10 mg Oral TID  . metoprolol  25 mg Oral BID  . multivitamin with minerals  1 tablet Oral Daily  . polyethylene glycol  8.5 g Oral Daily  . potassium chloride  40 mEq Oral Daily   Continuous Infusions:   Time spent on care of this patient: 35 minutes   [place], MD 02/27/2015, 12:45 PM  LOS: 2 days   [facility] Office  [phone] Pager - Text Page per [email or web address]  If 7PM-7AM, please contact night-coverage [email or web address]

## 2015-02-27 NOTE — Progress Notes (Signed)
Patient ID: [name], female   DOB: 1934/02/12, 81 y.o.   MRN: [id number]    Subjective:  Denies SSCP, palpitations or Dyspnea   Objective:  Filed Vitals:   02/27/15 0135 02/27/15 0202 02/27/15 0500 02/27/15 0957  BP: 122/64  112/85 101/75  Pulse: 69 71 60 118  Temp:    97.8 F (36.6 C)  TempSrc:    Oral  Resp: 21 16  20   Height:      Weight:      SpO2: 95% 94% 98% 95%    Intake/Output from previous day:  Intake/Output Summary (Last 24 hours) at 02/27/15 1311 Last data filed at 02/27/15 3267  Gross per 24 hour  Intake    580 ml  Output      0 ml  Net    580 ml    Physical Exam: Affect appropriate Obese white female  HEENT: normal Neck supple with no adenopathy JVP normal no bruits no thyromegaly Lungs clear with no wheezing and good diaphragmatic motion Heart:  S1/S2 no murmur, no rub, gallop or click PMI normal Abdomen: benighn, BS positve, no tenderness, no AAA no bruit.  No HSM or HJR Distal pulses intact with no bruits No edema Neuro non-focal Skin warm and dry No muscular weakness   Lab Results: Basic Metabolic Panel:  Recent Labs  02/25/15 0559 02/26/15 0836  NA 137 138  K 4.1 4.5  CL 94* 93*  CO2 35* 38*  GLUCOSE 112* 120*  BUN 19 19  CREATININE 1.12* 1.07*  CALCIUM 9.3 8.9   CBC:  Recent Labs  02/25/15 0559  WBC 6.2  NEUTROABS 4.3  HGB 12.7  HCT 42.9  MCV 98.8  PLT 153   Cardiac Enzymes:  Recent Labs  02/25/15 0827 02/25/15 1443 02/25/15 2021  TROPONINI 0.04* 0.03 0.04*    Imaging: No results found.  Cardiac Studies:  ECG:  Atrial tachycardia low voltage  02/27/2015    Telemetry:  Multiple episodes ? Atrial tachycardia rates 120-14 02/27/2015   Echo:  02/12/15 Study Conclusions  - Left ventricle: Paradoxical septal motiom. The cavity size was normal. Wall thickness was increased in a pattern of mild LVH. The estimated ejection fraction was 55%. - Mitral valve: There was mild regurgitation. - Right  ventricle: The cavity size was severely dilated. Systolic function was moderately to severely reduced. - Tricuspid valve: There was moderate regurgitation. - Pulmonary arteries: PA peak pressure: 57 mm Hg (S). - Pericardium, extracardiac: A small pericardial effusion was identified posterior to the heart.  Medications:   . enoxaparin (LOVENOX) injection  50 mg Subcutaneous Q24H  . feeding supplement (RESOURCE BREEZE)  1 Container Oral TID BM  . furosemide  20 mg Oral Daily  . isosorbide dinitrate  10 mg Oral TID  . metoprolol  25 mg Oral BID  . multivitamin with minerals  1 tablet Oral Daily  . polyethylene glycol  8.5 g Oral Daily  . potassium chloride  40 mEq Oral Daily       Assessment/Plan:  Chest Pain:  Normal myovue 02/12/15 recurrent   See consult note Dr Recardo Evangelist.  Will hold eliquis and arrange right and left heart cath wendsday.  Discussed with daughter PUlmonary HTN:  RV dysfunction on echo with PA systolic 57 mmHg  Right heart cath add nitrates Arrhythmia:  With hisotory of long QT ? Contributing to symptoms EP to arrange ablation  [name] 02/27/2015, 1:11 PM

## 2015-02-27 NOTE — Clinical Social Work Note (Signed)
Clinical Social Work Assessment  Patient Details  Name: [name] MRN: [id number] Date of Birth: 09-04-1934  Date of referral:  02/27/15               Reason for consult:  Discharge Planning                Permission sought to share information with:  Facility Sport and exercise psychologist, Family Supports Permission granted to share information::  Yes, Verbal Permission Granted  Name::     SNFs, and daughter [name]  Agency::     Relationship::     Contact Information:     Housing/Transportation Living arrangements for the past 2 months:  Single Family Home Source of Information:  Adult Children, Patient Patient Interpreter Needed:  None Criminal Activity/Legal Involvement Pertinent to Current Situation/Hospitalization:  No - Comment as needed Significant Relationships:  Adult Children, Other Family Members Lives with:  Self, Adult Children Do you feel safe going back to the place where you live?  Yes Need for family participation in patient care:  No (Coment)  Care giving concerns:  Patient and daughter feel that patient will benefit from a short term SNF stay at discharge. They are concerned that she will not be able to manage at home at this time.   Social Worker assessment / plan:  CSW met with patient and patient's daughter [name] at bedside to complete assessment. Patient was admitted from home where she lives with her son. The patient also states that her daughter lives close by. CSW explained reason for visit (recomendation for SNF). Patient is agreeable to this disposition and requests placement at [facility]. The patient has been to [facility] in the past but wants [facility] due to how close it is to her home. CSW explained SNF search/placement process and answered questions.  Employment status:  Retired Nurse, adult PT Recommendations:  [place] / Referral to community resources:  [place]  Patient/Family's  Response to care:  Patient and patient's daughter were appreciative of CSW assistance with placement.  Patient/Family's Understanding of and Emotional Response to Diagnosis, Current Treatment, and Prognosis:  Patient's daughter appears to have good insight into why the patient was admitted. She states she would like to speak with the doctor.   Emotional Assessment Appearance:  Appears stated age Attitude/Demeanor/Rapport:  Other (Appropriate) Affect (typically observed):  Accepting, Calm, Happy, Stable Orientation:  Oriented to Self, Oriented to Place Alcohol / Substance use:  Never Used Psych involvement (Current and /or in the community):  No (Comment)  Discharge Needs  Concerns to be addressed:  Discharge Planning Concerns Readmission within the last 30 days:  Yes Current discharge risk:  Physical Impairment Barriers to Discharge:  Continued Medical Work up   Fredderick Phenix B, LCSW 02/27/2015, 9:03 AM

## 2015-02-27 NOTE — Progress Notes (Signed)
UR completed 

## 2015-02-27 NOTE — Progress Notes (Signed)
Pt. Have episode of left back pain radiates to the shoulder. Earlier her pain was in the rt. Side to the back with some relief after the tylenol. Now verbalized more the pain is worse. Her monitor EKG shows some PAC's and HR from 70's to 120's. 12 lead EKG done earlier and Dr. Clayborne Artist was paged and made aware of patients pain and result of the EKG. Morphine was given IV with good relief, was able to rest after. Will continue to monitor.

## 2015-02-28 ENCOUNTER — Encounter (HOSPITAL_COMMUNITY)
Admission: EM | Disposition: A | Discharge status: Skilled Nursing Facility | Payer: Medicare Other | Source: Home / Self Care | Attending: Emergency Medicine

## 2015-02-28 ENCOUNTER — Encounter (HOSPITAL_COMMUNITY): Payer: Self-pay | Admitting: Cardiovascular Disease

## 2015-02-28 DIAGNOSIS — I272 Pulmonary hypertension, unspecified: Secondary | ICD-10-CM | POA: Insufficient documentation

## 2015-02-28 DIAGNOSIS — I10 Essential (primary) hypertension: Secondary | ICD-10-CM | POA: Diagnosis not present

## 2015-02-28 DIAGNOSIS — I270 Primary pulmonary hypertension: Secondary | ICD-10-CM | POA: Diagnosis not present

## 2015-02-28 DIAGNOSIS — R0789 Other chest pain: Secondary | ICD-10-CM

## 2015-02-28 DIAGNOSIS — I471 Supraventricular tachycardia: Secondary | ICD-10-CM | POA: Diagnosis not present

## 2015-02-28 DIAGNOSIS — R079 Chest pain, unspecified: Secondary | ICD-10-CM | POA: Diagnosis not present

## 2015-02-28 HISTORY — PX: CARDIAC CATHETERIZATION: SHX172

## 2015-02-28 LAB — BASIC METABOLIC PANEL
ANION GAP: 7 (ref 5–15)
BUN: 19 mg/dL (ref 6–20)
CO2: 34 mmol/L — ABNORMAL HIGH (ref 22–32)
Calcium: 8.3 mg/dL — ABNORMAL LOW (ref 8.9–10.3)
Chloride: 97 mmol/L — ABNORMAL LOW (ref 101–111)
Creatinine, Ser: 0.99 mg/dL (ref 0.44–1.00)
GFR, EST NON AFRICAN AMERICAN: 53 mL/min — AB (ref >60)
Glucose, Bld: 125 mg/dL — ABNORMAL HIGH (ref 70–99)
POTASSIUM: 4.4 mmol/L (ref 3.5–5.1)
SODIUM: 138 mmol/L (ref 135–145)

## 2015-02-28 LAB — POCT I-STAT 3, VENOUS BLOOD GAS (G3P V)
Acid-Base Excess: 7 mmol/L — ABNORMAL HIGH (ref 0.0–2.0)
Bicarbonate: 33.5 mEq/L — ABNORMAL HIGH (ref 20.0–24.0)
O2 Saturation: 51 %
PH VEN: 7.387 — AB (ref 7.250–7.300)
PO2 VEN: 28 mmHg — AB (ref 30.0–45.0)
TCO2: 35 mmol/L (ref 0–100)
pCO2, Ven: 55.8 mmHg — ABNORMAL HIGH (ref 45.0–50.0)

## 2015-02-28 LAB — POCT I-STAT 3, ART BLOOD GAS (G3+)
Acid-Base Excess: 6 mmol/L — ABNORMAL HIGH (ref 0.0–2.0)
BICARBONATE: 31.2 meq/L — AB (ref 20.0–24.0)
O2 Saturation: 87 %
PH ART: 7.434 (ref 7.350–7.450)
TCO2: 33 mmol/L (ref 0–100)
pCO2 arterial: 46.7 mmHg — ABNORMAL HIGH (ref 35.0–45.0)
pO2, Arterial: 52 mmHg — ABNORMAL LOW (ref 80.0–100.0)

## 2015-02-28 SURGERY — RIGHT/LEFT HEART CATH AND CORONARY ANGIOGRAPHY

## 2015-02-28 MED ORDER — SODIUM CHLORIDE 0.9 % IJ SOLN: 3.0000 mL | Freq: Two times a day (BID) | INTRAMUSCULAR | Status: DC

## 2015-02-28 MED ORDER — SODIUM CHLORIDE 0.9 % IV SOLN: 250.0000 mL | INTRAVENOUS | Status: DC | PRN

## 2015-02-28 MED ORDER — ACETAMINOPHEN 325 MG PO TABS: 650.0000 mg | ORAL_TABLET | ORAL | Status: DC | PRN

## 2015-02-28 MED ORDER — FENTANYL CITRATE (PF) 100 MCG/2ML IJ SOLN
INTRAMUSCULAR | Status: DC | PRN
  Administered 2015-02-28: 25 ug via INTRAVENOUS

## 2015-02-28 MED ORDER — FENTANYL CITRATE (PF) 100 MCG/2ML IJ SOLN
INTRAMUSCULAR | Status: AC
  Filled 2015-02-28: qty 2

## 2015-02-28 MED ORDER — APIXABAN 5 MG PO TABS
5.0000 mg | ORAL_TABLET | Freq: Two times a day (BID) | ORAL | Status: DC
  Administered 2015-03-01 – 2015-03-03 (×5): 5 mg via ORAL
  Filled 2015-02-28 (×6): qty 1

## 2015-02-28 MED ORDER — IOHEXOL 350 MG/ML SOLN
INTRAVENOUS | Status: DC | PRN
  Administered 2015-02-28: 100 mL via INTRA_ARTERIAL

## 2015-02-28 MED ORDER — GUAIFENESIN-CODEINE 100-10 MG/5ML PO SOLN
5.0000 mL | Freq: Once | ORAL | Status: AC
  Administered 2015-02-28: 5 mL via ORAL

## 2015-02-28 MED ORDER — SODIUM CHLORIDE 0.9 % IV SOLN: 10.0000 mL/h | INTRAVENOUS | Status: DC

## 2015-02-28 MED ORDER — HEPARIN (PORCINE) IN NACL 2-0.9 UNIT/ML-% IJ SOLN
INTRAMUSCULAR | Status: AC
  Filled 2015-02-28: qty 1500

## 2015-02-28 MED ORDER — ASPIRIN 81 MG PO CHEW
81.0000 mg | CHEWABLE_TABLET | ORAL | Status: AC
  Administered 2015-02-28: 81 mg via ORAL
  Filled 2015-02-28: qty 1

## 2015-02-28 MED ORDER — SODIUM CHLORIDE 0.9 % IJ SOLN
3.0000 mL | Freq: Two times a day (BID) | INTRAMUSCULAR | Status: DC
  Administered 2015-02-28 – 2015-03-03 (×5): 3 mL via INTRAVENOUS

## 2015-02-28 MED ORDER — SODIUM CHLORIDE 0.9 % IJ SOLN: 3.0000 mL | INTRAMUSCULAR | Status: DC | PRN

## 2015-02-28 MED ORDER — MIDAZOLAM HCL 2 MG/2ML IJ SOLN
INTRAMUSCULAR | Status: AC
  Filled 2015-02-28: qty 2

## 2015-02-28 MED ORDER — ONDANSETRON HCL 4 MG/2ML IJ SOLN: 4.0000 mg | Freq: Four times a day (QID) | INTRAMUSCULAR | Status: DC | PRN

## 2015-02-28 MED ORDER — MIDAZOLAM HCL 2 MG/2ML IJ SOLN
INTRAMUSCULAR | Status: DC | PRN
  Administered 2015-02-28: 1 mg via INTRAVENOUS

## 2015-02-28 MED ORDER — LIDOCAINE HCL (PF) 1 % IJ SOLN
INTRAMUSCULAR | Status: AC
  Filled 2015-02-28: qty 30

## 2015-02-28 MED ORDER — GUAIFENESIN 100 MG/5ML PO SYRP
200.0000 mg | ORAL_SOLUTION | ORAL | Status: DC | PRN
  Administered 2015-03-01 (×2): 200 mg via ORAL
  Filled 2015-02-28 (×4): qty 10

## 2015-02-28 MED ORDER — SODIUM CHLORIDE 0.9 % IV SOLN
INTRAVENOUS | Status: AC
  Administered 2015-02-28: 12:00:00 via INTRAVENOUS

## 2015-02-28 MED ORDER — GUAIFENESIN-CODEINE 100-10 MG/5ML PO SOLN
ORAL | Status: AC
  Filled 2015-02-28: qty 5

## 2015-02-28 MED ORDER — SODIUM CHLORIDE 0.9 % IV SOLN
10.0000 mL/h | INTRAVENOUS | Status: DC
  Administered 2015-02-28: 10 mL/h via INTRAVENOUS

## 2015-02-28 SURGICAL SUPPLY — 12 items
CATH INFINITI 5FR JL5 (CATHETERS) ×2 IMPLANT
CATH INFINITI 5FR MULTPACK ANG (CATHETERS) ×2 IMPLANT
CATH SWAN GANZ 7F STRAIGHT (CATHETERS) ×2 IMPLANT
GUIDEWIRE .025 260CM (WIRE) ×3 IMPLANT
KIT HEART LEFT (KITS) ×3 IMPLANT
PACK CARDIAC CATHETERIZATION (CUSTOM PROCEDURE TRAY) ×3 IMPLANT
SHEATH PINNACLE 5F 10CM (SHEATH) ×2 IMPLANT
SHEATH PINNACLE 7F 10CM (SHEATH) ×2 IMPLANT
SYR MEDRAD MARK V 150ML (SYRINGE) ×3 IMPLANT
TRANSDUCER W/STOPCOCK (MISCELLANEOUS) ×6 IMPLANT
WIRE EMERALD 3MM-J .025X260CM (WIRE) ×3 IMPLANT
WIRE EMERALD 3MM-J .035X150CM (WIRE) ×2 IMPLANT

## 2015-02-28 NOTE — Progress Notes (Addendum)
[facility] Progress Note   [name] FWY:[id number] DOB: [birth year] DOA: 02/25/2015 PCP: Loura Pardon, MD  Brief narrative: [name] is a 81 y.o. female with past medical history of PSVT, HTN, DVT, PE,  pulm HTN (mod to severe), right heart failure, OSA on CPAP presents with chest pain and was found to have A-fib with HR of 122 She was admitted 2 weeks ago with similar complaints and was found to have a severely prolonged QT interval. She underwent a stress test which was negative. She was treated for community-acquired pneumonia and discharged on 4/21 on oral antibiotics. She continued to have some symptoms of DOE and recent high resolution CT of the chest was performed on 4/28 which revealed air bronchograms in the right lower lobe suggestive of pneumonia with some mild bronchiectasis in the right lower lobe. Also noted was cardiomegaly with RV and RA dilated patient. She has had to increase her O2 use at home. She was previously only using it at night but has had been using it during the daytime at home for the past 2 month.  She overall feels weak and tired all the time and this is a great source of anxiety for her. Repeat chest x-ray on admission reveals continued bronchitic changes. The patient does not complain of cough. She has had no fever and no leukocytosis to suggest that she has a pneumonia at this time.   Subjective: No complaints today. Awaiting cardiac cath. No c/o tachycardia or chest pain.   Assessment/Plan: Principal Problem: SVT/Atrial fibrillation/mildly elevated troponin -cardiology and EP are evaluating her -left and right heart cath today -on apixaban- nitrate added by cardiology  Active Problems: Pulmonary hypertension (moderate), right heart failure- chronic -may be in relation to obstructive sleep apnea,PEs in the past and maybe also obesity hypoventilation syndrome -last echo revealed severely dilated RV moderately to severely reduced function and  moderate TR. PA peak pressure was 57 -left and right heart cath  today -continue low-dose furosemide - Isordil added by cardiology     Acute on chronic respiratory failure with hypoxia -etiology for hypoxia not fully determined-recent high-resolution CT negative for etiology-may be in relation to pulmonary hypertension versus obesity hypoventilation syndrome -2 L of oxygen is keeping her pulse ox at 94- 98%  Mild AKI on CKD3 -Baseline creatinine is about 0.9 to 1.0 -creatinine was slightly elevated at 1.12 on admission but has improved to 1.07 on 5/2- recheck today    Essential hypertension -continue metoprolol and Isordil- follow BP    History of bilat 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 -Poor short-term memory, poor historian   Code Status: DNR Family Communication:  Disposition Plan: f/u on cath and BP results DVT prophylaxis: on apixaban which is on hold for cath-place SCDs and start low-dose Lovenox Consultants:cardiology Procedures:  Antibiotics: Anti-infectives    None      Objective: Filed Weights   02/25/15 0544 02/25/15 1154 02/28/15 0600  Weight: 99.791 kg (220 lb) 100.1 kg (220 lb 10.9 oz) 100.1 kg (220 lb 10.9 oz)    Intake/Output Summary (Last 24 hours) at 02/28/15 1314 Last data filed at 02/28/15 0920  Gross per 24 hour  Intake 952.33 ml  Output      0 ml  Net 952.33 ml     Vitals Filed Vitals:   02/28/15 1150 02/28/15 1155 02/28/15 1205 02/28/15 1226  BP: 115/65  105/59 113/78 99/58  Pulse: 60 58 60 61  Temp:      TempSrc:      Resp: 21 20 21 18   Height:      Weight:      SpO2: 96% 96% 100% 93%    Exam:  General:  Pt is alert, not in acute distress  HEENT: No icterus, No thrush  Cardiovascular: regular rate and rhythm, S1/S2 No murmur- tachycardic  Respiratory: clear to auscultation bilaterally   Abdomen: Soft, +Bowel sounds, non  tender, non distended, no guarding  MSK: No LE edema, cyanosis or clubbing  Data Reviewed: Basic Metabolic Panel:  Recent Labs Lab 02/25/15 0559 02/26/15 0836  NA 137 138  K 4.1 4.5  CL 94* 93*  CO2 35* 38*  GLUCOSE 112* 120*  BUN 19 19  CREATININE 1.12* 1.07*  CALCIUM 9.3 8.9   Liver Function Tests: No results for input(s): AST, ALT, ALKPHOS, BILITOT, PROT, ALBUMIN in the last 168 hours. No results for input(s): LIPASE, AMYLASE in the last 168 hours. No results for input(s): AMMONIA in the last 168 hours. CBC:  Recent Labs Lab 02/25/15 0559  WBC 6.2  NEUTROABS 4.3  HGB 12.7  HCT 42.9  MCV 98.8  PLT 153   Cardiac Enzymes:  Recent Labs Lab 02/25/15 0827 02/25/15 1443 02/25/15 2021  TROPONINI 0.04* 0.03 0.04*   BNP (last 3 results) No results for input(s): BNP in the last 8760 hours.  ProBNP (last 3 results)  Recent Labs  02/19/15 1527  PROBNP 1306.0*    CBG: No results for input(s): GLUCAP in the last 168 hours.  No results found for this or any previous visit (from the past 240 hour(s)).   Studies: No results found.  Scheduled Meds:  Scheduled Meds: . [START ON 03/01/2015] apixaban  5 mg Oral BID  . feeding supplement (RESOURCE BREEZE)  1 Container Oral TID BM  . furosemide  20 mg Oral Daily  . isosorbide dinitrate  10 mg Oral TID  . metoprolol  25 mg Oral BID  . multivitamin with minerals  1 tablet Oral Daily  . polyethylene glycol  8.5 g Oral Daily  . potassium chloride  40 mEq Oral Daily  . sodium chloride  3 mL Intravenous Q12H   Continuous Infusions: . sodium chloride 150 mL/hr at 02/28/15 1229    Time spent on care of this patient: 35 minutes   [place], MD 02/28/2015, 1:14 PM  LOS: 3 days   [facility] Office  [phone] Pager - Text Page per [email or web address]  If 7PM-7AM, please contact night-coverage [email or web address]

## 2015-02-28 NOTE — H&P (View-Only) (Signed)
Patient ID: [name], female   DOB: December 06, 1933, 81 y.o.   MRN: [id number]    Subjective:  Denies SSCP, palpitations or Dyspnea Some dementia Spoke with daughter   Objective:  Filed Vitals:   02/25/15 2028 02/26/15 0031 02/26/15 0420 02/26/15 0638  BP: 107/75 129/73  132/79  Pulse: 124 70 79   Temp: 98.3 F (36.8 C) 98.7 F (37.1 C) 98.3 F (36.8 C)   TempSrc: Oral Oral Oral   Resp: 21 20 20    Height:      Weight:      SpO2: 94% 96% 94%     Intake/Output from previous day:  Intake/Output Summary (Last 24 hours) at 02/26/15 0956 Last data filed at 02/26/15 0730  Gross per 24 hour  Intake    640 ml  Output      0 ml  Net    640 ml    Physical Exam: Affect appropriate Obese white female  HEENT: normal Neck supple with no adenopathy JVP normal no bruits no thyromegaly Lungs clear with no wheezing and good diaphragmatic motion Heart:  S1/S2 no murmur, no rub, gallop or click PMI normal Abdomen: benighn, BS positve, no tenderness, no AAA no bruit.  No HSM or HJR Distal pulses intact with no bruits No edema Neuro non-focal Skin warm and dry No muscular weakness   Lab Results: Basic Metabolic Panel:  Recent Labs  02/25/15 0559 02/26/15 0836  NA 137 138  K 4.1 4.5  CL 94* 93*  CO2 35* 38*  GLUCOSE 112* 120*  BUN 19 19  CREATININE 1.12* 1.07*  CALCIUM 9.3 8.9   CBC:  Recent Labs  02/25/15 0559  WBC 6.2  NEUTROABS 4.3  HGB 12.7  HCT 42.9  MCV 98.8  PLT 153   Cardiac Enzymes:  Recent Labs  02/25/15 0827 02/25/15 1443 02/25/15 2021  TROPONINI 0.04* 0.03 0.04*    Imaging: Dg Chest 2 View  02/25/2015   CLINICAL DATA:  Chest pain and shortness of breath.  EXAM: CHEST  2 VIEW  COMPARISON:  Chest radiograph February 10, 2015 and CT of the chest February 22, 2015  FINDINGS: The cardiac silhouette is moderately enlarged, unchanged. Tortuous aorta. Small to moderate RIGHT pleural effusion was present previously. Similar bronchitic changes. Minimal  biapical pleural thickening. No pneumothorax. Soft tissue planes and included osseous structure nonsuspicious.  IMPRESSION: Stable cardiomegaly. Similar bronchitic changes with small to moderate RIGHT pleural effusion. Recommend followup chest radiograph after treatment to verify improvement.   Electronically Signed   By: Elon Alas   On: 02/25/2015 06:51    Cardiac Studies:  ECG:  Atrial tachycardia low voltage    Telemetry:  Multiple episodes ? Atrial tachycardia rates 120-14  Echo:  02/12/15 Study Conclusions  - Left ventricle: Paradoxical septal motiom. The cavity size was normal. Wall thickness was increased in a pattern of mild LVH. The estimated ejection fraction was 55%. - Mitral valve: There was mild regurgitation. - Right ventricle: The cavity size was severely dilated. Systolic function was moderately to severely reduced. - Tricuspid valve: There was moderate regurgitation. - Pulmonary arteries: PA peak pressure: 57 mm Hg (S). - Pericardium, extracardiac: A small pericardial effusion was identified posterior to the heart.  Medications:   . apixaban  5 mg Oral BID  . feeding supplement (RESOURCE BREEZE)  1 Container Oral TID BM  . furosemide  20 mg Oral Daily  . metoprolol  25 mg Oral BID  . multivitamin with minerals  1  tablet Oral Daily  . polyethylene glycol  8.5 g Oral Daily  . potassium chloride  40 mEq Oral Daily       Assessment/Plan:  Chest Pain:  Normal myovue 02/12/15 recurrent   See consult note Dr Recardo Evangelist.  Will hold eliquis and arrange right and left heart cath wendsday.  Discussed with daughter PUlmonary HTN:  RV dysfunction on echo with PA systolic 57 mmHg  Right heart cath add nitrates Arrhythmia:  With hisotory of long QT ? Contributing to symptoms will ask EP to see ? Antiarrhythmic while in hospital ? Mapping and ablation likely right sided foci  [name] 02/26/2015, 9:56 AM

## 2015-02-28 NOTE — Interval H&P Note (Signed)
Cath Lab Visit (complete for each Cath Lab visit)  Clinical Evaluation Leading to the Procedure:   ACS: No.  Non-ACS:    Anginal Classification: CCS II  Anti-ischemic medical therapy: Maximal Therapy (2 or more classes of medications)  Non-Invasive Test Results: Low-risk stress test findings: cardiac mortality <1%/year  Prior CABG: No previous CABG      History and Physical Interval Note:  02/28/2015 9:42 AM  [name]  has presented today for surgery, with the diagnosis of cp  The various methods of treatment have been discussed with the patient and family. After consideration of risks, benefits and other options for treatment, the patient has consented to  Procedure(s): Right/Left Heart Cath and Coronary Angiography (N/A) as a surgical intervention .  The patient's history has been reviewed, patient examined, no change in status, stable for surgery.  I have reviewed the patient's chart and labs.  Questions were answered to the patient's satisfaction.     [name] A

## 2015-02-28 NOTE — Progress Notes (Signed)
OT evaluation addendum    02/26/15 1407  OT Time Calculation  OT Start Time (ACUTE ONLY) 1337  OT Stop Time (ACUTE ONLY) 1359  OT Time Calculation (min) 22 min  OT G-codes **NOT FOR INPATIENT CLASS**  Functional Assessment Tool Used clinical judgment  Functional Limitation Self care  Self Care Current Status (N8676) CK  Self Care Goal Status (H2094) CI  OT General Charges  $OT Visit 1 Procedure  OT Evaluation  $Initial OT Evaluation Tier I 1 Procedure   Roseanne Reno, OTR/L [phone]

## 2015-02-28 NOTE — Progress Notes (Signed)
Site area: right  Groin a 5 french arterial and a 7 french venous sheath was removed  Site Prior to Removal:  Level 0  Pressure Applied For 20 MINUTES    Minutes Beginning at 1130a  Manual:   Yes.    Patient Status During Pull:  stable  Post Pull Groin Site:  Level 0  Post Pull Instructions Given:  Yes.    Post Pull Pulses Present:  Yes.    Dressing Applied:  Yes.    Comments:  VS remain stable during sheath pull.   Pt denies any discomfort at site.

## 2015-02-28 NOTE — Progress Notes (Signed)
UR completed 

## 2015-02-28 NOTE — Progress Notes (Signed)
[name] Date of Encounter: 02/28/2015  Principal Problem:   Chest pain Active Problems:   Dyslipidemia   Essential hypertension   PSVT with SSS   SPINAL STENOSIS, LUMBAR   Pulmonary hypertension, moderate to severe   Acute right ventricular heart failure   Acute cor pulmonale   History of bilat PE after TKR (off Coumadin Dec 2015)   Acute on chronic respiratory failure with hypoxia   Physical deconditioning   Primary Cardiologist: Dr Rayann Heman  Patient Profile: 81 yo female w/ hx PSVT, P-HTN, OHS, OSA, HTN, HL, PE, admitted 05/01 w/ SVT, CP, dyspnea. EP saw, rec SVT ablation.   SUBJECTIVE: Feels breathing is at baseline, no chest pain now, had palps at times yesterday. Ready for cath.  OBJECTIVE Filed Vitals:   02/27/15 2157 02/27/15 2204 02/28/15 0513 02/28/15 0600  BP: 115/74  123/74   Pulse: 65 64 57   Temp: 97.5 F (36.4 C)  97.4 F (36.3 C)   TempSrc: Oral  Oral   Resp: 19 19 19    Height:    5\' 6"  (1.676 m)  Weight:    220 lb 10.9 oz (100.1 kg)  SpO2: 98% 97% 96%     Intake/Output Summary (Last 24 hours) at 02/28/15 0852 Last data filed at 02/28/15 6378  Gross per 24 hour  Intake 1052.33 ml  Output      0 ml  Net 1052.33 ml   Filed Weights   02/25/15 0544 02/25/15 1154 02/28/15 0600  Weight: 220 lb (99.791 kg) 220 lb 10.9 oz (100.1 kg) 220 lb 10.9 oz (100.1 kg)    PHYSICAL EXAM General: Well developed, well nourished, female in no acute distress. Head: Normocephalic, atraumatic.  Neck: Supple without bruits, JVD elevated. Lungs:  Resp regular and unlabored, rales bases. Heart: RRR, S1, S2, no S3, S4, or murmur; no rub. Abdomen: Soft, non-tender, non-distended, BS + x 4.  Extremities: No clubbing, cyanosis, no edema.  Neuro: Alert and oriented X 3. Moves all extremities spontaneously. Psych: Normal affect.  LABS: Basic Metabolic Panel: Recent Labs  02/26/15 0836  NA 138  K 4.5  CL 93*  CO2 38*  GLUCOSE 120*  BUN 19    CREATININE 1.07*  CALCIUM 8.9   Cardiac Enzymes: Recent Labs  02/25/15 1443 02/25/15 2021  TROPONINI 0.03 0.04*   Lab Results  Component Value Date   CHOL 104 03/14/2014   HDL 32* 03/14/2014   LDLCALC 53 03/14/2014   TRIG 95 03/14/2014   CHOLHDL 3.3 03/14/2014    TELE:    SR, atrial tach from 14:47>>20:48, 1 other brief episode noted    Current Medications:  . enoxaparin (LOVENOX) injection  50 mg Subcutaneous Q24H  . feeding supplement (RESOURCE BREEZE)  1 Container Oral TID BM  . furosemide  20 mg Oral Daily  . isosorbide dinitrate  10 mg Oral TID  . metoprolol  25 mg Oral BID  . multivitamin with minerals  1 tablet Oral Daily  . polyethylene glycol  8.5 g Oral Daily  . potassium chloride  40 mEq Oral Daily  . sodium chloride  3 mL Intravenous Q12H   . sodium chloride 10 mL/hr (02/28/15 0511)    ASSESSMENT AND PLAN: Principal Problem:   Chest pain - nl MV 02/12/2015 - minimal elevation troponin - pain-free now, R/L cath today - she has had ASA 81 mg    PSVT with SSS - ablation per EP  Otherwise, per IM Active Problems:  Dyslipidemia   Essential hypertension   SPINAL STENOSIS, LUMBAR - PRN rx   Pulmonary hypertension, moderate to severe   Acute right ventricular heart failure   Acute cor pulmonale   History of bilat PE after TKR (off Coumadin Dec 2015)   Acute on chronic respiratory failure with hypoxia   Physical deconditioning   Signed, Barrett, Suanne Marker , PA-C 8:52 AM 02/28/2015  Less ectopic atrial rhythm this am  Continue beta blocker  Need to nail down ablation plans with EP pending  Results of right and left heart cath

## 2015-02-28 NOTE — Progress Notes (Signed)
Pt. Refused cpap. 

## 2015-03-01 DIAGNOSIS — M4806 Spinal stenosis, lumbar region: Secondary | ICD-10-CM | POA: Diagnosis not present

## 2015-03-01 DIAGNOSIS — R0789 Other chest pain: Secondary | ICD-10-CM | POA: Diagnosis not present

## 2015-03-01 DIAGNOSIS — I471 Supraventricular tachycardia: Secondary | ICD-10-CM | POA: Diagnosis not present

## 2015-03-01 DIAGNOSIS — J9621 Acute and chronic respiratory failure with hypoxia: Secondary | ICD-10-CM | POA: Diagnosis not present

## 2015-03-01 DIAGNOSIS — I270 Primary pulmonary hypertension: Secondary | ICD-10-CM | POA: Diagnosis not present

## 2015-03-01 MED ORDER — HYDROCOD POLST-CPM POLST ER 10-8 MG/5ML PO SUER
5.0000 mL | Freq: Two times a day (BID) | ORAL | Status: DC | PRN
  Administered 2015-03-03: 5 mL via ORAL
  Filled 2015-03-01: qty 5

## 2015-03-01 NOTE — Progress Notes (Signed)
Occupational Therapy Treatment Patient Details Name: [name] MRN: [id number] DOB: December 03, 1933 Today's Date: 03/01/2015    History of present illness Pt is a 81 y/o female with multiple medical problems who was recently discharged on 4/21 after admission for PNA. Since d/c pt has not improved back to baseline of function. Pt presents back to the ED with SOB and CP.    OT comments  Pt seen today for activity tolerance and strengthening. Pt is progressing with ADLs and mobility and will continue to benefit from acute OT.   Follow Up Recommendations  SNF    Equipment Recommendations  Other (comment) (defer to next venue)    Recommendations for Other Services      Precautions / Restrictions Precautions Precautions: Fall Restrictions Weight Bearing Restrictions: No       Mobility Bed Mobility Overal bed mobility: Needs Assistance Bed Mobility: Supine to Sit;Sit to Supine     Supine to sit: Min guard Sit to supine: Min assist   General bed mobility comments: [place] position used to scoot HOB and cues given for technique. With HOB elevated, pt able to use bed rail and return to supine without physical assist.   Transfers Overall transfer level: Needs assistance Equipment used: Rolling walker (2 wheeled) Transfers: Sit to/from Stand Sit to Stand: Min assist         General transfer comment: Min A to stand x1 from EOB to fix gown.         ADL Overall ADL's : Needs assistance/impaired     Grooming: Set up;Sitting               Lower Body Dressing: Minimal assistance;Sit to/from stand                 General ADL Comments: Pt limited by fatigue but agreeable to participate in therapeutic exercise sitting EOB. Pt demonstrated good breathing techniques. After returning to supine, pt had coughing fit and reports urine leakage when coughing. Pt appeared upset with this and positioned with towel between legs to assist with comfort.                  Cognition  Arousal/Alertness: Awake/Alert Behavior During Therapy: WFL for tasks assessed/performed Overall Cognitive Status: Within Functional Limits for tasks assessed                         Exercises Other Exercises Other Exercises: Pt sat EOB and performed Bil Shoulder flexion x10, Bil shoulder horizontal abduction with external rotation x 10, scapular elevation with 2 second hold x2, leg kicks x5 each leg.            Pertinent Vitals/ Pain       Pain Assessment: No/denies pain  Home Living                                              Frequency Min 2X/week     Progress Toward Goals  OT Goals(current goals can now be found in the care plan section)  Progress towards OT goals: Progressing toward goals  Acute Rehab OT Goals Patient Stated Goal: to go to rehab and get strong  Plan Discharge plan remains appropriate       End of Session Equipment Utilized During Treatment: Gait belt;Rolling walker;Oxygen   Activity Tolerance Patient tolerated treatment well  Patient Left in bed;with call bell/phone within reach;with bed alarm set;with family/visitor present   Nurse Communication      Functional Assessment Tool Used: clinical judgment Functional Limitation: Self care Self Care Current Status (M0867): At least 40 percent but less than 60 percent impaired, limited or restricted Self Care Goal Status (Y1950): At least 1 percent but less than 20 percent impaired, limited or restricted   Time: 9326-7124 OT Time Calculation (min): 23 min  Charges: OT G-codes **NOT FOR INPATIENT CLASS** Functional Assessment Tool Used: clinical judgment Functional Limitation: Self care Self Care Current Status (P8099): At least 40 percent but less than 60 percent impaired, limited or restricted Self Care Goal Status (I3382): At least 1 percent but less than 20 percent impaired, limited or restricted OT General Charges $OT Visit: 1 Procedure OT  Treatments $Self Care/Home Management : 8-22 mins $Therapeutic Exercise: 8-22 mins  Villa Herb M 03/01/2015, 5:16 PM  Secundino Ginger Lynetta Mare, OTR/L Occupational Therapist [phone] (pager)

## 2015-03-01 NOTE — Progress Notes (Signed)
Central tele called and to make RN aware of 2.69 pause and bradycardia (HR 30's), 2nd degree AV block type 2, HR is back up in the 60's, vital signs stable, MD made aware, awaiting for EP consult.  Rowe Pavy, RN

## 2015-03-01 NOTE — Progress Notes (Signed)
PT Cancellation Note  Patient Details Name: [name] MRN: [id number] DOB: 1934-07-04   Cancelled Treatment:    Reason Eval/Treat Not Completed: Medical issues which prohibited therapy.  Patient having bradycardia today, with 2nd degree AV block.  Awaiting EP consult.  Will hold PT today.  Will return at later time when appropriate for patient.   Despina Pole 03/01/2015, 5:14 PM Carita Pian. Sanjuana Kava, [place] Pager [phone]

## 2015-03-01 NOTE — Progress Notes (Signed)
Pt requesting something stronger for cough, Kathrerine Schorr NPC made aware and orders received will continue to monitor patient. Dymphna Wadley, Bettina Gavia RN

## 2015-03-01 NOTE — Progress Notes (Signed)
[facility] Progress Note   JERUSALEN MATEJA JYN:[id number] DOB: [birth year] DOA: 02/25/2015 PCP: Loura Pardon, MD  Brief narrative: IZELA ALTIER is a 81 y.o. female with past medical history of PSVT, HTN, DVT, PE,  pulm HTN (mod to severe), right heart failure, OSA on CPAP presents with chest pain and was found to have A-fib with HR of 122 She was admitted 2 weeks ago with similar complaints and was found to have a severely prolonged QT interval. She underwent a stress test which was negative. She was treated for community-acquired pneumonia and discharged on 4/21 on oral antibiotics. She continued to have some symptoms of DOE and recent high resolution CT of the chest was performed on 4/28 which revealed air bronchograms in the right lower lobe suggestive of pneumonia with some mild bronchiectasis in the right lower lobe. Also noted was cardiomegaly with RV and RA dilated patient. She has had to increase her O2 use at home. She was previously only using it at night but has had been using it during the daytime at home for the past 2 month.  She overall feels weak and tired all the time and this is a great source of anxiety for her. Repeat chest x-ray on admission reveals continued bronchitic changes. The patient does not complain of cough. She has had no fever and no leukocytosis to suggest that she has a pneumonia at this time.   Subjective: Sitting up in a chair. No palpitations, chest pain or dyspnea.   Assessment/Plan: Principal Problem: SVT/Atrial fibrillation/mildly elevated troponin - awaiting further input from EP today -on apixaban-  Active Problems: Pulmonary hypertension (moderate), right heart failure- chronic -may be in relation to obstructive sleep apnea,PEs in the past and maybe also obesity hypoventilation syndrome -last echo revealed severely dilated RV moderately to severely reduced function and moderate TR. PA peak pressure was 57 -left and right heart cath performed  yesterday- no significant CAD seen, mild RCA stenosis note- EF 50-55%, mild to mod pulm HTN -continue low-dose furosemide - Isordil added by cardiology     Acute on chronic respiratory failure with hypoxia -etiology for hypoxia not fully determined-recent high-resolution CT negative for etiology-may be in relation to pulmonary hypertension versus obesity hypoventilation syndrome -2 L of oxygen is keeping her pulse ox at 94- 98%  Mild AKI on CKD3 -Baseline creatinine is about 0.9 to 1.0 -creatinine was slightly elevated at 1.12 on admission but has improved to 0.99    Essential hypertension -continue metoprolol and Isordil-     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 -Poor short-term memory, poor historian   Code Status: DNR Family Communication:  Disposition Plan: f/u on cath and BP results DVT prophylaxis: on apixaban  Consultants:cardiology Procedures:  Antibiotics: Anti-infectives    None      Objective: Filed Weights   02/25/15 0544 02/25/15 1154 02/28/15 0600  Weight: 99.791 kg (220 lb) 100.1 kg (220 lb 10.9 oz) 100.1 kg (220 lb 10.9 oz)    Intake/Output Summary (Last 24 hours) at 03/01/15 1116 Last data filed at 03/01/15 0730  Gross per 24 hour  Intake    723 ml  Output      0 ml  Net    723 ml     Vitals Filed Vitals:   02/28/15 1545 02/28/15 1615 02/28/15 2202 03/01/15 8657  BP: 108/77 109/72 113/76 100/71  Pulse: 70 72 124 121  Temp:   97.7 F (36.5 C) 98.1 F (36.7 C)  TempSrc:   Oral Oral  Resp:   18 18  Height:      Weight:      SpO2:   96% 95%    Exam:  General:  Pt is alert, not in acute distress  HEENT: No icterus, No thrush  Cardiovascular: regular rate and rhythm, S1/S2 No murmur-  Respiratory: clear to auscultation bilaterally   Abdomen: Soft, +Bowel sounds, non tender, non distended,  no guarding  MSK: No LE edema, cyanosis or clubbing  Data Reviewed: Basic Metabolic Panel:  Recent Labs Lab 02/25/15 0559 02/26/15 0836 02/28/15 1419  NA 137 138 138  K 4.1 4.5 4.4  CL 94* 93* 97*  CO2 35* 38* 34*  GLUCOSE 112* 120* 125*  BUN 19 19 19   CREATININE 1.12* 1.07* 0.99  CALCIUM 9.3 8.9 8.3*   Liver Function Tests: No results for input(s): AST, ALT, ALKPHOS, BILITOT, PROT, ALBUMIN in the last 168 hours. No results for input(s): LIPASE, AMYLASE in the last 168 hours. No results for input(s): AMMONIA in the last 168 hours. CBC:  Recent Labs Lab 02/25/15 0559  WBC 6.2  NEUTROABS 4.3  HGB 12.7  HCT 42.9  MCV 98.8  PLT 153   Cardiac Enzymes:  Recent Labs Lab 02/25/15 0827 02/25/15 1443 02/25/15 2021  TROPONINI 0.04* 0.03 0.04*   BNP (last 3 results) No results for input(s): BNP in the last 8760 hours.  ProBNP (last 3 results)  Recent Labs  02/19/15 1527  PROBNP 1306.0*    CBG: No results for input(s): GLUCAP in the last 168 hours.  No results found for this or any previous visit (from the past 240 hour(s)).   Studies: No results found.  Scheduled Meds:  Scheduled Meds: . apixaban  5 mg Oral BID  . feeding supplement (RESOURCE BREEZE)  1 Container Oral TID BM  . furosemide  20 mg Oral Daily  . isosorbide dinitrate  10 mg Oral TID  . metoprolol  25 mg Oral BID  . multivitamin with minerals  1 tablet Oral Daily  . polyethylene glycol  8.5 g Oral Daily  . potassium chloride  40 mEq Oral Daily  . sodium chloride  3 mL Intravenous Q12H   Continuous Infusions:    Time spent on care of this patient: 35 minutes   Emerson, MD 03/01/2015, 11:16 AM  LOS: 4 days   [facility] Office  [phone] Pager - Text Page per [email or web address]  If 7PM-7AM, please contact night-coverage [email or web address]

## 2015-03-01 NOTE — Progress Notes (Signed)
Patient ID: [name], female   DOB: [birth year], 81 y.o.   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 EP saw, rec SVT ablation.   SUBJECTIVE: Doesn't feel well but no specific complaints   OBJECTIVE Filed Vitals:   02/28/15 1545 02/28/15 1615 02/28/15 2202 03/01/15 0620  BP: 108/77 109/72 113/76 100/71  Pulse: 70 72 124 121  Temp:   97.7 F (36.5 C) 98.1 F (36.7 C)  TempSrc:   Oral Oral  Resp:   18 18  Height:      Weight:      SpO2:   96% 95%    Intake/Output Summary (Last 24 hours) at 03/01/15 0958 Last data filed at 03/01/15 0730  Gross per 24 hour  Intake    723 ml  Output      0 ml  Net    723 ml   Filed Weights   02/25/15 0544 02/25/15 1154 02/28/15 0600  Weight: 220 lb (99.791 kg) 220 lb 10.9 oz (100.1 kg) 220 lb 10.9 oz (100.1 kg)    PHYSICAL EXAM General: Well developed, well nourished, female in no acute distress. Head: Normocephalic, atraumatic.  Neck: Supple without bruits, JVD elevated. Lungs:  Resp regular and unlabored, rales bases. Heart: RRR, S1, S2, no S3, S4, or murmur; no rub. Abdomen: Soft, non-tender, non-distended, BS + x 4.  Extremities: No clubbing, cyanosis, no edema.  Neuro: Alert and oriented X 3. Moves all extremities spontaneously. Psych: Normal affect. RFA/RFV mild bruising no hematoma post cath   LABS: Basic Metabolic  Panel:  Recent Labs  02/28/15 1419  NA 138  K 4.4  CL 97*  CO2 34*  GLUCOSE 125*  BUN 19  CREATININE 0.99  CALCIUM 8.3*   Cardiac Enzymes:No results for input(s): CKTOTAL, CKMB, CKMBINDEX, TROPONINI in the last 72 hours. Lab Results  Component Value Date   CHOL 104 03/14/2014   HDL 32* 03/14/2014   LDLCALC 53 03/14/2014   TRIG 95 03/14/2014   CHOLHDL 3.3 03/14/2014    TELE:    SR, atrial tach from 14:47>>20:48, 1 other brief episode noted    Current Medications:  . apixaban  5 mg Oral BID  . feeding supplement (RESOURCE BREEZE)  1 Container Oral TID BM  . furosemide  20 mg Oral Daily  . isosorbide dinitrate  10 mg Oral TID  . metoprolol  25 mg Oral BID  . multivitamin with minerals  1 tablet Oral Daily  . polyethylene glycol  8.5 g Oral Daily  . potassium chloride  40 mEq Oral Daily  . sodium chloride  3 mL Intravenous Q12H      ASSESSMENT AND PLAN: Chest Pain:  Reviewed cath from yesterday no significant CAD Dyspnea EF 50-55%  EDP ok PA systolic only 38 mmHg MAT:  Atrial arrhythmia  Now that cath done  will ask EP about plans for ablation continue beta blocker would need to hold eliquis for 48 hrs before ablation

## 2015-03-02 ENCOUNTER — Encounter (HOSPITAL_COMMUNITY)
Admission: EM | Disposition: A | Discharge status: Skilled Nursing Facility | Payer: Medicare Other | Source: Home / Self Care | Attending: Emergency Medicine

## 2015-03-02 DIAGNOSIS — I472 Ventricular tachycardia: Secondary | ICD-10-CM | POA: Diagnosis not present

## 2015-03-02 DIAGNOSIS — I272 Other secondary pulmonary hypertension: Secondary | ICD-10-CM | POA: Diagnosis not present

## 2015-03-02 DIAGNOSIS — I10 Essential (primary) hypertension: Secondary | ICD-10-CM | POA: Diagnosis not present

## 2015-03-02 DIAGNOSIS — E785 Hyperlipidemia, unspecified: Secondary | ICD-10-CM | POA: Diagnosis not present

## 2015-03-02 DIAGNOSIS — I270 Primary pulmonary hypertension: Secondary | ICD-10-CM | POA: Diagnosis not present

## 2015-03-02 DIAGNOSIS — I471 Supraventricular tachycardia: Secondary | ICD-10-CM | POA: Diagnosis not present

## 2015-03-02 HISTORY — PX: ELECTROPHYSIOLOGIC STUDY: SHX172A

## 2015-03-02 LAB — CREATININE, SERUM
Creatinine, Ser: 1.04 mg/dL — ABNORMAL HIGH (ref 0.44–1.00)
GFR calc non Af Amer: 50 mL/min — ABNORMAL LOW (ref >60)
GFR, EST AFRICAN AMERICAN: 58 mL/min — AB (ref >60)

## 2015-03-02 LAB — CBC
HCT: 40.2 % (ref 36.0–46.0)
HEMOGLOBIN: 12 g/dL (ref 12.0–15.0)
MCH: 29.6 pg (ref 26.0–34.0)
MCHC: 29.9 g/dL — AB (ref 30.0–36.0)
MCV: 99.3 fL (ref 78.0–100.0)
Platelets: 180 10*3/uL (ref 150–400)
RBC: 4.05 MIL/uL (ref 3.87–5.11)
RDW: 17.7 % — ABNORMAL HIGH (ref 11.5–15.5)
WBC: 5.6 10*3/uL (ref 4.0–10.5)

## 2015-03-02 SURGERY — A-FLUTTER/A-TACH/SVT ABLATION
Anesthesia: LOCAL

## 2015-03-02 MED ORDER — SODIUM CHLORIDE 0.9 % IJ SOLN
3.0000 mL | Freq: Two times a day (BID) | INTRAMUSCULAR | Status: DC
  Administered 2015-03-02 – 2015-03-03 (×2): 3 mL via INTRAVENOUS

## 2015-03-02 MED ORDER — FENTANYL CITRATE (PF) 100 MCG/2ML IJ SOLN
INTRAMUSCULAR | Status: DC | PRN
  Administered 2015-03-02 (×2): 12.5 ug via INTRAVENOUS

## 2015-03-02 MED ORDER — ONDANSETRON HCL 4 MG/2ML IJ SOLN: 4.0000 mg | Freq: Four times a day (QID) | INTRAMUSCULAR | Status: DC | PRN

## 2015-03-02 MED ORDER — FENTANYL CITRATE (PF) 100 MCG/2ML IJ SOLN
INTRAMUSCULAR | Status: AC
  Filled 2015-03-02: qty 2

## 2015-03-02 MED ORDER — ACETAMINOPHEN 325 MG PO TABS: 650.0000 mg | ORAL_TABLET | ORAL | Status: DC | PRN

## 2015-03-02 MED ORDER — HEPARIN SODIUM (PORCINE) 5000 UNIT/ML IJ SOLN: 5000.0000 [IU] | Freq: Three times a day (TID) | INTRAMUSCULAR | Status: DC

## 2015-03-02 MED ORDER — MIDAZOLAM HCL 5 MG/5ML IJ SOLN
INTRAMUSCULAR | Status: DC | PRN
  Administered 2015-03-02 (×2): 1 mg via INTRAVENOUS

## 2015-03-02 MED ORDER — SODIUM CHLORIDE 0.9 % IJ SOLN: 3.0000 mL | INTRAMUSCULAR | Status: DC | PRN

## 2015-03-02 MED ORDER — MIDAZOLAM HCL 5 MG/5ML IJ SOLN
INTRAMUSCULAR | Status: AC
  Filled 2015-03-02: qty 5

## 2015-03-02 MED ORDER — SODIUM CHLORIDE 0.9 % IV SOLN: 250.0000 mL | INTRAVENOUS | Status: DC | PRN

## 2015-03-02 MED ORDER — HEPARIN (PORCINE) IN NACL 2-0.9 UNIT/ML-% IJ SOLN
INTRAMUSCULAR | Status: AC
  Filled 2015-03-02: qty 500

## 2015-03-02 MED ORDER — BUPIVACAINE HCL (PF) 0.25 % IJ SOLN
INTRAMUSCULAR | Status: AC
  Filled 2015-03-02: qty 60

## 2015-03-02 SURGICAL SUPPLY — 10 items
BAG SNAP BAND KOVER 36X36 (MISCELLANEOUS) ×2
CATH EZ STEER NAV 4MM D-F CUR (ABLATOR) ×2
CATH JOSEPHSON QUAD-ALLRED 6FR (CATHETERS) ×2
CATH POLARIS X 2.5/5/2.5 DECAP (CATHETERS) ×2
PACK EP LATEX FREE (CUSTOM PROCEDURE TRAY) ×2
PACK EP LF (CUSTOM PROCEDURE TRAY) ×1
PAD DEFIB LIFELINK (PAD) ×2
PATCH CARTO3 (PAD) ×2
SHEATH PINNACLE 6F 10CM (SHEATH) ×4
SHEATH PINNACLE 8F 10CM (SHEATH) ×2

## 2015-03-02 NOTE — Progress Notes (Signed)
Patient ID: [name], female   DOB: 13-Mar-1934, 81 y.o.   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 EP saw, rec SVT ablation.   SUBJECTIVE: Feels ok Appears to be in atrial tachycardia rate 122 this am   OBJECTIVE Filed Vitals:   03/01/15 1725 03/01/15 1915 03/01/15 1959 03/02/15 0449  BP: 123/65  95/67 110/79  Pulse: 65 120 123 115  Temp:   98.4 F (36.9 C) 98.2 F (36.8 C)  TempSrc:   Oral Oral  Resp:   18 18  Height:      Weight:      SpO2:  90% 93% 94%    Intake/Output Summary (Last 24 hours) at 03/02/15 0813 Last data filed at 03/01/15 1630  Gross per 24 hour  Intake    600 ml  Output      0 ml  Net    600 ml   Filed Weights   02/25/15 0544 02/25/15 1154 02/28/15 0600  Weight: 220 lb (99.791 kg) 220 lb 10.9 oz (100.1 kg) 220 lb 10.9 oz (100.1 kg)    PHYSICAL EXAM General: Well developed, well nourished, female in no acute distress. Head: Normocephalic, atraumatic.  Neck: Supple without bruits, JVD elevated. Prominent left carotid pulse  Lungs:  Resp regular and unlabored, rales bases. Heart: RRR, S1, S2, no S3, S4, or murmur; no rub. Abdomen: Soft, non-tender, non-distended, BS + x 4.  Extremities: No clubbing, cyanosis, no edema.  Neuro: Alert and oriented X 3. Moves all extremities spontaneously. Psych: Normal affect. RFA/RFV mild bruising no hematoma post  cath   LABS: Basic Metabolic Panel:  Recent Labs  02/28/15 1419  NA 138  K 4.4  CL 97*  CO2 34*  GLUCOSE 125*  BUN 19  CREATININE 0.99  CALCIUM 8.3*   Cardiac Enzymes:No results for input(s): CKTOTAL, CKMB, CKMBINDEX, TROPONINI in the last 72 hours. Lab Results  Component Value Date   CHOL 104 03/14/2014   HDL 32* 03/14/2014   LDLCALC 53 03/14/2014   TRIG 95 03/14/2014   CHOLHDL 3.3 03/14/2014    TELE:    Atrial tach rate 122  03/02/2015   Current Medications:  . apixaban  5 mg Oral BID  . feeding supplement (RESOURCE BREEZE)  1 Container Oral TID BM  . furosemide  20 mg Oral Daily  . isosorbide dinitrate  10 mg Oral TID  . metoprolol  25 mg Oral BID  . multivitamin with minerals  1 tablet Oral Daily  . polyethylene glycol  8.5 g Oral Daily  . potassium chloride  40 mEq Oral Daily  . sodium chloride  3 mL Intravenous Q12H      ASSESSMENT AND PLAN: Chest Pain:  Reviewed cath no significant CAD Dyspnea EF 50-55%  EDP ok PA systolic only 38 mmHg MAT:  Atrial arrhythmia  Now  that cath done will ask EP about plans for ablation continue beta blocker would need to hold eliquis for 48 hrs before ablation Check ECG this am  Rhythm has been somewhat incessant this hospitalization.  Have called EP PA Gaspar Bidding and discussed with Dr Caryl Comes He indicated Dr Lovena Le would f/u

## 2015-03-02 NOTE — Progress Notes (Signed)
O2 sat: 86% on RA. O2 sat: 91% 2L Cato Mulligan RN

## 2015-03-02 NOTE — Progress Notes (Signed)
Subjective: She can feel when her HR is elevated.  Sometimes SOB.  No dizziness.   Objective: Vital signs in last 24 hours: Temp:  [98.1 F (36.7 C)-98.5 F (36.9 C)] 98.1 F (36.7 C) (05/06 0955) Pulse Rate:  [61-123] 121 (05/06 0955) Resp:  [16-18] 18 (05/06 0449) BP: (95-123)/(65-79) 96/70 mmHg (05/06 0955) SpO2:  [90 %-95 %] 94 % (05/06 0449) Last BM Date: 02/28/15  Intake/Output from previous day: 05/05 0701 - 05/06 0700 In: 840 [P.O.:840] Out: -  Intake/Output this shift: Total I/O In: 360 [P.O.:360] Out: -   Medications Scheduled Meds: . apixaban  5 mg Oral BID  . feeding supplement (RESOURCE BREEZE)  1 Container Oral TID BM  . furosemide  20 mg Oral Daily  . isosorbide dinitrate  10 mg Oral TID  . metoprolol  25 mg Oral BID  . multivitamin with minerals  1 tablet Oral Daily  . polyethylene glycol  8.5 g Oral Daily  . potassium chloride  40 mEq Oral Daily  . sodium chloride  3 mL Intravenous Q12H   Continuous Infusions:  PRN Meds:.sodium chloride, acetaminophen, chlorpheniramine-HYDROcodone, gi cocktail, morphine injection, ondansetron (ZOFRAN) IV, ondansetron, sodium chloride  PE: General appearance: alert, cooperative and no distress Lungs: clear to auscultation bilaterally Heart: regular rate and rhythm and 1/6 sys MM Extremities: No LEE Pulses: 2+ and symmetric Skin: Warm and dry Neurologic: Grossly normal  Lab Results:  No results for input(s): WBC, HGB, HCT, PLT in the last 72 hours. 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  She continues to have  frequent/ prolonged episodes. Currently in NSR in 67s.  Rates in the 120s. Symptomatic with fatigue. Unable to tolerate high doses of BB therapy due to bradycardia with rates in the 40s. Currently on 25 mg of Lopressor BID. Pt had refused SVT ablation in the past but, after further discussion, she is now open to this approach. Dr. Lovena Le saw in consult: "recurrent SVT which is symptomatic and poorly tolerated. She is not a good candidate for AA drug therapy and has already had profoundly prolonged QT interval with azithromycin. He discussed the treatment options with the patient and the risks/benefits of catheter ablation but patient wanted to think this over."   She is willing to proceed with catheter ablation for symptomatic recurrent SVT.   SP left heart cath revealing normal coronary arteries.  Right heart cath with mild to moderate pulm HTN.  PSVT during the [place].   Normal EF.    LOS: 5 days    HAGER, BRYAN PA-C 03/02/2015 10:40 AM  EP Attending  Patient seen and examined. Agree with above. The patient has had recurrent atrial tachycardia/SVT, refractory to medical therapy and is now willing to proceed with EP Study and catheter ablation. I have discussed the risks/benefits/goals/expectations of  ablation with the patient and her family and they wish to proceed.  Mikle Bosworth.D.

## 2015-03-02 NOTE — Progress Notes (Signed)
PT Evaluation Addendum for G-Codes    Mar 21, 2015 1045  PT G-Codes **NOT FOR INPATIENT CLASS**  Functional Assessment Tool Used Clinical judgement  Functional Limitation Mobility: Walking and moving around  Mobility: Walking and Moving Around Current Status [phone]) CJ  Mobility: Walking and Moving Around Goal Status [phone]) CJ   Rolinda Roan, PT, DPT Acute Rehabilitation Services Pager: [phone]

## 2015-03-02 NOTE — Interval H&P Note (Signed)
History and Physical Interval Note:  03/02/2015 3:58 PM  [name] [name]  has presented today for surgery, with the diagnosis of svt  The various methods of treatment have been discussed with the patient and family. After consideration of risks, benefits and other options for treatment, the patient has consented to  Procedure(s): SVT Ablation (N/A) as a surgical intervention .  The patient's history has been reviewed, patient examined, no change in status, stable for surgery.  I have reviewed the patient's chart and labs.  Questions were answered to the patient's satisfaction.     Cristopher Peru

## 2015-03-02 NOTE — CV Procedure (Signed)
SURGEON: Cristopher Peru, MD   PREPROCEDURE DIAGNOSIS: SVT   POSTPROCEDURE DIAGNOSIS: Classic AV nodal reentrant tachycardia  PROCEDURES:  1. Comprehensive EP study.  2. Coronary sinus pacing and recording.  3. Mapping of supraventricular tachycardia.  4. Radiofrequency ablation of supraventricular tachycardia.  5. Arrhythmia induction with isuprel infused   INTRODUCTION: [name] is a 81 y.o. female with a history of symptomatic recurrent SVT who presents today for EP study and radiofrequency ablation. The patient has had recurrent symptomatic SVT lasting for many hours at a time. She has failed medical therapy with beta blockers and calcium channel blockers. She now presents for EP study and radiofrequency ablation of SVT.   DESCRIPTION OF PROCEDURE: Informed written consent was obtained and the patient was brought to the Electrophysiology Lab in the fasting state. The patient was adequately sedated with intravenous medication as outlined in the anesthesia report. The patient's right neck and groin was prepped and draped in the usual sterile fashion by the EP Lab staff. Two 6-French and one 8-French hemostasis sheaths were placed  into the right common femoral vein. A 6-French quadripolar Josephson catheter was introduced through the right common femoral vein and advanced into the His bundle region. A 6 french Hexapolar catheter was inserted into the right femoral vein and advanced into the coronary sinus.   Presenting Measurements: The patient presented to the Electrophysiology Lab in sinus rhythm. The PR interval was 187 msec with a QRS of 94 msec and a Qt of 451 msec. The average RR interval was 966 msec. The AH interval was 99 msec and the HV interval was 25milliseconds.   EP study:  Ventricular pacing was performed which reveals midline concentric decremental VA conduction with a single retrograde jump but no echo beats of tachycardias. The VA WCL was 380 msec and a VERP 600/34msec.   Rapid atrial pacing was performed with reveals PR >> RR with tachycardia induced at 440 msec. AEST was performed which revealed multiple prolonged AH jumps with echo beats. Tachycardia was not induced. The AVNERP was 600/354msec. Rapid pacing would always induce SVT at 440 ms. This was a reproducible event. V pacing was performed during tachycardia which revealed a VAV response.  The patient was therefore felt to have classic AV nodal reentrant tachycardia. I therefore elected to perform slow pathway modification. Because of concerns for the creation of complete heart block, 3-dimensional electro anatomic mapping was then utilized to map the earliest atrial activation in the right atrium and coronary sinus. This confirmed AV node reentrant tachycardia.   Ablation:  A 10F 34mm ablation catheter with electro anatomic mapping was therefore advanced through the right femoral vein and advanced into the right atrium. Mapping of Koch's triangle was performed which revealed a large kochs triangle. A single lesion was delivered at site 9 in Koch's triangle with a target temperature of 60 degrees of 50 watts for 60 seconds. Good accelerated junctional rhythm was observed with intact VA conduction. A second and third RF lesion were applied at site 8 in Koch's triangle. Accelerated junctional rhythm was again observed however RF was discontinued after a single dropped retrograde beat.   Measurements following ablation:  Following ablation, Rapid atrial pacing was again performed with PR<RR and an AV WCL of 450 msec. AEST was performed which revealed a single AH jump but no echo beats and no tachycardia observed. The AVN ERP was 600/300 msec. V pacing was performed which revealed midline concentric decremental VA conduction with a single retrograde jump  but no echo beats of tachycardias. The VA WCL was 400 msec and a VERP 600/372msec. There was a single retrograde jump with echo beat but no arrhythmias induced.  RAP was  carried out which revealed PR<RR with an AVWCL of 440 msec.  AEST was performed which revealed no AH jumps, echo beats, or tachycardias induced.  No arrhythmias were induced. Following ablation the AH interval was 129 msec with an HV interval of 56 msec. The procedure was therefore considered completed. All catheters were removed and the sheaths were aspirated and flushed. The sheaths were removed and hemostasis was assured. EBL<10ml. There were no early apparent complications.   CONCLUSIONS:  1. Sinus rhythm upon presentation.  2. The patient had dual AV nodal physiology with easily inducible classic AV nodal reentrant tachycardia, there were no other accessory pathways or arrhythmias induced  3. Successful radiofrequency modification of the slow AV nodal pathway  4. No inducible arrhythmias following ablation.  5. No early apparent complications.   Mikle Bosworth.D.

## 2015-03-02 NOTE — Progress Notes (Addendum)
[facility] Progress Note   [name] CNO:[id number] DOB: Jun 21, 1934 DOA: 02/25/2015 PCP: Loura Pardon, MD  Brief narrative: [name] is a 81 y.o. female with past medical history of PSVT, HTN, DVT, PE,  pulm HTN (mod to severe), right heart failure, OSA on CPAP presents with chest pain and was found to have A-fib with HR of 122 She was admitted 2 weeks ago with similar complaints and was found to have a severely prolonged QT interval. She underwent a stress test which was negative. She was treated for community-acquired pneumonia and discharged on 4/21 on oral antibiotics. She continued to have some symptoms of DOE and recent high resolution CT of the chest was performed on 4/28 which revealed air bronchograms in the right lower lobe suggestive of pneumonia with some mild bronchiectasis in the right lower lobe. Also noted was cardiomegaly with RV and RA dilated patient. She has had to increase her O2 use at home. She was previously only using it at night but has had been using it during the daytime at home for the past 2 month.  She overall feels weak and tired all the time and this is a great source of anxiety for her. Repeat chest x-ray on admission reveals continued bronchitic changes. The patient does not complain of cough. She has had no fever and no leukocytosis to suggest that she has a pneumonia at this time.   Subjective: Evaluated this AM. She has not complaints of palpitations or chest pain. She has an ongoing mild cough. No fevers.   Assessment/Plan: Principal Problem: SVT/Atrial fibrillation/mildly elevated troponin - Per EP notes, an ablation is recommended- she has refused an ablation in the past but is not agreeable- Medical management not recommended as she had a significantly prolonged QT when treated with Azithromycin in the recent past for a respiratory infection -on apixaban-  Active Problems: Pulmonary hypertension (mild to moderate), right heart failure-  chronic -may be in relation to obstructive sleep apnea,PEs in the past and maybe also obesity hypoventilation syndrome -last echo revealed severely dilated RV moderately to severely reduced function and moderate TR. PA peak pressure was 57 -left and right heart cath performed on 5/4- no significant CAD seen, mild RCA stenosis note- EF 50-55%, mild to mod pulm HTN -continue low-dose furosemide - Isordil added by cardiology     Acute on chronic respiratory failure with hypoxia -etiology for hypoxia not fully determined-recent high-resolution CT negative for etiology-may be in relation to pulmonary hypertension versus obesity hypoventilation syndrome -2 L of oxygen is keeping her pulse ox at 94- 98%  Mild AKI on CKD3 -Baseline creatinine is about 0.9 to 1.0 -creatinine was slightly elevated at 1.12 on admission but has improved to 0.99    Essential hypertension -continue metoprolol and Isordil-     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 -Poor short-term memory, poor historian   Code Status: DNR Family Communication: with husband Disposition Plan: return to SNF when stable DVT prophylaxis: on apixaban  Consultants:cardiology Procedures: Cardiac cath  Antibiotics: Anti-infectives    None      Objective: Filed Weights   02/25/15 0544 02/25/15 1154 02/28/15 0600  Weight: 99.791 kg (220 lb) 100.1 kg (220 lb 10.9 oz) 100.1 kg (220 lb 10.9 oz)    Intake/Output Summary (Last 24 hours) at 03/02/15 1125 Last  data filed at 03/02/15 0730  Gross per 24 hour  Intake    960 ml  Output      0 ml  Net    960 ml     Vitals Filed Vitals:   03/01/15 1915 03/01/15 1959 03/02/15 0449 03/02/15 0955  BP:  95/67 110/79 96/70  Pulse: 120 123 115 121  Temp:  98.4 F (36.9 C) 98.2 F (36.8 C) 98.1 F (36.7 C)  TempSrc:  Oral Oral Oral  Resp:   18 18   Height:      Weight:      SpO2: 90% 93% 94%     Exam:  General:  Pt is alert, not in acute distress  HEENT: No icterus, No thrush  Cardiovascular: regular rate and rhythm, S1/S2 No murmur-  Respiratory: clear to auscultation bilaterally   Abdomen: Soft, +Bowel sounds, non tender, non distended, no guarding  MSK: No LE edema, cyanosis or clubbing  Data Reviewed: Basic Metabolic Panel:  Recent Labs Lab 02/25/15 0559 02/26/15 0836 02/28/15 1419  NA 137 138 138  K 4.1 4.5 4.4  CL 94* 93* 97*  CO2 35* 38* 34*  GLUCOSE 112* 120* 125*  BUN 19 19 19   CREATININE 1.12* 1.07* 0.99  CALCIUM 9.3 8.9 8.3*   Liver Function Tests: No results for input(s): AST, ALT, ALKPHOS, BILITOT, PROT, ALBUMIN in the last 168 hours. No results for input(s): LIPASE, AMYLASE in the last 168 hours. No results for input(s): AMMONIA in the last 168 hours. CBC:  Recent Labs Lab 02/25/15 0559  WBC 6.2  NEUTROABS 4.3  HGB 12.7  HCT 42.9  MCV 98.8  PLT 153   Cardiac Enzymes:  Recent Labs Lab 02/25/15 0827 02/25/15 1443 02/25/15 2021  TROPONINI 0.04* 0.03 0.04*   BNP (last 3 results) No results for input(s): BNP in the last 8760 hours.  ProBNP (last 3 results)  Recent Labs  02/19/15 1527  PROBNP 1306.0*    CBG: No results for input(s): GLUCAP in the last 168 hours.  No results found for this or any previous visit (from the past 240 hour(s)).   Studies: No results found.  Scheduled Meds:  Scheduled Meds: . apixaban  5 mg Oral BID  . feeding supplement (RESOURCE BREEZE)  1 Container Oral TID BM  . furosemide  20 mg Oral Daily  . isosorbide dinitrate  10 mg Oral TID  . metoprolol  25 mg Oral BID  . multivitamin with minerals  1 tablet Oral Daily  . polyethylene glycol  8.5 g Oral Daily  . potassium chloride  40 mEq Oral Daily  . sodium chloride  3 mL Intravenous Q12H   Continuous Infusions:    Time spent on care of this patient: 35  minutes   [place], MD 03/02/2015, 11:25 AM  LOS: 5 days   [facility] Office  [phone] Pager - Text Page per [email or web address]  If 7PM-7AM, please contact night-coverage [email or web address]

## 2015-03-02 NOTE — Progress Notes (Signed)
Awaiting stability for d/c to SNF per MD. Patient discussed with Dr. Wynelle Cleveland. Unsure at this time of tentative d/c date. Patient has agreed to Ablation per MD note and d/c will depend on when this procedure is completed.  CSW spoke with Levi Aland- Admissions at [facility]. She stated that all the admission paperwork has been completed and she will accept patient over the weekend if indicated. Weekend CSW will need to call [place] [phone] to notify if d/c'd over the weekend. Will provide weekend handoff re: above. Lorie Phenix. Pauline Good, [place]

## 2015-03-02 NOTE — H&P (View-Only) (Signed)
Subjective: She can feel when her HR is elevated.  Sometimes SOB.  No dizziness.   Objective: Vital signs in last 24 hours: Temp:  [98.1 F (36.7 C)-98.5 F (36.9 C)] 98.1 F (36.7 C) (05/06 0955) Pulse Rate:  [61-123] 121 (05/06 0955) Resp:  [16-18] 18 (05/06 0449) BP: (95-123)/(65-79) 96/70 mmHg (05/06 0955) SpO2:  [90 %-95 %] 94 % (05/06 0449) Last BM Date: 02/28/15  Intake/Output from previous day: 05/05 0701 - 05/06 0700 In: 840 [P.O.:840] Out: -  Intake/Output this shift: Total I/O In: 360 [P.O.:360] Out: -   Medications Scheduled Meds: . apixaban  5 mg Oral BID  . feeding supplement (RESOURCE BREEZE)  1 Container Oral TID BM  . furosemide  20 mg Oral Daily  . isosorbide dinitrate  10 mg Oral TID  . metoprolol  25 mg Oral BID  . multivitamin with minerals  1 tablet Oral Daily  . polyethylene glycol  8.5 g Oral Daily  . potassium chloride  40 mEq Oral Daily  . sodium chloride  3 mL Intravenous Q12H   Continuous Infusions:  PRN Meds:.sodium chloride, acetaminophen, chlorpheniramine-HYDROcodone, gi cocktail, morphine injection, ondansetron (ZOFRAN) IV, ondansetron, sodium chloride  PE: General appearance: alert, cooperative and no distress Lungs: clear to auscultation bilaterally Heart: regular rate and rhythm and 1/6 sys MM Extremities: No LEE Pulses: 2+ and symmetric Skin: Warm and dry Neurologic: Grossly normal  Lab Results:  No results for input(s): WBC, HGB, HCT, PLT in the last 72 hours. 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  She continues to have  frequent/ prolonged episodes. Currently in NSR in 65s.  Rates in the 120s. Symptomatic with fatigue. Unable to tolerate high doses of BB therapy due to bradycardia with rates in the 40s. Currently on 25 mg of Lopressor BID. Pt had refused SVT ablation in the past but, after further discussion, she is now open to this approach. Dr. Lovena Le saw in consult: "recurrent SVT which is symptomatic and poorly tolerated. She is not a good candidate for AA drug therapy and has already had profoundly prolonged QT interval with azithromycin. He discussed the treatment options with the patient and the risks/benefits of catheter ablation but patient wanted to think this over."   She is willing to proceed with catheter ablation for symptomatic recurrent SVT.   SP left heart cath revealing normal coronary arteries.  Right heart cath with mild to moderate pulm HTN.  PSVT during the [place].   Normal EF.    LOS: 5 days    HAGER, BRYAN PA-C 03/02/2015 10:40 AM  EP Attending  Patient seen and examined. Agree with above. The patient has had recurrent atrial tachycardia/SVT, refractory to medical therapy and is now willing to proceed with EP Study and catheter ablation. I have discussed the risks/benefits/goals/expectations of  ablation with the patient and her family and they wish to proceed.  Mikle Bosworth.D.

## 2015-03-02 NOTE — Progress Notes (Signed)
Physical Therapy Treatment Patient Details Name: [name] MRN: [id number] DOB: 12/24/1933 Today's Date: 03/02/2015    History of Present Illness Pt is a 81 y/o female with multiple medical problems who was recently discharged on 4/21 after admission for PNA. Since d/c pt has not improved back to baseline of function. Pt presents back to the ED with SOB and CP.     PT Comments    Pt progressing towards physical therapy goals. Was able to perform transfers and ambulation with close guard or occasional min assist. Overall tolerance for functional activity is low, and feel that pt continues to be appropriate for SNF placement to gain strength and independence prior to return home. Will continue to follow.   Follow Up Recommendations  SNF;Supervision/Assistance - 24 hour     Equipment Recommendations  None recommended by PT    Recommendations for Other Services       Precautions / Restrictions Precautions Precautions: Fall Restrictions Weight Bearing Restrictions: No    Mobility  Bed Mobility Overal bed mobility: Needs Assistance Bed Mobility: Supine to Sit     Supine to sit: Min guard     General bed mobility comments: Increased time and use of bed rails required for pt to transition to EOB.   Transfers Overall transfer level: Needs assistance Equipment used: Rolling walker (2 wheeled) Transfers: Sit to/from Stand Sit to Stand: Min guard         General transfer comment: Close guard for safety as pt powered up to full standing position. No physical assistance required.   Ambulation/Gait Ambulation/Gait assistance: Min assist Ambulation Distance (Feet): 75 Feet Assistive device: Rolling walker (2 wheeled) Gait Pattern/deviations: Step-through pattern;Decreased stride length;Trunk flexed Gait velocity: Decreased Gait velocity interpretation: Below normal speed for age/gender General Gait Details: Pt generally moving slow. Pt fatigued quickly and required  standing rest breaks. Occasional assist required for walker direction and pt support.    Stairs            Wheelchair Mobility    Modified Rankin (Stroke Patients Only)       Balance Overall balance assessment: Needs assistance Sitting-balance support: Feet supported;No upper extremity supported Sitting balance-Leahy Scale: Fair     Standing balance support: Bilateral upper extremity supported;During functional activity Standing balance-Leahy Scale: Poor                      Cognition Arousal/Alertness: Awake/alert Behavior During Therapy: WFL for tasks assessed/performed Overall Cognitive Status: Within Functional Limits for tasks assessed       Memory: Decreased short-term memory              Exercises      General Comments        Pertinent Vitals/Pain Pain Assessment: No/denies pain    Home Living                      Prior Function            PT Goals (current goals can now be found in the care plan section) Acute Rehab PT Goals Patient Stated Goal: to go to rehab and get strong PT Goal Formulation: With patient/family Time For Goal Achievement: 03/12/15 Potential to Achieve Goals: Good Progress towards PT goals: Progressing toward goals    Frequency  Min 2X/week    PT Plan Current plan remains appropriate    Co-evaluation             End of Session  Equipment Utilized During Treatment: Gait belt;Oxygen Activity Tolerance: Patient limited by fatigue Patient left: in chair;with call bell/phone within reach;with family/visitor present     Time: 3382-5053 PT Time Calculation (min) (ACUTE ONLY): 24 min  Charges:  $Gait Training: 8-22 mins $Therapeutic Activity: 8-22 mins                    G Codes:      Rolinda Roan 03/18/2015, 3:06 PM   Rolinda Roan, PT, DPT Acute Rehabilitation Services Pager: [phone]

## 2015-03-03 ENCOUNTER — Observation Stay (HOSPITAL_COMMUNITY): Payer: Medicare Other

## 2015-03-03 DIAGNOSIS — R0789 Other chest pain: Secondary | ICD-10-CM | POA: Diagnosis not present

## 2015-03-03 DIAGNOSIS — R079 Chest pain, unspecified: Secondary | ICD-10-CM | POA: Diagnosis not present

## 2015-03-03 DIAGNOSIS — I471 Supraventricular tachycardia: Secondary | ICD-10-CM | POA: Diagnosis not present

## 2015-03-03 DIAGNOSIS — I270 Primary pulmonary hypertension: Secondary | ICD-10-CM | POA: Diagnosis not present

## 2015-03-03 DIAGNOSIS — Z86711 Personal history of pulmonary embolism: Secondary | ICD-10-CM | POA: Diagnosis not present

## 2015-03-03 MED ORDER — ACETAMINOPHEN 325 MG PO TABS: 650.0000 mg | ORAL_TABLET | ORAL | Status: DC | PRN

## 2015-03-03 NOTE — Progress Notes (Signed)
SATURATION QUALIFICATIONS: (This note is used to comply with regulatory documentation for home oxygen)  Patient Saturations on Room Air at Rest = 86%  Patient Saturations on Room Air while Ambulating = 83%  Patient Saturations on 2 Liters of oxygen while Ambulating =89 %  Please briefly explain why patient needs home oxygen: medically needed

## 2015-03-03 NOTE — Progress Notes (Signed)
Called SNF to give report with no answer. Cato Mulligan RN

## 2015-03-03 NOTE — Discharge Summary (Addendum)
Physician Discharge Summary  [name] XTG:[id number] DOB: May 18, 1934 DOA: 02/25/2015  PCP: Loura Pardon, MD  Admit date: 02/25/2015 Discharge date: 03/03/2015  Time spent: 55 minutes  Recommendations for Outpatient Follow-up:  1. Transition to SNF 2. Follow up on hypoxia 3. Daily weights-b met in 1 week 4. Follow-up with Dr. 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 female with past medical history of PSVT, HTN, DVT, PE, pulm HTN (mod to severe), right heart failure, OSA on CPAP presents with chest pain and was found to have A-fib with HR of 122 She was admitted 2 weeks ago with similar complaints and was found to have a severely prolonged QT interval. She underwent a stress test which was negative. She was treated for community-acquired pneumonia and discharged on 4/21 on oral antibiotics. She continued to have some symptoms of DOE and recent high resolution CT of the chest was performed on 4/28 which revealed air bronchograms in the right lower lobe suggestive of pneumonia with some mild bronchiectasis in the right lower lobe. Also noted was cardiomegaly with RV and RA dilated patient. She has had to increase her O2 use at home. She was previously only using it at night but has had been using it during the daytime at home for the past 2 month. She overall feels weak and tired all the time and this is a great source of anxiety for her. Repeat chest x-ray on admission reveals continued bronchitic changes. She has had no fever and no leukocytosis to suggest that she has a pneumonia at this time. She has has  excessive episodes of atrial tachycardia/ SVT through out the hospital stay. In the past, an ablation was recommended but she declined it. During this admission she agreed to the ablation.   Hospital Course:  Principal Problem: Atrial tachycardia -Diagnosed on last admission in April-she did not want an ablation at that time but is agreeable to it on this admission - ablation of slow AV nodal pathway performed on 5/6- currently in sinus rhythm -on apixaban which should be continued- Cardiology recommending f/u with Dr Rayann Heman in 2-4 wks  Active Problems: Chest pain on admission-Elevated troponin levels -Myoview stress test normal on 02/12/15 -left and right heart cath performed on 5/4- no significant CAD seen, mild RCA stenosis note- EF 50-55%, mild to mod pulm  HTN   Pulmonary hypertension (mild to moderate), right heart failure- chronic -may be in relation to obstructive sleep apnea,PEs in the past and maybe also obesity hypoventilation syndrome -last echo revealed severely dilated RV moderately to severely reduced function and moderate TR. PA peak pressure was 57 -continue low-dose furosemide recommended by cardiology  Chronic respiratory failure with hypoxia -Was discharged on 2 L of oxygen on 02/15/2015 when she was treated for pneumonia -etiology for continued hypoxia not fully determined-recent high-resolution CT negative for etiology- -She continues to have a mild cough and sounds congested but has not had fevers or leukocytosis-chest x-ray on admission and chest x-ray again on discharge today does not reveal any pneumonia -2 L of oxygen is keeping her pulse ox at  94- 98%- recommend continuing to follow oxygen levels-hypoxia may be related to atelectasis noted on chest x-ray today  Mild AKI on CKD3 -Baseline creatinine is about 0.9 to 1.0 -creatinine was slightly elevated at 1.12 on admission but has improved to 0.99   Essential hypertension -continue metoprolol and Isordil-     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 -Poor short-term memory, poor historian    Procedures:  Cardiac cath   Ablation  Consultations:  Gen. Cardiology  EP  Discharge Exam: Filed Weights   02/25/15 0544 02/25/15 1154 02/28/15 0600  Weight: 99.791 kg (220 lb) 100.1 kg (220 lb 10.9 oz) 100.1 kg (220 lb 10.9 oz)   Filed Vitals:   03/03/15 0427  BP: 132/82  Pulse: 70  Temp: 97.8 F (36.6 C)  Resp: 16    General: AAO x 3, no distress Cardiovascular: RRR, no murmurs  Respiratory: clear to auscultation bilaterally GI: soft, non-tender, non-distended, bowel sound positive  Discharge Instructions You were cared for by a hospitalist during your hospital stay. If you have any questions about your discharge medications or the care you received while you were in the hospital after you are discharged, you can call the unit and asked to speak with the hospitalist on call if the hospitalist that took care of you is not available. Once you are discharged, your primary care physician will handle any further medical issues. Please note that NO REFILLS for any discharge medications will be authorized once you are discharged, as it is imperative that you return to your primary care physician (or establish a relationship with a primary care physician if you do not have one) for your aftercare needs so that they can reassess your need for medications and monitor your lab values.      Discharge Instructions    Diet - low sodium heart healthy    Complete by:  As directed      Increase activity slowly    Complete by:  As directed             Medication List    STOP taking these medications        cefUROXime 250 MG tablet  Commonly known as:  CEFTIN     diphenhydrAMINE 25 mg capsule  Commonly known as:  BENADRYL      TAKE these medications         acetaminophen 325 MG tablet  Commonly known as:  TYLENOL  Take 2 tablets (650 mg total) by mouth every 4 (four) hours as needed for headache or mild pain.     apixaban 5 MG Tabs tablet  Commonly known as:  ELIQUIS  Take 1 tablet (5 mg total) by mouth 2 (two) times daily.     benzonatate 200 MG capsule  Commonly known as:  TESSALON  Take 1 capsule (200 mg total) by mouth 2 (two) times daily as needed for cough.     CALCIUM 600/VITAMIN D3 600-800 MG-UNIT Tabs  Generic drug:  Calcium Carb-Cholecalciferol  Take 1 tablet by mouth 2 (two) times daily.     furosemide 20 MG tablet  Commonly known as:  LASIX  Take 2 tablets (40 mg total) by mouth for 3 days (02/20/2015 to 02/22/2015), then back to 1 tablet (20 mg total) by mouth daily.     metoprolol 50 MG tablet  Commonly known as:  LOPRESSOR  Take 0.5 tablets (25 mg total) by mouth 2 (two) times daily.     multivitamin with minerals Tabs tablet  Take 1 tablet by mouth daily.     ondansetron 4 MG tablet  Commonly known as:  ZOFRAN  Take 1 tablet (4 mg total) by mouth every 8 (eight) hours as needed for nausea or vomiting.     OXYGEN  Inhale 2 L/min into the lungs continuous.     polyethylene glycol packet  Commonly known as:  MIRALAX / GLYCOLAX  Take 8.5 g by mouth daily.     potassium chloride 10 MEQ tablet  Commonly known as:  K-DUR  Take 1 tablet (10 mEq total) by mouth daily.     vitamin C 500 MG tablet  Commonly known as:  ASCORBIC ACID  Take 500 mg by mouth daily.       Allergies  Allergen Reactions  . Penicillins Hives  . Sulfonamide Derivatives Hives  . Zithromax [Azithromycin] Other (See Comments)    Prolonged QT on EKG  . Adhesive [Tape] Other (See Comments)    redness  . Prednisone Other (See Comments)    Causes insomnia   Follow-up Information    Follow up with Thompson Grayer, MD In 2 weeks.   Specialty:  Cardiology   Why:  you will need to call their office for appointment   Contact information:    [address] [place] [address] [phone]        The results of significant diagnostics from this hospitalization (including imaging, microbiology, ancillary and laboratory) are listed below for reference.    Significant Diagnostic Studies: Dg Chest 2 View  03/03/2015   CLINICAL DATA:  Acute shortness of breath. Cough for 2 weeks. Left shoulder pain for 1 week.  EXAM: CHEST  2 VIEW  COMPARISON:  02/25/2015.  FINDINGS: Mild to moderate enlargement of the cardiopericardial silhouette, stable. Aorta is tortuous no mediastinal or hilar masses or evidence of adenopathy.  Bibasilar lung opacity, improved on the right from the prior study. Small right pleural effusion decreased the prior study. Persistent basilar atelectasis. No pulmonary edema. No pneumothorax.  Bony thorax is demineralized but grossly intact.  IMPRESSION: 1. Lung base opacity, right greater than left. 2. Right lung base opacity appears improved from most recent prior study consistent with a combination of a decrease in right pleural fluid with decreased atelectasis. Milder, stable left basilar opacity is consistent with atelectasis. No convincing pneumonia. No pulmonary edema. 3. Stable cardiomegaly.   Electronically Signed   By: Lajean Manes M.D.   On: 03/03/2015 12:05   Dg Chest 2 View  02/25/2015   CLINICAL DATA:  Chest pain and shortness of breath.  EXAM: CHEST  2 VIEW  COMPARISON:  Chest radiograph February 10, 2015 and CT of the chest February 22, 2015  FINDINGS: The cardiac silhouette is moderately enlarged, unchanged. Tortuous aorta. Small to moderate RIGHT pleural effusion was present previously. Similar bronchitic changes. Minimal biapical pleural thickening. No pneumothorax. Soft tissue planes and included osseous structure nonsuspicious.  IMPRESSION: Stable cardiomegaly. Similar bronchitic changes with small to moderate RIGHT pleural effusion. Recommend followup chest radiograph after treatment to verify  improvement.   Electronically Signed   By: Elon Alas   On: 02/25/2015 06:51   Ct Chest High Resolution  02/22/2015   CLINICAL DATA:  81 year old female with shortness breath on exertion. History of pulmonary hypertension. Cough. Evaluate for interstitial lung disease.  EXAM: CT CHEST WITHOUT  CONTRAST  TECHNIQUE: Multidetector CT imaging of the chest was performed following the standard protocol without intravenous contrast. High resolution imaging of the lungs, as well as inspiratory and expiratory imaging, was performed.  COMPARISON:  Chest CT 03/13/2014.  FINDINGS: Mediastinum/Lymph Nodes: Heart size is mildly enlarged, particularly the right atrium and right ventricle. Small amount of pericardial fluid and/or thickening, unlikely to be of any hemodynamic significance at this time. No associated pericardial calcification. There is atherosclerosis of the thoracic aorta, the great vessels of the mediastinum and the coronary arteries, including calcified atherosclerotic plaque in the left anterior descending, left circumflex and right coronary arteries. Mild dilatation of the pulmonic trunk (3.4 cm in diameter), compatible with the reported clinical history of pulmonary arterial hypertension. No pathologically enlarged mediastinal or hilar lymph nodes. Please note that accurate exclusion of hilar adenopathy is limited on noncontrast CT scans. Esophagus is unremarkable in appearance. No axillary lymphadenopathy.  Lungs/Pleura: Small right pleural effusion lying dependently. Small amount of airspace consolidation in the anterior aspect of the right lower lobe where there are some air bronchograms. No suspicious appearing pulmonary nodules or masses. High-resolution images demonstrate no significant regions of ground-glass attenuation, subpleural reticulation, parenchymal banding or frank honeycombing. There is some mild cylindrical bronchiectasis noted in the right lower lobe. Inspiratory and expiratory  imaging is unremarkable.  Upper Abdomen: Status post cholecystectomy.  Musculoskeletal/Soft Tissues: Old healed fracture of the mid sternum with mild posttraumatic deformity, unchanged. Chronic compression fracture of T12 with approximately 20% loss of anterior vertebral body height also unchanged.  IMPRESSION: 1. No findings to suggest interstitial lung disease at this time. 2. Small amount of airspace consolidation with air bronchograms in the right lower lobe, suggestive of pneumonia. There is also some very mild cylindrical bronchiectasis in the right lower lobe, and a small right-sided parapneumonic pleural effusion. 3. Atherosclerosis, including three vessel coronary artery disease. Assessment for potential risk factor modification, dietary therapy or pharmacologic therapy may be warranted, if clinically indicated. 4. Cardiomegaly with right ventricular and right atrial dilatation, as well as mild dilatation of the pulmonic trunk; imaging findings compatible with the reported clinical history of pulmonary arterial hypertension.   Electronically Signed   By: Vinnie Langton M.D.   On: 02/22/2015 09:35   Nm Myocar Multi W/spect W/wall Motion / Ef  02/12/2015   CLINICAL DATA:  Ms. Eliberto Ivory is a 81 year old female who is admitted to the hospital with episodes of chest pain. She was scheduled for a [place] study for further evaluation.  EXAM: MYOCARDIAL IMAGING WITH SPECT (REST AND PHARMACOLOGIC-STRESS)  GATED LEFT VENTRICULAR WALL MOTION STUDY  LEFT VENTRICULAR EJECTION FRACTION  TECHNIQUE: Standard myocardial SPECT imaging was performed after resting intravenous injection of 10 mCi Tc-36m sestamibi. Subsequently, intravenous infusion of Lexiscan was performed under the supervision of the Cardiology staff. At peak effect of the drug, 30 mCi Tc-50m sestamibi was injected intravenously and standard myocardial SPECT imaging was performed. Quantitative gated imaging was also performed to evaluate left  ventricular wall motion, and estimate left ventricular ejection fraction.  COMPARISON:  None.  FINDINGS: Review of the EKG tracings reveals no significant ST or T wave changes.  Raw data images:  There is no significant motion artifact.  Stress images: There is smooth and homogeneous uptake in all areas of the myocardium.  Resting images: Smooth and homogeneous uptake in all areas of the myocardium. There is no evidence of ischemia or infarction.  Wall Motion:  The left ventricle systolic function is normal.  Left Ventricular Ejection Fraction: 57 %  End diastolic volume 38 ml  End systolic volume 16 ml  IMPRESSION: 1.  Normal Lexiscan Myoview study.  2.  The left ventricle systolic function is normal  3. Left ventricular ejection fraction 57%  *2012 Appropriate Use Criteria for Coronary Revascularization Focused Update: J Am Coll Cardiol. 3009;23(3):007-622. http://content.airportbarriers.com.aspx?articleid=1201161  Mertie Moores, MD, [facility]   Electronically Signed   By: Mertie Moores   On: 02/12/2015 16:45   Dg Chest Port 1 View  02/10/2015   CLINICAL DATA:  Acute onset of weakness. History of right ventricular failure and hypertension.  EXAM: PORTABLE CHEST - 1 VIEW  COMPARISON:  05/30/2014  FINDINGS: The heart is mildly enlarged. There is tortuosity and mild ectasia of the thoracic aorta. There are chronic emphysematous and bronchitic lung changes but no acute overlying pulmonary process. No pleural effusion. The bony thorax is intact.  IMPRESSION: Mild cardiac enlargement.  Chronic lung changes but no acute overlying pulmonary process.   Electronically Signed   By: Marijo Sanes M.D.   On: 02/10/2015 13:25    Microbiology: No results found for this or any previous visit (from the past 240 hour(s)).   Labs: Basic Metabolic Panel:  Recent Labs Lab 02/25/15 0559 02/26/15 0836 02/28/15 1419 03/02/15 2015  NA 137 138 138  --   K 4.1 4.5 4.4  --   CL 94* 93* 97*  --   CO2 35* 38* 34*  --    GLUCOSE 112* 120* 125*  --   BUN $Re'19 19 19  'Wmw$ --   CREATININE 1.12* 1.07* 0.99 1.04*  CALCIUM 9.3 8.9 8.3*  --    Liver Function Tests: No results for input(s): AST, ALT, ALKPHOS, BILITOT, PROT, ALBUMIN in the last 168 hours. No results for input(s): LIPASE, AMYLASE in the last 168 hours. No results for input(s): AMMONIA in the last 168 hours. CBC:  Recent Labs Lab 02/25/15 0559 03/02/15 2015  WBC 6.2 5.6  NEUTROABS 4.3  --   HGB 12.7 12.0  HCT 42.9 40.2  MCV 98.8 99.3  PLT 153 180   Cardiac Enzymes:  Recent Labs Lab 02/25/15 0827 02/25/15 1443 02/25/15 2021  TROPONINI 0.04* 0.03 0.04*   BNP: BNP (last 3 results) No results for input(s): BNP in the last 8760 hours.  ProBNP (last 3 results)  Recent Labs  02/19/15 1527  PROBNP 1306.0*    CBG: No results for input(s): GLUCAP in the last 168 hours.     SignedDebbe Odea, MD [facility] 03/03/2015, 1:27 PM

## 2015-03-03 NOTE — Progress Notes (Addendum)
Patient for d/c today to SNF bed at [facility]. Daughter, [name] and patient agreeable to this plan- plan transfer via EMS per family choice given her oxygen and mobility needs.  Eduard Clos, MSW, [place] Weekend Coverage [phone]

## 2015-03-03 NOTE — Progress Notes (Signed)
Primary cardiologist: Dr. Thompson Grayer  Seen for followup: Atrial tachycardia  Subjective:    No complaints this morning, no chest pain or palpitations specifically.  Objective:   Temp:  [97.3 F (36.3 C)-98.2 F (36.8 C)] 97.8 F (36.6 C) (05/07 0427) Pulse Rate:  [0-295] 70 (05/07 0427) Resp:  [0-30] 16 (05/07 0427) BP: (96-136)/(67-90) 132/82 mmHg (05/07 0427) SpO2:  [0 %-97 %] 93 % (05/07 0427) Last BM Date: 03/01/15  Filed Weights   02/25/15 0544 02/25/15 1154 02/28/15 0600  Weight: 220 lb (99.791 kg) 220 lb 10.9 oz (100.1 kg) 220 lb 10.9 oz (100.1 kg)    Intake/Output Summary (Last 24 hours) at 03/03/15 0940 Last data filed at 03/02/15 2331  Gross per 24 hour  Intake      0 ml  Output    401 ml  Net   -401 ml    Telemetry: Normal sinus rhythm.  Exam:  General: Appears comfortable at rest.  Lungs: Clear, nonlabored.  Cardiac: RRR, no gallop.  Extremities: No pitting edema.  Lab Results:  Basic Metabolic Panel:  Recent Labs Lab 02/25/15 0559 02/26/15 0836 02/28/15 1419 03/02/15 2015  NA 137 138 138  --   K 4.1 4.5 4.4  --   CL 94* 93* 97*  --   CO2 35* 38* 34*  --   GLUCOSE 112* 120* 125*  --   BUN 19 19 19   --   CREATININE 1.12* 1.07* 0.99 1.04*  CALCIUM 9.3 8.9 8.3*  --     CBC:  Recent Labs Lab 02/25/15 0559 03/02/15 2015  WBC 6.2 5.6  HGB 12.7 12.0  HCT 42.9 40.2  MCV 98.8 99.3  PLT 153 180    Cardiac Enzymes:  Recent Labs Lab 02/25/15 0827 02/25/15 1443 02/25/15 2021  TROPONINI 0.04* 0.03 0.04*    Echocardiogram 02/12/2015: Study Conclusions  - Left ventricle: Paradoxical septal motiom. The cavity size was normal. Wall thickness was increased in a pattern of mild LVH. The estimated ejection fraction was 55%. - Mitral valve: There was mild regurgitation. - Right ventricle: The cavity size was severely dilated. Systolic function was moderately to severely reduced. - Tricuspid valve: There was moderate  regurgitation. - Pulmonary arteries: PA peak pressure: 57 mm Hg (S). - Pericardium, extracardiac: A small pericardial effusion was identified posterior to the heart.   Medications:   Scheduled Medications: . apixaban  5 mg Oral BID  . feeding supplement (RESOURCE BREEZE)  1 Container Oral TID BM  . furosemide  20 mg Oral Daily  . isosorbide dinitrate  10 mg Oral TID  . metoprolol  25 mg Oral BID  . multivitamin with minerals  1 tablet Oral Daily  . polyethylene glycol  8.5 g Oral Daily  . potassium chloride  40 mEq Oral Daily  . sodium chloride  3 mL Intravenous Q12H  . sodium chloride  3 mL Intravenous Q12H    PRN Medications: sodium chloride, sodium chloride, acetaminophen, acetaminophen, chlorpheniramine-HYDROcodone, gi cocktail, morphine injection, ondansetron (ZOFRAN) IV, ondansetron, sodium chloride, sodium chloride   Assessment:   1. 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 Lovena Le 03/02/15.   2. History of chest pain with cardiac catheterization 02/28/15 showing no significant obstructive CAD.  3. History of pulmonary emboli, on Eliquis.  4. Pulmonary hypertension, mild to moderate at cardiac catheterization.   Plan/Discussion:    Reviewed current medications. Would stop Isordil in light of no significant obstructive CAD at heart catheterization. Plan to continue  low-dose Lopressor, she is back on Eliquis as well. Note that SNF discharge anticipated [facility]). Would have her follow-up with Dr. Rayann Heman or APP in the next 2-4 weeks.   Satira Sark, M.D., F.A.C.C.

## 2015-03-03 NOTE — Clinical Social Work Placement (Signed)
   CLINICAL SOCIAL WORK PLACEMENT  NOTE  Date:  03/03/2015  Patient Details  Name: MAKIYAH ZENTZ MRN: [id number] Date of Birth: 11-26-1933  Clinical Social Work is seeking post-discharge placement for this patient at the [place] level of care (*CSW will initial, date and re-position this form in  chart as items are completed):  Yes   Patient/family provided with [place] Work Department's list of facilities offering this level of care within the geographic area requested by the patient (or if unable, by the patient's family).  Yes   Patient/family informed of their freedom to choose among providers that offer the needed level of care, that participate in Medicare, Medicaid or managed care program needed by the patient, have an available bed and are willing to accept the patient.  Yes   Patient/family informed of [facility]'s ownership interest in [facility] and [facility], as well as of the fact that they are under no obligation to receive care at these facilities.  PASRR submitted to EDS on       PASRR number received on       Existing PASRR number confirmed on 02/27/15     FL2 transmitted to all facilities in geographic area requested by pt/family on       FL2 transmitted to all facilities within larger geographic area on 02/27/15     Patient informed that his/her managed care company has contracts with or will negotiate with certain facilities, including the following:            Patient/family informed of bed offers received.03/02/15    Patient chooses bed at    [facility]    Physician recommends and patient chooses bed at      Patient to be transferred to  [facility]    on   03/03/15/.  Patient to be transferred to facility by  family Patient family notified on   03/03/15/ of transfer.  Name of family member notified:    [name], daughter     PHYSICIAN Please sign FL2, Please prepare priority discharge summary, including  medications     Additional Comment:    _______________________________________________ Ludwig Clarks, [place] 03/03/2015, 1:57 PM

## 2015-03-05 ENCOUNTER — Non-Acute Institutional Stay (SKILLED_NURSING_FACILITY): Payer: Medicare Other | Admitting: Registered Nurse

## 2015-03-05 ENCOUNTER — Encounter (HOSPITAL_COMMUNITY): Payer: Self-pay | Admitting: Internal Medicine

## 2015-03-05 DIAGNOSIS — B372 Candidiasis of skin and nail: Secondary | ICD-10-CM

## 2015-03-05 DIAGNOSIS — I471 Supraventricular tachycardia, unspecified: Secondary | ICD-10-CM

## 2015-03-05 DIAGNOSIS — J9611 Chronic respiratory failure with hypoxia: Secondary | ICD-10-CM

## 2015-03-05 DIAGNOSIS — I270 Primary pulmonary hypertension: Secondary | ICD-10-CM | POA: Diagnosis not present

## 2015-03-05 DIAGNOSIS — Z86711 Personal history of pulmonary embolism: Secondary | ICD-10-CM | POA: Diagnosis not present

## 2015-03-05 DIAGNOSIS — R058 Other specified cough: Secondary | ICD-10-CM

## 2015-03-05 DIAGNOSIS — G4733 Obstructive sleep apnea (adult) (pediatric): Secondary | ICD-10-CM

## 2015-03-05 DIAGNOSIS — K5901 Slow transit constipation: Secondary | ICD-10-CM | POA: Diagnosis not present

## 2015-03-05 DIAGNOSIS — I509 Heart failure, unspecified: Secondary | ICD-10-CM

## 2015-03-05 DIAGNOSIS — R5381 Other malaise: Secondary | ICD-10-CM | POA: Diagnosis not present

## 2015-03-05 DIAGNOSIS — I50811 Acute right heart failure: Secondary | ICD-10-CM

## 2015-03-05 DIAGNOSIS — R05 Cough: Secondary | ICD-10-CM

## 2015-03-05 DIAGNOSIS — I272 Pulmonary hypertension, unspecified: Secondary | ICD-10-CM

## 2015-03-05 MED FILL — Bupivacaine HCl Preservative Free (PF) Inj 0.25%: INTRAMUSCULAR | Qty: 60 | Status: AC

## 2015-03-05 MED FILL — Heparin Sodium (Porcine) 2 Unit/ML in Sodium Chloride 0.9%: INTRAMUSCULAR | Qty: 500 | Status: AC

## 2015-03-05 NOTE — Progress Notes (Signed)
Patient ID: [name], female   DOB: Jun 23, 1934, 81 y.o.   MRN: [id number]   Place of Service: [facility]  Allergies  Allergen Reactions  . Penicillins Hives  . Sulfonamide Derivatives Hives  . Zithromax [Azithromycin] Other (See Comments)    Prolonged QT on EKG  . Adhesive [Tape] Other (See Comments)    redness  . Prednisone Other (See Comments)    Causes insomnia    Code Status: DNR  Goals of Care: Comfort and Quality of Life/STR  Chief Complaint  Patient presents with  . Hospitalization Follow-up    HPI Reviewed of hospital record showed 81 y.o. female with PMH of HTN, PSVT, HLD, OBA, spinal stenosis among others is being seen for a follow-up visit post hospital admission from 02/25/15 to 5/716 with chest pain and was found to have "afib with HV of 122" and acute CHF. She was admitted 2 weeks ago with similar complaints and was found to have severely prolonged QT interval as was treated for CAP and discharged on 02/15/15 with oral abx. Repeat CXR on recent admission negative for PNA. She had excessive episodes of atrial tachycardia/SVT throughout the hospital stay. An ablation was recommended in the past but she declined it. During this admission, she agreed to the ablation and tolerated the the procedure well. She is now here for short term rehab and her goal is to return home. Seen in room today. Reported having cough, mostly dry but productive at times. No other concerns reported.   Review of Systems Constitutional: Negative for fever and chills HENT: Negative for ear pain, congestion, and sore throat Eyes: Negative for eye pain and eye discharge Cardiovascular: Negative for chest pain and palpitations. Positive for leg swelling Respiratory: Positive for productive cough. Negative for shortness of breath and wheezing.  Gastrointestinal: Negative for nausea and vomiting. Negative for abdominal pain, diarrhea and constipation.  Genitourinary: Negative for   dysuria Endocrine: Negative for polydipsia, polyphagia, and polyuria Musculoskeletal: Negative for uncontrolled pain Neurological: Negative for dizziness and headache Skin: Negative for rash and wound Psychiatric: Negative for depression  Past Medical History  Diagnosis Date  . Osteopenia   . Hyperlipidemia   . Hypertension   . PSVT (paroxysmal supraventricular tachycardia)   . Spinal stenosis   . Overactive bladder     urge incontinence  . Pulmonary emboli   . Pulmonary hypertension   . Right ventricular failure   . Arthritis     knee  . Anemia   . Diverticulosis   . Sleep apnea     CPAP  . Hx of colonic polyps 10/24/2014    Past Surgical History  Procedure Laterality Date  . Cataract extraction Bilateral 2008  . Bladder suspension  1993  . Total knee arthroplasty Left 2000  . Total knee revision  01/12/2012    Procedure: TOTAL KNEE REVISION;  Surgeon: Kerin Salen, MD;  Location: [place];  Service: Orthopedics;  Laterality: Left;  DEPUY/ LCS , HAND SET  . Trigger finger release  01/12/2012    Procedure: RELEASE TRIGGER FINGER/A-1 PULLEY;  Surgeon: Kerin Salen, MD;  Location: [place];  Service: Orthopedics;  Laterality: Right;  . Cholecystectomy    . Incisional hernia repair    . Total knee arthroplasty Left 10/29/2012    at [facility]  . Total knee arthroplasty Right 03/09/2014  . Colonoscopy    . Right heart catheterization N/A 07/04/2014    Procedure: RIGHT HEART CATH;  Surgeon: Shaune Pascal Bensimhon,  MD;  Location: [place] CATH LAB;  Service: Cardiovascular;  Laterality: N/A;  . Tonsillectomy and adenoidectomy    . Colonoscopy N/A 10/24/2014    Procedure: COLONOSCOPY;  Surgeon: Gatha Mayer, MD;  Location: [facility];  Service: Endoscopy;  Laterality: N/A;  . Cardiac catheterization N/A 02/28/2015    Procedure: Right/Left Heart Cath and Coronary Angiography;  Surgeon: Troy Sine, MD;  Location: [place] INVASIVE CV LAB CUPID;  Service: Cardiovascular;  Laterality: N/A;  .  Electrophysiologic study N/A 03/02/2015    Procedure: SVT Ablation;  Surgeon: Evans Lance, MD;  Location: [place] CV [facility];  Service: Cardiovascular;  Laterality: N/A;    History  Substance Use Topics  . Smoking status: Never Smoker   . Smokeless tobacco: Never Used  . Alcohol Use: No    Family History  Problem Relation Age of Onset  . Breast cancer Mother   . Heart disease Father   . Diabetes Father   . Hypertension Father   . Uterine cancer Sister       Medication List       This list is accurate as of: 03/05/15  1:52 PM.  Always use your most recent med list.               acetaminophen 325 MG tablet  Commonly known as:  TYLENOL  Take 2 tablets (650 mg total) by mouth every 4 (four) hours as needed for headache or mild pain.     apixaban 5 MG Tabs tablet  Commonly known as:  ELIQUIS  Take 1 tablet (5 mg total) by mouth 2 (two) times daily.     benzonatate 200 MG capsule  Commonly known as:  TESSALON  Take 1 capsule (200 mg total) by mouth 2 (two) times daily as needed for cough.     CALCIUM 600/VITAMIN D3 600-800 MG-UNIT Tabs  Generic drug:  Calcium Carb-Cholecalciferol  Take 1 tablet by mouth 2 (two) times daily.     furosemide 20 MG tablet  Commonly known as:  LASIX  Take 20 mg by mouth daily.     metoprolol 50 MG tablet  Commonly known as:  LOPRESSOR  Take 0.5 tablets (25 mg total) by mouth 2 (two) times daily.     multivitamin with minerals Tabs tablet  Take 1 tablet by mouth daily.     ondansetron 4 MG tablet  Commonly known as:  ZOFRAN  Take 1 tablet (4 mg total) by mouth every 8 (eight) hours as needed for nausea or vomiting.     OXYGEN  Inhale 2 L/min into the lungs continuous.     polyethylene glycol packet  Commonly known as:  MIRALAX / GLYCOLAX  Take 8.5 g by mouth daily.     potassium chloride 10 MEQ tablet  Commonly known as:  K-DUR  Take 1 tablet (10 mEq total) by mouth daily.     vitamin C 500 MG tablet  Commonly known as:   ASCORBIC ACID  Take 500 mg by mouth daily.        Physical Exam  BP 128/76 mmHg  Pulse 80  Temp(Src) 97.5 F (36.4 C)  Resp 17  Wt 212 lb 6.4 oz (96.344 kg)  SpO2 97%  Constitutional: Obese but frail elderly female in no acute distress. Conversant and pleasant HEENT: Normocephalic and atraumatic. PERRL. EOM intact. No icterus. Oral mucosa moist. Posterior pharynx clear of any exudate or lesions.  Neck: Supple and nontender. No lymphadenopathy, masses, or thyromegaly. No JVD or  carotid bruits. Cardiac: Normal S1, S2. RRR without appreciable murmurs, rubs, or gallops. Distal pulses intact. 1+ pitting edema of BLE Respiratory: Unlabored respiration. Breath sounds clear bilaterally without rales, rhonchi, or wheezes. GI: Audible bowel sounds in all quadrants. Soft, nontender, nondistended.  Musculoskeletal: able to move all extremities with generalized weankess Skin: Warm and dry. No rash noted. No erythema. Large bruise noted on right thigh and abdomen. Bilateral groins erythematous Neurological: Alert and oriented to self Psychiatric: Judgment and insight adequate. Appropriate mood and affect.   Labs Reviewed  CBC Latest Ref Rng 03/02/2015 02/25/2015 02/19/2015  WBC 4.0 - 10.5 K/uL 5.6 6.2 6.8  Hemoglobin 12.0 - 15.0 g/dL 12.0 12.7 12.7  Hematocrit 36.0 - 46.0 % 40.2 42.9 39.0  Platelets 150 - 400 K/uL 180 153 186.0    CMP Latest Ref Rng 03/02/2015 02/28/2015 02/26/2015  Glucose 70 - 99 mg/dL - 125(H) 120(H)  BUN 6 - 20 mg/dL - 19 19  Creatinine 0.44 - 1.00 mg/dL 1.04(H) 0.99 1.07(H)  Sodium 135 - 145 mmol/L - 138 138  Potassium 3.5 - 5.1 mmol/L - 4.4 4.5  Chloride 101 - 111 mmol/L - 97(L) 93(L)  CO2 22 - 32 mmol/L - 34(H) 38(H)  Calcium 8.9 - 10.3 mg/dL - 8.3(L) 8.9  Total Protein 6.0 - 8.3 g/dL - - -  Total Bilirubin 0.3 - 1.2 mg/dL - - -  Alkaline Phos 39 - 117 U/L - - -  AST 0 - 37 U/L - - -  ALT 0 - 35 U/L - - -    Lab Results  Component Value Date   HGBA1C 5.8*  02/14/2015    Lab Results  Component Value Date   TSH 3.600 03/14/2014    Lipid Panel     Component Value Date/Time   CHOL 104 03/14/2014 0519   TRIG 95 03/14/2014 0519   HDL 32* 03/14/2014 0519   CHOLHDL 3.3 03/14/2014 0519   VLDL 19 03/14/2014 0519   LDLCALC 53 03/14/2014 0519    Diagnostic Studies Reviewed 03/03/15:  CXR 1. Lung base opacity, right greater than left. 2. Right lung base opacity appears improved from most recent prior study consistent with a combination of a decrease in right pleural fluid with decreased atelectasis. Milder, stable left basilar opacity is consistent with atelectasis. No convincing pneumonia. No pulmonary edema. 3. Stable cardiomegaly.  02/28/15: Cardiac catheterization -No significant coronary obstructive disease with mild proximal RCA ectasia.  -Low normal global LV function with an ejection fraction of 50-55% without focal segmental wall motion abnormalities. -Mild to moderate pulmonary hypertension  02/12/15: 2D Echo - Left ventricle: Paradoxical septal motiom. The cavity size was normal. Wall thickness was increased in a pattern of mild LVH. The estimated ejection fraction was 55%. - Mitral valve: There was mild regurgitation. - Right ventricle: The cavity size was severely dilated. Systolic function was moderately to severely reduced. - Tricuspid valve: There was moderate regurgitation. - Pulmonary arteries: PA peak pressure: 57 mm Hg (S). - Pericardium, extracardiac: A small pericardial effusion wasidentified posterior to the heart.  Assessment & Plan 1. Acute right ventricular heart failure Appears euvolemic on exam. EF 50-55% per recent cardiac cath. Continue lasix 20mg  daily with kcl supplement. Continue daily weight and monitor her status.  2. OSA (obstructive sleep apnea) Continue to CPAP at night and monitor  3. Physical deconditioning Continue to work with PT/OT for gait/strength/balance training to restore/maximize  functions. Fall risk precautions  4. History of bilat PE after TKR (off Coumadin  Dec 2015) Off of coumadin 09/2014. Continue eliquis 5mg  twice daily. Monitor clinically.   5. Slow transit constipation Continue miralax 8.5mg  daily. Encourage adequate hydration and ambulation as tolerated.   6. PSVT with SSS S/p ablation of slow AV nodal pathway on 03/02/15. RRR on exam. Continue eliquis 5mg  twice daily.   7. Pulmonary hypertension Thought maybe in relation to OSA, PEs in the past, and obesity hypoventilation syndrome. Continue lopressor 25mg  twice daily and lasix 20mg  daily.    8. Productive cough Continue tessalon 200mg  twice daily as needed for cough. Add mucinex 600mg  twice daily 5 days then twice daily as needed for cough and congestion. Encourage adequate hydration.   9. Chronic respiratory failure with hypoxia Was discharged on 2L of oxygen on 02/15/15 when she was treated for pneumonia. Etiology of continued hypoxia not fully determined. Recent CT negative for etiology. Continue O2 via nasal cannula @ 2lpm to maintain O2 sat>88%  10. Intertrigo Nystatin to bil groins twice daily until healing complete.   Diagnostic Studies/Labs Ordered: cbc, bmp in 1 week  Time spent: 45 minutes with >50% of total time spent on care coordination   Family/Staff Communication Plan of care discussed with patient and nursing staff. Patient and nursing staff verbalized understanding and agree with plan of care. No additional questions or concerns reported.    Arthur Holms, MSN, AGNP-C [facility] [address] [place], [place] [address] [phone] [8am-5pm] After hours: [phone]

## 2015-03-06 ENCOUNTER — Ambulatory Visit: Payer: Medicare Other | Admitting: Adult Health

## 2015-03-08 ENCOUNTER — Non-Acute Institutional Stay (SKILLED_NURSING_FACILITY): Payer: Medicare Other | Admitting: Internal Medicine

## 2015-03-08 DIAGNOSIS — I2699 Other pulmonary embolism without acute cor pulmonale: Secondary | ICD-10-CM | POA: Diagnosis not present

## 2015-03-08 DIAGNOSIS — G4733 Obstructive sleep apnea (adult) (pediatric): Secondary | ICD-10-CM

## 2015-03-08 DIAGNOSIS — K5901 Slow transit constipation: Secondary | ICD-10-CM

## 2015-03-08 DIAGNOSIS — J9611 Chronic respiratory failure with hypoxia: Secondary | ICD-10-CM | POA: Diagnosis not present

## 2015-03-08 DIAGNOSIS — I50811 Acute right heart failure: Secondary | ICD-10-CM

## 2015-03-08 DIAGNOSIS — I471 Supraventricular tachycardia, unspecified: Secondary | ICD-10-CM

## 2015-03-08 DIAGNOSIS — I509 Heart failure, unspecified: Secondary | ICD-10-CM | POA: Diagnosis not present

## 2015-03-08 DIAGNOSIS — R21 Rash and other nonspecific skin eruption: Secondary | ICD-10-CM | POA: Diagnosis not present

## 2015-03-08 DIAGNOSIS — R5381 Other malaise: Secondary | ICD-10-CM

## 2015-03-09 ENCOUNTER — Encounter: Payer: Self-pay | Admitting: Internal Medicine

## 2015-03-09 NOTE — Progress Notes (Signed)
This encounter was created in error - please disregard.  This encounter was created in error - please disregard.

## 2015-03-09 NOTE — Progress Notes (Signed)
Patient ID: [name], female   DOB: [birth year], 81 y.o.   MRN: [id number]     Facility: [facility]   Code status: DNR  PCP: Loura Pardon, MD    Allergies  Allergen Reactions  . Penicillins Hives  . Sulfonamide Derivatives Hives  . Zithromax [Azithromycin] Other (See Comments)    Prolonged QT on EKG  . Adhesive [Tape] Other (See Comments)    redness  . Prednisone Other (See Comments)    Causes insomnia    Chief Complaint  Patient presents with  . New Admit To SNF     HPI:  81 year old patient is here for short term rehabilitation post hospital admission from 02/25/15-03/03/15 with acute chf exacerbation and PSVT. She underwent ablation. She has PMH of HTN, PSVT, HLD, OBA, spinal stenosis. She is seen in her room today. Her daughter is present at bedside. She has some dry cough but denies any other concerns.  Review of Systems:  Constitutional: Negative for fever, chills, diaphoresis.  HENT: Negative for headache, congestion, nasal discharge Eyes: Negative for eye pain, blurred vision, double vision and discharge.  Respiratory: Negative for shortness of breath and wheezing.   Cardiovascular: Negative for chest pain, palpitations. Has chronic leg swelling.  Gastrointestinal: Negative for heartburn, nausea, vomiting, abdominal pain Genitourinary: Negative for dysuria.  Musculoskeletal: Negative for back pain, falls Skin: Negative for itching, sores and rash.  Neurological: Negative for dizziness, tingling, focal weakness Psychiatric/Behavioral: Negative for depression,  Past Medical History  Diagnosis Date  . Osteopenia   . Hyperlipidemia   . Hypertension   . PSVT (paroxysmal supraventricular tachycardia)   . Spinal stenosis   . Overactive bladder     urge incontinence  . Pulmonary emboli   . Pulmonary hypertension   . Right ventricular failure   . Arthritis     knee  . Anemia   . Diverticulosis   . Sleep apnea     CPAP  . Hx of  colonic polyps 10/24/2014   Past Surgical History  Procedure Laterality Date  . Cataract extraction Bilateral 2008  . Bladder suspension  1993  . Total knee arthroplasty Left 2000  . Total knee revision  01/12/2012    Procedure: TOTAL KNEE REVISION;  Surgeon: Kerin Salen, MD;  Location: [place];  Service: Orthopedics;  Laterality: Left;  DEPUY/ LCS , HAND SET  . Trigger finger release  01/12/2012    Procedure: RELEASE TRIGGER FINGER/A-1 PULLEY;  Surgeon: Kerin Salen, MD;  Location: [place];  Service: Orthopedics;  Laterality: Right;  . Cholecystectomy    . Incisional hernia repair    . Total knee arthroplasty Left 10/29/2012    at [facility]  . Total knee arthroplasty Right 03/09/2014  . Colonoscopy    . Right heart catheterization N/A 07/04/2014    Procedure: RIGHT HEART CATH;  Surgeon: Jolaine Artist, MD;  Location: [facility] C[facility] LAB;  Service: Cardiovascular;  Laterality: N/A;  . Tonsillectomy and adenoidectomy    . Colonoscopy N/A 10/24/2014    Procedure: COLONOSCOPY;  Surgeon: Gatha Mayer, MD;  Location: [facility];  Service: Endoscopy;  Laterality: N/A;  . Cardiac catheterization N/A 02/28/2015    Procedure: Right/Left Heart Cath and Coronary Angiography;  Surgeon: Troy Sine, MD;  Location: [place] INVASIVE CV LAB CUPID;  Service: Cardiovascular;  Laterality: N/A;  . Electrophysiologic study N/A 03/02/2015    Procedure: SVT Ablation;  Surgeon: Evans Lance, MD;  Location: [place] CV LAB;  Service: Cardiovascular;  Laterality: N/A;   Social History:   reports that she has never smoked. She has never used smokeless tobacco. She reports that she does not drink alcohol or use illicit drugs.  Family History  Problem Relation Age of Onset  . Breast cancer Mother   . Heart disease Father   . Diabetes Father   . Hypertension Father   . Uterine cancer Sister     Medications: Patient's Medications  New Prescriptions   No medications on file  Previous Medications   ACETAMINOPHEN  (TYLENOL) 325 MG TABLET    Take 2 tablets (650 mg total) by mouth every 4 (four) hours as needed for headache or mild pain.   APIXABAN (ELIQUIS) 5 MG TABS TABLET    Take 1 tablet (5 mg total) by mouth 2 (two) times daily.   BENZONATATE (TESSALON) 200 MG CAPSULE    Take 1 capsule (200 mg total) by mouth 2 (two) times daily as needed for cough.   CALCIUM CARB-CHOLECALCIFEROL (CALCIUM 600/VITAMIN D3) 600-800 MG-UNIT TABS    Take 1 tablet by mouth 2 (two) times daily.   FUROSEMIDE (LASIX) 20 MG TABLET    Take 20 mg by mouth daily.   METOPROLOL (LOPRESSOR) 50 MG TABLET    Take 0.5 tablets (25 mg total) by mouth 2 (two) times daily.   MULTIPLE VITAMIN (MULTIVITAMIN WITH MINERALS) TABS TABLET    Take 1 tablet by mouth daily.   ONDANSETRON (ZOFRAN) 4 MG TABLET    Take 1 tablet (4 mg total) by mouth every 8 (eight) hours as needed for nausea or vomiting.   OXYGEN    Inhale 2 L/min into the lungs continuous.    POLYETHYLENE GLYCOL (MIRALAX / GLYCOLAX) PACKET    Take 8.5 g by mouth daily.    POTASSIUM CHLORIDE (K-DUR) 10 MEQ TABLET    Take 1 tablet (10 mEq total) by mouth daily.   VITAMIN C (ASCORBIC ACID) 500 MG TABLET    Take 500 mg by mouth daily.  Modified Medications   No medications on file  Discontinued Medications   No medications on file     Physical Exam: Filed Vitals:   03/08/15 1606  BP: 134/74  Pulse: 61  Temp: 97.7 F (36.5 C)  Resp: 18  Weight: 207 lb 9.6 oz (94.167 kg)  SpO2: 95%    General- elderly female, well built, in no acute distress Head- normocephalic, atraumatic Throat- moist mucus membrane Eyes- PERRLA, EOMI, no pallor, no icterus, no discharge, normal conjunctiva, normal sclera Neck- no cervical lymphadenopathy, no jugular vein distension Cardiovascular- normal s1,s2, no murmurs, palpable dorsalis pedis, 1 + leg edema right > left Respiratory- bilateral clear to auscultation, no wheeze, no rhonchi, no crackles, no use of accessory muscles, on o2 Abdomen- bowel  sounds present, soft, non tender Musculoskeletal- able to move all 4 extremities, generalized weakness Neurological- no focal deficit Skin- warm and dry Psychiatry- alert and oriented to person, place and time, normal mood and affect    Labs reviewed: Basic Metabolic Panel:  Recent Labs  03/14/14 0315  02/10/15 1614  02/25/15 0559 02/26/15 0836 02/28/15 1419 03/02/15 2015  NA 134*  < >  --   < > 137 138 138  --   K 4.0  < >  --   < > 4.1 4.5 4.4  --   CL 95*  < >  --   < > 94* 93* 97*  --   CO2 26  < >  --   < >  35* 38* 34*  --   GLUCOSE 123*  < >  --   < > 112* 120* 125*  --   BUN 25*  < >  --   < > 19 19 19   --   CREATININE 0.69  < >  --   < > 1.12* 1.07* 0.99 1.04*  CALCIUM 8.2*  < >  --   < > 9.3 8.9 8.3*  --   MG 2.0  --  1.7  --   --   --   --   --   PHOS  --   --  3.4  --   --   --   --   --   < > = values in this interval not displayed. Liver Function Tests:  Recent Labs  03/14/14 0315 02/10/15 1106  AST 25 37  ALT 17 19  ALKPHOS 66 66  BILITOT 0.7 1.1  PROT 6.5 7.1  ALBUMIN 2.6* 3.2*    Recent Labs  02/10/15 1106  LIPASE 23   No results for input(s): AMMONIA in the last 8760 hours. CBC:  Recent Labs  03/13/14 1956  02/10/15 1106  02/19/15 1527 02/25/15 0559 03/02/15 2015  WBC 7.8  < > 10.4  < > 6.8 6.2 5.6  NEUTROABS 6.5  --  7.4  --   --  4.3  --   HGB 12.3  < > 14.2  < > 12.7 12.7 12.0  HCT 36.6  < > 44.5  < > 39.0 42.9 40.2  MCV 93.6  < > 92.1  < > 91.6 98.8 99.3  PLT 256  < > 192  < > 186.0 153 180  < > = values in this interval not displayed. Cardiac Enzymes:  Recent Labs  02/25/15 0827 02/25/15 1443 02/25/15 2021  TROPONINI 0.04* 0.03 0.04*   BNP: Invalid input(s): POCBNP CBG: No results for input(s): GLUCAP in the last 8760 hours.  Radiological Exams: 03/03/15:  CXR 1. Lung base opacity, right greater than left. 2. Right lung base opacity appears improved from most recent prior study consistent with a combination of a  decrease in right pleural fluid with decreased atelectasis. Milder, stable left basilar opacity is consistent with atelectasis. No convincing pneumonia. No pulmonary edema. 3. Stable cardiomegaly.  02/28/15: Cardiac catheterization -No significant coronary obstructive disease with mild proximal RCA ectasia.    -Low normal global LV function with an ejection fraction of 50-55% without focal segmental wall motion abnormalities. -Mild to moderate pulmonary hypertension  02/12/15: 2D Echo - Left ventricle: Paradoxical septal motiom. The cavity size was normal. Wall thickness was increased in a pattern of mild LVH. The estimated ejection fraction was 55%. - Mitral valve: There was mild regurgitation. - Right ventricle: The cavity size was severely dilated. Systolic function was moderately to severely reduced. - Tricuspid valve: There was moderate regurgitation. - Pulmonary arteries: PA peak pressure: 57 mm Hg (S). - Pericardium, extracardiac: A small pericardial effusion was identified posterior to the heart.   Assessment/Plan  Physical deconditioning Will have patient work with PT/OT as tolerated to regain strength and restore function.  Fall precautions are in place.  Acute CHF Appears euvolemic on exam. EF 50-55% per recent cardiac cath. Continue lopressor 25 mg bid and lasix 20mg  daily with kcl supplement. Continue daily weight   PSVT Denies palpitation or chest pain. S/p ablation. Also on eliquis  Groin rash Moisture related, to keep skin area clean and dry and apply nystatin  Constipation Stable.  Continue miralax   OSA  Continue to CPAP at night and monitor  History of bilateral PE  Continue eliquis 5mg  twice daily.   Chronic respiratory failure with hypoxia Continue O2 via nasal cannula @ 2lpm to maintain O2 sat>88%, has f/u with pulmonary   Goals of care: short term rehabilitation   Labs/tests ordered: cbc, bmp  Family/ staff Communication: reviewed care plan with  patient and nursing supervisor    Blanchie Serve, MD  [facility] [phone] (Monday-Friday 8 am - 5 pm) [phone] (afterhours)

## 2015-03-12 LAB — CBC AND DIFFERENTIAL
HCT: 45 % (ref 36–46)
Hemoglobin: 14.3 g/dL (ref 12.0–16.0)
Platelets: 311 10*3/uL (ref 150–399)
WBC: 4.2 10*3/mL

## 2015-03-12 LAB — BASIC METABOLIC PANEL
BUN: 25 mg/dL — AB (ref 4–21)
Creatinine: 1 mg/dL (ref 0.5–1.1)
GLUCOSE: 101 mg/dL
POTASSIUM: 4.3 mmol/L (ref 3.4–5.3)
SODIUM: 141 mmol/L (ref 137–147)

## 2015-03-20 ENCOUNTER — Other Ambulatory Visit: Payer: Self-pay

## 2015-03-20 ENCOUNTER — Non-Acute Institutional Stay (SKILLED_NURSING_FACILITY): Payer: Medicare Other | Admitting: Registered Nurse

## 2015-03-20 ENCOUNTER — Encounter: Payer: Self-pay | Admitting: Registered Nurse

## 2015-03-20 DIAGNOSIS — I509 Heart failure, unspecified: Secondary | ICD-10-CM | POA: Diagnosis not present

## 2015-03-20 DIAGNOSIS — R5381 Other malaise: Secondary | ICD-10-CM | POA: Diagnosis not present

## 2015-03-20 DIAGNOSIS — Z86711 Personal history of pulmonary embolism: Secondary | ICD-10-CM

## 2015-03-20 DIAGNOSIS — I50811 Acute right heart failure: Secondary | ICD-10-CM

## 2015-03-20 DIAGNOSIS — K5901 Slow transit constipation: Secondary | ICD-10-CM

## 2015-03-20 DIAGNOSIS — I272 Pulmonary hypertension, unspecified: Secondary | ICD-10-CM

## 2015-03-20 DIAGNOSIS — G4733 Obstructive sleep apnea (adult) (pediatric): Secondary | ICD-10-CM | POA: Diagnosis not present

## 2015-03-20 DIAGNOSIS — I270 Primary pulmonary hypertension: Secondary | ICD-10-CM | POA: Diagnosis not present

## 2015-03-20 DIAGNOSIS — J9611 Chronic respiratory failure with hypoxia: Secondary | ICD-10-CM

## 2015-03-20 DIAGNOSIS — I471 Supraventricular tachycardia: Secondary | ICD-10-CM

## 2015-03-20 NOTE — Progress Notes (Signed)
Patient ID: [name], female   DOB: 1934/09/23, 81 y.o.   MRN: [id number]   Place of Service: [facility]  Allergies  Allergen Reactions  . Penicillins Hives  . Sulfonamide Derivatives Hives  . Zithromax [Azithromycin] Other (See Comments)    Prolonged QT on EKG  . Adhesive [Tape] Other (See Comments)    redness  . Prednisone Other (See Comments)    Causes insomnia    Code Status: DNR  Goals of Care: Comfort and Quality of Life/STR  Chief Complaint  Patient presents with  . Discharge Note    HPI 81 y.o. female with PMH of HTN, PSVT, HLD, OBA, spinal stenosis among others is being seen for a discharge visit. She was here for short term rehab post hospital admission from 02/25/15 to 5/716 with chest pain and was found to have "afib with HV of 122" and acute CHF. She had excessive episodes of atrial tachycardia/SVT throughout the hospital stay. An ablation was recommended in the past but she declined it. During recent hospital admission, she agreed to the ablation and tolerated the the procedure well. She has worked well with therapy team an is stable to be discharged home with [facility] PT/OT and DME (Rollator, shower chair). Seen in room today. Denies any concerns  Review of Systems Constitutional: Negative for fever and chills HENT: Negative for ear pain, congestion, and sore throat Eyes: Negative for eye pain and eye discharge Cardiovascular: Negative for chest pain and palpitations. Positive for leg swelling Respiratory: Negative for cough, shortness of breath, and wheezing.  Gastrointestinal: Negative for nausea and vomiting. Negative for abdominal pain, diarrhea and constipation.  Genitourinary: Negative for  dysuria Endocrine: Negative for polydipsia, polyphagia, and polyuria Musculoskeletal: Negative for uncontrolled pain Neurological: Negative for dizziness and headache Skin: Negative for rash and wound Psychiatric: Negative for depression  Past Medical History    Diagnosis Date  . Osteopenia   . Hyperlipidemia   . Hypertension   . PSVT (paroxysmal supraventricular tachycardia)   . Spinal stenosis   . Overactive bladder     urge incontinence  . Pulmonary emboli   . Pulmonary hypertension   . Right ventricular failure   . Arthritis     knee  . Anemia   . Diverticulosis   . Sleep apnea     CPAP  . Hx of colonic polyps 10/24/2014    Past Surgical History  Procedure Laterality Date  . Cataract extraction Bilateral 2008  . Bladder suspension  1993  . Total knee arthroplasty Left 2000  . Total knee revision  01/12/2012    Procedure: TOTAL KNEE REVISION;  Surgeon: Kerin Salen, MD;  Location: [place];  Service: Orthopedics;  Laterality: Left;  DEPUY/ LCS , HAND SET  . Trigger finger release  01/12/2012    Procedure: RELEASE TRIGGER FINGER/A-1 PULLEY;  Surgeon: Kerin Salen, MD;  Location: [place];  Service: Orthopedics;  Laterality: Right;  . Cholecystectomy    . Incisional hernia repair    . Total knee arthroplasty Left 10/29/2012    at [facility]  . Total knee arthroplasty Right 03/09/2014  . Colonoscopy    . Right heart catheterization N/A 07/04/2014    Procedure: RIGHT HEART CATH;  Surgeon: Jolaine Artist, MD;  Location: [facility] CATH LAB;  Service: Cardiovascular;  Laterality: N/A;  . Tonsillectomy and adenoidectomy    . Colonoscopy N/A 10/24/2014    Procedure: COLONOSCOPY;  Surgeon: Gatha Mayer, MD;  Location: [facility];  Service:  Endoscopy;  Laterality: N/A;  . Cardiac catheterization N/A 02/28/2015    Procedure: Right/Left Heart Cath and Coronary Angiography;  Surgeon: Troy Sine, MD;  Location: [place] INVASIVE CV LAB CUPID;  Service: Cardiovascular;  Laterality: N/A;  . Electrophysiologic study N/A 03/02/2015    Procedure: SVT Ablation;  Surgeon: Evans Lance, MD;  Location: [place] CV LAB;  Service: Cardiovascular;  Laterality: N/A;    History  Substance Use Topics  . Smoking status: Never Smoker   . Smokeless tobacco: Never Used   . Alcohol Use: No    Family History  Problem Relation Age of Onset  . Breast cancer Mother   . Heart disease Father   . Diabetes Father   . Hypertension Father   . Uterine cancer Sister       Medication List       This list is accurate as of: 03/20/15  3:18 PM.  Always use your most recent med list.               acetaminophen 325 MG tablet  Commonly known as:  TYLENOL  Take 2 tablets (650 mg total) by mouth every 4 (four) hours as needed for headache or mild pain.     apixaban 5 MG Tabs tablet  Commonly known as:  ELIQUIS  Take 1 tablet (5 mg total) by mouth 2 (two) times daily.     benzonatate 200 MG capsule  Commonly known as:  TESSALON  Take 1 capsule (200 mg total) by mouth 2 (two) times daily as needed for cough.     CALCIUM 600/VITAMIN D3 600-800 MG-UNIT Tabs  Generic drug:  Calcium Carb-Cholecalciferol  Take 1 tablet by mouth 2 (two) times daily.     furosemide 20 MG tablet  Commonly known as:  LASIX  Take 20 mg by mouth daily.     metoprolol 50 MG tablet  Commonly known as:  LOPRESSOR  Take 0.5 tablets (25 mg total) by mouth 2 (two) times daily.     multivitamin with minerals Tabs tablet  Take 1 tablet by mouth daily.     ondansetron 4 MG tablet  Commonly known as:  ZOFRAN  Take 1 tablet (4 mg total) by mouth every 8 (eight) hours as needed for nausea or vomiting.     OXYGEN  Inhale 2 L/min into the lungs continuous.     polyethylene glycol packet  Commonly known as:  MIRALAX / GLYCOLAX  Take 8.5 g by mouth daily.     potassium chloride 10 MEQ tablet  Commonly known as:  K-DUR  Take 1 tablet (10 mEq total) by mouth daily.     vitamin C 500 MG tablet  Commonly known as:  ASCORBIC ACID  Take 500 mg by mouth daily.        Physical Exam  BP 136/78 mmHg  Pulse 68  Temp(Src) 97.8 F (36.6 C)  Resp 16  Ht 5\' 7"  (1.702 m)  Wt 198 lb 9.6 oz (90.084 kg)  BMI 31.10 kg/m2  SpO2 91%  Constitutional: Obese but frail elderly female in no  acute distress. Conversant and pleasant HEENT: Normocephalic and atraumatic. PERRL. EOM intact. No icterus. Oral mucosa moist. Posterior pharynx clear of any exudate or lesions.  Neck: Supple and nontender. No lymphadenopathy, masses, or thyromegaly. No JVD or carotid bruits. Cardiac: Normal S1, S2. RRR without appreciable murmurs, rubs, or gallops. Distal pulses intact. Trace pitting edema of BLE Respiratory: Unlabored respiration. Breath sounds clear bilaterally without rales, rhonchi,  or wheezes. GI: Audible bowel sounds in all quadrants. Soft, nontender, nondistended.  Musculoskeletal: able to move all extremities with generalized weankess Skin: Warm and dry.  Neurological: Alert and oriented to self Psychiatric: Judgment and insight adequate. Appropriate mood and affect.   Labs Reviewed  CBC Latest Ref Rng 03/12/2015 03/02/2015 02/25/2015  WBC - 4.2 5.6 6.2  Hemoglobin 12.0 - 16.0 g/dL 14.3 12.0 12.7  Hematocrit 36 - 46 % 45 40.2 42.9  Platelets 150 - 399 K/L 311 180 153    CMP Latest Ref Rng 03/12/2015 03/02/2015 02/28/2015  Glucose 70 - 99 mg/dL - - 125(H)  BUN 4 - 21 mg/dL 25(A) - 19  Creatinine 0.5 - 1.1 mg/dL 1.0 1.04(H) 0.99  Sodium 137 - 147 mmol/L 141 - 138  Potassium 3.4 - 5.3 mmol/L 4.3 - 4.4  Chloride 101 - 111 mmol/L - - 97(L)  CO2 22 - 32 mmol/L - - 34(H)  Calcium 8.9 - 10.3 mg/dL - - 8.3(L)  Total Protein 6.0 - 8.3 g/dL - - -  Total Bilirubin 0.3 - 1.2 mg/dL - - -  Alkaline Phos 39 - 117 U/L - - -  AST 0 - 37 U/L - - -  ALT 0 - 35 U/L - - -    Lab Results  Component Value Date   HGBA1C 5.8* 02/14/2015    Lab Results  Component Value Date   TSH 3.600 03/14/2014    Lipid Panel     Component Value Date/Time   CHOL 104 03/14/2014 0519   TRIG 95 03/14/2014 0519   HDL 32* 03/14/2014 0519   CHOLHDL 3.3 03/14/2014 0519   VLDL 19 03/14/2014 0519   LDLCALC 53 03/14/2014 0519    Diagnostic Studies Reviewed 03/03/15:  CXR 1. Lung base opacity, right greater than  left. 2. Right lung base opacity appears improved from most recent prior study consistent with a combination of a decrease in right pleural fluid with decreased atelectasis. Milder, stable left basilar opacity is consistent with atelectasis. No convincing pneumonia. No pulmonary edema. 3. Stable cardiomegaly.  02/28/15: Cardiac catheterization -No significant coronary obstructive disease with mild proximal RCA ectasia.  -Low normal global LV function with an ejection fraction of 50-55% without focal segmental wall motion abnormalities. -Mild to moderate pulmonary hypertension  02/12/15: 2D Echo - Left ventricle: Paradoxical septal motiom. The cavity size was normal. Wall thickness was increased in a pattern of mild LVH. The estimated ejection fraction was 55%. - Mitral valve: There was mild regurgitation. - Right ventricle: The cavity size was severely dilated. Systolic function was moderately to severely reduced. - Tricuspid valve: There was moderate regurgitation. - Pulmonary arteries: PA peak pressure: 57 mm Hg (S). - Pericardium, extracardiac: A small pericardial effusion wasidentified posterior to the heart.  Assessment & Plan 1. Acute right ventricular heart failure Appears euvolemic on exam. EF 50-55% per recent cardiac cath. Continue lasix 20mg  daily with kcl supplement. Continue daily weight and f/u with PCP  2. OSA (obstructive sleep apnea) Stable. Continue to CPAP at night  3. Physical deconditioning Improves. Continue to work with  [facility] PT/OT for gait/strength/balance training to restore/maximize functions. Fall risk precautions  4. History of bilat PE after TKR (off Coumadin Dec 2015) Off of coumadin 09/2014. Continue eliquis 5mg  twice daily. Monitor clinically.   5. Slow transit constipation Stable. Continue miralax 8.5mg  daily. Encourage adequate hydration and ambulation as tolerated.   6. PSVT with SSS S/p ablation of slow AV nodal pathway on 03/02/15. RRR on  exam.  Continue eliquis 5mg  twice daily.   7. Pulmonary hypertension Thought maybe in relation to OSA, PEs in the past, and obesity hypoventilation syndrome. Continue lopressor 25mg  twice daily and lasix 20mg  daily.    8. Chronic respiratory failure with hypoxia Stable. Was discharged on 2L of oxygen on 02/15/15 when she was treated for pneumonia. Etiology of continued hypoxia not fully determined. Recent CT negative for etiology. Continue O2 via nasal cannula @ 2lpm to maintain O2 sat>88%. Continue to f/u with pcp  Home health services: PT/OT DME required: Rollator, shower chair PCP follow-up: Dr. [name] on 04/03/15 @ 12:00pm 30-day supply of prescription medications provided.  Family/Staff Communication Plan of care discussed with patient and nursing staff. Patient and nursing staff verbalized understanding and agree with plan of care. No additional questions or concerns reported.    Arthur Holms, MSN, AGNP-C [facility] [address] [place], [place] [address] [phone] [8am-5pm] After hours: [phone]

## 2015-03-21 ENCOUNTER — Other Ambulatory Visit: Payer: Self-pay

## 2015-03-21 ENCOUNTER — Encounter: Payer: Self-pay | Admitting: Internal Medicine

## 2015-03-21 ENCOUNTER — Ambulatory Visit (INDEPENDENT_AMBULATORY_CARE_PROVIDER_SITE_OTHER): Payer: Medicare Other | Admitting: Internal Medicine

## 2015-03-21 VITALS — BP 126/68 | HR 78 | Ht 66.0 in | Wt 198.8 lb

## 2015-03-21 DIAGNOSIS — I471 Supraventricular tachycardia: Secondary | ICD-10-CM | POA: Diagnosis not present

## 2015-03-21 NOTE — Progress Notes (Signed)
PCP:  Loura Pardon, MD  The patient presents today for cardiology followup.  On eliquis for prior PTE.  Recently underwent ablation for SVT by Dr Lovena Le.  She has done well since without any further symptoms of arrhythmia.  She has been in rehab and is improved.  She is going home soon.  She denies SOB (above baseline) today.  Denies CP or any new concerns.   Past Medical History  Diagnosis Date  . Osteopenia   . Hyperlipidemia   . Hypertension   . PSVT (paroxysmal supraventricular tachycardia)   . Spinal stenosis   . Overactive bladder     urge incontinence  . Pulmonary emboli   . Pulmonary hypertension   . Right ventricular failure   . Arthritis     knee  . Anemia   . Diverticulosis   . Sleep apnea     CPAP  . Hx of colonic polyps 10/24/2014   Past Surgical History  Procedure Laterality Date  . Cataract extraction Bilateral 2008  . Bladder suspension  1993  . Total knee arthroplasty Left 2000  . Total knee revision  01/12/2012    Procedure: TOTAL KNEE REVISION;  Surgeon: Kerin Salen, MD;  Location: [place];  Service: Orthopedics;  Laterality: Left;  DEPUY/ LCS , HAND SET  . Trigger finger release  01/12/2012    Procedure: RELEASE TRIGGER FINGER/A-1 PULLEY;  Surgeon: Kerin Salen, MD;  Location: [place];  Service: Orthopedics;  Laterality: Right;  . Cholecystectomy    . Incisional hernia repair    . Total knee arthroplasty Left 10/29/2012    at [facility]  . Total knee arthroplasty Right 03/09/2014  . Colonoscopy    . Right heart catheterization N/A 07/04/2014    Procedure: RIGHT HEART CATH;  Surgeon: Jolaine Artist, MD;  Location: [facility] CATH LAB;  Service: Cardiovascular;  Laterality: N/A;  . Tonsillectomy and adenoidectomy    . Colonoscopy N/A 10/24/2014    Procedure: COLONOSCOPY;  Surgeon: Gatha Mayer, MD;  Location: [facility];  Service: Endoscopy;  Laterality: N/A;  . Cardiac catheterization N/A 02/28/2015    Procedure: Right/Left Heart Cath and Coronary Angiography;   Surgeon: Troy Sine, MD;  Location: [place] INVASIVE CV LAB CUPID;  Service: Cardiovascular;  Laterality: N/A;  . Electrophysiologic study N/A 03/02/2015    Procedure: SVT Ablation;  Surgeon: Evans Lance, MD;  Location: [place] CV LAB;  Service: Cardiovascular;  Laterality: N/A;    Current Outpatient Prescriptions  Medication Sig Dispense Refill  . acetaminophen (TYLENOL) 325 MG tablet Take 2 tablets (650 mg total) by mouth every 4 (four) hours as needed for headache or mild pain.    Marland Kitchen apixaban (ELIQUIS) 5 MG TABS tablet Take 1 tablet (5 mg total) by mouth 2 (two) times daily. 60 tablet 0  . benzonatate (TESSALON) 200 MG capsule Take 1 capsule (200 mg total) by mouth 2 (two) times daily as needed for cough. 20 capsule 0  . Calcium Carb-Cholecalciferol (CALCIUM 600/VITAMIN D3) 600-800 MG-UNIT TABS Take 1 tablet by mouth 2 (two) times daily.    . furosemide (LASIX) 20 MG tablet Take 20 mg by mouth daily as needed (SWELLING).     Marland Kitchen metoprolol (LOPRESSOR) 50 MG tablet Take 0.5 tablets (25 mg total) by mouth 2 (two) times daily. 60 tablet 0  . Multiple Vitamin (MULTIVITAMIN WITH MINERALS) TABS tablet Take 1 tablet by mouth daily.    . ondansetron (ZOFRAN) 4 MG tablet Take 1 tablet (4 mg total)  by mouth every 8 (eight) hours as needed for nausea or vomiting. 20 tablet 0  . OXYGEN Inhale 2 L/min into the lungs as directed.     . polyethylene glycol (MIRALAX / GLYCOLAX) packet Take 8.5 g by mouth daily as needed (CONSTIPATOIN).     Marland Kitchen potassium chloride (K-DUR) 10 MEQ tablet Take 1 tablet (10 mEq total) by mouth daily. 90 tablet 3  . vitamin C (ASCORBIC ACID) 500 MG tablet Take 500 mg by mouth daily.     No current facility-administered medications for this visit.    Allergies  Allergen Reactions  . Zithromax [Azithromycin] Other (See Comments)    Prolonged QT on EKG  . Penicillins Hives  . Sulfonamide Derivatives Hives  . Adhesive [Tape] Other (See Comments)    redness  . Prednisone Other  (See Comments)    Causes insomnia    History   Social History  . Marital Status: Widowed    Spouse Name: N/A  . Number of Children: 3  . Years of Education: N/A   Occupational History  . Retired   .     Social History Main Topics  . Smoking status: Never Smoker   . Smokeless tobacco: Never Used  . Alcohol Use: No  . Drug Use: No  . Sexual Activity: No   Other Topics Concern  . Not on file   Social History Narrative   Pt lives alone but dtr lives on property and has been staying with her since surgery.    Family History  Problem Relation Age of Onset  . Breast cancer Mother   . Heart disease Father   . Diabetes Father   . Hypertension Father   . Uterine cancer Sister     Physical Exam: Filed Vitals:   03/21/15 1559  BP: 126/68  Pulse: 78  Height: 5\' 6"  (1.676 m)  Weight: 90.175 kg (198 lb 12.8 oz)    GEN- The patient is elderly appearing, alert and oriented x 3 today.   Head- normocephalic, atraumatic Eyes-  Sclera clear, conjunctiva pink Ears- hearing intact Oropharynx- clear Neck- supple,   Lungs- Clear to ausculation bilaterally, normal work of breathing Heart- Regular rate and rhythm, no murmurs, rubs or gallops, PMI not laterally displaced GI- soft, NT, ND, + BS Extremities- no edema. Neuro- strength and sensation are intact  EKG- today reveals sinus rhythm    Assessment and Plan:  1.H/O PTE/pulmonary hypertension Doing well with eliquis.  Should consider decreasing to 2.5mg  bid long term which is the recommended dose for long term DVT/PT prevention  2. SVT Resolved s/p ablation Can probably stop metoprolol in the future  3. Overweight Weight loss advised  Follow-up with Truitt Merle in 6 weeks As her arrhythmia issues are resolved, I will see as needed going forward.

## 2015-03-21 NOTE — Patient Instructions (Signed)
Medication Instructions:  None ordered  Labwork: None ordered  Testing/Procedures: None ordered  Follow-Up: Your physician recommends that you schedule a follow-up appointment in: 6 weeks with Truitt Merle, NP    Any Other Special Instructions Will Be Listed Below (If Applicable).

## 2015-03-22 ENCOUNTER — Encounter: Payer: Self-pay | Admitting: Pulmonary Disease

## 2015-03-24 DIAGNOSIS — I471 Supraventricular tachycardia: Secondary | ICD-10-CM | POA: Diagnosis not present

## 2015-03-24 DIAGNOSIS — J9611 Chronic respiratory failure with hypoxia: Secondary | ICD-10-CM | POA: Diagnosis not present

## 2015-03-24 DIAGNOSIS — M6281 Muscle weakness (generalized): Secondary | ICD-10-CM | POA: Diagnosis not present

## 2015-03-24 DIAGNOSIS — I509 Heart failure, unspecified: Secondary | ICD-10-CM | POA: Diagnosis not present

## 2015-03-24 DIAGNOSIS — I10 Essential (primary) hypertension: Secondary | ICD-10-CM | POA: Diagnosis not present

## 2015-03-24 DIAGNOSIS — I480 Paroxysmal atrial fibrillation: Secondary | ICD-10-CM | POA: Diagnosis not present

## 2015-03-27 ENCOUNTER — Telehealth: Payer: Self-pay

## 2015-03-27 NOTE — Telephone Encounter (Signed)
Danielle PT with [facility] left v/m requesting verbal orders for home health PT 2 x a week for 4 weeks.Please advise.

## 2015-03-27 NOTE — Telephone Encounter (Signed)
Please ok that verbal order  

## 2015-03-27 NOTE — Telephone Encounter (Signed)
Left voicemail giving Andee Poles the verbal okay for home health orders

## 2015-03-30 ENCOUNTER — Other Ambulatory Visit: Payer: Self-pay | Admitting: Internal Medicine

## 2015-04-02 ENCOUNTER — Encounter: Payer: Self-pay | Admitting: Nurse Practitioner

## 2015-04-03 ENCOUNTER — Telehealth: Payer: Self-pay | Admitting: Family Medicine

## 2015-04-03 ENCOUNTER — Ambulatory Visit: Payer: Medicare Other | Admitting: Family Medicine

## 2015-04-03 DIAGNOSIS — Z0289 Encounter for other administrative examinations: Secondary | ICD-10-CM

## 2015-04-03 NOTE — Telephone Encounter (Signed)
Patient did not come in for their appointment today for [name] follow up.  Please let me know if patient needs to be contacted immediately for follow up or no follow up needed.

## 2015-04-03 NOTE — Telephone Encounter (Signed)
She can re schedule when she feels good enough to get out

## 2015-04-06 ENCOUNTER — Other Ambulatory Visit: Payer: Self-pay | Admitting: Family Medicine

## 2015-04-06 NOTE — Telephone Encounter (Signed)
Will refill electronically  

## 2015-04-06 NOTE — Telephone Encounter (Signed)
Electronic refill request. Last Filled:    20 capsule 0 RF on 02/01/2015  Please advise.

## 2015-04-08 ENCOUNTER — Other Ambulatory Visit: Payer: Self-pay | Admitting: Internal Medicine

## 2015-04-09 ENCOUNTER — Telehealth: Payer: Self-pay

## 2015-04-09 ENCOUNTER — Telehealth: Payer: Self-pay | Admitting: *Deleted

## 2015-04-09 NOTE — Telephone Encounter (Signed)
LMTCO.

## 2015-04-09 NOTE — Telephone Encounter (Signed)
PA was already denied for patient. Appeal didn't help either. We will try Patient Assistance Program. Spoke with daughter. Will mail forms for patient to sign. They are coming back 04/07/2015 to see Dr. Rayann Heman.

## 2015-04-09 NOTE — Telephone Encounter (Signed)
Patients daughter called and stated that the eliquis is going to cost almost $400.00. Could she maybe need a prior auth?

## 2015-04-20 ENCOUNTER — Telehealth: Payer: Self-pay

## 2015-04-20 ENCOUNTER — Other Ambulatory Visit: Payer: Self-pay

## 2015-04-20 MED ORDER — APIXABAN 5 MG PO TABS: 5.0000 mg | ORAL_TABLET | Freq: Two times a day (BID) | ORAL | Status: DC

## 2015-04-20 NOTE — Telephone Encounter (Signed)
Patient daughter came to office to drop off patient asst form and for sample gave samples at front desk

## 2015-05-04 ENCOUNTER — Ambulatory Visit: Payer: Medicare Other | Admitting: Nurse Practitioner

## 2015-05-04 ENCOUNTER — Other Ambulatory Visit: Payer: Self-pay

## 2015-05-04 MED ORDER — POTASSIUM CHLORIDE ER 10 MEQ PO TBCR: 10.0000 meq | EXTENDED_RELEASE_TABLET | Freq: Every day | ORAL | Status: DC

## 2015-05-07 ENCOUNTER — Encounter: Payer: Self-pay | Admitting: Internal Medicine

## 2015-05-07 ENCOUNTER — Ambulatory Visit (INDEPENDENT_AMBULATORY_CARE_PROVIDER_SITE_OTHER): Payer: Medicare Other | Admitting: Internal Medicine

## 2015-05-07 ENCOUNTER — Other Ambulatory Visit: Payer: Self-pay | Admitting: Internal Medicine

## 2015-05-07 ENCOUNTER — Other Ambulatory Visit: Payer: Self-pay | Admitting: *Deleted

## 2015-05-07 VITALS — BP 128/64 | HR 62 | Ht 66.0 in | Wt 200.4 lb

## 2015-05-07 DIAGNOSIS — I471 Supraventricular tachycardia: Secondary | ICD-10-CM

## 2015-05-07 MED ORDER — FUROSEMIDE 20 MG PO TABS: 20.0000 mg | ORAL_TABLET | Freq: Every day | ORAL | Status: DC

## 2015-05-07 MED ORDER — BENZONATATE 200 MG PO CAPS: ORAL_CAPSULE | ORAL | Status: DC

## 2015-05-07 NOTE — Progress Notes (Signed)
PCP:  Loura Pardon, MD  The patient presents today for cardiology followup.  On eliquis for prior PTE.  SVT has resolved s/p ablation.   She has done well since without any further symptoms of arrhythmia. She denies SOB (above baseline) today.  Denies CP or any new concerns.   Past Medical History  Diagnosis Date  . Osteopenia   . Hyperlipidemia   . Hypertension   . PSVT (paroxysmal supraventricular tachycardia)   . Spinal stenosis   . Overactive bladder     urge incontinence  . Pulmonary emboli   . Pulmonary hypertension   . Right ventricular failure   . Arthritis     knee  . Anemia   . Diverticulosis   . Sleep apnea     CPAP  . Hx of colonic polyps 10/24/2014   Past Surgical History  Procedure Laterality Date  . Cataract extraction Bilateral 2008  . Bladder suspension  1993  . Total knee arthroplasty Left 2000  . Total knee revision  01/12/2012    Procedure: TOTAL KNEE REVISION;  Surgeon: Kerin Salen, MD;  Location: [place];  Service: Orthopedics;  Laterality: Left;  DEPUY/ LCS , HAND SET  . Trigger finger release  01/12/2012    Procedure: RELEASE TRIGGER FINGER/A-1 PULLEY;  Surgeon: Kerin Salen, MD;  Location: [place];  Service: Orthopedics;  Laterality: Right;  . Cholecystectomy    . Incisional hernia repair    . Total knee arthroplasty Left 10/29/2012    at [facility]  . Total knee arthroplasty Right 03/09/2014  . Colonoscopy    . Right heart catheterization N/A 07/04/2014    Procedure: RIGHT HEART CATH;  Surgeon: Jolaine Artist, MD;  Location: [facility] CATH LAB;  Service: Cardiovascular;  Laterality: N/A;  . Tonsillectomy and adenoidectomy    . Colonoscopy N/A 10/24/2014    Procedure: COLONOSCOPY;  Surgeon: Gatha Mayer, MD;  Location: [facility];  Service: Endoscopy;  Laterality: N/A;  . Cardiac catheterization N/A 02/28/2015    Procedure: Right/Left Heart Cath and Coronary Angiography;  Surgeon: Troy Sine, MD;  Location: [place] INVASIVE CV LAB CUPID;  Service:  Cardiovascular;  Laterality: N/A;  . Electrophysiologic study N/A 03/02/2015    Procedure: SVT Ablation;  Surgeon: Evans Lance, MD;  Location: [place] CV LAB;  Service: Cardiovascular;  Laterality: N/A;    Current Outpatient Prescriptions  Medication Sig Dispense Refill  . acetaminophen (TYLENOL) 325 MG tablet Take 2 tablets (650 mg total) by mouth every 4 (four) hours as needed for headache or mild pain.    Marland Kitchen apixaban (ELIQUIS) 5 MG TABS tablet Take 1 tablet (5 mg total) by mouth 2 (two) times daily. (Patient taking differently: Take by mouth 2 (two) times daily. ) 180 tablet 3  . benzonatate (TESSALON) 200 MG capsule TAKE ONE CAPSULE BY MOUTH TWICE DAILY ASNEEDED FOR COUGH 30 capsule 1  . Calcium Carb-Cholecalciferol (CALCIUM 600/VITAMIN D3) 600-800 MG-UNIT TABS Take 1 tablet by mouth 2 (two) times daily.    . furosemide (LASIX) 20 MG tablet Take 20 mg by mouth daily.     . metoprolol (LOPRESSOR) 50 MG tablet Take 0.5 tablets (25 mg total) by mouth 2 (two) times daily. 60 tablet 0  . Multiple Vitamin (MULTIVITAMIN WITH MINERALS) TABS tablet Take 1 tablet by mouth daily.    . polyethylene glycol (MIRALAX / GLYCOLAX) packet Take 8.5 g by mouth daily as needed (CONSTIPATOIN).     Marland Kitchen potassium chloride (K-DUR) 10 MEQ tablet Take  1 tablet (10 mEq total) by mouth daily. 90 tablet 3  . vitamin C (ASCORBIC ACID) 500 MG tablet Take 500 mg by mouth daily.     No current facility-administered medications for this visit.    Allergies  Allergen Reactions  . Zithromax [Azithromycin] Other (See Comments)    Prolonged QT on EKG  . Penicillins Hives  . Sulfonamide Derivatives Hives  . Adhesive [Tape] Other (See Comments)    redness  . Prednisone Other (See Comments)    Causes insomnia    History   Social History  . Marital Status: Widowed    Spouse Name: N/A  . Number of Children: 3  . Years of Education: N/A   Occupational History  . Retired   .     Social History Main Topics  .  Smoking status: Never Smoker   . Smokeless tobacco: Never Used  . Alcohol Use: No  . Drug Use: No  . Sexual Activity: No   Other Topics Concern  . Not on file   Social History Narrative   Pt lives alone but dtr lives on property and has been staying with her since surgery.    Family History  Problem Relation Age of Onset  . Breast cancer Mother   . Heart disease Father   . Diabetes Father   . Hypertension Father   . Uterine cancer Sister     Physical Exam: Filed Vitals:   05/07/15 1016  BP: 128/64  Pulse: 62  Height: 5\' 6"  (1.676 m)  Weight: 90.901 kg (200 lb 6.4 oz)    GEN- The patient is elderly appearing, alert and oriented x 3 today.   Head- normocephalic, atraumatic Eyes-  Sclera clear, conjunctiva pink Ears- hearing intact Oropharynx- clear Neck- supple,   Lungs- Clear to ausculation bilaterally, normal work of breathing Heart- Regular rate and rhythm, no murmurs, rubs or gallops, PMI not laterally displaced GI- soft, NT, ND, + BS Extremities- no edema. Neuro- strength and sensation are intact   Assessment and Plan:  1.H/O PTE/pulmonary hypertension Doing well with eliquis.  Should consider decreasing to 2.5mg  bid long term which is the recommended dose for long term DVT/PT prevention.  I will defer this change to pulmonary who will manage long term  2. SVT Resolved s/p ablation Stop metoprolol today  3. Overweight Weight loss advised  As her arrhythmia issues are resolved, I will see as needed going forward.

## 2015-05-07 NOTE — Telephone Encounter (Signed)
Received fax requesting 90 day Rx from mail order pharmacy, please advise

## 2015-05-07 NOTE — Addendum Note (Signed)
Addended by: Alvis Lemmings C on: 05/07/2015 10:53 AM   Modules accepted: Orders, Medications

## 2015-05-07 NOTE — Telephone Encounter (Signed)
Please refill times 3 for chronic cough

## 2015-05-07 NOTE — Patient Instructions (Signed)
Medication Instructions: - Stop metoprolol  Labwork: - none  Procedures/Testing: - none  Follow-Up: - Dr. Rayann Heman will see you back on an as needed basis.  Any Additional Special Instructions Will Be Listed Below (If Applicable). - none

## 2015-05-07 NOTE — Telephone Encounter (Signed)
done

## 2015-05-08 ENCOUNTER — Telehealth: Payer: Self-pay

## 2015-05-08 NOTE — Telephone Encounter (Signed)
Spoke with patient today about hr Eliquis. Advised her PA forms were received by the [employer] And we should have an answer soon. Patient voiced understanding.

## 2015-05-16 ENCOUNTER — Telehealth: Payer: Self-pay

## 2015-05-16 NOTE — Telephone Encounter (Signed)
Patient has been approved for Patient Assist for her Eliqus 5mg  free of charge. Good now through 10/27/2015. She expressed appreciation.

## 2015-06-12 ENCOUNTER — Other Ambulatory Visit: Payer: Self-pay | Admitting: *Deleted

## 2015-06-12 MED ORDER — POTASSIUM CHLORIDE ER 10 MEQ PO TBCR: 10.0000 meq | EXTENDED_RELEASE_TABLET | Freq: Every day | ORAL | Status: DC

## 2015-06-25 ENCOUNTER — Ambulatory Visit (INDEPENDENT_AMBULATORY_CARE_PROVIDER_SITE_OTHER): Payer: Medicare Other | Admitting: Adult Health

## 2015-06-25 ENCOUNTER — Encounter: Payer: Self-pay | Admitting: Adult Health

## 2015-06-25 VITALS — BP 126/84 | HR 67 | Temp 97.6°F | Ht 66.0 in | Wt 203.0 lb

## 2015-06-25 DIAGNOSIS — I270 Primary pulmonary hypertension: Secondary | ICD-10-CM

## 2015-06-25 DIAGNOSIS — G4733 Obstructive sleep apnea (adult) (pediatric): Secondary | ICD-10-CM

## 2015-06-25 DIAGNOSIS — I272 Pulmonary hypertension, unspecified: Secondary | ICD-10-CM

## 2015-06-25 DIAGNOSIS — I2699 Other pulmonary embolism without acute cor pulmonale: Secondary | ICD-10-CM | POA: Diagnosis not present

## 2015-06-25 NOTE — Progress Notes (Signed)
Subjective:    Patient ID: [name], female    DOB: 1934/05/12, 81 y.o.   MRN: [id number]  HPI  81 year old woman with pulmonary hypertension, OSA and hypoxic RF on O2 (w/ CPAP) , PE , Cor Pulmonale and PSVT /A-Fib s/p Ablation 02/2015   She had a complicated course after left knee replacement in 2013 due to a DVT/PE. Tx w/ anticoagulation >1 year, Off Xarelto Dec 2015.     06/25/2015 Follow up : Cor Pulmonale, Pulmonary HTN, , OSA on CPAP and O2.  Pt returns for 4 month follow up .  Says overall she is doing okay.  She is on nocturnal CPAP with O2 for severe OSA .  Says she is doing well with this. Wears for 6hrs each night .  Download shows good compliance On CPAP with avg usage ~5hr . ., AHI 1.5  Feels rested with no sign daytime sleepiness.   She has hx of Atrial Fib and PSVT w/ recent ablation in May this year . Followed by Dr. Rayann Heman.  She remains on Eliquis for this.   Previous PE 10/2012  in setting of TKR with normal follow up CT chest and VQ scan . Tx with over 1.5  years of Xarelto . This was stopped in 09/2014.   She has mild pulmonary HTN on RHC with PAP at 25 . Felt secondary to severe OSA/OHS .  She is compliant with CPAP and nocturnal O2.     Review of Systems Constitutional:   No  weight loss, night sweats,  Fevers, chills,  +fatigue, or  lassitude.  HEENT:   No headaches,  Difficulty swallowing,  Tooth/dental problems, or  Sore throat,                No sneezing, itching, ear ache, nasal congestion, post nasal drip,   CV:  No chest pain,  Orthopnea, PND,  , anasarca, dizziness, palpitations, syncope.   GI  No heartburn, indigestion, abdominal pain, nausea, vomiting, diarrhea, change in bowel habits, loss of appetite, bloody stools.   Resp .  No excess mucus, no productive cough,  No non-productive cough,  No coughing up of blood.  No change in color of mucus.  No wheezing.  No chest wall deformity  Skin: no rash or lesions.  GU: no dysuria, change in  color of urine, no urgency or frequency.  No flank pain, no hematuria   MS:  No joint pain or swelling.  No decreased range of motion.  No back pain.  Psych:  No change in mood or affect. No depression or anxiety.  No memory loss.         Objective:   Physical Exam GEN: A/Ox3; pleasant , NAD, elderly   HEENT:  [place]/AT,  EACs-clear, TMs-wnl, NOSE-clear, THROAT-clear, no lesions, no postnasal drip or exudate noted.   NECK:  Supple w/ fair ROM; no JVD; normal carotid impulses w/o bruits; no thyromegaly or nodules palpated; no lymphadenopathy.  RESP  Decreased BS in bases w/o wheezes/ rales/ or rhonchi.no accessory muscle use, no dullness to percussion  CARD:  RRR, no m/r/g  , tr  peripheral edema, pulses intact, no cyanosis or clubbing.  GI:   Soft & nt; nml bowel sounds; no organomegaly or masses detected.  Musco: Warm bil, no deformities or joint swelling noted.   Neuro: alert, no focal deficits noted.    Skin: Warm, no lesions or rashes         Assessment & Plan:

## 2015-06-25 NOTE — Patient Instructions (Addendum)
Continue on CPAP with oxygen At bedtime  -try to wear all night.  Will contact you regarding Eliquis dose.  Follow up Dr. Elsworth Soho  In 4 months and As needed

## 2015-06-27 NOTE — Assessment & Plan Note (Signed)
Previous Provoked  PE 10/2012  in setting of TKR with normal follow up CT chest and VQ scan . Tx with over 1.5 years of Xarelto . This was stopped in 09/2014.    She is now on Eliquis per cardiology for Afib and PVST -cont with cards follow up for anticoagulation tx.

## 2015-06-27 NOTE — Assessment & Plan Note (Signed)
Mild Pulmonary HTN per RHC , with underlying OSA -doing well on CPAP and O2.  Cont current regimen

## 2015-06-27 NOTE — Assessment & Plan Note (Signed)
Doing well on CPAP w/ O2 , good compliance  Encouraged to increased sleep hours >6hr if able  Cont on current regimen  Wt loss

## 2015-06-28 NOTE — Progress Notes (Signed)
Reviewed & agree with plan  

## 2015-07-03 ENCOUNTER — Other Ambulatory Visit: Payer: Self-pay | Admitting: Family Medicine

## 2015-07-03 DIAGNOSIS — Z1231 Encounter for screening mammogram for malignant neoplasm of breast: Secondary | ICD-10-CM

## 2015-07-11 ENCOUNTER — Ambulatory Visit (INDEPENDENT_AMBULATORY_CARE_PROVIDER_SITE_OTHER): Payer: Medicare Other | Admitting: Family Medicine

## 2015-07-11 ENCOUNTER — Encounter: Payer: Self-pay | Admitting: Family Medicine

## 2015-07-11 VITALS — BP 132/76 | HR 67 | Temp 97.7°F | Ht 66.0 in | Wt 203.5 lb

## 2015-07-11 DIAGNOSIS — L089 Local infection of the skin and subcutaneous tissue, unspecified: Secondary | ICD-10-CM

## 2015-07-11 DIAGNOSIS — W57XXXA Bitten or stung by nonvenomous insect and other nonvenomous arthropods, initial encounter: Secondary | ICD-10-CM

## 2015-07-11 DIAGNOSIS — T148 Other injury of unspecified body region: Secondary | ICD-10-CM

## 2015-07-11 DIAGNOSIS — Z23 Encounter for immunization: Secondary | ICD-10-CM

## 2015-07-11 MED ORDER — DOXYCYCLINE HYCLATE 100 MG PO TABS: 100.0000 mg | ORAL_TABLET | Freq: Two times a day (BID) | ORAL | Status: DC

## 2015-07-11 NOTE — Progress Notes (Signed)
Subjective:    Patient ID: [name], female    DOB: [birth year], 81 y.o.   MRN: [id number]  HPI Here for L ankle swelling   Was worse yesterday   Started with an insect bite  On medial ankle  Whole ankle was red and warm and swollen  Did not itch a lot - but was sore to the touch  Feels normal  No fever   Has not pulled any ticks off   Patient Active Problem List   Diagnosis Date Noted  . Insect bite, infected 07/11/2015  . Pulmonary hypertension   . Chest pain 02/25/2015  . Physical deconditioning 02/25/2015  . RBBB-new 02/12/2015  . History of bilat PE after TKR (off Coumadin Dec 2015) 02/12/2015  . Abnormal EKG-TWI, RBBB, prolonged QT   . Hernia, umbilical 79/48/0165  . Hx of colonic polyps 10/24/2014  . Diverticulosis of colon without hemorrhage   . OSA (obstructive sleep apnea) 07/22/2014  . Unspecified arthropathy, lower leg 03/21/2014  . History of right knee joint replacement 03/15/2014  . Solitary pulmonary nodule 05/03/2013  . Acute cor pulmonale 04/26/2013  . Sternal fracture 04/26/2013  . Acute right ventricular heart failure 12/09/2012  . PE (pulmonary embolism) 11/24/2012  . DVT (deep venous thrombosis) 11/24/2012  . PES PLANUS 08/15/2010  . URINARY FREQUENCY, CHRONIC 07/14/2008  . Dyslipidemia 07/09/2007  . Essential hypertension 07/09/2007  . PSVT with SSS 02/23/2007  . SCLERODERMA 02/23/2007  . DEGENERATIVE JOINT DISEASE 02/23/2007  . SPINAL STENOSIS, LUMBAR 02/23/2007  . OSTEOPENIA 02/23/2007  . SPONDYLOLISTHESIS 02/23/2007   Past Medical History  Diagnosis Date  . Osteopenia   . Hyperlipidemia   . Hypertension   . PSVT (paroxysmal supraventricular tachycardia)   . Spinal stenosis   . Overactive bladder     urge incontinence  . Pulmonary emboli   . Pulmonary hypertension   . Right ventricular failure   . Arthritis     knee  . Anemia   . Diverticulosis   . Sleep apnea     CPAP  . Hx of colonic polyps 10/24/2014   Past  Surgical History  Procedure Laterality Date  . Cataract extraction Bilateral 2008  . Bladder suspension  1993  . Total knee arthroplasty Left 2000  . Total knee revision  01/12/2012    Procedure: TOTAL KNEE REVISION;  Surgeon: Kerin Salen, MD;  Location: [facility] [facility];  Service: Orthopedics;  Laterality: Left;  DEPUY/ LCS , HAND SET  . Trigger finger release  01/12/2012    Procedure: RELEASE TRIGGER FINGER/A-1 PULLEY;  Surgeon: Kerin Salen, MD;  Location: [place];  Service: Orthopedics;  Laterality: Right;  . Cholecystectomy    . Incisional hernia repair    . Total knee arthroplasty Left 10/29/2012    at [facility]  . Total knee arthroplasty Right 03/09/2014  . Colonoscopy    . Right heart catheterization N/A 07/04/2014    Procedure: RIGHT HEART CATH;  Surgeon: Jolaine Artist, MD;  Location: [facility] CATH LAB;  Service: Cardiovascular;  Laterality: N/A;  . Tonsillectomy and adenoidectomy    . Colonoscopy N/A 10/24/2014    Procedure: COLONOSCOPY;  Surgeon: Gatha Mayer, MD;  Location: [facility] ENDOSCOPY;  Service: Endoscopy;  Laterality: N/A;  . Cardiac catheterization N/A 02/28/2015    Procedure: Right/Left Heart Cath and Coronary Angiography;  Surgeon: Troy Sine, MD;  Location: [facility] INVASIVE CV LAB CUPID;  Service: Cardiovascular;  Laterality: N/A;  . Electrophysiologic study N/A 03/02/2015    Procedure:  SVT Ablation;  Surgeon: Evans Lance, MD;  Location: [facility] CV LAB;  Service: Cardiovascular;  Laterality: N/A;   Social History  Substance Use Topics  . Smoking status: Never Smoker   . Smokeless tobacco: Never Used  . Alcohol Use: No   Family History  Problem Relation Age of Onset  . Breast cancer Mother   . Heart disease Father   . Diabetes Father   . Hypertension Father   . Uterine cancer Sister    Allergies  Allergen Reactions  . Zithromax [Azithromycin] Other (See Comments)    Prolonged QT on EKG  . Penicillins Hives  . Sulfonamide Derivatives Hives  . Adhesive [Tape] Other (See  Comments)    redness  . Prednisone Other (See Comments)    Causes insomnia   Current Outpatient Prescriptions on File Prior to Visit  Medication Sig Dispense Refill  . acetaminophen (TYLENOL) 325 MG tablet Take 2 tablets (650 mg total) by mouth every 4 (four) hours as needed for headache or mild pain.    Marland Kitchen apixaban (ELIQUIS) 5 MG TABS tablet Take 1 tablet (5 mg total) by mouth 2 (two) times daily. (Patient taking differently: Take by mouth 2 (two) times daily. ) 180 tablet 3  . benzonatate (TESSALON) 200 MG capsule TAKE ONE CAPSULE BY MOUTH TWICE DAILY ASNEEDED FOR COUGH 180 capsule 2  . Calcium Carb-Cholecalciferol (CALCIUM 600/VITAMIN D3) 600-800 MG-UNIT TABS Take 1 tablet by mouth 2 (two) times daily.    . furosemide (LASIX) 20 MG tablet Take 1 tablet (20 mg total) by mouth daily. 90 tablet 2  . Multiple Vitamin (MULTIVITAMIN WITH MINERALS) TABS tablet Take 1 tablet by mouth daily.    . naproxen sodium (ANAPROX) 220 MG tablet Take 220 mg by mouth daily as needed.    . polyethylene glycol (MIRALAX / GLYCOLAX) packet Take 8.5 g by mouth daily as needed (CONSTIPATOIN).     Marland Kitchen potassium chloride (K-DUR) 10 MEQ tablet Take 1 tablet (10 mEq total) by mouth daily. 90 tablet 2  . vitamin C (ASCORBIC ACID) 500 MG tablet Take 500 mg by mouth daily.     No current facility-administered medications on file prior to visit.    Review of Systems Review of Systems  Constitutional: Negative for fever, appetite change, fatigue and unexpected weight change.  Eyes: Negative for pain and visual disturbance.  Respiratory: Negative for cough and shortness of breath.   Cardiovascular: Negative for cp or palpitations    Gastrointestinal: Negative for nausea, diarrhea and constipation.  Genitourinary: Negative for urgency and frequency.  Skin: Negative for pallor or rash  pos for itchy insect bites on legs and swelling of one on L ankle  Neurological: Negative for weakness, light-headedness, numbness and  headaches.  Hematological: Negative for adenopathy. Does not bruise/bleed easily.  Psychiatric/Behavioral: Negative for dysphoric mood. The patient is not nervous/anxious.         Objective:   Physical Exam  Constitutional: She appears well-developed and well-nourished. No distress.  obese and well appearing   HENT:  Head: Normocephalic and atraumatic.  Mouth/Throat: Oropharynx is clear and moist.  Eyes: Conjunctivae and EOM are normal. Pupils are equal, round, and reactive to light. Right eye exhibits no discharge. Left eye exhibits no discharge. No scleral icterus.  Neck: Normal range of motion. Neck supple.  Cardiovascular: Normal rate and regular rhythm.   Pulmonary/Chest: Effort normal and breath sounds normal. No respiratory distress. She has no wheezes.  Lymphadenopathy:    She has no  cervical adenopathy.  Neurological: She is alert.  Skin: Skin is warm and dry. No rash noted. There is erythema. No pallor.  Several excoriated insect bites on upper L leg   Infected app bite on L medial ankle with 2-3 cm of erythema and warmth-no drainage or fluctuance Some spider veins in area without ulceration     Psychiatric: She has a normal mood and affect.          Assessment & Plan:   Problem List Items Addressed This Visit      Other   Insect bite, infected - Primary    Several bites on legs, the only one with swelling and warmth is on medial L ankle inst to clean with soap and water Disc symptomatic care - see instructions on AVS  Cover with doxycyline Update if not starting to improve in a week or if worsening

## 2015-07-11 NOTE — Patient Instructions (Signed)
Try not to scratch bug bites Keep them clean with soap and water  Rubbing alcohol is fine Keep them cool  Take the doxycycline for infected bite on ankle Elevate it when able  Update if not starting to improve in a week or if worsening  - if it develops streaks of redness let me know

## 2015-07-11 NOTE — Progress Notes (Signed)
Pre visit review using our clinic review tool, if applicable. No additional management support is needed unless otherwise documented below in the visit note. 

## 2015-07-11 NOTE — Assessment & Plan Note (Signed)
Several bites on legs, the only one with swelling and warmth is on medial L ankle inst to clean with soap and water Disc symptomatic care - see instructions on AVS  Cover with doxycyline Update if not starting to improve in a week or if worsening

## 2015-07-11 NOTE — Addendum Note (Signed)
Addended by: Tammi Sou on: 07/11/2015 02:01 PM   Modules accepted: Orders

## 2015-07-13 ENCOUNTER — Ambulatory Visit (HOSPITAL_COMMUNITY): Admission: RE | Admit: 2015-07-13 | Payer: Medicare Other | Source: Ambulatory Visit

## 2015-07-18 ENCOUNTER — Ambulatory Visit (HOSPITAL_COMMUNITY)
Admission: RE | Admit: 2015-07-18 | Discharge: 2015-07-18 | Disposition: A | Discharge status: Home / Self Care | Payer: Medicare Other | Source: Ambulatory Visit | Attending: Family Medicine | Admitting: Family Medicine

## 2015-07-18 DIAGNOSIS — Z1231 Encounter for screening mammogram for malignant neoplasm of breast: Secondary | ICD-10-CM | POA: Diagnosis present

## 2015-07-18 LAB — HM MAMMOGRAPHY: HM Mammogram: NORMAL

## 2015-07-19 ENCOUNTER — Encounter: Payer: Self-pay | Admitting: Family Medicine

## 2015-07-19 ENCOUNTER — Encounter: Payer: Self-pay | Admitting: *Deleted

## 2015-07-20 ENCOUNTER — Telehealth: Payer: Self-pay | Admitting: Family Medicine

## 2015-07-20 NOTE — Telephone Encounter (Signed)
[facility] [name] DOB: [birth year] Initial Comment Caller states her mother has a swollen ankle that is hot to the touch and painful Nurse Assessment Nurse: Martyn Ehrich, RN, Felicia Date/Time (Eastern Time): 07/20/2015 3:45:04 PM Confirm and document reason for call. If symptomatic, describe symptoms. ---Pt has a swelling and pain in L ankle and saw MD Weds and MD put her on doxycycline - she took last dose today and it has improved some but still painful to touch - when she walks on it it swells moderately - goes away overnight. Not diabetic. No fever Has the patient traveled out of the country within the last 30 days? ---No Does the patient require triage? ---Yes Related visit to physician within the last 2 weeks? ---Yes Does the PT have any chronic conditions? (i.e. diabetes, asthma, etc.) ---YesList chronic conditions. ---sleep apnea Guidelines Guideline Title Affirmed Question Affirmed Notes Tick Bite [1] Red streak or red line AND [2] length > 2 inches (5 cm) Final Disposition User See Physician within 4 Hours (or PCP triage) Martyn Ehrich, RN, Solmon Ice Comments MD said she was attributing it to an insect bite - nothing she saw and she has not seen ticks on her no fever traiged for tick bite bc treatment fits though cause is unknown - now she says she saw MD the week prior to this week and will take last pill tonight. red purple area 2-3 " across Referrals GO TO FACILITY UNDECIDED Disagree/Comply: Comply Call Id: [id number]

## 2015-07-20 NOTE — Telephone Encounter (Signed)
PLEASE NOTE: All timestamps contained within this report are represented as [place] Standard Time. CONFIDENTIALTY NOTICE: This fax transmission is intended only for the addressee. It contains information that is legally privileged, confidential or otherwise protected from use or disclosure. If you are not the intended recipient, you are strictly prohibited from reviewing, disclosing, copying using or disseminating any of this information or taking any action in reliance on or regarding this information. If you have received this fax in error, please notify us immediately by telephone so that we can arrange for its return to [place]. Phone: [phone], Toll-Free: [phone], Fax: [phone] Page: 1 of 2 Call Id: [id number] [place] [name] Gender: Female DOB: September 29, 1934 Age: 81 Y 35 M 12 D Return Phone Number: [phone] (Primary) Address: City/State/Zip: [place] Client [facility] - Client Client Site [facility] - [facility] [place], [place] Contact Type Call Call Type Triage / Clinical Caller Name [name] Relationship To Patient Daughter Appointment Disposition EMR Appointment Attempted - Not Scheduled Info pasted into Epic Yes Return Phone Number [phone] (Primary) Chief Complaint Foot Pain Initial Comment Caller states her mother has a swollen ankle that is hot to the touch and painful [place] Not Listed they will choose UC in [place] - on [address] or [address] PreDisposition Call Doctor Nurse Assessment Nurse: Martyn Ehrich, RN, Solmon Ice Date/Time (Eastern Time): 07/20/2015 3:45:04 PM Confirm and document reason for call. If symptomatic, describe symptoms. ---Pt has a swelling and pain in L ankle and saw MD Weds and MD put her on doxycycline - she took last dose today and it has improved some but still painful to touch -  when she walks on it it swells moderately - goes away overnight. Not diabetic. No fever Has the patient traveled out of the country within the last 30 days? ---No Does the patient require triage? ---Yes Related visit to physician within the last 2 weeks? ---Yes Does the PT have any chronic conditions? (i.e. diabetes, asthma, etc.) ---Yes List chronic conditions. ---sleep apnea Guidelines Guideline Title Affirmed Question Affirmed Notes Nurse Date/Time (Eastern Time) Tick Bite [1] Red streak or red line AND [2] length > 2 inches (5 cm) Gaddy, RN, Felicia 05/08/4579 79:98:33 PM Disp. Time Eilene Ghazi Time) Disposition Final User 07/20/2015 4:12:06 PM Call Completed Martyn Ehrich RN, Solmon Ice 06/20/538 79:67:34 PM See Physician within 4 Hours (or PCP triage) Yes Martyn Ehrich, RN, Felicia PLEASE NOTE: All timestamps contained within this report are represented as [place] Standard Time. CONFIDENTIALTY NOTICE: This fax transmission is intended only for the addressee. It contains information that is legally privileged, confidential or otherwise protected from use or disclosure. If you are not the intended recipient, you are strictly prohibited from reviewing, disclosing, copying using or disseminating any of this information or taking any action in reliance on or regarding this information. If you have received this fax in error, please notify us immediately by telephone so that we can arrange for its return to [place]. Phone: [phone], Toll-Free: [phone], Fax: [phone] Page: 2 of 2 Call Id: [id number] Caller Understands: Yes Disagree/Comply: Comply Care Advice Given Per Guideline SEE PHYSICIAN WITHIN 4 HOURS (or PCP triage): * You become worse. After Care Instructions Given Call Event Type User Date / Time Description Comments User: Daphene Calamity, RN Date/Time Eilene Ghazi Time): 07/20/2015 3:48:08 PM MD said she was attributing it to an insect bite - nothing she saw and she  has not seen ticks on  her User: Daphene Calamity, RN Date/Time Eilene Ghazi Time): 07/20/2015 3:48:40 PM no fever User: Daphene Calamity, RN Date/Time (Eastern Time): 07/20/2015 3:57:19 PM traiged for tick bite bc treatment fits though cause is unknown - now she says she saw MD the week prior to this week and will take last pill tonight. red purple area 2-3 " across Referrals GO TO FACILITY UNDECIDED

## 2015-07-20 NOTE — Telephone Encounter (Signed)
Spoke with Shauna Hugh; [name] said was advised by [facility] to take pt to urgent care; taking her to [facility] in [place] and will cb next week with update.

## 2015-08-27 ENCOUNTER — Ambulatory Visit (INDEPENDENT_AMBULATORY_CARE_PROVIDER_SITE_OTHER): Payer: Medicare Other | Admitting: Primary Care

## 2015-08-27 ENCOUNTER — Encounter: Payer: Self-pay | Admitting: Primary Care

## 2015-08-27 VITALS — BP 126/82 | HR 79 | Temp 97.8°F | Ht 66.0 in | Wt 206.0 lb

## 2015-08-27 DIAGNOSIS — M25562 Pain in left knee: Secondary | ICD-10-CM | POA: Diagnosis not present

## 2015-08-27 NOTE — Progress Notes (Signed)
Subjective:    Patient ID: [name], female    DOB: 1934-09-10, 81 y.o.   MRN: [id number]  HPI  [name] is a 81 year old female who presents today for follow up from Urgent Care visit. She was evaluated at [facility] on Saturday October 29th. She presented with a chief complaint of left knee pain and foul odor to her urine.   1) Urinary Tract Infection: Presented to [facility] for foul smelling odor and no other symptoms. She was diagnosed with a UTI and was provided with a prescription for Macrobid. She's been taking her medication as prescribed. Denies dysuria, hematuria, frequency, abdominal pain. She's noticed an improvement in her foul smelling urine.  2) Knee pain: Present to the left knee. Chronic pain with worsening pain all last week. She walks with a cane normally but has had to use her walker for the past week. She has a history of 3 knee surgeries to her left side with arthroscopy completed recently. She also had joint effusion to left knee with withdrawal of fluid recently. Denies recent injury or fall. She had an xray of her left knee at Urgent Care and was advised to contact her orthopedic surgeon. She has an appointment with her orthopedist this Thursday. She's been taking aleve for her pain at bedtime with some improvement.   Review of Systems  Constitutional: Negative for fever.  Genitourinary: Negative for dysuria and hematuria.  Musculoskeletal: Positive for arthralgias and gait problem.  Neurological: Negative for dizziness.       Past Medical History  Diagnosis Date  . Osteopenia   . Hyperlipidemia   . Hypertension   . PSVT (paroxysmal supraventricular tachycardia) ([place])   . Spinal stenosis   . Overactive bladder     urge incontinence  . Pulmonary emboli ([place])   . Pulmonary hypertension ([place])   . Right ventricular failure ([place])   . Arthritis     knee  . Anemia   . Diverticulosis   . Sleep apnea     CPAP  . Hx of colonic polyps  10/24/2014    Social History   Social History  . Marital Status: Widowed    Spouse Name: N/A  . Number of Children: 3  . Years of Education: N/A   Occupational History  . Retired   .     Social History Main Topics  . Smoking status: Never Smoker   . Smokeless tobacco: Never Used  . Alcohol Use: No  . Drug Use: No  . Sexual Activity: No   Other Topics Concern  . Not on file   Social History Narrative   Pt lives alone but dtr lives on property and has been staying with her since surgery.    Past Surgical History  Procedure Laterality Date  . Cataract extraction Bilateral 2008  . Bladder suspension  1993  . Total knee arthroplasty Left 2000  . Total knee revision  01/12/2012    Procedure: TOTAL KNEE REVISION;  Surgeon: Kerin Salen, MD;  Location: [place];  Service: Orthopedics;  Laterality: Left;  DEPUY/ LCS , HAND SET  . Trigger finger release  01/12/2012    Procedure: RELEASE TRIGGER FINGER/A-1 PULLEY;  Surgeon: Kerin Salen, MD;  Location: [place];  Service: Orthopedics;  Laterality: Right;  . Cholecystectomy    . Incisional hernia repair    . Total knee arthroplasty Left 10/29/2012    at [facility]  . Total knee arthroplasty  Right 03/09/2014  . Colonoscopy    . Right heart catheterization N/A 07/04/2014    Procedure: RIGHT HEART CATH;  Surgeon: Jolaine Artist, MD;  Location: [facility] CATH LAB;  Service: Cardiovascular;  Laterality: N/A;  . Tonsillectomy and adenoidectomy    . Colonoscopy N/A 10/24/2014    Procedure: COLONOSCOPY;  Surgeon: Gatha Mayer, MD;  Location: [facility] ENDOSCOPY;  Service: Endoscopy;  Laterality: N/A;  . Cardiac catheterization N/A 02/28/2015    Procedure: Right/Left Heart Cath and Coronary Angiography;  Surgeon: Troy Sine, MD;  Location: [place] INVASIVE CV LAB CUPID;  Service: Cardiovascular;  Laterality: N/A;  . Electrophysiologic study N/A 03/02/2015    Procedure: SVT Ablation;  Surgeon: Evans Lance, MD;  Location: [place] CV LAB;  Service:  Cardiovascular;  Laterality: N/A;    Family History  Problem Relation Age of Onset  . Breast cancer Mother   . Heart disease Father   . Diabetes Father   . Hypertension Father   . Uterine cancer Sister     Allergies  Allergen Reactions  . Zithromax [Azithromycin] Other (See Comments)    Prolonged QT on EKG  . Penicillins Hives  . Sulfonamide Derivatives Hives  . Adhesive [Tape] Other (See Comments)    redness  . Prednisone Other (See Comments)    Causes insomnia    Current Outpatient Prescriptions on File Prior to Visit  Medication Sig Dispense Refill  . acetaminophen (TYLENOL) 325 MG tablet Take 2 tablets (650 mg total) by mouth every 4 (four) hours as needed for headache or mild pain.    Marland Kitchen apixaban (ELIQUIS) 5 MG TABS tablet Take 1 tablet (5 mg total) by mouth 2 (two) times daily. (Patient taking differently: Take by mouth 2 (two) times daily. ) 180 tablet 3  . benzonatate (TESSALON) 200 MG capsule TAKE ONE CAPSULE BY MOUTH TWICE DAILY ASNEEDED FOR COUGH 180 capsule 2  . Calcium Carb-Cholecalciferol (CALCIUM 600/VITAMIN D3) 600-800 MG-UNIT TABS Take 1 tablet by mouth 2 (two) times daily.    . furosemide (LASIX) 20 MG tablet Take 1 tablet (20 mg total) by mouth daily. 90 tablet 2  . Multiple Vitamin (MULTIVITAMIN WITH MINERALS) TABS tablet Take 1 tablet by mouth daily.    . naproxen sodium (ANAPROX) 220 MG tablet Take 220 mg by mouth daily as needed.    . polyethylene glycol (MIRALAX / GLYCOLAX) packet Take 8.5 g by mouth daily as needed (CONSTIPATOIN).     Marland Kitchen potassium chloride (K-DUR) 10 MEQ tablet Take 1 tablet (10 mEq total) by mouth daily. 90 tablet 2  . vitamin C (ASCORBIC ACID) 500 MG tablet Take 500 mg by mouth daily.     No current facility-administered medications on file prior to visit.    BP 126/82 mmHg  Pulse 79  Temp(Src) 97.8 F (36.6 C) (Oral)  Ht 5\' 6"  (1.676 m)  Wt 206 lb (93.441 kg)  BMI 33.27 kg/m2  SpO2 92%    Objective:   Physical Exam    Constitutional: She appears well-nourished.  Cardiovascular: Normal rate and regular rhythm.   Pulmonary/Chest: Effort normal and breath sounds normal.  Abdominal: Soft. There is no CVA tenderness.  Musculoskeletal: She exhibits tenderness.       Left knee: She exhibits swelling. She exhibits no deformity and no erythema. Tenderness found.  Moderate swelling to left knee which patient states is chronic. No erythema. Tender just lateral to left patella, doesn't seem to be ligamental. Decreased ROM to left knee due to pain.  Slight DTR to left knee.  Skin: Skin is warm and dry.          Assessment & Plan:  Urgent Care Follow Up:  1) UTI: Currently taking Macrobid as prescribed. Improvement in foul smelling urine. No other urinary symptoms, fevers, abdominal pain. Continue Macrobid, increase consumption of water. Appears well overall.  2) Knee pain: History of numerous surgeries with recent arthroscopy and joint effusion. Left knee pain with chronic swelling. Tender to left lateral patella. Moderate reduction in ROM to left knee due to pain, ambulatory in clinic with walker. She has a CD of xrays from urgent care, but could not get it to open on our computers. She has appointment with orthopedist this Thursday. Continue aleve HS PRN. Discussed supportive treatment and to call if any fevers or increased pain. Declined any stronger pain medication today.

## 2015-08-27 NOTE — Progress Notes (Signed)
Pre visit review using our clinic review tool, if applicable. No additional management support is needed unless otherwise documented below in the visit note. 

## 2015-08-27 NOTE — Patient Instructions (Signed)
Please see your orthopedist doctor this Thursday as scheduled. They may need to pull off additional fluid.  You may continue to take the Aleve as discussed for inflammation and pain.  Continue taking Macrobid medication for urinary tract infection until this medication is gone.  Ensure you are staying hydrated with water.  It was a pleasure meeting you!

## 2015-08-30 DIAGNOSIS — M25469 Effusion, unspecified knee: Secondary | ICD-10-CM | POA: Insufficient documentation

## 2015-09-08 DIAGNOSIS — J690 Pneumonitis due to inhalation of food and vomit: Secondary | ICD-10-CM | POA: Insufficient documentation

## 2015-09-10 ENCOUNTER — Telehealth: Payer: Self-pay

## 2015-09-10 MED ORDER — HYDROCHLOROTHIAZIDE 25 MG PO TABS: 25.0000 mg | ORAL_TABLET | Freq: Every day | ORAL | Status: DC

## 2015-09-10 NOTE — Telephone Encounter (Signed)
I just sent it in.

## 2015-09-10 NOTE — Telephone Encounter (Signed)
[name]  [name] left v/m (DPR signed) left v/m; pt was discharged from [facility]; pt was started on HCTZ 25 mg taking one daily; pt did not get rx upon discharge. [name] request HCTZ sent to [facility]. Pt has hospital f/u appt on 09/12/15. Please advise.

## 2015-09-10 NOTE — Telephone Encounter (Signed)
Pt notified Rx sent to pharmacy

## 2015-09-12 ENCOUNTER — Encounter: Payer: Self-pay | Admitting: Family Medicine

## 2015-09-12 ENCOUNTER — Ambulatory Visit (INDEPENDENT_AMBULATORY_CARE_PROVIDER_SITE_OTHER): Payer: Medicare Other | Admitting: Family Medicine

## 2015-09-12 VITALS — BP 118/66 | HR 61 | Temp 98.6°F | Ht 66.0 in

## 2015-09-12 DIAGNOSIS — J189 Pneumonia, unspecified organism: Secondary | ICD-10-CM | POA: Diagnosis not present

## 2015-09-12 DIAGNOSIS — I10 Essential (primary) hypertension: Secondary | ICD-10-CM | POA: Diagnosis not present

## 2015-09-12 DIAGNOSIS — T8454XA Infection and inflammatory reaction due to internal left knee prosthesis, initial encounter: Secondary | ICD-10-CM | POA: Diagnosis not present

## 2015-09-12 DIAGNOSIS — Y95 Nosocomial condition: Principal | ICD-10-CM

## 2015-09-12 NOTE — Patient Instructions (Signed)
If you want to start back on vitamin c - get a chewable kind  Hold the potassium  Follow up in about 2 weeks -we will get a chest xray and some labs then also  Keep up with your therapy and continue thickened liquids for now  Alert me if anything changes

## 2015-09-12 NOTE — Progress Notes (Signed)
Subjective:    Patient ID: [name], female    DOB: 1934/04/22, 81 y.o.   MRN: [id number]  HPI Pt is here for hospital f/u at [facility] from 11/3-11/11 for the following conditions  Staph arthritis of L knee (not mrsa) Aspiration pneumonia bilat  afib with RVR  The afib was stopped with diltiazem quickly  Pt had knee I and D with JP drain by orthopedics (followed by brief hypotension) tx with vanc/zosyn and then transitioned to IV cefazolin for 6 wk by PICC line  Pt states knee feels a lot better -she can walk on it  Still a bit swollen  No fevers   hosp aquired PNA bw with clindamycin - Friday is last day of that  No cough or sob  Wearing 02 baseline  Thought it was aspiration pneumonia  Therapist saw her  Put her on thickened liquids  eval swallowing   Has HH coming out  Daughter can help as well -nursing   Per hosp d/c -neg blood culture  Stable on d/c   Reviewed d/c summary and care everywhere D/c HB was 11.6- fairly stable (on niferex) BC nl  Chem normal   ? As to whether she needs her K  On hctz  Was not on hosp d/c list   BP Readings from Last 3 Encounters:  09/12/15 118/66  08/27/15 126/82  07/11/15 132/76    Has been assessed by PT- will start soon   Gets tired really easy   Stools are a little loose from abx -not bad and no abd pain   Patient Active Problem List   Diagnosis Date Noted  . Infection of prosthetic left knee joint ([place]) 09/12/2015  . Hospital-acquired pneumonia 09/12/2015  . Insect bite, infected 07/11/2015  . Pulmonary hypertension ([place])   . Chest pain 02/25/2015  . Physical deconditioning 02/25/2015  . RBBB-new 02/12/2015  . History of bilat PE after TKR (off Coumadin Dec 2015) 02/12/2015  . Abnormal EKG-TWI, RBBB, prolonged QT   . Hernia, umbilical [id number]  . Hx of colonic polyps 10/24/2014  . Diverticulosis of colon without hemorrhage   . OSA (obstructive sleep apnea) 07/22/2014  . Unspecified arthropathy, lower leg  03/21/2014  . History of right knee joint replacement 03/15/2014  . Solitary pulmonary nodule 05/03/2013  . Acute cor pulmonale ([place]) 04/26/2013  . Sternal fracture 04/26/2013  . Acute right ventricular heart failure ([place]) 12/09/2012  . PE (pulmonary embolism) 11/24/2012  . DVT (deep venous thrombosis) ([place]) 11/24/2012  . PES PLANUS 08/15/2010  . URINARY FREQUENCY, CHRONIC 07/14/2008  . Dyslipidemia 07/09/2007  . Essential hypertension 07/09/2007  . PSVT with SSS 02/23/2007  . SCLERODERMA 02/23/2007  . DEGENERATIVE JOINT DISEASE 02/23/2007  . SPINAL STENOSIS, LUMBAR 02/23/2007  . OSTEOPENIA 02/23/2007  . SPONDYLOLISTHESIS 02/23/2007   Past Medical History  Diagnosis Date  . Osteopenia   . Hyperlipidemia   . Hypertension   . PSVT (paroxysmal supraventricular tachycardia) ([place])   . Spinal stenosis   . Overactive bladder     urge incontinence  . Pulmonary emboli ([place])   . Pulmonary hypertension ([place])   . Right ventricular failure ([place])   . Arthritis     knee  . Anemia   . Diverticulosis   . Sleep apnea     CPAP  . Hx of colonic polyps 10/24/2014   Past Surgical History  Procedure Laterality Date  . Cataract extraction Bilateral 2008  . Bladder suspension  1993  . Total knee arthroplasty Left  2000  . Total knee revision  01/12/2012    Procedure: TOTAL KNEE REVISION;  Surgeon: Kerin Salen, MD;  Location: [place];  Service: Orthopedics;  Laterality: Left;  DEPUY/ LCS , HAND SET  . Trigger finger release  01/12/2012    Procedure: RELEASE TRIGGER FINGER/A-1 PULLEY;  Surgeon: Kerin Salen, MD;  Location: [place];  Service: Orthopedics;  Laterality: Right;  . Cholecystectomy    . Incisional hernia repair    . Total knee arthroplasty Left 10/29/2012    at [facility]  . Total knee arthroplasty Right 03/09/2014  . Colonoscopy    . Right heart catheterization N/A 07/04/2014    Procedure: RIGHT HEART CATH;  Surgeon: Jolaine Artist, MD;  Location: [facility] CATH LAB;  Service: Cardiovascular;   Laterality: N/A;  . Tonsillectomy and adenoidectomy    . Colonoscopy N/A 10/24/2014    Procedure: COLONOSCOPY;  Surgeon: Gatha Mayer, MD;  Location: [facility];  Service: Endoscopy;  Laterality: N/A;  . Cardiac catheterization N/A 02/28/2015    Procedure: Right/Left Heart Cath and Coronary Angiography;  Surgeon: Troy Sine, MD;  Location: [place] INVASIVE CV LAB CUPID;  Service: Cardiovascular;  Laterality: N/A;  . Electrophysiologic study N/A 03/02/2015    Procedure: SVT Ablation;  Surgeon: Evans Lance, MD;  Location: [place] CV LAB;  Service: Cardiovascular;  Laterality: N/A;   Social History  Substance Use Topics  . Smoking status: Never Smoker   . Smokeless tobacco: Never Used  . Alcohol Use: No   Family History  Problem Relation Age of Onset  . Breast cancer Mother   . Heart disease Father   . Diabetes Father   . Hypertension Father   . Uterine cancer Sister    Allergies  Allergen Reactions  . Zithromax [Azithromycin] Other (See Comments)    Prolonged QT on EKG  . Penicillins Hives  . Sulfonamide Derivatives Hives  . Adhesive [Tape] Other (See Comments)    redness  . Prednisone Other (See Comments)    Causes insomnia   Current Outpatient Prescriptions on File Prior to Visit  Medication Sig Dispense Refill  . acetaminophen (TYLENOL) 325 MG tablet Take 2 tablets (650 mg total) by mouth every 4 (four) hours as needed for headache or mild pain. (Patient taking differently: Take 650 mg by mouth 2 (two) times daily as needed for mild pain or headache. )    . apixaban (ELIQUIS) 5 MG TABS tablet Take 1 tablet (5 mg total) by mouth 2 (two) times daily. (Patient taking differently: Take by mouth 2 (two) times daily. ) 180 tablet 3  . benzonatate (TESSALON) 200 MG capsule TAKE ONE CAPSULE BY MOUTH TWICE DAILY ASNEEDED FOR COUGH 180 capsule 2  . Calcium Carb-Cholecalciferol (CALCIUM 600/VITAMIN D3) 600-800 MG-UNIT TABS Take 1 tablet by mouth 2 (two) times daily.    .  hydrochlorothiazide (HYDRODIURIL) 25 MG tablet Take 1 tablet (25 mg total) by mouth daily. 30 tablet 11  . Multiple Vitamin (MULTIVITAMIN WITH MINERALS) TABS tablet Take 1 tablet by mouth daily.    . naproxen sodium (ANAPROX) 220 MG tablet Take 220 mg by mouth daily as needed.    . polyethylene glycol (MIRALAX / GLYCOLAX) packet Take 8.5 g by mouth daily as needed (CONSTIPATOIN).      No current facility-administered medications on file prior to visit.    Review of Systems Review of Systems  Constitutional: Negative for fever, appetite change,  and unexpected weight change. pos for fatigue and generalized weakness  Eyes: Negative for pain and visual disturbance.  Respiratory: Negative for  shortness of breath.  pos for cough that is very mild/almost gone  Cardiovascular: Negative for cp or palpitations    Gastrointestinal: Negative for nausea, diarrhea and constipation.  Genitourinary: Negative for urgency and frequency.  Skin: Negative for pallor or rash   MSK pos for pain and swelling in L knee that is much improved  Neurological: Negative for weakness, light-headedness, numbness and headaches.  Hematological: Negative for adenopathy. Does not bruise/bleed easily.  Psychiatric/Behavioral: Negative for dysphoric mood. The patient is not nervous/anxious.         Objective:   Physical Exam  Constitutional: She appears well-developed and well-nourished. No distress.  Frail appearing elderly female in wheelchair in good spirits  overwt  HENT:  Head: Normocephalic and atraumatic.  Mouth/Throat: Oropharynx is clear and moist.  Eyes: Conjunctivae and EOM are normal. Pupils are equal, round, and reactive to light. No scleral icterus.  Neck: Normal range of motion. Neck supple. No JVD present. Carotid bruit is not present. No thyromegaly present.  Cardiovascular: Normal rate, regular rhythm, normal heart sounds and intact distal pulses.  Exam reveals no gallop.   Pulmonary/Chest: Effort  normal and breath sounds normal. No respiratory distress. She has no wheezes. She has no rales.  No crackles  Harsh bs at the bases No wheeze Good air exch   Abdominal: Soft. Bowel sounds are normal. She exhibits no distension, no abdominal bruit and no mass. There is no tenderness.  Musculoskeletal: She exhibits edema and tenderness.  L knee is swollen with warmth but no erythema or drainage Mildly tender and limited rom     Lymphadenopathy:    She has no cervical adenopathy.  Neurological: She is alert. She has normal reflexes.  Skin: Skin is warm and dry. No rash noted.  PICC line in place  Psychiatric: She has a normal mood and affect.  Talkative and alert           Assessment & Plan:   Problem List Items Addressed This Visit      Cardiovascular and Mediastinum   Essential hypertension    Controlled well s/p hosp for knee infection  Off lasix for now-so also holding potassium  Will re check this at f/u in 2 wk       Relevant Medications   metoprolol tartrate (LOPRESSOR) 25 MG tablet     Respiratory   Hospital-acquired pneumonia    Suspected aspiration pneumonia in elderly female hospitalized for infection of a knee replacement Much clinical improvement -finishing clindamycin for this  Reassuring exam  Will re check 2 wk with cxr and another eval  She was eval by speech tx in hospital- they did put her on thickened liquids for now (she denies swallowing problems currently) Getting home heatlh currently as well with therapy  Continue to follow Hospital records from [facility] were rev in detail with pt and her daughter       Relevant Medications   diphenhydrAMINE (BENADRYL) 25 mg capsule     Musculoskeletal and Integument   Infection of prosthetic left knee joint ([place]) - Primary    S/p hosp at [facility] On 6 wk of Cefazolin by PICC line and doing well (home health) PT to start soon  Rev hosp records with pt and her daughter

## 2015-09-12 NOTE — Progress Notes (Signed)
Pre visit review using our clinic review tool, if applicable. No additional management support is needed unless otherwise documented below in the visit note. 

## 2015-09-16 NOTE — Assessment & Plan Note (Signed)
S/p hosp at [facility] On 6 wk of Cefazolin by PICC line and doing well (home health) PT to start soon  Rev hosp records with pt and her daughter

## 2015-09-16 NOTE — Assessment & Plan Note (Signed)
Suspected aspiration pneumonia in elderly female hospitalized for infection of a knee replacement Much clinical improvement -finishing clindamycin for this  Reassuring exam  Will re check 2 wk with cxr and another eval  She was eval by speech tx in hospital- they did put her on thickened liquids for now (she denies swallowing problems currently) Getting home heatlh currently as well with therapy  Continue to follow Hospital records from [facility] were rev in detail with pt and her daughter

## 2015-09-16 NOTE — Assessment & Plan Note (Signed)
Controlled well s/p hosp for knee infection  Off lasix for now-so also holding potassium  Will re check this at f/u in 2 wk

## 2015-09-18 DIAGNOSIS — T8450XA Infection and inflammatory reaction due to unspecified internal joint prosthesis, initial encounter: Secondary | ICD-10-CM | POA: Insufficient documentation

## 2015-09-26 ENCOUNTER — Ambulatory Visit (INDEPENDENT_AMBULATORY_CARE_PROVIDER_SITE_OTHER): Payer: Medicare Other | Admitting: Family Medicine

## 2015-09-26 ENCOUNTER — Encounter: Payer: Self-pay | Admitting: Family Medicine

## 2015-09-26 ENCOUNTER — Ambulatory Visit (INDEPENDENT_AMBULATORY_CARE_PROVIDER_SITE_OTHER)
Admission: RE | Admit: 2015-09-26 | Discharge: 2015-09-26 | Disposition: A | Discharge status: Home / Self Care | Payer: Medicare Other | Source: Ambulatory Visit | Attending: Family Medicine | Admitting: Family Medicine

## 2015-09-26 VITALS — BP 122/68 | HR 56 | Temp 98.1°F | Ht 66.0 in | Wt 198.5 lb

## 2015-09-26 DIAGNOSIS — J189 Pneumonia, unspecified organism: Secondary | ICD-10-CM

## 2015-09-26 DIAGNOSIS — I10 Essential (primary) hypertension: Secondary | ICD-10-CM | POA: Diagnosis not present

## 2015-09-26 DIAGNOSIS — T8454XA Infection and inflammatory reaction due to internal left knee prosthesis, initial encounter: Secondary | ICD-10-CM

## 2015-09-26 DIAGNOSIS — Y95 Nosocomial condition: Principal | ICD-10-CM

## 2015-09-26 LAB — BASIC METABOLIC PANEL
BUN: 21 mg/dL (ref 6–23)
CHLORIDE: 95 meq/L — AB (ref 96–112)
CO2: 34 mEq/L — ABNORMAL HIGH (ref 19–32)
Calcium: 9.7 mg/dL (ref 8.4–10.5)
Creatinine, Ser: 0.83 mg/dL (ref 0.40–1.20)
GFR: 70.32 mL/min (ref >60.00)
Glucose, Bld: 89 mg/dL (ref 70–99)
POTASSIUM: 4.3 meq/L (ref 3.5–5.1)
Sodium: 135 mEq/L (ref 135–145)

## 2015-09-26 MED ORDER — PANTOPRAZOLE SODIUM 40 MG PO TBEC: 40.0000 mg | DELAYED_RELEASE_TABLET | Freq: Every day | ORAL | Status: DC

## 2015-09-26 NOTE — Patient Instructions (Signed)
Lab today  Chest xray today  Take care of yourself   Advance activity as tolerated Keep doing your physical therapy

## 2015-09-26 NOTE — Progress Notes (Signed)
Subjective:    Patient ID: [name], female    DOB: 1934-08-22, 81 y.o.   MRN: [id number]  HPI Here for f/u of hosp aq pneumonia and other problems   Gradually feeling better  No cough  No more sob than usual-using her 02  Is a little down emotionally  Impatient getting over this   Antibiotics -still getting by PICC line - and leg infection is getting better  Knee is looking and getting better   Wt is down 8 lb  Is eating well  Appetite is fairl for the most part   bp is stable today  No cp or palpitations or headaches or edema  No side effects to medicines  BP Readings from Last 3 Encounters:  09/26/15 122/68  09/12/15 118/66  08/27/15 126/82    Not a lot of swelling in her ankles   Patient Active Problem List   Diagnosis Date Noted  . Infection of prosthetic left knee joint ([place]) 09/12/2015  . Hospital-acquired pneumonia 09/12/2015  . Insect bite, infected 07/11/2015  . Pulmonary hypertension ([place])   . Chest pain 02/25/2015  . Physical deconditioning 02/25/2015  . RBBB-new 02/12/2015  . History of bilat PE after TKR (off Coumadin Dec 2015) 02/12/2015  . Abnormal EKG-TWI, RBBB, prolonged QT   . Hernia, umbilical [id number]  . Hx of colonic polyps 10/24/2014  . Diverticulosis of colon without hemorrhage   . OSA (obstructive sleep apnea) 07/22/2014  . Unspecified arthropathy, lower leg 03/21/2014  . History of right knee joint replacement 03/15/2014  . Solitary pulmonary nodule 05/03/2013  . Acute cor pulmonale ([place]) 04/26/2013  . Sternal fracture 04/26/2013  . Acute right ventricular heart failure ([place]) 12/09/2012  . PE (pulmonary embolism) 11/24/2012  . DVT (deep venous thrombosis) ([place]) 11/24/2012  . PES PLANUS 08/15/2010  . URINARY FREQUENCY, CHRONIC 07/14/2008  . Dyslipidemia 07/09/2007  . Essential hypertension 07/09/2007  . PSVT with SSS 02/23/2007  . SCLERODERMA 02/23/2007  . DEGENERATIVE JOINT DISEASE 02/23/2007  . SPINAL STENOSIS, LUMBAR  02/23/2007  . OSTEOPENIA 02/23/2007  . SPONDYLOLISTHESIS 02/23/2007   Past Medical History  Diagnosis Date  . Osteopenia   . Hyperlipidemia   . Hypertension   . PSVT (paroxysmal supraventricular tachycardia) ([place])   . Spinal stenosis   . Overactive bladder     urge incontinence  . Pulmonary emboli ([place])   . Pulmonary hypertension ([place])   . Right ventricular failure ([place])   . Arthritis     knee  . Anemia   . Diverticulosis   . Sleep apnea     CPAP  . Hx of colonic polyps 10/24/2014   Past Surgical History  Procedure Laterality Date  . Cataract extraction Bilateral 2008  . Bladder suspension  1993  . Total knee arthroplasty Left 2000  . Total knee revision  01/12/2012    Procedure: TOTAL KNEE REVISION;  Surgeon: Kerin Salen, MD;  Location: [facility];  Service: Orthopedics;  Laterality: Left;  DEPUY/ LCS , HAND SET  . Trigger finger release  01/12/2012    Procedure: RELEASE TRIGGER FINGER/A-1 PULLEY;  Surgeon: Kerin Salen, MD;  Location: [facility];  Service: Orthopedics;  Laterality: Right;  . Cholecystectomy    . Incisional hernia repair    . Total knee arthroplasty Left 10/29/2012    at [facility]  . Total knee arthroplasty Right 03/09/2014  . Colonoscopy    . Right heart catheterization N/A 07/04/2014    Procedure: RIGHT HEART CATH;  Surgeon: Quillian Quince  R Bensimhon, MD;  Location: [facility] CATH LAB;  Service: Cardiovascular;  Laterality: N/A;  . Tonsillectomy and adenoidectomy    . Colonoscopy N/A 10/24/2014    Procedure: COLONOSCOPY;  Surgeon: Gatha Mayer, MD;  Location: [facility];  Service: Endoscopy;  Laterality: N/A;  . Cardiac catheterization N/A 02/28/2015    Procedure: Right/Left Heart Cath and Coronary Angiography;  Surgeon: Troy Sine, MD;  Location: [facility] INVASIVE CV LAB CUPID;  Service: Cardiovascular;  Laterality: N/A;  . Electrophysiologic study N/A 03/02/2015    Procedure: SVT Ablation;  Surgeon: Evans Lance, MD;  Location: [facility] CV LAB;  Service: Cardiovascular;   Laterality: N/A;   Social History  Substance Use Topics  . Smoking status: Never Smoker   . Smokeless tobacco: Never Used  . Alcohol Use: No   Family History  Problem Relation Age of Onset  . Breast cancer Mother   . Heart disease Father   . Diabetes Father   . Hypertension Father   . Uterine cancer Sister    Allergies  Allergen Reactions  . Zithromax [Azithromycin] Other (See Comments)    Prolonged QT on EKG  . Penicillins Hives  . Sulfonamide Derivatives Hives  . Adhesive [Tape] Other (See Comments)    redness  . Prednisone Other (See Comments)    Causes insomnia   Current Outpatient Prescriptions on File Prior to Visit  Medication Sig Dispense Refill  . acetaminophen (TYLENOL) 325 MG tablet Take 2 tablets (650 mg total) by mouth every 4 (four) hours as needed for headache or mild pain. (Patient taking differently: Take 650 mg by mouth 2 (two) times daily as needed for mild pain or headache. )    . apixaban (ELIQUIS) 5 MG TABS tablet Take 1 tablet (5 mg total) by mouth 2 (two) times daily. (Patient taking differently: Take by mouth 2 (two) times daily. ) 180 tablet 3  . benzonatate (TESSALON) 200 MG capsule TAKE ONE CAPSULE BY MOUTH TWICE DAILY ASNEEDED FOR COUGH 180 capsule 2  . Calcium Carb-Cholecalciferol (CALCIUM 600/VITAMIN D3) 600-800 MG-UNIT TABS Take 1 tablet by mouth 2 (two) times daily.    . Cholecalciferol (VITAMIN D-1000 MAX ST) 1000 UNITS tablet Take 1,000 Units by mouth daily.    . diphenhydrAMINE (BENADRYL) 25 mg capsule Take 25 mg by mouth every morning.    . hydrochlorothiazide (HYDRODIURIL) 25 MG tablet Take 1 tablet (25 mg total) by mouth daily. 30 tablet 11  . HYDROcodone-acetaminophen (NORCO/VICODIN) 5-325 MG tablet Take 1 tablet by mouth every 8 (eight) hours as needed.    . iron polysaccharides (NIFEREX) 150 MG capsule Take 150 mg by mouth daily.    . metoprolol tartrate (LOPRESSOR) 25 MG tablet Take 25 mg by mouth 2 (two) times daily.    . Multiple  Vitamin (MULTIVITAMIN WITH MINERALS) TABS tablet Take 1 tablet by mouth daily.    . naproxen sodium (ANAPROX) 220 MG tablet Take 220 mg by mouth daily as needed.    . pantoprazole (PROTONIX) 40 MG tablet Take 40 mg by mouth daily.    . polyethylene glycol (MIRALAX / GLYCOLAX) packet Take 8.5 g by mouth daily as needed (CONSTIPATOIN).     Marland Kitchen traMADol (ULTRAM) 50 MG tablet Take 50-100 mg by mouth every 6 (six) hours as needed.     No current facility-administered medications on file prior to visit.     Review of Systems Review of Systems  Constitutional: Negative for fever, appetite change,  and unexpected weight change. pos for  fatigue that is improving  Eyes: Negative for pain and visual disturbance.  Respiratory: Negative for cough and shortness of breath.  (wears 02) Cardiovascular: Negative for cp or palpitations    Gastrointestinal: Negative for nausea, diarrhea and constipation.  Genitourinary: Negative for urgency and frequency.  Skin: Negative for pallor or rash   MSK pos for swelling in L knee from infx that is improving  Neurological: Negative for focal weakness, light-headedness, numbness and headaches.  Hematological: Negative for adenopathy. Does not bruise/bleed easily.  Psychiatric/Behavioral: pos  for dysphoric mood. The patient is not nervous/anxious.         Objective:   Physical Exam  Constitutional: She appears well-developed and well-nourished. No distress.  obese and well appearing in wheelchair   HENT:  Head: Normocephalic and atraumatic.  Mouth/Throat: Oropharynx is clear and moist.  Eyes: Conjunctivae and EOM are normal. Pupils are equal, round, and reactive to light.  Neck: Normal range of motion. Neck supple. No JVD present. Carotid bruit is not present. No thyromegaly present.  Cardiovascular: Normal rate, regular rhythm, normal heart sounds and intact distal pulses.  Exam reveals no gallop.   Pulmonary/Chest: Effort normal and breath sounds normal. No  respiratory distress. She has no wheezes. She has no rales. She exhibits no tenderness.  No crackles  Abdominal: Soft. Bowel sounds are normal. She exhibits no distension, no abdominal bruit and no mass. There is no tenderness.  Musculoskeletal: She exhibits edema.  L knee- improving edema Warm but not red  No skin breakdown  Improved rom   Lymphadenopathy:    She has no cervical adenopathy.  Neurological: She is alert. She has normal reflexes. No cranial nerve deficit. She exhibits normal muscle tone. Coordination normal.  Skin: Skin is warm and dry. No rash noted. No erythema. No pallor.  Psychiatric: She has a normal mood and affect.  Pt expresses frustration over wanting to get better faster           Assessment & Plan:   Problem List Items Addressed This Visit      Cardiovascular and Mediastinum   Essential hypertension    bp is currently stable Reassuring  No change in tx  Lab today      Relevant Orders   Basic metabolic panel (Completed)     Respiratory   Hospital-acquired pneumonia - Primary    Clinically resolved with reassuring exam  cxr today Will update if cough or other symptoms Continue current 02      Relevant Medications   Ciprofloxacin (CIPRO IV)   Other Relevant Orders   DG Chest 2 View (Completed)     Musculoskeletal and Integument   Infection of prosthetic left knee joint ([place])    Still rec abx through PICC line and doing well Continued clinical imp  F/u with ID soon

## 2015-09-26 NOTE — Progress Notes (Signed)
Pre visit review using our clinic review tool, if applicable. No additional management support is needed unless otherwise documented below in the visit note. 

## 2015-09-27 ENCOUNTER — Encounter: Payer: Self-pay | Admitting: *Deleted

## 2015-09-30 NOTE — Assessment & Plan Note (Signed)
Clinically resolved with reassuring exam  cxr today Will update if cough or other symptoms Continue current 02

## 2015-09-30 NOTE — Assessment & Plan Note (Signed)
Still rec abx through PICC line and doing well Continued clinical imp  F/u with ID soon

## 2015-09-30 NOTE — Assessment & Plan Note (Signed)
bp is currently stable Reassuring  No change in tx  Lab today

## 2015-10-02 ENCOUNTER — Telehealth: Payer: Self-pay | Admitting: *Deleted

## 2015-10-02 ENCOUNTER — Encounter: Payer: Self-pay | Admitting: Physician Assistant

## 2015-10-02 ENCOUNTER — Ambulatory Visit (INDEPENDENT_AMBULATORY_CARE_PROVIDER_SITE_OTHER): Payer: Medicare Other | Admitting: Physician Assistant

## 2015-10-02 VITALS — BP 120/58 | HR 59 | Ht 66.0 in | Wt 198.0 lb

## 2015-10-02 DIAGNOSIS — I10 Essential (primary) hypertension: Secondary | ICD-10-CM

## 2015-10-02 DIAGNOSIS — I272 Other secondary pulmonary hypertension: Secondary | ICD-10-CM | POA: Diagnosis not present

## 2015-10-02 DIAGNOSIS — I471 Supraventricular tachycardia: Secondary | ICD-10-CM | POA: Diagnosis not present

## 2015-10-02 DIAGNOSIS — I480 Paroxysmal atrial fibrillation: Secondary | ICD-10-CM | POA: Diagnosis not present

## 2015-10-02 DIAGNOSIS — I8290 Acute embolism and thrombosis of unspecified vein: Secondary | ICD-10-CM | POA: Insufficient documentation

## 2015-10-02 NOTE — Assessment & Plan Note (Signed)
Blood pressure controlled. 

## 2015-10-02 NOTE — Assessment & Plan Note (Signed)
Patient had rapid atrial fibrillation when in the hospital with infected knee prosthetic and aspiration pneumonia. She converted to normal sinus rhythm and is maintaining sinus rhythm on metoprolol. We'll continue low dose. Patient is already on eliquis 5 mg twice a day. Follow-up with Dr. Rayann Heman in 2-3 months.

## 2015-10-02 NOTE — Telephone Encounter (Signed)
Called pt twice and both times her phone was busy, will try to call back later

## 2015-10-02 NOTE — Assessment & Plan Note (Signed)
Status post ablation in May.

## 2015-10-02 NOTE — Telephone Encounter (Signed)
I looked at last lab from [facility] and Hb looked ok  She can stop it if she wants to  Please remove from med list

## 2015-10-02 NOTE — Progress Notes (Signed)
Cardiology Office Note   Date:  10/02/2015   ID:  [name], DOB [birth year], MRN [id number]  PCP:  Loura Pardon, MD  Cardiologist:  Dr. Rayann Heman Chief Complaint: [name]    History of Present Illness: [name] is a 81 y.o. female who presents for follow-up. She has a history of PSVT status post ablation 02/2015 metoprolol stopped 04/2015, hypertension, pulmonary hypertension, status post total knee replacement complicated by DVT and PE remains on Eliquis. Pulmonary hypertension was mild on our HC with PA P mean 20 32M PVR only 1.6 to be a U suspect overestimated on echo. Source of pulmonary hypertension is likely severe obstructive sleep apnea plus minus OHS. She did not have significant disease on CT of chest or VQ scan. No significant coronary obstructive disease with mild proximal RCA at cath, low normal global LV function EF 50-55% mild to moderate pulmonary hypertension on left and right heart cath 02/28/15.  She recently developed hospital-acquired pneumonia and is receiving anabiotic through PICC line. She was in [facility] for staph areus of the prosthetic left knee and they think she had aspiration pneumonia. She also had atrial fib in this setting that converted with diltiazem but  initially said which to metoprolol. Her notes her in care everywhere.  Patient comes in today accompanied by her daughter. She is in a wheelchair on oxygen. She denies any chest pain, palpitations, dizziness or presyncope. She has chronic dyspnea if she is not on her oxygen. She says she doesn't remember her heart racing when she was in the hospital but she was pretty sick.  Past Medical History  Diagnosis Date  . Osteopenia   . Hyperlipidemia   . Hypertension   . PSVT (paroxysmal supraventricular tachycardia) ([place])   . Spinal stenosis   . Overactive bladder     urge incontinence  . Pulmonary emboli ([place])   . Pulmonary hypertension ([place])   . Right ventricular failure ([place])   . Arthritis     knee  .  Anemia   . Diverticulosis   . Sleep apnea     CPAP  . Hx of colonic polyps 10/24/2014    Past Surgical History  Procedure Laterality Date  . Cataract extraction Bilateral 2008  . Bladder suspension  1993  . Total knee arthroplasty Left 2000  . Total knee revision  01/12/2012    Procedure: TOTAL KNEE REVISION;  Surgeon: Kerin Salen, MD;  Location: [facility];  Service: Orthopedics;  Laterality: Left;  DEPUY/ LCS , HAND SET  . Trigger finger release  01/12/2012    Procedure: RELEASE TRIGGER FINGER/A-1 PULLEY;  Surgeon: Kerin Salen, MD;  Location: [facility];  Service: Orthopedics;  Laterality: Right;  . Cholecystectomy    . Incisional hernia repair    . Total knee arthroplasty Left 10/29/2012    at [facility]  . Total knee arthroplasty Right 03/09/2014  . Colonoscopy    . Right heart catheterization N/A 07/04/2014    Procedure: RIGHT HEART CATH;  Surgeon: Jolaine Artist, MD;  Location: [facility] CATH LAB;  Service: Cardiovascular;  Laterality: N/A;  . Tonsillectomy and adenoidectomy    . Colonoscopy N/A 10/24/2014    Procedure: COLONOSCOPY;  Surgeon: Gatha Mayer, MD;  Location: [facility];  Service: Endoscopy;  Laterality: N/A;  . Cardiac catheterization N/A 02/28/2015    Procedure: Right/Left Heart Cath and Coronary Angiography;  Surgeon: Troy Sine, MD;  Location: [facility] INVASIVE CV LAB CUPID;  Service: Cardiovascular;  Laterality: N/A;  .  Electrophysiologic study N/A 03/02/2015    Procedure: SVT Ablation;  Surgeon: Evans Lance, MD;  Location: [place] CV LAB;  Service: Cardiovascular;  Laterality: N/A;     Current Outpatient Prescriptions  Medication Sig Dispense Refill  . acetaminophen (TYLENOL) 325 MG tablet Take 2 tablets (650 mg total) by mouth every 4 (four) hours as needed for headache or mild pain.    Marland Kitchen apixaban (ELIQUIS) 5 MG TABS tablet Take 1 tablet (5 mg total) by mouth 2 (two) times daily. 180 tablet 3  . benzonatate (TESSALON) 200 MG capsule TAKE ONE CAPSULE BY MOUTH TWICE DAILY  ASNEEDED FOR COUGH 180 capsule 2  . Calcium Carb-Cholecalciferol (CALCIUM 600/VITAMIN D3) 600-800 MG-UNIT TABS Take 1 tablet by mouth 2 (two) times daily.    . Cholecalciferol (VITAMIN D-1000 MAX ST) 1000 UNITS tablet Take 1,000 Units by mouth daily.    . Ciprofloxacin (CIPRO IV) Inject into the vein every 8 (eight) hours.    . diphenhydrAMINE (BENADRYL) 25 mg capsule Take 25 mg by mouth every morning.    . hydrochlorothiazide (HYDRODIURIL) 25 MG tablet Take 1 tablet (25 mg total) by mouth daily. 30 tablet 11  . HYDROcodone-acetaminophen (NORCO/VICODIN) 5-325 MG tablet Take 1 tablet by mouth every 8 (eight) hours as needed.    . iron polysaccharides (NIFEREX) 150 MG capsule Take 150 mg by mouth daily.    . metoprolol tartrate (LOPRESSOR) 25 MG tablet Take 25 mg by mouth 2 (two) times daily.    . Multiple Vitamin (MULTIVITAMIN WITH MINERALS) TABS tablet Take 1 tablet by mouth daily.    . naproxen sodium (ANAPROX) 220 MG tablet Take 220 mg by mouth daily as needed.    . pantoprazole (PROTONIX) 40 MG tablet Take 1 tablet (40 mg total) by mouth daily. 30 tablet 11  . polyethylene glycol (MIRALAX / GLYCOLAX) packet Take 8.5 g by mouth daily as needed (CONSTIPATOIN).     Marland Kitchen traMADol (ULTRAM) 50 MG tablet Take 50-100 mg by mouth every 6 (six) hours as needed.     No current facility-administered medications for this visit.    Allergies:   Zithromax; Penicillins; Sulfonamide derivatives; Adhesive; and Prednisone    Social History:  The patient  reports that she has never smoked. She has never used smokeless tobacco. She reports that she does not drink alcohol or use illicit drugs.   Family History:  The patient's    family history includes Breast cancer in her mother; Diabetes in her father; Heart disease in her father; Hypertension in her father; Uterine cancer in her sister.    ROS:  Please see the history of present illness.   Otherwise, review of systems are positive for depression, balance and  walking problems since her left knee surgery.   All other systems are reviewed and negative.    PHYSICAL EXAM: VS:  BP 120/58 mmHg  Pulse 59  Ht 5\' 6"  (1.676 m)  Wt 198 lb (89.812 kg)  BMI 31.97 kg/m2 , BMI Body mass index is 31.97 kg/(m^2). GEN: Well nourished, well developed, in no acute distress Neck: no JVD, HJR, carotid bruits, or masses Cardiac:  RRR; 1/6 systolic murmur at the left sternal border, no gallop, rubs, thrill or heave,  Respiratory: Decreased breath sounds but clear to auscultation bilaterally, normal work of breathing GI: soft, nontender, nondistended, + BS MS: no deformity or atrophy Extremities: without cyanosis, clubbing, edema, good distal pulses bilaterally.  Skin: warm and dry, no rash Neuro:  Strength and sensation are  intact    EKG:  EKG is ordered today. The ekg ordered today demonstrates normal sinus rhythm at 60 bpm with PVC  Recent Labs: 02/10/2015: ALT 19; Magnesium 1.7 02/19/2015: Pro B Natriuretic peptide (BNP) 1306.0* 03/12/2015: Hemoglobin 14.3; Platelets 311 09/26/2015: BUN 21; Creatinine, Ser 0.83; Potassium 4.3; Sodium 135    Lipid Panel    Component Value Date/Time   CHOL 104 03/14/2014 0519   TRIG 95 03/14/2014 0519   HDL 32* 03/14/2014 0519   CHOLHDL 3.3 03/14/2014 0519   VLDL 19 03/14/2014 0519   LDLCALC 53 03/14/2014 0519      Wt Readings from Last 3 Encounters:  10/02/15 198 lb (89.812 kg)  09/26/15 198 lb 8 oz (90.039 kg)  08/27/15 206 lb (93.441 kg)      Other studies Reviewed: Additional studies/ records that were reviewed today include and review of the records demonstrates:  Lexi scan 02/12/15 IMPRESSION: 1.  Normal Lexiscan Myoview study.   2.  The left ventricle systolic function is normal   3. Left ventricular ejection fraction 57% Right/Left Heart Cath and Coronary Angiography     Conclusion      No significant coronary obstructive disease with mild proximal RCA ectasia.     Low normal global LV  function with an ejection fraction of 50-55% without focal segmental wall motion abnormalities.  Mild to moderate pulmonary hypertension  Recommendations: Medical therapy.  Anticoagulation will be reinstituted in this patient with history of bilateral pulmonary thromboembolic disease.     2-D echo 02/12/15 Study Conclusions  - Left ventricle: Paradoxical septal motiom. The cavity size was   normal. Wall thickness was increased in a pattern of mild LVH.   The estimated ejection fraction was 55%. - Mitral valve: There was mild regurgitation. - Right ventricle: The cavity size was severely dilated. Systolic   function was moderately to severely reduced. - Tricuspid valve: There was moderate regurgitation. - Pulmonary arteries: PA peak pressure: 57 mm Hg (S). - Pericardium, extracardiac: A small pericardial effusion was   identified posterior to the heart.  RHC/LHC: 02/28/2015: Right/Left Heart Cath and Coronary Angiography     Conclusion      No significant coronary obstructive disease with mild proximal RCA ectasia.     Low normal global LV function with an ejection fraction of 50-55% without focal segmental wall motion abnormalities.  Mild to moderate pulmonary hypertension  Recommendations: Medical therapy.  Anticoagulation will be reinstituted in this patient with history of bilateral pulmonary thromboembolic disease.        ASSESSMENT AND PLAN: Paroxysmal atrial fibrillation [facility]) Patient had rapid atrial fibrillation when in the hospital with infected knee prosthetic and aspiration pneumonia. She converted to normal sinus rhythm and is maintaining sinus rhythm on metoprolol. We'll continue low dose. Patient is already on eliquis 5 mg twice a day. Follow-up with Dr. Rayann Heman in 2-3 months.  Pulmonary hypertension C results of cardiac catheterization and echo in April May of this year. Currently stable.  Essential hypertension Blood pressure controlled  PSVT with  SSS Status post ablation in May.     Sumner Boast, PA-C  10/02/2015 11:47 AM    [facility] [place], [place],   [address] Phone: [phone]; Fax: [phone]

## 2015-10-02 NOTE — Patient Instructions (Signed)
Medication Instructions:  Your physician recommends that you continue on your current medications as directed. Please refer to the Current Medication list given to you today.   Labwork: None ordered   Testing/Procedures: None ordered   Follow-Up: Your physician recommends that you schedule a follow-up appointment in: 2 months with Dr Allred   Any Other Special Instructions Will Be Listed Below (If Applicable).     If you need a refill on your cardiac medications before your next appointment, please call your pharmacy.   

## 2015-10-02 NOTE — Telephone Encounter (Signed)
Received fax from [facility] saying that pt is requesting that we fill her Iron, pt has Rx done while at the hospital and if you want her to keep taking Rx they need a new script from [place] sent to them, please advise

## 2015-10-02 NOTE — Assessment & Plan Note (Signed)
C results of cardiac catheterization and echo in April May of this year. Currently stable.

## 2015-10-03 NOTE — Telephone Encounter (Signed)
Pharmacy advise not to refill med and left voicemail requesting pt to call office back

## 2015-10-03 NOTE — Telephone Encounter (Signed)
Santiago Glad returned your call for pt.  Best number to call [phone]

## 2015-10-03 NOTE — Telephone Encounter (Signed)
Pt notified of Dr. Marliss Coots comments and advise pt to stop iron and pt verbalized understanding

## 2015-10-05 ENCOUNTER — Ambulatory Visit (INDEPENDENT_AMBULATORY_CARE_PROVIDER_SITE_OTHER): Payer: Medicare Other | Admitting: Adult Health

## 2015-10-05 ENCOUNTER — Encounter: Payer: Self-pay | Admitting: Adult Health

## 2015-10-05 VITALS — BP 120/68 | HR 68 | Temp 98.0°F | Ht 66.0 in | Wt 200.0 lb

## 2015-10-05 DIAGNOSIS — J9610 Chronic respiratory failure, unspecified whether with hypoxia or hypercapnia: Secondary | ICD-10-CM | POA: Insufficient documentation

## 2015-10-05 DIAGNOSIS — J189 Pneumonia, unspecified organism: Secondary | ICD-10-CM

## 2015-10-05 DIAGNOSIS — G4733 Obstructive sleep apnea (adult) (pediatric): Secondary | ICD-10-CM | POA: Diagnosis not present

## 2015-10-05 DIAGNOSIS — J9611 Chronic respiratory failure with hypoxia: Secondary | ICD-10-CM | POA: Diagnosis not present

## 2015-10-05 DIAGNOSIS — Y95 Nosocomial condition: Secondary | ICD-10-CM

## 2015-10-05 NOTE — Assessment & Plan Note (Signed)
Appears resolved on CXR  Clinically improved

## 2015-10-05 NOTE — Assessment & Plan Note (Signed)
Cont on O2 2l/m  

## 2015-10-05 NOTE — Patient Instructions (Addendum)
Restart on CPAP with oxygen At bedtime  Goal is to wear at least 4-6hr each night.  Download in 30 days .  Continue on oxygen 2l/m .  Follow up Dr. Elsworth Soho  In 3 months and As needed

## 2015-10-05 NOTE — Progress Notes (Signed)
   Subjective:    Patient ID: [name], female    DOB: February 11, 1934, 81 y.o.   MRN: [id number]  HPI 81 year old woman with pulmonary hypertension, OSA and hypoxic RF on O2 (w/ CPAP) , PE , Cor Pulmonale and PSVT /A-Fib s/p Ablation 02/2015   She had a complicated course after left knee replacement in 81 due to a DVT/PE. due to a DVT/PE. Tx w/ anticoagulation >1 year, Off Xarelto Dec 2015.     10/05/2015 Post hospital follow up : Cor Pulmonale, Pulmonary HTN, , OSA on CPAP and O2.  Pt was recently admitted at [facility] for infected prosthetic knee and HCAP . She is on IV ABX via PICC. She says she is being followed at [place] [facility] at [facility] , is on IV abx until 12/14 then begin oral abx. Knee is doing better.  Had follow up cxr on 09/26/15 with marked improvement w/ no residual pna.   She has hx of Atrial Fib and PSVT w/ recent ablation in May this year . Followed by Dr. Rayann Heman. She remains on Eliquis for this.     She has mild pulmonary HTN on RHC with PAP at 25 . Felt secondary to severe OSA/OHS .  She is suppose to be on CPAP . Previously compliant .says she has not been wearing lately with everything she has had going on . She says she will restart wearing it.     Review of Systems  Constitutional:   No  weight loss, night sweats,  Fevers, chills,  +fatigue, or  lassitude.  HEENT:   No headaches,  Difficulty swallowing,  Tooth/dental problems, or  Sore throat,                No sneezing, itching, ear ache, nasal congestion, post nasal drip,   CV:  No chest pain,  Orthopnea, PND,  , anasarca, dizziness, palpitations, syncope.   GI  No heartburn, indigestion, abdominal pain, nausea, vomiting, diarrhea, change in bowel habits, loss of appetite, bloody stools.   Resp .  No excess mucus, no productive cough,  No non-productive cough,  No coughing up of blood.  No change in color of mucus.  No wheezing.  No chest wall deformity  Skin: no rash or lesions.  GU: no dysuria, change in color of  urine, no urgency or frequency.  No flank pain, no hematuria   MS:  +knee pain/ swelling.  Psych:  No change in mood or affect. No depression or anxiety.  No memory loss.         Objective:   Physical Exam  GEN: A/Ox3; pleasant , NAD, elderly  VS reviewed   HEENT:  [place]/AT,  EACs-clear, TMs-wnl, NOSE-clear, THROAT-clear, no lesions, no postnasal drip or exudate noted.   NECK:  Supple w/ fair ROM; no JVD; normal carotid impulses w/o bruits; no thyromegaly or nodules palpated; no lymphadenopathy.  RESP  Decreased BS in bases w/o wheezes/ rales/ or rhonchi.no accessory muscle use, no dullness to percussion  CARD:  RRR, no m/r/g  , tr  peripheral edema, pulses intact, no cyanosis or clubbing.  GI:   Soft & nt; nml bowel sounds; no organomegaly or masses detected.  Musco: Warm bil, no deformities or joint swelling noted.   Neuro: alert, no focal deficits noted.    Skin: Warm, no lesions or rashes         Assessment & Plan:

## 2015-10-05 NOTE — Assessment & Plan Note (Signed)
Severe OSA -need to restart CPAP   Plan  Restart on CPAP with oxygen At bedtime  Goal is to wear at least 4-6hr each night.  Download in 30 days .  Continue on oxygen 2l/m .  Follow up Dr. Elsworth Soho  In 3 months and As needed

## 2015-10-26 ENCOUNTER — Other Ambulatory Visit: Payer: Self-pay

## 2015-10-26 MED ORDER — HYDROCHLOROTHIAZIDE 25 MG PO TABS: 25.0000 mg | ORAL_TABLET | Freq: Every day | ORAL | Status: DC

## 2015-10-26 NOTE — Telephone Encounter (Signed)
[facility] Advised done. See 09/10/15 phone note; pt said running low on med and will not cancel [facility] refills so if needs med prior to mail order pt can get from [place]. Pt voiced understanding.

## 2015-10-30 NOTE — Progress Notes (Signed)
Reviewed & agree with plan  

## 2015-11-12 ENCOUNTER — Other Ambulatory Visit: Payer: Self-pay | Admitting: *Deleted

## 2015-11-12 ENCOUNTER — Telehealth: Payer: Self-pay

## 2015-11-12 MED ORDER — METOPROLOL TARTRATE 25 MG PO TABS: 25.0000 mg | ORAL_TABLET | Freq: Two times a day (BID) | ORAL | Status: DC

## 2015-11-12 NOTE — Telephone Encounter (Signed)
A womans voice left v/m(no name) requesting refill metoprolol to [facility]. Unable to reach pt or [name]; Spoke with Santiago Glad (DPR signed) and she will try to reach Elizabeth and have her call cardiologist; appears 05/07/15 office note that metoprolol was stopped by Dr Rayann Heman. FYI to Dr [name].

## 2015-11-18 IMAGING — DX DG CHEST 2V
2 series · 2 of 2 positions shown · non-contrast
Comparison: 02/25/2015.

CLINICAL DATA: Acute shortness of breath. Cough for 2 weeks. Left
shoulder pain for 1 week.

EXAM:
CHEST  2 VIEW

[chest pa]
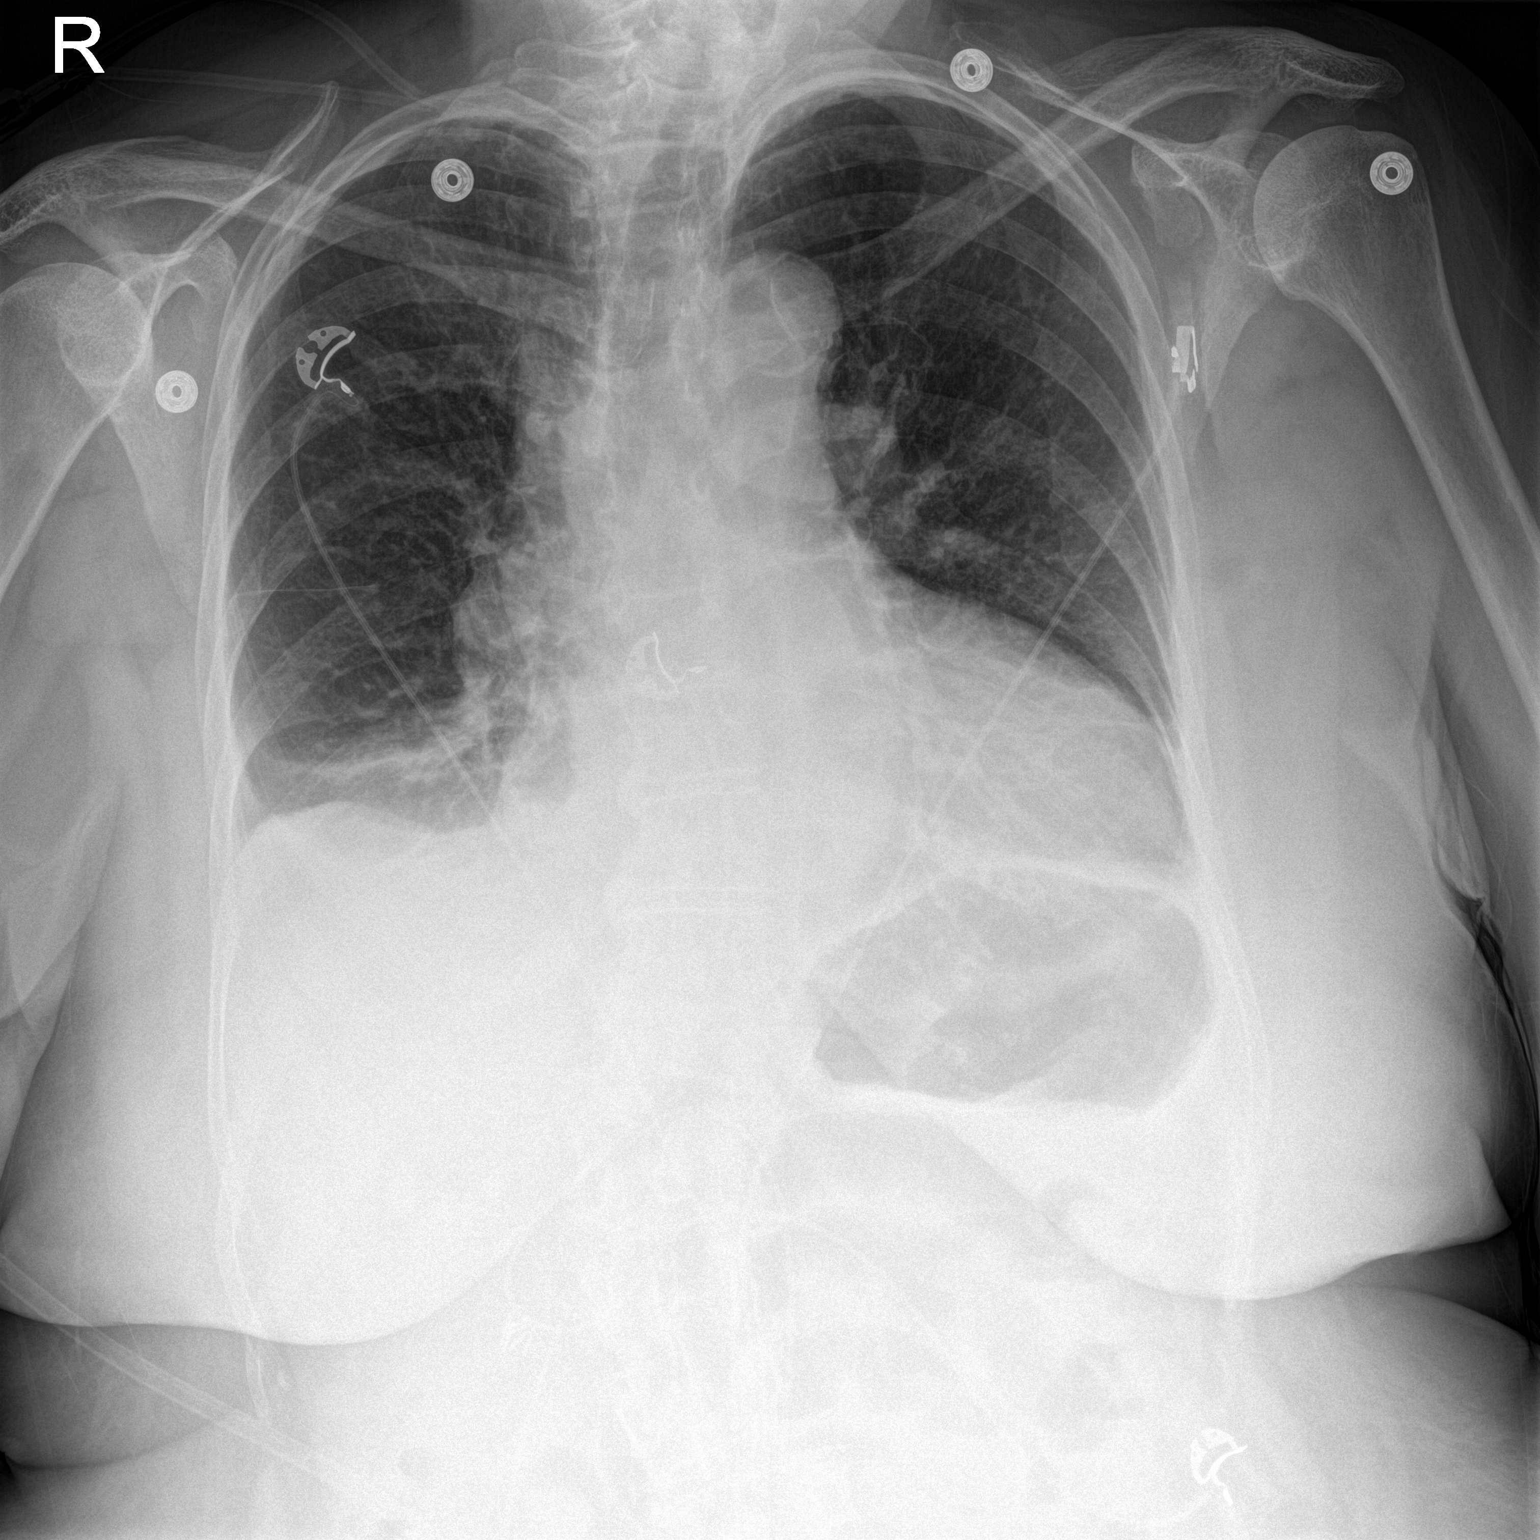

[chest lat]
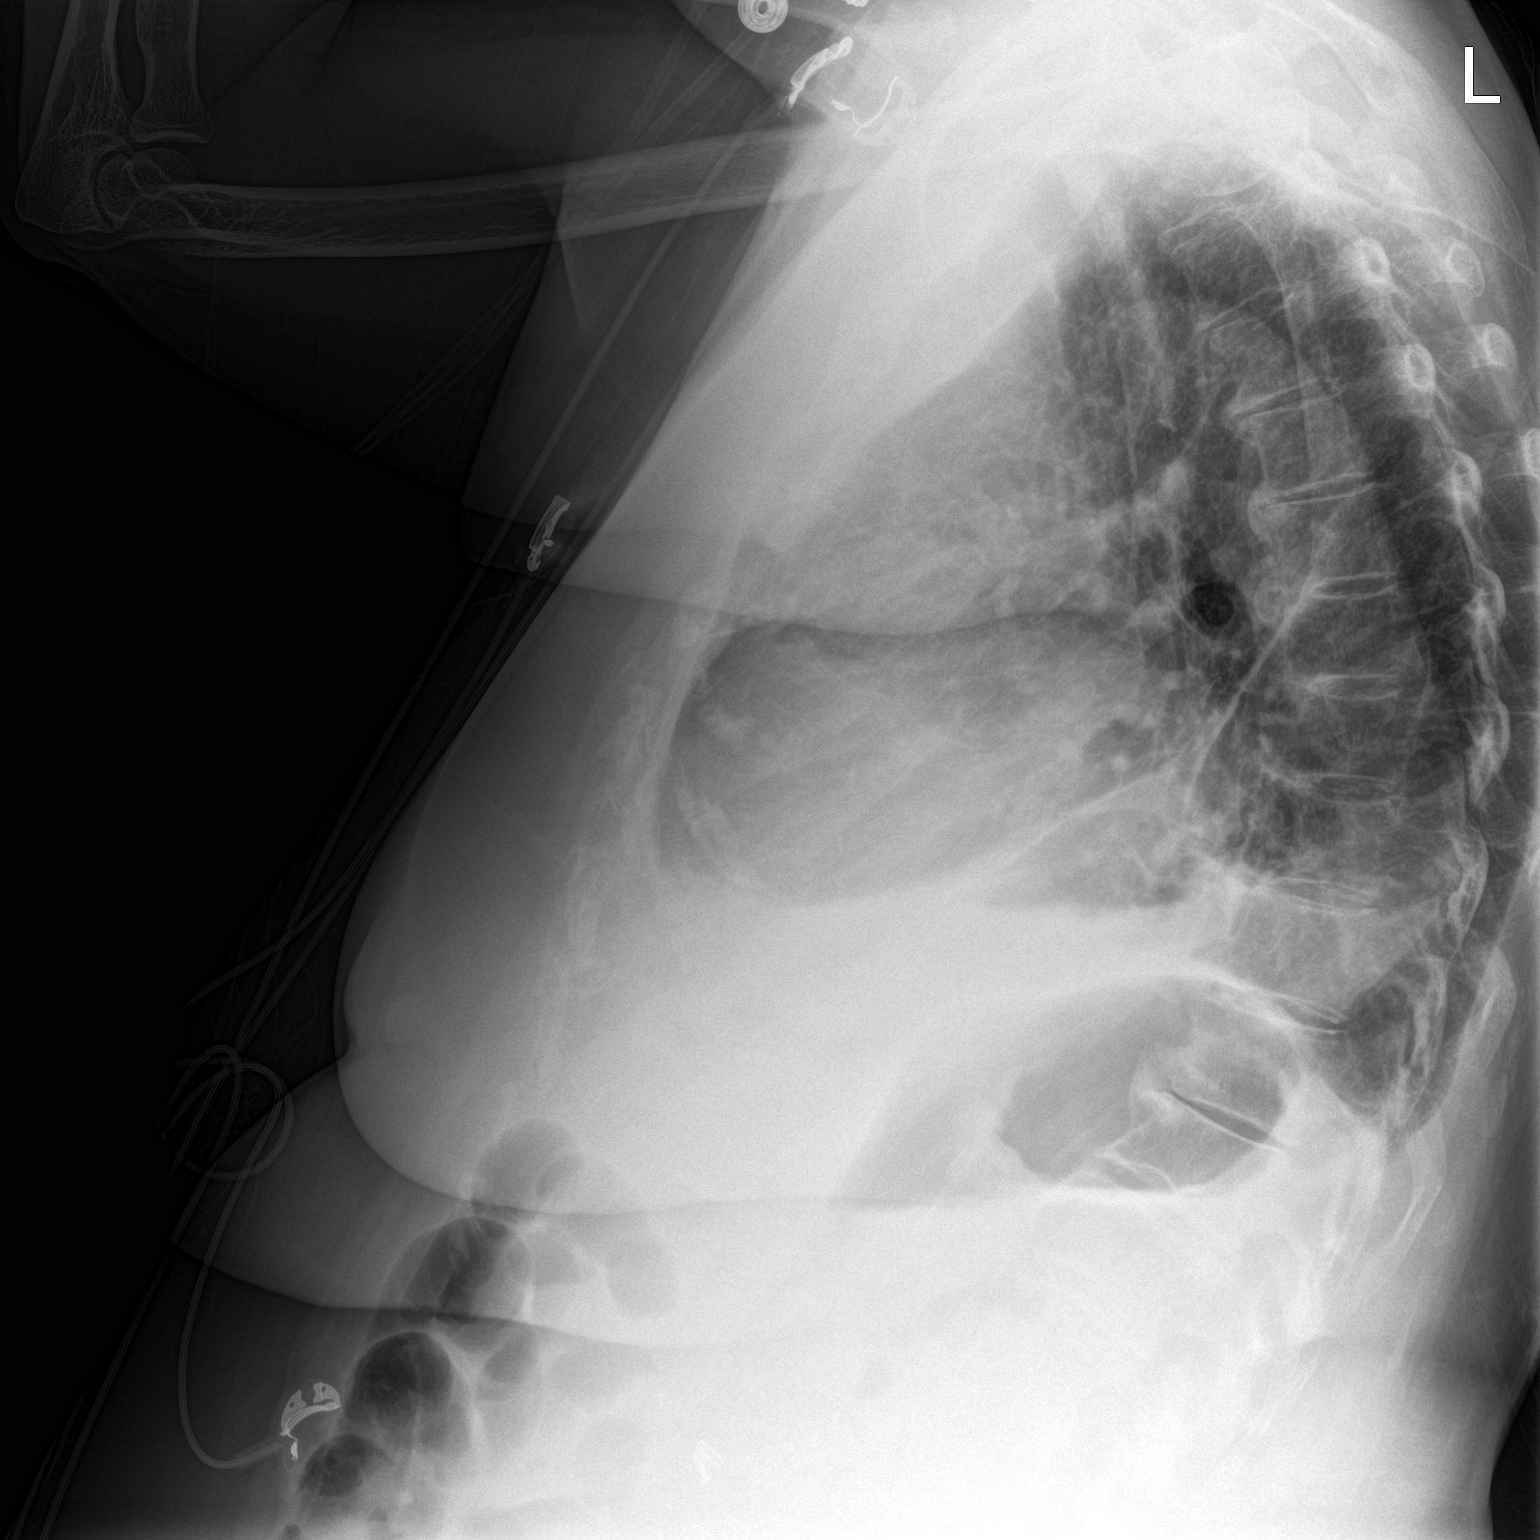

[2 of 2 positions shown; findings below may reference images not displayed]

FINDINGS: Mild to moderate enlargement of the cardiopericardial silhouette,
stable. Aorta is tortuous no mediastinal or hilar masses or evidence
of adenopathy.

Bibasilar lung opacity, improved on the right from the prior study.
Small right pleural effusion decreased the prior study. Persistent
basilar atelectasis. No pulmonary edema. No pneumothorax.

Bony thorax is demineralized but grossly intact.
IMPRESSION: 1. Lung base opacity, right greater than left.
2. Right lung base opacity appears improved from most recent prior
study consistent with a combination of a decrease in right pleural
fluid with decreased atelectasis. Milder, stable left basilar
opacity is consistent with atelectasis. No convincing pneumonia. No
pulmonary edema.
3. Stable cardiomegaly.

## 2015-11-19 ENCOUNTER — Ambulatory Visit (INDEPENDENT_AMBULATORY_CARE_PROVIDER_SITE_OTHER): Payer: Medicare Other | Admitting: Family Medicine

## 2015-11-19 ENCOUNTER — Encounter: Payer: Self-pay | Admitting: Family Medicine

## 2015-11-19 VITALS — BP 128/82 | HR 62 | Temp 97.9°F | Ht 66.0 in | Wt 200.0 lb

## 2015-11-19 DIAGNOSIS — R05 Cough: Secondary | ICD-10-CM | POA: Diagnosis not present

## 2015-11-19 DIAGNOSIS — T8454XA Infection and inflammatory reaction due to internal left knee prosthesis, initial encounter: Secondary | ICD-10-CM | POA: Diagnosis not present

## 2015-11-19 DIAGNOSIS — R053 Chronic cough: Secondary | ICD-10-CM | POA: Insufficient documentation

## 2015-11-19 DIAGNOSIS — K429 Umbilical hernia without obstruction or gangrene: Secondary | ICD-10-CM | POA: Diagnosis not present

## 2015-11-19 MED ORDER — HYDROCODONE-HOMATROPINE 5-1.5 MG/5ML PO SYRP: 5.0000 mL | ORAL_SOLUTION | Freq: Three times a day (TID) | ORAL | Status: DC | PRN

## 2015-11-19 NOTE — Progress Notes (Signed)
Pre visit review using our clinic review tool, if applicable. No additional management support is needed unless otherwise documented below in the visit note. 

## 2015-11-19 NOTE — Assessment & Plan Note (Signed)
Pt remains on chronic abx -keflex  Has several scabs on anterior knee-almost healed/warned not to scratch Enc use of abx ointment  Small red area on back of the knee-nt -unsure what this is  Enc her to f/u with her surgeon for a re check

## 2015-11-19 NOTE — Assessment & Plan Note (Signed)
Re checked this  Is just to the L of midline umbilicus Reducible and nt  Will continue to watch  If this becomes bigger/painful or un reducible -inst to call/update [place] or seek care  Would not be a good surgical candidate currently -still on abx for a knee infection

## 2015-11-19 NOTE — Patient Instructions (Signed)
I think you have a post illness cough syndrome  Take the tessalon 1 pill three times daily and use it around the clock to try to suppress the cough  In addition- take hycodan at night and during the day if you are not doing anything (it may make you dizzy or unsteady) Hopefully this combination will stop the cough cycle  If you develop fever/ more productive cough or feel sick- please let me know  If you develop nasal allergy symptoms or heartburn also let me know   If the hernia causes pain or gets worse -let me know See your surgeon for recheck of knee when you can- I think the wounds are looking better- don't scratch, and use an over the counter antibiotic ointment

## 2015-11-19 NOTE — Assessment & Plan Note (Signed)
In medically complex pt with pulmonary HTN (formerly 02 dependent), and with pneumonia in the fall that resolved  Reassuring exam today  No nasal or GI symptoms  Suspect this may be a post viral/illness cough syndrome  Urged her to get back on tessalon and take it tid around the clock Also px for hycodan for use with caution of sedation (primarily at night)   Update if no imp in 2 wk or if worse or s/s of infection

## 2015-11-19 NOTE — Progress Notes (Signed)
Subjective:    Patient ID: [name], female    DOB: [birth year], 82 y.o.   MRN: [id number]  HPI Here with a cough   Same one that started with the pneumonia she had  Just not going away completely  Prod- little to none - clear for the most part  02 sat is 97% on RA today No sob  No longer needs oxygen   She has not set up her cpap yet   No nasal symptoms  No throat pain   No acid problems  Is on protonix   No wheezing   Thinks her hernia is bigger in abdomen -not painful however   Has some redness behind the knee that  was infected   Patient Active Problem List   Diagnosis Date Noted  . Chronic respiratory failure ([place]) 10/05/2015  . Thromboembolism of vein 10/02/2015  . Infection of prosthetic joint ([place]) 09/18/2015  . Infection of prosthetic left knee joint ([place]) 09/12/2015  . Hospital-acquired pneumonia 09/12/2015  . Aspiration pneumonia ([place]) 09/08/2015  . Effusion of knee 08/30/2015  . Insect bite, infected 07/11/2015  . Pulmonary hypertension ([place])   . Chest pain 02/25/2015  . Physical deconditioning 02/25/2015  . RBBB-new 02/12/2015  . History of bilat PE after TKR (off Coumadin Dec 2015) 02/12/2015  . Abnormal EKG-TWI, RBBB, prolonged QT   . Hernia, umbilical [id number]  . Hx of colonic polyps 10/24/2014  . Diverticulosis of colon without hemorrhage   . OSA (obstructive sleep apnea) 07/22/2014  . Unspecified arthropathy, lower leg 03/21/2014  . History of right knee joint replacement 03/15/2014  . Arthritis of knee 03/09/2014  . Solitary pulmonary nodule 05/03/2013  . Acute cor pulmonale ([place]) 04/26/2013  . Sternal fracture 04/26/2013  . Acute right ventricular heart failure ([place]) 12/09/2012  . PE (pulmonary embolism) 11/24/2012  . DVT (deep venous thrombosis) ([place]) 11/24/2012  . Paroxysmal atrial fibrillation ([place]) 11/01/2012  . History of artificial joint 10/29/2012  . 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 BP (high blood  pressure) 10/14/2012  . Complications due to internal joint prosthesis ([place]) 09/07/2012  . PES PLANUS 08/15/2010  . URINARY FREQUENCY, CHRONIC 07/14/2008  . Dyslipidemia 07/09/2007  . Essential hypertension 07/09/2007  . PSVT with SSS 02/23/2007  . SCLERODERMA 02/23/2007  . DEGENERATIVE JOINT DISEASE 02/23/2007  . SPINAL STENOSIS, LUMBAR 02/23/2007  . OSTEOPENIA 02/23/2007  . SPONDYLOLISTHESIS 02/23/2007   Past Medical History  Diagnosis Date  . Osteopenia   . Hyperlipidemia   . Hypertension   . PSVT (paroxysmal supraventricular tachycardia) ([place])   . Spinal stenosis   . Overactive bladder     urge incontinence  . Pulmonary emboli ([place])   . Pulmonary hypertension ([place])   . Right ventricular failure ([place])   . Arthritis     knee  . Anemia   . Diverticulosis   . Sleep apnea     CPAP  . Hx of colonic polyps 10/24/2014   Past Surgical History  Procedure Laterality Date  . Cataract extraction Bilateral 2008  . Bladder suspension  1993  . Total knee arthroplasty Left 2000  . Total knee revision  01/12/2012    Procedure: TOTAL KNEE REVISION;  Surgeon: Kerin Salen, MD;  Location: [facility];  Service: Orthopedics;  Laterality: Left;  DEPUY/ LCS , HAND SET  . Trigger finger release  01/12/2012    Procedure: RELEASE TRIGGER FINGER/A-1 PULLEY;  Surgeon: Kerin Salen, MD;  Location: [facility];  Service: Orthopedics;  Laterality: Right;  . Cholecystectomy    . Incisional hernia repair    . Total knee arthroplasty Left 10/29/2012    at [facility]  . Total knee arthroplasty Right 03/09/2014  . Colonoscopy    . Right heart catheterization N/A 07/04/2014    Procedure: RIGHT HEART CATH;  Surgeon: Jolaine Artist, MD;  [facility] CATH LAB;  Service: Cardiovascular;  Laterality: N/A;  . Tonsillectomy and adenoidectomy    . Colonoscopy N/A 10/24/2014    Procedure: COLONOSCOPY;  Surgeon: Gatha Mayer, MD;  Location: [facility] ENDOSCOPY;  Service: Endoscopy;  Laterality: N/A;  . Cardiac catheterization N/A  02/28/2015    Procedure: Right/Left Heart Cath and Coronary Angiography;  Surgeon: Troy Sine, MD;  Location: [place] INVASIVE CV LAB CUPID;  Service: Cardiovascular;  Laterality: N/A;  . Electrophysiologic study N/A 03/02/2015    Procedure: SVT Ablation;  Surgeon: Evans Lance, MD;  Location: [place] CV LAB;  Service: Cardiovascular;  Laterality: N/A;   Social History  Substance Use Topics  . Smoking status: Never Smoker   . Smokeless tobacco: Never Used  . Alcohol Use: No   Family History  Problem Relation Age of Onset  . Breast cancer Mother   . Heart disease Father   . Diabetes Father   . Hypertension Father   . Uterine cancer Sister    Allergies  Allergen Reactions  . Zithromax [Azithromycin] Other (See Comments)    Prolonged QT on EKG  . Penicillins Hives  . Sulfonamide Derivatives Hives  . Adhesive [Tape] Other (See Comments) and Rash    redness  . Prednisone Other (See Comments)    Causes insomnia   Current Outpatient Prescriptions on File Prior to Visit  Medication Sig Dispense Refill  . acetaminophen (TYLENOL) 325 MG tablet Take 2 tablets (650 mg total) by mouth every 4 (four) hours as needed for headache or mild pain.    Marland Kitchen apixaban (ELIQUIS) 5 MG TABS tablet Take 1 tablet (5 mg total) by mouth 2 (two) times daily. 180 tablet 3  . benzonatate (TESSALON) 200 MG capsule TAKE ONE CAPSULE BY MOUTH TWICE DAILY ASNEEDED FOR COUGH 180 capsule 2  . Calcium Carb-Cholecalciferol (CALCIUM 600/VITAMIN D3) 600-800 MG-UNIT TABS Take 1 tablet by mouth 2 (two) times daily.    . Cholecalciferol (VITAMIN D-1000 MAX ST) 1000 UNITS tablet Take 1,000 Units by mouth daily.    . Ciprofloxacin (CIPRO IV) Inject into the vein every 8 (eight) hours.    . diphenhydrAMINE (BENADRYL) 25 mg capsule Take 25 mg by mouth every morning.    . hydrochlorothiazide (HYDRODIURIL) 25 MG tablet Take 1 tablet (25 mg total) by mouth daily. 90 tablet 3  . metoprolol tartrate (LOPRESSOR) 25 MG tablet Take 1  tablet (25 mg total) by mouth 2 (two) times daily. 60 tablet 10  . Multiple Vitamin (MULTIVITAMIN WITH MINERALS) TABS tablet Take 1 tablet by mouth daily.    . naproxen sodium (ANAPROX) 220 MG tablet Take 220 mg by mouth daily as needed.    . pantoprazole (PROTONIX) 40 MG tablet Take 1 tablet (40 mg total) by mouth daily. 30 tablet 11  . polyethylene glycol (MIRALAX / GLYCOLAX) packet Take 8.5 g by mouth daily as needed (CONSTIPATOIN).      No current facility-administered medications on file prior to visit.     Review of Systems Review of Systems  Constitutional: Negative for fever, appetite change, fatigue and unexpected weight change.  ENT neg for cong or rhinorrhea or pnd  Eyes: Negative for pain and visual disturbance.  Respiratory: Negative for wheeze  and shortness of breath.  (no longer on 02) Cardiovascular: Negative for cp or palpitations    Gastrointestinal: Negative for nausea, diarrhea and constipation. pos for abdominal hernia that does not hurt  MSK pos for L knee pain (infection) which continues to improve with a few scabs left  Genitourinary: Negative for urgency and frequency.  Skin: Negative for pallor or rash  pos for itching of scabs on L knee Neurological: Negative for weakness, light-headedness, numbness and headaches.  Hematological: Negative for adenopathy. Does not bruise/bleed easily.  Psychiatric/Behavioral: Negative for dysphoric mood. The patient is not nervous/anxious.         Objective:   Physical Exam  Constitutional: She appears well-developed and well-nourished. No distress.  obese and well appearing elderly female  HENT:  Head: Normocephalic and atraumatic.  Right Ear: External ear normal.  Left Ear: External ear normal.  Nose: Nose normal.  Mouth/Throat: Oropharynx is clear and moist. No oropharyngeal exudate.  Eyes: Conjunctivae and EOM are normal. Pupils are equal, round, and reactive to light.  Neck: Normal range of motion. Neck supple. No  JVD present. Carotid bruit is not present. No thyromegaly present.  Cardiovascular: Normal rate, regular rhythm, normal heart sounds and intact distal pulses.  Exam reveals no gallop.   Pulmonary/Chest: Effort normal and breath sounds normal. No respiratory distress. She has no wheezes. She has no rales. She exhibits no tenderness.  No crackles  Abdominal: Soft. Bowel sounds are normal. She exhibits no distension, no abdominal bruit and no mass. There is no tenderness. There is no rebound and no guarding.  Umbilical hernia -L side of umbilicus is soft/reducible and nontender   Nl bs  Musculoskeletal: She exhibits no edema.  Lymphadenopathy:    She has no cervical adenopathy.  Neurological: She is alert. She has normal reflexes.  Skin: Skin is warm and dry. No rash noted.  Several scabs on anterior L knee that are healing and not infected looking  One dime size area of redness on back of knee is non tender   Psychiatric: She has a normal mood and affect.          Assessment & Plan:   Problem List Items Addressed This Visit      Musculoskeletal and Integument   Infection of prosthetic left knee joint (HCC)    Pt remains on chronic abx -keflex  Has several scabs on anterior knee-almost healed/warned not to scratch Enc use of abx ointment  Small red area on back of the knee-nt -unsure what this is  Enc her to f/u with her surgeon for a re check      Relevant Medications   cephALEXin (KEFLEX) 500 MG capsule     Other   Chronic cough - Primary    In medically complex pt with pulmonary HTN (formerly 02 dependent), and with pneumonia in the fall that resolved  Reassuring exam today  No nasal or GI symptoms  Suspect this may be a post viral/illness cough syndrome  Urged her to get back on tessalon and take it tid around the clock Also px for hycodan for use with caution of sedation (primarily at night)   Update if no imp in 2 wk or if worse or s/s of infection        Umbilical hernia    Re checked this  Is just to the L of midline umbilicus Reducible and nt  Will continue to  watch  If this becomes bigger/painful or un reducible -inst to call/update [place] or seek care  Would not be a good surgical candidate currently -still on abx for a knee infection

## 2015-12-03 ENCOUNTER — Ambulatory Visit (INDEPENDENT_AMBULATORY_CARE_PROVIDER_SITE_OTHER): Payer: Medicare Other | Admitting: Internal Medicine

## 2015-12-03 ENCOUNTER — Encounter: Payer: Self-pay | Admitting: Internal Medicine

## 2015-12-03 VITALS — BP 120/68 | HR 56 | Ht 66.0 in | Wt 203.4 lb

## 2015-12-03 DIAGNOSIS — I480 Paroxysmal atrial fibrillation: Secondary | ICD-10-CM | POA: Diagnosis not present

## 2015-12-03 DIAGNOSIS — I2699 Other pulmonary embolism without acute cor pulmonale: Secondary | ICD-10-CM

## 2015-12-03 MED ORDER — APIXABAN 2.5 MG PO TABS: 2.5000 mg | ORAL_TABLET | Freq: Two times a day (BID) | ORAL | Status: DC

## 2015-12-03 MED ORDER — METOPROLOL TARTRATE 25 MG PO TABS: 12.5000 mg | ORAL_TABLET | Freq: Two times a day (BID) | ORAL | Status: DC

## 2015-12-03 NOTE — Progress Notes (Signed)
PCP:  Loura Pardon, MD  The patient presents today for cardiology followup.  On eliquis for prior PTE.  SVT has resolved s/p ablation.  She has not had atrial fibrillation clinically observed to my knowledge (though this is suggested in the pulmonary notes--> incorrectly I believe).  She has done well since without any further symptoms of arrhythmia. She denies SOB (above baseline) today.  Denies CP or any new concerns.   Past Medical History  Diagnosis Date  . Osteopenia   . Hyperlipidemia   . Hypertension   . PSVT (paroxysmal supraventricular tachycardia) ([place])   . Spinal stenosis   . Overactive bladder     urge incontinence  . Pulmonary emboli ([place])   . Pulmonary hypertension ([place])   . Right ventricular failure ([place])   . Arthritis     knee  . Anemia   . Diverticulosis   . Sleep apnea     CPAP  . Hx of colonic polyps 10/24/2014   Past Surgical History  Procedure Laterality Date  . Cataract extraction Bilateral 2008  . Bladder suspension  1993  . Total knee arthroplasty Left 2000  . Total knee revision  01/12/2012    Procedure: TOTAL KNEE REVISION;  Surgeon: Kerin Salen, MD;  Location: [facility];  Service: Orthopedics;  Laterality: Left;  DEPUY/ LCS , HAND SET  . Trigger finger release  01/12/2012    Procedure: RELEASE TRIGGER FINGER/A-1 PULLEY;  Surgeon: Kerin Salen, MD;  Location: [place];  Service: Orthopedics;  Laterality: Right;  . Cholecystectomy    . Incisional hernia repair    . Total knee arthroplasty Left 10/29/2012    at [facility]  . Total knee arthroplasty Right 03/09/2014  . Colonoscopy    . Right heart catheterization N/A 07/04/2014    Procedure: RIGHT HEART CATH;  Surgeon: Jolaine Artist, MD;  Location: [facility] CATH LAB;  Service: Cardiovascular;  Laterality: N/A;  . Tonsillectomy and adenoidectomy    . Colonoscopy N/A 10/24/2014    Procedure: COLONOSCOPY;  Surgeon: Gatha Mayer, MD;  [facility];  Service: Endoscopy;  Laterality: N/A;  . Cardiac  catheterization N/A 02/28/2015    Procedure: Right/Left Heart Cath and Coronary Angiography;  Surgeon: Troy Sine, MD;  Location: [place] INVASIVE CV LAB CUPID;  Service: Cardiovascular;  Laterality: N/A;  . Electrophysiologic study N/A 03/02/2015    Procedure: SVT Ablation;  Surgeon: Evans Lance, MD;  Location: [place] CV LAB;  Service: Cardiovascular;  Laterality: N/A;    Current Outpatient Prescriptions  Medication Sig Dispense Refill  . acetaminophen (TYLENOL) 325 MG tablet Take 2 tablets (650 mg total) by mouth every 4 (four) hours as needed for headache or mild pain.    Marland Kitchen apixaban (ELIQUIS) 5 MG TABS tablet Take 1 tablet (5 mg total) by mouth 2 (two) times daily. 180 tablet 3  . benzonatate (TESSALON) 200 MG capsule TAKE ONE CAPSULE BY MOUTH TWICE DAILY ASNEEDED FOR COUGH 180 capsule 2  . Calcium Carb-Cholecalciferol (CALCIUM 600/VITAMIN D3) 600-800 MG-UNIT TABS Take 1 tablet by mouth 2 (two) times daily.    . cephALEXin (KEFLEX) 500 MG capsule Take 500 mg by mouth 3 (three) times daily.    . Cholecalciferol (VITAMIN D-1000 MAX ST) 1000 UNITS tablet Take 1,000 Units by mouth daily.    . diphenhydrAMINE (BENADRYL) 25 mg capsule Take 25 mg by mouth every morning.    . hydrochlorothiazide (HYDRODIURIL) 25 MG tablet Take 1 tablet (25 mg total) by mouth daily. [place]  tablet 3  . HYDROcodone-homatropine (HYCODAN) 5-1.5 MG/5ML syrup Take 5 mLs by mouth every 8 (eight) hours as needed for cough (when not up and around or driving or working - caution of sedation). 120 mL 0  . metoprolol tartrate (LOPRESSOR) 25 MG tablet Take 1 tablet (25 mg total) by mouth 2 (two) times daily. 60 tablet 10  . Multiple Vitamin (MULTIVITAMIN WITH MINERALS) TABS tablet Take 1 tablet by mouth daily.    . naproxen sodium (ANAPROX) 220 MG tablet Take 220 mg by mouth daily as needed (pain).     . pantoprazole (PROTONIX) 40 MG tablet Take 1 tablet (40 mg total) by mouth daily. 30 tablet 11  . polyethylene glycol (MIRALAX /  GLYCOLAX) packet Take 8.5 g by mouth daily as needed (CONSTIPATOIN).      No current facility-administered medications for this visit.    Allergies  Allergen Reactions  . Zithromax [Azithromycin] Other (See Comments)    Prolonged QT on EKG  . Penicillins Hives  . Sulfonamide Derivatives Hives  . Adhesive [Tape] Other (See Comments) and Rash    redness  . Prednisone Other (See Comments)    Causes insomnia    Social History   Social History  . Marital Status: Widowed    Spouse Name: N/A  . Number of Children: 3  . Years of Education: N/A   Occupational History  . Retired   .     Social History Main Topics  . Smoking status: Never Smoker   . Smokeless tobacco: Never Used  . Alcohol Use: No  . Drug Use: No  . Sexual Activity: No   Other Topics Concern  . Not on file   Social History Narrative   Pt lives alone but dtr lives on property and has been staying with her since surgery.    Family History  Problem Relation Age of Onset  . Breast cancer Mother   . Heart disease Father   . Diabetes Father   . Hypertension Father   . Uterine cancer Sister     Physical Exam: Filed Vitals:   12/03/15 1123  BP: 120/68  Pulse: 56  Height: 5\' 6"  (1.676 m)  Weight: 203 lb 6.4 oz (92.262 kg)    GEN- The patient is elderly appearing, alert and oriented x 3 today.   Head- normocephalic, atraumatic Eyes-  Sclera clear, conjunctiva pink Ears- hearing intact Oropharynx- clear Neck- supple,   Lungs- Clear to ausculation bilaterally, normal work of breathing Heart- Regular rate and rhythm, no murmurs, rubs or gallops, PMI not laterally displaced GI- soft, NT, ND, + BS Extremities- no edema. Neuro- strength and sensation are intact  ekg today reveals sinus rhythm with PACs  Assessment and Plan:  1.H/O PTE/pulmonary hypertension Doing well with eliquis. As per manufacturer dosing for chronic PTE prevention and clinical trial design, I will reduce dose to 2.5mg  BID  today.  2. SVT Resolved s/p ablation She is reluctant to stop metoprolol.  I will therefore reduce to 12.5mg  BID today.  She could stop this at any time. Pulmonary notes suggest Afib, however, I have only seen SVT.  I do NOT see that she has had afib previously.  3. Overweight Weight loss advised  As her arrhythmia issues are resolved, I will see as needed going forward.  Thompson Grayer MD, [facility] 12/03/2015 11:33 AM

## 2015-12-03 NOTE — Patient Instructions (Signed)
Medication Instructions:  Your physician has recommended you make the following change in your medication:  1) Decrease Metoprolol to 12.5 mg twice daily 2) Decrease Eliquis to 2.5 mg twice daily   Labwork: None ordered   Testing/Procedures: None ordered   Follow-Up: Your physician recommends that you schedule a follow-up appointment as needed with Dr Rayann Heman    Any Other Special Instructions Will Be Listed Below (If Applicable).     If you need a refill on your cardiac medications before your next appointment, please call your pharmacy.

## 2015-12-04 ENCOUNTER — Telehealth: Payer: Self-pay

## 2015-12-04 NOTE — Telephone Encounter (Signed)
Prior auth obtained for Eliquis 2.5mg  from [facility]. Case ID [id number].

## 2015-12-06 ENCOUNTER — Telehealth: Payer: Self-pay

## 2015-12-06 NOTE — Telephone Encounter (Signed)
Pt notified of Dr. Marliss Coots comments. Pt said there was no particular medication she was worried about interacting with a grapefruit she just said a very long time ago she was told not to eat them by a doctor, but she could have been on a different med back then, pt will try to eat one an if any problems she will let us know

## 2015-12-06 NOTE — Telephone Encounter (Signed)
Patient returned Shapale's call.  Please call patient back at [phone].

## 2015-12-06 NOTE — Telephone Encounter (Signed)
Left voicemail requesting pt to call office back 

## 2015-12-06 NOTE — Telephone Encounter (Signed)
I think it should be ok - is there a particular medicine she is taking (perhaps not on her medicine list) that she read may have and interaction?  There are some cardiac drugs that do.  If not , ok .

## 2015-12-06 NOTE — Telephone Encounter (Signed)
Pt left v/m wanting to know if pt can have grapefruit. Pt request cb.

## 2015-12-18 ENCOUNTER — Telehealth: Payer: Self-pay | Admitting: Pulmonary Disease

## 2015-12-18 NOTE — Telephone Encounter (Signed)
ATC pt, line rings busy x 3 WCB 

## 2015-12-19 NOTE — Telephone Encounter (Signed)
Pt returning call today about needing info sent to [facility] so that ins will cover cpap she can be reached @ [phone].Hillery Hunter

## 2015-12-19 NOTE — Telephone Encounter (Signed)
Pr 10/05/15 OV: Patient Instructions       Restart on CPAP with oxygen At bedtime   Goal is to wear at least 4-6hr each night.   Download in 30 days .   Continue on oxygen 2l/m .   Follow up Dr. Elsworth Soho  In 3 months and As needed   --  Called spoke with pt. She reports she needs her last OV note sent to [facility] and [facility] has not been able to get this from [place]. Advised will call [place] and speak with melissa to pull her OV note Called Melissa and she will give OV note to her team. Nothing further needed

## 2015-12-19 NOTE — Telephone Encounter (Signed)
Attempted to call. No answer, no option to leave a message. Will try back. 

## 2015-12-20 ENCOUNTER — Telehealth: Payer: Self-pay | Admitting: Pulmonary Disease

## 2015-12-20 NOTE — Telephone Encounter (Signed)
Called spoke with [name]. Made her aware this was taken care of yesterday. She needed nothing further

## 2015-12-20 NOTE — Telephone Encounter (Signed)
[phone] [name] calling back

## 2015-12-20 NOTE — Telephone Encounter (Signed)
ATC [name] at number provided and received message this number is no longer in service or has been d/c'd. Called home # for pt and she reports she will advise [name] to call. See phone note from 12/19/15. Lenna Sciara has already pulled note for [facility]

## 2015-12-24 ENCOUNTER — Encounter: Payer: Self-pay | Admitting: Primary Care

## 2015-12-24 ENCOUNTER — Ambulatory Visit (INDEPENDENT_AMBULATORY_CARE_PROVIDER_SITE_OTHER): Payer: Medicare Other | Admitting: Primary Care

## 2015-12-24 VITALS — BP 122/78 | HR 68 | Temp 98.8°F | Ht 66.0 in | Wt 195.1 lb

## 2015-12-24 DIAGNOSIS — J069 Acute upper respiratory infection, unspecified: Secondary | ICD-10-CM

## 2015-12-24 NOTE — Progress Notes (Signed)
Pre visit review using our clinic review tool, if applicable. No additional management support is needed unless otherwise documented below in the visit note. 

## 2015-12-24 NOTE — Patient Instructions (Signed)
Your symptoms are likely related to a viral infection that cannot be treated with antibiotics.   Cough/Congestion: Try taking Mucinex DM. This will help loosen up the mucous in your chest. Ensure you take this medication with a full glass of water.  You may also try taking Robitussin or Delsym if your cough becomes bothersome.  Please notify me if you develop persistent fevers of 101, start coughing up green mucous, notice increased fatigue or weakness, or feel worse after 1 week of onset of symptoms.   Increase consumption of water intake and rest.  It was a pleasure meeting you!  Upper Respiratory Infection, Adult Most upper respiratory infections (URIs) are a viral infection of the air passages leading to the lungs. A URI affects the nose, throat, and upper air passages. The most common type of URI is nasopharyngitis and is typically referred to as "the common cold." URIs run their course and usually go away on their own. Most of the time, a URI does not require medical attention, but sometimes a bacterial infection in the upper airways can follow a viral infection. This is called a secondary infection. Sinus and middle ear infections are common types of secondary upper respiratory infections. Bacterial pneumonia can also complicate a URI. A URI can worsen asthma and chronic obstructive pulmonary disease (COPD). Sometimes, these complications can require emergency medical care and may be life threatening.  CAUSES Almost all URIs are caused by viruses. A virus is a type of germ and can spread from one person to another.  RISKS FACTORS You may be at risk for a URI if:   You smoke.   You have chronic heart or lung disease.  You have a weakened defense (immune) system.   You are very young or very old.   You have nasal allergies or asthma.  You work in crowded or poorly ventilated areas.  You work in health care facilities or schools. SIGNS AND SYMPTOMS  Symptoms typically  develop 2-3 days after you come in contact with a cold virus. Most viral URIs last 7-10 days. However, viral URIs from the influenza virus (flu virus) can last 14-18 days and are typically more severe. Symptoms may include:   Runny or stuffy (congested) nose.   Sneezing.   Cough.   Sore throat.   Headache.   Fatigue.   Fever.   Loss of appetite.   Pain in your forehead, behind your eyes, and over your cheekbones (sinus pain).  Muscle aches.  DIAGNOSIS  Your health care provider may diagnose a URI by:  Physical exam.  Tests to check that your symptoms are not due to another condition such as:  Strep throat.  Sinusitis.  Pneumonia.  Asthma. TREATMENT  A URI goes away on its own with time. It cannot be cured with medicines, but medicines may be prescribed or recommended to relieve symptoms. Medicines may help:  Reduce your fever.  Reduce your cough.  Relieve nasal congestion. HOME CARE INSTRUCTIONS   Take medicines only as directed by your health care provider.   Gargle warm saltwater or take cough drops to comfort your throat as directed by your health care provider.  Use a warm mist humidifier or inhale steam from a shower to increase air moisture. This may make it easier to breathe.  Drink enough fluid to keep your urine clear or pale yellow.   Eat soups and other clear broths and maintain good nutrition.   Rest as needed.   Return to work when  your temperature has returned to normal or as your health care provider advises. You may need to stay home longer to avoid infecting others. You can also use a face mask and careful hand washing to prevent spread of the virus.  Increase the usage of your inhaler if you have asthma.   Do not use any tobacco products, including cigarettes, chewing tobacco, or electronic cigarettes. If you need help quitting, ask your health care provider. PREVENTION  The best way to protect yourself from getting a cold  is to practice good hygiene.   Avoid oral or hand contact with people with cold symptoms.   Wash your hands often if contact occurs.  There is no clear evidence that vitamin C, vitamin E, echinacea, or exercise reduces the chance of developing a cold. However, it is always recommended to get plenty of rest, exercise, and practice good nutrition.  SEEK MEDICAL CARE IF:   You are getting worse rather than better.   Your symptoms are not controlled by medicine.   You have chills.  You have worsening shortness of breath.  You have brown or red mucus.  You have yellow or brown nasal discharge.  You have pain in your face, especially when you bend forward.  You have a fever.  You have swollen neck glands.  You have pain while swallowing.  You have white areas in the back of your throat. SEEK IMMEDIATE MEDICAL CARE IF:   You have severe or persistent:  Headache.  Ear pain.  Sinus pain.  Chest pain.  You have chronic lung disease and any of the following:  Wheezing.  Prolonged cough.  Coughing up blood.  A change in your usual mucus.  You have a stiff neck.  You have changes in your:  Vision.  Hearing.  Thinking.  Mood. MAKE SURE YOU:   Understand these instructions.  Will watch your condition.  Will get help right away if you are not doing well or get worse.   This information is not intended to replace advice given to you by your health care provider. Make sure you discuss any questions you have with your health care provider.   Document Released: 04/08/2001 Document Revised: 02/27/2015 Document Reviewed: 01/18/2014 Elsevier Interactive Patient Education [employer].

## 2015-12-24 NOTE — Progress Notes (Signed)
Subjective:    Patient ID: [name], female    DOB: 09-19-1934, 83 y.o.   MRN: [id number]  HPI  [name] is an 82 year old female who presents today with a chief complaint of cough. She also reports fatigue, shortness of breath, chills, sinus pressure. Denies fevers, sore throat. Her cough is mostly non productive. Her symptoms have been present since yesterday. She's taken Coricidin without significant improvement. Denies sick contacts, urinary symptoms, falls.   Review of Systems  Constitutional: Positive for chills and fatigue. Negative for fever.  HENT: Positive for sinus pressure. Negative for congestion, ear pain and sore throat.   Respiratory: Positive for cough. Negative for shortness of breath.   Gastrointestinal: Negative for nausea and vomiting.  Genitourinary: Negative for dysuria and frequency.  Neurological: Negative for headaches.       Past Medical History  Diagnosis Date  . Osteopenia   . Hyperlipidemia   . Hypertension   . PSVT (paroxysmal supraventricular tachycardia) ([place])   . Spinal stenosis   . Overactive bladder     urge incontinence  . Pulmonary emboli ([place])   . Pulmonary hypertension ([place])   . Right ventricular failure ([place])   . Arthritis     knee  . Anemia   . Diverticulosis   . Sleep apnea     CPAP  . Hx of colonic polyps 10/24/2014    Social History   Social History  . Marital Status: Widowed    Spouse Name: N/A  . Number of Children: 3  . Years of Education: N/A   Occupational History  . Retired   .     Social History Main Topics  . Smoking status: Never Smoker   . Smokeless tobacco: Never Used  . Alcohol Use: No  . Drug Use: No  . Sexual Activity: No   Other Topics Concern  . Not on file   Social History Narrative   Pt lives alone but dtr lives on property and has been staying with her since surgery.    Past Surgical History  Procedure Laterality Date  . Cataract extraction Bilateral 2008  . Bladder suspension   1993  . Total knee arthroplasty Left 2000  . Total knee revision  01/12/2012    Procedure: TOTAL KNEE REVISION;  Surgeon: Kerin Salen, MD;  Location: [place];  Service: Orthopedics;  Laterality: Left;  DEPUY/ LCS , HAND SET  . Trigger finger release  01/12/2012    Procedure: RELEASE TRIGGER FINGER/A-1 PULLEY;  Surgeon: Kerin Salen, MD;  Location: [place];  Service: Orthopedics;  Laterality: Right;  . Cholecystectomy    . Incisional hernia repair    . Total knee arthroplasty Left 10/29/2012    at [facility]  . Total knee arthroplasty Right 03/09/2014  . Colonoscopy    . Right heart catheterization N/A 07/04/2014    Procedure: RIGHT HEART CATH;  Surgeon: Jolaine Artist, MD;  Location: [facility] CATH LAB;  Service: Cardiovascular;  Laterality: N/A;  . Tonsillectomy and adenoidectomy    . Colonoscopy N/A 10/24/2014    Procedure: COLONOSCOPY;  Surgeon: Gatha Mayer, MD;  Location: [facility] ENDOSCOPY;  Service: Endoscopy;  Laterality: N/A;  . Cardiac catheterization N/A 02/28/2015    Procedure: Right/Left Heart Cath and Coronary Angiography;  Surgeon: Troy Sine, MD;  Location: [place] INVASIVE CV LAB CUPID;  Service: Cardiovascular;  Laterality: N/A;  . Electrophysiologic study N/A 03/02/2015    Procedure: SVT Ablation;  Surgeon: Evans Lance, MD;  Location: [place] CV LAB;  Service: Cardiovascular;  Laterality: N/A;    Family History  Problem Relation Age of Onset  . Breast cancer Mother   . Heart disease Father   . Diabetes Father   . Hypertension Father   . Uterine cancer Sister     Allergies  Allergen Reactions  . Zithromax [Azithromycin] Other (See Comments)    Prolonged QT on EKG  . Penicillins Hives  . Sulfonamide Derivatives Hives  . Adhesive [Tape] Other (See Comments) and Rash    redness  . Prednisone Other (See Comments)    Causes insomnia    Current Outpatient Prescriptions on File Prior to Visit  Medication Sig Dispense Refill  . acetaminophen (TYLENOL) 325 MG tablet Take 2 tablets  (650 mg total) by mouth every 4 (four) hours as needed for headache or mild pain.    Marland Kitchen apixaban (ELIQUIS) 2.5 MG TABS tablet Take 1 tablet (2.5 mg total) by mouth 2 (two) times daily. 60 tablet 11  . benzonatate (TESSALON) 200 MG capsule TAKE ONE CAPSULE BY MOUTH TWICE DAILY ASNEEDED FOR COUGH 180 capsule 2  . Calcium Carb-Cholecalciferol (CALCIUM 600/VITAMIN D3) 600-800 MG-UNIT TABS Take 1 tablet by mouth 2 (two) times daily.    . cephALEXin (KEFLEX) 500 MG capsule Take 500 mg by mouth 3 (three) times daily. Reported on 12/24/2015    . Cholecalciferol (VITAMIN D-1000 MAX ST) 1000 UNITS tablet Take 1,000 Units by mouth daily.    . diphenhydrAMINE (BENADRYL) 25 mg capsule Take 25 mg by mouth every morning.    . hydrochlorothiazide (HYDRODIURIL) 25 MG tablet Take 1 tablet (25 mg total) by mouth daily. 90 tablet 3  . HYDROcodone-homatropine (HYCODAN) 5-1.5 MG/5ML syrup Take 5 mLs by mouth every 8 (eight) hours as needed for cough (when not up and around or driving or working - caution of sedation). 120 mL 0  . metoprolol tartrate (LOPRESSOR) 25 MG tablet Take 0.5 tablets (12.5 mg total) by mouth 2 (two) times daily. 90 tablet 3  . Multiple Vitamin (MULTIVITAMIN WITH MINERALS) TABS tablet Take 1 tablet by mouth daily.    . naproxen sodium (ANAPROX) 220 MG tablet Take 220 mg by mouth daily as needed (pain).     . pantoprazole (PROTONIX) 40 MG tablet Take 1 tablet (40 mg total) by mouth daily. 30 tablet 11  . polyethylene glycol (MIRALAX / GLYCOLAX) packet Take 8.5 g by mouth daily as needed (CONSTIPATOIN).      No current facility-administered medications on file prior to visit.    BP 122/78 mmHg  Pulse 68  Temp(Src) 98.8 F (37.1 C) (Oral)  Ht 5\' 6"  (1.676 m)  Wt 195 lb 1.9 oz (88.506 kg)  BMI 31.51 kg/m2  SpO2 95%    Objective:   Physical Exam  Constitutional: She appears well-nourished.  HENT:  Right Ear: Tympanic membrane and ear canal normal.  Left Ear: Tympanic membrane and ear  canal normal.  Nose: Right sinus exhibits no maxillary sinus tenderness and no frontal sinus tenderness. Left sinus exhibits no maxillary sinus tenderness and no frontal sinus tenderness.  Mouth/Throat: Oropharynx is clear and moist.  Eyes: Conjunctivae are normal.  Neck: Neck supple.  Cardiovascular: Normal rate and regular rhythm.   Pulmonary/Chest: Effort normal and breath sounds normal. She has no wheezes. She has no rales.  Lymphadenopathy:    She has no cervical adenopathy.  Skin: Skin is warm and dry.          Assessment & Plan:  Viral URI:  Cough, fatigue, chills, sinus pressure since yesterday. No sick contacts, taken 1 day of coricidin. Exam with clear lungs which is reassuring. Vitals stable. Does not appear toxic or ill, although this is just day 2 of symptoms. Does not appear to be urinary related given URI symptoms. Suspect viral involvement at this point and will treat with supportive measures. Provided patient with strict return precautions.  She verbalized understanding.

## 2015-12-26 ENCOUNTER — Encounter: Payer: Self-pay | Admitting: Internal Medicine

## 2015-12-26 ENCOUNTER — Ambulatory Visit (INDEPENDENT_AMBULATORY_CARE_PROVIDER_SITE_OTHER)
Admission: RE | Admit: 2015-12-26 | Discharge: 2015-12-26 | Disposition: A | Discharge status: Home / Self Care | Payer: Medicare Other | Source: Ambulatory Visit | Attending: Internal Medicine | Admitting: Internal Medicine

## 2015-12-26 ENCOUNTER — Ambulatory Visit (INDEPENDENT_AMBULATORY_CARE_PROVIDER_SITE_OTHER): Payer: Medicare Other | Admitting: Internal Medicine

## 2015-12-26 VITALS — BP 120/80 | HR 73 | Temp 98.5°F

## 2015-12-26 DIAGNOSIS — R05 Cough: Secondary | ICD-10-CM

## 2015-12-26 DIAGNOSIS — I471 Supraventricular tachycardia: Secondary | ICD-10-CM

## 2015-12-26 DIAGNOSIS — R058 Other specified cough: Secondary | ICD-10-CM | POA: Insufficient documentation

## 2015-12-26 MED ORDER — DOXYCYCLINE HYCLATE 100 MG PO TABS: 100.0000 mg | ORAL_TABLET | Freq: Two times a day (BID) | ORAL | Status: DC

## 2015-12-26 NOTE — Progress Notes (Signed)
Pre visit review using our clinic review tool, if applicable. No additional management support is needed unless otherwise documented below in the visit note. 

## 2015-12-26 NOTE — Assessment & Plan Note (Addendum)
With fever and hypoxic respiratory failure Comfortable with oxygen Will check CXR--this looks okay but will await overread Given the new oxygen requirement--will go ahead with empiric antibiotic for apparent bronchitis 911 if sig worsening--recheck here if not improving in 48 hours

## 2015-12-26 NOTE — Patient Instructions (Signed)
Start the antibiotic today. Use your oxygen as needed. Call 911 if you have a significant problem breathing.  Call here if you are not getting better within 2 days.

## 2015-12-26 NOTE — Assessment & Plan Note (Addendum)
Now sounds irregular Will check EKG--this shows multiple atrial foci but no atrial fib This is consistent with past SSS so no action needed

## 2015-12-26 NOTE — Progress Notes (Signed)
Subjective:    Patient ID: [name], female    DOB: Aug 10, 1934, 82 y.o.   MRN: [id number]  HPI Here due to ongoing respiratory symptoms  Still sick Not eating or drinking well Started 4 days ago---with sneezing (thought it was allergies at first) Then cough started-- now with some sputum (thick brown) Fever to 101 yesterday and this AM---down with aleve No sweats or chills O2 sat in 80's at home---started her oxygen. Generally only uses it with the CPAP Doesn't really feel dyspneic though  No chest pain No palpitations No dizziness  Also tried chloracidin and tessalon--not helping  Current Outpatient Prescriptions on File Prior to Visit  Medication Sig Dispense Refill  . apixaban (ELIQUIS) 2.5 MG TABS tablet Take 1 tablet (2.5 mg total) by mouth 2 (two) times daily. 60 tablet 11  . benzonatate (TESSALON) 200 MG capsule TAKE ONE CAPSULE BY MOUTH TWICE DAILY ASNEEDED FOR COUGH 180 capsule 2  . Calcium Carb-Cholecalciferol (CALCIUM 600/VITAMIN D3) 600-800 MG-UNIT TABS Take 1 tablet by mouth 2 (two) times daily.    . cephALEXin (KEFLEX) 500 MG capsule Take 500 mg by mouth 3 (three) times daily. Reported on 12/24/2015    . Cholecalciferol (VITAMIN D-1000 MAX ST) 1000 UNITS tablet Take 1,000 Units by mouth daily.    . diphenhydrAMINE (BENADRYL) 25 mg capsule Take 25 mg by mouth every morning.    . hydrochlorothiazide (HYDRODIURIL) 25 MG tablet Take 1 tablet (25 mg total) by mouth daily. 90 tablet 3  . metoprolol tartrate (LOPRESSOR) 25 MG tablet Take 0.5 tablets (12.5 mg total) by mouth 2 (two) times daily. 90 tablet 3  . Multiple Vitamin (MULTIVITAMIN WITH MINERALS) TABS tablet Take 1 tablet by mouth daily.    . naproxen sodium (ANAPROX) 220 MG tablet Take 220 mg by mouth daily as needed (pain).     . pantoprazole (PROTONIX) 40 MG tablet Take 1 tablet (40 mg total) by mouth daily. 30 tablet 11  . polyethylene glycol (MIRALAX / GLYCOLAX) packet Take 8.5 g by mouth daily as needed  (CONSTIPATOIN).      No current facility-administered medications on file prior to visit.    Allergies  Allergen Reactions  . Zithromax [Azithromycin] Other (See Comments)    Prolonged QT on EKG  . Penicillins Hives  . Sulfonamide Derivatives Hives  . Adhesive [Tape] Other (See Comments) and Rash    redness  . Prednisone Other (See Comments)    Causes insomnia    Past Medical History  Diagnosis Date  . Osteopenia   . Hyperlipidemia   . Hypertension   . PSVT (paroxysmal supraventricular tachycardia) ([name])   . Spinal stenosis   . Overactive bladder     urge incontinence  . Pulmonary emboli ([place])   . Pulmonary hypertension ([place])   . Right ventricular failure ([place])   . Arthritis     knee  . Anemia   . Diverticulosis   . Sleep apnea     CPAP  . Hx of colonic polyps 10/24/2014    Past Surgical History  Procedure Laterality Date  . Cataract extraction Bilateral 2008  . Bladder suspension  1993  . Total knee arthroplasty Left 2000  . Total knee revision  01/12/2012    Procedure: TOTAL KNEE REVISION;  Surgeon: Kerin Salen, MD;  Location: [place];  Service: Orthopedics;  Laterality: Left;  DEPUY/ LCS , HAND SET  . Trigger finger release  01/12/2012    Procedure: RELEASE TRIGGER FINGER/A-1 PULLEY;  Surgeon: Kerin Salen, MD;  Location: [place];  Service: Orthopedics;  Laterality: Right;  . Cholecystectomy    . Incisional hernia repair    . Total knee arthroplasty Left 10/29/2012    at [facility]  . Total knee arthroplasty Right 03/09/2014  . Colonoscopy    . Right heart catheterization N/A 07/04/2014    Procedure: RIGHT HEART CATH;  Surgeon: Jolaine Artist, MD;  Location: [facility] CATH LAB;  Service: Cardiovascular;  Laterality: N/A;  . Tonsillectomy and adenoidectomy    . Colonoscopy N/A 10/24/2014    Procedure: COLONOSCOPY;  Surgeon: Gatha Mayer, MD;  Location: [facility] ENDOSCOPY;  Service: Endoscopy;  Laterality: N/A;  . Cardiac catheterization N/A 02/28/2015    Procedure: Right/Left  Heart Cath and Coronary Angiography;  Surgeon: Troy Sine, MD;  Location: [place] INVASIVE CV LAB CUPID;  Service: Cardiovascular;  Laterality: N/A;  . Electrophysiologic study N/A 03/02/2015    Procedure: SVT Ablation;  Surgeon: Evans Lance, MD;  Location: [place] CV LAB;  Service: Cardiovascular;  Laterality: N/A;    Family History  Problem Relation Age of Onset  . Breast cancer Mother   . Heart disease Father   . Diabetes Father   . Hypertension Father   . Uterine cancer Sister     Social History   Social History  . Marital Status: Widowed    Spouse Name: N/A  . Number of Children: 3  . Years of Education: N/A   Occupational History  . Retired   .     Social History Main Topics  . Smoking status: Never Smoker   . Smokeless tobacco: Never Used  . Alcohol Use: No  . Drug Use: No  . Sexual Activity: No   Other Topics Concern  . Not on file   Social History Narrative   Pt lives alone but dtr lives on property and has been staying with her since surgery.   Review of Systems  No vomiting or diarrhea No rash     Objective:   Physical Exam  Constitutional: She appears well-developed. No distress.  HENT:  No sinus tenderness (though she reports frontal pain) Mild nasal swelling Pharynx negative TMs normal  Neck: Normal range of motion. Neck supple. No thyromegaly present.  Cardiovascular: Normal rate and normal heart sounds.  Exam reveals no gallop.   No murmur heard. irregular  Pulmonary/Chest: Effort normal and breath sounds normal. No respiratory distress. She has no wheezes. She has no rales.  Lymphadenopathy:    She has no cervical adenopathy.          Assessment & Plan:

## 2016-01-03 ENCOUNTER — Ambulatory Visit (INDEPENDENT_AMBULATORY_CARE_PROVIDER_SITE_OTHER): Payer: Medicare Other | Admitting: Family Medicine

## 2016-01-03 VITALS — BP 108/68 | HR 56 | Temp 97.8°F | Ht 66.0 in | Wt 190.9 lb

## 2016-01-03 DIAGNOSIS — R05 Cough: Secondary | ICD-10-CM | POA: Diagnosis not present

## 2016-01-03 DIAGNOSIS — R059 Cough, unspecified: Secondary | ICD-10-CM

## 2016-01-03 NOTE — Patient Instructions (Signed)
Finish out antibiotic Increase hydration Consider over the counter Mucinex twice daily Continue with home oxygen Follow up for any fever or increased shortness of breath.

## 2016-01-03 NOTE — Progress Notes (Signed)
Pre visit review using our clinic review tool, if applicable. No additional management support is needed unless otherwise documented below in the visit note. 

## 2016-01-03 NOTE — Progress Notes (Signed)
Subjective:    Patient ID: [name], female    DOB: 1934-01-03, 82 y.o.   MRN: [id number]  HPI Patient seen for acute visit Normally followed at [facility]. She has been seen there twice recently with respiratory symptoms  Onset around February 26 of nasal congestion and cough. Felt to have viral illness. Not improving and subsequently seen March 1. At that point, she had productive cough and low-grade fever. Chest x-ray showed no pneumonia. EKG no atrial fibrillation and no acute changes. Patient placed on doxycycline 100 mg twice daily  She has home oxygen and O2 sats past few days room air running 89-90% and mid 90s with oxygen. Denies dyspnea at rest. Nonspecific malaise. Cough mostly nonproductive. She reportedly has pulmonary hypertension and obstructive sleep apnea and chronic hypoxic rest for a failure on O2 with CPAP History of PE-cor pulmonale, atrial fibrillation status post ablation May 2016  Past Medical History  Diagnosis Date  . Osteopenia   . Hyperlipidemia   . Hypertension   . PSVT (paroxysmal supraventricular tachycardia) ([place])   . Spinal stenosis   . Overactive bladder     urge incontinence  . Pulmonary emboli ([place])   . Pulmonary hypertension ([place])   . Right ventricular failure ([place])   . Arthritis     knee  . Anemia   . Diverticulosis   . Sleep apnea     CPAP  . Hx of colonic polyps 10/24/2014   Past Surgical History  Procedure Laterality Date  . Cataract extraction Bilateral 2008  . Bladder suspension  1993  . Total knee arthroplasty Left 2000  . Total knee revision  01/12/2012    Procedure: TOTAL KNEE REVISION;  Surgeon: Kerin Salen, MD;  Location: [place];  Service: Orthopedics;  Laterality: Left;  DEPUY/ LCS , HAND SET  . Trigger finger release  01/12/2012    Procedure: RELEASE TRIGGER FINGER/A-1 PULLEY;  Surgeon: Kerin Salen, MD;  Location: [place];  Service: Orthopedics;  Laterality: Right;  . Cholecystectomy    . Incisional  hernia repair    . Total knee arthroplasty Left 10/29/2012    at [facility]  . Total knee arthroplasty Right 03/09/2014  . Colonoscopy    . Right heart catheterization N/A 07/04/2014    Procedure: RIGHT HEART CATH;  Surgeon: Jolaine Artist, MD;  Location: [facility] CATH LAB;  Service: Cardiovascular;  Laterality: N/A;  . Tonsillectomy and adenoidectomy    . Colonoscopy N/A 10/24/2014    Procedure: COLONOSCOPY;  Surgeon: Gatha Mayer, MD;  Location: [facility] ENDOSCOPY;  Service: Endoscopy;  Laterality: N/A;  . Cardiac catheterization N/A 02/28/2015    Procedure: Right/Left Heart Cath and Coronary Angiography;  Surgeon: Troy Sine, MD;  Location: [place] INVASIVE CV LAB CUPID;  Service: Cardiovascular;  Laterality: N/A;  . Electrophysiologic study N/A 03/02/2015    Procedure: SVT Ablation;  Surgeon: Evans Lance, MD;  Location: [place] CV LAB;  Service: Cardiovascular;  Laterality: N/A;    reports that she has never smoked. She has never used smokeless tobacco. She reports that she does not drink alcohol or use illicit drugs. family history includes Breast cancer in her mother; Diabetes in her father; Heart disease in her father; Hypertension in her father; Uterine cancer in her sister. Allergies  Allergen Reactions  . Zithromax [Azithromycin] Other (See Comments)    Prolonged QT on EKG  . Penicillins Hives  . Sulfonamide Derivatives Hives  . Adhesive [Tape] Other (See Comments) and  Rash    redness  . Prednisone Other (See Comments)    Causes insomnia        Review of Systems  Constitutional: Positive for fatigue. Negative for fever and chills.  HENT: Negative for congestion and sore throat.   Respiratory: Positive for cough. Negative for shortness of breath and wheezing.   Cardiovascular: Negative for chest pain, palpitations and leg swelling.  Gastrointestinal: Negative for nausea, vomiting, abdominal pain and diarrhea.  Endocrine: Negative for polydipsia and polyuria.  Genitourinary: Negative  for dysuria.  Psychiatric/Behavioral: Negative for confusion.       Objective:   Physical Exam  Constitutional: She appears well-developed and well-nourished.  HENT:  Oropharynx slightly dry otherwise clear  Neck: Neck supple.  Cardiovascular: Normal rate and regular rhythm.   Pulmonary/Chest: Effort normal and breath sounds normal. No respiratory distress. She has no wheezes. She has no rales.  Musculoskeletal: She exhibits no edema.  Lymphadenopathy:    She has no cervical adenopathy.  Neurological: She is alert.          Assessment & Plan:  Persistent cough. Patient has nonfocal exam. Recent chest x-ray unremarkable. Finishing out course of doxycycline. No respiratory distress. Initial O2 sats today off oxygen on room air 96% but subsequently dropped around 90% after about 15 minutes. She has been very stable symptomatically at home on oxygen. Suggest adding Mucinex twice daily. Increase hydration. Finish out antibiotic. Follow-up promptly for any recurrent fever or increased shortness of breath.

## 2016-01-06 ENCOUNTER — Encounter (HOSPITAL_COMMUNITY): Payer: Self-pay | Admitting: Emergency Medicine

## 2016-01-06 ENCOUNTER — Emergency Department (HOSPITAL_COMMUNITY)
Admission: EM | Admit: 2016-01-06 | Discharge: 2016-01-07 | Disposition: A | Discharge status: Home / Self Care | Payer: Medicare Other | Attending: Emergency Medicine | Admitting: Emergency Medicine

## 2016-01-06 DIAGNOSIS — I471 Supraventricular tachycardia: Secondary | ICD-10-CM | POA: Diagnosis not present

## 2016-01-06 DIAGNOSIS — Z9889 Other specified postprocedural states: Secondary | ICD-10-CM | POA: Insufficient documentation

## 2016-01-06 DIAGNOSIS — Z86010 Personal history of colonic polyps: Secondary | ICD-10-CM | POA: Insufficient documentation

## 2016-01-06 DIAGNOSIS — M179 Osteoarthritis of knee, unspecified: Secondary | ICD-10-CM | POA: Diagnosis not present

## 2016-01-06 DIAGNOSIS — Z9981 Dependence on supplemental oxygen: Secondary | ICD-10-CM | POA: Insufficient documentation

## 2016-01-06 DIAGNOSIS — G4730 Sleep apnea, unspecified: Secondary | ICD-10-CM | POA: Insufficient documentation

## 2016-01-06 DIAGNOSIS — K429 Umbilical hernia without obstruction or gangrene: Secondary | ICD-10-CM | POA: Diagnosis not present

## 2016-01-06 DIAGNOSIS — Z880 Allergy status to penicillin: Secondary | ICD-10-CM | POA: Diagnosis not present

## 2016-01-06 DIAGNOSIS — Z862 Personal history of diseases of the blood and blood-forming organs and certain disorders involving the immune mechanism: Secondary | ICD-10-CM | POA: Insufficient documentation

## 2016-01-06 DIAGNOSIS — Z9049 Acquired absence of other specified parts of digestive tract: Secondary | ICD-10-CM | POA: Diagnosis not present

## 2016-01-06 DIAGNOSIS — Z7901 Long term (current) use of anticoagulants: Secondary | ICD-10-CM | POA: Diagnosis not present

## 2016-01-06 DIAGNOSIS — I10 Essential (primary) hypertension: Secondary | ICD-10-CM | POA: Insufficient documentation

## 2016-01-06 DIAGNOSIS — Z8639 Personal history of other endocrine, nutritional and metabolic disease: Secondary | ICD-10-CM | POA: Diagnosis not present

## 2016-01-06 DIAGNOSIS — K5909 Other constipation: Secondary | ICD-10-CM

## 2016-01-06 DIAGNOSIS — Z87448 Personal history of other diseases of urinary system: Secondary | ICD-10-CM | POA: Insufficient documentation

## 2016-01-06 DIAGNOSIS — Z79899 Other long term (current) drug therapy: Secondary | ICD-10-CM | POA: Diagnosis not present

## 2016-01-06 DIAGNOSIS — Z86711 Personal history of pulmonary embolism: Secondary | ICD-10-CM | POA: Insufficient documentation

## 2016-01-06 DIAGNOSIS — M8580 Other specified disorders of bone density and structure, unspecified site: Secondary | ICD-10-CM | POA: Insufficient documentation

## 2016-01-06 DIAGNOSIS — R109 Unspecified abdominal pain: Secondary | ICD-10-CM | POA: Diagnosis present

## 2016-01-06 LAB — COMPREHENSIVE METABOLIC PANEL
ALT: 12 U/L — AB (ref 14–54)
ANION GAP: 16 — AB (ref 5–15)
AST: 26 U/L (ref 15–41)
Albumin: 3.2 g/dL — ABNORMAL LOW (ref 3.5–5.0)
Alkaline Phosphatase: 67 U/L (ref 38–126)
BUN: 21 mg/dL — ABNORMAL HIGH (ref 6–20)
CHLORIDE: 88 mmol/L — AB (ref 101–111)
CO2: 34 mmol/L — ABNORMAL HIGH (ref 22–32)
CREATININE: 0.99 mg/dL (ref 0.44–1.00)
Calcium: 10.2 mg/dL (ref 8.9–10.3)
GFR, EST NON AFRICAN AMERICAN: 52 mL/min — AB (ref >60)
Glucose, Bld: 110 mg/dL — ABNORMAL HIGH (ref 65–99)
POTASSIUM: 3.2 mmol/L — AB (ref 3.5–5.1)
Sodium: 138 mmol/L (ref 135–145)
Total Bilirubin: 1.1 mg/dL (ref 0.3–1.2)
Total Protein: 7.5 g/dL (ref 6.5–8.1)

## 2016-01-06 LAB — CBC
HEMATOCRIT: 43.7 % (ref 36.0–46.0)
HEMOGLOBIN: 13.9 g/dL (ref 12.0–15.0)
MCH: 29.3 pg (ref 26.0–34.0)
MCHC: 31.8 g/dL (ref 30.0–36.0)
MCV: 92.2 fL (ref 78.0–100.0)
Platelets: 312 10*3/uL (ref 150–400)
RBC: 4.74 MIL/uL (ref 3.87–5.11)
RDW: 13.9 % (ref 11.5–15.5)
WBC: 7.5 10*3/uL (ref 4.0–10.5)

## 2016-01-06 LAB — URINALYSIS, ROUTINE W REFLEX MICROSCOPIC
BILIRUBIN URINE: NEGATIVE
Glucose, UA: NEGATIVE mg/dL
Hgb urine dipstick: NEGATIVE
Ketones, ur: NEGATIVE mg/dL
NITRITE: NEGATIVE
Protein, ur: NEGATIVE mg/dL
SPECIFIC GRAVITY, URINE: 1.022 (ref 1.005–1.030)
pH: 7 (ref 5.0–8.0)

## 2016-01-06 LAB — URINE MICROSCOPIC-ADD ON
BACTERIA UA: NONE SEEN
RBC / HPF: NONE SEEN RBC/hpf (ref 0–5)
WBC UA: NONE SEEN WBC/hpf (ref 0–5)

## 2016-01-06 LAB — LIPASE, BLOOD: LIPASE: 24 U/L (ref 11–51)

## 2016-01-06 NOTE — ED Notes (Signed)
States she had hernia "pop out" just to the left of umbilicus 1 1/2 hours ago when she stood up.  States she is unable to palpate hernia now.  C/o mild pain to L abd.

## 2016-01-07 ENCOUNTER — Encounter (HOSPITAL_COMMUNITY): Payer: Self-pay | Admitting: Emergency Medicine

## 2016-01-07 ENCOUNTER — Emergency Department (HOSPITAL_COMMUNITY): Payer: Medicare Other

## 2016-01-07 MED ORDER — BACITRACIN ZINC 500 UNIT/GM EX OINT: TOPICAL_OINTMENT | Freq: Two times a day (BID) | CUTANEOUS | Status: DC

## 2016-01-07 MED ORDER — POLYETHYLENE GLYCOL 3350 17 GM/SCOOP PO POWD: 17.0000 g | Freq: Every day | ORAL | Status: DC

## 2016-01-07 MED ORDER — ACETAMINOPHEN 500 MG PO TABS
1000.0000 mg | ORAL_TABLET | Freq: Once | ORAL | Status: DC
  Filled 2016-01-07: qty 2

## 2016-01-07 MED ORDER — NAPROXEN 250 MG PO TABS
500.0000 mg | ORAL_TABLET | Freq: Once | ORAL | Status: DC
  Filled 2016-01-07: qty 2

## 2016-01-07 MED ORDER — NAPROXEN 375 MG PO TABS: 375.0000 mg | ORAL_TABLET | Freq: Two times a day (BID) | ORAL | Status: DC

## 2016-01-07 NOTE — Discharge Instructions (Signed)
Constipation, Adult °Constipation is when a person has fewer than three bowel movements a week, has difficulty having a bowel movement, or has stools that are dry, hard, or larger than normal. As people grow older, constipation is more common. A low-fiber diet, not taking in enough fluids, and taking certain medicines may make constipation worse.  °CAUSES  °· Certain medicines, such as antidepressants, pain medicine, iron supplements, antacids, and water pills.   °· Certain diseases, such as diabetes, irritable bowel syndrome (IBS), thyroid disease, or depression.   °· Not drinking enough water.   °· Not eating enough fiber-rich foods.   °· Stress or travel.   °· Lack of physical activity or exercise.   °· Ignoring the urge to have a bowel movement.   °· Using laxatives too much.   °SIGNS AND SYMPTOMS  °· Having fewer than three bowel movements a week.   °· Straining to have a bowel movement.   °· Having stools that are hard, dry, or larger than normal.   °· Feeling full or bloated.   °· Pain in the lower abdomen.   °· Not feeling relief after having a bowel movement.   °DIAGNOSIS  °Your health care provider will take a medical history and perform a physical exam. Further testing may be done for severe constipation. Some tests may include: °· A barium enema X-ray to examine your rectum, colon, and, sometimes, your small intestine.   °· A sigmoidoscopy to examine your lower colon.   °· A colonoscopy to examine your entire colon. °TREATMENT  °Treatment will depend on the severity of your constipation and what is causing it. Some dietary treatments include drinking more fluids and eating more fiber-rich foods. Lifestyle treatments may include regular exercise. If these diet and lifestyle recommendations do not help, your health care provider may recommend taking over-the-counter laxative medicines to help you have bowel movements. Prescription medicines may be prescribed if over-the-counter medicines do not work.    °HOME CARE INSTRUCTIONS  °· Eat foods that have a lot of fiber, such as fruits, vegetables, whole grains, and beans. °· Limit foods high in fat and processed sugars, such as french fries, hamburgers, cookies, candies, and soda.   °· A fiber supplement may be added to your diet if you cannot get enough fiber from foods.   °· Drink enough fluids to keep your urine clear or pale yellow.   °· Exercise regularly or as directed by your health care provider.   °· Go to the restroom when you have the urge to go. Do not hold it.   °· Only take over-the-counter or prescription medicines as directed by your health care provider. Do not take other medicines for constipation without talking to your health care provider first.   °SEEK IMMEDIATE MEDICAL CARE IF:  °· You have bright red blood in your stool.   °· Your constipation lasts for more than 4 days or gets worse.   °· You have abdominal or rectal pain.   °· You have thin, pencil-like stools.   °· You have unexplained weight loss. °MAKE SURE YOU:  °· Understand these instructions. °· Will watch your condition. °· Will get help right away if you are not doing well or get worse. °  °This information is not intended to replace advice given to you by your health care provider. Make sure you discuss any questions you have with your health care provider. °  °Document Released: 07/11/2004 Document Revised: 11/03/2014 Document Reviewed: 07/25/2013 °Elsevier Interactive Patient Education ©2016 Elsevier Inc. ° °High-Fiber Diet °Fiber, also called dietary fiber, is a type of carbohydrate found in fruits, vegetables, whole grains, and   beans. A high-fiber diet can have many health benefits. Your health care provider may recommend a high-fiber diet to help: °· Prevent constipation. Fiber can make your bowel movements more regular. °· Lower your cholesterol. °· Relieve hemorrhoids, uncomplicated diverticulosis, or irritable bowel syndrome. °· Prevent overeating as part of a weight-loss  plan. °· Prevent heart disease, type 2 diabetes, and certain cancers. °WHAT IS MY PLAN? °The recommended daily intake of fiber includes: °· 38 grams for men under age 50. °· 30 grams for men over age 50. °· 25 grams for women under age 50. °· 21 grams for women over age 50. °You can get the recommended daily intake of dietary fiber by eating a variety of fruits, vegetables, grains, and beans. Your health care provider may also recommend a fiber supplement if it is not possible to get enough fiber through your diet. °WHAT DO I NEED TO KNOW ABOUT A HIGH-FIBER DIET? °· Fiber supplements have not been widely studied for their effectiveness, so it is better to get fiber through food sources. °· Always check the fiber content on the nutrition facts label of any prepackaged food. Look for foods that contain at least 5 grams of fiber per serving. °· Ask your dietitian if you have questions about specific foods that are related to your condition, especially if those foods are not listed in the following section. °· Increase your daily fiber consumption gradually. Increasing your intake of dietary fiber too quickly may cause bloating, cramping, or gas. °· Drink plenty of water. Water helps you to digest fiber. °WHAT FOODS CAN I EAT? °Grains °Whole-grain breads. Multigrain cereal. Oats and oatmeal. Brown rice. Barley. Bulgur wheat. Millet. Bran muffins. Popcorn. Rye wafer crackers. °Vegetables °Sweet potatoes. Spinach. Kale. Artichokes. Cabbage. Broccoli. Green peas. Carrots. Squash. °Fruits °Berries. Pears. Apples. Oranges. Avocados. Prunes and raisins. Dried figs. °Meats and Other Protein Sources °Navy, kidney, pinto, and soy beans. Split peas. Lentils. Nuts and seeds. °Dairy °Fiber-fortified yogurt. °Beverages °Fiber-fortified soy milk. Fiber-fortified orange juice. °Other °Fiber bars. °The items listed above may not be a complete list of recommended foods or beverages. Contact your dietitian for more options. °WHAT FOODS  ARE NOT RECOMMENDED? °Grains °White bread. Pasta made with refined flour. White rice. °Vegetables °Fried potatoes. Canned vegetables. Well-cooked vegetables.  °Fruits °Fruit juice. Cooked, strained fruit. °Meats and Other Protein Sources °Fatty cuts of meat. Fried poultry or fried fish. °Dairy °Milk. Yogurt. Cream cheese. Sour cream. °Beverages °Soft drinks. °Other °Cakes and pastries. Butter and oils. °The items listed above may not be a complete list of foods and beverages to avoid. Contact your dietitian for more information. °WHAT ARE SOME TIPS FOR INCLUDING HIGH-FIBER FOODS IN MY DIET? °· Eat a wide variety of high-fiber foods. °· Make sure that half of all grains consumed each day are whole grains. °· Replace breads and cereals made from refined flour or white flour with whole-grain breads and cereals. °· Replace white rice with brown rice, bulgur wheat, or millet. °· Start the day with a breakfast that is high in fiber, such as a cereal that contains at least 5 grams of fiber per serving. °· Use beans in place of meat in soups, salads, or pasta. °· Eat high-fiber snacks, such as berries, raw vegetables, nuts, or popcorn. °  °This information is not intended to replace advice given to you by your health care provider. Make sure you discuss any questions you have with your health care provider. °  °Document Released: 10/13/2005 Document Revised: 11/03/2014 Document   Reviewed: 03/28/2014 °Elsevier Interactive Patient Education ©2016 Elsevier Inc. ° °

## 2016-01-07 NOTE — ED Provider Notes (Signed)
CSN: [id number]     Arrival date & time 01/06/16  1920 History  By signing my name below, I, Altamease Oiler, attest that this documentation has been prepared under the direction and in the presence of Louna Rothgeb, MD. Electronically Signed: Altamease Oiler, ED Scribe. 01/07/2016. 1:29 AM     Chief Complaint  Patient presents with  . Hernia   Patient is a 82 y.o. female presenting with abdominal pain. The history is provided by the patient. No language interpreter was used.  Abdominal Pain Pain location: umbilical hernia. Pain quality: sharp   Pain radiates to:  Does not radiate Pain severity:  Mild Onset quality:  Sudden Timing:  Constant Progression:  Resolved Chronicity:  New Context: not trauma   Relieved by:  Nothing Worsened by:  Nothing tried Ineffective treatments:  None tried Associated symptoms: no chills, no cough, no fever, no nausea and no vomiting   Risk factors: being elderly    POSEY JACOBE is a 82 y.o. female with history of diverticulosis who presents to the Emergency Department complaining of mild shooting pain at the site of an abdominal hernia left of the umbilicus with onset today. She has been aware of the hernia for more than 1 year and her doctor does not believe that it will need surgery. Today the hernia appeared to be out and was painful, which is new. She took nothing for pain at home but the pain has since resolved and she is no longer able to palpate the hernia.  Pt denies any recent heavy lifting.    Past Medical History  Diagnosis Date  . Osteopenia   . Hyperlipidemia   . Hypertension   . PSVT (paroxysmal supraventricular tachycardia) ([place])   . Spinal stenosis   . Overactive bladder     urge incontinence  . Pulmonary emboli ([place])   . Pulmonary hypertension ([place])   . Right ventricular failure ([place])   . Arthritis     knee  . Anemia   . Diverticulosis   . Sleep apnea     CPAP  . Hx of colonic polyps 10/24/2014   Past Surgical History   Procedure Laterality Date  . Cataract extraction Bilateral 2008  . Bladder suspension  1993  . Total knee arthroplasty Left 2000  . Total knee revision  01/12/2012    Procedure: TOTAL KNEE REVISION;  Surgeon: Kerin Salen, MD;  Location: [place];  Service: Orthopedics;  Laterality: Left;  DEPUY/ LCS , HAND SET  . Trigger finger release  01/12/2012    Procedure: RELEASE TRIGGER FINGER/A-1 PULLEY;  Surgeon: Kerin Salen, MD;  Location: [place];  Service: Orthopedics;  Laterality: Right;  . Cholecystectomy    . Incisional hernia repair    . Total knee arthroplasty Left 10/29/2012    at [facility]  . Total knee arthroplasty Right 03/09/2014  . Colonoscopy    . Right heart catheterization N/A 07/04/2014    Procedure: RIGHT HEART CATH;  Surgeon: Jolaine Artist, MD;  Location: [facility] CATH LAB;  Service: Cardiovascular;  Laterality: N/A;  . Tonsillectomy and adenoidectomy    . Colonoscopy N/A 10/24/2014    Procedure: COLONOSCOPY;  Surgeon: Gatha Mayer, MD;  Location: [facility] ENDOSCOPY;  Service: Endoscopy;  Laterality: N/A;  . Cardiac catheterization N/A 02/28/2015    Procedure: Right/Left Heart Cath and Coronary Angiography;  Surgeon: Troy Sine, MD;  Location: [place] INVASIVE CV LAB CUPID;  Service: Cardiovascular;  Laterality: N/A;  . Electrophysiologic study N/A 03/02/2015  Procedure: SVT Ablation;  Surgeon: Evans Lance, MD;  Location: [name] CV LAB;  Service: Cardiovascular;  Laterality: N/A;   Family History  Problem Relation Age of Onset  . Breast cancer Mother   . Heart disease Father   . Diabetes Father   . Hypertension Father   . Uterine cancer Sister    Social History  Substance Use Topics  . Smoking status: Never Smoker   . Smokeless tobacco: Never Used  . Alcohol Use: No   OB History    No data available     Review of Systems  Constitutional: Negative for fever and chills.  Respiratory: Negative for cough.   Gastrointestinal: Positive for abdominal pain. Negative for nausea  and vomiting.  All other systems reviewed and are negative.  Allergies  Zithromax; Penicillins; Sulfonamide derivatives; Adhesive; and Prednisone  Home Medications   Prior to Admission medications   Medication Sig Start Date End Date Taking? Authorizing Provider  apixaban (ELIQUIS) 2.5 MG TABS tablet Take 1 tablet (2.5 mg total) by mouth 2 (two) times daily. 12/03/15  Yes Thompson Grayer, MD  Calcium Carb-Cholecalciferol (CALCIUM 600/VITAMIN D3) 600-800 MG-UNIT TABS Take 1 tablet by mouth 2 (two) times daily.   Yes Historical Provider, MD  Cholecalciferol (VITAMIN D-1000 MAX ST) 1000 UNITS tablet Take 1,000 Units by mouth daily. 09/08/15  Yes Historical Provider, MD  diphenhydrAMINE (BENADRYL) 25 mg capsule Take 25 mg by mouth every morning.   Yes Historical Provider, MD  hydrochlorothiazide (HYDRODIURIL) 25 MG tablet Take 1 tablet (25 mg total) by mouth daily. 10/26/15  Yes Abner Greenspan, MD  metoprolol tartrate (LOPRESSOR) 25 MG tablet Take 0.5 tablets (12.5 mg total) by mouth 2 (two) times daily. 12/03/15  Yes Thompson Grayer, MD  Multiple Vitamin (MULTIVITAMIN WITH MINERALS) TABS tablet Take 1 tablet by mouth daily.   Yes Historical Provider, MD  naproxen sodium (ANAPROX) 220 MG tablet Take 220 mg by mouth daily as needed (pain).    Yes Historical Provider, MD  pantoprazole (PROTONIX) 40 MG tablet Take 1 tablet (40 mg total) by mouth daily. 09/26/15  Yes [place], MD  polyethylene glycol (MIRALAX / GLYCOLAX) packet Take 8.5 g by mouth daily as needed (CONSTIPATOIN).    Yes Historical Provider, MD  benzonatate (TESSALON) 200 MG capsule TAKE ONE CAPSULE BY MOUTH TWICE DAILY ASNEEDED FOR COUGH Patient not taking: Reported on 01/07/2016 05/07/15   Abner Greenspan, MD  doxycycline (VIBRA-TABS) 100 MG tablet Take 1 tablet (100 mg total) by mouth 2 (two) times daily. Patient not taking: Reported on 01/07/2016 12/26/15   Venia Carbon, MD   BP 99/67 mmHg  Pulse 56  Temp(Src) 98.5 F (36.9 C) (Oral)   Resp 14  Ht 5\' 6"  (1.676 m)  Wt 192 lb 6 oz (87.261 kg)  BMI 31.07 kg/m2  SpO2 96% Physical Exam  Constitutional: She is oriented to person, place, and time. She appears well-developed and well-nourished.  HENT:  Head: Normocephalic.  Mouth/Throat: Oropharynx is clear and moist.  Moist mucous membranes No exudate  Eyes: Pupils are equal, round, and reactive to light.  Neck: Normal range of motion. Neck supple.  Trachea midline  Cardiovascular: Normal rate, regular rhythm and intact distal pulses.   Pulmonary/Chest: Effort normal and breath sounds normal. No stridor. She has no wheezes. She has no rales.  Abdominal: Soft. Bowel sounds are normal. She exhibits no mass. There is no rebound and no guarding.  There would be a fat containing hernia  at the umbilicus that is completely reduces Abrasion in the umbilicus No additional hernias  Musculoskeletal: Normal range of motion.  Pelvis is stable  Neurological: She is alert and oriented to person, place, and time.  Skin: Skin is warm and dry.  Psychiatric: She has a normal mood and affect. Her behavior is normal.  Nursing note and vitals reviewed.   ED Course  Procedures (including critical care time) DIAGNOSTIC STUDIES: Oxygen Saturation is 96% on RA,  normal by my interpretation.    COORDINATION OF CARE: 1:22 AM Discussed treatment plan which includes lab work and pain management with pt at bedside and pt agreed to plan.  Labs Review Labs Reviewed  COMPREHENSIVE METABOLIC PANEL - Abnormal; Notable for the following:    Potassium 3.2 (*)    Chloride 88 (*)    CO2 34 (*)    Glucose, Bld 110 (*)    BUN 21 (*)    Albumin 3.2 (*)    ALT 12 (*)    GFR calc non Af Amer 52 (*)    Anion gap 16 (*)    All other components within normal limits  URINALYSIS, ROUTINE W REFLEX MICROSCOPIC (NOT AT [facility]) - Abnormal; Notable for the following:    Leukocytes, UA TRACE (*)    All other components within normal limits  URINE  MICROSCOPIC-ADD ON - Abnormal; Notable for the following:    Squamous Epithelial / LPF 6-30 (*)    All other components within normal limits  LIPASE, BLOOD  CBC    Imaging Review No results found. I have personally reviewed and evaluated these lab results as part of my medical decision-making.   EKG Interpretation None      MDM   Final diagnoses:  None    Hernia already reduced at the time of the exam.  No obstruction on XRAY lots of stool will start miralax as constipation can increase intraabdominal pressure and cause hernia to come out.  Tylenol for pain follow up closely with your PMD.  Strict return precautions given   I personally performed the services described in this documentation, which was scribed in my presence. The recorded information has been reviewed and is accurate.     Veatrice Kells, MD 01/07/16 [phone]

## 2016-01-09 ENCOUNTER — Encounter: Payer: Self-pay | Admitting: Family Medicine

## 2016-01-09 ENCOUNTER — Ambulatory Visit (INDEPENDENT_AMBULATORY_CARE_PROVIDER_SITE_OTHER): Payer: Medicare Other | Admitting: Family Medicine

## 2016-01-09 VITALS — BP 110/60 | HR 53 | Temp 97.3°F | Ht 66.0 in | Wt 195.5 lb

## 2016-01-09 DIAGNOSIS — I10 Essential (primary) hypertension: Secondary | ICD-10-CM

## 2016-01-09 DIAGNOSIS — K5900 Constipation, unspecified: Secondary | ICD-10-CM

## 2016-01-09 DIAGNOSIS — K429 Umbilical hernia without obstruction or gangrene: Secondary | ICD-10-CM | POA: Diagnosis not present

## 2016-01-09 NOTE — Patient Instructions (Addendum)
I think that stool back up (constipation) caused your hernia to get bigger  I am reassured that it is now reducible and pain is gone - but if symptoms return- let me know right away If tolerated-advance miralax to 1 whole cap ful daily  Also eat fiber  Goal is to create more frequent and soft bowel movements until the "back up" is resolved  Then you can drop back on mirlax to whatever dose produces regular bowel movements that are not too hard    Keep me posted re: hernia/size/pain   I'm glad you are doing better  If you get dizzy from your low blood pressure- let me know

## 2016-01-09 NOTE — Progress Notes (Signed)
Pre visit review using our clinic review tool, if applicable. No additional management support is needed unless otherwise documented below in the visit note. 

## 2016-01-09 NOTE — Progress Notes (Signed)
Subjective:    Patient ID: [name], female    DOB: Mar 01, 1934, 82 y.o.   MRN: [id number]  HPI Here for f/u of ED visit on 01/07/16 for abd pain from umbilical hernia   It was reducible at the time of exam and symptoms improved  Per daughter - it had popped up much bigger than usual (no straining or lifting) with "really bad pain"  By the time they saw her it had gone  Since then - it is about the same     Dg Chest 2 View  12/26/2015  CLINICAL DATA:  Productive cough EXAM: CHEST  2 VIEW COMPARISON:  Chest x-ray of 09/26/2015 FINDINGS: No focal infiltrate or effusion is seen. Previous mild linear atelectasis of the left lung base has resolved. Mediastinal and hilar contours are unremarkable. The heart is borderline enlarged. No acute bony abnormality seen with degenerative change throughout the mid to lower thoracic spine. IMPRESSION: No active cardiopulmonary disease.  Stable borderline cardiomegaly. Electronically Signed   By: Ivar Drape M.D.   On: 12/26/2015 13:36   Dg Abd Acute W/chest  01/07/2016  CLINICAL DATA:  Hernia popped out to the left of the umbilicus, acute onset, with mild left-sided abdominal pain. Initial encounter. EXAM: DG ABDOMEN ACUTE W/ 1V CHEST COMPARISON:  Chest radiograph from 12/26/2015 FINDINGS: The lungs are well-aerated. Mild peribronchial thickening and seen. There is no evidence of focal opacification, pleural effusion or pneumothorax. The cardiomediastinal silhouette is mildly enlarged. The visualized bowel gas pattern is unremarkable. Scattered stool and air are seen within the colon; there is no evidence of small bowel dilatation to suggest obstruction. No free intra-abdominal air is identified on the provided upright view. No acute osseous abnormalities are seen; the sacroiliac joints are unremarkable in appearance. IMPRESSION: 1. Unremarkable bowel gas pattern; no free intra-abdominal air seen. Moderate amount of stool noted in the colon. 2. Mild  peribronchial thickening noted.  Mild cardiomegaly. Electronically Signed   By: Garald Balding M.D.   On: 01/07/2016 02:11    Mod amt of stool- susp that her constipation may have worsened pressure on hernia and caused symptoms  miralax was recommended for this   Using 1/2 capful once daily - works fine  She is having a BM now - once per day- it is generally hard  Has not tried a full cap ful yet   No more pain right now  No abd cramping   Patient Active Problem List   Diagnosis Date Noted  . Constipation 01/09/2016  . Productive cough 12/26/2015  . Chronic cough 11/19/2015  . Umbilical hernia [id number]  . Chronic respiratory failure ([place]) 10/05/2015  . Thromboembolism of vein 10/02/2015  . Infection of prosthetic joint ([place]) 09/18/2015  . Infection of prosthetic left knee joint ([place]) 09/12/2015  . Hospital-acquired pneumonia 09/12/2015  . Aspiration pneumonia ([place]) 09/08/2015  . Effusion of knee 08/30/2015  . Insect bite, infected 07/11/2015  . Pulmonary hypertension ([place])   . Chest pain 02/25/2015  . Physical deconditioning 02/25/2015  . RBBB-new 02/12/2015  . History of bilat PE after TKR (off Coumadin Dec 2015) 02/12/2015  . Abnormal EKG-TWI, RBBB, prolonged QT   . Hernia, umbilical [id number]  . Hx of colonic polyps 10/24/2014  . Diverticulosis of colon without hemorrhage   . OSA (obstructive sleep apnea) 07/22/2014  . Unspecified arthropathy, lower leg 03/21/2014  . History of right knee joint replacement 03/15/2014  . Arthritis of knee 03/09/2014  . Solitary pulmonary nodule 05/03/2013  .  Acute cor pulmonale ([place]) 04/26/2013  . Sternal fracture 04/26/2013  . Acute right ventricular heart failure ([place]) 12/09/2012  . PE (pulmonary embolism) 11/24/2012  . DVT (deep venous thrombosis) ([place]) 11/24/2012  . History of artificial joint 10/29/2012  . 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 BP (high blood pressure) 10/14/2012  . Complications due to  internal joint prosthesis ([place]) 09/07/2012  . PES PLANUS 08/15/2010  . URINARY FREQUENCY, CHRONIC 07/14/2008  . Dyslipidemia 07/09/2007  . Essential hypertension 07/09/2007  . PSVT with SSS 02/23/2007  . SCLERODERMA 02/23/2007  . DEGENERATIVE JOINT DISEASE 02/23/2007  . SPINAL STENOSIS, LUMBAR 02/23/2007  . OSTEOPENIA 02/23/2007  . SPONDYLOLISTHESIS 02/23/2007   Past Medical History  Diagnosis Date  . Osteopenia   . Hyperlipidemia   . Hypertension   . PSVT (paroxysmal supraventricular tachycardia) ([place])   . Spinal stenosis   . Overactive bladder     urge incontinence  . Pulmonary emboli ([place])   . Pulmonary hypertension ([place])   . Right ventricular failure ([place])   . Arthritis     knee  . Anemia   . Diverticulosis   . Sleep apnea     CPAP  . Hx of colonic polyps 10/24/2014   Past Surgical History  Procedure Laterality Date  . Cataract extraction Bilateral 2008  . Bladder suspension  1993  . Total knee arthroplasty Left 2000  . Total knee revision  01/12/2012    Procedure: TOTAL KNEE REVISION;  Surgeon: Kerin Salen, MD;  Location: [facility];  Service: Orthopedics;  Laterality: Left;  DEPUY/ LCS , HAND SET  . Trigger finger release  01/12/2012    Procedure: RELEASE TRIGGER FINGER/A-1 PULLEY;  Surgeon: Kerin Salen, MD;  Location: [facility];  Service: Orthopedics;  Laterality: Right;  . Cholecystectomy    . Incisional hernia repair    . Total knee arthroplasty [facility] 10/29/2012    at [facility]  . Total knee arthroplasty Right 03/09/2014  . Colonoscopy    . Right heart catheterization N/A 07/04/2014    Procedure: RIGHT HEART CATH;  Surgeon: Jolaine Artist, MD;  Location[facility] CATH LAB;  Service: Cardiovascular;  Laterality: N/A;  . Tonsillectomy and adenoidectomy    . Colonoscopy N/A 10/24/2014    Procedure: COLONOSCOPY;  Surgeon: Gatha Mayer, MD;  Location: [facility] ENDOSCOPY;  Service: Endoscopy;  Laterality: N/A;  . Cardiac catheterization N/A 02/28/2015    Procedure: Right/Left Heart Cath  and Coronary Angiography;  Surgeon: Troy Sine, MD;  Location: [facility] INVASIVE CV LAB CUPID;  Service: Cardiovascular;  Laterality: N/A;  . Electrophysiologic study N/A 03/02/2015    Procedure: SVT Ablation;  Surgeon: Evans Lance, MD;  Location: [facility] CV LAB;  Service: Cardiovascular;  Laterality: N/A;   Social History  Substance Use Topics  . Smoking status: Never Smoker   . Smokeless tobacco: Never Used  . Alcohol Use: No   Family History  Problem Relation Age of Onset  . Breast cancer Mother   . Heart disease Father   . Diabetes Father   . Hypertension Father   . Uterine cancer Sister    Allergies  Allergen Reactions  . Zithromax [Azithromycin] Other (See Comments)    Prolonged QT on EKG  . Penicillins Hives  . Sulfonamide Derivatives Hives  . Adhesive [Tape] Other (See Comments) and Rash    redness  . Prednisone Other (See Comments)    Causes insomnia   Current Outpatient Prescriptions on File Prior to Visit  Medication  Sig Dispense Refill  . apixaban (ELIQUIS) 2.5 MG TABS tablet Take 1 tablet (2.5 mg total) by mouth 2 (two) times daily. 60 tablet 11  . benzonatate (TESSALON) 200 MG capsule TAKE ONE CAPSULE BY MOUTH TWICE DAILY ASNEEDED FOR COUGH 180 capsule 2  . Calcium Carb-Cholecalciferol (CALCIUM 600/VITAMIN D3) 600-800 MG-UNIT TABS Take 1 tablet by mouth 2 (two) times daily.    . Cholecalciferol (VITAMIN D-1000 MAX ST) 1000 UNITS tablet Take 1,000 Units by mouth daily.    . diphenhydrAMINE (BENADRYL) 25 mg capsule Take 25 mg by mouth every morning.    . hydrochlorothiazide (HYDRODIURIL) 25 MG tablet Take 1 tablet (25 mg total) by mouth daily. 90 tablet 3  . metoprolol tartrate (LOPRESSOR) 25 MG tablet Take 0.5 tablets (12.5 mg total) by mouth 2 (two) times daily. 90 tablet 3  . Multiple Vitamin (MULTIVITAMIN WITH MINERALS) TABS tablet Take 1 tablet by mouth daily.    . naproxen sodium (ANAPROX) 220 MG tablet Take 220 mg by mouth 2 (two) times daily.     .  pantoprazole (PROTONIX) 40 MG tablet Take 1 tablet (40 mg total) by mouth daily. 30 tablet 11  . polyethylene glycol (MIRALAX / GLYCOLAX) packet Take 17 g by mouth daily.     . naproxen (NAPROSYN) 375 MG tablet Take 1 tablet (375 mg total) by mouth 2 (two) times daily. (Patient not taking: Reported on 01/09/2016) 20 tablet 0   No current facility-administered medications on file prior to visit.    Review of Systems Review of Systems  Constitutional: Negative for fever, appetite change, fatigue and unexpected weight change.  Eyes: Negative for pain and visual disturbance.  Respiratory: Negative for cough and shortness of breath.   Cardiovascular: Negative for cp or palpitations    Gastrointestinal: Negative for nausea, diarrhea and constipation.  Genitourinary: Negative for urgency and frequency.  Skin: Negative for pallor or rash   Neurological: Negative for weakness, light-headedness, numbness and headaches.  Hematological: Negative for adenopathy. Does not bruise/bleed easily.  Psychiatric/Behavioral: Negative for dysphoric mood. The patient is not nervous/anxious.         Objective:   Physical Exam  Constitutional: She appears well-developed and well-nourished. No distress.  Well appearing   HENT:  Head: Normocephalic and atraumatic.  Mouth/Throat: Oropharynx is clear and moist.  Eyes: Conjunctivae and EOM are normal. Pupils are equal, round, and reactive to light.  Neck: Normal range of motion. Neck supple. No JVD present. Carotid bruit is not present. No thyromegaly present.  Cardiovascular: Normal rate, regular rhythm, normal heart sounds and intact distal pulses.  Exam reveals no gallop.   Pulmonary/Chest: Effort normal and breath sounds normal. No respiratory distress. She has no wheezes. She has no rales.  No crackles  Abdominal: Soft. Bowel sounds are normal. She exhibits no distension, no abdominal bruit and no mass. There is no tenderness.  umbilical hernia to L of  umbilicus is unchanged  reducible and nt  Nl bs in 4 Q     Musculoskeletal: She exhibits no edema.  Lymphadenopathy:    She has no cervical adenopathy.  Neurological: She is alert. She has normal reflexes.  Skin: Skin is warm and dry. No rash noted.  Psychiatric: She has a normal mood and affect.          Assessment & Plan:   Problem List Items Addressed This Visit      Cardiovascular and Mediastinum   Essential hypertension    bp has been lower the last  few checks  BP Readings from Last 3 Encounters:  01/09/16 110/60  01/07/16 110/61  01/03/16 108/68   However-no symptoms  Hesitant to change med with her cardiac hx  Will update if it goes lower or she becomes dizzy/orthostatic        Digestive   Constipation - Primary    Noted on abd xray in ED during exac of umbilical hernia  Thought to be the cause (better now)  inst to start daily miralax-I enc her to inc to 1 cap ful daily as tolerated until she is getting regular soft bms  Also fiber/fluids Continue to monitor         Other   Hernia, umbilical    Pt had flare of this -rev ED notes and studies It resolved and became reducible while there Thought to be caused by constipation (seen on abd xr)- will continue to work on that with miralax over the long term  No re occurrence  Stable exam today Pt would like to avoid surgery/ is high risk surgery candidate due to cardio-pulm problems  If control of constipation keeps this stable will continue to monitor  Aware if symptoms return- enl or protuberance of hernia or pain-to seek care asap  Aware to avoid straining as well

## 2016-01-10 NOTE — Assessment & Plan Note (Signed)
Noted on abd xray in ED during exac of umbilical hernia  Thought to be the cause (better now)  inst to start daily miralax-I enc her to inc to 1 cap ful daily as tolerated until she is getting regular soft bms  Also fiber/fluids Continue to monitor

## 2016-01-10 NOTE — Assessment & Plan Note (Signed)
bp has been lower the last few checks  BP Readings from Last 3 Encounters:  01/09/16 110/60  01/07/16 110/61  01/03/16 108/68   However-no symptoms  Hesitant to change med with her cardiac hx  Will update if it goes lower or she becomes dizzy/orthostatic

## 2016-01-10 NOTE — Assessment & Plan Note (Signed)
Pt had flare of this -rev ED notes and studies It resolved and became reducible while there Thought to be caused by constipation (seen on abd xr)- will continue to work on that with miralax over the long term  No re occurrence  Stable exam today Pt would like to avoid surgery/ is high risk surgery candidate due to cardio-pulm problems  If control of constipation keeps this stable will continue to monitor  Aware if symptoms return- enl or protuberance of hernia or pain-to seek care asap  Aware to avoid straining as well

## 2016-01-15 ENCOUNTER — Encounter: Payer: Self-pay | Admitting: Pulmonary Disease

## 2016-01-15 ENCOUNTER — Ambulatory Visit (INDEPENDENT_AMBULATORY_CARE_PROVIDER_SITE_OTHER): Payer: Medicare Other | Admitting: Pulmonary Disease

## 2016-01-15 VITALS — BP 102/62 | HR 71 | Ht 68.0 in | Wt 198.0 lb

## 2016-01-15 DIAGNOSIS — G4733 Obstructive sleep apnea (adult) (pediatric): Secondary | ICD-10-CM

## 2016-01-15 DIAGNOSIS — J9611 Chronic respiratory failure with hypoxia: Secondary | ICD-10-CM | POA: Diagnosis not present

## 2016-01-15 NOTE — Assessment & Plan Note (Signed)
Use 2L O2 on exertion & sleep - Ok to stay off at rest

## 2016-01-15 NOTE — Progress Notes (Signed)
   Subjective:    Patient ID: [name], female    DOB: [birth year], 82 y.o.   MRN: [id number]  HPI  82 year old woman with pulmonary hypertension, OSA and Chronic hypoxic respiratory failure on O2 (w/ CPAP) , PE , Cor Pulmonale and PSVT /A-Fib s/p Ablation 02/2015   She had a complicated course after left knee replacement in 2013 due to a DVT/PE. Tx w/ anticoagulation >1 year, Off Xarelto Dec 2015.      01/15/2016  Chief Complaint  Patient presents with  . Follow-up    Patient has been sick, she has not been wearing CPAP because it is very uncomfortable.  She says she has been stufffy.   She had the crud & stopped using CPAP Download confirms -  12 cm effective,no residuals O2 ran out - satn 91% on RA -she is not using oxygen all the time Uses cane to ambulate On long term oral Abx for knee  ED visit for abd pain - CT abd ok   She has hx of Atrial Fib and PSVT w/ recent ablation in 02/2015 Followed by Dr. Rayann Heman. She remains on Eliquis for this.    She ambulates with a walker for short distances  Significant tests/ events  RHC (06/2014)  RA mean 6  PA 43/12, mean 25  PCWP mean 16  PVR 1.6 WU  CI 2.9   PFTs (05/25/14) FEV1 1.88 L (88%) FVC 2.22 L (77%) ratio 114, DLCO 68%    CT chest 02/2014 - no PE , mild bronchiectasis at bases, Areas of mild dependent subsegmental atelectasis throughout the lower lobes of the lungs bilaterally VQ 05/30/14: Low prob   PSG 06/2014 >> severe OSA, AHI 62 per hour, corrected by CPAP 12 cm with small fullface mask. Central apneas emergent higher pressures  ONO on CPPA/RA shows 71 min desatn -Add 2L O2 to CPAP ONO on CPAP/2 L O2 >> no desatn  Echo 02/12/15 demonstrated EF 55%, mild MR, moderate TR, PA pressure 57, RV severely dilated, small pericardial effusion, LA 37.   Review of Systems Patient denies significant dyspnea,cough, hemoptysis,  chest pain, palpitations, pedal edema, orthopnea, paroxysmal nocturnal dyspnea,  lightheadedness, nausea, vomiting, abdominal or  leg pains       Objective:   Physical Exam  Gen. Pleasant, well-nourished, in no distress ENT - no lesions, no post nasal drip Neck: No JVD, no thyromegaly, no carotid bruits Lungs: no use of accessory muscles, no dullness to percussion, clear without rales or rhonchi  Cardiovascular: Rhythm regular, heart sounds  normal, no murmurs or gallops, no peripheral edema Musculoskeletal: No deformities, no cyanosis or clubbing        Assessment & Plan:

## 2016-01-15 NOTE — Assessment & Plan Note (Signed)
Get back on CPAP machine with oxygen blended in   Weight loss encouraged, compliance with goal of at least 4-6 hrs every night is the expectation. Advised against medications with sedative side effects Cautioned against driving when sleepy - understanding that sleepiness will vary on a day to day basis

## 2016-01-15 NOTE — Patient Instructions (Signed)
Get back on CPAP machine with oxygen blended in Use 2L O2 on exertion & sleep

## 2016-01-23 ENCOUNTER — Telehealth: Payer: Self-pay

## 2016-01-23 ENCOUNTER — Emergency Department (HOSPITAL_COMMUNITY)
Admission: EM | Admit: 2016-01-23 | Discharge: 2016-01-23 | Disposition: A | Discharge status: Home / Self Care | Payer: Medicare Other | Attending: Emergency Medicine | Admitting: Emergency Medicine

## 2016-01-23 ENCOUNTER — Emergency Department (HOSPITAL_BASED_OUTPATIENT_CLINIC_OR_DEPARTMENT_OTHER)
Admit: 2016-01-23 | Discharge: 2016-01-23 | Disposition: A | Discharge status: Home / Self Care | Payer: Medicare Other | Attending: Emergency Medicine | Admitting: Emergency Medicine

## 2016-01-23 ENCOUNTER — Encounter (HOSPITAL_COMMUNITY): Payer: Self-pay | Admitting: Emergency Medicine

## 2016-01-23 ENCOUNTER — Telehealth: Payer: Self-pay | Admitting: Family Medicine

## 2016-01-23 DIAGNOSIS — Z86711 Personal history of pulmonary embolism: Secondary | ICD-10-CM | POA: Diagnosis not present

## 2016-01-23 DIAGNOSIS — M25562 Pain in left knee: Secondary | ICD-10-CM | POA: Diagnosis present

## 2016-01-23 DIAGNOSIS — E785 Hyperlipidemia, unspecified: Secondary | ICD-10-CM | POA: Diagnosis not present

## 2016-01-23 DIAGNOSIS — Z880 Allergy status to penicillin: Secondary | ICD-10-CM | POA: Insufficient documentation

## 2016-01-23 DIAGNOSIS — Z86010 Personal history of colonic polyps: Secondary | ICD-10-CM | POA: Insufficient documentation

## 2016-01-23 DIAGNOSIS — M7989 Other specified soft tissue disorders: Secondary | ICD-10-CM | POA: Diagnosis not present

## 2016-01-23 DIAGNOSIS — M79609 Pain in unspecified limb: Secondary | ICD-10-CM | POA: Diagnosis not present

## 2016-01-23 DIAGNOSIS — M2540 Effusion, unspecified joint: Secondary | ICD-10-CM | POA: Insufficient documentation

## 2016-01-23 DIAGNOSIS — L03116 Cellulitis of left lower limb: Secondary | ICD-10-CM | POA: Insufficient documentation

## 2016-01-23 DIAGNOSIS — Z9981 Dependence on supplemental oxygen: Secondary | ICD-10-CM | POA: Diagnosis not present

## 2016-01-23 DIAGNOSIS — I10 Essential (primary) hypertension: Secondary | ICD-10-CM | POA: Insufficient documentation

## 2016-01-23 DIAGNOSIS — Z862 Personal history of diseases of the blood and blood-forming organs and certain disorders involving the immune mechanism: Secondary | ICD-10-CM | POA: Diagnosis not present

## 2016-01-23 DIAGNOSIS — Z79899 Other long term (current) drug therapy: Secondary | ICD-10-CM | POA: Diagnosis not present

## 2016-01-23 DIAGNOSIS — G4733 Obstructive sleep apnea (adult) (pediatric): Secondary | ICD-10-CM | POA: Diagnosis not present

## 2016-01-23 LAB — BASIC METABOLIC PANEL
ANION GAP: 10 (ref 5–15)
BUN: 15 mg/dL (ref 6–20)
CHLORIDE: 99 mmol/L — AB (ref 101–111)
CO2: 31 mmol/L (ref 22–32)
Calcium: 9.3 mg/dL (ref 8.9–10.3)
Creatinine, Ser: 0.98 mg/dL (ref 0.44–1.00)
GFR calc non Af Amer: 53 mL/min — ABNORMAL LOW (ref >60)
GLUCOSE: 94 mg/dL (ref 65–99)
POTASSIUM: 3.4 mmol/L — AB (ref 3.5–5.1)
SODIUM: 140 mmol/L (ref 135–145)

## 2016-01-23 LAB — CBC WITH DIFFERENTIAL/PLATELET
BASOS PCT: 0 %
Basophils Absolute: 0 10*3/uL (ref 0.0–0.1)
EOS ABS: 0.2 10*3/uL (ref 0.0–0.7)
Eosinophils Relative: 2 %
HCT: 41.3 % (ref 36.0–46.0)
HEMOGLOBIN: 13.5 g/dL (ref 12.0–15.0)
LYMPHS ABS: 1.3 10*3/uL (ref 0.7–4.0)
Lymphocytes Relative: 15 %
MCH: 30.3 pg (ref 26.0–34.0)
MCHC: 32.7 g/dL (ref 30.0–36.0)
MCV: 92.6 fL (ref 78.0–100.0)
Monocytes Absolute: 1.1 10*3/uL — ABNORMAL HIGH (ref 0.1–1.0)
Monocytes Relative: 13 %
NEUTROS PCT: 70 %
Neutro Abs: 5.7 10*3/uL (ref 1.7–7.7)
Platelets: 283 10*3/uL (ref 150–400)
RBC: 4.46 MIL/uL (ref 3.87–5.11)
RDW: 14.5 % (ref 11.5–15.5)
WBC: 8.2 10*3/uL (ref 4.0–10.5)

## 2016-01-23 LAB — GRAM STAIN

## 2016-01-23 MED ORDER — LIDOCAINE-EPINEPHRINE (PF) 2 %-1:200000 IJ SOLN
20.0000 mL | Freq: Once | INTRAMUSCULAR | Status: DC
  Filled 2016-01-23: qty 20

## 2016-01-23 MED ORDER — CLINDAMYCIN HCL 150 MG PO CAPS: 300.0000 mg | ORAL_CAPSULE | Freq: Three times a day (TID) | ORAL | Status: DC

## 2016-01-23 NOTE — Telephone Encounter (Signed)
Looks like she is in the ED

## 2016-01-23 NOTE — ED Provider Notes (Addendum)
Patient seen and evaluated. Discussed with Lorre Munroe PA-C. 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 Followed to [facility]. Her previous surgeon had been Dr. Al Corpus, who has moved away. Now followed by the clinic service there. Most recently seen by Dr. fields in clinic last week.  Has undergone multiple washout procedures no limitation of beads. Family states that she is on antibiotics. They also state that she has been told that very likely her chronic infection would result in it for amputation. She is hoping to avoid that.  Had progressive redness and now some soft tissue swelling and "knot" on the posterior middle aspect of her left knee. She states it is always uncomfortable and painful. He is only swollen. However this appeared new to her daughter so they present here.  On exam she has an area of erythema and cellulitic change of the lateral aspect of the knee from the lateral patellar edge to the posterior medial joint line. She has some erythema and induration. Also has an area of superficial palpable fluctuance.  Has had over some myself shows immediate subcutaneous tissue fluid collection, no apparent overlying Baker cysts. She underwent formal ultrasound which confirms these findings. Patient underwent incision and drainage by myself to determine fluid likely synovial fluid from the most superficial collection. This was without difficulty.  I discussed with the patient family that this is very likely a progression of her disease with extension of her Baker's cyst to the subcutaneous fluid collection. Culture was requested. I discussed the case with Dr. Oneida Alar. He recommended widening and changing her antibiotic therapy to doxycycline. He asked that she be seen in follow-up next week to discuss definitive care. This may likely resulted discussion of amputation.   Apiration of blood/fluid Performed by: Lolita Patella Consent obtained. Required  items: required blood products, implants, devices, and special equipment available Patient identity confirmed: verbally with patient Time out: Immediately prior to procedure a "time out" was called to verify the correct patient, procedure, equipment, support staff and site/side marked as required. Preparation: Patient was prepped and draped in the usual sterile fashion. Patient tolerance: Patient tolerated the procedure well with no immediate complications.  Location of aspiration: Left posterior knee/popliteal fossa     Tanna Furry, MD 01/23/16 1534  Tanna Furry, MD 02/01/16 0010

## 2016-01-23 NOTE — ED Notes (Signed)
Pt reports that she noticed a knot on the back of her left knee which she is concerned is a blood clot so she referred here by her PCP. Pt alert x4. NAD at this time.

## 2016-01-23 NOTE — Telephone Encounter (Signed)
[name] DOB: 1934-10-06 Initial Comment Caller states her mother has hard raised vein behind knee Nurse Assessment Nurse: Marcelline Deist, RN, Lynda Date/Time (Eastern Time): 01/23/2016 9:03:08 AM Confirm and document reason for call. If symptomatic, describe symptoms. You must click the next button to save text entered. ---Caller states her mother has hard raised vein behind left knee, goes side to side, redness & painful to touch. Had surgery on that knee last year. The area is warm to touch. Has the patient traveled out of the country within the last 30 days? ---Not Applicable Does the patient have any new or worsening symptoms? ---Yes Will a triage be completed? ---Yes Related visit to physician within the last 2 weeks? ---No Does the PT have any chronic conditions? (i.e. diabetes, asthma, etc.) ---Yes List chronic conditions. ---blood clot hx, knee surgery, A fib, on blood thinner, on BP rx Is this a behavioral health or substance abuse call? ---No Guidelines Guideline Title Affirmed Question Affirmed Notes Leg Pain [1] Red area or streak AND [2] large (> 2 in. or 5 cm) Final Disposition User See Physician within 4 Hours (or PCP triage) Marcelline Deist, RN, Lynda Comments No availability at office or at [facility], so advised having this checked at [facility] or hospital. Caller states the red area behind the knee is 4 fingers wide & 2 fingers long. Referrals [facility] - ED Disagree/Comply: Comply

## 2016-01-23 NOTE — Discharge Instructions (Signed)
Call your orthopedic doctor for an appointment next week.  Take antibiotics as directed.  Consider taking a probiotic while taking the antibiotics.  Cellulitis Cellulitis is an infection of the skin and the tissue beneath it. The infected area is usually red and tender. Cellulitis occurs most often in the arms and lower legs.  CAUSES  Cellulitis is caused by bacteria that enter the skin through cracks or cuts in the skin. The most common types of bacteria that cause cellulitis are staphylococci and streptococci. SIGNS AND SYMPTOMS   Redness and warmth.  Swelling.  Tenderness or pain.  Fever. DIAGNOSIS  Your health care provider can usually determine what is wrong based on a physical exam. Blood tests may also be done. TREATMENT  Treatment usually involves taking an antibiotic medicine. HOME CARE INSTRUCTIONS   Take your antibiotic medicine as directed by your health care provider. Finish the antibiotic even if you start to feel better.  Keep the infected arm or leg elevated to reduce swelling.  Apply a warm cloth to the affected area up to 4 times per day to relieve pain.  Take medicines only as directed by your health care provider.  Keep all follow-up visits as directed by your health care provider. SEEK MEDICAL CARE IF:   You notice red streaks coming from the infected area.  Your red area gets larger or turns dark in color.  Your bone or joint underneath the infected area becomes painful after the skin has healed.  Your infection returns in the same area or another area.  You notice a swollen bump in the infected area.  You develop new symptoms.  You have a fever. SEEK IMMEDIATE MEDICAL CARE IF:   You feel very sleepy.  You develop vomiting or diarrhea.  You have a general ill feeling (malaise) with muscle aches and pains.   This information is not intended to replace advice given to you by your health care provider. Make sure you discuss any questions you  have with your health care provider.   Document Released: 07/23/2005 Document Revised: 07/04/2015 Document Reviewed: 12/29/2011 Elsevier Interactive Patient Education 2016 Elsevier Inc[employer] Knee Effusion Knee effusion means that you have excess fluid in your knee joint. This can cause pain and swelling in your knee. This may make your knee more difficult to bend and move. That is because there is increased pain and pressure in the joint. If there is fluid in your knee, it often means that something is wrong inside your knee, such as severe arthritis, abnormal inflammation, or an infection. Another common cause of knee effusion is an injury to the knee muscles, ligaments, or cartilage. HOME CARE INSTRUCTIONS  Use crutches as directed by your health care provider.  Wear a knee brace as directed by your health care provider.  Apply ice to the swollen area:  Put ice in a plastic bag.  Place a towel between your skin and the bag.  Leave the ice on for 20 minutes, 2-3 times per day.  Keep your knee raised (elevated) when you are sitting or lying down.  Take medicines only as directed by your health care provider.  Do any rehabilitation or strengthening exercises as directed by your health care provider.  Rest your knee as directed by your health care provider. You may start doing your normal activities again when your health care provider approves.   Keep all follow-up visits as directed by your health care provider. This is important. SEEK MEDICAL CARE IF:  You  have ongoing (persistent) pain in your knee. SEEK IMMEDIATE MEDICAL CARE IF:  You have increased swelling or redness of your knee.  You have severe pain in your knee.  You have a fever.   This information is not intended to replace advice given to you by your health care provider. Make sure you discuss any questions you have with your health care provider.   Document Released: 01/03/2004 Document Revised: 11/03/2014  Document Reviewed: 05/29/2014 Elsevier Interactive Patient Education [employer].

## 2016-01-23 NOTE — Progress Notes (Signed)
VASCULAR LAB PRELIMINARY  PRELIMINARY  PRELIMINARY  PRELIMINARY  Left lower extremity venous duplex completed.    Preliminary report:  Left:  No evidence of DVT or superficial thrombosis.  There is an area of mixed echoes in the popliteal fossa.  Complicated Baker's cyst vs abscess.  Yaviel Kloster, RVT 01/23/2016, 1:46 PM

## 2016-01-23 NOTE — ED Provider Notes (Signed)
CSN: [id number]     Arrival date & time 01/23/16  1030 History   First MD Initiated Contact with Patient 01/23/16 1157     Chief Complaint  Patient presents with  . Leg Swelling     (Consider location/radiation/quality/duration/timing/severity/associated sxs/prior Treatment) HPI Comments: Patient presents to the emergency department with chief complaint of "not on the left knee." She states that she has had multiple surgeries on her left knee. States that it is chronically infected, and she takes Keflex for this. Her daughter noticed a bump on the back for knee, and advised her to come to the emergency department for further evaluation. She denies any fevers chills.  She denies associated pain or new stiffness.  She is able to ambulate.  The history is provided by the patient. No language interpreter was used.    Past Medical History  Diagnosis Date  . Osteopenia   . Hyperlipidemia   . Hypertension   . PSVT (paroxysmal supraventricular tachycardia) ([place])   . Spinal stenosis   . Overactive bladder     urge incontinence  . Pulmonary emboli ([place])   . Pulmonary hypertension ([place])   . Right ventricular failure ([place])   . Arthritis     knee  . Anemia   . Diverticulosis   . Sleep apnea     CPAP  . Hx of colonic polyps 10/24/2014   Past Surgical History  Procedure Laterality Date  . Cataract extraction Bilateral 2008  . Bladder suspension  1993  . Total knee arthroplasty Left 2000  . Total knee revision  01/12/2012    Procedure: TOTAL KNEE REVISION;  Surgeon: Kerin Salen, MD;  Location: [facility];  Service: Orthopedics;  Laterality: Left;  DEPUY/ LCS , HAND SET  . Trigger finger release  01/12/2012    Procedure: RELEASE TRIGGER FINGER/A-1 PULLEY;  Surgeon: Kerin Salen, MD;  Location: [facility];  Service: Orthopedics;  Laterality: Right;  . Cholecystectomy    . Incisional hernia repair    . Total knee arthroplasty Left 10/29/2012    at [facility]  . Total knee arthroplasty Right 03/09/2014  .  Colonoscopy    . Right heart catheterization N/A 07/04/2014    Procedure: RIGHT HEART CATH;  Surgeon: Jolaine Artist, MD;  Location: [facility] CATH LAB;  Service: Cardiovascular;  Laterality: N/A;  . Tonsillectomy and adenoidectomy    . Colonoscopy N/A 10/24/2014    Procedure: COLONOSCOPY;  Surgeon: Gatha Mayer, MD;  Location: [facility] ENDOSCOPY;  Service: Endoscopy;  Laterality: N/A;  . Cardiac catheterization N/A 02/28/2015    Procedure: Right/Left Heart Cath and Coronary Angiography;  Surgeon: Troy Sine, MD;  Location: [place] INVASIVE CV LAB CUPID;  Service: Cardiovascular;  Laterality: N/A;  . Electrophysiologic study N/A 03/02/2015    Procedure: SVT Ablation;  Surgeon: Evans Lance, MD;  Location: [place] CV LAB;  Service: Cardiovascular;  Laterality: N/A;   Family History  Problem Relation Age of Onset  . Breast cancer Mother   . Heart disease Father   . Diabetes Father   . Hypertension Father   . Uterine cancer Sister    Social History  Substance Use Topics  . Smoking status: Never Smoker   . Smokeless tobacco: Never Used  . Alcohol Use: No   OB History    No data available     Review of Systems  Constitutional: Negative for fever and chills.  Respiratory: Negative for shortness of breath.   Cardiovascular: Negative for chest pain.  Gastrointestinal: Negative for nausea, vomiting, diarrhea and constipation.  Genitourinary: Negative for dysuria.  Musculoskeletal: Positive for arthralgias.  Skin: Positive for color change.  All other systems reviewed and are negative.     Allergies  Zithromax; Penicillins; Sulfonamide derivatives; Adhesive; and Prednisone  Home Medications   Prior to Admission medications   Medication Sig Start Date End Date Taking? Authorizing Provider  apixaban (ELIQUIS) 2.5 MG TABS tablet Take 1 tablet (2.5 mg total) by mouth 2 (two) times daily. 12/03/15   Thompson Grayer, MD  benzonatate (TESSALON) 200 MG capsule TAKE ONE CAPSULE BY MOUTH TWICE DAILY  ASNEEDED FOR COUGH 05/07/15   Abner Greenspan, MD  Calcium Carb-Cholecalciferol (CALCIUM 600/VITAMIN D3) 600-800 MG-UNIT TABS Take 1 tablet by mouth 2 (two) times daily.    Historical Provider, MD  cephALEXin (KEFLEX) 500 MG capsule Take 500 mg by mouth 3 (three) times daily.    Historical Provider, MD  Cholecalciferol (VITAMIN D-1000 MAX ST) 1000 UNITS tablet Take 1,000 Units by mouth daily. 09/08/15   Historical Provider, MD  diphenhydrAMINE (BENADRYL) 25 mg capsule Take 25 mg by mouth every morning.    Historical Provider, MD  hydrochlorothiazide (HYDRODIURIL) 25 MG tablet Take 1 tablet (25 mg total) by mouth daily. 10/26/15   Abner Greenspan, MD  metoprolol tartrate (LOPRESSOR) 25 MG tablet Take 0.5 tablets (12.5 mg total) by mouth 2 (two) times daily. 12/03/15   Thompson Grayer, MD  Multiple Vitamin (MULTIVITAMIN WITH MINERALS) TABS tablet Take 1 tablet by mouth daily.    Historical Provider, MD  naproxen (NAPROSYN) 375 MG tablet Take 1 tablet (375 mg total) by mouth 2 (two) times daily. 01/07/16   April Palumbo, MD  naproxen sodium (ANAPROX) 220 MG tablet Take 220 mg by mouth 2 (two) times daily.     Historical Provider, MD  pantoprazole (PROTONIX) 40 MG tablet Take 1 tablet (40 mg total) by mouth daily. 09/26/15   Abner Greenspan, MD  polyethylene glycol (MIRALAX / GLYCOLAX) packet Take 17 g by mouth daily.     Historical Provider, MD   BP 103/73 mmHg  Pulse 69  Temp(Src) 97.9 F (36.6 C) (Oral)  Resp 18  SpO2 96% Physical Exam  Constitutional: She is oriented to person, place, and time. She appears well-developed and well-nourished.  HENT:  Head: Normocephalic and atraumatic.  Eyes: Conjunctivae and EOM are normal. Pupils are equal, round, and reactive to light.  Neck: Normal range of motion. Neck supple.  Cardiovascular: Normal rate and regular rhythm.  Exam reveals no gallop and no friction rub.   No murmur heard. Pulmonary/Chest: Effort normal and breath sounds normal. No respiratory  distress. She has no wheezes. She has no rales. She exhibits no tenderness.  Abdominal: Soft. Bowel sounds are normal. She exhibits no distension and no mass. There is no tenderness. There is no rebound and no guarding.  Musculoskeletal: Normal range of motion. She exhibits no edema or tenderness.  Erythema and induration in left popliteal, non-tender, no discharge, able to range knee easily  Neurological: She is alert and oriented to person, place, and time.  Skin: Skin is warm and dry.  Psychiatric: She has a normal mood and affect. Her behavior is normal. Judgment and thought content normal.  Nursing note and vitals reviewed.   ED Course  Procedures (including critical care time) Labs Review Labs Reviewed  CBC WITH DIFFERENTIAL/PLATELET - Abnormal; Notable for the following:    Monocytes Absolute 1.1 (*)    All other components within  normal limits  BASIC METABOLIC PANEL - Abnormal; Notable for the following:    Potassium 3.4 (*)    Chloride 99 (*)    GFR calc non Af Amer 53 (*)    All other components within normal limits  BODY FLUID CULTURE    I have personally reviewed and evaluated these images and lab results as part of my medical decision-making.   MDM   Final diagnoses:  Cellulitis of knee, left  Joint effusion    Patient with moderately erythematous swollen area in left popliteal. Doubt abscess, but this remains on the differential. Consider Baker's cyst and DVT. Will discuss with Dr. Jeneen Rinks.  Patient seen by and discussed with Dr. Jeneen Rinks, who recommends Doppler DVT study. If Baker's cyst, recommend orthopedic follow-up. If questionable, recommends shallow aspiration of the indurated area, obtaining cultures of this, increasing abx strength, and getting orthopedic follow-up.  DVT study is negative, question Baker's cyst versus abscess. The area was aspirated by Dr. Jeneen Rinks, and we sent the body fluid for culture. Dr. Jeneen Rinks discussed patient with the patient's orthopedic  surgeon in [place], who recommends starting clindamycin and will see the patient next week. With recurrent infections in this left knee and multiple prior procedures, it is likely that the patient will require a future amputation. Patient is well-appearing now, and nontoxic. She is stable for discharge with outpatient follow-up. Patient seen by and discussed with Dr. Jeneen Rinks.    Montine Circle, PA-C 01/23/16 1554  Tanna Furry, MD 01/26/16 1001

## 2016-01-23 NOTE — Telephone Encounter (Signed)
Megan, nurse with [facility] wants to speak to someone in reference to recent pt's appt, f/u OV if scheduled and BP readings--please call [facility]

## 2016-01-23 NOTE — Telephone Encounter (Signed)
Called # left on message and a rep xfered me to [employer]. Left message requesting her to call office back

## 2016-01-24 NOTE — Telephone Encounter (Signed)
[name] requested pt's last BP and if she has had a recent AWV or has one scheduled, I gave [name] that Info

## 2016-01-25 ENCOUNTER — Other Ambulatory Visit: Payer: Self-pay | Admitting: Internal Medicine

## 2016-01-25 ENCOUNTER — Other Ambulatory Visit: Payer: Self-pay | Admitting: Family Medicine

## 2016-01-28 LAB — CULTURE, BODY FLUID W GRAM STAIN -BOTTLE: Culture: NO GROWTH

## 2016-01-28 LAB — CULTURE, BODY FLUID-BOTTLE

## 2016-01-28 NOTE — Telephone Encounter (Signed)
Spoke with pt and daughter who advise me that pt has had a bad cough and congestion for a few months. You have seen pt for it as well as other providers. Daughter said that pt had been coughing up yellowish brown phlegm, pt said she has no other sxs, no fever, no body aches just a cough and congestion,  Daughter also advise me that she sees ID for a baker's cyst and that she is on an abx now due to it looking infected

## 2016-01-28 NOTE — Telephone Encounter (Signed)
Px printed for pick up in IN box  Follow up if cough worsens once you have finished tx for the cyst

## 2016-01-28 NOTE — Telephone Encounter (Signed)
Please ask why she is requesting this and what her symptoms are

## 2016-01-28 NOTE — Telephone Encounter (Signed)
Last time Rx was prescribed was 11/19/15 and not on current med list, please advise

## 2016-01-28 NOTE — Telephone Encounter (Signed)
Pt notified Rx ready for pick-up and advise of Dr. Marliss Coots comments and verbalized understanding

## 2016-01-29 ENCOUNTER — Encounter: Payer: Self-pay | Admitting: Pulmonary Disease

## 2016-02-06 ENCOUNTER — Telehealth: Payer: Self-pay | Admitting: Pulmonary Disease

## 2016-02-06 ENCOUNTER — Ambulatory Visit (INDEPENDENT_AMBULATORY_CARE_PROVIDER_SITE_OTHER): Payer: Medicare Other | Admitting: Allergy and Immunology

## 2016-02-06 ENCOUNTER — Encounter: Payer: Self-pay | Admitting: Allergy and Immunology

## 2016-02-06 VITALS — BP 112/68 | HR 60 | Temp 97.6°F | Resp 16 | Ht 65.75 in | Wt 200.8 lb

## 2016-02-06 DIAGNOSIS — T360X5D Adverse effect of penicillins, subsequent encounter: Secondary | ICD-10-CM

## 2016-02-06 NOTE — Patient Instructions (Signed)
  Signed medical release for notes from infectious disease, M.D.  Schedule penicillin testing appointment.  Details regarding oral challenge after testing--once receive up-to-date from ID MD.  Further recommendations made Follow-up.

## 2016-02-06 NOTE — Progress Notes (Signed)
NEW PATIENT NOTE  RE: [name] MRN: [id number] DOB: [birth year] [facility] [place] [address] [place] [id number] Date of Office Visit: 02/06/2016  Dear Abner Greenspan, MD:  I had the pleasure of seeing [name], accompanied by her daughter today in initial evaluation, as you recall-- Subjective:  [name] is a 82 y.o. female who presents today for New Patient (Initial Visit)--regarding penicillin allergy.  Assessment:   1. Penicillin adverse reaction, subsequent encounter---Patient report of urticaria associated with penicillin administration 50 years ago.    2.      Left knee prosthetic joint infection--- as followed by infectious disease, apparent methicillin sensitive staph aureus. 3.      Complex medical history with multiple medication regime. 4.         Plan:  1.  Signed medical release for notes from infectious disease, M.D. 2.  Schedule penicillin testing appointment. 3.  Details regarding oral challenge after testing--once receive up-to-date from ID MD. 4.  Further recommendations made at follow-up.  HPI: [name], presents with her daughter, given concerns, since the 72s of penicillin allergy--after IM penicillin-related hives in treatment for ovarian cyst.  She reports a complex medical history, in particular, most recently left knee prosthetic joint infection (recurring left knee difficulty--repeat surgical procedures) which did not seem responsive to Keflex or clindamycin.  Her daughter understands the culture showed staph aureus and the infectious disease physician wants to use penicillin for management and therefore has requested penicillin testing. Kalicia also reports a new Baker's cyst at that left knee.  She denies fever or other acute recurring concerns nor any chronic history of upper or lower respiratory environmental or other allergic difficulties. There is no chronic history of skin concerns, eczema or recurring hives.  No  history of acute reactions or any food sensitivities.   Medical History: Past Medical History  Diagnosis Date  . Osteopenia   . Hyperlipidemia   . Hypertension   . PSVT (paroxysmal supraventricular tachycardia) ([place])   . Spinal stenosis   . Overactive bladder     urge incontinence  . Pulmonary emboli ([place])   . Pulmonary hypertension ([place])   . Right ventricular failure ([place])   . Arthritis     knee  . Anemia   . Diverticulosis   . Sleep apnea     CPAP  . Hx of colonic polyps 10/24/2014   Surgical History: Past Surgical History  Procedure Laterality Date  . Cataract extraction Bilateral 2008  . Bladder suspension  1993  . Total knee arthroplasty Left 2000  . Total knee revision  01/12/2012    Procedure: TOTAL KNEE REVISION;  Surgeon: Kerin Salen, MD;  Location: [facility];  Service: Orthopedics;  Laterality: Left;  DEPUY/ LCS , HAND SET  . Trigger finger release  01/12/2012    Procedure: RELEASE TRIGGER FINGER/A-1 PULLEY;  Surgeon: Kerin Salen, MD;  Location: [facility];  Service: Orthopedics;  Laterality: Right;  . Cholecystectomy    . Incisional hernia repair    . Total knee arthroplasty Left 10/29/2012    at [facility]  . Total knee arthroplasty Right 03/09/2014  . Colonoscopy    . Right heart catheterization N/A 07/04/2014    Procedure: RIGHT HEART CATH;  Surgeon: Jolaine Artist, MD;  Location: [facility] CATH LAB;  Service: Cardiovascular;  Laterality: N/A;  . Tonsillectomy and adenoidectomy    . Colonoscopy N/A 10/24/2014    Procedure: COLONOSCOPY;  Surgeon: Gatha Mayer, MD;  Location: [facility];  Service: Endoscopy;  Laterality: N/A;  . Cardiac catheterization N/A 02/28/2015    Procedure: Right/Left Heart Cath and Coronary Angiography;  Surgeon: Troy Sine, MD;  Location: [facility] INVASIVE CV LAB CUPID;  Service: Cardiovascular;  Laterality: N/A;  . Electrophysiologic study N/A 03/02/2015    Procedure: SVT Ablation;  Surgeon: Evans Lance, MD;  Location: [facility] CV LAB;  Service:  Cardiovascular;  Laterality: N/A;   Family History: Family History  Problem Relation Age of Onset  . Breast cancer Mother   . Heart disease Father   . Diabetes Father   . Hypertension Father   . Uterine cancer Sister    Social History: Social History  . Marital Status: Widowed    Spouse Name: N/A  . Number of Children: 3  . Years of Education: N/A   Occupational History  . Retired    Social History Main Topics  . Smoking status: Never Smoker   . Smokeless tobacco: Never Used  . Alcohol Use: No  . Drug Use: No  . Sexual Activity: No   Social History Narrative   Pt lives alone but dtr lives on property and has been staying with her since surgery.   Germani has a current medication list which includes the following prescription(s): apixaban, benzonatate, calcium carb-cholecalciferol, cholecalciferol, dicloxacillin, diphenhydramine, hydrochlorothiazide, metoprolol tartrate, multivitamin with minerals, naproxen sodium, pantoprazole, polyethylene glycol.   Drug Allergies: Allergies  Allergen Reactions  . Zithromax [Azithromycin] Other (See Comments)    Prolonged QT on EKG  . Penicillins Hives  . Sulfonamide Derivatives Hives  . Adhesive [Tape] Other (See Comments) and Rash    redness  . Prednisone Other (See Comments)    Causes insomnia   Environmental History: Kimothy lives in a 80 year old house since built with hardwood floors, with central heat and air; stuffed mattress, non-feather pillow/comforter, indoor cat without humidifier or smokers.   Review of Systems  Constitutional: Negative for fever, weight loss and malaise/fatigue.  HENT: Positive for congestion. Negative for ear pain, hearing loss, nosebleeds and sore throat.   Eyes: Negative for discharge and redness.  Respiratory: Negative for shortness of breath.        Denies history of bronchitis and pneumonia.  Gastrointestinal: Negative for heartburn, nausea, vomiting, abdominal pain, diarrhea and constipation.    Genitourinary: Negative.   Musculoskeletal: Negative for myalgias.       Left knee pain as indicated in history of present illness.    Skin: Negative.  Negative for itching and rash.  Neurological: Negative.  Negative for dizziness, seizures, weakness and headaches.  Endo/Heme/Allergies: Positive for environmental allergies.       Denies sensitivity to aspirin, NSAIDs, stinging insects, foods, latex, jewelry and cosmetics.  Immunological: No chronic or recurring infections. Objective:   Filed Vitals:   02/06/16 1429  BP: 112/68  Pulse: 60  Temp: 97.6 F (36.4 C)  Resp: 16   Physical Exam  Constitutional: She is well-developed, well-nourished, and in no distress.  HENT:  Head: Atraumatic.  Right Ear: Tympanic membrane and ear canal normal.  Left Ear: Tympanic membrane and ear canal normal.  Nose: Mucosal edema present. No rhinorrhea. No epistaxis.  Mouth/Throat: Oropharynx is clear and moist and mucous membranes are normal. No oropharyngeal exudate, posterior oropharyngeal edema or posterior oropharyngeal erythema.  Eyes: Conjunctivae are normal.  Neck: Neck supple.  Cardiovascular: Normal rate, S1 normal and S2 normal.   No murmur heard. Pulmonary/Chest: Effort normal. She has no wheezes. She has  no rhonchi. She has no rales.  Abdominal: Soft. Normal appearance and bowel sounds are normal.  Musculoskeletal: She exhibits no edema.  Noted swelling at left knee with erythematous cyst area at popliteal fossa, no drainage.  (Well-healed surgical incision lines at both knees).  Lymphadenopathy:    She has no cervical adenopathy.  Neurological: She is alert.  Skin: Skin is warm and intact. No rash noted. No cyanosis. Nails show no clubbing.   Diagnostics:    Skin testing:  Deferred.    Roselyn M. Ishmael Holter, MD   cc: Loura Pardon, MD  cc: Fransisca Kaufmann, MD

## 2016-02-06 NOTE — Telephone Encounter (Signed)
Left message for [name] to return call.

## 2016-02-07 NOTE — Telephone Encounter (Signed)
lmtcb for [name].  

## 2016-02-07 NOTE — Telephone Encounter (Signed)
Called and spoke to pt's caregiver, [name]. She states [facility] is needing a letter stating pt is using her CPAP in order for pt to get new supplies. Called [facility] and spoke to [facility] and was advised that pt will need to come in for another OV because the last time pt was seen pt was not using CPAP. Appt made with TP for 4/24. This appt is just needing to verify pt is wearing CPAP regularly and still needs to continue wearing it. Once OV with TP has been completed a staff message needs to be sent to [facility] so she is able to send OV notes to insurance for pt to get new supplies. Nothing further needed at this time.   Will send to [facility] as FYI.

## 2016-02-07 NOTE — Telephone Encounter (Signed)
[name] returning call, CB [phone]

## 2016-02-12 ENCOUNTER — Telehealth: Payer: Self-pay | Admitting: Family Medicine

## 2016-02-12 ENCOUNTER — Ambulatory Visit (INDEPENDENT_AMBULATORY_CARE_PROVIDER_SITE_OTHER): Payer: Medicare Other | Admitting: Family Medicine

## 2016-02-12 ENCOUNTER — Encounter: Payer: Self-pay | Admitting: Family Medicine

## 2016-02-12 VITALS — BP 103/60 | HR 57 | Temp 98.3°F

## 2016-02-12 DIAGNOSIS — T8454XA Infection and inflammatory reaction due to internal left knee prosthesis, initial encounter: Secondary | ICD-10-CM | POA: Diagnosis not present

## 2016-02-12 DIAGNOSIS — S7002XA Contusion of left hip, initial encounter: Secondary | ICD-10-CM

## 2016-02-12 MED ORDER — CEFTRIAXONE SODIUM 1 G IJ SOLR
1.0000 g | Freq: Once | INTRAMUSCULAR | Status: AC
  Administered 2016-02-12: 1 g via INTRAMUSCULAR

## 2016-02-12 NOTE — Telephone Encounter (Signed)
pts daughter came in to office; pt was in car and pts daughter said that pt refused to go to [facility] or ED; scheduled next available appt with Dr Sarajane Jews today at 2 pm. pts daughter said has dressing over area that has been draining for 3-4 days and pts daughter said pt did fall and has bruise on her hip but pt not having problem with hip, mostly concerned about bakers cyst draining.

## 2016-02-12 NOTE — Progress Notes (Signed)
   Subjective:    Patient ID: [name], female    DOB: 1934-08-01, 82 y.o.   MRN: [id number]  HPI Here for several issues. She normally sees Dr. [name] for primary care but was sent to see [place] today. First of all she has a chronic infection in the left knee, and she sees both Orthopedics and Infectious Disease at [facility] for this. She seems to have a Bakers cyst in the posterior knee that has been infected and as been draining fluid for several months. No fevers. She was seen at [facility] ER on 01-23-16 for this when she was taking Clindamycin. Some fluid was aspirated and a culture revealed no bacterial growth (she had grown MSSA in the past). Her antibiotic was changed that day to Doxycycline and she then saw the Infectious Disease clinic at [facility] on 01-24-16. They changed the antibiotic again to Dicloxacillin and gave her a 4 week follow up appt. Now for the past few days the posterior knee has been more swollen and is draining straw colored fluid. Still no fevers. Also she apparently fell in her kitchen yesterday as she was turning corner around her counter top. She has full memory of this, no LOC, she says her feet simply "got tangled up". She has a bruised area on the left lateral hip and is is mildly tender today, but she can bear full body weight on the hip today.    Review of Systems  Constitutional: Negative.   Respiratory: Negative.   Cardiovascular: Negative.   Musculoskeletal: Positive for arthralgias.  Skin: Positive for wound.  Neurological: Negative.        Objective:   Physical Exam  Constitutional: She is oriented to person, place, and time.  Alert, in a wheel chair   Cardiovascular: Normal rate, regular rhythm, normal heart sounds and intact distal pulses.   Pulmonary/Chest: Effort normal and breath sounds normal.  Musculoskeletal:  There is a small ecchymotic area over the left greater trochanter which is mildly tender  Neurological: She is alert and oriented to  person, place, and time.  Skin:  The left popliteal area is red, swollen, tender, and draining serosanguinous fluid           Assessment & Plan:  She has a mild hip contusion that should heal up fairly quickly. The chronic knee infection has apparently gotten worse. She will stay on the Dicloxacillin but we will also give her a shot of Rocephin today. I instructed her daughter to contact the [facility] ID clinic to be seen ASAP to re-evaluate the area. Laurey Morale, MD

## 2016-02-12 NOTE — Addendum Note (Signed)
Addended by: Aggie Hacker A on: 02/12/2016 03:48 PM   Modules accepted: Orders

## 2016-02-12 NOTE — Telephone Encounter (Signed)
[facility] [name] DOB: September 12, 1934 Initial Comment Caller states her Mother has a bakers cyst on the back of her leg. It's getting bigger and draining. On antibiotics.She fell yesterday and has bruise on her hip. Nurse Assessment Nurse: Markus Daft, RN, Sherre Poot Date/Time (Eastern Time): 02/12/2016 10:59:23 AM Confirm and document reason for call. If symptomatic, describe symptoms. You must click the next button to save text entered. ---1) Caller states that her mother has a painful bakers cyst on the back of her left leg in the fold where the knee bends. It is draining clear to yellow discharge and is about 1 inch raised and red. On oral antibiotics - Dicloxicillin 1 cap QID since 01/24/2016 for this. No fever. Pain is 3/10 now but sometimes increases to 8/10. Hydrocodone for the severe pain - taken at night, and she is out now. Has had only about 2-4 times. 2) She fell yesterday when she tripped while walking (wearing new shoes), and has bruise on her left hip. She is able to walk without a limp. Nothing appears crooked and deformed. Has the patient traveled out of the country within the last 30 days? ---Not Applicable Does the patient have any new or worsening symptoms? ---Yes Will a triage be completed? ---Yes Related visit to physician within the last 2 weeks? ---No Does the PT have any chronic conditions? (i.e. diabetes, asthma, etc.) ---Yes List chronic conditions. ---H/o bilateral knee replacement; Is this a behavioral health or substance abuse call? ---No Guidelines Guideline Title Affirmed Question Affirmed Notes Boil (Skin Abscess) Black (necrotic) color or blisters develop in wound Final Disposition User Go to ED Now (or PCP triage) Markus Daft, RN, [place] Comments -Black in the middle in two spots of wound. -No available appts at [facility] or [facility].  Caller willing to drive to [place] (30-40 min. drive) however, nothing in next hr. OFFICE: Please contact pt with advice from MD. They are aware if no response in an hr to go on in to closest facility. Referrals REFERRED TO PCP OFFICE Disagree/Comply: Comply

## 2016-02-12 NOTE — Progress Notes (Signed)
Pre visit review using our clinic review tool, if applicable. No additional management support is needed unless otherwise documented below in the visit note. Pt unable to stand and weigh.   

## 2016-02-13 ENCOUNTER — Telehealth: Payer: Self-pay

## 2016-02-13 NOTE — Telephone Encounter (Signed)
Pt request refill for lasix; not on current med list and on hx med list by Dr Rayann Heman; pt will ck with Dr Jackalyn Lombard office for refills.

## 2016-02-15 HISTORY — PX: CYST REMOVAL LEG: SHX6280

## 2016-02-17 ENCOUNTER — Other Ambulatory Visit: Payer: Self-pay | Admitting: Internal Medicine

## 2016-02-18 ENCOUNTER — Ambulatory Visit: Payer: Medicare Other | Admitting: Adult Health

## 2016-02-19 ENCOUNTER — Telehealth: Payer: Self-pay | Admitting: Family Medicine

## 2016-02-19 NOTE — Telephone Encounter (Signed)
Spoke with resident physician Dr Carolyn Stare at [facility] regarding pt's current hosp for septic joint  She is to be d/c on oxacillin (aware of allergies and reviewed) as well as rifampin  After careful review of chart-determined she no longer needs to be on anticoag due to the fact that her DVT/PE was years ago and that she does not have afib (psvt instead) Plans to stop her eliquis at d/c   (the abx could affect anticoag which caused the investigation) I am aware and will follow She will remain on abx 6 wk with close ID f/u

## 2016-02-29 ENCOUNTER — Ambulatory Visit: Payer: Medicare Other | Admitting: Family Medicine

## 2016-02-29 ENCOUNTER — Telehealth: Payer: Self-pay | Admitting: Family Medicine

## 2016-02-29 NOTE — Telephone Encounter (Signed)
Please check in with her/her family to see how she is and re schedule when she can make it thanks

## 2016-02-29 NOTE — Telephone Encounter (Signed)
Patient did not come for their scheduled appointment today for f/U [facility] .  Please let me know if the patient needs to be contacted immediately for follow up or if no follow up is necessary.

## 2016-03-05 NOTE — Telephone Encounter (Signed)
Appointment 5/26  Pt stated she was aware of appointment on 5/5

## 2016-03-14 ENCOUNTER — Telehealth: Payer: Self-pay | Admitting: Allergy and Immunology

## 2016-03-14 ENCOUNTER — Ambulatory Visit: Payer: Medicare Other | Admitting: Allergy and Immunology

## 2016-03-14 NOTE — Telephone Encounter (Signed)
Spoke with Dr. Janalyn Harder of Infectious Disease and patient---doing well on Dicloxacillin and which is the preferred choice per ID physician therefore no need do any penicillin skin testing or challenge.  Patient reports she is improving in our communication today.  Will document in system tolerating Penicillin derivative, without issue and delete as allergy of hives--which was >50 years ago. And appt cancelled for today.  Patient will call with any further concerns.

## 2016-03-21 ENCOUNTER — Ambulatory Visit (INDEPENDENT_AMBULATORY_CARE_PROVIDER_SITE_OTHER): Payer: Medicare Other | Admitting: Family Medicine

## 2016-03-21 ENCOUNTER — Encounter: Payer: Self-pay | Admitting: Family Medicine

## 2016-03-21 VITALS — BP 116/64 | HR 62 | Temp 98.2°F | Ht 67.0 in | Wt 199.5 lb

## 2016-03-21 DIAGNOSIS — R05 Cough: Secondary | ICD-10-CM | POA: Diagnosis not present

## 2016-03-21 DIAGNOSIS — T8454XA Infection and inflammatory reaction due to internal left knee prosthesis, initial encounter: Secondary | ICD-10-CM

## 2016-03-21 DIAGNOSIS — I272 Other secondary pulmonary hypertension: Secondary | ICD-10-CM | POA: Diagnosis not present

## 2016-03-21 DIAGNOSIS — I10 Essential (primary) hypertension: Secondary | ICD-10-CM | POA: Diagnosis not present

## 2016-03-21 DIAGNOSIS — R053 Chronic cough: Secondary | ICD-10-CM

## 2016-03-21 NOTE — Assessment & Plan Note (Signed)
bp in fair control at this time  BP Readings from Last 1 Encounters:  03/21/16 116/64   No changes needed Disc lifstyle change with low sodium diet and exercise  Has done well through recent hospitalization  Rev labs from [facility]

## 2016-03-21 NOTE — Assessment & Plan Note (Signed)
Stable/no new symptoms  Did well through hospitalization for knee infection

## 2016-03-21 NOTE — Assessment & Plan Note (Signed)
Still occ cough-overall improved  Reassuring exam

## 2016-03-21 NOTE — Progress Notes (Signed)
Subjective:    Patient ID: [name], female    DOB: May 26, 1934, 82 y.o.   MRN: [id number]  HPI Here for f/u after hosp at [facility] for septic knee (cellulitis and abscess of L knee prosthesis --s/p rev total L knee) Had I and D in OR on 4/21 hosp 4/19-4/26/17 Records rev in care everywhere  In hosp tx with oxacillin and rifampin Changed to dicloxacillin by her ID doctor - changed again to keflex  Oral rifampin until early June - need to change pharmacy Does keflex via picc tid - until early June also   cx showed MSSA   Was d/c to nursing facility with PICC line for abx dosage Also PT  hosp took her off her eliquis  Due to poss interaction with rifampin and fact that dvt was so long ago   Came home on 4/31  Overall feeling much much better   Knee is still draining a lot from a small place - overall improved  Saturated yellow  Daughter is packing it   Does not think she needs the protonix anymore -no gerd symptoms and no longer in the hospital   No diarrhea - wondered it it would be ok to take probiotic-home nurse suggested it   Home nurse changes bandage weekly   Expects to get the picc line out early June    Patient Active Problem List   Diagnosis Date Noted  . Constipation 01/09/2016  . Productive cough 12/26/2015  . Umbilical hernia [id number]  . Chronic respiratory failure ([place]) 10/05/2015  . Thromboembolism of vein 10/02/2015  . Infection of prosthetic joint ([place]) 09/18/2015  . Infection of prosthetic left knee joint ([place]) 09/12/2015  . Effusion of knee 08/30/2015  . Insect bite, infected 07/11/2015  . Pulmonary hypertension ([place])   . Chest pain 02/25/2015  . Physical deconditioning 02/25/2015  . RBBB-new 02/12/2015  . History of bilat PE after TKR (off Coumadin Dec 2015) 02/12/2015  . Abnormal EKG-TWI, RBBB, prolonged QT   . Hernia, umbilical [id number]  . Hx of colonic polyps 10/24/2014  . Diverticulosis of colon without hemorrhage   . OSA  (obstructive sleep apnea) 07/22/2014  . Unspecified arthropathy, lower leg 03/21/2014  . History of right knee joint replacement 03/15/2014  . Arthritis of knee 03/09/2014  . Solitary pulmonary nodule 05/03/2013  . Sternal fracture 04/26/2013  . PE (pulmonary embolism) 11/24/2012  . History of DVT (deep vein thrombosis) 11/24/2012  . History of artificial joint 10/29/2012  . 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 BP (high blood pressure) 10/14/2012  . Complications due to internal joint prosthesis ([place]) 09/07/2012  . PES PLANUS 08/15/2010  . URINARY FREQUENCY, CHRONIC 07/14/2008  . Dyslipidemia 07/09/2007  . Essential hypertension 07/09/2007  . PSVT with SSS 02/23/2007  . SCLERODERMA 02/23/2007  . DEGENERATIVE JOINT DISEASE 02/23/2007  . SPINAL STENOSIS, LUMBAR 02/23/2007  . OSTEOPENIA 02/23/2007  . SPONDYLOLISTHESIS 02/23/2007   Past Medical History  Diagnosis Date  . Osteopenia   . Hyperlipidemia   . Hypertension   . PSVT (paroxysmal supraventricular tachycardia) ([place])   . Spinal stenosis   . Overactive bladder     urge incontinence  . Pulmonary emboli ([place])   . Pulmonary hypertension ([place])   . Right ventricular failure ([place])   . Arthritis     knee  . Anemia   . Diverticulosis   . Sleep apnea     CPAP  . Hx of colonic polyps 10/24/2014   Past Surgical History  Procedure Laterality Date  . Cataract extraction Bilateral 2008  . Bladder suspension  1993  . Total knee arthroplasty Left 2000  . Total knee revision  01/12/2012    Procedure: TOTAL KNEE REVISION;  Surgeon: Kerin Salen, MD;  Location: [place] [facility];  Service: Orthopedics;  Laterality: Left;  DEPUY/ LCS , HAND SET  . Trigger finger release  01/12/2012    Procedure: RELEASE TRIGGER FINGER/A-1 PULLEY;  Surgeon: Kerin Salen, MD;  Location: [place];  Service: Orthopedics;  Laterality: Right;  . Cholecystectomy    . Incisional hernia repair    . Total knee arthroplasty Left 10/29/2012    at [facility]  .  Total knee arthroplasty Right 03/09/2014  . Colonoscopy    . Right heart catheterization N/A 07/04/2014    Procedure: RIGHT HEART CATH;  Surgeon: Jolaine Artist, MD;  Location: [facility] CATH LAB;  Service: Cardiovascular;  Laterality: N/A;  . Tonsillectomy and adenoidectomy    . Colonoscopy N/A 10/24/2014    Procedure: COLONOSCOPY;  Surgeon: Gatha Mayer, MD;  Location: [facility] ENDOSCOPY;  Service: Endoscopy;  Laterality: N/A;  . Cardiac catheterization N/A 02/28/2015    Procedure: Right/Left Heart Cath and Coronary Angiography;  Surgeon: Troy Sine, MD;  Location: [facility] INVASIVE CV LAB CUPID;  Service: Cardiovascular;  Laterality: N/A;  . Electrophysiologic study N/A 03/02/2015    Procedure: SVT Ablation;  Surgeon: Evans Lance, MD;  Location: [place] CV LAB;  Service: Cardiovascular;  Laterality: N/A;   Social History  Substance Use Topics  . Smoking status: Never Smoker   . Smokeless tobacco: Never Used  . Alcohol Use: No   Family History  Problem Relation Age of Onset  . Breast cancer Mother   . Heart disease Father   . Diabetes Father   . Hypertension Father   . Uterine cancer Sister    Allergies  Allergen Reactions  . Zithromax [Azithromycin] Other (See Comments)    Prolonged QT on EKG  . Sulfonamide Derivatives Hives  . Adhesive [Tape] Other (See Comments) and Rash    redness  . Prednisone Other (See Comments)    Causes insomnia   Current Outpatient Prescriptions on File Prior to Visit  Medication Sig Dispense Refill  . benzonatate (TESSALON) 200 MG capsule TAKE ONE CAPSULE BY MOUTH TWICE DAILY ASNEEDED FOR COUGH 180 capsule 2  . Calcium Carb-Cholecalciferol (CALCIUM 600/VITAMIN D3) 600-800 MG-UNIT TABS Take 1 tablet by mouth 2 (two) times daily.    . Cholecalciferol (VITAMIN D-1000 MAX ST) 1000 UNITS tablet Take 1,000 Units by mouth daily.    . hydrochlorothiazide (HYDRODIURIL) 25 MG tablet Take 1 tablet (25 mg total) by mouth daily. 90 tablet 3  . metoprolol tartrate  (LOPRESSOR) 25 MG tablet Take 0.5 tablets (12.5 mg total) by mouth 2 (two) times daily. 90 tablet 3  . Multiple Vitamin (MULTIVITAMIN WITH MINERALS) TABS tablet Take 1 tablet by mouth daily.    . naproxen sodium (ANAPROX) 220 MG tablet Take 220 mg by mouth daily as needed (pain).     . polyethylene glycol (MIRALAX / GLYCOLAX) packet Take 17 g by mouth daily.      No current facility-administered medications on file prior to visit.    Review of Systems    Review of Systems  Constitutional: Negative for fever, appetite change, fatigue and unexpected weight change.  Eyes: Negative for pain and visual disturbance.  Respiratory: Negative for wheeze and shortness of breath.  pos for occ cough which has improved  Cardiovascular: Negative for cp or palpitations    Gastrointestinal: Negative for nausea, diarrhea and constipation.  Genitourinary: Negative for urgency and frequency.  Skin: Negative for pallor or rash   Neurological: Negative for weakness, light-headedness, numbness and headaches.  MSK pos for occ knee soreness when changing packing -overall improved  Hematological: Negative for adenopathy. Does not bruise/bleed easily.  Psychiatric/Behavioral: Negative for dysphoric mood. The patient is not nervous/anxious.      Objective:   Physical Exam  Constitutional: She appears well-developed and well-nourished. No distress.  HENT:  Head: Normocephalic and atraumatic.  Mouth/Throat: Oropharynx is clear and moist.  Eyes: Conjunctivae and EOM are normal. Pupils are equal, round, and reactive to light.  Neck: Normal range of motion. Neck supple. No JVD present. Carotid bruit is not present. No thyromegaly present.  Cardiovascular: Normal rate, regular rhythm, normal heart sounds and intact distal pulses.  Exam reveals no gallop.   Pulmonary/Chest: Effort normal and breath sounds normal. No respiratory distress. She has no wheezes. She has no rales.  No crackles  occ cough is dry sounding  and mild  Abdominal: Soft. Bowel sounds are normal. She exhibits no distension, no abdominal bruit and no mass. There is no tenderness.  Musculoskeletal: She exhibits edema.  L knee - improved swelling Several open areas healing 1 cm wound posteriorly with less than an inch of erythema surrounding-packed with iodoform guaze and draining yellow clear fluid   Lymphadenopathy:    She has no cervical adenopathy.  Neurological: She is alert. She has normal reflexes.  Skin: Skin is warm and dry. No rash noted.  Psychiatric: She has a normal mood and affect.  Cheerful and talkative           Assessment & Plan:   Problem List Items Addressed This Visit      Cardiovascular and Mediastinum   Pulmonary hypertension ([place])    Stable/no new symptoms  Did well through hospitalization for knee infection       Essential hypertension - Primary    bp in fair control at this time  BP Readings from Last 1 Encounters:  03/21/16 116/64   No changes needed Disc lifstyle change with low sodium diet and exercise  Has done well through recent hospitalization  Rev labs from [facility]        Musculoskeletal and Integument   Infection of prosthetic left knee joint ([place])    Reviewed hospital records from [facility] with pt and family  Doing well with Keflex IV oral rifampin- seeing ID Small area posterior knee draining clear straw covered fluid- packed now /looks good  Has had PT  Enc slow walking in the house and elevation when sitting  Adv she can take a probiotic like align during prolonged abx course as well       Relevant Medications   cephALEXin (KEFLEX) 500 MG capsule (Start on 03/29/2016)     Other   RESOLVED: Chronic cough    Still occ cough-overall improved  Reassuring exam

## 2016-03-21 NOTE — Patient Instructions (Addendum)
No changes in medicines except stop the protonix  If you develop heartburn symptoms or stomach pain -start it back  Have your Rifampin transferred to [facility]  Make sure to keep moving - make a lap around the house every hour or so  Try to eat a healthy diet and don't forget to drink fluids

## 2016-03-21 NOTE — Progress Notes (Signed)
Pre visit review using our clinic review tool, if applicable. No additional management support is needed unless otherwise documented below in the visit note. 

## 2016-03-21 NOTE — Assessment & Plan Note (Signed)
Reviewed hospital records from [facility] with pt and family  Doing well with Keflex IV oral rifampin- seeing ID Small area posterior knee draining clear straw covered fluid- packed now /looks good  Has had PT  Enc slow walking in the house and elevation when sitting  Adv she can take a probiotic like align during prolonged abx course as well

## 2016-04-22 ENCOUNTER — Encounter (HOSPITAL_BASED_OUTPATIENT_CLINIC_OR_DEPARTMENT_OTHER): Payer: Medicare Other | Attending: Surgery

## 2016-04-22 DIAGNOSIS — Y838 Other surgical procedures as the cause of abnormal reaction of the patient, or of later complication, without mention of misadventure at the time of the procedure: Secondary | ICD-10-CM | POA: Insufficient documentation

## 2016-04-22 DIAGNOSIS — Z79899 Other long term (current) drug therapy: Secondary | ICD-10-CM | POA: Insufficient documentation

## 2016-04-22 DIAGNOSIS — I10 Essential (primary) hypertension: Secondary | ICD-10-CM | POA: Diagnosis not present

## 2016-04-22 DIAGNOSIS — G4730 Sleep apnea, unspecified: Secondary | ICD-10-CM | POA: Insufficient documentation

## 2016-04-22 DIAGNOSIS — Z86718 Personal history of other venous thrombosis and embolism: Secondary | ICD-10-CM | POA: Insufficient documentation

## 2016-04-22 DIAGNOSIS — I480 Paroxysmal atrial fibrillation: Secondary | ICD-10-CM | POA: Diagnosis not present

## 2016-04-22 DIAGNOSIS — T8189XA Other complications of procedures, not elsewhere classified, initial encounter: Secondary | ICD-10-CM | POA: Insufficient documentation

## 2016-04-22 DIAGNOSIS — Z96653 Presence of artificial knee joint, bilateral: Secondary | ICD-10-CM | POA: Diagnosis not present

## 2016-04-22 DIAGNOSIS — Z9989 Dependence on other enabling machines and devices: Secondary | ICD-10-CM | POA: Insufficient documentation

## 2016-04-22 DIAGNOSIS — M1990 Unspecified osteoarthritis, unspecified site: Secondary | ICD-10-CM | POA: Diagnosis not present

## 2016-05-01 ENCOUNTER — Ambulatory Visit (INDEPENDENT_AMBULATORY_CARE_PROVIDER_SITE_OTHER)
Admission: RE | Admit: 2016-05-01 | Discharge: 2016-05-01 | Disposition: A | Discharge status: Home / Self Care | Payer: Medicare Other | Source: Ambulatory Visit | Attending: Internal Medicine | Admitting: Internal Medicine

## 2016-05-01 ENCOUNTER — Encounter: Payer: Self-pay | Admitting: Internal Medicine

## 2016-05-01 ENCOUNTER — Ambulatory Visit (INDEPENDENT_AMBULATORY_CARE_PROVIDER_SITE_OTHER): Payer: Medicare Other | Admitting: Internal Medicine

## 2016-05-01 VITALS — BP 100/66 | HR 86 | Temp 98.2°F | Resp 22 | Wt 202.0 lb

## 2016-05-01 DIAGNOSIS — R05 Cough: Secondary | ICD-10-CM

## 2016-05-01 DIAGNOSIS — R053 Chronic cough: Secondary | ICD-10-CM | POA: Insufficient documentation

## 2016-05-01 NOTE — Progress Notes (Signed)
Subjective:    Patient ID: [name], female    DOB: 22-Jul-1934, 82 y.o.   MRN: [id number]  HPI Here due to cough Has had cough that goes back 2 years Worse now--but doesn't have any energy Dr [name] has told her it may be allergy related Robitussin helps a little--but then comes right back Worse when going to bed Not productive at present No heartburn or apparent reflux Not much nasal drainage or PND  Still on keflex tid and rifampin for knee prosthesis infection Still uses oxygen at night  Current Outpatient Prescriptions on File Prior to Visit  Medication Sig Dispense Refill  . benzonatate (TESSALON) 200 MG capsule TAKE ONE CAPSULE BY MOUTH TWICE DAILY ASNEEDED FOR COUGH 180 capsule 2  . Calcium Carb-Cholecalciferol (CALCIUM 600/VITAMIN D3) 600-800 MG-UNIT TABS Take 1 tablet by mouth 2 (two) times daily.    . cephALEXin (KEFLEX) 500 MG capsule Take 500 mg by mouth 3 (three) times daily.    . Cholecalciferol (VITAMIN D-1000 MAX ST) 1000 UNITS tablet Take 1,000 Units by mouth daily.    . hydrochlorothiazide (HYDRODIURIL) 25 MG tablet Take 1 tablet (25 mg total) by mouth daily. 90 tablet 3  . metoprolol tartrate (LOPRESSOR) 25 MG tablet Take 0.5 tablets (12.5 mg total) by mouth 2 (two) times daily. 90 tablet 3  . Multiple Vitamin (MULTIVITAMIN WITH MINERALS) TABS tablet Take 1 tablet by mouth daily.    . naproxen sodium (ANAPROX) 220 MG tablet Take 220 mg by mouth daily as needed (pain).     . polyethylene glycol (MIRALAX / GLYCOLAX) packet Take 17 g by mouth daily.      No current facility-administered medications on file prior to visit.    Allergies  Allergen Reactions  . Zithromax [Azithromycin] Other (See Comments)    Prolonged QT on EKG  . Sulfonamide Derivatives Hives  . Adhesive [Tape] Other (See Comments) and Rash    redness  . Prednisone Other (See Comments)    Causes insomnia    Past Medical History  Diagnosis Date  . Osteopenia   . Hyperlipidemia   .  Hypertension   . PSVT (paroxysmal supraventricular tachycardia) ([place])   . Spinal stenosis   . Overactive bladder     urge incontinence  . Pulmonary emboli ([place])   . Pulmonary hypertension ([place])   . Right ventricular failure ([place])   . Arthritis     knee  . Anemia   . Diverticulosis   . Sleep apnea     CPAP  . Hx of colonic polyps 10/24/2014    Past Surgical History  Procedure Laterality Date  . Cataract extraction Bilateral 2008  . Bladder suspension  1993  . Total knee arthroplasty Left 2000  . Total knee revision  01/12/2012    Procedure: TOTAL KNEE REVISION;  Surgeon: Kerin Salen, MD;  Location: [place];  Service: Orthopedics;  Laterality: Left;  DEPUY/ LCS , HAND SET  . Trigger finger release  01/12/2012    Procedure: RELEASE TRIGGER FINGER/A-1 PULLEY;  Surgeon: Kerin Salen, MD;  Location: [place];  Service: Orthopedics;  Laterality: Right;  . Cholecystectomy    . Incisional hernia repair    . Total knee arthroplasty Left 10/29/2012    at [facility]  . Total knee arthroplasty Right 03/09/2014  . Colonoscopy    . Right heart catheterization N/A 07/04/2014    Procedure: RIGHT HEART CATH;  Surgeon: Jolaine Artist, MD;  Location: [facility] CATH LAB;  Service: Cardiovascular;  Laterality: N/A;  . Tonsillectomy and adenoidectomy    . Colonoscopy N/A 10/24/2014    Procedure: COLONOSCOPY;  Surgeon: Gatha Mayer, MD;  Location: [facility] ENDOSCOPY;  Service: Endoscopy;  Laterality: N/A;  . Cardiac catheterization N/A 02/28/2015    Procedure: Right/Left Heart Cath and Coronary Angiography;  Surgeon: Troy Sine, MD;  Location: [place] INVASIVE CV LAB CUPID;  Service: Cardiovascular;  Laterality: N/A;  . Electrophysiologic study N/A 03/02/2015    Procedure: SVT Ablation;  Surgeon: Evans Lance, MD;  Location: [place] CV LAB;  Service: Cardiovascular;  Laterality: N/A;    Family History  Problem Relation Age of Onset  . Breast cancer Mother   . Heart disease Father   . Diabetes Father   .  Hypertension Father   . Uterine cancer Sister     Social History   Social History  . Marital Status: Widowed    Spouse Name: N/A  . Number of Children: 3  . Years of Education: N/A   Occupational History  . Retired   .     Social History Main Topics  . Smoking status: Never Smoker   . Smokeless tobacco: Never Used  . Alcohol Use: No  . Drug Use: No  . Sexual Activity: No   Other Topics Concern  . Not on file   Social History Narrative   Pt lives alone but dtr lives on property and has been staying with her since surgery.   Review of Systems Appetite is okay Sleeps is okay once she initiates. Uses 2 pillows. Does well with the CPAP    Objective:   Physical Exam  Constitutional: She appears well-developed and well-nourished. No distress.  HENT:  Nose: Nose normal.  Mouth/Throat: Oropharynx is clear and moist. No oropharyngeal exudate.  No sinus tenderness TMs normal  Neck: Normal range of motion. No thyromegaly present.  Cardiovascular: Normal rate, regular rhythm and normal heart sounds.  Exam reveals no gallop.   No murmur heard. Pulmonary/Chest: Effort normal and breath sounds normal. No respiratory distress. She has no wheezes. She has no rales.  Lymphadenopathy:    She has no cervical adenopathy.          Assessment & Plan:

## 2016-05-01 NOTE — Progress Notes (Signed)
Pre visit review using our clinic review tool, if applicable. No additional management support is needed unless otherwise documented below in the visit note. 

## 2016-05-01 NOTE — Assessment & Plan Note (Addendum)
Goes back 2 years Doesn't seem sick at this point No history of reflux or allergy type symptoms Little help from robitussin or benzonatate No history of asthma--doesn't seem to be bronchospasm Repeat CXR shows no pneumonia but seems hyperexpanded  Not sure what to treat with but no emergency Will ask Dr Elsworth Soho to see her (consider PFTs and empiric Rx for chronic lung disease?)

## 2016-05-06 ENCOUNTER — Encounter (HOSPITAL_BASED_OUTPATIENT_CLINIC_OR_DEPARTMENT_OTHER): Payer: Medicare Other

## 2016-05-06 ENCOUNTER — Encounter (HOSPITAL_BASED_OUTPATIENT_CLINIC_OR_DEPARTMENT_OTHER): Payer: Medicare Other | Attending: Surgery

## 2016-05-06 DIAGNOSIS — M25862 Other specified joint disorders, left knee: Secondary | ICD-10-CM | POA: Diagnosis not present

## 2016-05-06 DIAGNOSIS — Z86718 Personal history of other venous thrombosis and embolism: Secondary | ICD-10-CM | POA: Diagnosis not present

## 2016-05-06 DIAGNOSIS — Z96653 Presence of artificial knee joint, bilateral: Secondary | ICD-10-CM | POA: Insufficient documentation

## 2016-05-06 DIAGNOSIS — Y838 Other surgical procedures as the cause of abnormal reaction of the patient, or of later complication, without mention of misadventure at the time of the procedure: Secondary | ICD-10-CM | POA: Insufficient documentation

## 2016-05-06 DIAGNOSIS — B9561 Methicillin susceptible Staphylococcus aureus infection as the cause of diseases classified elsewhere: Secondary | ICD-10-CM | POA: Diagnosis not present

## 2016-05-06 DIAGNOSIS — T8131XA Disruption of external operation (surgical) wound, not elsewhere classified, initial encounter: Secondary | ICD-10-CM | POA: Diagnosis not present

## 2016-05-06 DIAGNOSIS — I10 Essential (primary) hypertension: Secondary | ICD-10-CM | POA: Insufficient documentation

## 2016-05-06 DIAGNOSIS — M71062 Abscess of bursa, left knee: Secondary | ICD-10-CM | POA: Diagnosis not present

## 2016-05-06 DIAGNOSIS — G4730 Sleep apnea, unspecified: Secondary | ICD-10-CM | POA: Diagnosis not present

## 2016-05-06 DIAGNOSIS — I480 Paroxysmal atrial fibrillation: Secondary | ICD-10-CM | POA: Insufficient documentation

## 2016-05-13 DIAGNOSIS — T8131XA Disruption of external operation (surgical) wound, not elsewhere classified, initial encounter: Secondary | ICD-10-CM | POA: Diagnosis not present

## 2016-05-20 DIAGNOSIS — T8131XA Disruption of external operation (surgical) wound, not elsewhere classified, initial encounter: Secondary | ICD-10-CM | POA: Diagnosis not present

## 2016-05-26 ENCOUNTER — Encounter: Payer: Self-pay | Admitting: Adult Health

## 2016-05-26 ENCOUNTER — Ambulatory Visit (INDEPENDENT_AMBULATORY_CARE_PROVIDER_SITE_OTHER): Payer: Medicare Other | Admitting: Adult Health

## 2016-05-26 DIAGNOSIS — G4733 Obstructive sleep apnea (adult) (pediatric): Secondary | ICD-10-CM | POA: Diagnosis not present

## 2016-05-26 DIAGNOSIS — R05 Cough: Secondary | ICD-10-CM

## 2016-05-26 DIAGNOSIS — R053 Chronic cough: Secondary | ICD-10-CM

## 2016-05-26 NOTE — Assessment & Plan Note (Signed)
Encouraged on compliance   Plan  ry to use CPAP with oxygen At bedtime each night .  Goal is to wear at least 4-6hr each night.  Follow up Dr. Elsworth Soho  In 4-6 months and As needed

## 2016-05-26 NOTE — Progress Notes (Signed)
Subjective:    Patient ID: [name], female    DOB: Oct 29, 1933, 82 y.o.   MRN: [id number]  HPI  82 year old woman with pulmonary hypertension, OSA and hypoxic RF on O2 (w/ CPAP) , PE , Cor Pulmonale and PSVT /A-Fib s/p Ablation 02/2015   She had a complicated course after left knee replacement in 2013 due to a DVT/PE. Tx w/ anticoagulation >1 year, Off Xarelto Dec 2015.     05/26/2016 Follow up : : Cor Pulmonale, Pulmonary HTN, , OSA on CPAP and O2 w/ act and At bedtime   Pt presents for a 4 month follow up  RA pt is here for a 4 month follow up. Uses CPAP on average 4 hours nightly, mask fits well.    says she has a dry cough that comes and goes. No fever, or discolored mucus.  CXR 05/01/16 with chronic changes. NAD.  PVX and Prevnar utd.   She has mild pulmonary HTN on RHC with PAP at 25 . Felt secondary to severe OSA/OHS .  She is suppose to be on CPAP .  Uses CPAP on average 4 hours nightly, mask fits well. Download with ok compliance (60%) . AHI 2.6, avg usage at 2.75hr each night . We discussed compliance  Says leg swelling is doing good.     Past Medical History:  Diagnosis Date  . Anemia   . Arthritis    knee  . Diverticulosis   . Hx of colonic polyps 10/24/2014  . Hyperlipidemia   . Hypertension   . Osteopenia   . Overactive bladder    urge incontinence  . PSVT (paroxysmal supraventricular tachycardia) ([place])   . Pulmonary emboli ([place])   . Pulmonary hypertension ([place])   . Right ventricular failure ([place])   . Sleep apnea    CPAP  . Spinal stenosis    Current Outpatient Prescriptions on File Prior to Visit  Medication Sig Dispense Refill  . benzonatate (TESSALON) 200 MG capsule TAKE ONE CAPSULE BY MOUTH TWICE DAILY ASNEEDED FOR COUGH 180 capsule 2  . Calcium Carb-Cholecalciferol (CALCIUM 600/VITAMIN D3) 600-800 MG-UNIT TABS Take 1 tablet by mouth 2 (two) times daily.    . cephALEXin (KEFLEX) 500 MG capsule Take 500 mg by mouth 3 (three) times daily.    .  Cholecalciferol (VITAMIN D-1000 MAX ST) 1000 UNITS tablet Take 1,000 Units by mouth daily.    . hydrochlorothiazide (HYDRODIURIL) 25 MG tablet Take 1 tablet (25 mg total) by mouth daily. 90 tablet 3  . metoprolol tartrate (LOPRESSOR) 25 MG tablet Take 0.5 tablets (12.5 mg total) by mouth 2 (two) times daily. 90 tablet 3  . Multiple Vitamin (MULTIVITAMIN WITH MINERALS) TABS tablet Take 1 tablet by mouth daily.    . naproxen sodium (ANAPROX) 220 MG tablet Take 220 mg by mouth daily as needed (pain).     . polyethylene glycol (MIRALAX / GLYCOLAX) packet Take 17 g by mouth daily.     . rifampin (RIFADIN) 300 MG capsule Take 300 mg by mouth 2 (two) times daily.     No current facility-administered medications on file prior to visit.       Review of Systems Constitutional:   No  weight loss, night sweats,  Fevers, chills,  +fatigue, or  lassitude.  HEENT:   No headaches,  Difficulty swallowing,  Tooth/dental problems, or  Sore throat,                No sneezing, itching, ear ache, nasal congestion,  post nasal drip,   CV:  No chest pain,  Orthopnea, PND,  , anasarca, dizziness, palpitations, syncope.   GI  No heartburn, indigestion, abdominal pain, nausea, vomiting, diarrhea, change in bowel habits, loss of appetite, bloody stools.   Resp .  No excess mucus, no productive cough,  No non-productive cough,  No coughing up of blood.  No change in color of mucus.  No wheezing.  No chest wall deformity  Skin: no rash or lesions.  GU: no dysuria, change in color of urine, no urgency or frequency.  No flank pain, no hematuria   MS:  +knee pain/ swelling.  Psych:  No change in mood or affect. No depression or anxiety.  No memory loss.         Objective:   Physical Exam   Vitals:   05/26/16 1405  BP: 116/70  Pulse: 67  Temp: 98.3 F (36.8 C)  TempSrc: Oral  SpO2: 93%  Weight: 207 lb (93.9 kg)  Height: 5\' 6"  (1.676 m)    GEN: A/Ox3; pleasant , NAD, elderly  VS reviewed      HEENT:  [place]/AT,  EACs-clear, TMs-wnl, NOSE-clear, THROAT-clear, no lesions, no postnasal drip or exudate noted.   NECK:  Supple w/ fair ROM; no JVD; normal carotid impulses w/o bruits; no thyromegaly or nodules palpated; no lymphadenopathy.    RESP  Decreased BS in bases w/o wheezes/ rales/ or rhonchi. no accessory muscle use, no dullness to percussion  CARD:  RRR, no m/r/g  , tr  peripheral edema, pulses intact, no cyanosis or clubbing.  GI:   Soft & nt; nml bowel sounds; no organomegaly or masses detected.   Musco: Warm bil, no deformities or joint swelling noted.   Neuro: alert, no focal deficits noted.    Skin: Warm, no lesions or rashes    .Tammy Parrett NP-C  [facility]  05/26/2016

## 2016-05-26 NOTE — Patient Instructions (Signed)
Try to use CPAP with oxygen At bedtime each night .  Goal is to wear at least 4-6hr each night.  Follow up Dr. Elsworth Soho  In 4-6 months and As needed

## 2016-05-27 ENCOUNTER — Encounter (HOSPITAL_BASED_OUTPATIENT_CLINIC_OR_DEPARTMENT_OTHER): Payer: Medicare Other | Attending: Surgery

## 2016-05-27 DIAGNOSIS — Z96652 Presence of left artificial knee joint: Secondary | ICD-10-CM | POA: Diagnosis not present

## 2016-05-27 DIAGNOSIS — Y838 Other surgical procedures as the cause of abnormal reaction of the patient, or of later complication, without mention of misadventure at the time of the procedure: Secondary | ICD-10-CM | POA: Insufficient documentation

## 2016-05-27 DIAGNOSIS — I480 Paroxysmal atrial fibrillation: Secondary | ICD-10-CM | POA: Diagnosis not present

## 2016-05-27 DIAGNOSIS — T8189XA Other complications of procedures, not elsewhere classified, initial encounter: Secondary | ICD-10-CM | POA: Diagnosis present

## 2016-05-27 DIAGNOSIS — I10 Essential (primary) hypertension: Secondary | ICD-10-CM | POA: Insufficient documentation

## 2016-05-27 DIAGNOSIS — M1990 Unspecified osteoarthritis, unspecified site: Secondary | ICD-10-CM | POA: Insufficient documentation

## 2016-05-27 MED ORDER — BENZONATATE 200 MG PO CAPS: ORAL_CAPSULE | ORAL | 0 refills | Status: DC

## 2016-05-27 NOTE — Addendum Note (Signed)
Addended by: Osa Craver on: 05/27/2016 05:18 PM   Modules accepted: Orders

## 2016-05-27 NOTE — Assessment & Plan Note (Addendum)
Late add: discussed with pt she has had an ongoing dry cough for last few months that comes and goes Predominately at night. Denies overt reflux or drainage  Has used tessalon with some help but ran out. Using robitussin dm now.  No fever , discolored mucus , chest pain orthopnea, dyspnea or hemoptyiss . No calf pain or edema.  Will check PFT on return in 2-3 months with Dr. Elsworth Soho   Add delsym 2 tsp At bedtime  For cough  Add Pepcid 20mg  At bedtime .

## 2016-06-04 ENCOUNTER — Ambulatory Visit (INDEPENDENT_AMBULATORY_CARE_PROVIDER_SITE_OTHER): Payer: Medicare Other | Admitting: Family Medicine

## 2016-06-04 ENCOUNTER — Telehealth: Payer: Self-pay | Admitting: Family Medicine

## 2016-06-04 ENCOUNTER — Encounter: Payer: Self-pay | Admitting: Family Medicine

## 2016-06-04 VITALS — BP 106/64 | HR 56 | Temp 98.2°F | Ht 66.0 in | Wt 203.0 lb

## 2016-06-04 DIAGNOSIS — I10 Essential (primary) hypertension: Secondary | ICD-10-CM | POA: Diagnosis not present

## 2016-06-04 DIAGNOSIS — F430 Acute stress reaction: Secondary | ICD-10-CM

## 2016-06-04 DIAGNOSIS — R05 Cough: Secondary | ICD-10-CM

## 2016-06-04 DIAGNOSIS — F411 Generalized anxiety disorder: Secondary | ICD-10-CM | POA: Insufficient documentation

## 2016-06-04 DIAGNOSIS — R053 Chronic cough: Secondary | ICD-10-CM

## 2016-06-04 DIAGNOSIS — L304 Erythema intertrigo: Secondary | ICD-10-CM | POA: Insufficient documentation

## 2016-06-04 MED ORDER — KETOCONAZOLE 2 % EX CREA: 1.0000 "application " | TOPICAL_CREAM | Freq: Two times a day (BID) | CUTANEOUS | 2 refills | Status: DC | PRN

## 2016-06-04 NOTE — Progress Notes (Signed)
Subjective:    Patient ID: [name], female    DOB: May 15, 1934, 82 y.o.   MRN: [id number]  HPI Here for mood issues/rash and cough  Noted bp is nl to low nl  No symptoms   Has had a chronic cough Seen by pulmonary last mo- planned to check PFT and f/u with Dr Elsworth Soho Was inst to use delsym and pepcid   Mood - tearfulness off and on and feeling of sadness "comes like a cloud"  About 3 weeks Found out her leg problem is chronic -nothing more they can do from it Has to continue medication for infection indefinitely - but neg cx Amputation would be option if symptoms some back  Hard not to worry  Would be willing to see a counselor  Would take medication only if needed Not anxious often  Frustrated about medical situation   On rifampin and keflex    Rash Private area - perineal  Over a week  Treated with desitin and "calmiseptic" ointment - from hospital/ unsure what that is  No vaginal d/c or bleeding  Itching and burning  More on the outside  Does wear undergarments for incontinence Most of rash is in skin folds    Swelling in feet some also / look discolored at times - hx of broken veins No pain  No sob  Uses cpap also - has difficulty with it   Wt is down 4 lb bmi is 32  Patient Active Problem List   Diagnosis Date Noted  . Stress reaction 06/04/2016  . Intertrigo 06/04/2016  . Chronic cough 05/01/2016  . Constipation 01/09/2016  . Productive cough 12/26/2015  . Umbilical hernia [id number]  . Chronic respiratory failure ([place]) 10/05/2015  . Thromboembolism of vein 10/02/2015  . Infection of prosthetic joint ([place]) 09/18/2015  . Infection of prosthetic left knee joint ([place]) 09/12/2015  . Effusion of knee 08/30/2015  . Insect bite, infected 07/11/2015  . Pulmonary hypertension ([place])   . Chest pain 02/25/2015  . Physical deconditioning 02/25/2015  . RBBB-new 02/12/2015  . History of bilat PE after TKR (off Coumadin Dec 2015) 02/12/2015  . Abnormal  EKG-TWI, RBBB, prolonged QT   . Hernia, umbilical [id number]  . Hx of colonic polyps 10/24/2014  . Diverticulosis of colon without hemorrhage   . OSA (obstructive sleep apnea) 07/22/2014  . Unspecified arthropathy, lower leg 03/21/2014  . History of right knee joint replacement 03/15/2014  . Arthritis of knee 03/09/2014  . Solitary pulmonary nodule 05/03/2013  . Sternal fracture 04/26/2013  . PE (pulmonary embolism) 11/24/2012  . History of DVT (deep vein thrombosis) 11/24/2012  . History of artificial joint 10/29/2012  . 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 BP (high blood pressure) 10/14/2012  . Complications due to internal joint prosthesis ([place]) 09/07/2012  . PES PLANUS 08/15/2010  . URINARY FREQUENCY, CHRONIC 07/14/2008  . Dyslipidemia 07/09/2007  . Essential hypertension 07/09/2007  . PSVT with SSS 02/23/2007  . SCLERODERMA 02/23/2007  . DEGENERATIVE JOINT DISEASE 02/23/2007  . SPINAL STENOSIS, LUMBAR 02/23/2007  . OSTEOPENIA 02/23/2007  . SPONDYLOLISTHESIS 02/23/2007   Past Medical History:  Diagnosis Date  . Anemia   . Arthritis    knee  . Diverticulosis   . Hx of colonic polyps 10/24/2014  . Hyperlipidemia   . Hypertension   . Osteopenia   . Overactive bladder    urge incontinence  . PSVT (paroxysmal supraventricular tachycardia) ([place])   . Pulmonary emboli ([place])   . Pulmonary  hypertension ([place])   . Right ventricular failure ([place])   . Sleep apnea    CPAP  . Spinal stenosis    Past Surgical History:  Procedure Laterality Date  . BLADDER SUSPENSION  1993  . CARDIAC CATHETERIZATION N/A 02/28/2015   Procedure: Right/Left Heart Cath and Coronary Angiography;  Surgeon: Troy Sine, MD;  Location: [facility]ASIVE [facility]UP[facility];  Service: Cardiovascular;  Laterality: N/A;  . CATARACT EXTRACTION Bilateral 2008  . CHOLECYSTECTOMY    . COLONOSCOPY    . COLONOSCOPY N/A 10/24/2014   Procedure: COLONOSCOPY;  Surgeon: Gatha Mayer, MD;  Location: [facility]  ENDOSCOPY;  Service: Endoscopy;  Laterality: N/A;  . ELECTROPHYSIOLOGIC STUDY N/A 03/02/2015   Procedure: SVT Ablation;  Surgeon: Evans Lance, MD;  Location: [facility] CV LAB;  Service: Cardiovascular;  Laterality: N/A;  . INCISIONAL HERNIA REPAIR    . RIGHT HEART CATHETERIZATION N/A 07/04/2014   Procedure: RIGHT HEART CATH;  Surgeon: Jolaine Artist, MD;  [name];  Service: Cardiovascular;  Laterality: N/A;  . TONSILLECTOMY AND ADENOIDECTOMY    . TOTAL KNEE ARTHROPLASTY Left 2000  . TOTAL KNEE ARTHROPLASTY Left 10/29/2012   at [facility]  . TOTAL KNEE ARTHROPLASTY Right 03/09/2014  . TOTAL KNEE REVISION  01/12/2012   Procedure: TOTAL KNEE REVISION;  Surgeon: Kerin Salen, MD;  Location: [facility];  Service: Orthopedics;  Laterality: Left;  DEPUY/ LCS , HAND SET  . TRIGGER FINGER RELEASE  01/12/2012   Procedure: RELEASE TRIGGER FINGER/A-1 PULLEY;  Surgeon: Kerin Salen, MD;  Location: [facility];  Service: Orthopedics;  Laterality: Right;   Social History  Substance Use Topics  . Smoking status: Never Smoker  . Smokeless tobacco: Never Used  . Alcohol use No   Family History  Problem Relation Age of Onset  . Breast cancer Mother   . Heart disease Father   . Diabetes Father   . Hypertension Father   . Uterine cancer Sister    Allergies  Allergen Reactions  . Zithromax [Azithromycin] Other (See Comments)    Prolonged QT on EKG  . Sulfonamide Derivatives Hives  . Adhesive [Tape] Other (See Comments) and Rash    redness  . Prednisone Other (See Comments)    Causes insomnia   Current Outpatient Prescriptions on File Prior to Visit  Medication Sig Dispense Refill  . benzonatate (TESSALON) 200 MG capsule TAKE ONE CAPSULE BY MOUTH TWICE DAILY ASNEEDED FOR COUGH 180 capsule 0  . Calcium Carb-Cholecalciferol (CALCIUM 600/VITAMIN D3) 600-800 MG-UNIT TABS Take 1 tablet by mouth 2 (two) times daily.    . cephALEXin (KEFLEX) 500 MG capsule Take 500 mg by mouth 3 (three) times daily.      . Cholecalciferol (VITAMIN D-1000 MAX ST) 1000 UNITS tablet Take 1,000 Units by mouth daily.    . hydrochlorothiazide (HYDRODIURIL) 25 MG tablet Take 1 tablet (25 mg total) by mouth daily. 90 tablet 3  . metoprolol tartrate (LOPRESSOR) 25 MG tablet Take 0.5 tablets (12.5 mg total) by mouth 2 (two) times daily. 90 tablet 3  . Multiple Vitamin (MULTIVITAMIN WITH MINERALS) TABS tablet Take 1 tablet by mouth daily.    . naproxen sodium (ANAPROX) 220 MG tablet Take 220 mg by mouth daily as needed (pain).     . polyethylene glycol (MIRALAX / GLYCOLAX) packet Take 17 g by mouth daily.     . rifampin (RIFADIN) 300 MG capsule Take 300 mg by mouth 2 (two) times daily.     No  current facility-administered medications on file prior to visit.      Review of Systems Review of Systems  Constitutional: Negative for fever, appetite change, fatigue and unexpected weight change.  Eyes: Negative for pain and visual disturbance.  Respiratory: Negative for wheeze  and shortness of breath.   Cardiovascular: Negative for cp or palpitations   pos for pedal edema  Gastrointestinal: Negative for nausea, diarrhea and constipation.  Genitourinary: Negative for urgency and frequency. pos for urinary incontinence  Skin: Negative for pallor and pos for perineal rash  Neurological: Negative for weakness, light-headedness, numbness and headaches.  Hematological: Negative for adenopathy. Does not bruise/bleed easily.  Psychiatric/Behavioral: Negative for dysphoric mood. The patient is not nervous/anxious.         Objective:   Physical Exam  Constitutional: She appears well-developed and well-nourished. No distress.  Frail appearing elderly female  HENT:  Head: Normocephalic and atraumatic.  Mouth/Throat: Oropharynx is clear and moist.  Eyes: Conjunctivae and EOM are normal. Pupils are equal, round, and reactive to light.  Neck: Normal range of motion. Neck supple. No JVD present. Carotid bruit is not present. No  thyromegaly present.  Cardiovascular: Normal rate, regular rhythm, normal heart sounds and intact distal pulses.  Exam reveals no gallop.   Pulmonary/Chest: Effort normal and breath sounds normal. No respiratory distress. She has no wheezes. She has no rales.  No crackles  Abdominal: Soft. Bowel sounds are normal. She exhibits no distension, no abdominal bruit and no mass. There is no tenderness.  Genitourinary:  Genitourinary Comments: No vaginal d/c  Musculoskeletal: She exhibits no edema.  Baseline knee swelling   Lymphadenopathy:    She has no cervical adenopathy.  Neurological: She is alert. She has normal reflexes.  Skin: Skin is warm and dry. Rash noted. There is erythema.  Erythema with some small areas of skin cracking under pannus and in perineal area (creases lateral to labia majora) No satellite lesions No excoriations  Psychiatric: She has a normal mood and affect.  Not tearful Candid about symptoms  Insight seems good   Supportive daughter is present          Assessment & Plan:   Problem List Items Addressed This Visit      Cardiovascular and Mediastinum   Essential hypertension    bp is running on the low side (rev log) but no symptoms Her daughter will be on the look out for any orthostatic symptoms and let me know         Musculoskeletal and Integument   Intertrigo    Suspect candidal intertrigo in pt with chronic incontinence of urine inst to keep areas dry/ stop barrier creams tx with ketoconazole cream bid prn  Update if worse or no imp          Other   Stress reaction    Reviewed stressors/ coping techniques/symptoms/ support sources/ tx options and side effects in detail today For the most part episodes of tearfulness are brief and tolerable Pt does not think she needs medication  Will ref to counseling  Overall fairly good insight and great support       Relevant Orders   Ambulatory referral to Psychology   Chronic cough - Primary     Ongoing Has PFT and pulm f/u planned in oct  Will continue delsym and pepcid until then        Other Visit Diagnoses   None.     Problem List Items Addressed This Visit  Cardiovascular and Mediastinum   Essential hypertension    bp is running on the low side (rev log) but no symptoms Her daughter will be on the look out for any orthostatic symptoms and let me know         Musculoskeletal and Integument   Intertrigo    Suspect candidal intertrigo in pt with chronic incontinence of urine inst to keep areas dry/ stop barrier creams tx with ketoconazole cream bid prn  Update if worse or no imp          Other   Stress reaction    Reviewed stressors/ coping techniques/symptoms/ support sources/ tx options and side effects in detail today For the most part episodes of tearfulness are brief and tolerable Pt does not think she needs medication  Will ref to counseling  Overall fairly good insight and great support       Relevant Orders   Ambulatory referral to Psychology   Chronic cough - Primary    Ongoing Has PFT and pulm f/u planned in oct  Will continue delsym and pepcid until then        Other Visit Diagnoses   None.

## 2016-06-04 NOTE — Assessment & Plan Note (Signed)
Ongoing Has PFT and pulm f/u planned in oct  Will continue delsym and pepcid until then

## 2016-06-04 NOTE — Progress Notes (Signed)
Pre visit review using our clinic review tool, if applicable. No additional management support is needed unless otherwise documented below in the visit note. 

## 2016-06-04 NOTE — Assessment & Plan Note (Signed)
Suspect candidal intertrigo in pt with chronic incontinence of urine inst to keep areas dry/ stop barrier creams tx with ketoconazole cream bid prn  Update if worse or no imp

## 2016-06-04 NOTE — Assessment & Plan Note (Signed)
bp is running on the low side (rev log) but no symptoms Her daughter will be on the look out for any orthostatic symptoms and let me know

## 2016-06-04 NOTE — Patient Instructions (Signed)
Follow up with pulmonary for PFTs and visit as planned  Keep the rash areas clean and as dry as possible - dry with a hair dryer on the cool setting if needed  I think this is yeast Try the ketoconazole cream  Update me if no improvement  Stop at check out for referral to counselor I think you are dealing with stress very well  If you develop symptoms from low blood pressure (dizziness) - let me know

## 2016-06-04 NOTE — Telephone Encounter (Signed)
Met with pt and her daughter. They are aware pt was placed on [facility] WQ. They are aware they will be called to schedule with someone here at [facility] or they can call to get scheduled.

## 2016-06-04 NOTE — Assessment & Plan Note (Signed)
Reviewed stressors/ coping techniques/symptoms/ support sources/ tx options and side effects in detail today For the most part episodes of tearfulness are brief and tolerable Pt does not think she needs medication  Will ref to counseling  Overall fairly good insight and great support

## 2016-06-09 ENCOUNTER — Encounter: Payer: Self-pay | Admitting: Adult Health

## 2016-06-10 ENCOUNTER — Ambulatory Visit: Payer: Medicare Other | Admitting: Family Medicine

## 2016-06-10 DIAGNOSIS — T8189XA Other complications of procedures, not elsewhere classified, initial encounter: Secondary | ICD-10-CM | POA: Diagnosis not present

## 2016-06-17 DIAGNOSIS — T8189XA Other complications of procedures, not elsewhere classified, initial encounter: Secondary | ICD-10-CM | POA: Diagnosis not present

## 2016-06-20 ENCOUNTER — Ambulatory Visit (INDEPENDENT_AMBULATORY_CARE_PROVIDER_SITE_OTHER): Payer: Medicare Other

## 2016-06-20 VITALS — BP 102/62 | HR 51 | Temp 97.7°F | Ht 65.75 in | Wt 203.5 lb

## 2016-06-20 DIAGNOSIS — I10 Essential (primary) hypertension: Secondary | ICD-10-CM

## 2016-06-20 DIAGNOSIS — Z0000 Encounter for general adult medical examination without abnormal findings: Secondary | ICD-10-CM

## 2016-06-20 DIAGNOSIS — F329 Major depressive disorder, single episode, unspecified: Secondary | ICD-10-CM | POA: Diagnosis not present

## 2016-06-20 DIAGNOSIS — E785 Hyperlipidemia, unspecified: Secondary | ICD-10-CM | POA: Diagnosis not present

## 2016-06-20 DIAGNOSIS — R5383 Other fatigue: Secondary | ICD-10-CM | POA: Diagnosis not present

## 2016-06-20 DIAGNOSIS — E559 Vitamin D deficiency, unspecified: Secondary | ICD-10-CM | POA: Diagnosis not present

## 2016-06-20 DIAGNOSIS — F32A Depression, unspecified: Secondary | ICD-10-CM

## 2016-06-20 LAB — COMPREHENSIVE METABOLIC PANEL
ALK PHOS: 64 U/L (ref 39–117)
ALT: 13 U/L (ref 0–35)
AST: 18 U/L (ref 0–37)
Albumin: 3.6 g/dL (ref 3.5–5.2)
BILIRUBIN TOTAL: 0.6 mg/dL (ref 0.2–1.2)
BUN: 23 mg/dL (ref 6–23)
CALCIUM: 9.4 mg/dL (ref 8.4–10.5)
CO2: 34 meq/L — AB (ref 19–32)
Chloride: 102 mEq/L (ref 96–112)
Creatinine, Ser: 0.92 mg/dL (ref 0.40–1.20)
GFR: 62.33 mL/min (ref >60.00)
Glucose, Bld: 109 mg/dL — ABNORMAL HIGH (ref 70–99)
Potassium: 3.8 mEq/L (ref 3.5–5.1)
Sodium: 141 mEq/L (ref 135–145)
Total Protein: 7.1 g/dL (ref 6.0–8.3)

## 2016-06-20 LAB — LIPID PANEL
CHOL/HDL RATIO: 3
CHOLESTEROL: 162 mg/dL (ref 0–200)
HDL: 52.9 mg/dL (ref >39.00)
LDL Cholesterol: 87 mg/dL (ref 0–99)
NonHDL: 109.21
TRIGLYCERIDES: 109 mg/dL (ref 0.0–149.0)
VLDL: 21.8 mg/dL (ref 0.0–40.0)

## 2016-06-20 LAB — CBC WITH DIFFERENTIAL/PLATELET
BASOS ABS: 0 10*3/uL (ref 0.0–0.1)
BASOS PCT: 0.4 % (ref 0.0–3.0)
EOS PCT: 4.6 % (ref 0.0–5.0)
Eosinophils Absolute: 0.2 10*3/uL (ref 0.0–0.7)
HEMATOCRIT: 41 % (ref 36.0–46.0)
Hemoglobin: 14 g/dL (ref 12.0–15.0)
LYMPHS PCT: 17.6 % (ref 12.0–46.0)
Lymphs Abs: 0.9 10*3/uL (ref 0.7–4.0)
MCHC: 34.2 g/dL (ref 30.0–36.0)
MCV: 88.7 fl (ref 78.0–100.0)
MONOS PCT: 13.3 % — AB (ref 3.0–12.0)
Monocytes Absolute: 0.7 10*3/uL (ref 0.1–1.0)
NEUTROS ABS: 3.3 10*3/uL (ref 1.4–7.7)
Neutrophils Relative %: 64.1 % (ref 43.0–77.0)
PLATELETS: 224 10*3/uL (ref 150.0–400.0)
RBC: 4.62 Mil/uL (ref 3.87–5.11)
RDW: 14.5 % (ref 11.5–15.5)
WBC: 5.2 10*3/uL (ref 4.0–10.5)

## 2016-06-20 LAB — VITAMIN D 25 HYDROXY (VIT D DEFICIENCY, FRACTURES): VITD: 44.44 ng/mL (ref 30.00–100.00)

## 2016-06-20 LAB — TSH: TSH: 6.33 u[IU]/mL — ABNORMAL HIGH (ref 0.35–4.50)

## 2016-06-20 NOTE — Progress Notes (Signed)
Subjective:   [name] is a 82 y.o. female who presents for Medicare Annual (Subsequent) preventive examination.  Review of Systems:  N/A Cardiac Risk Factors include: advanced age (>89men, >71 women);obesity (BMI >30kg/m2);hypertension;sedentary lifestyle     Objective:     Vitals: BP 102/62 (BP Location: Left Arm, Patient Position: Sitting, Cuff Size: Normal)   Pulse (!) 51   Temp 97.7 F (36.5 C) (Oral)   Ht 5' 5.75" (1.67 m) Comment: no shoes  Wt 203 lb 8 oz (92.3 kg)   SpO2 97%   BMI 33.10 kg/m   Body mass index is 33.1 kg/m.   Tobacco History  Smoking Status  . Never Smoker  Smokeless Tobacco  . Never Used     Counseling given: No   Past Medical History:  Diagnosis Date  . Anemia   . Arthritis    knee  . Diverticulosis   . Hx of colonic polyps 10/24/2014  . Hyperlipidemia   . Hypertension   . Osteopenia   . Overactive bladder    urge incontinence  . PSVT (paroxysmal supraventricular tachycardia) ([place])   . Pulmonary emboli ([place])   . Pulmonary hypertension ([place])   . Right ventricular failure ([place])   . Sleep apnea    CPAP  . Spinal stenosis    Past Surgical History:  Procedure Laterality Date  . BLADDER SUSPENSION  1993  . CARDIAC CATHETERIZATION N/A 02/28/2015   Procedure: Right/Left Heart Cath and Coronary Angiography;  Surgeon: Troy Sine, MD;  Location: [facility];  Service: Cardiovascular;  Laterality: N/A;  . CATARACT EXTRACTION Bilateral 2008  . CHOLECYSTECTOMY    . COLONOSCOPY    . COLONOSCOPY N/A 10/24/2014   Procedure: COLONOSCOPY;  Surgeon: Gatha Mayer, MD;  Location: [facility];  Service: Endoscopy;  Laterality: N/A;  . CYST REMOVAL LEG Left 02/15/2016   cyst removal from back of left knee  . ELECTROPHYSIOLOGIC STUDY N/A 03/02/2015   Procedure: SVT Ablation;  Surgeon: Evans Lance, MD;  Location: [facility];  Service: Cardiovascular;  Laterality: N/A;  . INCISIONAL HERNIA REPAIR    . RIGHT HEART  CATHETERIZATION N/A 07/04/2014   Procedure: RIGHT HEART CATH;  Surgeon: Jolaine Artist, MD;  Location: [facility];  Service: Cardiovascular;  Laterality: N/A;  . TONSILLECTOMY AND ADENOIDECTOMY    . TOTAL KNEE ARTHROPLASTY Left 2000  . TOTAL KNEE ARTHROPLASTY Left 10/29/2012   at [facility]  . TOTAL KNEE ARTHROPLASTY Right 03/09/2014  . TOTAL KNEE REVISION  01/12/2012   Procedure: TOTAL KNEE REVISION;  Surgeon: Kerin Salen, MD;  Location: [facility];  Service: Orthopedics;  Laterality: Left;  DEPUY/ LCS , HAND SET  . TRIGGER FINGER RELEASE  01/12/2012   Procedure: RELEASE TRIGGER FINGER/A-1 PULLEY;  Surgeon: Kerin Salen, MD;  Location: [facility];  Service: Orthopedics;  Laterality: Right;   Family History  Problem Relation Age of Onset  . Breast cancer Mother   . Heart disease Father   . Diabetes Father   . Hypertension Father   . Uterine cancer Sister    History  Sexual Activity  . Sexual activity: No    Outpatient Encounter Prescriptions as of 06/20/2016  Medication Sig  . benzonatate (TESSALON) 200 MG capsule TAKE ONE CAPSULE BY MOUTH TWICE DAILY ASNEEDED FOR COUGH  . Calcium Carb-Cholecalciferol (CALCIUM 600/VITAMIN D3) 600-800 MG-UNIT TABS Take 1 tablet by mouth 2 (two) times daily.  . cephALEXin (KEFLEX) 500 MG capsule Take 500  mg by mouth 3 (three) times daily.  . Cholecalciferol (VITAMIN D-1000 MAX ST) 1000 UNITS tablet Take 1,000 Units by mouth daily.  . hydrochlorothiazide (HYDRODIURIL) 25 MG tablet Take 1 tablet (25 mg total) by mouth daily.  . metoprolol tartrate (LOPRESSOR) 25 MG tablet Take 0.5 tablets (12.5 mg total) by mouth 2 (two) times daily.  . Multiple Vitamin (MULTIVITAMIN WITH MINERALS) TABS tablet Take 1 tablet by mouth daily.  . naproxen sodium (ANAPROX) 220 MG tablet Take 220 mg by mouth daily as needed (pain).   . polyethylene glycol (MIRALAX / GLYCOLAX) packet Take 17 g by mouth daily.   . rifampin (RIFADIN) 300 MG capsule Take 300 mg by mouth 2 (two) times daily.    Marland Kitchen ketoconazole (NIZORAL) 2 % cream Apply 1 application topically 2 (two) times daily as needed for irritation. To affected areas in skin folds (Patient not taking: Reported on 06/20/2016)   No facility-administered encounter medications on file as of 06/20/2016.     Activities of Daily Living In your present state of health, do you have any difficulty performing the following activities: 06/20/2016  Hearing? N  Vision? N  Difficulty concentrating or making decisions? N  Walking or climbing stairs? Y  Dressing or bathing? N  Doing errands, shopping? Y  Preparing Food and eating ? N  Using the Toilet? N  In the past six months, have you accidently leaked urine? Y  Do you have problems with loss of bowel control? Y  Managing your Medications? Y  Managing your Finances? Y  Housekeeping or managing your Housekeeping? Y  Some recent data might be hidden    Patient Care Team: Abner Greenspan, MD as PCP - General    Assessment:     Hearing Screening   125Hz  250Hz  500Hz  1000Hz  2000Hz  3000Hz  4000Hz  6000Hz  8000Hz   Right ear:   0 0 40  40    Left ear:   0 0 0  40    Vision Screening Comments: Last vision exam in 2017 with Dr. Kathrin Penner   Exercise Activities and Dietary recommendations Current Exercise Habits: The patient does not participate in regular exercise at present, Exercise limited by: orthopedic condition(s)  Goals    . Increase water intake          Starting 06/20/2016, I will attempt to drink at least 6-8 glasses of water daily.       Fall Risk Fall Risk  06/20/2016 07/14/2014 02/08/2013  Falls in the past year? Yes No No  Number falls in past yr: 1 - -  Injury with Fall? No - -  Risk for fall due to : History of fall(s);Impaired balance/gait;Impaired mobility - -  Follow up Falls evaluation completed - -   Depression Screen PHQ 2/9 Scores 06/20/2016 07/14/2014 02/08/2013  PHQ - 2 Score 1 0 0     Cognitive Testing MMSE - Mini Mental State Exam 06/20/2016  Orientation  to time 5  Orientation to Place 5  Registration 3  Attention/ Calculation 0  Recall 3  Language- name 2 objects 0  Language- repeat 1  Language- follow 3 step command 3  Language- read & follow direction 0  Write a sentence 0  Copy design 0  Total score 20   PLEASE NOTE: A Mini-Cog screen was completed. Maximum score is 20. A value of 0 denotes this part of [place] MMSE was not completed or the patient failed this part of the Mini-Cog screening.   Mini-Cog Screening Orientation to Time -  Max 5 pts Orientation to Place - Max 5 pts Registration - Max 3 pts Recall - Max 3 pts Language Repeat - Max 1 pts Language Follow 3 Step Command - Max 3 pts  Immunization History  Administered Date(s) Administered  . Influenza Split 07/29/2011  . Influenza Whole 08/14/2005, 07/28/2007, 07/14/2008, 07/26/2009, 07/16/2010  . Influenza,inj,Quad PF,36+ Mos 07/19/2013, 07/14/2014, 07/11/2015  . Pneumococcal Conjugate-13 07/14/2014  . Pneumococcal Polysaccharide-23 06/25/2006, 08/15/2010  . Td 03/27/2004, 02/20/2012  . Tdap 02/20/2012  . Zoster 11/01/2007   Screening Tests Health Maintenance  Topic Date Due  . INFLUENZA VACCINE  10/26/2016 (Originally 05/27/2016)  . MAMMOGRAM  07/17/2016  . TETANUS/TDAP  02/19/2022  . DEXA SCAN  Completed  . ZOSTAVAX  Completed  . PNA vac Low Risk Adult  Completed      Plan:     I have personally reviewed and addressed the Medicare Annual Wellness questionnaire and have noted the following in the patient's chart:  A. Medical and social history B. Use of alcohol, tobacco or illicit drugs  C. Current medications and supplements D. Functional ability and status E.  Nutritional status F.  Physical activity G. Advance directives H. List of other physicians I.  Hospitalizations, surgeries, and ER visits in previous 12 months J.  [place] to include hearing, vision, cognitive, depression L. Referrals and appointments - none  In addition, I  have reviewed and discussed with patient certain preventive protocols, quality metrics, and best practice recommendations. A written personalized care plan for preventive services as well as general preventive health recommendations were provided to patient.  See attached scanned questionnaire for additional information.   Signed,   Lindell Noe, MHA, BS, LPN Health Advisor

## 2016-06-20 NOTE — Progress Notes (Signed)
Pre visit review using our clinic review tool, if applicable. No additional management support is needed unless otherwise documented below in the visit note. 

## 2016-06-20 NOTE — Patient Instructions (Addendum)
[name] , Thank you for taking time to come for your Medicare Wellness Visit. I appreciate your ongoing commitment to your health goals. Please review the following plan we discussed and let me know if I can assist you in the future.   These are the goals we discussed: Goals    . Increase water intake          Starting 06/20/2016, I will attempt to drink at least 6-8 glasses of water daily.        This is a list of the screening recommended for you and due dates:  Health Maintenance  Topic Date Due  . Flu Shot  10/26/2016*  . Mammogram  07/17/2016  . Tetanus Vaccine  02/19/2022  . DEXA scan (bone density measurement)  Completed  . Shingles Vaccine  Completed  . Pneumonia vaccines  Completed  *Topic was postponed. The date shown is not the original due date.   Preventive Care for Adults  A healthy lifestyle and preventive care can promote health and wellness. Preventive health guidelines for adults include the following key practices.  . A routine yearly physical is a good way to check with your health care provider about your health and preventive screening. It is a chance to share any concerns and updates on your health and to receive a thorough exam.  . Visit your dentist for a routine exam and preventive care every 6 months. Brush your teeth twice a day and floss once a day. Good oral hygiene prevents tooth decay and gum disease.  . The frequency of eye exams is based on your age, health, family medical history, use  of contact lenses, and other factors. Follow your health care provider's ecommendations for frequency of eye exams.  . Eat a healthy diet. Foods like vegetables, fruits, whole grains, low-fat dairy products, and lean protein foods contain the nutrients you need without too many calories. Decrease your intake of foods high in solid fats, added sugars, and salt. Eat the right amount of calories for you. Get information about a proper diet from your health care  provider, if necessary.  . Regular physical exercise is one of the most important things you can do for your health. Most adults should get at least 150 minutes of moderate-intensity exercise (any activity that increases your heart rate and causes you to sweat) each week. In addition, most adults need muscle-strengthening exercises on 2 or more days a week.  Silver Sneakers may be a benefit available to you. To determine eligibility, you may visit the website: www.silversneakers.com or contact program at 254 363 1725 Mon-Fri between 8AM-8PM.   . Maintain a healthy weight. The body mass index (BMI) is a screening tool to identify possible weight problems. It provides an estimate of body fat based on height and weight. Your health care provider can find your BMI and can help you achieve or maintain a healthy weight.   For adults 20 years and older: ? A BMI below 18.5 is considered underweight. ? A BMI of 18.5 to 24.9 is normal. ? A BMI of 25 to 29.9 is considered overweight. ? A BMI of 30 and above is considered obese.   . Maintain normal blood lipids and cholesterol levels by exercising and minimizing your intake of saturated fat. Eat a balanced diet with plenty of fruit and vegetables. Blood tests for lipids and cholesterol should begin at age 26 and be repeated every 5 years. If your lipid or cholesterol levels are high, you are over  50, or you are at high risk for heart disease, you may need your cholesterol levels checked more frequently. Ongoing high lipid and cholesterol levels should be treated with medicines if diet and exercise are not working.  . If you smoke, find out from your health care provider how to quit. If you do not use tobacco, please do not start.  . If you choose to drink alcohol, please do not consume more than 2 drinks per day. One drink is considered to be 12 ounces (355 mL) of beer, 5 ounces (148 mL) of wine, or 1.5 ounces (44 mL) of liquor.  . If you are 63-79 years  old, ask your health care provider if you should take aspirin to prevent strokes.  . Use sunscreen. Apply sunscreen liberally and repeatedly throughout the day. You should seek shade when your shadow is shorter than you. Protect yourself by wearing long sleeves, pants, a wide-brimmed hat, and sunglasses year round, whenever you are outdoors.  . Once a month, do a whole body skin exam, using a mirror to look at the skin on your back. Tell your health care provider of new moles, moles that have irregular borders, moles that are larger than a pencil eraser, or moles that have changed in shape or color.        Fall Prevention in the Home  Falls can cause injuries. They can happen to people of all ages. There are many things you can do to make your home safe and to help prevent falls.  WHAT CAN I DO ON THE OUTSIDE OF MY HOME?  Regularly fix the edges of walkways and driveways and fix any cracks.  Remove anything that might make you trip as you walk through a door, such as a raised step or threshold.  Trim any bushes or trees on the path to your home.  Use bright outdoor lighting.  Clear any walking paths of anything that might make someone trip, such as rocks or tools.  Regularly check to see if handrails are loose or broken. Make sure that both sides of any steps have handrails.  Any raised decks and porches should have guardrails on the edges.  Have any leaves, snow, or ice cleared regularly.  Use sand or salt on walking paths during winter.  Clean up any spills in your garage right away. This includes oil or grease spills. WHAT CAN I DO IN THE BATHROOM?   Use night lights.  Install grab bars by the toilet and in the tub and shower. Do not use towel bars as grab bars.  Use non-skid mats or decals in the tub or shower.  If you need to sit down in the shower, use a plastic, non-slip stool.  Keep the floor dry. Clean up any water that spills on the floor as soon as it  happens.  Remove soap buildup in the tub or shower regularly.  Attach bath mats securely with double-sided non-slip rug tape.  Do not have throw rugs and other things on the floor that can make you trip. WHAT CAN I DO IN THE BEDROOM?  Use night lights.  Make sure that you have a light by your bed that is easy to reach.  Do not use any sheets or blankets that are too big for your bed. They should not hang down onto the floor.  Have a firm chair that has side arms. You can use this for support while you get dressed.  Do not have throw rugs  and other things on the floor that can make you trip. WHAT CAN I DO IN THE KITCHEN?  Clean up any spills right away.  Avoid walking on wet floors.  Keep items that you use a lot in easy-to-reach places.  If you need to reach something above you, use a strong step stool that has a grab bar.  Keep electrical cords out of the way.  Do not use floor polish or wax that makes floors slippery. If you must use wax, use non-skid floor wax.  Do not have throw rugs and other things on the floor that can make you trip. WHAT CAN I DO WITH MY STAIRS?  Do not leave any items on the stairs.  Make sure that there are handrails on both sides of the stairs and use them. Fix handrails that are broken or loose. Make sure that handrails are as long as the stairways.  Check any carpeting to make sure that it is firmly attached to the stairs. Fix any carpet that is loose or worn.  Avoid having throw rugs at the top or bottom of the stairs. If you do have throw rugs, attach them to the floor with carpet tape.  Make sure that you have a light switch at the top of the stairs and the bottom of the stairs. If you do not have them, ask someone to add them for you. WHAT ELSE CAN I DO TO HELP PREVENT FALLS?  Wear shoes that:  Do not have high heels.  Have rubber bottoms.  Are comfortable and fit you well.  Are closed at the toe. Do not wear sandals.  If you  use a stepladder:  Make sure that it is fully opened. Do not climb a closed stepladder.  Make sure that both sides of the stepladder are locked into place.  Ask someone to hold it for you, if possible.  Clearly mark and make sure that you can see:  Any grab bars or handrails.  First and last steps.  Where the edge of each step is.  Use tools that help you move around (mobility aids) if they are needed. These include:  Canes.  Walkers.  Scooters.  Crutches.  Turn on the lights when you go into a dark area. Replace any light bulbs as soon as they burn out.  Set up your furniture so you have a clear path. Avoid moving your furniture around.  If any of your floors are uneven, fix them.  If there are any pets around you, be aware of where they are.  Review your medicines with your doctor. Some medicines can make you feel dizzy. This can increase your chance of falling. Ask your doctor what other things that you can do to help prevent falls.   This information is not intended to replace advice given to you by your health care provider. Make sure you discuss any questions you have with your health care provider.   Document Released: 08/09/2009 Document Revised: 02/27/2015 Document Reviewed: 11/17/2014 Elsevier Interactive Patient Education [employer].

## 2016-06-20 NOTE — Progress Notes (Signed)
PCP notes:   Health maintenance:  Flu vaccine - addressed  Abnormal screenings:   Hearing - failed Depression score: 1 Fall risk: hx of fall without injury  Patient concerns:   Pt has non-healing wound behind left knee. Pt has been to see wound care specialist. Pt reports increased in cramping when hands are cold.   Nurse concerns:  None  Next PCP appt:   06/25/16 @ 1000  I reviewed health advisor's note, was available for consultation, and agree with documentation and plan.  Loura Pardon MD

## 2016-06-23 ENCOUNTER — Other Ambulatory Visit: Payer: Self-pay | Admitting: Family Medicine

## 2016-06-23 DIAGNOSIS — Z1231 Encounter for screening mammogram for malignant neoplasm of breast: Secondary | ICD-10-CM

## 2016-06-25 ENCOUNTER — Ambulatory Visit (INDEPENDENT_AMBULATORY_CARE_PROVIDER_SITE_OTHER): Payer: Medicare Other | Admitting: Family Medicine

## 2016-06-25 ENCOUNTER — Encounter: Payer: Self-pay | Admitting: Family Medicine

## 2016-06-25 VITALS — BP 104/66 | HR 60 | Temp 97.3°F | Ht 65.75 in | Wt 203.8 lb

## 2016-06-25 DIAGNOSIS — Z8739 Personal history of other diseases of the musculoskeletal system and connective tissue: Secondary | ICD-10-CM

## 2016-06-25 DIAGNOSIS — Z0000 Encounter for general adult medical examination without abnormal findings: Secondary | ICD-10-CM

## 2016-06-25 DIAGNOSIS — Z23 Encounter for immunization: Secondary | ICD-10-CM | POA: Diagnosis not present

## 2016-06-25 DIAGNOSIS — F430 Acute stress reaction: Secondary | ICD-10-CM

## 2016-06-25 DIAGNOSIS — E785 Hyperlipidemia, unspecified: Secondary | ICD-10-CM

## 2016-06-25 DIAGNOSIS — I10 Essential (primary) hypertension: Secondary | ICD-10-CM

## 2016-06-25 DIAGNOSIS — E559 Vitamin D deficiency, unspecified: Secondary | ICD-10-CM

## 2016-06-25 DIAGNOSIS — R946 Abnormal results of thyroid function studies: Secondary | ICD-10-CM

## 2016-06-25 DIAGNOSIS — R7989 Other specified abnormal findings of blood chemistry: Secondary | ICD-10-CM

## 2016-06-25 NOTE — Patient Instructions (Addendum)
Follow up with the counselor as planned next monrh  Flu shot today  We will want to keep an eye on blood glucose  Try to scale back on sweets and carbs and eat more lean protein  TSH (thyroid test) is slightly off - let's re check it in 6-8 weeks with another confirmatory test

## 2016-06-25 NOTE — Progress Notes (Signed)
Subjective:    Patient ID: [name], female    DOB: May 29, 1934, 82 y.o.   MRN: [id number]  HPI Here for health maintenance exam and to review chronic medical problems    Pt states she is doing ok  Her knee still hurts some (when she lifts her leg)- still draining quite a bit also   Pt had her AMW visit on 8/25 Failed  Hearing exam = pt was not aware of that  Family does not notice  She does not want to pursue it further   Depression score was 1 (she was ref to counselor at visit with me prior) Still has some depressed/tearful spells but not as often Felt anxious briefly this am (vitals were fine)  Counseling is not until next months   Hx of fall w/o injury-she was counseled on this  Also trying to get more protein    Wt Readings from Last 3 Encounters:  06/25/16 203 lb 12 oz (92.4 kg)  06/20/16 203 lb 8 oz (92.3 kg)  06/04/16 203 lb (92.1 kg)   bmi is 33.1  Flu shot -will get it today   Mammogram 9/16-normal= already has it scheduled for 9/22 Self breast exam-no lumps   dexa 10/13-in the normal range  D level is 44.4 Taking her calcium and vitamin D twice daily  Not a lot of exercise - a bit of walking with cane/ due to her leg    Colonoscopy 12/15-polyps removed/no recall due to age   bp is stable today  No cp or palpitations or headaches or edema  No side effects to medicines  BP Readings from Last 3 Encounters:  06/25/16 104/66  06/20/16 102/62  06/04/16 106/64     Hx of hyperlipidemia Lab Results  Component Value Date   CHOL 162 06/20/2016   CHOL 104 03/14/2014   CHOL 153 02/01/2013   Lab Results  Component Value Date   HDL 52.90 06/20/2016   HDL 32 (L) 03/14/2014   HDL 49.10 02/01/2013   Lab Results  Component Value Date   LDLCALC 87 06/20/2016   LDLCALC 53 03/14/2014   LDLCALC 86 02/01/2013   Lab Results  Component Value Date   TRIG 109.0 06/20/2016   TRIG 95 03/14/2014   TRIG 90.0 02/01/2013   Lab Results  Component Value  Date   CHOLHDL 3 06/20/2016   CHOLHDL 3.3 03/14/2014   CHOLHDL 3 02/01/2013   No results found for: LDLDIRECT  Very well controlled   Glucose 109    Chemistry      Component Value Date/Time   NA 141 06/20/2016 1201   NA 141 03/12/2015   K 3.8 06/20/2016 1201   CL 102 06/20/2016 1201   CO2 34 (H) 06/20/2016 1201   BUN 23 06/20/2016 1201   BUN 25 (A) 03/12/2015   CREATININE 0.92 06/20/2016 1201   CREATININE 0.85 12/03/2012 1736   GLU 101 03/12/2015      Component Value Date/Time   CALCIUM 9.4 06/20/2016 1201   ALKPHOS 64 06/20/2016 1201   AST 18 06/20/2016 1201   ALT 13 06/20/2016 1201   BILITOT 0.6 06/20/2016 1201      Lab Results  Component Value Date   TSH 6.33 (H) 06/20/2016    No swelling in neck or trouble swallowing   Review of Systems    Review of Systems  Constitutional: Negative for fever, appetite change, fatigue and unexpected weight change.  Eyes: Negative for pain and visual disturbance.  Respiratory:  Negative for cough and shortness of breath.   Cardiovascular: Negative for cp or palpitations    Gastrointestinal: Negative for nausea, and constipation. pos for occ diarrhea with anxiety  Genitourinary: Negative for urgency and frequency.  Skin: Negative for pallor or rash   Neurological: Negative for weakness, light-headedness, numbness and headaches.  Hematological: Negative for adenopathy. Does not bruise/bleed easily.  Psychiatric/Behavioral: Negative for dysphoric mood. The patient is not nervous/anxious.      Objective:   Physical Exam  Constitutional: She appears well-developed and well-nourished. No distress.  Frail appearing elderly female- ambulates with walker Supportive daughter present  HENT:  Head: Normocephalic and atraumatic.  Right Ear: External ear normal.  Left Ear: External ear normal.  Mouth/Throat: Oropharynx is clear and moist.  Eyes: Conjunctivae and EOM are normal. Pupils are equal, round, and reactive to light. No  scleral icterus.  Neck: Normal range of motion. Neck supple. No JVD present. Carotid bruit is not present. No thyromegaly present.  Cardiovascular: Normal rate, regular rhythm, normal heart sounds and intact distal pulses.  Exam reveals no gallop.   Pulmonary/Chest: Effort normal and breath sounds normal. No respiratory distress. She has no wheezes. She exhibits no tenderness.  Abdominal: Soft. Bowel sounds are normal. She exhibits no distension, no abdominal bruit and no mass. There is no tenderness.  Genitourinary: No breast swelling, tenderness, discharge or bleeding.  Genitourinary Comments: Breast exam: No mass, nodules, thickening, tenderness, bulging, retraction, inflamation, nipple discharge or skin changes noted.  No axillary or clavicular LA.      Musculoskeletal: Normal range of motion. She exhibits no edema or tenderness.  Baseline-L knee is swollen and warm with some remaining scabs/ wound behind it remains  Lymphadenopathy:    She has no cervical adenopathy.  Neurological: She is alert. She has normal reflexes. No cranial nerve deficit. She exhibits normal muscle tone. Coordination normal.  Skin: Skin is warm and dry. No rash noted. No erythema. No pallor.  Wound behind L knee is stable/packed today  Psychiatric: She has a normal mood and affect.  Cheerful/in good spirits today          Assessment & Plan:   Problem List Items Addressed This Visit      Cardiovascular and Mediastinum   Essential hypertension    bp in fair control at this time  BP Readings from Last 1 Encounters:  06/25/16 104/66   No changes needed Disc lifstyle change with low sodium diet and exercise  Labs reviewed        Other   Stress reaction    Counseling appt upcoming next month  Symptoms of tearfulness come and go  In good spirits today      Routine general medical examination at a health care facility    Reviewed health habits including diet and exercise and skin cancer  prevention Reviewed appropriate screening tests for age  Also reviewed health mt list, fam hx and immunization status , as well as social and family history    See HPI Rev AMW Labs reviewed Follow up with the counselor as planned next monrh  Flu shot today  We will want to keep an eye on blood glucose  Try to scale back on sweets and carbs and eat more lean protein  TSH (thyroid test) is slightly off - let's re check it in 6-8 weeks with another confirmatory test       History of osteopenia    Last dexa 2013 normal  No fractures On ca  and D       Elevated TSH    Lab Results  Component Value Date   TSH 6.33 (H) 06/20/2016   Will re check in 4-6 wk with free T4 No clinical changes       Dyslipidemia    Disc goals for lipids and reasons to control them Rev labs with pt Rev low sat fat diet in detail        Other Visit Diagnoses    Vitamin D deficiency    -  Primary   Need for influenza vaccination       Relevant Orders   Flu Vaccine QUAD 36+ mos IM (Completed)

## 2016-06-25 NOTE — Progress Notes (Signed)
Pre visit review using our clinic review tool, if applicable. No additional management support is needed unless otherwise documented below in the visit note. 

## 2016-06-26 DIAGNOSIS — Z0000 Encounter for general adult medical examination without abnormal findings: Secondary | ICD-10-CM | POA: Insufficient documentation

## 2016-06-26 NOTE — Assessment & Plan Note (Signed)
Lab Results  Component Value Date   TSH 6.33 (H) 06/20/2016   Will re check in 4-6 wk with free T4 No clinical changes

## 2016-06-26 NOTE — Assessment & Plan Note (Signed)
bp in fair control at this time  BP Readings from Last 1 Encounters:  06/25/16 104/66   No changes needed Disc lifstyle change with low sodium diet and exercise  Labs reviewed

## 2016-06-26 NOTE — Assessment & Plan Note (Signed)
Counseling appt upcoming next month  Symptoms of tearfulness come and go  In good spirits today

## 2016-06-26 NOTE — Assessment & Plan Note (Signed)
Disc goals for lipids and reasons to control them Rev labs with pt Rev low sat fat diet in detail   

## 2016-06-26 NOTE — Assessment & Plan Note (Signed)
Reviewed health habits including diet and exercise and skin cancer prevention Reviewed appropriate screening tests for age  Also reviewed health mt list, fam hx and immunization status , as well as social and family history    See HPI Rev AMW Labs reviewed Follow up with the counselor as planned next monrh  Flu shot today  We will want to keep an eye on blood glucose  Try to scale back on sweets and carbs and eat more lean protein  TSH (thyroid test) is slightly off - let's re check it in 6-8 weeks with another confirmatory test

## 2016-06-26 NOTE — Assessment & Plan Note (Signed)
Last dexa 2013 normal  No fractures On ca and D

## 2016-07-10 ENCOUNTER — Ambulatory Visit (INDEPENDENT_AMBULATORY_CARE_PROVIDER_SITE_OTHER): Payer: 59 | Admitting: Psychology

## 2016-07-10 DIAGNOSIS — F4321 Adjustment disorder with depressed mood: Secondary | ICD-10-CM

## 2016-07-15 ENCOUNTER — Encounter (HOSPITAL_BASED_OUTPATIENT_CLINIC_OR_DEPARTMENT_OTHER): Payer: Medicare Other | Attending: Surgery

## 2016-07-15 DIAGNOSIS — Y838 Other surgical procedures as the cause of abnormal reaction of the patient, or of later complication, without mention of misadventure at the time of the procedure: Secondary | ICD-10-CM | POA: Diagnosis not present

## 2016-07-15 DIAGNOSIS — I10 Essential (primary) hypertension: Secondary | ICD-10-CM | POA: Diagnosis not present

## 2016-07-15 DIAGNOSIS — G4730 Sleep apnea, unspecified: Secondary | ICD-10-CM | POA: Insufficient documentation

## 2016-07-15 DIAGNOSIS — I480 Paroxysmal atrial fibrillation: Secondary | ICD-10-CM | POA: Diagnosis not present

## 2016-07-15 DIAGNOSIS — M1990 Unspecified osteoarthritis, unspecified site: Secondary | ICD-10-CM | POA: Insufficient documentation

## 2016-07-15 DIAGNOSIS — Z86718 Personal history of other venous thrombosis and embolism: Secondary | ICD-10-CM | POA: Insufficient documentation

## 2016-07-15 DIAGNOSIS — Z96652 Presence of left artificial knee joint: Secondary | ICD-10-CM | POA: Insufficient documentation

## 2016-07-15 DIAGNOSIS — T8189XA Other complications of procedures, not elsewhere classified, initial encounter: Secondary | ICD-10-CM | POA: Insufficient documentation

## 2016-07-18 ENCOUNTER — Ambulatory Visit
Admission: RE | Admit: 2016-07-18 | Discharge: 2016-07-18 | Disposition: A | Discharge status: Home / Self Care | Payer: Medicare Other | Source: Ambulatory Visit | Attending: Family Medicine | Admitting: Family Medicine

## 2016-07-18 DIAGNOSIS — Z1231 Encounter for screening mammogram for malignant neoplasm of breast: Secondary | ICD-10-CM

## 2016-07-18 LAB — HM MAMMOGRAPHY

## 2016-07-23 ENCOUNTER — Encounter: Payer: Self-pay | Admitting: *Deleted

## 2016-07-28 ENCOUNTER — Telehealth: Payer: Self-pay | Admitting: Family Medicine

## 2016-07-28 NOTE — Telephone Encounter (Signed)
[facility] [name] DOB: 26-Dec-1933 Initial Comment Mother is having sharp pain in the back of the head- and itis hard to turn her head Nurse Assessment Nurse: Markus Daft, RN, Sherre Poot Date/Time (Eastern Time): 07/28/2016 10:06:08 AM Confirm and document reason for call. If symptomatic, describe symptoms. You must click the next button to save text entered. ---Caller states that Thursday or Friday c/o waking up having sharp pain in the back of the head with difficulty turning her head. It got worse, and started at the base of her skull on the right side, and then spread to a headache. If she kept her head still, then it wouldn't hurt. Not bothering her much yesterday or today. She thinks she slept wrong on it. Denies neck pain now, and no HA. No fever. Has the patient traveled out of the country within the last 30 days? ---Not Applicable Does the patient have any new or worsening symptoms? ---Yes Will a triage be completed? ---Yes Related visit to physician within the last 2 weeks? ---No Does the PT have any chronic conditions? (i.e. diabetes, asthma, etc.) ---Yes List chronic conditions. ---on meds for a leg infection in her knee - told would always have it after s/p knee's replacement and baker's cyst Is this a behavioral health or substance abuse call? ---No Guidelines Guideline Title Affirmed Question Affirmed Notes Neck Pain or Stiffness [1] Age > 83 AND [2] no history of prior similar neck pain Final Disposition User See PCP within [place], RN, Sherre Poot Comments Appt made with Dr. [name] on Wednesday October 4 at 12:15 pm. Pt had severe pain 2-3 days, no pain now. Disagree/Comply: Comply

## 2016-07-29 ENCOUNTER — Ambulatory Visit (INDEPENDENT_AMBULATORY_CARE_PROVIDER_SITE_OTHER): Payer: Medicare Other | Admitting: Psychology

## 2016-07-29 DIAGNOSIS — F4321 Adjustment disorder with depressed mood: Secondary | ICD-10-CM

## 2016-07-29 NOTE — Telephone Encounter (Signed)
Pt has appt with Dr [name] on 07/30/16 at 12:15.

## 2016-07-29 NOTE — Telephone Encounter (Signed)
I will see her then  

## 2016-07-30 ENCOUNTER — Ambulatory Visit (INDEPENDENT_AMBULATORY_CARE_PROVIDER_SITE_OTHER)
Admission: RE | Admit: 2016-07-30 | Discharge: 2016-07-30 | Disposition: A | Discharge status: Home / Self Care | Payer: Medicare Other | Source: Ambulatory Visit | Attending: Family Medicine | Admitting: Family Medicine

## 2016-07-30 ENCOUNTER — Encounter: Payer: Self-pay | Admitting: Family Medicine

## 2016-07-30 ENCOUNTER — Ambulatory Visit (INDEPENDENT_AMBULATORY_CARE_PROVIDER_SITE_OTHER): Payer: Medicare Other | Admitting: Family Medicine

## 2016-07-30 VITALS — BP 104/58 | HR 54 | Temp 98.1°F | Ht 65.75 in | Wt 203.2 lb

## 2016-07-30 DIAGNOSIS — R197 Diarrhea, unspecified: Secondary | ICD-10-CM | POA: Diagnosis not present

## 2016-07-30 DIAGNOSIS — R946 Abnormal results of thyroid function studies: Secondary | ICD-10-CM

## 2016-07-30 DIAGNOSIS — M542 Cervicalgia: Secondary | ICD-10-CM

## 2016-07-30 DIAGNOSIS — R7989 Other specified abnormal findings of blood chemistry: Secondary | ICD-10-CM

## 2016-07-30 LAB — TSH: TSH: 5.55 u[IU]/mL — ABNORMAL HIGH (ref 0.35–4.50)

## 2016-07-30 LAB — T4, FREE: FREE T4: 0.84 ng/dL (ref 0.60–1.60)

## 2016-07-30 MED ORDER — CYCLOBENZAPRINE HCL 10 MG PO TABS: 5.0000 mg | ORAL_TABLET | Freq: Three times a day (TID) | ORAL | 0 refills | Status: DC | PRN

## 2016-07-30 NOTE — Progress Notes (Signed)
Subjective:    Patient ID: [name], female    DOB: 22-Dec-1933, 82 y.o.   MRN: [id number]  HPI Here with neck pain and diarrhea   Woke up with neck pain on 9/29- hurt over the back of her neck on the R side   It sometimes radiates 1/2 way to the back of her head  Sharp pain moreso than achey  Very deep pain  No rash  Can flex and extend  Hurts to tilt right Rotating hurts more on the right as well  No radiation to arm or hand  No numbness or tinging   She took aleve for a while - helps some but does not get get rid of it  Tried heat - she uses a heating pad - helps some   No injury or trauma known  Sleeps with 2 pillows (was told to)- she would prefer just one  Says she breathes fine flat  They are feather and foam pillows    Has hx of CS disc dz   Diarrhea - she has bouts of it  Sometimes urgency of stool and cannot get to the bathroom at times  This comes and goes (some days)  When she has it - mostly liquid stool  No blood in stool  No mucous that she notes  Daughter gives her immodium prn and it helps (just one dose at a time)  Does not feel constipated  Appetite is generally not good  No fever  No abdominal pain  Urgency but no cramping  Not on any magnesium Takes mvi and also ca and D   BP: (!) 104/58   Is on cefazolin and rifampin for a knee infection chronically  Patient Active Problem List   Diagnosis Date Noted  . Neck pain 07/30/2016  . Diarrhea 07/30/2016  . Routine general medical examination at a health care facility 06/26/2016  . Elevated TSH 06/25/2016  . Stress reaction 06/04/2016  . Intertrigo 06/04/2016  . Chronic cough 05/01/2016  . Constipation 01/09/2016  . Umbilical hernia [id number]  . Chronic respiratory failure ([place]) 10/05/2015  . Thromboembolism of vein 10/02/2015  . Infection of prosthetic joint ([place]) 09/18/2015  . Infection of prosthetic left knee joint ([place]) 09/12/2015  . Effusion of knee 08/30/2015  . Pulmonary  hypertension   . Physical deconditioning 02/25/2015  . RBBB-new 02/12/2015  . History of bilat PE after TKR (off Coumadin Dec 2015) 02/12/2015  . Abnormal EKG-TWI, RBBB, prolonged QT   . Hernia, umbilical [id number]  . Hx of colonic polyps 10/24/2014  . Diverticulosis of colon without hemorrhage   . OSA (obstructive sleep apnea) 07/22/2014  . Unspecified arthropathy, lower leg 03/21/2014  . History of right knee joint replacement 03/15/2014  . Arthritis of knee 03/09/2014  . Solitary pulmonary nodule 05/03/2013  . Sternal fracture 04/26/2013  . PE (pulmonary embolism) 11/24/2012  . History of DVT (deep vein thrombosis) 11/24/2012  . History of artificial joint 10/29/2012  . 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 BP (high blood pressure) 10/14/2012  . Complications due to internal joint prosthesis ([place]) 09/07/2012  . PES PLANUS 08/15/2010  . URINARY FREQUENCY, CHRONIC 07/14/2008  . Dyslipidemia 07/09/2007  . Essential hypertension 07/09/2007  . PSVT with SSS 02/23/2007  . SCLERODERMA 02/23/2007  . DEGENERATIVE JOINT DISEASE 02/23/2007  . SPINAL STENOSIS, LUMBAR 02/23/2007  . History of osteopenia 02/23/2007  . SPONDYLOLISTHESIS 02/23/2007   Past Medical History:  Diagnosis Date  . Anemia   . Arthritis  knee  . Diverticulosis   . Hx of colonic polyps 10/24/2014  . Hyperlipidemia   . Hypertension   . Osteopenia   . Overactive bladder    urge incontinence  . PSVT (paroxysmal supraventricular tachycardia) ([place])   . Pulmonary emboli ([place])   . Pulmonary hypertension   . Right ventricular failure ([place])   . Sleep apnea    CPAP  . Spinal stenosis    Past Surgical History:  Procedure Laterality Date  . BLADDER SUSPENSION  1993  . CARDIAC CATHETERIZATION N/A 02/28/2015   Procedure: Right/Left Heart Cath and Coronary Angiography;  Surgeon: Troy Sine, MD;  Location: [facility];  Service: Cardiovascular;  Laterality: N/A;  . CATARACT EXTRACTION  Bilateral 2008  . CHOLECYSTECTOMY    . COLONOSCOPY    . COLONOSCOPY N/A 10/24/2014   Procedure: COLONOSCOPY;  Surgeon: Gatha Mayer, MD;  Location: [facility] ENDOSCOPY;  Service: Endoscopy;  Laterality: N/A;  . CYST REMOVAL LEG Left 02/15/2016   cyst removal from back of left knee  . ELECTROPHYSIOLOGIC STUDY N/A 03/02/2015   Procedure: SVT Ablation;  Surgeon: Evans Lance, MD;  Location: [facility] CV [facility];  Service: Cardiovascular;  Laterality: N/A;  . INCISIONAL HERNIA REPAIR    . RIGHT HEART CATHETERIZATION N/A 07/04/2014   Procedure: RIGHT HEART CATH;  Surgeon: Jolaine Artist, MD;  Location: [facility];  Service: Cardiovascular;  Laterality: N/A;  . TONSILLECTOMY AND ADENOIDECTOMY    . TOTAL KNEE ARTHROPLASTY Left 2000  . TOTAL KNEE ARTHROPLASTY Left 10/29/2012   at [facility]  . TOTAL KNEE ARTHROPLASTY Right 03/09/2014  . TOTAL KNEE REVISION  01/12/2012   Procedure: TOTAL KNEE REVISION;  Surgeon: Kerin Salen, MD;  Location: [facility];  Service: Orthopedics;  Laterality: Left;  DEPUY/ LCS , HAND SET  . TRIGGER FINGER RELEASE  01/12/2012   Procedure: RELEASE TRIGGER FINGER/A-1 PULLEY;  Surgeon: Kerin Salen, MD;  Location: [facility];  Service: Orthopedics;  Laterality: Right;   Social History  Substance Use Topics  . Smoking status: Never Smoker  . Smokeless tobacco: Never Used  . Alcohol use No   Family History  Problem Relation Age of Onset  . Breast cancer Mother   . Heart disease Father   . Diabetes Father   . Hypertension Father   . Uterine cancer Sister    Allergies  Allergen Reactions  . Zithromax [Azithromycin] Other (See Comments)    Prolonged QT on EKG  . Sulfonamide Derivatives Hives  . Adhesive [Tape] Other (See Comments) and Rash    redness  . Prednisone Other (See Comments)    Causes insomnia   Current Outpatient Prescriptions on File Prior to Visit  Medication Sig Dispense Refill  . benzonatate (TESSALON) 200 MG capsule TAKE ONE CAPSULE BY MOUTH TWICE DAILY ASNEEDED  FOR COUGH (Patient taking differently: 2 (two) times daily. TAKE ONE CAPSULE BY MOUTH TWICE DAILY ASNEEDED FOR COUGH) 180 capsule 0  . Calcium Carb-Cholecalciferol (CALCIUM 600/VITAMIN D3) 600-800 MG-UNIT TABS Take 1 tablet by mouth 2 (two) times daily.    . cephALEXin (KEFLEX) 500 MG capsule Take 500 mg by mouth 3 (three) times daily.    . Cholecalciferol (VITAMIN D-1000 MAX ST) 1000 UNITS tablet Take 1,000 Units by mouth daily.    . hydrochlorothiazide (HYDRODIURIL) 25 MG tablet Take 1 tablet (25 mg total) by mouth daily. 90 tablet 3  . ketoconazole (NIZORAL) 2 % cream Apply 1 application topically 2 (two) times daily as needed  for irritation. To affected areas in skin folds 30 g 2  . metoprolol tartrate (LOPRESSOR) 25 MG tablet Take 0.5 tablets (12.5 mg total) by mouth 2 (two) times daily. 90 tablet 3  . Multiple Vitamin (MULTIVITAMIN WITH MINERALS) TABS tablet Take 1 tablet by mouth daily.    . naproxen sodium (ANAPROX) 220 MG tablet Take 220 mg by mouth daily as needed (pain).     . polyethylene glycol (MIRALAX / GLYCOLAX) packet Take 17 g by mouth daily.     . rifampin (RIFADIN) 300 MG capsule Take 300 mg by mouth 2 (two) times daily.     No current facility-administered medications on file prior to visit.      Review of Systems    Review of Systems  Constitutional: Negative for fever, appetite change, fatigue and unexpected weight change.  Eyes: Negative for pain and visual disturbance.  Respiratory: Negative for cough and shortness of breath.   Cardiovascular: Negative for cp or palpitations    Gastrointestinal: Negative for nausea,  and constipation. neg for blood in stool or dark stool  Genitourinary: Negative for urgency and frequency.  Skin: Negative for pallor or rash   MSK pos for neck pain  Neurological: Negative for weakness, light-headedness, numbness and headaches.  Hematological: Negative for adenopathy. Does not bruise/bleed easily.  Psychiatric/Behavioral: Negative  for dysphoric mood. The patient is not nervous/anxious.      Objective:   Physical Exam  Constitutional: She appears well-developed and well-nourished. No distress.  Well appearing - mildly uncomfortable from neck pain   HENT:  Head: Normocephalic and atraumatic.  Mouth/Throat: Oropharynx is clear and moist.  Eyes: Conjunctivae and EOM are normal. Pupils are equal, round, and reactive to light. No scleral icterus.  Neck: Normal range of motion. Neck supple.  Cardiovascular: Normal rate, regular rhythm and normal heart sounds.   Pulmonary/Chest: Effort normal and breath sounds normal. No respiratory distress. She has no wheezes. She has no rales.  Abdominal: Soft. Bowel sounds are normal. She exhibits no distension and no mass. There is no tenderness. There is no rebound and no guarding.  Musculoskeletal:       Cervical back: She exhibits decreased range of motion, tenderness and spasm. She exhibits no bony tenderness, no edema and no deformity.  Palpable spasm of R cervical musculature  Tender as well/also mild tenderness of R trapezius   Nl flex and ext Pain with R rotation   Scant crepitus  No neuro changes   Lymphadenopathy:    She has no cervical adenopathy.  Neurological: She is alert. She has normal strength. She displays no atrophy. No cranial nerve deficit or sensory deficit. She exhibits normal muscle tone. Coordination normal.  Skin: Skin is warm and dry. No rash noted. No erythema. No pallor.  Psychiatric: She has a normal mood and affect.          Assessment & Plan:   Problem List Items Addressed This Visit      Other   Diarrhea    Intermittent - could be IBS Enc to keep a journal of diet when symptoms happen  In light of chronic abx - also will check for c diff if possible - she will bring a sample back  No abd pain / reassuring exam      Relevant Orders   C. difficile GDH and Toxin A/B   Elevated TSH    Lab for re check today      Neck pain     Spasm  on exam  Neck films today-pending Pt has hx of cervical disc dz -no neuro symptoms today Px flexeril  Adv use of heat and a cervical supp pillow  Will update with response       Relevant Orders   DG Cervical Spine Complete (Completed)    Other Visit Diagnoses   None.

## 2016-07-30 NOTE — Patient Instructions (Addendum)
Xray of neck today  Labs today  Try the flexeril (muscle relaxer) as needed- watch out for sedation  Aleve is ok if you take it with food up to twice daily  Use heat- 10 minutes at a time- don't sleep on a heating pad  Put the foam pillow on top or use it alone for better neck support  Update if not starting to improve in a week or if worsening    For diarrhea- the next time you have a liquid stool - get a sample and bring it back for a c diff test  If you develop fever or abdominal pain let us know  Keep a journal of what you ate when you have diarrhea episodes

## 2016-07-30 NOTE — Progress Notes (Signed)
Pre visit review using our clinic review tool, if applicable. No additional management support is needed unless otherwise documented below in the visit note. 

## 2016-07-31 NOTE — Assessment & Plan Note (Signed)
Spasm on exam  Neck films today-pending Pt has hx of cervical disc dz -no neuro symptoms today Px flexeril  Adv use of heat and a cervical supp pillow  Will update with response

## 2016-07-31 NOTE — Assessment & Plan Note (Signed)
Intermittent - could be IBS Enc to keep a journal of diet when symptoms happen  In light of chronic abx - also will check for c diff if possible - she will bring a sample back  No abd pain / reassuring exam

## 2016-07-31 NOTE — Assessment & Plan Note (Signed)
Lab for re check today

## 2016-08-01 ENCOUNTER — Encounter: Payer: Self-pay | Admitting: *Deleted

## 2016-08-04 ENCOUNTER — Other Ambulatory Visit: Payer: Self-pay | Admitting: Family Medicine

## 2016-08-04 ENCOUNTER — Other Ambulatory Visit: Payer: Self-pay | Admitting: Pulmonary Disease

## 2016-08-04 DIAGNOSIS — R0600 Dyspnea, unspecified: Secondary | ICD-10-CM

## 2016-08-04 DIAGNOSIS — R197 Diarrhea, unspecified: Secondary | ICD-10-CM

## 2016-08-05 ENCOUNTER — Ambulatory Visit (INDEPENDENT_AMBULATORY_CARE_PROVIDER_SITE_OTHER): Payer: Medicare Other | Admitting: Pulmonary Disease

## 2016-08-05 ENCOUNTER — Encounter: Payer: Self-pay | Admitting: Pulmonary Disease

## 2016-08-05 VITALS — BP 98/60 | HR 67 | Ht 66.0 in | Wt 204.2 lb

## 2016-08-05 DIAGNOSIS — R05 Cough: Secondary | ICD-10-CM

## 2016-08-05 DIAGNOSIS — I739 Peripheral vascular disease, unspecified: Secondary | ICD-10-CM | POA: Diagnosis not present

## 2016-08-05 DIAGNOSIS — R053 Chronic cough: Secondary | ICD-10-CM

## 2016-08-05 DIAGNOSIS — G4733 Obstructive sleep apnea (adult) (pediatric): Secondary | ICD-10-CM

## 2016-08-05 DIAGNOSIS — M349 Systemic sclerosis, unspecified: Secondary | ICD-10-CM

## 2016-08-05 DIAGNOSIS — I10 Essential (primary) hypertension: Secondary | ICD-10-CM | POA: Diagnosis not present

## 2016-08-05 LAB — C. DIFFICILE GDH AND TOXIN A/B
C. DIFF TOXIN A/B: NOT DETECTED
C. DIFFICILE GDH: NOT DETECTED

## 2016-08-05 NOTE — Patient Instructions (Signed)
Okay to stop using CPAP Go back to using oxygen during sleep 2 L We will check oxygen levels at this level  Check vascular ultrasound of both lower extremities for circulation  Okay to take Delsym for cough as needed

## 2016-08-05 NOTE — Assessment & Plan Note (Signed)
This may be related to bronchiectasis right lower lobe  Okay to use Delsym as needed

## 2016-08-05 NOTE — Addendum Note (Signed)
Addended by: Parke Poisson E on: 08/05/2016 01:03 PM   Modules accepted: Orders

## 2016-08-05 NOTE — Progress Notes (Signed)
   Subjective:    Patient ID: [name], female    DOB: Jun 14, 1934, 82 y.o.   MRN: [id number]  HPI  82 year old woman with pulmonary hypertension, OSA and Chronic hypoxic respiratory failure on O2 (w/ CPAP) , PE , Cor Pulmonale and PSVT /A-Fib s/p Ablation 02/2015   She had a complicated course after left knee replacement in 2013 due to a DVT/PE. Tx w/ anticoagulation >1 year, Off Xarelto Dec 2015.  She has hx of Atrial Fib and PSVT s/p ablation in 02/2015 Followed by Dr. Rayann Heman    08/05/2016 Chief Complaint  Patient presents with  . Follow-up    struggling with cpap.  Pain in neck, on muscle relaxers.  Unable to tolerate mask.  has a lot of headaches.    Accompanied by her daughter She is still struggling with CPAP machine and has been unable to use it on review of download. She still has 2 L oxygen at home She reports a chronic cough, nonproductive, denies diurnal or seasonal variation  Her feet are noted to be blue today, she denies having noticed this on prior occasion -improves immediately with limb elevation  At her last visit she was evaluated for chronic cough and given pepcid, delsym She denies sinus drip or heartburn  She is on long-term antibiotics-Keflex and rifampin for a chronic wound after drainage of Baker's cyst-sees ID at [facility] Her blood pressure is low today she denies dizziness    She ambulates with a walker for short distances  Significant tests/ events  RHC (06/2014)  RA mean 6  PA 43/12, mean 25  PCWP mean 16  PVR 1.6 WU  CI 2.9   PFTs (05/25/14) FEV1 1.88 L (88%) FVC 2.22 L (77%) ratio 114, DLCO 68%   HRCT chest 01/2015 mild bronchiectasis right lower lobe VQ 05/30/14: Low prob   PSG 06/2014 >> severe OSA, AHI 62 per hour, corrected by CPAP 12 cm with small fullface mask. Central apneas emergent higher pressures  ONO on CPPA/RA shows 71 min desatn -Add 2L O2 to CPAP ONO on CPAP/2 L O2 >> no desatn  Echo 02/12/15 demonstrated EF  55%, mild MR, moderate TR, PA pressure 57, RV severely dilated, small pericardial effusion, LA 37.   Review of Systems Patient denies significant dyspnea,cough, hemoptysis,  chest pain, palpitations, pedal edema, orthopnea, paroxysmal nocturnal dyspnea, lightheadedness, nausea, vomiting, abdominal or  leg pains      Objective:   Physical Exam  Gen. Pleasant, obese, in no distress ENT - no lesions, no post nasal drip Neck: No JVD, no thyromegaly, no carotid bruits Lungs: no use of accessory muscles, no dullness to percussion, decreased without rales or rhonchi  Cardiovascular: Rhythm regular, heart sounds  normal, no murmurs or gallops, no peripheral edema Musculoskeletal: No deformities, no cyanosis or clubbing , no tremors, cyanotic feet        Assessment & Plan:

## 2016-08-05 NOTE — Assessment & Plan Note (Signed)
Blood pressure is low today and will have her hold her metoprolol for now with blood pressure checks at home and follow up with PCP

## 2016-08-05 NOTE — Assessment & Plan Note (Signed)
She has been simply unable to use CPAP in spite of multiple trials  Okay to stop using CPAP Go back to using oxygen during sleep 2 L We will check oxygen levels at this level

## 2016-08-05 NOTE — Assessment & Plan Note (Signed)
Check vascular ultrasound of both lower extremities for circulation Not clear about this diagnosis of scleroderma-but her symptoms may be related to Raynard's phenomenon

## 2016-08-06 ENCOUNTER — Other Ambulatory Visit: Payer: Medicare Other

## 2016-08-07 ENCOUNTER — Ambulatory Visit (HOSPITAL_COMMUNITY)
Admission: RE | Admit: 2016-08-07 | Discharge: 2016-08-07 | Disposition: A | Discharge status: Home / Self Care | Payer: Medicare Other | Source: Ambulatory Visit | Attending: Pulmonary Disease | Admitting: Pulmonary Disease

## 2016-08-07 DIAGNOSIS — I739 Peripheral vascular disease, unspecified: Secondary | ICD-10-CM | POA: Insufficient documentation

## 2016-08-07 NOTE — Progress Notes (Signed)
VASCULAR LAB PRELIMINARY  ARTERIAL  ABI completed:ABIs within normal limits at rest.    RIGHT    LEFT    PRESSURE WAVEFORM  PRESSURE WAVEFORM  BRACHIAL 129 T BRACHIAL 130 T  DP   DP    AT 157 B AT 140 T  PT 169 T PT 155 T  PER   PER    GREAT TOE  NA GREAT TOE  NA    RIGHT LEFT  ABI 1.3 1.9     Goebel Hellums, RVT 08/07/2016, 10:35 AM

## 2016-08-11 ENCOUNTER — Telehealth: Payer: Self-pay | Admitting: Pulmonary Disease

## 2016-08-11 DIAGNOSIS — R911 Solitary pulmonary nodule: Secondary | ICD-10-CM

## 2016-08-11 NOTE — Telephone Encounter (Signed)
Notes Recorded by Rigoberto Noel, MD on 08/07/2016 at 4:48 PM EDT Circulation appears ok ----- Order for ONO entered.  Patient aware. Nothing further needed.

## 2016-08-12 ENCOUNTER — Encounter (HOSPITAL_BASED_OUTPATIENT_CLINIC_OR_DEPARTMENT_OTHER): Payer: Medicare Other | Attending: Surgery

## 2016-08-12 DIAGNOSIS — Z86718 Personal history of other venous thrombosis and embolism: Secondary | ICD-10-CM | POA: Insufficient documentation

## 2016-08-12 DIAGNOSIS — I10 Essential (primary) hypertension: Secondary | ICD-10-CM | POA: Insufficient documentation

## 2016-08-12 DIAGNOSIS — G4730 Sleep apnea, unspecified: Secondary | ICD-10-CM | POA: Diagnosis not present

## 2016-08-12 DIAGNOSIS — Y838 Other surgical procedures as the cause of abnormal reaction of the patient, or of later complication, without mention of misadventure at the time of the procedure: Secondary | ICD-10-CM | POA: Insufficient documentation

## 2016-08-12 DIAGNOSIS — Z96653 Presence of artificial knee joint, bilateral: Secondary | ICD-10-CM | POA: Insufficient documentation

## 2016-08-12 DIAGNOSIS — I480 Paroxysmal atrial fibrillation: Secondary | ICD-10-CM | POA: Insufficient documentation

## 2016-08-12 DIAGNOSIS — M1990 Unspecified osteoarthritis, unspecified site: Secondary | ICD-10-CM | POA: Insufficient documentation

## 2016-08-12 DIAGNOSIS — T8189XA Other complications of procedures, not elsewhere classified, initial encounter: Secondary | ICD-10-CM | POA: Diagnosis not present

## 2016-08-21 ENCOUNTER — Ambulatory Visit: Payer: 59 | Admitting: Psychology

## 2016-09-09 ENCOUNTER — Encounter (HOSPITAL_BASED_OUTPATIENT_CLINIC_OR_DEPARTMENT_OTHER): Payer: Medicare Other | Attending: Surgery

## 2016-09-09 DIAGNOSIS — G4730 Sleep apnea, unspecified: Secondary | ICD-10-CM | POA: Diagnosis not present

## 2016-09-09 DIAGNOSIS — Z86718 Personal history of other venous thrombosis and embolism: Secondary | ICD-10-CM | POA: Insufficient documentation

## 2016-09-09 DIAGNOSIS — Z09 Encounter for follow-up examination after completed treatment for conditions other than malignant neoplasm: Secondary | ICD-10-CM | POA: Diagnosis present

## 2016-09-09 DIAGNOSIS — Z872 Personal history of diseases of the skin and subcutaneous tissue: Secondary | ICD-10-CM | POA: Insufficient documentation

## 2016-09-09 DIAGNOSIS — I10 Essential (primary) hypertension: Secondary | ICD-10-CM | POA: Insufficient documentation

## 2016-09-19 ENCOUNTER — Other Ambulatory Visit: Payer: Self-pay | Admitting: Adult Health

## 2016-09-25 ENCOUNTER — Ambulatory Visit (INDEPENDENT_AMBULATORY_CARE_PROVIDER_SITE_OTHER): Payer: 59 | Admitting: Psychology

## 2016-09-25 DIAGNOSIS — F4321 Adjustment disorder with depressed mood: Secondary | ICD-10-CM | POA: Diagnosis not present

## 2016-09-26 ENCOUNTER — Telehealth: Payer: Self-pay | Admitting: Pulmonary Disease

## 2016-09-26 ENCOUNTER — Telehealth: Payer: Self-pay

## 2016-09-26 DIAGNOSIS — R2689 Other abnormalities of gait and mobility: Secondary | ICD-10-CM

## 2016-09-26 NOTE — Telephone Encounter (Signed)
Note left at front desk that pt sees Leanne Lovely and Karna Christmas suggested that pt needs more PT due to pt having problems walking and difficulty with balance.Please advise.

## 2016-09-26 NOTE — Telephone Encounter (Signed)
Pt was never scheduled for ONO ordered on 08/11/2016.  PCC's please advise on status of order.  Thanks!

## 2016-09-26 NOTE — Telephone Encounter (Signed)
Please ask her if her primary pain is pain (if so ,where) or balance or both ? Recent falls Does she pref [place] or [place]?  thanks

## 2016-09-26 NOTE — Telephone Encounter (Signed)
Pt said she isn't having any pain it's just her balance is "off" pt said she hasn't had any falls and does agree to PT she would like to go to [place] because she said she's had PT in [place] before

## 2016-09-26 NOTE — Telephone Encounter (Signed)
Ref done  Will route to PCC 

## 2016-09-29 NOTE — Telephone Encounter (Signed)
Sent message to [facility] that is where Joselyn Arrow was ordered to see what the problem is Joellen [place]

## 2016-09-29 NOTE — Telephone Encounter (Signed)
I have spoken to [place] [email or web address]  he does not know what happened the order was processed he is putting in as a high priority for pt to be called asap Joellen [place]

## 2016-09-29 NOTE — Telephone Encounter (Signed)
lmtcb X1 for pt to make aware of below.

## 2016-09-30 NOTE — Telephone Encounter (Signed)
Spoke with pt. She states that [facility] contacted her yesterday about doing the ONO. Nothing further was needed.

## 2016-10-12 ENCOUNTER — Other Ambulatory Visit: Payer: Self-pay | Admitting: Family Medicine

## 2016-10-13 ENCOUNTER — Telehealth: Payer: Self-pay | Admitting: Pulmonary Disease

## 2016-10-13 NOTE — Telephone Encounter (Signed)
ONO on 2 L showed only mild desaturation about 30 minutes-probably during REM sleep Continue 2 L oxygen during sleep

## 2016-10-17 NOTE — Telephone Encounter (Signed)
Spoke with pt. She is aware of results. Nothing further was needed.  

## 2016-10-23 ENCOUNTER — Encounter (HOSPITAL_COMMUNITY): Payer: Self-pay | Admitting: Emergency Medicine

## 2016-10-23 ENCOUNTER — Emergency Department (HOSPITAL_COMMUNITY)
Admission: EM | Admit: 2016-10-23 | Discharge: 2016-10-24 | Disposition: A | Discharge status: Home / Self Care | Payer: Medicare Other | Attending: Emergency Medicine | Admitting: Emergency Medicine

## 2016-10-23 ENCOUNTER — Emergency Department (HOSPITAL_COMMUNITY): Payer: Medicare Other

## 2016-10-23 ENCOUNTER — Encounter: Payer: Self-pay | Admitting: Internal Medicine

## 2016-10-23 ENCOUNTER — Ambulatory Visit (INDEPENDENT_AMBULATORY_CARE_PROVIDER_SITE_OTHER): Payer: Medicare Other | Admitting: Internal Medicine

## 2016-10-23 VITALS — BP 102/68 | HR 57 | Temp 97.8°F | Wt 209.0 lb

## 2016-10-23 DIAGNOSIS — M7122 Synovial cyst of popliteal space [Baker], left knee: Secondary | ICD-10-CM | POA: Diagnosis not present

## 2016-10-23 DIAGNOSIS — M25562 Pain in left knee: Secondary | ICD-10-CM

## 2016-10-23 DIAGNOSIS — Z96653 Presence of artificial knee joint, bilateral: Secondary | ICD-10-CM | POA: Diagnosis not present

## 2016-10-23 DIAGNOSIS — L03116 Cellulitis of left lower limb: Secondary | ICD-10-CM | POA: Insufficient documentation

## 2016-10-23 DIAGNOSIS — R52 Pain, unspecified: Secondary | ICD-10-CM

## 2016-10-23 DIAGNOSIS — L02419 Cutaneous abscess of limb, unspecified: Secondary | ICD-10-CM

## 2016-10-23 DIAGNOSIS — I10 Essential (primary) hypertension: Secondary | ICD-10-CM | POA: Insufficient documentation

## 2016-10-23 LAB — CBC WITH DIFFERENTIAL/PLATELET
BASOS ABS: 0 10*3/uL (ref 0.0–0.1)
BASOS PCT: 0 %
EOS ABS: 0.2 10*3/uL (ref 0.0–0.7)
Eosinophils Relative: 5 %
HEMATOCRIT: 42.7 % (ref 36.0–46.0)
HEMOGLOBIN: 13.7 g/dL (ref 12.0–15.0)
Lymphocytes Relative: 26 %
Lymphs Abs: 1.4 10*3/uL (ref 0.7–4.0)
MCH: 30.2 pg (ref 26.0–34.0)
MCHC: 32.1 g/dL (ref 30.0–36.0)
MCV: 94.1 fL (ref 78.0–100.0)
MONOS PCT: 10 %
Monocytes Absolute: 0.5 10*3/uL (ref 0.1–1.0)
NEUTROS ABS: 3 10*3/uL (ref 1.7–7.7)
NEUTROS PCT: 59 %
Platelets: 226 10*3/uL (ref 150–400)
RBC: 4.54 MIL/uL (ref 3.87–5.11)
RDW: 13.5 % (ref 11.5–15.5)
WBC: 5.2 10*3/uL (ref 4.0–10.5)

## 2016-10-23 LAB — I-STAT CHEM 8, ED
BUN: 25 mg/dL — AB (ref 6–20)
CHLORIDE: 96 mmol/L — AB (ref 101–111)
CREATININE: 1 mg/dL (ref 0.44–1.00)
Calcium, Ion: 1.14 mmol/L — ABNORMAL LOW (ref 1.15–1.40)
Glucose, Bld: 139 mg/dL — ABNORMAL HIGH (ref 65–99)
HEMATOCRIT: 41 % (ref 36.0–46.0)
Hemoglobin: 13.9 g/dL (ref 12.0–15.0)
POTASSIUM: 3.8 mmol/L (ref 3.5–5.1)
SODIUM: 139 mmol/L (ref 135–145)
TCO2: 34 mmol/L (ref 0–100)

## 2016-10-23 LAB — I-STAT CG4 LACTIC ACID, ED: Lactic Acid, Venous: 1.95 mmol/L (ref 0.5–1.9)

## 2016-10-23 MED ORDER — LIDOCAINE-EPINEPHRINE (PF) 2 %-1:200000 IJ SOLN
10.0000 mL | Freq: Once | INTRAMUSCULAR | Status: AC
  Administered 2016-10-23: 10 mL via INTRADERMAL
  Filled 2016-10-23: qty 20

## 2016-10-23 MED ORDER — IOPAMIDOL (ISOVUE-300) INJECTION 61%
INTRAVENOUS | Status: AC
  Filled 2016-10-23: qty 100

## 2016-10-23 MED ORDER — IOPAMIDOL (ISOVUE-300) INJECTION 61%
INTRAVENOUS | Status: AC
  Administered 2016-10-24: 75 mL
  Filled 2016-10-23: qty 100

## 2016-10-23 NOTE — Patient Instructions (Signed)

## 2016-10-23 NOTE — ED Provider Notes (Signed)
[facility] DEPT Provider Note   CSN: [id number] Arrival date & time: 10/23/16  1604     History   Chief Complaint Chief Complaint  Patient presents with  . Cyst    HPI [name] is a 82 y.o. female.  HPI   82 year old female with history of bilateral total knee replacement, arthritis, anemia, prior PE, osteopenia prior infection of the prosthetic knee presenting complaining of left knee pain. Patient states she has history of infected left Baker's cyst in the past requiring incision and drainage. She is currently on permanent nobody including Keflex and rifampin. For the past 2 days she noticed increasing pain and swelling and redness to the back of her left knee that felt similar to the infected Baker's cyst that she had in the past. Her pain is moderate in intensity, nonradiating. She denies any associated fever, chills, back pain. She is up-to-date with his tetanus. She did follow-up with her primary care provider today who encouraged patient to come to ER for further management. Her orthopedists is at [facility].  Past Medical History:  Diagnosis Date  . Anemia   . Arthritis    knee  . Diverticulosis   . Hx of colonic polyps 10/24/2014  . Hyperlipidemia   . Hypertension   . Osteopenia   . Overactive bladder    urge incontinence  . PSVT (paroxysmal supraventricular tachycardia) ([place])   . Pulmonary emboli ([place])   . Pulmonary hypertension   . Right ventricular failure ([place])   . Sleep apnea    CPAP  . Spinal stenosis     Patient Active Problem List   Diagnosis Date Noted  . Poor balance 09/26/2016  . Neck pain 07/30/2016  . Diarrhea 07/30/2016  . Routine general medical examination at a health care facility 06/26/2016  . Elevated TSH 06/25/2016  . Stress reaction 06/04/2016  . Intertrigo 06/04/2016  . Chronic cough 05/01/2016  . Constipation 01/09/2016  . Umbilical hernia [id number]  . Chronic respiratory failure ([place]) 10/05/2015  . Thromboembolism of  vein 10/02/2015  . Infection of prosthetic joint ([place]) 09/18/2015  . Infection of prosthetic left knee joint ([place]) 09/12/2015  . Effusion of knee 08/30/2015  . Pulmonary hypertension   . Physical deconditioning 02/25/2015  . RBBB-new 02/12/2015  . History of bilat PE after TKR (off Coumadin Dec 2015) 02/12/2015  . Abnormal EKG-TWI, RBBB, prolonged QT   . Hernia, umbilical [id number]  . Hx of colonic polyps 10/24/2014  . Diverticulosis of colon without hemorrhage   . OSA (obstructive sleep apnea) 07/22/2014  . Unspecified arthropathy, lower leg 03/21/2014  . History of right knee joint replacement 03/15/2014  . Arthritis of knee 03/09/2014  . Solitary pulmonary nodule 05/03/2013  . Sternal fracture 04/26/2013  . PE (pulmonary embolism) 11/24/2012  . History of DVT (deep vein thrombosis) 11/24/2012  . History of artificial joint 10/29/2012  . 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 BP (high blood pressure) 10/14/2012  . Complications due to internal joint prosthesis ([place]) 09/07/2012  . PES PLANUS 08/15/2010  . URINARY FREQUENCY, CHRONIC 07/14/2008  . Dyslipidemia 07/09/2007  . Essential hypertension 07/09/2007  . PSVT with SSS 02/23/2007  . SCLERODERMA 02/23/2007  . DEGENERATIVE JOINT DISEASE 02/23/2007  . SPINAL STENOSIS, LUMBAR 02/23/2007  . History of osteopenia 02/23/2007  . SPONDYLOLISTHESIS 02/23/2007    Past Surgical History:  Procedure Laterality Date  . BLADDER SUSPENSION  1993  . CARDIAC CATHETERIZATION N/A 02/28/2015   Procedure: Right/Left Heart Cath and Coronary Angiography;  Surgeon:  Troy Sine, MD;  Location: [facility] INVASIVE CV LAB CUPID;  Service: Cardiovascular;  Laterality: N/A;  . CATARACT EXTRACTION Bilateral 2008  . CHOLECYSTECTOMY    . COLONOSCOPY    . COLONOSCOPY N/A 10/24/2014   Procedure: COLONOSCOPY;  Surgeon: Gatha Mayer, MD;  Location: [facility] ENDOSCOPY;  Service: Endoscopy;  Laterality: N/A;  . CYST REMOVAL LEG Left 02/15/2016   cyst  removal from back of left knee  . ELECTROPHYSIOLOGIC STUDY N/A 03/02/2015   Procedure: SVT Ablation;  Surgeon: Evans Lance, MD;  Location: [facility] CV LAB;  Service: Cardiovascular;  Laterality: N/A;  . INCISIONAL HERNIA REPAIR    . RIGHT HEART CATHETERIZATION N/A 07/04/2014   Procedure: RIGHT HEART CATH;  Surgeon: Jolaine Artist, MD;  Location: [facility] CATH LAB;  Service: Cardiovascular;  Laterality: N/A;  . TONSILLECTOMY AND ADENOIDECTOMY    . TOTAL KNEE ARTHROPLASTY Left 2000  . TOTAL KNEE ARTHROPLASTY Left 10/29/2012   at [facility]  . TOTAL KNEE ARTHROPLASTY Right 03/09/2014  . TOTAL KNEE REVISION  01/12/2012   Procedure: TOTAL KNEE REVISION;  Surgeon: Kerin Salen, MD;  Location: [facility];  Service: Orthopedics;  Laterality: Left;  DEPUY/ LCS , HAND SET  . TRIGGER FINGER RELEASE  01/12/2012   Procedure: RELEASE TRIGGER FINGER/A-1 PULLEY;  Surgeon: Kerin Salen, MD;  Location: [facility];  Service: Orthopedics;  Laterality: Right;    OB History    No data available       Home Medications    Prior to Admission medications   Medication Sig Start Date End Date Taking? Authorizing Provider  benzonatate (TESSALON) 200 MG capsule TAKE ONE CAPSULE BY MOUTH TWICE DAILY ASNEEDED FOR COUGH 09/19/16   Tammy S Parrett, NP  Calcium Carb-Cholecalciferol (CALCIUM 600/VITAMIN D3) 600-800 MG-UNIT TABS Take 1 tablet by mouth 2 (two) times daily.    Historical Provider, MD  cephALEXin (KEFLEX) 500 MG capsule Take 500 mg by mouth 3 (three) times daily. 03/29/16   Historical Provider, MD  Cholecalciferol (VITAMIN D-1000 MAX ST) 1000 UNITS tablet Take 1,000 Units by mouth daily. 09/08/15   Historical Provider, MD  cyclobenzaprine (FLEXERIL) 10 MG tablet Take 0.5-1 tablets (5-10 mg total) by mouth 3 (three) times daily as needed for muscle spasms. For neck pain , watch out for sedation 07/30/16   Abner Greenspan, MD  hydrochlorothiazide (HYDRODIURIL) 25 MG tablet TAKE 1 TABLET DAILY 10/13/16   Abner Greenspan, MD    ketoconazole (NIZORAL) 2 % cream Apply 1 application topically 2 (two) times daily as needed for irritation. To affected areas in skin folds 06/04/16   Abner Greenspan, MD  metoprolol tartrate (LOPRESSOR) 25 MG tablet Take 0.5 tablets (12.5 mg total) by mouth 2 (two) times daily. 12/03/15   Thompson Grayer, MD  Multiple Vitamin (MULTIVITAMIN WITH MINERALS) TABS tablet Take 1 tablet by mouth daily.    Historical Provider, MD  naproxen sodium (ANAPROX) 220 MG tablet Take 220 mg by mouth daily as needed (pain).     Historical Provider, MD  polyethylene glycol (MIRALAX / GLYCOLAX) packet Take 17 g by mouth daily.     Historical Provider, MD  rifampin (RIFADIN) 300 MG capsule Take 300 mg by mouth 2 (two) times daily. 04/04/16   Historical Provider, MD    Family History Family History  Problem Relation Age of Onset  . Breast cancer Mother   . Heart disease Father   . Diabetes Father   . Hypertension Father   . Uterine cancer  Sister     Social History Social History  Substance Use Topics  . Smoking status: Never Smoker  . Smokeless tobacco: Never Used  . Alcohol use No     Allergies   Zithromax [azithromycin]; Sulfonamide derivatives; Adhesive [tape]; and Prednisone   Review of Systems Review of Systems  All other systems reviewed and are negative.    Physical Exam Updated Vital Signs BP (!) 113/52   Pulse 63   Temp 97.7 F (36.5 C) (Oral)   Resp 16   Ht 5\' 6"  (1.676 m)   Wt 94.8 kg   SpO2 94%   BMI 33.73 kg/m   Physical Exam  Constitutional: She appears well-developed and well-nourished. No distress.  HENT:  Head: Atraumatic.  Eyes: Conjunctivae are normal.  Neck: Neck supple.  Cardiovascular: Normal rate, regular rhythm and intact distal pulses.   Pulmonary/Chest: Effort normal and breath sounds normal.  Musculoskeletal: She exhibits tenderness (Left knee: Nonhealing ulcer to the anterior knee that is chronic and nontender. An area of erythema in the edema and mild  fluctuant noted to the posterior fossa with tenderness to palpation.).  Neurological: She is alert.  Skin: No rash noted.  Psychiatric: She has a normal mood and affect.  Nursing note and vitals reviewed.    ED Treatments / Results  Labs (all labs ordered are listed, but only abnormal results are displayed) Labs Reviewed  I-STAT CHEM 8, ED - Abnormal; Notable for the following:       Result Value   Chloride 96 (*)    BUN 25 (*)    Glucose, Bld 139 (*)    Calcium, Ion 1.14 (*)    All other components within normal limits  I-STAT CG4 LACTIC ACID, ED - Abnormal; Notable for the following:    Lactic Acid, Venous 1.95 (*)    All other components within normal limits  AEROBIC CULTURE (SUPERFICIAL SPECIMEN)  CBC WITH DIFFERENTIAL/PLATELET    EKG  EKG Interpretation None       Radiology No results found.  Procedures Procedures (including critical care time)  INCISION AND DRAINAGE Performed by: Domenic Moras Consent: Verbal consent obtained. Risks and benefits: risks, benefits and alternatives were discussed Type: abscess  Body area: L posterior fossa, Baker Cyst  Anesthesia: local infiltration  Incision was made with a scalpel.  Local anesthetic: lidocaine 2% w epinephrine  Anesthetic total: 3 ml  Complexity: complex Blunt dissection to break up loculations  Drainage: hay color synovial fluid  Drainage amount: moderate  Packing material: 1/4 in iodoform gauze  Patient tolerance: Patient tolerated the procedure well with no immediate complications.     Medications Ordered in ED Medications  iopamidol (ISOVUE-300) 61 % injection (not administered)  lidocaine-EPINEPHrine (XYLOCAINE W/EPI) 2 %-1:200000 (PF) injection 10 mL (10 mLs Intradermal Given by Other 10/23/16 2142)  iopamidol (ISOVUE-300) 61 % injection (75 mLs  Contrast Given 10/24/16 0000)     Initial Impression / Assessment and Plan / ED Course  I have reviewed the triage vital signs and the  nursing notes.  Pertinent labs & imaging results that were available during my care of the patient were reviewed by me and considered in my medical decision making (see chart for details).  Clinical Course     BP 125/59 (BP Location: Right Arm)   Pulse 70   Temp 97.7 F (36.5 C) (Oral)   Resp 16   Ht 5\' 6"  (1.676 m)   Wt 94.8 kg   SpO2 96%   BMI 33.73  kg/m    Final Clinical Impressions(s) / ED Diagnoses   Final diagnoses:  Left knee pain  Cyst, baker's knee, left  Cellulitis of knee, left    New Prescriptions New Prescriptions   DOXYCYCLINE (VIBRAMYCIN) 100 MG CAPSULE    Take 1 capsule (100 mg total) by mouth 2 (two) times daily. One po bid x 7 days   9:20 PM Patient with history of recurrent infected left Baker's cyst here with findings suggestive of new infection to her left knee area and she does have prior knee replacement surgery therefore I will obtain a CT scan of her knees to assess for potential joint hardware infection. She will benefit from incision and drainage procedure while she is in the ER.  12:00 AM I performed I&D of L baker's cyst and was able to expressed a moderate amount of hay/clear color synovial fluid, no pustular discharge.  She does have overlying cellulitic skin changes.  Plan to d/c with doxycycline abx.  She will f/u with her wound care clinic who have been treating her for similar infection in the past.    Care discussed with oncoming provider, who will f/u on L knee CT result to ensure no joint infection.  If negative, pt can f/u outpt for further care.    12:34 AM Radiology called to notify that CT scan will not be accurate due to artifacts from hardware.  Will sent wound culture.  Pt will be contacted if culture shows infection.  Pt able to range her knee, doubt septic joint.  Return precaution discussed.    Domenic Moras, PA-C 10/24/16 [place], MD 10/24/16 [phone]

## 2016-10-23 NOTE — ED Notes (Signed)
Dr. Little at bedside at this time.  

## 2016-10-23 NOTE — Progress Notes (Signed)
Subjective:    Patient ID: [name], female    DOB: [birth year], 82 y.o.   MRN: [id number]  HPI  Pt presents to the clinic today with c/o redness and swelling behind her left knee. She noticed this 2-3 days ago. The area is warm and tender to touch. She has not noticed any drainage from the area. She has not tried anything OTC for this. She has a PMH of infected left knee prosthesis. She has been following with ID and Orthopedics. She is chronic abx suppression with Keflex and Rifampin. She has had a popliteal cyst in the past, which had to be I&D 01/2016 and 02/2016. She has an appt with ID 1/10/28/2016. She called their office today, and reports they advised her to come see her PCP.  Review of Systems      Past Medical History:  Diagnosis Date  . Anemia   . Arthritis    knee  . Diverticulosis   . Hx of colonic polyps 10/24/2014  . Hyperlipidemia   . Hypertension   . Osteopenia   . Overactive bladder    urge incontinence  . PSVT (paroxysmal supraventricular tachycardia) ([place])   . Pulmonary emboli ([place])   . Pulmonary hypertension   . Right ventricular failure ([place])   . Sleep apnea    CPAP  . Spinal stenosis     Current Outpatient Prescriptions  Medication Sig Dispense Refill  . benzonatate (TESSALON) 200 MG capsule TAKE ONE CAPSULE BY MOUTH TWICE DAILY ASNEEDED FOR COUGH 180 capsule 0  . Calcium Carb-Cholecalciferol (CALCIUM 600/VITAMIN D3) 600-800 MG-UNIT TABS Take 1 tablet by mouth 2 (two) times daily.    . cephALEXin (KEFLEX) 500 MG capsule Take 500 mg by mouth 3 (three) times daily.    . Cholecalciferol (VITAMIN D-1000 MAX ST) 1000 UNITS tablet Take 1,000 Units by mouth daily.    . cyclobenzaprine (FLEXERIL) 10 MG tablet Take 0.5-1 tablets (5-10 mg total) by mouth 3 (three) times daily as needed for muscle spasms. For neck pain , watch out for sedation 30 tablet 0  . hydrochlorothiazide (HYDRODIURIL) 25 MG tablet TAKE 1 TABLET DAILY 90 tablet 1  . ketoconazole (NIZORAL)  2 % cream Apply 1 application topically 2 (two) times daily as needed for irritation. To affected areas in skin folds 30 g 2  . metoprolol tartrate (LOPRESSOR) 25 MG tablet Take 0.5 tablets (12.5 mg total) by mouth 2 (two) times daily. 90 tablet 3  . Multiple Vitamin (MULTIVITAMIN WITH MINERALS) TABS tablet Take 1 tablet by mouth daily.    . naproxen sodium (ANAPROX) 220 MG tablet Take 220 mg by mouth daily as needed (pain).     . polyethylene glycol (MIRALAX / GLYCOLAX) packet Take 17 g by mouth daily.     . rifampin (RIFADIN) 300 MG capsule Take 300 mg by mouth 2 (two) times daily.     No current facility-administered medications for this visit.     Allergies  Allergen Reactions  . Zithromax [Azithromycin] Other (See Comments)    Prolonged QT on EKG  . Sulfonamide Derivatives Hives  . Adhesive [Tape] Other (See Comments) and Rash    redness  . Prednisone Other (See Comments)    Causes insomnia    Family History  Problem Relation Age of Onset  . Breast cancer Mother   . Heart disease Father   . Diabetes Father   . Hypertension Father   . Uterine cancer Sister     Social History  Social History  . Marital status: Widowed    Spouse name: N/A  . Number of children: 3  . Years of education: N/A   Occupational History  . Retired   .  Retired   Social History Main Topics  . Smoking status: Never Smoker  . Smokeless tobacco: Never Used  . Alcohol use No  . Drug use: No  . Sexual activity: No   Other Topics Concern  . Not on file   Social History Narrative   Pt lives alone but dtr lives on property and has been staying with her since surgery.     Constitutional: Denies fever, malaise, fatigue, headache or abrupt weight changes.  Musculoskeletal: Denies decrease in range of motion, difficulty with gait, muscle pain or joint pain and swelling.  Skin: Pt presents to the clinic today with c/o redness, warmth and swelling behind left knee.    No other specific  complaints in a complete review of systems (except as listed in HPI above).  Objective:   Physical Exam  BP 102/68   Pulse (!) 57   Temp 97.8 F (36.6 C) (Oral)   Wt 209 lb (94.8 kg)   SpO2 96%   BMI 33.73 kg/m  Wt Readings from Last 3 Encounters:  10/23/16 209 lb (94.8 kg)  08/05/16 204 lb 3.2 oz (92.6 kg)  07/30/16 203 lb 4 oz (92.2 kg)    General: Appears her stated age, in NAD. Skin: 1cm round, raised popliteal abscess noted behind left knee. She has a 3 cm x 3 cm area of cellulitis noted around the abscess.  BMET    Component Value Date/Time   NA 141 06/20/2016 1201   NA 141 03/12/2015   K 3.8 06/20/2016 1201   CL 102 06/20/2016 1201   CO2 34 (H) 06/20/2016 1201   GLUCOSE 109 (H) 06/20/2016 1201   BUN 23 06/20/2016 1201   BUN 25 (A) 03/12/2015   CREATININE 0.92 06/20/2016 1201   CREATININE 0.85 12/03/2012 1736   CALCIUM 9.4 06/20/2016 1201   GFRNONAA 53 (L) 01/23/2016 1316   GFRAA >60 01/23/2016 1316    Lipid Panel     Component Value Date/Time   CHOL 162 06/20/2016 1201   TRIG 109.0 06/20/2016 1201   HDL 52.90 06/20/2016 1201   CHOLHDL 3 06/20/2016 1201   VLDL 21.8 06/20/2016 1201   LDLCALC 87 06/20/2016 1201    CBC    Component Value Date/Time   WBC 5.2 06/20/2016 1201   RBC 4.62 06/20/2016 1201   HGB 14.0 06/20/2016 1201   HCT 41.0 06/20/2016 1201   PLT 224.0 06/20/2016 1201   MCV 88.7 06/20/2016 1201   MCH 30.3 01/23/2016 1316   MCHC 34.2 06/20/2016 1201   RDW 14.5 06/20/2016 1201   LYMPHSABS 0.9 06/20/2016 1201   MONOABS 0.7 06/20/2016 1201   EOSABS 0.2 06/20/2016 1201   BASOSABS 0.0 06/20/2016 1201    Hgb A1C Lab Results  Component Value Date   HGBA1C 5.8 (H) 02/14/2015           Assessment & Plan:   Popliteal abscess, left knee:  D/W Dr. Danise Mina She has been on suppressive therapy for months, so changing abx at this point won't be helpful Called ID and ortho, to try to have her seen today or tomorrow- they have no  appts available and recommend she go to the ER Pt advised, daughter will take pt directly to the ER She will follow up with PCP and ID after  ER visit  Webb Silversmith, NP

## 2016-10-23 NOTE — ED Triage Notes (Signed)
Pt arrives to the ED with a bakers cyst Per her PCP who sent her here. Pt has history of this. Pt has red raised swollen area to back of left knee.

## 2016-10-24 ENCOUNTER — Encounter: Payer: Self-pay | Admitting: Internal Medicine

## 2016-10-24 ENCOUNTER — Emergency Department (HOSPITAL_COMMUNITY): Payer: Medicare Other

## 2016-10-24 MED ORDER — DOXYCYCLINE HYCLATE 100 MG PO CAPS: 100.0000 mg | ORAL_CAPSULE | Freq: Two times a day (BID) | ORAL | 0 refills | Status: DC

## 2016-10-24 NOTE — Discharge Instructions (Signed)
Please follow up with your wound care specialist for further management of your infected baker's cyst.  Take antibiotics as prescribed.  Return if you have any concerns.

## 2016-10-24 NOTE — ED Notes (Signed)
Patient transported to CT 

## 2016-10-26 LAB — AEROBIC CULTURE  (SUPERFICIAL SPECIMEN): CULTURE: NO GROWTH

## 2016-10-26 LAB — AEROBIC CULTURE W GRAM STAIN (SUPERFICIAL SPECIMEN): Special Requests: NORMAL

## 2016-10-28 ENCOUNTER — Encounter: Payer: Medicare Other | Attending: Internal Medicine | Admitting: Internal Medicine

## 2016-10-28 ENCOUNTER — Telehealth: Payer: Self-pay | Admitting: Family Medicine

## 2016-10-28 ENCOUNTER — Other Ambulatory Visit
Admission: RE | Admit: 2016-10-28 | Discharge: 2016-10-28 | Disposition: A | Discharge status: Home / Self Care | Payer: Medicare Other | Source: Ambulatory Visit | Attending: Internal Medicine | Admitting: Internal Medicine

## 2016-10-28 DIAGNOSIS — I10 Essential (primary) hypertension: Secondary | ICD-10-CM | POA: Diagnosis not present

## 2016-10-28 DIAGNOSIS — M7122 Synovial cyst of popliteal space [Baker], left knee: Secondary | ICD-10-CM | POA: Diagnosis not present

## 2016-10-28 DIAGNOSIS — B999 Unspecified infectious disease: Secondary | ICD-10-CM | POA: Insufficient documentation

## 2016-10-28 DIAGNOSIS — B9561 Methicillin susceptible Staphylococcus aureus infection as the cause of diseases classified elsewhere: Secondary | ICD-10-CM | POA: Diagnosis not present

## 2016-10-28 DIAGNOSIS — Z888 Allergy status to other drugs, medicaments and biological substances status: Secondary | ICD-10-CM | POA: Insufficient documentation

## 2016-10-28 DIAGNOSIS — G4730 Sleep apnea, unspecified: Secondary | ICD-10-CM | POA: Diagnosis not present

## 2016-10-28 DIAGNOSIS — L02416 Cutaneous abscess of left lower limb: Secondary | ICD-10-CM | POA: Insufficient documentation

## 2016-10-28 NOTE — Telephone Encounter (Signed)
Please tell her to hold it if her bp is below 110/60 -(or if she is dizzy) -otherwise take it  [facility] she is feeling better

## 2016-10-28 NOTE — Progress Notes (Signed)
[name], [name] ([id number]) Visit Report for 10/28/2016 Abuse/Suicide Risk Screen Details [name], [name]. Date of Service: 10/28/2016 8:00 AM Medical Record Patient Account Number: [id number] Number: Treating RN: Cornell Barman September 18, 1934 (82 y.o. Other Clinician: Date of Birth/Sex: Female) Treating ROBSON, MICHAEL Primary Care Physician/Extender: Ambrose Mantle, [place] Physician: Referring Physician: LITTLE, RACHEL Weeks in Treatment: 0 Abuse/Suicide Risk Screen Items Answer ABUSE/SUICIDE RISK SCREEN: Has anyone close to you tried to hurt or harm you recentlyo No Do you feel uncomfortable with anyone in your familyo No Has anyone forced you do things that you didnot want to doo No Do you have any thoughts of harming yourselfo No Patient displays signs or symptoms of abuse and/or neglect. No Electronic Signature(s) Signed: 10/28/2016 9:53:50 AM By: Gretta Cool, RN, BSN, Kim RN, BSN Entered By: Gretta Cool, RN, BSN, Kim on 10/28/2016 08:41:16 [name] ([id number]) -------------------------------------------------------------------------------- Activities of Daily Living Details [name], [name]. Date of Service: 10/28/2016 8:00 AM Medical Record Patient Account Number: [id number] Number: Treating RN: Cornell Barman 1934-04-16 (82 y.o. Other Clinician: Date of Birth/Sex: Female) Treating ROBSON, MICHAEL Primary Care Physician/Extender: Ambrose Mantle, [place] Physician: Referring Physician: LITTLE, RACHEL Weeks in Treatment: 0 Activities of Daily Living Items Answer Activities of Daily Living (Please select one for each item) Drive Automobile Completely Able Take Medications Completely Able Use Telephone Completely Able Care for Appearance Completely Able Use Toilet Completely Able Bath / Shower Completely Able Dress Self Completely Able Feed Self Completely Able Walk Completely Able Get In / Out Bed Completely Able Housework Completely Able Prepare Meals  Completely [place] Completely Able Shop for Self Completely Able Electronic Signature(s) Signed: 10/28/2016 9:53:50 AM By: Gretta Cool, RN, BSN, Kim RN, BSN Entered By: Gretta Cool, RN, BSN, Kim on 10/28/2016 08:41:27 [name] ([id number]) -------------------------------------------------------------------------------- Education Assessment Details [name]. Date of Service: 10/28/2016 8:00 AM Medical Record Patient Account Number: [id number] Number: Treating RN: Cornell Barman 1934-10-15 (82 y.o. Other Clinician: Date of Birth/Sex: Female) Treating ROBSON, MICHAEL Primary Care Physician/Extender: Ambrose Mantle, [place] Physician: Referring Physician: LITTLE, RACHEL Weeks in Treatment: 0 Primary Learner Assessed: Patient Learning Preferences/Education Level/Primary Language Learning Preference: Explanation Highest Education Level: High School Preferred Language: English Cognitive Barrier Assessment/Beliefs Language Barrier: No Translator Needed: No Memory Deficit: No Emotional Barrier: No Cultural/Religious Beliefs Affecting Medical No Care: Physical Barrier Assessment Impaired Vision: Yes Glasses Impaired Hearing: No Decreased Hand dexterity: No Knowledge/Comprehension Assessment Knowledge Level: High Comprehension Level: High Ability to understand written High instructions: Ability to understand verbal High instructions: Motivation Assessment Anxiety Level: Calm Cooperation: Cooperative Education Importance: Acknowledges Need Interest in Health Problems: Asks Questions Perception: Coherent Willingness to Engage in Self- High Management Activities: High [name], [name] ([id number]) Readiness to Engage in Self- Management Activities: Electronic Signature(s) Signed: 10/28/2016 9:53:50 AM By: Gretta Cool, RN, BSN, Kim RN, BSN Entered By: Gretta Cool, RN, BSN, Kim on 10/28/2016 08:42:05 [name]  ([id number]) -------------------------------------------------------------------------------- Fall Risk Assessment Details [name]. Date of Service: 10/28/2016 8:00 AM Medical Record Patient Account Number: [id number] Number: Treating RN: Cornell Barman 05-Jul-1934 (82 y.o. Other Clinician: Date of Birth/Sex: Female) Treating ROBSON, MICHAEL Primary Care Physician/Extender: Ambrose Mantle, [place] Physician: Referring Physician: LITTLE, RACHEL Weeks in Treatment: 0 Fall Risk Assessment Items Have you had 2 or more falls in the last 12 monthso 0 No Have you had any fall that resulted in injury in the last 12 monthso 0 No FALL RISK ASSESSMENT: History of falling -  immediate or within 3 months 0 No Secondary diagnosis 0 No Ambulatory aid None/bed rest/wheelchair/nurse 0 No Crutches/cane/walker 15 Yes Furniture 0 No IV Access/Saline Lock 0 No Gait/Training Normal/bed rest/immobile 0 Yes Weak 0 No Impaired 0 No Mental Status Oriented to own ability 0 Yes Electronic Signature(s) Signed: 10/28/2016 9:53:50 AM By: Gretta Cool, RN, BSN, Kim RN, BSN Entered By: Gretta Cool, RN, BSN, Kim on 10/28/2016 08:42:34 [name] ([id number]) -------------------------------------------------------------------------------- Foot Assessment Details [name]. Date of Service: 10/28/2016 8:00 AM Medical Record Patient Account Number: [id number] Number: Treating RN: Cornell Barman Jun 08, 1934 (82 y.o. Other Clinician: Date of Birth/Sex: Female) Treating ROBSON, MICHAEL Primary Care Physician/Extender: Ambrose Mantle, [place] Physician: Referring Physician: LITTLE, RACHEL Weeks in Treatment: 0 Foot Assessment Items Site Locations + = Sensation present, - = Sensation absent, C = Callus, U = Ulcer R = Redness, W = Warmth, M = Maceration, PU = Pre-ulcerative lesion F = Fissure, S = Swelling, D = Dryness Assessment Right: Left: Other Deformity: No No Prior Foot Ulcer: No  No Prior Amputation: No No Charcot Joint: No No Ambulatory Status: Ambulatory Without Help Gait: Steady Electronic Signature(s) Signed: 10/28/2016 9:53:50 AM By: Gretta Cool, RN, BSN, Kim RN, BSN Entered By: Gretta Cool, RN, BSN, Kim on 10/28/2016 08:43:18 Obey, Blima Singer ([id number] Blocker, Blima Singer ([id number]) -------------------------------------------------------------------------------- Nutrition Risk Assessment Details [name]. Date of Service: 10/28/2016 8:00 AM Medical Record Patient Account Number: [id number] Number: Treating RN: Cornell Barman 01-25-1934 (82 y.o. Other Clinician: Date of Birth/Sex: Female) Treating ROBSON, MICHAEL Primary Care Physician/Extender: Ambrose Mantle, [place] Physician: Referring Physician: LITTLE, RACHEL Weeks in Treatment: 0 Height (in): 66 Weight (lbs): 209 Body Mass Index (BMI): 33.7 Nutrition Risk Assessment Items NUTRITION RISK SCREEN: I have an illness or condition that made me change the kind and/or 0 No amount of food I eat I eat fewer than two meals per day 0 No I eat few fruits and vegetables, or milk products 0 No I have three or more drinks of beer, liquor or wine almost every day 0 No I have tooth or mouth problems that make it hard for me to eat 0 No I don't always have enough money to buy the food I need 0 No I eat alone most of the time 0 No I take three or more different prescribed or over-the-counter drugs a 1 Yes day Without wanting to, I have lost or gained 10 pounds in the last six 0 No months I am not always physically able to shop, cook and/or feed myself 0 No Nutrition Protocols Good Risk Protocol 0 No interventions needed Moderate Risk Protocol Electronic Signature(s) Signed: 10/28/2016 9:53:50 AM By: Gretta Cool, RN, BSN, Kim RN, BSN Entered By: Gretta Cool, RN, BSN, Kim on 10/28/2016 08:42:45

## 2016-10-28 NOTE — Telephone Encounter (Signed)
Patient advised and repeated instructions. 

## 2016-10-28 NOTE — Telephone Encounter (Signed)
Pt called to see if she was still supposed to be taking Lopressor.  Can you please call her about this.  Thanks.

## 2016-10-29 NOTE — Progress Notes (Signed)
KERMIT, MOTHERSHED ([id number]) Visit Report for 10/28/2016 Chief Complaint Document Details [name], [name]. Date of Service: 10/28/2016 8:00 AM Medical Record Patient Account Number: [id number] Number: Treating RN: Cornell Barman Apr 14, 1934 (83 y.o. Other Clinician: Date of Birth/Sex: Female) Treating ROBSON, MICHAEL Primary Care Physician/Extender: Ambrose Mantle, [place] Physician: Referring Physician: LITTLE, RACHEL Weeks in Treatment: 0 Information Obtained from: Patient Chief Complaint 10/28/16; patient is here for review of a draining area in her left popliteal fossa that is been present for 8 days Electronic Signature(s) Signed: 10/28/2016 4:57:22 PM By: Linton Ham MD Entered By: Linton Ham on 10/28/2016 09:32:28 [name] ([id number]) -------------------------------------------------------------------------------- HPI Details [name]. Date of Service: 10/28/2016 8:00 AM Medical Record Patient Account Number: [id number] Number: Treating RN: Cornell Barman November 03, 1933 (83 y.o. Other Clinician: Date of Birth/Sex: Female) Treating ROBSON, MICHAEL Primary Care Physician/Extender: Ambrose Mantle, [place] Physician: Referring Physician: LITTLE, RACHEL Weeks in Treatment: 0 History of Present Illness HPI Description: 10/28/16; this is an 83 year old woman who arrives with a complicated medical issue. 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 Initially in 2000 and apparently most recently in 2014. 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 Indeed she was hospitalized on 02/13/16 and discharged on 02/19/16. She was taken to the OR and it was found that the popliteal abscess was communicating with her joint space. Signed to feel fluid grew MSSA. Patient was treated with IandD of the abscess. She completed IV cefazolin on 03/28/16 and since then has been on Keflex and rifampin for chronic suppression.  Her infectious disease doctor is Dr.Shrestha at [facility]. The patient was seen in our [place] sister clinic through much of October and November 2017. At that point she had a draining sinus that eventually closed over. She noted some pain behind her knee surrounding Christmas day and was seen in our emergency room on 10/23/16. During this ER visit she underwent tendon incision and drainage of a left Baker's cyst and clear colored synovial fluid was obtained. Doxycycline was added for a week to her Keflex and rifampin. A CT scan was ordered but I don't think was ever done due to artifacts from hardware. Her wound culture was negative. She has been using iodoform packing. She states her knee is painful when she stands on it for a period of time. She is not systemically unwell. She has a history of scleroderma or without is been under control for some period of time. Electronic Signature(s) Signed: 10/28/2016 4:57:22 PM By: Linton Ham MD Entered By: Linton Ham on 10/28/2016 09:40:13 [name] ([id number]) -------------------------------------------------------------------------------- Physical Exam Details [name]. Date of Service: 10/28/2016 8:00 AM Medical Record Patient Account Number: [id number] Number: Treating RN: Cornell Barman 11/12/1933 (83 y.o. Other Clinician: Date of Birth/Sex: Female) Treating ROBSON, MICHAEL Primary Care Physician/Extender: Ambrose Mantle, [place] Physician: Referring Physician: LITTLE, RACHEL Weeks in Treatment: 0 Constitutional Sitting or standing Blood Pressure is within target range for patient.. Pulse regular and within target range for patient.Marland Kitchen Respirations regular, non-labored and within target range.. Temperature is normal and within the target range for the patient.. Patient's appearance is neat and clean. Appears in no acute distress. Well nourished and well developed.. Eyes Conjunctivae clear. No  discharge.Marland Kitchen Respiratory Respiratory effort is easy and symmetric bilaterally. Rate is normal at rest and on room air.. Cardiovascular Heart rhythm and rate regular, without murmur or gallop.. Pedal pulses palpable and strong bilaterally.Marland Kitchen  Gastrointestinal (GI) Abdomen is soft and non-distended without masses or tenderness. Bowel sounds active in all quadrants.. Lymphatic Palpable in the popliteal or inguinal area. Musculoskeletal The left knee itself is swollen and warm. Perhaps a slight effusion but no overt tenderness.. Notes Wound exam; the patient has a small draining area in the popliteal fossa on the left knee. This measured 5.4 cm however when I inserted a culture swab I think it goes down quite a bit more than that. There is no evidence of surrounding tenderness. The drainage look like yellow sinoatrial fluid Electronic Signature(s) Signed: 10/28/2016 4:57:22 PM By: Linton Ham MD Entered By: Linton Ham on 10/28/2016 09:42:01 Swift, Blima Singer ([id number]) -------------------------------------------------------------------------------- Physician Orders Details [name]. Date of Service: 10/28/2016 8:00 AM Medical Record Patient Account Number: [id number] Number: Treating RN: Cornell Barman November 16, 1933 (83 y.o. Other Clinician: Date of Birth/Sex: Female) Treating ROBSON, MICHAEL Primary Care Physician/Extender: Ambrose Mantle, [place] Physician: Referring Physician: LITTLE, RACHEL Weeks in Treatment: 0 Verbal / Phone Orders: No Diagnosis Coding Wound Cleansing Wound #1 Left,Posterior Knee o Clean wound with Normal Saline. Anesthetic Wound #1 Left,Posterior Knee o Topical Lidocaine 4% cream applied to wound bed prior to debridement Primary Wound Dressing Wound #1 Left,Posterior Knee o Iodoform packing Gauze Secondary Dressing Wound #1 Left,Posterior Knee o Boardered Foam Dressing Dressing Change Frequency Wound #1 Left,Posterior  Knee o Change dressing every day. Follow-up Appointments Wound #1 Left,Posterior Knee o Return Appointment in 1 week. Edema Control Wound #1 Left,Posterior Knee o Elevate legs to the level of the heart and pump ankles as often as possible Additional Orders / Instructions Wound #1 Left,Posterior Knee o Increase protein intake. MHIA, MATHRE ([id number]) Laboratory o Bacteria identified in Wound by Culture (MICRO) - Left posterior knee oooo LOINC Code: [id number] oooo Convenience Name: Wound culture routine Electronic Signature(s) Signed: 10/28/2016 9:53:50 AM By: Gretta Cool, RN, BSN, Kim RN, BSN Signed: 10/28/2016 4:57:22 PM By: Linton Ham MD Entered By: Gretta Cool, RN, BSN, Kim on 10/28/2016 09:05:37 [name] ([id number]) -------------------------------------------------------------------------------- Problem List Details Patient Name: SHABANA, MOSCHELLA. Date of Service: 10/28/2016 8:00 AM Medical Record Patient Account Number: [id number] Number: Treating RN: Cornell Barman 31-Mar-1934 (82 y.o. Other Clinician: Date of Birth/Sex: Female) Treating ROBSON, MICHAEL Primary Care Physician/Extender: Ambrose Mantle, [place] Physician: Referring Physician: LITTLE, RACHEL Weeks in Treatment: 0 Active Problems ICD-10 Encounter Code Description Active Date Diagnosis M71.22 Synovial cyst of popliteal space [Baker], left knee 10/28/2016 Yes B95.61 Methicillin susceptible Staphylococcus aureus infection as 10/28/2016 Yes the cause of diseases classified elsewhere L02.416 Cutaneous abscess of left lower limb 10/28/2016 Yes Inactive Problems Resolved Problems Electronic Signature(s) Signed: 10/28/2016 4:57:22 PM By: Linton Ham MD Entered By: Linton Ham on 10/28/2016 09:30:59 Heber, Blima Singer ([id number]) -------------------------------------------------------------------------------- Progress Note/History and Physical Details [name]. Date of Service: 10/28/2016  8:00 AM Medical Record Patient Account Number: [id number] Number: Treating RN: Cornell Barman 1934/05/16 (82 y.o. Other Clinician: Date of Birth/Sex: Female) Treating ROBSON, MICHAEL Primary Care Physician/Extender: Ambrose Mantle, [place] Physician: Referring Physician: LITTLE, RACHEL Weeks in Treatment: 0 Subjective Chief Complaint Information obtained from Patient 10/28/16; patient is here for review of a draining area in her left popliteal fossa that is been present for 8 days History of Present Illness (HPI) 10/28/16; this is an 83 year old woman who arrives with a complicated medical issue. 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 Initially in 2000 and apparently most recently in 2014. Unfortunately she appears to have  had septic arthritis of the artificial joint. Indeed she was hospitalized on 02/13/16 and discharged on 02/19/16. She was taken to the OR and it was found that the popliteal abscess was communicating with her joint space. Signed to feel fluid grew MSSA. Patient was treated with IandD of the abscess. She completed IV cefazolin on 03/28/16 and since then has been on Keflex and rifampin for chronic suppression. Her infectious disease doctor is Dr[facility]restha at [facility]. The patient was seen in our [place] sister clinic through much of October and November 2017. At that point she had a draining sinus that eventually closed over. She noted some pain behind her knee surrounding Christmas day and was seen in our emergency room on 10/23/16. During this ER visit she underwent tendon incision and drainage of a left Baker's cyst and clear colored synovial fluid was obtained. Doxycycline was added for a week to her Keflex and rifampin. A CT scan was ordered but I don't think was ever done due to artifacts from hardware. Her wound culture was negative. She has been using iodoform packing. She states her knee is painful when she stands on it for a period of time. She is  not systemically unwell. She has a history of scleroderma or without is been under control for some period of time. Wound History Patient presents with 1 open wound that has been present for approximately 2weeks. Patient has been treating wound in the following manner: packing. Laboratory tests have been performed in the last month. Patient reportedly has not tested positive for an antibiotic resistant organism. Patient reportedly has not tested positive for osteomyelitis. Patient reportedly has not had testing performed to evaluate circulation in the legs. Patient History Information obtained from Patient. Allergies Zithromax, Sulfonylureas, prednisone, adhesive tape SHON, HEADD ([id number]) Family History Cancer - Mother, Diabetes - Siblings, Father, Heart Disease - Father, Hypertension - Father, Seizures - Child, No family history of Kidney Disease, Lung Disease, Stroke, Thyroid Problems, Tuberculosis. Social History Never smoker, Marital Status - Widowed, Alcohol Use - Rarely, Drug Use - No History, Caffeine Use - Never. Medical History Eyes Patient has history of Cataracts - Removed Denies history of Glaucoma, Optic Neuritis Ear/Nose/Mouth/Throat Denies history of Chronic sinus problems/congestion, Middle ear problems Hematologic/Lymphatic Denies history of Anemia, Hemophilia, Human Immunodeficiency Virus, Lymphedema Respiratory Patient has history of Sleep Apnea - 2L O2 at night Denies history of Aspiration, Asthma, Chronic Obstructive Pulmonary Disease (COPD), Pneumothorax, Tuberculosis Cardiovascular Patient has history of Arrhythmia, Hypertension Denies history of Angina, Congestive Heart Failure, Coronary Artery Disease, Deep Vein Thrombosis, Hypotension, Myocardial Infarction, Peripheral Arterial Disease, Peripheral Venous Disease, Phlebitis, Vasculitis Gastrointestinal Denies history of Cirrhosis , Colitis, Crohn s, Hepatitis A, Hepatitis B, Hepatitis  C Endocrine Denies history of Type I Diabetes, Type II Diabetes Genitourinary Denies history of End Stage Renal Disease Immunological Patient has history of Scleroderma Denies history of Lupus Erythematosus, Raynaud s Integumentary (Skin) Denies history of History of Burn, History of pressure wounds Musculoskeletal Denies history of Gout, Rheumatoid Arthritis, Osteoarthritis, Osteomyelitis Neurologic Denies history of Dementia, Neuropathy, Quadriplegia, Paraplegia, Seizure Disorder Oncologic Denies history of Received Chemotherapy, Received Radiation Psychiatric Denies history of Anorexia/bulimia, Confinement Anxiety Review of Systems (ROS) Constitutional Symptoms (General Health) The patient has no complaints or symptoms. COUA, [id number] ([id number]) Eyes Complains or has symptoms of Glasses / Contacts. Denies complaints or symptoms of Dry Eyes, Vision Changes. Ear/Nose/Mouth/Throat The patient has no complaints or symptoms. Hematologic/Lymphatic The patient has no complaints or symptoms. Respiratory The patient has no  complaints or symptoms. Cardiovascular Complains or has symptoms of LE edema - Knee surgery. Gastrointestinal The patient has no complaints or symptoms. Endocrine The patient has no complaints or symptoms. Genitourinary Complains or has symptoms of Incontinence/dribbling. Denies complaints or symptoms of Kidney failure/ Dialysis. Immunological The patient has no complaints or symptoms. Integumentary (Skin) Complains or has symptoms of Wounds. Denies complaints or symptoms of Bleeding or bruising tendency, Breakdown, Swelling. Musculoskeletal The patient has no complaints or symptoms. Neurologic The patient has no complaints or symptoms. Oncologic The patient has no complaints or symptoms. Psychiatric The patient has no complaints or symptoms. Objective Constitutional Sitting or standing Blood Pressure is within target range for patient.. Pulse  regular and within target range for patient.Marland Kitchen Respirations regular, non-labored and within target range.. Temperature is normal and within the target range for the patient.. Patient's appearance is neat and clean. Appears in no acute distress. Well nourished and well developed.. Vitals Time Taken: 8:20 AM, Height: 66 in, Source: Stated, Weight: 209 lbs, Source: Stated, BMI: 33.7, Temperature: 97.7 F, Pulse: 55 bpm, Respiratory Rate: 16 breaths/min, Blood Pressure: 134/59 mmHg. Eyes OZELL, LEREW. ([id number]) Conjunctivae clear. No discharge.Marland Kitchen Respiratory Respiratory effort is easy and symmetric bilaterally. Rate is normal at rest and on room air.. Cardiovascular Heart rhythm and rate regular, without murmur or gallop.. Pedal pulses palpable and strong bilaterally.. Gastrointestinal (GI) Abdomen is soft and non-distended without masses or tenderness. Bowel sounds active in all quadrants.. Lymphatic Palpable in the popliteal or inguinal area. Musculoskeletal The left knee itself is swollen and warm. Perhaps a slight effusion but no overt tenderness.. General Notes: Wound exam; the patient has a small draining area in the popliteal fossa on the left knee. This measured 5.4 cm however when I inserted a culture swab I think it goes down quite a bit more than that. There is no evidence of surrounding tenderness. The drainage look like yellow sinoatrial fluid Integumentary (Hair, Skin) Wound #1 status is Open. Original cause of wound was Gradually Appeared. The wound is located on the Left,Posterior Knee. The wound measures 0.5cm length x 0.7cm width x 0.3cm depth; 0.275cm^2 area and 0.082cm^3 volume. There is tunneling at 6:00 with a maximum distance of 5.5cm. There is a large amount of serous drainage noted. The wound margin is flat and intact. The periwound skin appearance exhibited: Scarring, Moist. The periwound skin appearance did not exhibit: Callus, Crepitus, Excoriation,  Fluctuance, Friable, Induration, Localized Edema, Rash, Dry/Scaly, Maceration, Atrophie Blanche, Cyanosis, Ecchymosis, Hemosiderin Staining, Mottled, Pallor, Rubor, Erythema. General Notes: Unable to visualize wound bed dt size/depth of wound Assessment Active Problems ICD-10 M71.22 - Synovial cyst of popliteal space [Baker], left knee B95.61 - Methicillin susceptible Staphylococcus aureus infection as the cause of diseases classified elsewhere L02.416 - Cutaneous abscess of left lower limb DIXI, DIFELICE. ([id number]) Plan Wound Cleansing: Wound #1 Left,Posterior Knee: Clean wound with Normal Saline. Anesthetic: Wound #1 Left,Posterior Knee: Topical Lidocaine 4% cream applied to wound bed prior to debridement Primary Wound Dressing: Wound #1 Left,Posterior Knee: Iodoform packing Gauze Secondary Dressing: Wound #1 Left,Posterior Knee: Boardered Foam Dressing Dressing Change Frequency: Wound #1 Left,Posterior Knee: Change dressing every day. Follow-up Appointments: Wound #1 Left,Posterior Knee: Return Appointment in 1 week. Edema Control: Wound #1 Left,Posterior Knee: Elevate legs to the level of the heart and pump ankles as often as possible Additional Orders / Instructions: Wound #1 Left,Posterior Knee: Increase protein intake. Laboratory ordered were: Wound culture routine - Left posterior knee o #1 I see no reason  to change the current dressing which is iodoform packing #2 I don't see this as having anything to do with a "Baker's cyst" I suspect this draining area has direct communication with a chronically infected prosthetic left knee joint. The this is what was found during her last hospitalization at [facility]. Culture at that time grew MSSA and that is the reason for the Keflex and rifampin. #3 apparently the patient is been told at [facility] the if she got a severe infection again she would require an above knee the amputation. I don't see this currently as a "severe  infection" although any connection like MCKENNA, [place]. ([id number]) this with the knee joint has reason for significant concern. I did not change the doxycycline abdomen started in the ER. As mentioned the culture there was negative. She has an infectious disease follow-up on 10/31/16. #4 I did a deep culture with a culture swab. As noted I think this goes down deep or then what we actually measured on her intake. The knee itself is warm. I suspect she has a chronically infected left total knee replacement in the draining sinuses actually in next site Electronic Signature(s) Signed: 10/28/2016 4:57:22 PM By: Linton Ham MD Entered By: Linton Ham on 10/28/2016 09:45:51 Sedler, Blima Singer ([id number]) -------------------------------------------------------------------------------- ROS/PFSH Details [name]. Date of Service: 10/28/2016 8:00 AM Medical Record Patient Account Number: [id number] Number: Treating RN: Cornell Barman 01/10/1934 (82 y.o. Other Clinician: Date of Birth/Sex: Female) Treating ROBSON, MICHAEL Primary Care Physician/Extender: Ambrose Mantle, [place] Physician: Referring Physician: LITTLE, RACHEL Weeks in Treatment: 0 Label Progress Note Print Version as History and Physical for this encounter Information Obtained From Patient Wound History Do you currently have one or more open woundso Yes How many open wounds do you currently haveo 1 Approximately how long have you had your woundso 2weeks How have you been treating your wound(s) until nowo packing Has your wound(s) ever healed and then re-openedo No Have you had any lab work done in the past montho Yes Who ordered the lab work [place] [facility] Have you tested positive for an antibiotic resistant organism (MRSA, VRE)o No Have you tested positive for osteomyelitis (bone infection)o No Have you had any tests for circulation on your legso No Eyes Complaints and Symptoms: Positive for: Glasses /  Contacts Negative for: Dry Eyes; Vision Changes Medical History: Positive for: Cataracts - Removed Negative for: Glaucoma; Optic Neuritis Cardiovascular Complaints and Symptoms: Positive for: LE edema - Knee surgery Medical History: Positive for: Arrhythmia; Hypertension Negative for: Angina; Congestive Heart Failure; Coronary Artery Disease; Deep Vein Thrombosis; Hypotension; Myocardial Infarction; Peripheral Arterial Disease; Peripheral Venous Disease; Phlebitis; Vasculitis Genitourinary MAKENZE, BRAMANTE ([id number]) Complaints and Symptoms: Positive for: Incontinence/dribbling Negative for: Kidney failure/ Dialysis Medical History: Negative for: End Stage Renal Disease Integumentary (Skin) Complaints and Symptoms: Positive for: Wounds Negative for: Bleeding or bruising tendency; Breakdown; Swelling Medical History: Negative for: History of Burn; History of pressure wounds Constitutional Symptoms (General Health) Complaints and Symptoms: No Complaints or Symptoms Ear/Nose/Mouth/Throat Complaints and Symptoms: No Complaints or Symptoms Medical History: Negative for: Chronic sinus problems/congestion; Middle ear problems Hematologic/Lymphatic Complaints and Symptoms: No Complaints or Symptoms Medical History: Negative for: Anemia; Hemophilia; Human Immunodeficiency Virus; Lymphedema Respiratory Complaints and Symptoms: No Complaints or Symptoms Medical History: Positive for: Sleep Apnea - 2L O2 at night Negative for: Aspiration; Asthma; Chronic Obstructive Pulmonary Disease (COPD); Pneumothorax; Tuberculosis Gastrointestinal Complaints and Symptoms: No Complaints or Symptoms AKERAH, SENTER. ([id number]) Medical History: Negative for: Cirrhosis ;  Colitis; Crohnos; Hepatitis A; Hepatitis B; Hepatitis C Endocrine Complaints and Symptoms: No Complaints or Symptoms Medical History: Negative for: Type I Diabetes; Type II Diabetes Immunological Complaints and  Symptoms: No Complaints or Symptoms Medical History: Positive for: Scleroderma Negative for: Lupus Erythematosus; Raynaudos Musculoskeletal Complaints and Symptoms: No Complaints or Symptoms Medical History: Negative for: Gout; Rheumatoid Arthritis; Osteoarthritis; Osteomyelitis Neurologic Complaints and Symptoms: No Complaints or Symptoms Medical History: Negative for: Dementia; Neuropathy; Quadriplegia; Paraplegia; Seizure Disorder Oncologic Complaints and Symptoms: No Complaints or Symptoms Medical History: Negative for: Received Chemotherapy; Received Radiation Psychiatric Complaints and Symptoms: No Complaints or Symptoms Medical History: Negative for: Anorexia/bulimia; Confinement Anxiety QUNESHA, WASHABAUGH ([id number]) HBO Extended History Items Eyes: Cataracts Immunizations Pneumococcal Vaccine: Received Pneumococcal Vaccination: Yes Family and Social History Cancer: Yes - Mother; Diabetes: Yes - Siblings, Father; Heart Disease: Yes - Father; Hypertension: Yes - Father; Kidney Disease: No; Lung Disease: No; Seizures: Yes - Child; Stroke: No; Thyroid Problems: No; Tuberculosis: No; Never smoker; Marital Status - Widowed; Alcohol Use: Rarely; Drug Use: No History; Caffeine Use: Never; Financial Concerns: No; Food, Clothing or Shelter Needs: No; Support System Lacking: No; Transportation Concerns: No; Advanced Directives: Yes (Not Provided); Patient does not want information on Advanced Directives; Do not resuscitate: No; Living Will: Yes (Not Provided); Medical Power of Attorney: Yes - Santiago Glad (Not Provided) Electronic Signature(s) Signed: 10/28/2016 9:53:50 AM By: Gretta Cool RN, BSN, Kim RN, BSN Signed: 10/28/2016 4:57:22 PM By: Linton Ham MD Entered By: Gretta Cool RN, BSN, Kim on 10/28/2016 08:43:55 [name] ([id number]) -------------------------------------------------------------------------------- [place] Details Patient Name: BETTI, DORIN. Date of Service:  10/28/2016 Medical Record Patient Account Number: [id number] Number: Treating RN: Cornell Barman 03/19/1934 (82 y.o. Other Clinician: Date of Birth/Sex: Female) Treating ROBSON, MICHAEL Primary Care Physician/Extender: Ambrose Mantle, [place] Physician: Weeks in Treatment: 0 Referring Physician: LITTLE, RACHEL Diagnosis Coding ICD-10 Codes Code Description M71.22 Synovial cyst of popliteal space [Baker], left knee Methicillin susceptible Staphylococcus aureus infection as the cause of diseases B95.61 classified elsewhere L02.416 Cutaneous abscess of left lower limb Facility Procedures CPT4 Code: [id number] Description: 99214 - WOUND CARE VISIT-LEV 4 EST PT Modifier: Quantity: 1 Physician Procedures CPT4: Description Modifier Quantity Code [id number] - WC PHYS LEVEL 4 - EST PT 1 ICD-10 Description Diagnosis L02.416 Cutaneous abscess of left lower limb B95.61 Methicillin susceptible Staphylococcus aureus infection as the cause of diseases  classified elsewhere M71.22 Synovial cyst of popliteal space [Baker], left knee Electronic Signature(s) Signed: 10/28/2016 4:57:22 PM By: Linton Ham MD Entered By: Linton Ham on 10/28/2016 09:47:08

## 2016-10-29 NOTE — Progress Notes (Signed)
EZRAH, RITTENOUR ([id number]) Visit Report for 10/28/2016 Allergy List Details [name], [name]. Date of Service: 10/28/2016 8:00 AM Medical Record Patient Account Number: [id number] Number: Treating RN: Cornell Barman Dec 27, 1933 (82 y.o. Other Clinician: Date of Birth/Sex: Female) Treating ROBSON, MICHAEL Primary Care Physician: Loura Pardon Physician/Extender: G Referring Physician: LITTLE, RACHEL Weeks in Treatment: 0 Allergies Active Allergies Zithromax Sulfonylureas prednisone adhesive tape Allergy Notes Electronic Signature(s) Signed: 10/28/2016 9:53:50 AM By: Gretta Cool, RN, BSN, Kim RN, BSN Entered By: Gretta Cool, RN, BSN, Kim on 10/28/2016 08:33:17 [name] ([id number]) -------------------------------------------------------------------------------- Arrival Information Details [name]. Date of Service: 10/28/2016 8:00 AM Medical Record Patient Account Number: [id number] Number: Treating RN: Cornell Barman 28-Jan-1934 (82 y.o. Other Clinician: Date of Birth/Sex: Female) Treating ROBSON, MICHAEL Primary Care Physician: Loura Pardon Physician/Extender: G Referring Physician: Cristela Felt in Treatment: 0 Visit Information Patient Arrived: Cane Arrival Time: 08:17 Accompanied By: daughter, [name] Transfer Assistance: None Patient Identification Verified: Yes Secondary Verification Process Yes Completed: Patient Has Alerts: Yes Patient Alerts: ABI 08/07/16 (R) 1.3 (L) 1.19 Electronic Signature(s) Signed: 10/28/2016 9:53:50 AM By: Gretta Cool, RN, BSN, Kim RN, BSN Entered By: Gretta Cool, RN, BSN, Kim on 10/28/2016 08:19:39 [name] ([id number]) -------------------------------------------------------------------------------- Clinic Level of Care Assessment Details [name]. Date of Service: 10/28/2016 8:00 AM Medical Record Patient Account Number: [id number] Number: Treating RN: Cornell Barman 04/19/1934 (82 y.o. Other Clinician: Date of Birth/Sex: Female) Treating ROBSON, MICHAEL Primary Care Physician: Loura Pardon Physician/Extender: G Referring Physician: LITTLE, RACHEL Weeks in Treatment: 0 Clinic Level of Care Assessment Items TOOL 2 Quantity Score []  - Use when only an EandM is performed on the INITIAL visit 0 ASSESSMENTS - Nursing Assessment / Reassessment X - General Physical Exam (combine w/ comprehensive assessment (listed just 1 20 below) when performed on new pt. evals) X - Comprehensive Assessment (HX, ROS, Risk Assessments, Wounds Hx, etc.) 1 25 ASSESSMENTS - Wound and Skin Assessment / Reassessment X - Simple Wound Assessment / Reassessment - one wound 1 5 []  - Complex Wound Assessment / Reassessment - multiple wounds 0 []  - Dermatologic / Skin Assessment (not related to wound area) 0 ASSESSMENTS - Ostomy and/or Continence Assessment and Care []  - Incontinence Assessment and Management 0 []  - Ostomy Care Assessment and Management (repouching, etc.) 0 PROCESS - Coordination of Care X - Simple Patient / Family Education for ongoing care 1 15 []  - Complex (extensive) Patient / Family Education for ongoing care 0 X - Staff obtains Programmer, systems, Records, Test Results / Process Orders 1 10 []  - Staff telephones HHA, Nursing Homes / Clarify orders / etc 0 []  - Routine Transfer to another Facility (non-emergent condition) 0 []  - Routine Hospital Admission (non-emergent condition) 0 []  - New Admissions / Biomedical engineer / Ordering NPWT, Apligraf, etc. 0 []  - Emergency Hospital Admission (emergent condition) 0 Damiano, [name] M. ([id number]) X - Simple Discharge Coordination 1 10 []  - Complex (extensive) Discharge Coordination 0 PROCESS - Special Needs []  - Pediatric / Minor Patient Management 0 []  - Isolation Patient Management 0 []  - Hearing / Language / Visual special needs 0 []  - Assessment of Community assistance (transportation, D/C planning, etc.)  0 []  - Additional assistance / Altered mentation 0 []  - Support Surface(s) Assessment (bed, cushion, seat, etc.) 0 INTERVENTIONS - Wound Cleansing / Measurement X - Wound Imaging (photographs - any number of wounds) 1 5 []  - Wound Tracing (instead of photographs) 0 X -  Simple Wound Measurement - one wound 1 5 []  - Complex Wound Measurement - multiple wounds 0 X - Simple Wound Cleansing - one wound 1 5 []  - Complex Wound Cleansing - multiple wounds 0 INTERVENTIONS - Wound Dressings X - Small Wound Dressing one or multiple wounds 1 10 []  - Medium Wound Dressing one or multiple wounds 0 []  - Large Wound Dressing one or multiple wounds 0 []  - Application of Medications - injection 0 INTERVENTIONS - Miscellaneous []  - External ear exam 0 X - Specimen Collection (cultures, biopsies, blood, body fluids, etc.) 1 5 X - Specimen(s) / Culture(s) sent or taken to Lab for analysis 1 5 []  - Patient Transfer (multiple staff / Harrel Lemon Lift / Similar devices) 0 []  - Simple Staple / Suture removal (25 or less) 0 [name], [name] M. ([id number]) []  - Complex Staple / Suture removal (26 or more) 0 []  - Hypo / Hyperglycemic Management (close monitor of Blood Glucose) 0 []  - Ankle / Brachial Index (ABI) - do not check if billed separately 0 Has the patient been seen at the hospital within the last three years: Yes Total Score: 120 Level Of Care: New/Established - Level 4 Electronic Signature(s) Signed: 10/28/2016 9:53:50 AM By: Gretta Cool, RN, BSN, Kim RN, BSN Entered By: Gretta Cool, RN, BSN, Kim on 10/28/2016 09:07:21 [name] ([id number]) -------------------------------------------------------------------------------- Encounter Discharge Information Details [name]. Date of Service: 10/28/2016 8:00 AM Medical Record Patient Account Number: [id number] Number: Treating RN: Cornell Barman 12-11-1933 (82 y.o. Other Clinician: Date of Birth/Sex: Female) Treating ROBSON, MICHAEL Primary  Care Physician: Loura Pardon Physician/Extender: G Referring Physician: LITTLE, RACHEL Weeks in Treatment: 0 Encounter Discharge Information Items Discharge Pain Level: 0 Discharge Condition: Stable Ambulatory Status: Cane Discharge Destination: Home Transportation: Private Auto Accompanied By: daughter Schedule Follow-up Appointment: Yes Medication Reconciliation completed Yes and provided to Patient/Care Ciena Sampley: Provided on Clinical Summary of Care: 10/28/2016 Form Type Recipient Paper Patient FW Electronic Signature(s) Signed: 10/28/2016 9:16:13 AM By: Ruthine Dose Entered By: Ruthine Dose on 10/28/2016 09:16:13 [name] ([id number]) -------------------------------------------------------------------------------- Lower Extremity Assessment Details [name]. Date of Service: 10/28/2016 8:00 AM Medical Record Patient Account Number: [id number] Number: Treating RN: Cornell Barman January 13, 1934 (82 y.o. Other Clinician: Date of Birth/Sex: Female) Treating ROBSON, [place] Primary Care Physician: Loura Pardon Physician/Extender: G Referring Physician: LITTLE, RACHEL Weeks in Treatment: 0 Vascular Assessment Pulses: Dorsalis Pedis Palpable: [Left:Yes] Posterior Tibial Extremity colors, hair growth, and conditions: Extremity Color: [Left:Hyperpigmented] Hair Growth on Extremity: [Left:No] Temperature of Extremity: [Left:Warm] Capillary Refill: [Left:< 3 seconds] Dependent Rubor: [Left:No] Blanched when Elevated: [Left:No] Lipodermatosclerosis: [Left:No] Toe Nail Assessment Left: Right: Thick: No Discolored: No Deformed: No Improper Length and Hygiene: No Electronic Signature(s) Signed: 10/28/2016 9:53:50 AM By: Gretta Cool, RN, BSN, Kim RN, BSN Entered By: Gretta Cool, RN, BSN, Kim on 10/28/2016 08:28:53 [name] ([id number]) -------------------------------------------------------------------------------- Multi Wound Chart Details Patient  Name: [name]. Date of Service: 10/28/2016 8:00 AM Medical Record Patient Account Number: [id number] Number: Treating RN: Cornell Barman 1934-04-25 (82 y.o. Other Clinician: Date of Birth/Sex: Female) Treating ROBSON, MICHAEL Primary Care Physician: Loura Pardon Physician/Extender: G Referring Physician: LITTLE, RACHEL Weeks in Treatment: 0 Vital Signs Height(in): 66 Pulse(bpm): 55 Weight(lbs): 209 Blood Pressure 134/59 (mmHg): Body Mass Index(BMI): 34 Temperature(F): 97.7 Respiratory Rate 16 (breaths/min): Photos: [N/A:N/A] Wound Location: Left Knee - Posterior N/A N/A Wounding Event: Gradually Appeared N/A N/A Primary Etiology: Cyst N/A N/A Comorbid History: Cataracts, Sleep Apnea, N/A N/A Arrhythmia,  Hypertension, Scleroderma Date Acquired: 10/22/2016 N/A N/A Weeks of Treatment: 0 N/A N/A Wound Status: Open N/A N/A Measurements L x W x D 0.5x0.7x0.3 N/A N/A (cm) Area (cm) : 0.275 N/A N/A Volume (cm) : 0.082 N/A N/A % Reduction in Area: 0.00% N/A N/A % Reduction in Volume: 0.00% N/A N/A Position 1 (o'clock): 6 Maximum Distance 1 5.5 (cm): Tunneling: Yes N/A N/A Classification: Full Thickness Without N/A N/A Exposed Support Structures Exudate Amount: Large N/A N/A [name], [name] ([id number]) Exudate Type: Serous N/A N/A Exudate Color: amber N/A N/A Wound Margin: Flat and Intact N/A N/A Epithelialization: None N/A N/A Periwound Skin Texture: Scarring: Yes N/A N/A Edema: No Excoriation: No Induration: No Callus: No Crepitus: No Fluctuance: No Friable: No Rash: No Periwound Skin Moist: Yes N/A N/A Moisture: Maceration: No Dry/Scaly: No Periwound Skin Color: Atrophie Blanche: No N/A N/A Cyanosis: No Ecchymosis: No Erythema: No Hemosiderin Staining: No Mottled: No Pallor: No Rubor: No Tenderness on No N/A N/A Palpation: Wound Preparation: Ulcer Cleansing: N/A N/A Rinsed/Irrigated with Saline Topical Anesthetic Applied: Other:  lidocaine 4% Assessment Notes: Unable to visualize wound N/A N/A bed dt size/depth of wound Treatment Notes Wound #1 (Left, Posterior Knee) 1. Cleansed with: Clean wound with Normal Saline 2. Anesthetic Topical Lidocaine 4% cream to wound bed prior to debridement 4. Dressing Applied: Iodoform packing Gauze 5. Secondary Dressing Applied Bordered Foam Dressing [name], [name] ([id number]) Electronic Signature(s) Signed: 10/28/2016 4:57:22 PM By: Linton Ham MD Entered By: Linton Ham on 10/28/2016 09:31:11 [name] ([id number]) -------------------------------------------------------------------------------- [place] Details [name], [name]. Date of Service: 10/28/2016 8:00 AM Medical Record Patient Account Number: [id number] Number: Treating RN: Cornell Barman 01-05-1934 (82 y.o. Other Clinician: Date of Birth/Sex: Female) Treating ROBSON, [employer] Primary Care Physician: Loura Pardon Physician/Extender: G Referring Physician: LITTLE, RACHEL Weeks in Treatment: 0 Active Inactive Abuse / Safety / Falls / Self Care Management Nursing Diagnoses: Impaired physical mobility Potential for falls Goals: Patient will remain injury free Date Initiated: 10/28/2016 Goal Status: Active Interventions: Assess: immobility, friction, shearing, incontinence upon admission and as needed Notes: Orientation to the Wound Care Program Nursing Diagnoses: Knowledge deficit related to the wound healing center program Goals: Patient/caregiver will verbalize understanding of the [place] Program Date Initiated: 10/28/2016 Goal Status: Active Interventions: Provide education on orientation to the wound center Notes: Soft Tissue Infection Nursing Diagnoses: Impaired tissue integrity Potential for infection: soft tissue [name], [name] ([id number]) Goals: Patient will remain free of wound infection Date Initiated: 10/28/2016 Goal  Status: Active Interventions: Assess signs and symptoms of infection every visit Treatment Activities: Culture and sensitivity : 10/28/2016 Notes: Wound/Skin Impairment Nursing Diagnoses: Impaired tissue integrity Goals: Ulcer/skin breakdown will heal within 14 weeks Date Initiated: 10/28/2016 Goal Status: Active Interventions: Assess ulceration(s) every visit Notes: Electronic Signature(s) Signed: 10/28/2016 9:53:50 AM By: Gretta Cool, RN, BSN, Kim RN, BSN Entered By: Gretta Cool, RN, BSN, Kim on 10/28/2016 09:03:31 [name] ([id number]) -------------------------------------------------------------------------------- Pain Assessment Details [name]. Date of Service: 10/28/2016 8:00 AM Medical Record Patient Account Number: [id number] Number: Treating RN: Cornell Barman [birth year] (82 y.o. Other Clinician: Date of Birth/Sex: Female) Treating ROBSON, MICHAEL Primary Care Physician: Loura Pardon Physician/Extender: G Referring Physician: LITTLE, RACHEL Weeks in Treatment: 0 Active Problems Location of Pain Severity and Description of Pain Patient Has Paino No Site Locations With Dressing Change: No Pain Management and Medication Current Pain Management: Goals for Pain Management Topical or injectable lidocaine is offered to patient  for acute pain when surgical debridement is performed. If needed, Patient is instructed to use over the counter pain medication for the following 24-48 hours after debridement. Wound care MDs do not prescribed pain medications. Patient has chronic pain or uncontrolled pain. Patient has been instructed to make an appointment with their Primary Care Physician for pain management. Electronic Signature(s) Signed: 10/28/2016 9:53:50 AM By: Gretta Cool, RN, BSN, Kim RN, BSN Entered By: Gretta Cool, RN, BSN, Kim on 10/28/2016 08:20:09 [name]  ([id number]) -------------------------------------------------------------------------------- Patient/Caregiver Education Details [name], [name]. Date of Service: 10/28/2016 8:00 AM Medical Record Patient Account Number: [id number] Number: Treating RN: Cornell Barman 1934-01-31 (82 y.o. Other Clinician: Date of Birth/Gender: Female) Treating ROBSON, MICHAEL Primary Care Physician: Loura Pardon Physician/Extender: G Referring Physician: Cristela Felt in Treatment: 0 Education Assessment Education Provided To: Patient Education Topics Provided Wound/Skin Impairment: Handouts: Caring for Your Ulcer Methods: Demonstration Responses: State content correctly Electronic Signature(s) Signed: 10/28/2016 9:53:50 AM By: Gretta Cool, RN, BSN, Kim RN, BSN Entered By: Gretta Cool, RN, BSN, Kim on 10/28/2016 09:11:38 [name] ([id number]) -------------------------------------------------------------------------------- Wound Assessment Details [name]. Date of Service: 10/28/2016 8:00 AM Medical Record Patient Account Number: [id number] Number: Treating RN: Cornell Barman 16-Sep-1934 (82 y.o. Other Clinician: Date of Birth/Sex: Female) Treating ROBSON, MICHAEL Primary Care Physician: Loura Pardon Physician/Extender: G Referring Physician: LITTLE, RACHEL Weeks in Treatment: 0 Wound Status Wound Number: 1 Primary Cyst Etiology: Wound Location: Left Knee - Posterior Wound Open Wounding Event: Gradually Appeared Status: Date Acquired: 10/22/2016 Comorbid Cataracts, Sleep Apnea, Arrhythmia, Weeks Of Treatment: 0 History: Hypertension, Scleroderma Clustered Wound: No Photos Wound Measurements Length: (cm) 0.5 Width: (cm) 0.7 Depth: (cm) 0.3 Area: (cm) 0.275 Volume: (cm) 0.082 % Reduction in Area: 0% % Reduction in Volume: 0% Epithelialization: None Tunneling: Yes Position (o'clock): 6 Maximum Distance: (cm) 5.5 Wound  Description Full Thickness Without Exposed Foul Odor Afte Classification: Support Structures Wound Margin: Flat and Intact Exudate Large Amount: Exudate Type: Serous Exudate Color: amber r Cleansing: No Periwound Skin Texture Texture Color No Abnormalities Noted: No No Abnormalities Noted: No [name], [name] ([id number]) Callus: No Atrophie Blanche: No Crepitus: No Cyanosis: No Excoriation: No Ecchymosis: No Fluctuance: No Erythema: No Friable: No Hemosiderin Staining: No Induration: No Mottled: No Localized Edema: No Pallor: No Rash: No Rubor: No Scarring: Yes Moisture No Abnormalities Noted: No Dry / Scaly: No Maceration: No Moist: Yes Wound Preparation Ulcer Cleansing: Rinsed/Irrigated with Saline Topical Anesthetic Applied: Other: lidocaine 4%, Assessment Notes Unable to visualize wound bed dt size/depth of wound Treatment Notes Wound #1 (Left, Posterior Knee) 1. Cleansed with: Clean wound with Normal Saline 2. Anesthetic Topical Lidocaine 4% cream to wound bed prior to debridement 4. Dressing Applied: Iodoform packing Gauze 5. Secondary Dressing Applied Bordered Foam Dressing Electronic Signature(s) Signed: 10/28/2016 9:53:50 AM By: Gretta Cool, RN, BSN, Kim RN, BSN Entered By: Gretta Cool, RN, BSN, Kim on 10/28/2016 09:06:05 [name] ([id number]) -------------------------------------------------------------------------------- Vitals Details [name]. Date of Service: 10/28/2016 8:00 AM Medical Record Patient Account Number: [id number] Number: Treating RN: Cornell Barman 1933-10-30 (82 y.o. Other Clinician: Date of Birth/Sex: Female) Treating ROBSON, [place] Primary Care Physician: Loura Pardon Physician/Extender: G Referring Physician: LITTLE, RACHEL Weeks in Treatment: 0 Vital Signs Time Taken: 08:20 Temperature (F): 97.7 Height (in): 66 Pulse (bpm): 55 Source: Stated Respiratory Rate (breaths/min): 16 Weight (lbs):  209 Blood Pressure (mmHg): 134/59 Source: Stated Reference Range: 80 - 120 mg / dl Body Mass Index (  BMI): 33.7 Electronic Signature(s) Signed: 10/28/2016 9:53:50 AM By: Gretta Cool, RN, BSN, Kim RN, BSN Entered By: Gretta Cool, RN, BSN, Kim on 10/28/2016 08:20:43

## 2016-11-01 LAB — AEROBIC CULTURE  (SUPERFICIAL SPECIMEN)

## 2016-11-01 LAB — AEROBIC CULTURE W GRAM STAIN (SUPERFICIAL SPECIMEN)

## 2016-11-04 ENCOUNTER — Telehealth: Payer: Self-pay

## 2016-11-04 ENCOUNTER — Ambulatory Visit: Payer: Self-pay | Admitting: Internal Medicine

## 2016-11-04 ENCOUNTER — Telehealth: Payer: Self-pay | Admitting: Internal Medicine

## 2016-11-04 NOTE — Telephone Encounter (Signed)
Dianne (DPR signed) wants to ck on how pt is to take metoprolol; ? If changed at 12/03/15 when saw Dr Rayann Heman.  Dianne was transferred by phone to Dr Jackalyn Lombard office.

## 2016-11-04 NOTE — Telephone Encounter (Signed)
[name] is calling to get clarification on her mother's medication Metoprolol , the pharmacy says its 25mg  twice a day and the paper she has says 12.5 mg 's twice a day . Please call at [phone].. Thanks

## 2016-11-04 NOTE — Telephone Encounter (Signed)
Spoke with patient and let her know that the last OV we had her decreasing to 12.5 mg bid per Dr Rayann Heman but looks as though Dr [name] has been adjusting also

## 2016-11-05 ENCOUNTER — Encounter: Payer: Medicare Other | Admitting: Internal Medicine

## 2016-11-05 ENCOUNTER — Ambulatory Visit: Payer: Self-pay | Admitting: Adult Health

## 2016-11-05 DIAGNOSIS — L02416 Cutaneous abscess of left lower limb: Secondary | ICD-10-CM | POA: Diagnosis not present

## 2016-11-06 NOTE — Progress Notes (Signed)
TONJI, GARRETT ([id number]) Visit Report for 11/05/2016 Chief Complaint Document Details [name], [name]. Date of Service: 11/05/2016 12:45 PM Medical Record Patient Account Number: [id number] Number: Treating RN: Baruch Gouty, RN, BSN, Rita [birth year] (83 y.o. Other Clinician: Date of Birth/Sex: Female) Treating Shaquira Moroz Primary Care Physician/Extender: Ambrose Mantle, [place] Physician: Referring Physician: Loura Pardon Weeks in Treatment: 1 Information Obtained from: Patient Chief Complaint 10/28/16; patient is here for review of a draining area in her left popliteal fossa that is been present for 8 days Electronic Signature(s) Signed: 11/05/2016 6:05:53 PM By: Linton Ham MD Entered By: Linton Ham on 11/05/2016 13:30:02 [name] ([id number]) -------------------------------------------------------------------------------- HPI Details [name]. Date of Service: 11/05/2016 12:45 PM Medical Record Patient Account Number: [id number] Number: Treating RN: Baruch Gouty, RN, BSN, Rita [birth year] (83 y.o. Other Clinician: Date of Birth/Sex: Female) Treating Freada Twersky Primary Care Physician/Extender: Ambrose Mantle, [place] Physician: Referring Physician: Loura Pardon Weeks in Treatment: 1 History of Present Illness HPI Description: 10/28/16; this is an 83 year old woman who arrives with a complicated medical issue. 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 Initially in 2000 and apparently most recently in 2014. 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 Indeed she was hospitalized on 02/13/16 and discharged on 02/19/16. She was taken to the OR and it was found that the popliteal abscess was communicating with her joint space. Signed to feel fluid grew MSSA. Patient was treated with IandD of the abscess. She completed IV cefazolin on 03/28/16 and since then has been on Keflex and rifampin  for chronic suppression. Her infectious disease doctor is Dr.Shrestha at [facility]. The patient was seen in our [place] sister clinic through much of October and November 2017. At that point she had a draining sinus that eventually closed over. She noted some pain behind her knee surrounding Christmas day and was seen in our emergency room on 10/23/16. During this ER visit she underwent tendon incision and drainage of a left Baker's cyst and clear colored synovial fluid was obtained. Doxycycline was added for a week to her Keflex and rifampin. A CT scan was ordered but I don't think was ever done due to artifacts from hardware. Her wound culture was negative. She has been using iodoform packing. She states her knee is painful when she stands on it for a period of time. She is not systemically unwell. She has a history of scleroderma or without is been under control for some period of time. 11/04/16 the culture that I did of this area last week showed coag-negative staph and a few enterococcus faecium. Enterococcus is ampicillin resistant. The coag negative staph is only sensitive to vancomycin and tetracycline. Previous joint infection was methicillin sensitive staph aureus. It seems I misunderstood what she said and that her infectious disease clinic appointment is this Friday and the orthopedic appointment was last Friday. I faxed the culture results to the infectious disease clinic but I've given him a copy of it today. Per the patient and her family they orthopedic surgeon did not want to do anything further to this joint. The patient is not systemically unwell in particular no fever or chills and the pain she has is really minimal Electronic Signature(s) Signed: 11/05/2016 6:05:53 PM By: Linton Ham MD Entered By: Linton Ham on 11/05/2016 13:32:57 [name] ([id number]) -------------------------------------------------------------------------------- Physical Exam  Details [name]. Date of Service: 11/05/2016 12:45 PM Medical Record Patient  Account Number: [id number] Number: Treating RN: Baruch Gouty, RN, BSN, Rita 1934/03/13 (83 y.o. Other Clinician: Date of Birth/Sex: Female) Treating Travanti Mcmanus Primary Care Physician/Extender: Ambrose Mantle, [place] Physician: Referring Physician: Loura Pardon Weeks in Treatment: 1 Constitutional Sitting or standing Blood Pressure is within target range for patient.. Pulse regular and within target range for patient.Marland Kitchen Respirations regular, non-labored and within target range.. Temperature is normal and within the target range for the patient.. Patient's appearance is neat and clean. Appears in no acute distress. Well nourished and well developed.. Eyes Conjunctivae clear. No discharge.Marland Kitchen Respiratory Respiratory effort is easy and symmetric bilaterally. Rate is normal at rest and on room air.. Cardiovascular Pedal pulses palpable and strong bilaterally.. Lymphatic Nonpalpable in the popliteal or inguinal area. Musculoskeletal The left knee which is the site of her most recent joint replacement there is swollen and warm. There is clearly an effusion here the sinuses actually in the popliteal fossa. Notes Wound exam; the patient's "wound" is in the popliteal fossa on the left. This probes 5 cm today per our intake nurse. I got about the same thing last week. This has clear yellow fluid which looks like synovial fluid Electronic Signature(s) Signed: 11/05/2016 6:05:53 PM By: Linton Ham MD Entered By: Linton Ham on 11/05/2016 13:35:08 [name] ([id number]) -------------------------------------------------------------------------------- Physician Orders Details [name]. Date of Service: 11/05/2016 12:45 PM Medical Record Patient Account Number: [id number] Number: Treating RN: Baruch Gouty, RN, BSN, Rita Apr 26, 1934 (83 y.o. Other Clinician: Date of  Birth/Sex: Female) Treating Wenceslao Loper Primary Care Physician/Extender: Ambrose Mantle, [place] Physician: Referring Physician: Loura Pardon Weeks in Treatment: 1 Verbal / Phone Orders: No Diagnosis Coding Wound Cleansing Wound #1 Left,Posterior Knee o Clean wound with Normal Saline. Primary Wound Dressing Wound #1 Left,Posterior Knee o Iodoform packing Gauze Secondary Dressing Wound #1 Left,Posterior Knee o Dry Gauze o Boardered Foam Dressing Dressing Change Frequency Wound #1 Left,Posterior Knee o Change dressing every day. Follow-up Appointments Wound #1 Left,Posterior Knee o Return Appointment in 1 week. Edema Control Wound #1 Left,Posterior Knee o Elevate legs to the level of the heart and pump ankles as often as possible Additional Orders / Instructions Wound #1 Left,Posterior Knee o Increase protein intake. Electronic Signature(s) Signed: 11/05/2016 4:54:43 PM By: Regan Lemming BSN, RN [name] ([id number]) Signed: 11/05/2016 6:05:53 PM By: Linton Ham MD Entered By: Regan Lemming on 11/05/2016 13:02:54 ESPERANZA, DOMINY ([id number]) -------------------------------------------------------------------------------- Problem List Details Patient Name: PESSY, CHEATHAM. Date of Service: 11/05/2016 12:45 PM Medical Record Patient Account Number: [id number] Number: Treating RN: Baruch Gouty, RN, BSN, Rita 11-25-1933 (83 y.o. Other Clinician: Date of Birth/Sex: Female) Treating Mickelle Goupil Primary Care Physician/Extender: Ambrose Mantle, [place] Physician: Referring Physician: Loura Pardon Weeks in Treatment: 1 Active Problems ICD-10 Encounter Code Description Active Date Diagnosis M71.22 Synovial cyst of popliteal space [Baker], left knee 10/28/2016 Yes B95.61 Methicillin susceptible Staphylococcus aureus infection as 10/28/2016 Yes the cause of diseases classified elsewhere L02.416 Cutaneous abscess of left lower limb 10/28/2016 Yes Inactive  Problems Resolved Problems Electronic Signature(s) Signed: 11/05/2016 6:05:53 PM By: Linton Ham MD Entered By: Linton Ham on 11/05/2016 13:29:31 [name] ([id number]) -------------------------------------------------------------------------------- Progress Note Details [name]. Date of Service: 11/05/2016 12:45 PM Medical Record Patient Account Number: [id number] Number: Treating RN: Baruch Gouty, RN, BSN, Rita Feb 08, 1934 (83 y.o. Other Clinician: Date of Birth/Sex: Female) Treating Windi Toro Primary Care Physician/Extender: Ambrose Mantle, [place] Physician: Referring Physician: Loura Pardon Weeks in Treatment: 1 Subjective Chief Complaint  Information obtained from Patient 10/28/16; patient is here for review of a draining area in her left popliteal fossa that is been present for 8 days History of Present Illness (HPI) 10/28/16; this is an 83 year old woman who arrives with a complicated medical issue. 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 Initially in 2000 and apparently most recently in 2014. 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 Indeed she was hospitalized on 02/13/16 and discharged on 02/19/16. She was taken to the OR and it was found that the popliteal abscess was communicating with her joint space. Signed to feel fluid grew MSSA. Patient was treated with IandD of the abscess. She completed IV cefazolin on 03/28/16 and since then has been on Keflex and rifampin for chronic suppression. Her infectious disease doctor is Dr[facility]Shrestha at [facility]. The patient was seen in our [place] sister clinic through much of October and November 2017. At that point she had a draining sinus that eventually closed over. She noted some pain behind her knee surrounding Christmas day and was seen in our emergency room on 10/23/16. During this ER visit she underwent tendon incision and drainage of a left  Baker's cyst and clear colored synovial fluid was obtained. Doxycycline was added for a week to her Keflex and rifampin. A CT scan was ordered but I don't think was ever done due to artifacts from hardware. Her wound culture was negative. She has been using iodoform packing. She states her knee is painful when she stands on it for a period of time. She is not systemically unwell. She has a history of scleroderma or without is been under control for some period of time. 11/04/16 the culture that I did of this area last week showed coag-negative staph and a few enterococcus faecium. Enterococcus is ampicillin resistant. The coag negative staph is only sensitive to vancomycin and tetracycline. Previous joint infection was methicillin sensitive staph aureus. It seems I misunderstood what she said and that her infectious disease clinic appointment is this Friday and the orthopedic appointment was last Friday. I faxed the culture results to the infectious disease clinic but I've given him a copy of it today. Per the patient and her family they orthopedic surgeon did not want to do anything further to this joint. The patient is not systemically unwell in particular no fever or chills and the pain she has is really minimal TYLI, BARBANO. ([id number]) Objective Constitutional Sitting or standing Blood Pressure is within target range for patient.. Pulse regular and within target range for patient.Marland Kitchen Respirations regular, non-labored and within target range.. Temperature is normal and within the target range for the patient.. Patient's appearance is neat and clean. Appears in no acute distress. Well nourished and well developed.. Vitals Time Taken: 12:47 PM, Height: 66 in, Weight: 209 lbs, BMI: 33.7, Temperature: 97.7 F, Pulse: 51 bpm, Respiratory Rate: 16 breaths/min, Blood Pressure: 123/61 mmHg. Eyes Conjunctivae clear. No discharge.Marland Kitchen Respiratory Respiratory effort is easy and symmetric bilaterally.  Rate is normal at rest and on room air.. Cardiovascular Pedal pulses palpable and strong bilaterally.. Lymphatic Nonpalpable in the popliteal or inguinal area. Musculoskeletal The left knee which is the site of her most recent joint replacement there is swollen and warm. There is clearly an effusion here the sinuses actually in the popliteal fossa. General Notes: Wound exam; the patient's "wound" is in the popliteal fossa on the left. This probes 5 cm today per our intake nurse. I got about  the same thing last week. This has clear yellow fluid which looks like synovial fluid Integumentary (Hair, Skin) Wound #1 status is Open. Original cause of wound was Gradually Appeared. The wound is located on the Left,Posterior Knee. The wound measures 0.5cm length x 0.5cm width x 5cm depth; 0.196cm^2 area and 0.982cm^3 volume. There is no tunneling or undermining noted. There is a large amount of serous drainage noted. The wound margin is flat and intact. There is no granulation within the wound bed. There is no necrotic tissue within the wound bed. The periwound skin appearance exhibited: Scarring, Moist. The periwound skin appearance did not exhibit: Callus, Crepitus, Excoriation, Fluctuance, Friable, Induration, Localized Edema, Rash, Dry/Scaly, Maceration, Atrophie Blanche, Cyanosis, Ecchymosis, Hemosiderin Staining, Mottled, Pallor, Rubor, Erythema. Periwound temperature was noted as No Abnormality. Assessment BRITTENY, GODFREY ([id number]) Active Problems ICD-10 M71.22 - Synovial cyst of popliteal space [Baker], left knee B95.61 - Methicillin susceptible Staphylococcus aureus infection as the cause of diseases classified elsewhere L02.416 - Cutaneous abscess of left lower limb Plan Wound Cleansing: Wound #1 Left,Posterior Knee: Clean wound with Normal Saline. Primary Wound Dressing: Wound #1 Left,Posterior Knee: Iodoform packing Gauze Secondary Dressing: Wound #1 Left,Posterior Knee: Dry  Gauze Boardered Foam Dressing Dressing Change Frequency: Wound #1 Left,Posterior Knee: Change dressing every day. Follow-up Appointments: Wound #1 Left,Posterior Knee: Return Appointment in 1 week. Edema Control: Wound #1 Left,Posterior Knee: Elevate legs to the level of the heart and pump ankles as often as possible Additional Orders / Instructions: Wound #1 Left,Posterior Knee: Increase protein intake. o ROKHAYA, PATCHELL. 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 The swab culture that I did last week shows enterococcus and coag-negative staph. I don't think there are a long list of good options here. Her I think the patient should go through with a arthrocentesis for culture etc. it isn't apparent to me that she would elect to go forward with an elective total knee replacement again, I am not sure whether a course of IV vancomycin situation would be something that infectious disease would recommend. #2 I think her wound is actually an exit site for a chronically infected left total knee replacement. Electronic Signature(s) Signed: 11/05/2016 6:05:53 PM By: Linton Ham MD Entered By: Linton Ham on 11/05/2016 13:38:22 [name] ([id number]) -------------------------------------------------------------------------------- SuperBill Details [name]. Date of Service: 11/05/2016 Medical Record Patient Account Number: [id number] Number: Treating RN: Baruch Gouty, RN, BSN, Rita 18-Jun-1934 [phone] y.o. Other Clinician: Date of Birth/Sex: Female) Treating Taelynn Mcelhannon Primary Care Physician/Extender: Ambrose Mantle, [place] Physician: Weeks in Treatment: 1 Referring Physician: Loura Pardon Diagnosis Coding ICD-10 Codes Code Description M71.22 Synovial cyst of popliteal space [Baker], left knee Methicillin susceptible Staphylococcus aureus infection as the cause of diseases B95.61 classified elsewhere L02.416  Cutaneous abscess of left lower limb Facility Procedures CPT4 Code: [id number] Description: [id number] - WOUND CARE VISIT-LEV 2 EST PT Modifier: Quantity: 1 Physician Procedures CPT4: Description Modifier Quantity Code [id number] - WC PHYS LEVEL 3 - EST PT 1 ICD-10 Description Diagnosis L02.416 Cutaneous abscess of left lower limb B95.61 Methicillin susceptible Staphylococcus aureus infection as the cause of diseases  classified elsewhere Electronic Signature(s) Signed: 11/05/2016 6:05:53 PM By: Linton Ham MD Entered By: Linton Ham on 11/05/2016 13:38:48

## 2016-11-06 NOTE — Progress Notes (Signed)
[name], [name] ([id number]) Visit Report for 11/05/2016 Arrival Information Details [name], [name]. Date of Service: 11/05/2016 12:45 PM Medical Record Patient Account Number: [id number] Number: Treating RN: Baruch Gouty, RN, BSN, Rita 02/27/1934 (83 y.o. Other Clinician: Date of Birth/Sex: Female) Treating ROBSON, MICHAEL Primary Care Physician: [name] Physician/Extender: G Referring Physician: Loura [name] Weeks in Treatment: 1 Visit Information History Since Last Visit All ordered tests and consults were completed: No Patient Arrived: Wheel Chair Added or deleted any medications: No Arrival Time: 12:42 Any new allergies or adverse reactions: No Accompanied By: dtr Had a fall or experienced change in No Transfer Assistance: None activities of daily living that may affect Patient Identification Verified: Yes risk of falls: Secondary Verification Process Yes Signs or symptoms of abuse/neglect since last No Completed: visito Patient Has Alerts: Yes Hospitalized since last visit: No Patient Alerts: ABI 08/07/16 Has Dressing in Place as Prescribed: Yes (R) 1.3 (L) Pain Present Now: Yes 1.19 Electronic Signature(s) Signed: 11/05/2016 4:54:43 PM By: Regan Lemming BSN, RN Entered By: Regan Lemming on 11/05/2016 12:44:41 [name] ([id number]) -------------------------------------------------------------------------------- Clinic Level of Care Assessment Details [name]. Date of Service: 11/05/2016 12:45 PM Medical Record Patient Account Number: [id number] Number: Treating RN: Baruch Gouty, RN, BSN, Rita 05-07-1934 (83 y.o. Other Clinician: Date of Birth/Sex: Female) Treating ROBSON, [place] Primary Care Physician: [name] Physician/Extender: G Referring Physician: Loura [name] Weeks in Treatment: 1 Clinic Level of Care Assessment Items TOOL 4 Quantity Score []  - Use when only an EandM is performed on FOLLOW-UP visit  0 ASSESSMENTS - Nursing Assessment / Reassessment X - Reassessment of Co-morbidities (includes updates in patient status) 1 10 X - Reassessment of Adherence to Treatment Plan 1 5 ASSESSMENTS - Wound and Skin Assessment / Reassessment X - Simple Wound Assessment / Reassessment - one wound 1 5 []  - Complex Wound Assessment / Reassessment - multiple wounds 0 []  - Dermatologic / Skin Assessment (not related to wound area) 0 ASSESSMENTS - Focused Assessment []  - Circumferential Edema Measurements - multi extremities 0 []  - Nutritional Assessment / Counseling / Intervention 0 []  - Lower Extremity Assessment (monofilament, tuning fork, pulses) 0 []  - Peripheral Arterial Disease Assessment (using hand held doppler) 0 ASSESSMENTS - Ostomy and/or Continence Assessment and Care []  - Incontinence Assessment and Management 0 []  - Ostomy Care Assessment and Management (repouching, etc.) 0 PROCESS - Coordination of Care X - Simple Patient / Family Education for ongoing care 1 15 []  - Complex (extensive) Patient / Family Education for ongoing care 0 []  - Staff obtains Programmer, systems, Records, Test Results / Process Orders 0 []  - Staff telephones HHA, Nursing Homes / Clarify orders / etc 0 [name], [name] ([id number]) []  - Routine Transfer to another Facility (non-emergent condition) 0 []  - Routine Hospital Admission (non-emergent condition) 0 []  - New Admissions / Biomedical engineer / Ordering NPWT, Apligraf, etc. 0 []  - Emergency Hospital Admission (emergent condition) 0 []  - Simple Discharge Coordination 0 []  - Complex (extensive) Discharge Coordination 0 PROCESS - Special Needs []  - Pediatric / Minor Patient Management 0 []  - Isolation Patient Management 0 []  - Hearing / Language / Visual special needs 0 []  - Assessment of Community assistance (transportation, D/C planning, etc.) 0 []  - Additional assistance / Altered mentation 0 []  - Support Surface(s) Assessment (bed, cushion, seat, etc.)  0 INTERVENTIONS - Wound Cleansing / Measurement X - Simple Wound Cleansing - one wound 1 5 []  - Complex Wound Cleansing -  multiple wounds 0 X - Wound Imaging (photographs - any number of wounds) 1 5 []  - Wound Tracing (instead of photographs) 0 X - Simple Wound Measurement - one wound 1 5 []  - Complex Wound Measurement - multiple wounds 0 INTERVENTIONS - Wound Dressings X - Small Wound Dressing one or multiple wounds 1 10 []  - Medium Wound Dressing one or multiple wounds 0 []  - Large Wound Dressing one or multiple wounds 0 []  - Application of Medications - topical 0 []  - Application of Medications - injection 0 [name], [name] ([id number]) INTERVENTIONS - Miscellaneous []  - External ear exam 0 []  - Specimen Collection (cultures, biopsies, blood, body fluids, etc.) 0 []  - Specimen(s) / Culture(s) sent or taken to Lab for analysis 0 []  - Patient Transfer (multiple staff / Harrel Lemon Lift / Similar devices) 0 []  - Simple Staple / Suture removal (25 or less) 0 []  - Complex Staple / Suture removal (26 or more) 0 []  - Hypo / Hyperglycemic Management (close monitor of Blood Glucose) 0 []  - Ankle / Brachial Index (ABI) - do not check if billed separately 0 X - Vital Signs 1 5 Has the patient been seen at the hospital within the last three years: Yes Total Score: 65 Level Of Care: New/Established - Level 2 Electronic Signature(s) Signed: 11/05/2016 4:54:43 PM By: Regan Lemming BSN, RN Entered By: Regan Lemming on 11/05/2016 13:03:41 [name] ([id number]) -------------------------------------------------------------------------------- Encounter Discharge Information Details [name]. Date of Service: 11/05/2016 12:45 PM Medical Record Patient Account Number: [id number] Number: Treating RN: Baruch Gouty, RN, BSN, Rita [birth year] (83 y.o. Other Clinician: Date of Birth/Sex: Female) Treating ROBSON, MICHAEL Primary Care Physician: [name] Physician/Extender:  G Referring Physician: Loura [name] Weeks in Treatment: 1 Encounter Discharge Information Items Discharge Pain Level: 0 Discharge Condition: Stable Ambulatory Status: Wheelchair Discharge Destination: Home Transportation: Private Auto Accompanied By: dtr Schedule Follow-up Appointment: No Medication Reconciliation completed and provided to Patient/Care No Layliana Devins: Provided on Clinical Summary of Care: 11/05/2016 Form Type Recipient Paper Patient FW Electronic Signature(s) Signed: 11/05/2016 1:21:32 PM By: Ruthine Dose Entered By: Ruthine Dose on 11/05/2016 13:21:31 [name] ([id number]) -------------------------------------------------------------------------------- Lower Extremity Assessment Details [name]. Date of Service: 11/05/2016 12:45 PM Medical Record Patient Account Number: [id number] Number: Treating RN: Baruch Gouty, RN, BSN, Rita 1934-10-05 (83 y.o. Other Clinician: Date of Birth/Sex: Female) Treating ROBSON, MICHAEL Primary Care Physician: [name] Physician/Extender: G Referring Physician: Loura [name] Weeks in Treatment: 1 Vascular Assessment Pulses: Dorsalis Pedis Palpable: [Left:Yes] Posterior Tibial Extremity colors, hair growth, and conditions: Extremity Color: [Left:Hyperpigmented] Hair Growth on Extremity: [Left:No] Temperature of Extremity: [Left:Warm] Capillary Refill: [Left:< 3 seconds] Electronic Signature(s) Signed: 11/05/2016 4:54:43 PM By: Regan Lemming BSN, RN Entered By: Regan Lemming on 11/05/2016 12:47:15 [name] ([id number]) -------------------------------------------------------------------------------- Multi Wound Chart Details [name]. Date of Service: 11/05/2016 12:45 PM Medical Record Patient Account Number: [id number] Number: Treating RN: Baruch Gouty, RN, BSN, Rita 1934-01-20 (83 y.o. Other Clinician: Date of Birth/Sex: Female) Treating ROBSON, MICHAEL Primary  Care Physician: [name] Physician/Extender: G Referring Physician: Loura [name] Weeks in Treatment: 1 Vital Signs Height(in): 66 Pulse(bpm): 51 Weight(lbs): 209 Blood Pressure 123/61 (mmHg): Body Mass Index(BMI): 34 Temperature(F): 97.7 Respiratory Rate 16 (breaths/min): Photos: [1:No Photos] [N/A:N/A] Wound Location: [1:Left Knee - Posterior] [N/A:N/A] Wounding Event: [1:Gradually Appeared] [N/A:N/A] Primary Etiology: [1:Cyst] [N/A:N/A] Comorbid History: [1:Cataracts, Sleep Apnea, N/A Arrhythmia, Hypertension, Scleroderma] Date Acquired: [1:10/22/2016] [N/A:N/A] Weeks of Treatment: [1:1] [N/A:N/A]  Wound Status: [1:Open] [N/A:N/A] Measurements L x W x D 0.5x0.5x5 [N/A:N/A] (cm) Area (cm) : [1:0.196] [N/A:N/A] Volume (cm) : [1:0.982] [N/A:N/A] % Reduction in Area: [1:28.70%] [N/A:N/A] % Reduction in Volume: -1097.60% [N/A:N/A] Classification: [1:Full Thickness Without Exposed Support Structures] [N/A:N/A] Exudate Amount: [1:Large] [N/A:N/A] Exudate Type: [1:Serous] [N/A:N/A] Exudate Color: [1:amber] [N/A:N/A] Wound Margin: [1:Flat and Intact] [N/A:N/A] Granulation Amount: [1:None Present (0%)] [N/A:N/A] Necrotic Amount: [1:None Present (0%)] [N/A:N/A] Epithelialization: [1:None] [N/A:N/A] Periwound Skin Texture: Scarring: Yes [1:Edema: No] [N/A:N/A] Excoriation: No Induration: No Callus: No Crepitus: No Fluctuance: No Friable: No Rash: No Periwound Skin Moist: Yes N/A N/A Moisture: Maceration: No Dry/Scaly: No Periwound Skin Color: Atrophie Blanche: No N/A N/A Cyanosis: No Ecchymosis: No Erythema: No Hemosiderin Staining: No Mottled: No Pallor: No Rubor: No Temperature: No Abnormality N/A N/A Tenderness on No N/A N/A Palpation: Wound Preparation: Ulcer Cleansing: N/A N/A Rinsed/Irrigated with Saline Topical Anesthetic Applied: None Treatment Notes Wound #1 (Left, Posterior Knee) 4. Dressing Applied: Iodoform packing Gauze 5. Secondary  Dressing Applied Bordered Foam Dressing Dry Gauze Electronic Signature(s) Signed: 11/05/2016 6:05:53 PM By: Linton Ham MD Entered By: Linton Ham on 11/05/2016 13:29:49 [name] ([id number]) -------------------------------------------------------------------------------- [facility] Details [name], [name]. Date of Service: 11/05/2016 12:45 PM Medical Record Patient Account Number: [id number] Number: Treating RN: Baruch Gouty, RN, BSN, Rita 08-09-1934 (83 y.o. Other Clinician: Date of Birth/Sex: Female) Treating ROBSON, [place] Primary Care Physician: [name] Physician/Extender: G Referring Physician: Loura [name] Weeks in Treatment: 1 Active Inactive Abuse / Safety / Falls / Self Care Management Nursing Diagnoses: Impaired physical mobility Potential for falls Goals: Patient will remain injury free Date Initiated: 10/28/2016 Goal Status: Active Interventions: Assess: immobility, friction, shearing, incontinence upon admission and as needed Notes: Orientation to the Wound Care Program Nursing Diagnoses: Knowledge deficit related to the wound healing center program Goals: Patient/caregiver will verbalize understanding of the [facility] Program Date Initiated: 10/28/2016 Goal Status: Active Interventions: Provide education on orientation to the wound center Notes: Soft Tissue Infection Nursing Diagnoses: Impaired tissue integrity Potential for infection: soft tissue [name], [name] ([id number]) Goals: Patient will remain free of wound infection Date Initiated: 10/28/2016 Goal Status: Active Interventions: Assess signs and symptoms of infection every visit Treatment Activities: Culture and sensitivity : 10/28/2016 Notes: Wound/Skin Impairment Nursing Diagnoses: Impaired tissue integrity Goals: Ulcer/skin breakdown will heal within 14 weeks Date Initiated: 10/28/2016 Goal Status: Active Interventions: Assess  ulceration(s) every visit Notes: Electronic Signature(s) Signed: 11/05/2016 4:54:43 PM By: Regan Lemming BSN, RN Entered By: Regan Lemming on 11/05/2016 13:01:25 [name] ([id number]) -------------------------------------------------------------------------------- Pain Assessment Details [name]. Date of Service: 11/05/2016 12:45 PM Medical Record Patient Account Number: [id number] Number: Treating RN: Baruch Gouty, RN, BSN, Rita 05-30-1934 (83 y.o. Other Clinician: Date of Birth/Sex: Female) Treating ROBSON, MICHAEL Primary Care Physician: [name] Physician/Extender: G Referring Physician: Loura [name] Weeks in Treatment: 1 Active Problems Location of Pain Severity and Description of Pain Patient Has Paino Yes Site Locations Pain Location: Generalized Pain, Pain in Ulcers With Dressing Change: Yes Rate the pain. Current Pain Level: 4 Character of Pain Describe the Pain: Tender Pain Management and Medication Current Pain Management: Medication: Yes Rest: Yes How does your pain impact your activities of daily livingo Sleep: Yes Bathing: Yes Appetite: Yes Relationship With Others: Yes Bladder Continence: Yes Emotions: Yes Bowel Continence: Yes Work: Yes Toileting: Yes Drive: Yes Dressing: Yes Hobbies: Astronomer) Signed: 11/05/2016 4:54:43 PM By: Regan Lemming BSN, RN Entered  ByRegan Lemming on 11/05/2016 12:45:01 [name] ([id number]) [name], [name] ([id number]) -------------------------------------------------------------------------------- Patient/Caregiver Education Details [name]. Date of Service: 11/05/2016 12:45 PM Medical Record Patient Account Number: [id number] Number: Treating RN: Baruch Gouty, RN, BSN, Rita Feb 13, 1934 (83 y.o. Other Clinician: Date of Birth/Gender: Female) Treating ROBSON, MICHAEL Primary Care Physician: [name] Physician/Extender: G Referring  Physician: Ayesha Mohair in Treatment: 1 Education Assessment Education Provided To: Patient Education Topics Provided Welcome To [facility]: Methods: Explain/Verbal Responses: State content correctly Electronic Signature(s) Signed: 11/05/2016 4:54:43 PM By: Regan Lemming BSN, RN Entered By: Regan Lemming on 11/05/2016 13:05:04 [name] ([id number]) -------------------------------------------------------------------------------- Wound Assessment Details [name]. Date of Service: 11/05/2016 12:45 PM Medical Record Patient Account Number: [id number] Number: Treating RN: Baruch Gouty, RN, BSN, Rita 11-23-1933 (83 y.o. Other Clinician: Date of Birth/Sex: Female) Treating ROBSON, MICHAEL Primary Care Physician: [name] Physician/Extender: G Referring Physician: Loura [name] Weeks in Treatment: 1 Wound Status Wound Number: 1 Primary Cyst Etiology: Wound Location: Left Knee - Posterior Wound Open Wounding Event: Gradually Appeared Status: Date Acquired: 10/22/2016 Comorbid Cataracts, Sleep Apnea, Arrhythmia, Weeks Of Treatment: 1 History: Hypertension, Scleroderma Clustered Wound: No Photos Photo Uploaded By: Regan Lemming on 11/05/2016 17:14:02 Wound Measurements Length: (cm) 0.5 Width: (cm) 0.5 Depth: (cm) 5 Area: (cm) 0.196 Volume: (cm) 0.982 % Reduction in Area: 28.7% % Reduction in Volume: -1097.6% Epithelialization: None Tunneling: No Undermining: No Wound Description Full Thickness Without Exposed Classification: Support Structures Wound Margin: Flat and Intact Exudate Large Amount: Exudate Type: Serous Exudate Color: amber Foul Odor After Cleansing: No Wound Bed Granulation Amount: None Present (0%) [name], [name] ([id number]) Necrotic Amount: None Present (0%) Periwound Skin Texture Texture Color No Abnormalities Noted: No No Abnormalities Noted: No Callus: No Atrophie Blanche: No Crepitus:  No Cyanosis: No Excoriation: No Ecchymosis: No Fluctuance: No Erythema: No Friable: No Hemosiderin Staining: No Induration: No Mottled: No Localized Edema: No Pallor: No Rash: No Rubor: No Scarring: Yes Temperature / Pain Moisture Temperature: No Abnormality No Abnormalities Noted: No Dry / Scaly: No Maceration: No Moist: Yes Wound Preparation Ulcer Cleansing: Rinsed/Irrigated with Saline Topical Anesthetic Applied: None Treatment Notes Wound #1 (Left, Posterior Knee) 4. Dressing Applied: Iodoform packing Gauze 5. Secondary Dressing Applied Bordered Foam Dressing Dry Gauze Electronic Signature(s) Signed: 11/05/2016 4:54:43 PM By: Regan Lemming BSN, RN Entered By: Regan Lemming on 11/05/2016 13:01:00 [name] ([id number]) -------------------------------------------------------------------------------- Vitals Details [name]. Date of Service: 11/05/2016 12:45 PM Medical Record Patient Account Number: [id number] Number: Treating RN: Baruch Gouty, RN, BSN, Rita December 15, 1933 (83 y.o. Other Clinician: Date of Birth/Sex: Female) Treating ROBSON, [place] Primary Care Physician: [name] Physician/Extender: G Referring Physician: Loura [name] Weeks in Treatment: 1 Vital Signs Time Taken: 12:47 Temperature (F): 97.7 Height (in): 66 Pulse (bpm): 51 Weight (lbs): 209 Respiratory Rate (breaths/min): 16 Body Mass Index (BMI): 33.7 Blood Pressure (mmHg): 123/61 Reference Range: 80 - 120 mg / dl Electronic Signature(s) Signed: 11/05/2016 4:54:43 PM By: Regan Lemming BSN, RN Entered By: Regan Lemming on 11/05/2016 12:48:06

## 2016-11-10 ENCOUNTER — Ambulatory Visit: Payer: Self-pay | Admitting: Adult Health

## 2016-11-11 ENCOUNTER — Ambulatory Visit (INDEPENDENT_AMBULATORY_CARE_PROVIDER_SITE_OTHER): Payer: Medicare Other | Admitting: Adult Health

## 2016-11-11 ENCOUNTER — Encounter: Payer: Self-pay | Admitting: Adult Health

## 2016-11-11 DIAGNOSIS — R053 Chronic cough: Secondary | ICD-10-CM

## 2016-11-11 DIAGNOSIS — I272 Pulmonary hypertension, unspecified: Secondary | ICD-10-CM | POA: Diagnosis not present

## 2016-11-11 DIAGNOSIS — J9611 Chronic respiratory failure with hypoxia: Secondary | ICD-10-CM | POA: Diagnosis not present

## 2016-11-11 DIAGNOSIS — G4733 Obstructive sleep apnea (adult) (pediatric): Secondary | ICD-10-CM

## 2016-11-11 DIAGNOSIS — R05 Cough: Secondary | ICD-10-CM | POA: Diagnosis not present

## 2016-11-11 NOTE — Progress Notes (Signed)
@Patient  ID: [name], female    DOB: 1934/01/27, 83 y.o.   MRN: [id number]  Chief Complaint  Patient presents with  . Follow-up    OSA / nocturnal O2     Referring provider: Tower, Wynelle Fanny, MD  HPI: 83 year old female followed for obstructive sleep apnea, pulmonary hypertension and chronic hypoxic respiratory failure with nocturnal oxygen, cor pulmonale. Atrial fib status post ablation May 2016 History of PE and DVT in 2013 after left knee replacement.  TEST  RHC (06/2014)  RA mean 6  PA 43/12, mean 25  PCWP mean 16  PVR 1.6 WU  CI 2.9   PFTs (05/25/14) FEV1 1.88 L (88%) FVC 2.22 L (77%) ratio 114, DLCO 68%   HRCT chest 01/2015 mild bronchiectasis right lower lobe VQ 05/30/14: Low prob   PSG 06/2014 >> severe OSA, AHI 62 per hour, corrected by CPAP 12 cm with small fullface mask. Central apneas emergent higher pressures  ONO on CPPA/RA shows 71 min desatn -Add 2L O2 to CPAP ONO on CPAP/2 L O2 >>no desatn  Echo 4/18/16demonstrated EF 55%, mild MR, moderate TR, PA pressure 57, RV severely dilated, small pericardial effusion, LA 37.   11/11/2016 Follow up : Nocturnal Hypoxia /OSA/Pulmonary HTN, Cor pulmonale  Pt returns for 3 months follow up .  She has known severe OSA but has not been able to tolerate CPAP after multiple trials. She has turned in her machine . She is on O2 At bedtime  At 2l/m . ONO on 2l showed very little desats and she has been kept on 2l/m  Says she is doing okay.  She has chronic cough that she says is doing okay wihtout flare . Uses delsym As needed    She has Cor pulmonale .  On HCTZ . Denies increased leg swelling, increased DOE or orthopnea,.      Allergies  Allergen Reactions  . Prednisone Other (See Comments)    hyperctivity  . Zithromax [Azithromycin] Other (See Comments)    Prolonged QT on EKG  . Penicillins Hives and Swelling    Questionable allergy (?) Currently on cefazolin, tolerating well since 4/21 Patient is  tolerating dicloxacillin without issue. No skin changes or other concerns.   . Sulfonamide Derivatives Hives  . Adhesive [Tape] Other (See Comments) and Rash    redness    Immunization History  Administered Date(s) Administered  . Influenza Split 07/29/2011  . Influenza Whole 08/14/2005, 07/28/2007, 07/14/2008, 07/26/2009, 07/16/2010  . Influenza,inj,Quad PF,36+ Mos 07/19/2013, 07/14/2014, 07/11/2015, 06/25/2016  . Pneumococcal Conjugate-13 07/14/2014  . Pneumococcal Polysaccharide-23 06/25/2006, 08/15/2010  . Td 03/27/2004, 02/20/2012  . Tdap 02/20/2012  . Zoster 11/01/2007    Past Medical History:  Diagnosis Date  . Anemia   . Arthritis    knee  . Diverticulosis   . Hx of colonic polyps 10/24/2014  . Hyperlipidemia   . Hypertension   . Osteopenia   . Overactive bladder    urge incontinence  . PSVT (paroxysmal supraventricular tachycardia) ([place])   . Pulmonary emboli ([place])   . Pulmonary hypertension   . Right ventricular failure ([place])   . Sleep apnea    CPAP  . Spinal stenosis     Tobacco History: History  Smoking Status  . Never Smoker  Smokeless Tobacco  . Never Used   Counseling given: Not Answered   Outpatient Encounter Prescriptions as of 11/11/2016  Medication Sig  . benzonatate (TESSALON) 200 MG capsule TAKE ONE CAPSULE BY MOUTH TWICE DAILY ASNEEDED  FOR COUGH (Patient taking differently: TAKE ONE CAPSULE BY MOUTH TWICE DAILY FOR COUGH)  . Calcium Carb-Cholecalciferol (CALCIUM 600/VITAMIN D3) 600-800 MG-UNIT TABS Take 1 tablet by mouth 2 (two) times daily.  . cephALEXin (KEFLEX) 500 MG capsule Take 500 mg by mouth 3 (three) times daily.  . Cholecalciferol (VITAMIN D-1000 MAX ST) 1000 UNITS tablet Take 1,000 Units by mouth daily.  . hydrochlorothiazide (HYDRODIURIL) 25 MG tablet TAKE 1 TABLET DAILY  . metoprolol tartrate (LOPRESSOR) 25 MG tablet Take 0.5 tablets (12.5 mg total) by mouth 2 (two) times daily.  . naproxen sodium (ANAPROX) 220 MG tablet Take  220 mg by mouth daily.   . OXYGEN Inhale 2 L into the lungs at bedtime.  Vladimir Faster Glycol-Propyl Glycol (SYSTANE) 0.4-0.3 % SOLN Apply 1 drop to eye 2 (two) times daily.  . polyethylene glycol (MIRALAX / GLYCOLAX) packet Take 17 g by mouth daily as needed (CONSTIPATOIN).   Marland Kitchen rifampin (RIFADIN) 300 MG capsule Take 300 mg by mouth 2 (two) times daily.  . [DISCONTINUED] doxycycline (VIBRAMYCIN) 100 MG capsule Take 1 capsule (100 mg total) by mouth 2 (two) times daily. One po bid x 7 days   No facility-administered encounter medications on file as of 11/11/2016.      Review of Systems  Constitutional:   No  weight loss, night sweats,  Fevers, chills,  +fatigue, or  lassitude.  HEENT:   No headaches,  Difficulty swallowing,  Tooth/dental problems, or  Sore throat,                No sneezing, itching, ear ache, nasal congestion, post nasal drip,   CV:  No chest pain,  Orthopnea, PND,  , anasarca, dizziness, palpitations, syncope.   GI  No heartburn, indigestion, abdominal pain, nausea, vomiting, diarrhea, change in bowel habits, loss of appetite, bloody stools.   Resp:   No excess mucus, no productive cough,  No non-productive cough,  No coughing up of blood.  No change in color of mucus.  No wheezing.  No chest wall deformity  Skin: no rash or lesions.  GU: no dysuria, change in color of urine, no urgency or frequency.  No flank pain, no hematuria   MS:  No joint pain or swelling.  No decreased range of motion.  No back pain.    Physical Exam  BP 122/70   Pulse 72   Ht 5\' 6"  (1.676 m)   Wt 207 lb 3.2 oz (94 kg)   SpO2 95%   BMI 33.44 kg/m   GEN: A/Ox3; pleasant , NAD, obese    HEENT:  [place]/AT,  EACs-clear, TMs-wnl, NOSE-clear, THROAT-clear, no lesions, no postnasal drip or exudate noted.   NECK:  Supple w/ fair ROM; no JVD; normal carotid impulses w/o bruits; no thyromegaly or nodules palpated; no lymphadenopathy.    RESP  Clear  P & A; w/o, wheezes/ rales/ or rhonchi. no  accessory muscle use, no dullness to percussion  CARD:  RRR, no m/r/g, tr  peripheral edema, pulses intact, no cyanosis or clubbing.  GI:   Soft & nt; nml bowel sounds; no organomegaly or masses detected.   Musco: Warm bil, no deformities or joint swelling noted.   Neuro: alert, no focal deficits noted.    Skin: Warm, no lesions or rashes  Psych:  No change in mood or affect. No depression or anxiety.  No memory loss.  Lab Results:  Imaging: No results found.   Assessment & Plan:   Chronic respiratory failure (  [place]) ONO on 2l/m was okay in Dec 2017 .  Cont on 2l/m At bedtime  .    Chronic cough Stable  Delsym prn   Pulmonary hypertension Stable , cont on O2 At bedtime   Unable to tolerate CPAP   OSA (obstructive sleep apnea) Unable to tolerate CPAP  Cont on O2 At bedtime       Rexene Edison, NP 11/11/2016

## 2016-11-11 NOTE — Patient Instructions (Addendum)
Continue on oxygen at 2L At bedtime   Delsym As needed  Cough .  Follow up with Dr. Elsworth Soho  In 6 months and As needed

## 2016-11-11 NOTE — Assessment & Plan Note (Signed)
Stable  Delsym prn

## 2016-11-11 NOTE — Assessment & Plan Note (Signed)
Stable , cont on O2 At bedtime   Unable to tolerate CPAP

## 2016-11-11 NOTE — Assessment & Plan Note (Signed)
ONO on 2l/m was okay in Dec 2017 .  Cont on 2l/m At bedtime  .

## 2016-11-11 NOTE — Assessment & Plan Note (Signed)
Unable to tolerate CPAP  Cont on O2 At bedtime

## 2016-11-16 NOTE — Progress Notes (Signed)
Reviewed & agree with plan  

## 2016-11-19 ENCOUNTER — Encounter: Payer: Medicare Other | Admitting: Internal Medicine

## 2016-11-19 DIAGNOSIS — L02416 Cutaneous abscess of left lower limb: Secondary | ICD-10-CM | POA: Diagnosis not present

## 2016-11-24 ENCOUNTER — Other Ambulatory Visit: Payer: Medicare Other

## 2016-11-24 ENCOUNTER — Ambulatory Visit: Payer: Medicare Other

## 2016-11-26 ENCOUNTER — Encounter: Payer: Medicare Other | Admitting: Internal Medicine

## 2016-11-26 DIAGNOSIS — L02416 Cutaneous abscess of left lower limb: Secondary | ICD-10-CM | POA: Diagnosis not present

## 2016-11-26 NOTE — Progress Notes (Signed)
[name], [name] ([id number]) Visit Report for 11/19/2016 Chief Complaint Document Details [name], [name]. Date of Service: 11/19/2016 12:45 PM Medical Record Patient Account Number: [id number] Number: Treating RN: Baruch Gouty, RN, BSN, Rita 1934/09/11 [phone] y.o. Other Clinician: Date of Birth/Sex: Female) Treating Salbador Fiveash Primary Care Provider: Loura Pardon Provider/Extender: G Referring Provider: Loura Pardon Weeks in Treatment: 3 Information Obtained from: Patient Chief Complaint 10/28/16; patient is here for review of a draining area in her left popliteal fossa that is been present for 8 days Electronic Signature(s) Signed: 11/19/2016 6:11:14 PM By: Linton Ham MD Entered By: Linton Ham on 11/19/2016 14:07:37 Hamme, Blima Singer ([id number]) -------------------------------------------------------------------------------- HPI Details [name]. Date of Service: 11/19/2016 12:45 PM Medical Record Patient Account Number: [id number] Number: Treating RN: Baruch Gouty, RN, BSN, Rita 07-02-1934 [phone] y.o. Other Clinician: Date of Birth/Sex: Female) Treating Emie Sommerfeld Primary Care Provider: Loura Pardon Provider/Extender: G Referring Provider: Loura Pardon Weeks in Treatment: 3 History of Present Illness HPI Description: 10/28/16; this is an 83 year old woman who arrives with a complicated medical issue. 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 Initially in 2000 and apparently most recently in 2014. 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 Indeed she was hospitalized on 02/13/16 and discharged on 02/19/16. She was taken to the OR and it was found that the popliteal abscess was communicating with her joint space. Signed to feel fluid grew MSSA. Patient was treated with IandD of the abscess. She completed IV cefazolin on 03/28/16 and since then has been on Keflex and rifampin for  chronic suppression. Her infectious disease doctor is Dr.Shrestha at [facility]. The patient was seen in our [place] sister clinic through much of October and November 2017. At that point she had a draining sinus that eventually closed over. She noted some pain behind her knee surrounding Christmas day and was seen in our emergency room on 10/23/16. During this ER visit she underwent tendon incision and drainage of a left Baker's cyst and clear colored synovial fluid was obtained. Doxycycline was added for a week to her Keflex and rifampin. A CT scan was ordered but I don't think was ever done due to artifacts from hardware. Her wound culture was negative. She has been using iodoform packing. She states her knee is painful when she stands on it for a period of time. She is not systemically unwell. She has a history of scleroderma or without is been under control for some period of time. 11/04/16 the culture that I did of this area last week showed coag-negative staph and a few enterococcus faecium. Enterococcus is ampicillin resistant. The coag negative staph is only sensitive to vancomycin and tetracycline. Previous joint infection was methicillin sensitive staph aureus. It seems I misunderstood what she said and that her infectious disease clinic appointment is this Friday and the orthopedic appointment was last Friday. I faxed the culture results to the infectious disease clinic but I've given him a copy of it today. Per the patient and her family they orthopedic surgeon did not want to do anything further to this joint. The patient is not systemically unwell in particular no fever or chills and the pain she has is really minimal 11/18/16; this is a patient who has an infected left knee prosthetic joint. She went to see infectious disease at [facility] who apparently did did not recommend any further antibiotic therapy. She continues to feel the same episodic pain but  no systemic symptoms of  particular no fever. The depth of the wound in the popliteal fossa of her left knee is 3.5 versus 5 cm last week Electronic Signature(s) Signed: 11/19/2016 6:11:14 PM By: Linton Ham MD Entered By: Linton Ham on 11/19/2016 14:09:34 [name] ([id number]) -------------------------------------------------------------------------------- Physical Exam Details [name]. Date of Service: 11/19/2016 12:45 PM Medical Record Patient Account Number: [id number] Number: Treating RN: Baruch Gouty, RN, BSN, Rita Mar 17, 1934 [phone] y.o. Other Clinician: Date of Birth/Sex: Female) Treating Tarrah Furuta Primary Care Provider: Loura Pardon Provider/Extender: G Referring Provider: Loura Pardon Weeks in Treatment: 3 Constitutional Sitting or standing Blood Pressure is within target range for patient.. Pulse regular and within target range for patient.Marland Kitchen Respirations regular, non-labored and within target range.. Temperature is normal and within the target range for the patient.. Patient's appearance is neat and clean. Appears in no acute distress. Well nourished and well developed.. Lymphatic Nonpalpable in the popliteal or inguinal area. Musculoskeletal The left knee joint remains swollen and warm I think there is probably a small amount of fluid.. Notes Wound exam; the patient's wound is in the popliteal fossa on the left. I measured this myself at 3.5 cm versus 5 cm last week. This is an improvement. This is draining clear looking sinoatrial fluid Electronic Signature(s) Signed: 11/19/2016 6:11:14 PM By: Linton Ham MD Entered By: Linton Ham on 11/19/2016 14:15:59 [name] ([id number]) -------------------------------------------------------------------------------- Physician Orders Details [name]. Date of Service: 11/19/2016 12:45 PM Medical Record Patient Account Number: [id number] Number: Treating RN: Baruch Gouty, RN,  BSN, Rita [birth year] [phone] y.o. Other Clinician: Date of Birth/Sex: Female) Treating Aryeh Butterfield Primary Care Provider: Loura Pardon Provider/Extender: G Referring Provider: Loura Pardon Weeks in Treatment: 3 Verbal / Phone Orders: No Diagnosis Coding Wound Cleansing Wound #1 Left,Posterior Knee o Clean wound with Normal Saline. Primary Wound Dressing Wound #1 Left,Posterior Knee o Iodoform packing Gauze Secondary Dressing Wound #1 Left,Posterior Knee o Dry Gauze o Boardered Foam Dressing Dressing Change Frequency Wound #1 Left,Posterior Knee o Change dressing every day. Follow-up Appointments Wound #1 Left,Posterior Knee o Return Appointment in 1 week. Edema Control Wound #1 Left,Posterior Knee o Elevate legs to the level of the heart and pump ankles as often as possible Additional Orders / Instructions Wound #1 Left,Posterior Knee o Increase protein intake. Electronic Signature(s) Signed: 11/19/2016 6:11:14 PM By: Linton Ham MD Signed: 11/25/2016 4:52:27 PM By: Regan Lemming BSN, RN ORTHA, MERENESS ([id number]) Entered By: Regan Lemming on 11/19/2016 13:30:09 AWANDA, MOLITORIS ([id number]) -------------------------------------------------------------------------------- Problem List Details Patient Name: GARNETTA, DARCO. Date of Service: 11/19/2016 12:45 PM Medical Record Patient Account Number: [id number] Number: Treating RN: Baruch Gouty, RN, BSN, Rita [birth year] [phone] y.o. Other Clinician: Date of Birth/Sex: Female) Treating Zoei Amison Primary Care Provider: Loura Pardon Provider/Extender: G Referring Provider: Loura Pardon Weeks in Treatment: 3 Active Problems ICD-10 Encounter Code Description Active Date Diagnosis M71.22 Synovial cyst of popliteal space [Baker], left knee 10/28/2016 Yes B95.61 Methicillin susceptible Staphylococcus aureus infection as 10/28/2016 Yes the cause of diseases classified elsewhere L02.416 Cutaneous abscess of  left lower limb 10/28/2016 Yes Inactive Problems Resolved Problems Electronic Signature(s) Signed: 11/19/2016 6:11:14 PM By: Linton Ham MD Entered By: Linton Ham on 11/19/2016 14:06:20 [name] ([id number]) -------------------------------------------------------------------------------- Progress Note Details [name]. Date of Service: 11/19/2016 12:45 PM Medical Record Patient Account Number: [id number] Number: Treating RN: Baruch Gouty, RN, BSN, Rita Jun 15, 1934 [phone] y.o. Other Clinician: Date of Birth/Sex: Female)  Treating Linton Ham Primary Care Provider: Loura Pardon Provider/Extender: G Referring Provider: Ayesha Mohair in Treatment: 3 Subjective Chief Complaint Information obtained from Patient 10/28/16; patient is here for review of a draining area in her left popliteal fossa that is been present for 8 days History of Present Illness (HPI) 10/28/16; this is an 83 year old woman who arrives with a complicated medical issue. 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 Initially in 2000 and apparently most recently in 2014. 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 Indeed she was hospitalized on 02/13/16 and discharged on 02/19/16. She was taken to the OR and it was found that the popliteal abscess was communicating with her joint space. Signed to feel fluid grew MSSA. Patient was treated with IandD of the abscess. She completed IV cefazolin on 03/28/16 and since then has been on Keflex and rifampin for chronic suppression. Her infectious disease doctor is Dr.Shrestha at [facility]. The patient was seen in our [place] sister clinic through much of October and November 2017. At that point she had a draining sinus that eventually closed over. She noted some pain behind her knee surrounding Christmas day and was seen in our emergency room on 10/23/16. During this ER visit she underwent tendon  incision and drainage of a left Baker's cyst and clear colored synovial fluid was obtained. Doxycycline was added for a week to her Keflex and rifampin. A CT scan was ordered but I don't think was ever done due to artifacts from hardware. Her wound culture was negative. She has been using iodoform packing. She states her knee is painful when she stands on it for a period of time. She is not systemically unwell. She has a history of scleroderma or without is been under control for some period of time. 11/04/16 the culture that I did of this area last week showed coag-negative staph and a few enterococcus faecium. Enterococcus is ampicillin resistant. The coag negative staph is only sensitive to vancomycin and tetracycline. Previous joint infection was methicillin sensitive staph aureus. It seems I misunderstood what she said and that her infectious disease clinic appointment is this Friday and the orthopedic appointment was last Friday. I faxed the culture results to the infectious disease clinic but I've given him a copy of it today. Per the patient and her family they orthopedic surgeon did not want to do anything further to this joint. The patient is not systemically unwell in particular no fever or chills and the pain she has is really minimal 11/18/16; this is a patient who has an infected left knee prosthetic joint. She went to see infectious disease at [facility] who apparently did did not recommend any further antibiotic therapy. She continues to feel the same episodic pain but no systemic symptoms of particular no fever. The depth of the wound in the popliteal fossa of her left knee is 3.5 versus 5 cm last week KYMBREE, BAS. ([id number]) Objective Constitutional Sitting or standing Blood Pressure is within target range for patient.. Pulse regular and within target range for patient.Marland Kitchen Respirations regular, non-labored and within target range.. Temperature is normal and within the target range  for the patient.. Patient's appearance is neat and clean. Appears in no acute distress. Well nourished and well developed.. Vitals Time Taken: 12:52 PM, Height: 66 in, Weight: 209 lbs, BMI: 33.7, Temperature: 97.6 F, Pulse: 54 bpm, Respiratory Rate: 16 breaths/min, Blood Pressure: 117/53 mmHg. Lymphatic Nonpalpable in the popliteal or inguinal area.  Musculoskeletal The left knee joint remains swollen and warm I think there is probably a small amount of fluid.. General Notes: Wound exam; the patient's wound is in the popliteal fossa on the left. I measured this myself at 3.5 cm versus 5 cm last week. This is an improvement. This is draining clear looking sinoatrial fluid Integumentary (Hair, Skin) Wound #1 status is Open. Original cause of wound was Gradually Appeared. The wound is located on the Left,Posterior Knee. The wound measures 0.5cm length x 0.5cm width x 3.5cm depth; 0.196cm^2 area and 0.687cm^3 volume. There is no tunneling or undermining noted. There is a large amount of serous drainage noted. The wound margin is flat and intact. There is no granulation within the wound bed. There is no necrotic tissue within the wound bed. The periwound skin appearance exhibited: Scarring. The periwound skin appearance did not exhibit: Callus, Crepitus, Excoriation, Induration, Rash, Dry/Scaly, Maceration, Atrophie Blanche, Cyanosis, Ecchymosis, Hemosiderin Staining, Mottled, Pallor, Rubor, Erythema. Periwound temperature was noted as No Abnormality. Assessment Active Problems ICD-10 M71.22 - Synovial cyst of popliteal space [Baker], left knee B95.61 - Methicillin susceptible Staphylococcus aureus infection as the cause of diseases classified elsewhere L02.416 - Cutaneous abscess of left lower limb KAYLEA, GENSKE. ([id number]) Plan Wound Cleansing: Wound #1 Left,Posterior Knee: Clean wound with Normal Saline. Primary Wound Dressing: Wound #1 Left,Posterior Knee: Iodoform packing  Gauze Secondary Dressing: Wound #1 Left,Posterior Knee: Dry Gauze Boardered Foam Dressing Dressing Change Frequency: Wound #1 Left,Posterior Knee: Change dressing every day. Follow-up Appointments: Wound #1 Left,Posterior Knee: Return Appointment in 1 week. Edema Control: Wound #1 Left,Posterior Knee: Elevate legs to the level of the heart and pump ankles as often as possible Additional Orders / Instructions: Wound #1 Left,Posterior Knee: Increase protein intake. o o #1 we will continue with the iodoform packing #2 I have reviewed the infectious disease note from [facility]. They "my wound culture as showing oxacillin resistant coag negative staph but they ignored the few Enterococcus faecalis. They therefore elected to continue suppression for MSSA with Keflex and rifampin indefinitely. They did however do lab work. The patient felt that they did a repeat culture although I don't actually see this was done SADEY, DEMSKY ([id number]) Electronic Signature(s) Signed: 11/19/2016 6:11:14 PM By: Linton Ham MD Entered By: Linton Ham on 11/19/2016 14:17:54 Mikelson, Blima Singer ([id number]) -------------------------------------------------------------------------------- SuperBill Details [name]. Date of Service: 11/19/2016 Medical Record Patient Account Number: [id number] Number: Treating RN: Baruch Gouty, RN, BSN, Rita [birth year] [phone] y.o. Other Clinician: Date of Birth/Sex: Female) Treating Pressley Tadesse Primary Care Provider: Loura Pardon Provider/Extender: G Referring Provider: Loura Pardon Weeks in Treatment: 3 Diagnosis Coding ICD-10 Codes Code Description M71.22 Synovial cyst of popliteal space [Baker], left knee Methicillin susceptible Staphylococcus aureus infection as the cause of diseases B95.61 classified elsewhere L02.416 Cutaneous abscess of left lower limb Facility Procedures CPT4 Code: [id number] Description: [id number] - WOUND CARE  VISIT-LEV 2 EST PT Modifier: Quantity: 1 Physician Procedures CPT4: Description Modifier Quantity Code [id number] - WC PHYS LEVEL 2 - EST PT 1 ICD-10 Description Diagnosis L02.416 Cutaneous abscess of left lower limb B95.61 Methicillin susceptible Staphylococcus aureus infection as the cause of diseases  classified elsewhere Electronic Signature(s) Signed: 11/19/2016 6:11:14 PM By: Linton Ham MD Entered By: Linton Ham on 11/19/2016 14:18:19

## 2016-11-26 NOTE — Progress Notes (Signed)
[name], [name] ([id number]) Visit Report for 11/19/2016 Arrival Information Details [name], [name]. Date of Service: 11/19/2016 12:45 PM Medical Record Patient Account Number: [id number] Number: Treating RN: Baruch Gouty, RN, BSN, Rita September 11, 1934 [phone] y.o. Other Clinician: Date of Birth/Sex: Female) Treating ROBSON, MICHAEL Primary Care Jessilyn Catino: Loura Pardon Zyquan Crotty/Extender: G Referring Krisandra Bueno: Loura Pardon Weeks in Treatment: 3 Visit Information History Since Last Visit All ordered tests and consults were completed: No Patient Arrived: Wheel Chair Added or deleted any medications: No Arrival Time: 12:49 Any new allergies or adverse reactions: No Accompanied By: dtr Had a fall or experienced change in No Transfer Assistance: None activities of daily living that may affect Patient Identification Verified: Yes risk of falls: Secondary Verification Process Yes Signs or symptoms of abuse/neglect since last No Completed: visito Patient Has Alerts: Yes Hospitalized since last visit: No Patient Alerts: ABI 08/07/16 Has Dressing in Place as Prescribed: Yes (R) 1.3 (L) Pain Present Now: No 1.19 Electronic Signature(s) Signed: 11/25/2016 4:52:27 PM By: Regan Lemming BSN, RN Entered By: Regan Lemming on 11/19/2016 12:49:47 [name] ([id number]) -------------------------------------------------------------------------------- Clinic Level of Care Assessment Details [name]. Date of Service: 11/19/2016 12:45 PM Medical Record Patient Account Number: [id number] Number: Treating RN: Baruch Gouty, RN, BSN, Rita Jan 17, 1934 [phone] y.o. Other Clinician: Date of Birth/Sex: Female) Treating ROBSON, Helix Primary Care Rolland Steinert: Loura Pardon Beatriz Quintela/Extender: G Referring Ezelle Surprenant: Loura Pardon Weeks in Treatment: 3 Clinic Level of Care Assessment Items TOOL 4 Quantity Score []  - Use when only an EandM is performed on FOLLOW-UP visit  0 ASSESSMENTS - Nursing Assessment / Reassessment X - Reassessment of Co-morbidities (includes updates in patient status) 1 10 X - Reassessment of Adherence to Treatment Plan 1 5 ASSESSMENTS - Wound and Skin Assessment / Reassessment X - Simple Wound Assessment / Reassessment - one wound 1 5 []  - Complex Wound Assessment / Reassessment - multiple wounds 0 []  - Dermatologic / Skin Assessment (not related to wound area) 0 ASSESSMENTS - Focused Assessment []  - Circumferential Edema Measurements - multi extremities 0 []  - Nutritional Assessment / Counseling / Intervention 0 []  - Lower Extremity Assessment (monofilament, tuning fork, pulses) 0 []  - Peripheral Arterial Disease Assessment (using hand held doppler) 0 ASSESSMENTS - Ostomy and/or Continence Assessment and Care []  - Incontinence Assessment and Management 0 []  - Ostomy Care Assessment and Management (repouching, etc.) 0 PROCESS - Coordination of Care X - Simple Patient / Family Education for ongoing care 1 15 []  - Complex (extensive) Patient / Family Education for ongoing care 0 []  - Staff obtains Programmer, systems, Records, Test Results / Process Orders 0 []  - Staff telephones HHA, Nursing Homes / Clarify orders / etc 0 [name], [name] ([id number]) []  - Routine Transfer to another Facility (non-emergent condition) 0 []  - Routine Hospital Admission (non-emergent condition) 0 []  - New Admissions / Biomedical engineer / Ordering NPWT, Apligraf, etc. 0 []  - Emergency Hospital Admission (emergent condition) 0 []  - Simple Discharge Coordination 0 []  - Complex (extensive) Discharge Coordination 0 PROCESS - Special Needs []  - Pediatric / Minor Patient Management 0 []  - Isolation Patient Management 0 []  - Hearing / Language / Visual special needs 0 []  - Assessment of Community assistance (transportation, D/C planning, etc.) 0 []  - Additional assistance / Altered mentation 0 []  - Support Surface(s) Assessment (bed, cushion, seat, etc.)  0 INTERVENTIONS - Wound Cleansing / Measurement X - Simple Wound Cleansing - one wound 1 5 []  - Complex Wound Cleansing -  multiple wounds 0 []  - Wound Imaging (photographs - any number of wounds) 0 []  - Wound Tracing (instead of photographs) 0 []  - Simple Wound Measurement - one wound 0 []  - Complex Wound Measurement - multiple wounds 0 INTERVENTIONS - Wound Dressings X - Small Wound Dressing one or multiple wounds 1 10 []  - Medium Wound Dressing one or multiple wounds 0 []  - Large Wound Dressing one or multiple wounds 0 []  - Application of Medications - topical 0 []  - Application of Medications - injection 0 [name], [name] ([id number]) INTERVENTIONS - Miscellaneous []  - External ear exam 0 []  - Specimen Collection (cultures, biopsies, blood, body fluids, etc.) 0 []  - Specimen(s) / Culture(s) sent or taken to Lab for analysis 0 []  - Patient Transfer (multiple staff / Harrel Lemon Lift / Similar devices) 0 []  - Simple Staple / Suture removal (25 or less) 0 []  - Complex Staple / Suture removal (26 or more) 0 []  - Hypo / Hyperglycemic Management (close monitor of Blood Glucose) 0 []  - Ankle / Brachial Index (ABI) - do not check if billed separately 0 X - Vital Signs 1 5 Has the patient been seen at the hospital within the last three years: Yes Total Score: 55 Level Of Care: New/Established - Level 2 Electronic Signature(s) Signed: 11/25/2016 4:52:27 PM By: Regan Lemming BSN, RN Entered By: Regan Lemming on 11/19/2016 13:29:48 [name] ([id number]) -------------------------------------------------------------------------------- Encounter Discharge Information Details [name]. Date of Service: 11/19/2016 12:45 PM Medical Record Patient Account Number: [id number] Number: Treating RN: Baruch Gouty, RN, BSN, Rita [birth year] [phone] y.o. Other Clinician: Date of Birth/Sex: Female) Treating ROBSON, MICHAEL Primary Care Zaki Gertsch: Loura Pardon Jamesa Tedrick/Extender:  G Referring Sydna Brodowski: Ayesha Mohair in Treatment: 3 Encounter Discharge Information Items Discharge Pain Level: 0 Discharge Condition: Stable Ambulatory Status: Wheelchair Discharge Destination: Home Transportation: Private Auto Accompanied By: dtr Schedule Follow-up Appointment: No Medication Reconciliation completed and provided to Patient/Care No Jerrald Doverspike: Provided on Clinical Summary of Care: 11/19/2016 Form Type Recipient Paper Patient FW Electronic Signature(s) Signed: 11/19/2016 1:43:02 PM By: Ruthine Dose Entered By: Ruthine Dose on 11/19/2016 13:43:02 [name], [name] ([id number]) -------------------------------------------------------------------------------- Lower Extremity Assessment Details [name]. Date of Service: 11/19/2016 12:45 PM Medical Record Patient Account Number: [id number] Number: Treating RN: Baruch Gouty, RN, BSN, Rita 28-Mar-1934 [phone] y.o. Other Clinician: Date of Birth/Sex: Female) Treating ROBSON, MICHAEL Primary Care Torri Michalski: Loura Pardon Ashleigh Arya/Extender: G Referring Damon Baisch: Loura Pardon Weeks in Treatment: 3 Vascular Assessment Claudication: Claudication Assessment [Left:None] Pulses: Dorsalis Pedis Palpable: [Left:Yes] Posterior Tibial Extremity colors, hair growth, and conditions: Extremity Color: [Left:Normal] Hair Growth on Extremity: [Left:No] Temperature of Extremity: [Left:Warm] Electronic Signature(s) Signed: 11/25/2016 4:52:27 PM By: Regan Lemming BSN, RN Entered By: Regan Lemming on 11/19/2016 12:50:29 [name] ([id number]) -------------------------------------------------------------------------------- Multi Wound Chart Details [name]. Date of Service: 11/19/2016 12:45 PM Medical Record Patient Account Number: [id number] Number: Treating RN: Baruch Gouty, RN, BSN, Rita [birth year] [phone] y.o. Other Clinician: Date of Birth/Sex: Female) Treating ROBSON,  MICHAEL Primary Care Lynley Killilea: Loura Pardon Carlon Davidson/Extender: G Referring Malone Admire: Loura Pardon Weeks in Treatment: 3 Vital Signs Height(in): 66 Pulse(bpm): 54 Weight(lbs): 209 Blood Pressure 117/53 (mmHg): Body Mass Index(BMI): 34 Temperature(F): 97.6 Respiratory Rate 16 (breaths/min): Photos: [1:No Photos] [N/A:N/A] Wound Location: [1:Left Knee - Posterior] [N/A:N/A] Wounding Event: [1:Gradually Appeared] [N/A:N/A] Primary Etiology: [1:Cyst] [N/A:N/A] Comorbid History: [1:Cataracts, Sleep Apnea, N/A Arrhythmia, Hypertension, Scleroderma] Date Acquired: [1:10/22/2016] [N/A:N/A] Weeks of Treatment: [1:3] [N/A:N/A] Wound Status: [1:Open] [  N/A:N/A] Measurements L x W x D 0.5x0.5x3.5 [N/A:N/A] (cm) Area (cm) : [1:0.196] [N/A:N/A] Volume (cm) : [1:0.687] [N/A:N/A] % Reduction in Area: [1:28.70%] [N/A:N/A] % Reduction in Volume: -737.80% [N/A:N/A] Classification: [1:Full Thickness Without Exposed Support Structures] [N/A:N/A] Exudate Amount: [1:Large] [N/A:N/A] Exudate Type: [1:Serous] [N/A:N/A] Exudate Color: [1:amber] [N/A:N/A] Wound Margin: [1:Flat and Intact] [N/A:N/A] Granulation Amount: [1:None Present (0%)] [N/A:N/A] Necrotic Amount: [1:None Present (0%)] [N/A:N/A] Epithelialization: [1:None] [N/A:N/A] Periwound Skin Texture: Scarring: Yes [1:Excoriation: No] [N/A:N/A] Induration: No Callus: No Crepitus: No Rash: No Periwound Skin Maceration: No N/A N/A Moisture: Dry/Scaly: No Periwound Skin Color: Atrophie Blanche: No N/A N/A Cyanosis: No Ecchymosis: No Erythema: No Hemosiderin Staining: No Mottled: No Pallor: No Rubor: No Temperature: No Abnormality N/A N/A Tenderness on No N/A N/A Palpation: Wound Preparation: Ulcer Cleansing: N/A N/A Rinsed/Irrigated with Saline Topical Anesthetic Applied: None Treatment Notes Wound #1 (Left, Posterior Knee) 1. Cleansed with: Clean wound with Normal Saline 4. Dressing Applied: Iodoform packing  Gauze 5. Secondary Dressing Applied ABD Pad Dry Gauze 7. Secured with Recruitment consultant) Signed: 11/19/2016 6:11:14 PM By: Linton Ham MD Entered By: Linton Ham on 11/19/2016 14:07:21 [name] ([id number]) -------------------------------------------------------------------------------- [facility] Details [name], [name]. Date of Service: 11/19/2016 12:45 PM Medical Record Patient Account Number: [id number] Number: Treating RN: Baruch Gouty, RN, BSN, Rita Nov 13, 1933 [phone] y.o. Other Clinician: Date of Birth/Sex: Female) Treating ROBSON, MICHAEL Primary Care Marlynn Hinckley: Loura Pardon Edoardo Laforte/Extender: G Referring Shuntay Everetts: Loura Pardon Weeks in Treatment: 3 Active Inactive ` Abuse / Safety / Falls / Self Care Management Nursing Diagnoses: Impaired physical mobility Potential for falls Goals: Patient will remain injury free Date Initiated: 10/28/2016 Target Resolution Date: 02/17/2017 Goal Status: Active Interventions: Assess: immobility, friction, shearing, incontinence upon admission and as needed Notes: ` Orientation to the Wound Care Program Nursing Diagnoses: Knowledge deficit related to the wound healing center program Goals: Patient/caregiver will verbalize understanding of the [place] Date Initiated: 10/28/2016 Target Resolution Date: 02/17/2017 Goal Status: Active Interventions: Provide education on orientation to the wound center Notes: ` Soft Tissue Infection [name], [name] ([id number]) Nursing Diagnoses: Impaired tissue integrity Potential for infection: soft tissue Goals: Patient will remain free of wound infection Date Initiated: 10/28/2016 Target Resolution Date: 02/17/2017 Goal Status: Active Interventions: Assess signs and symptoms of infection every visit Treatment Activities: Culture and sensitivity : 10/28/2016 Notes: ` Wound/Skin Impairment Nursing Diagnoses: Impaired  tissue integrity Goals: Ulcer/skin breakdown will heal within 14 weeks Date Initiated: 10/28/2016 Target Resolution Date: 02/17/2017 Goal Status: Active Interventions: Assess ulceration(s) every visit Notes: Electronic Signature(s) Signed: 11/19/2016 1:48:45 PM By: Regan Lemming BSN, RN Entered By: Regan Lemming on 11/19/2016 13:48:44 [name] ([id number]) -------------------------------------------------------------------------------- Pain Assessment Details [name]. Date of Service: 11/19/2016 12:45 PM Medical Record Patient Account Number: [id number] Number: Treating RN: Baruch Gouty, RN, BSN, Rita 29-Mar-1934 [phone] y.o. Other Clinician: Date of Birth/Sex: Female) Treating ROBSON, MICHAEL Primary Care Marveline Profeta: Loura Pardon Jordana Dugue/Extender: G Referring Demetrius Mahler: Loura Pardon Weeks in Treatment: 3 Active Problems Location of Pain Severity and Description of Pain Patient Has Paino No Site Locations With Dressing Change: No Pain Management and Medication Current Pain Management: Electronic Signature(s) Signed: 11/25/2016 4:52:27 PM By: Regan Lemming BSN, RN Entered By: Regan Lemming on 11/19/2016 12:49:53 [name] ([id number]) -------------------------------------------------------------------------------- Patient/Caregiver Education Details [name]. Date of Service: 11/19/2016 12:45 PM Medical Record Patient Account Number: [id number] Number: Treating RN: Baruch Gouty, RN, BSN, Rita [birth year] [phone] y.o. Other  Clinician: Date of Birth/Gender: Female) Treating ROBSON, MICHAEL Primary Care Physician: Loura Pardon Physician/Extender: G Referring Physician: Ayesha Mohair in Treatment: 3 Education Assessment Education Provided To: Patient Education Topics Provided Welcome To The [facility]: Methods: Explain/Verbal Responses: State content correctly Wound/Skin Impairment: Methods: Explain/Verbal Responses:  State content correctly Electronic Signature(s) Signed: 11/25/2016 4:52:27 PM By: Regan Lemming BSN, RN Entered By: Regan Lemming on 11/19/2016 13:39:56 [name] ([id number]) -------------------------------------------------------------------------------- Wound Assessment Details [name]. Date of Service: 11/19/2016 12:45 PM Medical Record Patient Account Number: [id number] Number: Treating RN: Baruch Gouty, RN, BSN, Rita 09-28-1934 [phone] y.o. Other Clinician: Date of Birth/Sex: Female) Treating ROBSON, MICHAEL Primary Care Laloni Rowton: Loura Pardon Lewanda Perea/Extender: G Referring Mona Ayars: Loura Pardon Weeks in Treatment: 3 Wound Status Wound Number: 1 Primary Cyst Etiology: Wound Location: Left Knee - Posterior Wound Open Wounding Event: Gradually Appeared Status: Date Acquired: 10/22/2016 Comorbid Cataracts, Sleep Apnea, Arrhythmia, Weeks Of Treatment: 3 History: Hypertension, Scleroderma Clustered Wound: No Photos Photo Uploaded By: Gretta Cool, RN, BSN, Kim on 11/19/2016 16:21:37 Wound Measurements Length: (cm) 0.5 % Reduction in Ar Width: (cm) 0.5 % Reduction in Vo Depth: (cm) 3.5 Epithelialization Area: (cm) 0.196 Tunneling: Volume: (cm) 0.687 Undermining: ea: 28.7% lume: -737.8% : None No No Wound Description Full Thickness Without Exposed Foul Odor After Classification: Support Structures Slough/Fibrino Wound Margin: Flat and Intact Exudate Large Amount: Exudate Type: Serous Exudate Color: amber Cleansing: No No Wound Bed Granulation Amount: None Present (0%) [name], [name] ([id number]) Necrotic Amount: None Present (0%) Periwound Skin Texture Texture Color No Abnormalities Noted: No No Abnormalities Noted: No Callus: No Atrophie Blanche: No Crepitus: No Cyanosis: No Excoriation: No Ecchymosis: No Induration: No Erythema: No Rash: No Hemosiderin Staining: No Scarring: Yes Mottled: No Pallor: No Moisture Rubor:  No No Abnormalities Noted: No Dry / Scaly: No Temperature / Pain Maceration: No Temperature: No Abnormality Wound Preparation Ulcer Cleansing: Rinsed/Irrigated with Saline Topical Anesthetic Applied: None Treatment Notes Wound #1 (Left, Posterior Knee) 1. Cleansed with: Clean wound with Normal Saline 4. Dressing Applied: Iodoform packing Gauze 5. Secondary Dressing Applied ABD Pad Dry Gauze 7. Secured with Recruitment consultant) Signed: 11/25/2016 4:52:27 PM By: Regan Lemming BSN, RN Entered By: Regan Lemming on 11/19/2016 12:59:37 [name] ([id number]) -------------------------------------------------------------------------------- Vitals Details [name]. Date of Service: 11/19/2016 12:45 PM Medical Record Patient Account Number: [id number] Number: Treating RN: Baruch Gouty, RN, BSN, Rita Apr 18, 1934 [phone] y.o. Other Clinician: Date of Birth/Sex: Female) Treating ROBSON, MICHAEL Primary Care Tamar Lipscomb: Loura Pardon Meshawn Oconnor/Extender: G Referring Erez Mccallum: Loura Pardon Weeks in Treatment: 3 Vital Signs Time Taken: 12:52 Temperature (F): 97.6 Height (in): 66 Pulse (bpm): 54 Weight (lbs): 209 Respiratory Rate (breaths/min): 16 Body Mass Index (BMI): 33.7 Blood Pressure (mmHg): 117/53 Reference Range: 80 - 120 mg / dl Electronic Signature(s) Signed: 11/25/2016 4:52:27 PM By: Regan Lemming BSN, RN Entered By: Regan Lemming on 11/19/2016 12:53:08

## 2016-11-26 NOTE — Progress Notes (Addendum)
AFRAH, RYKER ([id number]) Visit Report for 11/26/2016 Arrival Information Details [name], [name]. Date of Service: 11/26/2016 12:45 PM Medical Record Patient Account Number: [id number] Number: Treating RN: Baruch Gouty, RN, BSN, Rita 07-Jul-1934 [phone] y.o. Other Clinician: Date of Birth/Sex: Female) Treating ROBSON, MICHAEL Primary Care Aran Menning: Loura Pardon Renesme Kerrigan/Extender: G Referring Lizandro Spellman: Loura Pardon Weeks in Treatment: 4 Visit Information History Since Last Visit All ordered tests and consults were completed: No Patient Arrived: Wheel Chair Added or deleted any medications: No Arrival Time: 12:50 Any new allergies or adverse reactions: No Accompanied By: dtr Had a fall or experienced change in No Transfer Assistance: None activities of daily living that may affect Patient Identification Verified: Yes risk of falls: Secondary Verification Process Yes Signs or symptoms of abuse/neglect since last No Completed: visito Patient Has Alerts: Yes Hospitalized since last visit: No Patient Alerts: ABI 08/07/16 Has Dressing in Place as Prescribed: Yes (R) 1.3 (L) Pain Present Now: No 1.19 Electronic Signature(s) Signed: 11/26/2016 4:42:38 PM By: Regan Lemming BSN, RN Entered By: Regan Lemming on 11/26/2016 12:54:18 [name] ([id number]) -------------------------------------------------------------------------------- Clinic Level of Care Assessment Details [name]. Date of Service: 11/26/2016 12:45 PM Medical Record Patient Account Number: [id number] Number: Treating RN: Baruch Gouty, RN, BSN, Rita 07/17/1934 [phone] y.o. Other Clinician: Date of Birth/Sex: Female) Treating ROBSON, [place] Primary Care Ronika Kelson: Loura Pardon Francely Craw/Extender: G Referring Alianny Toelle: Loura Pardon Weeks in Treatment: 4 Clinic Level of Care Assessment Items TOOL 4 Quantity Score []  - Use when only an EandM is performed on FOLLOW-UP visit  0 ASSESSMENTS - Nursing Assessment / Reassessment X - Reassessment of Co-morbidities (includes updates in patient status) 1 10 X - Reassessment of Adherence to Treatment Plan 1 5 ASSESSMENTS - Wound and Skin Assessment / Reassessment X - Simple Wound Assessment / Reassessment - one wound 1 5 []  - Complex Wound Assessment / Reassessment - multiple wounds 0 []  - Dermatologic / Skin Assessment (not related to wound area) 0 ASSESSMENTS - Focused Assessment []  - Circumferential Edema Measurements - multi extremities 0 []  - Nutritional Assessment / Counseling / Intervention 0 X - Lower Extremity Assessment (monofilament, tuning fork, pulses) 1 5 []  - Peripheral Arterial Disease Assessment (using hand held doppler) 0 ASSESSMENTS - Ostomy and/or Continence Assessment and Care []  - Incontinence Assessment and Management 0 []  - Ostomy Care Assessment and Management (repouching, etc.) 0 PROCESS - Coordination of Care X - Simple Patient / Family Education for ongoing care 1 15 []  - Complex (extensive) Patient / Family Education for ongoing care 0 []  - Staff obtains Programmer, systems, Records, Test Results / Process Orders 0 []  - Staff telephones HHA, Nursing Homes / Clarify orders / etc 0 [name], [name]. ([id number]) []  - Routine Transfer to another Facility (non-emergent condition) 0 []  - Routine Hospital Admission (non-emergent condition) 0 []  - New Admissions / Biomedical engineer / Ordering NPWT, Apligraf, etc. 0 []  - Emergency Hospital Admission (emergent condition) 0 []  - Simple Discharge Coordination 0 []  - Complex (extensive) Discharge Coordination 0 PROCESS - Special Needs []  - Pediatric / Minor Patient Management 0 []  - Isolation Patient Management 0 []  - Hearing / Language / Visual special needs 0 []  - Assessment of Community assistance (transportation, D/C planning, etc.) 0 []  - Additional assistance / Altered mentation 0 []  - Support Surface(s) Assessment (bed, cushion, seat, etc.)  0 INTERVENTIONS - Wound Cleansing / Measurement X - Simple Wound Cleansing - one wound 1 5 []  - Complex Wound  Cleansing - multiple wounds 0 X - Wound Imaging (photographs - any number of wounds) 1 5 []  - Wound Tracing (instead of photographs) 0 X - Simple Wound Measurement - one wound 1 5 []  - Complex Wound Measurement - multiple wounds 0 INTERVENTIONS - Wound Dressings X - Small Wound Dressing one or multiple wounds 1 10 []  - Medium Wound Dressing one or multiple wounds 0 []  - Large Wound Dressing one or multiple wounds 0 []  - Application of Medications - topical 0 []  - Application of Medications - injection 0 Macdowell, Sharyn Jerilynn Mages ([id number]) INTERVENTIONS - Miscellaneous []  - External ear exam 0 []  - Specimen Collection (cultures, biopsies, blood, body fluids, etc.) 0 []  - Specimen(s) / Culture(s) sent or taken to Lab for analysis 0 []  - Patient Transfer (multiple staff / Harrel Lemon Lift / Similar devices) 0 []  - Simple Staple / Suture removal (25 or less) 0 []  - Complex Staple / Suture removal (26 or more) 0 []  - Hypo / Hyperglycemic Management (close monitor of Blood Glucose) 0 []  - Ankle / Brachial Index (ABI) - do not check if billed separately 0 X - Vital Signs 1 5 Has the patient been seen at the hospital within the last three years: Yes Total Score: 70 Level Of Care: New/Established - Level 2 Electronic Signature(s) Signed: 11/26/2016 4:42:38 PM By: Regan Lemming BSN, RN Entered By: Regan Lemming on 11/26/2016 13:44:43 [name] ([id number]) -------------------------------------------------------------------------------- Encounter Discharge Information Details [name]. Date of Service: 11/26/2016 12:45 PM Medical Record Patient Account Number: [id number] Number: Treating RN: Baruch Gouty, RN, BSN, Rita 06-18-1934 [phone] y.o. Other Clinician: Date of Birth/Sex: Female) Treating ROBSON, MICHAEL Primary Care Schuyler Behan: Loura Pardon [place]/Extender:  G Referring Verda Mehta: Ayesha Mohair in Treatment: 4 Encounter Discharge Information Items Discharge Pain Level: 0 Discharge Condition: Stable Ambulatory Status: Wheelchair Discharge Destination: Home Transportation: Private Auto Accompanied By: dtr Schedule Follow-up Appointment: No Medication Reconciliation completed and provided to Patient/Care No Brittanni Cariker: Provided on Clinical Summary of Care: 11/26/2016 Form Type Recipient Paper Patient FW Electronic Signature(s) Signed: 11/26/2016 4:14:21 PM By: Regan Lemming BSN, RN Previous Signature: 11/26/2016 1:45:54 PM Version By: Ruthine Dose Entered By: Regan Lemming on 11/26/2016 16:14:21 [name], [name] ([id number]) -------------------------------------------------------------------------------- Lower Extremity Assessment Details [name]. Date of Service: 11/26/2016 12:45 PM Medical Record Patient Account Number: [id number] Number: Treating RN: Baruch Gouty, RN, BSN, Rita [birth year] [phone] y.o. Other Clinician: Date of Birth/Sex: Female) Treating ROBSON, MICHAEL Primary Care Collier Monica: Loura Pardon Caytlyn Evers/Extender: G Referring Kashawn Dirr: Loura Pardon Weeks in Treatment: 4 Vascular Assessment Claudication: Claudication Assessment [Left:None] Pulses: Dorsalis Pedis Palpable: [Left:Yes] Posterior Tibial Extremity colors, hair growth, and conditions: Extremity Color: [Left:Mottled] Hair Growth on Extremity: [Left:No] Temperature of Extremity: [Left:Warm] Capillary Refill: [Left:< 3 seconds] Electronic Signature(s) Signed: 11/26/2016 12:48:58 PM By: Regan Lemming BSN, RN Entered By: Regan Lemming on 11/26/2016 12:48:58 [name] ([id number]) -------------------------------------------------------------------------------- Multi Wound Chart Details [name]. Date of Service: 11/26/2016 12:45 PM Medical Record Patient Account Number: [id number] Number: Treating RN: Baruch Gouty,  RN, BSN, Rita 03/04/1934 [phone] y.o. Other Clinician: Date of Birth/Sex: Female) Treating ROBSON, MICHAEL Primary Care Marston Mccadden: Loura Pardon Oda Lansdowne/Extender: G Referring Reniah Cottingham: Loura Pardon Weeks in Treatment: 4 Vital Signs Height(in): 66 Pulse(bpm): 51 Weight(lbs): 209 Blood Pressure 102/78 (mmHg): Body Mass Index(BMI): 34 Temperature(F): 97.5 Respiratory Rate 16 (breaths/min): Photos: [1:No Photos] [N/A:N/A] Wound Location: [1:Left Knee - Posterior] [N/A:N/A] Wounding Event: [1:Gradually Appeared] [N/A:N/A] Primary Etiology: [1:Cyst] [N/A:N/A] Comorbid  History: [1:Cataracts, Sleep Apnea, N/A Arrhythmia, Hypertension, Scleroderma] Date Acquired: [1:10/22/2016] [N/A:N/A] Weeks of Treatment: [1:4] [N/A:N/A] Wound Status: [1:Open] [N/A:N/A] Measurements L x W x D 0.4x0.4x4.5 [N/A:N/A] (cm) Area (cm) : [1:0.126] [N/A:N/A] Volume (cm) : [1:0.565] [N/A:N/A] % Reduction in Area: [1:54.20%] [N/A:N/A] % Reduction in Volume: -589.00% [N/A:N/A] Classification: [1:Full Thickness Without Exposed Support Structures] [N/A:N/A] Exudate Amount: [1:Large] [N/A:N/A] Exudate Type: [1:Serous] [N/A:N/A] Exudate Color: [1:amber] [N/A:N/A] Wound Margin: [1:Flat and Intact] [N/A:N/A] Granulation Amount: [1:None Present (0%)] [N/A:N/A] Necrotic Amount: [1:None Present (0%)] [N/A:N/A] Epithelialization: [1:None] [N/A:N/A] Periwound Skin Texture: Scarring: Yes [1:Excoriation: No] [N/A:N/A] Induration: No Callus: No Crepitus: No Rash: No Periwound Skin Maceration: No N/A N/A Moisture: Dry/Scaly: No Periwound Skin Color: Atrophie Blanche: No N/A N/A Cyanosis: No Ecchymosis: No Erythema: No Hemosiderin Staining: No Mottled: No Pallor: No Rubor: No Temperature: No Abnormality N/A N/A Tenderness on No N/A N/A Palpation: Wound Preparation: Ulcer Cleansing: N/A N/A Rinsed/Irrigated with Saline Topical Anesthetic Applied: None Treatment Notes Electronic Signature(s) Signed:  11/26/2016 5:08:44 PM By: Linton Ham MD Entered By: Linton Ham on 11/26/2016 14:24:24 [name] ([id number]) -------------------------------------------------------------------------------- [place] Details [name]. Date of Service: 11/26/2016 12:45 PM Medical Record Patient Account Number: [id number] Number: Treating RN: Baruch Gouty, RN, BSN, Rita 04/13/1934 [phone] y.o. Other Clinician: Date of Birth/Sex: Female) Treating ROBSON, MICHAEL Primary Care Neal Trulson: Loura Pardon Lundy Cozart/Extender: G Referring Jaan Fischel: Loura Pardon Weeks in Treatment: 4 Active Inactive ` Abuse / Safety / Falls / Self Care Management Nursing Diagnoses: Impaired physical mobility Potential for falls Goals: Patient will remain injury free Date Initiated: 10/28/2016 Target Resolution Date: 02/17/2017 Goal Status: Active Interventions: Assess: immobility, friction, shearing, incontinence upon admission and as needed Notes: ` Orientation to the Wound Care Program Nursing Diagnoses: Knowledge deficit related to the wound healing center program Goals: Patient/caregiver will verbalize understanding of the [facility] Date Initiated: 10/28/2016 Target Resolution Date: 02/17/2017 Goal Status: Active Interventions: Provide education on orientation to the wound center Notes: ` Soft Tissue Infection [name], [name] ([id number]) Nursing Diagnoses: Impaired tissue integrity Potential for infection: soft tissue Goals: Patient will remain free of wound infection Date Initiated: 10/28/2016 Target Resolution Date: 02/17/2017 Goal Status: Active Interventions: Assess signs and symptoms of infection every visit Treatment Activities: Culture and sensitivity : 10/28/2016 Notes: ` Wound/Skin Impairment Nursing Diagnoses: Impaired tissue integrity Goals: Ulcer/skin breakdown will heal within 14 weeks Date Initiated: 10/28/2016 Target  Resolution Date: 02/17/2017 Goal Status: Active Interventions: Assess ulceration(s) every visit Notes: Electronic Signature(s) Signed: 11/26/2016 4:42:38 PM By: Regan Lemming BSN, RN Entered By: Regan Lemming on 11/26/2016 13:28:26 [name] ([id number]) -------------------------------------------------------------------------------- Pain Assessment Details [name]. Date of Service: 11/26/2016 12:45 PM Medical Record Patient Account Number: [id number] Number: Treating RN: Baruch Gouty, RN, BSN, Rita [birth year] [phone] y.o. Other Clinician: Date of Birth/Sex: Female) Treating ROBSON, MICHAEL Primary Care Delmi Fulfer: Loura Pardon Jayden Rudge/Extender: G Referring Stormy Sabol: Loura Pardon Weeks in Treatment: 4 Active Problems Location of Pain Severity and Description of Pain Patient Has Paino No Site Locations With Dressing Change: No Pain Management and Medication Current Pain Management: Electronic Signature(s) Signed: 11/26/2016 4:42:38 PM By: Regan Lemming BSN, RN Entered By: Regan Lemming on 11/26/2016 12:54:39 [name] ([id number]) -------------------------------------------------------------------------------- Patient/Caregiver Education Details [name]. Date of Service: 11/26/2016 12:45 PM Medical Record Patient Account Number: [id number] Number: Treating RN: Baruch Gouty, RN, BSN, Rita 08-18-1934 [phone] y.o. Other Clinician: Date of Birth/Gender: Female) Treating ROBSON, [place] Primary Care Physician:  [name], MARNE Physician/Extender: G Referring Physician: Ayesha Mohair in Treatment: 4 Education Assessment Education Provided To: Patient Education Topics Provided Welcome To [facility]: Methods: Explain/Verbal Responses: State content correctly Wound/Skin Impairment: Methods: Explain/Verbal Responses: State content correctly Electronic Signature(s) Signed: 11/26/2016 4:42:38 PM By: Regan Lemming BSN,  RN Entered By: Regan Lemming on 11/26/2016 16:14:36 [name] ([id number]) -------------------------------------------------------------------------------- Wound Assessment Details [name]. Date of Service: 11/26/2016 12:45 PM Medical Record Patient Account Number: [id number] Number: Treating RN: Baruch Gouty, RN, BSN, Rita 1934/01/05 [phone] y.o. Other Clinician: Date of Birth/Sex: Female) Treating ROBSON, MICHAEL Primary Care Dezra Mandella: Loura Pardon Reynold Mantell/Extender: G Referring Belia Febo: Loura Pardon Weeks in Treatment: 4 Wound Status Wound Number: 1 Primary Cyst Etiology: Wound Location: Left Knee - Posterior Wound Open Wounding Event: Gradually Appeared Status: Date Acquired: 10/22/2016 Comorbid Cataracts, Sleep Apnea, Arrhythmia, Weeks Of Treatment: 4 History: Hypertension, Scleroderma Clustered Wound: No Photos Photo Uploaded By: Regan Lemming on 11/26/2016 16:25:56 Wound Measurements Length: (cm) 0.4 Width: (cm) 0.4 Depth: (cm) 4.5 Area: (cm) 0.126 Volume: (cm) 0.565 % Reduction in Area: 54.2% % Reduction in Volume: -589% Epithelialization: None Tunneling: No Undermining: No Wound Description Full Thickness Without Exposed Classification: Support Structures Wound Margin: Flat and Intact [name], [name] ([id number]) Foul Odor After Cleansing: No Slough/Fibrino No Exudate Large Amount: Exudate Type: Serous Exudate Color: amber Wound Bed Granulation Amount: None Present (0%) Necrotic Amount: None Present (0%) Periwound Skin Texture Texture Color No Abnormalities Noted: No No Abnormalities Noted: No Callus: No Atrophie Blanche: No Crepitus: No Cyanosis: No Excoriation: No Ecchymosis: No Induration: No Erythema: No Rash: No Hemosiderin Staining: No Scarring: Yes Mottled: No Pallor: No Moisture Rubor: No No Abnormalities Noted: No Dry / Scaly: No Temperature / Pain Maceration: No Temperature: No Abnormality Wound  Preparation Ulcer Cleansing: Rinsed/Irrigated with Saline Topical Anesthetic Applied: None Treatment Notes Wound #1 (Left, Posterior Knee) 1. Cleansed with: Clean wound with Normal Saline 4. Dressing Applied: Iodoform packing Gauze 5. Secondary Dressing Applied ABD Pad Dry Gauze 7. Secured with Recruitment consultant) Signed: 11/26/2016 4:42:38 PM By: Regan Lemming BSN, RN Entered By: Regan Lemming on 11/26/2016 13:28:18 [name] ([id number]) -------------------------------------------------------------------------------- Vitals Details [name]. Date of Service: 11/26/2016 12:45 PM Medical Record Patient Account Number: [id number] Number: Treating RN: Baruch Gouty, RN, BSN, Rita 1934-05-09 [phone] y.o. Other Clinician: Date of Birth/Sex: Female) Treating ROBSON, MICHAEL Primary Care Koty Anctil: Loura Pardon Jaylan Hinojosa/Extender: G Referring Xaviar Lunn: Loura Pardon Weeks in Treatment: 4 Vital Signs Time Taken: 12:54 Temperature (F): 97.5 Height (in): 66 Pulse (bpm): 51 Weight (lbs): 209 Respiratory Rate (breaths/min): 16 Body Mass Index (BMI): 33.7 Blood Pressure (mmHg): 102/78 Reference Range: 80 - 120 mg / dl Electronic Signature(s) Signed: 11/26/2016 4:42:38 PM By: Regan Lemming BSN, RN Entered By: Regan Lemming on 11/26/2016 12:55:01

## 2016-11-27 NOTE — Progress Notes (Signed)
[name], [name] ([id number]) Visit Report for 11/26/2016 Chief Complaint Document Details [name], [name]. Date of Service: 11/26/2016 12:45 PM Medical Record Patient Account Number: [id number] Number: Treating RN: Baruch Gouty, RN, BSN, Rita Feb 20, 1934 [phone] y.o. Other Clinician: Date of Birth/Sex: Female) Treating Clydine Parkison Primary Care Provider: Loura Pardon Provider/Extender: G Referring Provider: Loura Pardon Weeks in Treatment: 4 Information Obtained from: Patient Chief Complaint 10/28/16; patient is here for review of a draining area in her left popliteal fossa that is been present for 8 days Electronic Signature(s) Signed: 11/26/2016 5:08:44 PM By: Linton Ham MD Entered By: Linton Ham on 11/26/2016 14:24:33 [name] ([id number]) -------------------------------------------------------------------------------- HPI Details [name]. Date of Service: 11/26/2016 12:45 PM Medical Record Patient Account Number: [id number] Number: Treating RN: Baruch Gouty, RN, BSN, Rita 1934/04/12 [phone] y.o. Other Clinician: Date of Birth/Sex: Female) Treating Randie Bloodgood Primary Care Provider: Loura Pardon Provider/Extender: G Referring Provider: Loura Pardon Weeks in Treatment: 4 History of Present Illness HPI Description: 10/28/16; this is an 83 year old woman who arrives with a complicated medical issue. 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 Initially in 2000 and apparently most recently in 2014. 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 Indeed she was hospitalized on 02/13/16 and discharged on 02/19/16. She was taken to the OR and it was found that the popliteal abscess was communicating with her joint space. Signed to feel fluid grew MSSA. Patient was treated with IandD of the abscess. She completed IV cefazolin on 03/28/16 and since then has been on Keflex and rifampin for  chronic suppression. Her infectious disease doctor is Dr[facility]Shrestha at [facility]. The patient was seen in our [place] sister clinic through much of October and November 2017. At that point she had a draining sinus that eventually closed over. She noted some pain behind her knee surrounding Christmas day and was seen in our emergency room on 10/23/16. During this ER visit she underwent tendon incision and drainage of a left Baker's cyst and clear colored synovial fluid was obtained. Doxycycline was added for a week to her Keflex and rifampin. A CT scan was ordered but I don't think was ever done due to artifacts from hardware. Her wound culture was negative. She has been using iodoform packing. She states her knee is painful when she stands on it for a period of time. She is not systemically unwell. She has a history of scleroderma or without is been under control for some period of time. 11/04/16 the culture that I did of this area last week showed coag-negative staph and a few enterococcus faecium. Enterococcus is ampicillin resistant. The coag negative staph is only sensitive to vancomycin and tetracycline. Previous joint infection was methicillin sensitive staph aureus. It seems I misunderstood what she said and that her infectious disease clinic appointment is this Friday and the orthopedic appointment was last Friday. I faxed the culture results to the infectious disease clinic but I've given him a copy of it today. Per the patient and her family they orthopedic surgeon did not want to do anything further to this joint. The patient is not systemically unwell in particular no fever or chills and the pain she has is really minimal 11/18/16; this is a patient who has an infected left knee prosthetic joint. She went to see infectious disease at [facility] who apparently did did not recommend any further antibiotic therapy. She continues to feel the same episodic pain but  no systemic symptoms of  particular no fever. The depth of the wound in the popliteal fossa of her left knee is 3.5 versus 5 cm last week 11/25/16 4.5 cm of depth today. Draining exit site. We have treating this with iodoform packing however I really have no expectation of healing and to be truthful the amount of drainage that is coming out of this knee I don't think closure of this site is necessarily considered a positive outcome. Her left knee remains warm but not painful there is an effusion she has no systemic symptoms no fever no chills or and she feels well Electronic Signature(s) METRA, MOST ([id number]) Signed: 11/26/2016 5:08:44 PM By: Linton Ham MD Entered By: Linton Ham on 11/26/2016 14:25:39 [name] ([id number]) -------------------------------------------------------------------------------- Physical Exam Details [name]. Date of Service: 11/26/2016 12:45 PM Medical Record Patient Account Number: [id number] Number: Treating RN: Baruch Gouty, RN, BSN, Rita Aug 23, 1934 [phone] y.o. Other Clinician: Date of Birth/Sex: Female) Treating Vernona Peake Primary Care Provider: Loura Pardon Provider/Extender: G Referring Provider: Loura Pardon Weeks in Treatment: 4 Constitutional Sitting or standing Blood Pressure is within target range for patient.. Pulse regular and within target range for patient.Marland Kitchen Respirations regular, non-labored and within target range.. Temperature is normal and within the target range for the patient.. Patient's appearance is neat and clean. Appears in no acute distress. Well nourished and well developed.. Notes Wound exam; the patient's wound is in the left popliteal fossa a draining site. This measures 4.5 cm this week. I have no doubt that this connects with the synovial and the drainage represents infected total knee replacement hardware. Apparently at a time where this patient develops systemic symptoms from infection the plan is to  amputate her leg above the knee, she seems aware of this Electronic Signature(s) Signed: 11/26/2016 5:08:44 PM By: Linton Ham MD Entered By: Linton Ham on 11/26/2016 14:26:59 [name] ([id number]) -------------------------------------------------------------------------------- Physician Orders Details [name]. Date of Service: 11/26/2016 12:45 PM Medical Record Patient Account Number: [id number] Number: Treating RN: Baruch Gouty, RN, BSN, Rita 11/06/1933 [phone] y.o. Other Clinician: Date of Birth/Sex: Female) Treating Jeffren Dombek Primary Care Provider: Loura Pardon Provider/Extender: G Referring Provider: Loura Pardon Weeks in Treatment: 4 Verbal / Phone Orders: No Diagnosis Coding Wound Cleansing Wound #1 Left,Posterior Knee o Clean wound with Normal Saline. Primary Wound Dressing Wound #1 Left,Posterior Knee o Iodoform packing Gauze - 1/4 Secondary Dressing Wound #1 Left,Posterior Knee o ABD pad o Dry Gauze Dressing Change Frequency Wound #1 Left,Posterior Knee o Change dressing every day. Follow-up Appointments Wound #1 Left,Posterior Knee o Return Appointment in 1 month - Call us earlier if anything new comes up.. Edema Control Wound #1 Left,Posterior Knee o Elevate legs to the level of the heart and pump ankles as often as possible Additional Orders / Instructions Wound #1 Left,Posterior Knee o Increase protein intake. Electronic Signature(s) Signed: 11/26/2016 4:42:38 PM By: Regan Lemming BSN, RN Signed: 11/26/2016 5:08:44 PM By: Linton Ham MD [name] ([id number]) Entered By: Regan Lemming on 11/26/2016 13:40:48 LESSLIE, VANDYCK ([id number]) -------------------------------------------------------------------------------- Problem List Details Patient Name: CRICKETT, [place]. Date of Service: 11/26/2016 12:45 PM Medical Record Patient Account Number: [id number] Number: Treating RN: Baruch Gouty, RN,  BSN, Rita 12-May-1934 [phone] y.o. Other Clinician: Date of Birth/Sex: Female) Treating Merissa Renwick Primary Care Provider: Loura Pardon Provider/Extender: G Referring Provider: Loura Pardon Weeks in Treatment: 4 Active Problems ICD-10 Encounter Code Description Active Date Diagnosis M71.22 Synovial cyst  of popliteal space [Baker], left knee 10/28/2016 Yes B95.61 Methicillin susceptible Staphylococcus aureus infection as 10/28/2016 Yes the cause of diseases classified elsewhere L02.416 Cutaneous abscess of left lower limb 10/28/2016 Yes Inactive Problems Resolved Problems Electronic Signature(s) Signed: 11/26/2016 5:08:44 PM By: Linton Ham MD Entered By: Linton Ham on 11/26/2016 14:24:07 [name] ([id number]) -------------------------------------------------------------------------------- Progress Note Details [name]. Date of Service: 11/26/2016 12:45 PM Medical Record Patient Account Number: [id number] Number: Treating RN: Baruch Gouty, RN, BSN, Rita August 19, 1934 [phone] y.o. Other Clinician: Date of Birth/Sex: Female) Treating Oluwatobiloba Martin Primary Care Provider: Loura Pardon Provider/Extender: G Referring Provider: Loura Pardon Weeks in Treatment: 4 Subjective Chief Complaint Information obtained from Patient 10/28/16; patient is here for review of a draining area in her left popliteal fossa that is been present for 8 days History of Present Illness (HPI) 10/28/16; this is an 83 year old woman who arrives with a complicated medical issue. 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 Initially in 2000 and apparently most recently in 2014. 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 Indeed she was hospitalized on 02/13/16 and discharged on 02/19/16. She was taken to the OR and it was found that the popliteal abscess was communicating with her joint space. Signed to feel fluid grew MSSA. Patient was  treated with IandD of the abscess. She completed IV cefazolin on 03/28/16 and since then has been on Keflex and rifampin for chronic suppression. Her infectious disease doctor is Dr[facility]Shrestha at [facility]. The patient was seen in our [place] sister clinic through much of October and November 2017. At that point she had a draining sinus that eventually closed over. She noted some pain behind her knee surrounding Christmas day and was seen in our emergency room on 10/23/16. During this ER visit she underwent tendon incision and drainage of a left Baker's cyst and clear colored synovial fluid was obtained. Doxycycline was added for a week to her Keflex and rifampin. A CT scan was ordered but I don't think was ever done due to artifacts from hardware. Her wound culture was negative. She has been using iodoform packing. She states her knee is painful when she stands on it for a period of time. She is not systemically unwell. She has a history of scleroderma or without is been under control for some period of time. 11/04/16 the culture that I did of this area last week showed coag-negative staph and a few enterococcus faecium. Enterococcus is ampicillin resistant. The coag negative staph is only sensitive to vancomycin and tetracycline. Previous joint infection was methicillin sensitive staph aureus. It seems I misunderstood what she said and that her infectious disease clinic appointment is this Friday and the orthopedic appointment was last Friday. I faxed the culture results to the infectious disease clinic but I've given him a copy of it today. Per the patient and her family they orthopedic surgeon did not want to do anything further to this joint. The patient is not systemically unwell in particular no fever or chills and the pain she has is really minimal 11/18/16; this is a patient who has an infected left knee prosthetic joint. She went to see infectious disease at [facility] who apparently did did  not recommend any further antibiotic therapy. She continues to feel the same episodic pain but no systemic symptoms of particular no fever. The depth of the wound in the popliteal fossa of her left knee is 3.5 versus 5 cm last  week 11/25/16 4.5 cm of depth today. Draining exit site. We have treating this with iodoform packing however I really have no expectation of healing and to be truthful the amount of drainage that is coming out of this SHARDAYE, MINCE. ([id number]) knee I don't think closure of this site is necessarily considered a positive outcome. Her left knee remains warm but not painful there is an effusion she has no systemic symptoms no fever no chills or and she feels well Objective Constitutional Sitting or standing Blood Pressure is within target range for patient.. Pulse regular and within target range for patient.Marland Kitchen Respirations regular, non-labored and within target range.. Temperature is normal and within the target range for the patient.. Patient's appearance is neat and clean. Appears in no acute distress. Well nourished and well developed.. Vitals Time Taken: 12:54 PM, Height: 66 in, Weight: 209 lbs, BMI: 33.7, Temperature: 97.5 F, Pulse: 51 bpm, Respiratory Rate: 16 breaths/min, Blood Pressure: 102/78 mmHg. General Notes: Wound exam; the patient's wound is in the left popliteal fossa a draining site. This measures 4.5 cm this week. I have no doubt that this connects with the synovial and the drainage represents infected total knee replacement hardware. Apparently at a time where this patient develops systemic symptoms from infection the plan is to amputate her leg above the knee, she seems aware of this Integumentary (Hair, Skin) Wound #1 status is Open. Original cause of wound was Gradually Appeared. The wound is located on the Left,Posterior Knee. The wound measures 0.4cm length x 0.4cm width x 4.5cm depth; 0.126cm^2 area and 0.565cm^3 volume. There is no tunneling or  undermining noted. There is a large amount of serous drainage noted. The wound margin is flat and intact. There is no granulation within the wound bed. There is no necrotic tissue within the wound bed. The periwound skin appearance exhibited: Scarring. The periwound skin appearance did not exhibit: Callus, Crepitus, Excoriation, Induration, Rash, Dry/Scaly, Maceration, Atrophie Blanche, Cyanosis, Ecchymosis, Hemosiderin Staining, Mottled, Pallor, Rubor, Erythema. Periwound temperature was noted as No Abnormality. Assessment Active Problems ICD-10 M71.22 - Synovial cyst of popliteal space [Baker], left knee B95.61 - Methicillin susceptible Staphylococcus aureus infection as the cause of diseases classified elsewhere L02.416 - Cutaneous abscess of left lower limb SHERAE, KARNOPP. ([id number]) Plan Wound Cleansing: Wound #1 Left,Posterior Knee: Clean wound with Normal Saline. Primary Wound Dressing: Wound #1 Left,Posterior Knee: Iodoform packing Gauze - 1/4 Secondary Dressing: Wound #1 Left,Posterior Knee: ABD pad Dry Gauze Dressing Change Frequency: Wound #1 Left,Posterior Knee: Change dressing every day. Follow-up Appointments: Wound #1 Left,Posterior Knee: Return Appointment in 1 month - Call us earlier if anything new comes up.. Edema Control: Wound #1 Left,Posterior Knee: Elevate legs to the level of the heart and pump ankles as often as possible Additional Orders / Instructions: Wound #1 Left,Posterior Knee: Increase protein intake. continue with iodoform packing F/u one month Electronic Signature(s) Signed: 11/26/2016 5:08:44 PM By: Linton Ham MD Entered By: Linton Ham on 11/26/2016 14:29:02 [name] ([id number]) -------------------------------------------------------------------------------- SuperBill Details [name]. Date of Service: 11/26/2016 Medical Record Patient Account Number: [id number] Number: Treating RN:  Baruch Gouty, RN, BSN, Rita 23-May-1934 [phone] y.o. Other Clinician: Date of Birth/Sex: Female) Treating Ravenne Wayment Primary Care Provider: Loura Pardon Provider/Extender: G Referring Provider: Loura Pardon Weeks in Treatment: 4 Diagnosis Coding ICD-10 Codes Code Description M71.22 Synovial cyst of popliteal space [Baker], left knee Methicillin susceptible Staphylococcus aureus infection as the cause of diseases B95.61 classified elsewhere L02.416 Cutaneous abscess  of left lower limb Facility Procedures CPT4 Code: [id number] Description: [phone] - WOUND CARE VISIT-LEV 2 EST PT Modifier: Quantity: 1 Physician Procedures CPT4: Description Modifier Quantity Code [id number] - WC PHYS LEVEL 2 - EST PT 1 ICD-10 Description Diagnosis M71.22 Synovial cyst of popliteal space [Baker], left knee B95.61 Methicillin susceptible Staphylococcus aureus infection as the cause of  diseases classified elsewhere Electronic Signature(s) Signed: 11/26/2016 5:08:44 PM By: Linton Ham MD Entered By: Linton Ham on 11/26/2016 14:29:50

## 2016-12-01 ENCOUNTER — Encounter: Payer: Medicare Other | Admitting: Family Medicine

## 2016-12-18 ENCOUNTER — Telehealth: Payer: Self-pay | Admitting: *Deleted

## 2016-12-18 NOTE — Telephone Encounter (Signed)
PTs daughter brought in a form to renew her handicap placard. Please complete the form and call the pt when complete. [phone]. Form placed in Rx tower.

## 2016-12-18 NOTE — Telephone Encounter (Signed)
Forms in your inbox

## 2016-12-19 NOTE — Telephone Encounter (Signed)
Pt notified form ready for pick up 

## 2016-12-19 NOTE — Telephone Encounter (Signed)
Done and in IN box 

## 2016-12-24 ENCOUNTER — Ambulatory Visit: Payer: Self-pay | Admitting: Internal Medicine

## 2016-12-31 ENCOUNTER — Encounter: Payer: Medicare Other | Attending: Internal Medicine | Admitting: Internal Medicine

## 2016-12-31 ENCOUNTER — Telehealth: Payer: Self-pay | Admitting: Adult Health

## 2016-12-31 ENCOUNTER — Telehealth: Payer: Self-pay

## 2016-12-31 DIAGNOSIS — M7122 Synovial cyst of popliteal space [Baker], left knee: Secondary | ICD-10-CM | POA: Diagnosis not present

## 2016-12-31 DIAGNOSIS — I10 Essential (primary) hypertension: Secondary | ICD-10-CM | POA: Insufficient documentation

## 2016-12-31 DIAGNOSIS — B9561 Methicillin susceptible Staphylococcus aureus infection as the cause of diseases classified elsewhere: Secondary | ICD-10-CM | POA: Insufficient documentation

## 2016-12-31 DIAGNOSIS — L02416 Cutaneous abscess of left lower limb: Secondary | ICD-10-CM | POA: Insufficient documentation

## 2016-12-31 DIAGNOSIS — Z888 Allergy status to other drugs, medicaments and biological substances status: Secondary | ICD-10-CM | POA: Insufficient documentation

## 2016-12-31 MED ORDER — BENZONATATE 200 MG PO CAPS: ORAL_CAPSULE | ORAL | 3 refills | Status: DC

## 2016-12-31 NOTE — Telephone Encounter (Signed)
Yes with 3 refills.  Please contact office for sooner follow up if symptoms do not improve or worsen or seek emergency care

## 2016-12-31 NOTE — Telephone Encounter (Signed)
Rx sent to preferred pharmacy. [name] is aware and voiced her understanding. Nothing further needed.

## 2016-12-31 NOTE — Telephone Encounter (Signed)
lmtcb x1 for pt's daughter, [name]. 

## 2016-12-31 NOTE — Telephone Encounter (Signed)
Patient's daughter is returning phone call.  °

## 2016-12-31 NOTE — Telephone Encounter (Signed)
Pts daughter wants to know why our office has not responded to pharmacy requesting refill on tessalon. Advised to ck with pulmonology; they filled med last. pts daughter voiced understanding and will cb if needed.

## 2016-12-31 NOTE — Telephone Encounter (Signed)
Spoke with pt's daughter, Shauna Hugh. Pt is needing a refill on Tessalon. This was last refilled on 09/19/2016 #180.  TP - would this be okay to refill? Thanks.

## 2017-01-02 NOTE — Progress Notes (Signed)
[name], [name] ([id number]) Visit Report for 12/31/2016 Chief Complaint Document Details [name], [name]. Date of Service: 12/31/2016 10:30 AM Medical Record Patient Account Number: [id number] Number: Treating RN: Baruch Gouty, RN, BSN, Rita 1934-04-03 [phone] y.o. Other Clinician: Date of Birth/Sex: Female) Treating Aminat Shelburne Primary Care Provider: Loura Pardon Provider/Extender: G Referring Provider: Loura Pardon Weeks in Treatment: 9 Information Obtained from: Patient Chief Complaint 10/28/16; patient is here for review of a draining area in her left popliteal fossa that is been present for 8 days Electronic Signature(s) Signed: 01/01/2017 5:01:50 PM By: Linton Ham MD Entered By: Linton Ham on 12/31/2016 10:58:39 [name] ([id number]) -------------------------------------------------------------------------------- HPI Details [name]. Date of Service: 12/31/2016 10:30 AM Medical Record Patient Account Number: [id number] Number: Treating RN: Baruch Gouty, RN, BSN, Rita 1934/03/30 [phone] y.o. Other Clinician: Date of Birth/Sex: Female) Treating Bland Rudzinski Primary Care Provider: Loura Pardon Provider/Extender: G Referring Provider: Loura Pardon Weeks in Treatment: 9 History of Present Illness HPI Description: 10/28/16; this is an 83 year old woman who arrives with a complicated medical issue. 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 Initially in 2000 and apparently most recently in 2014. 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 Indeed she was hospitalized on 02/13/16 and discharged on 02/19/16. She was taken to the OR and it was found that the popliteal abscess was communicating with her joint space. Signed to feel fluid grew MSSA. Patient was treated with IandD of the abscess. She completed IV cefazolin on 03/28/16 and since then has been on Keflex and rifampin for chronic  suppression. Her infectious disease doctor is Dr.Shrestha at [facility]. The patient was seen in our [place] sister clinic through much of October and November 2017. At that point she had a draining sinus that eventually closed over. She noted some pain behind her knee surrounding Christmas day and was seen in our emergency room on 10/23/16. During this ER visit she underwent tendon incision and drainage of a left Baker's cyst and clear colored synovial fluid was obtained. Doxycycline was added for a week to her Keflex and rifampin. A CT scan was ordered but I don't think was ever done due to artifacts from hardware. Her wound culture was negative. She has been using iodoform packing. She states her knee is painful when she stands on it for a period of time. She is not systemically unwell. She has a history of scleroderma or without is been under control for some period of time. 11/04/16 the culture that I did of this area last week showed coag-negative staph and a few enterococcus faecium. Enterococcus is ampicillin resistant. The coag negative staph is only sensitive to vancomycin and tetracycline. Previous joint infection was methicillin sensitive staph aureus. It seems I misunderstood what she said and that her infectious disease clinic appointment is this Friday and the orthopedic appointment was last Friday. I faxed the culture results to the infectious disease clinic but I've given him a copy of it today. Per the patient and her family they orthopedic surgeon did not want to do anything further to this joint. The patient is not systemically unwell in particular no fever or chills and the pain she has is really minimal 11/18/16; this is a patient who has an infected left knee prosthetic joint. She went to see infectious disease at [facility] who apparently did did not recommend any further antibiotic therapy. She continues to feel the same episodic pain but  no systemic symptoms of particular no  fever. The depth of the wound in the popliteal fossa of her left knee is 3.5 versus 5 cm last week 11/25/16 4.5 cm of depth today. Draining exit site. We have treating this with iodoform packing however I really have no expectation of healing and to be truthful the amount of drainage that is coming out of this knee I don't think closure of this site is necessarily considered a positive outcome. Her left knee remains warm but not painful there is an effusion she has no systemic symptoms no fever no chills or and she feels well 12/31/16. Still is same amount of depth today using a skinny at 4.6 cm. There is less drainage. We have been using iodoform packing and I really have no expectation of healing and to be truthful closing this sinus probably is really not the best thing to consider. 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 PAMELYN, BANCROFT. ([id number]) She is not felt to be a candidate for a fourth replacement. In the meantime she does not complain of pain except when the dressing is being changed episodically. She has not been systemically unwell no fever or chills or other issues. Electronic Signature(s) Signed: 01/01/2017 5:01:50 PM By: Linton Ham MD Entered By: Linton Ham on 12/31/2016 11:00:30 [name] ([id number]) -------------------------------------------------------------------------------- Physical Exam Details Patient Name: [name], [name]. Date of Service: 12/31/2016 10:30 AM Medical Record Patient Account Number: [id number] Number: Treating RN: Baruch Gouty, RN, BSN, Rita Feb 27, 1934 [phone] y.o. Other Clinician: Date of Birth/Sex: Female) Treating Dionicia Cerritos Primary Care Provider: Loura Pardon Provider/Extender: G Referring Provider: Loura Pardon Weeks in Treatment: 9 Constitutional Sitting or standing Blood Pressure is within target range for patient.. Pulse regular and within target range for patient.Marland Kitchen Respirations regular,  non-labored and within target range.. Temperature is normal and within the target range for the patient.. Patient's appearance is neat and clean. Appears in no acute distress. Well nourished and well developed.. Eyes Conjunctivae clear. No discharge.Marland Kitchen Respiratory Respiratory effort is easy and symmetric bilaterally. Rate is normal at rest and on room air.. Cardiovascular Pedal pulses palpable and strong bilaterally.. Lymphatic Nonpalpable in the left popliteal or inguinal area. Musculoskeletal The knee joint itself is still quite warm with a mild effusion. Notes Wound exam; the patient's wound is in the left popliteal fossa and is a draining site. I think this looks the same as last time although there is certainly less we're draining/sign OVL fluid. The depth of this is 4.6 cm which is about the same as last time. There is no surrounding infection. The knee joint itself is swollen and warm with a mild effusion Electronic Signature(s) Signed: 01/01/2017 5:01:50 PM By: Linton Ham MD Entered By: Linton Ham on 12/31/2016 11:05:04 [name] ([id number]) -------------------------------------------------------------------------------- Physician Orders Details [name]. Date of Service: 12/31/2016 10:30 AM Medical Record Patient Account Number: [id number] Number: Treating RN: Baruch Gouty, RN, BSN, Rita [birth year] [phone] y.o. Other Clinician: Date of Birth/Sex: Female) Treating Sheyenne Konz Primary Care Provider: Loura Pardon Provider/Extender: G Referring Provider: Loura Pardon Weeks in Treatment: 9 Verbal / Phone Orders: No Diagnosis Coding Wound Cleansing Wound #1 Left,Posterior Knee o Clean wound with Normal Saline. Primary Wound Dressing Wound #1 Left,Posterior Knee o Iodoform packing Gauze - 1/4 Secondary Dressing Wound #1 Left,Posterior Knee o ABD pad o Dry Gauze Dressing Change Frequency Wound #1 Left,Posterior Knee o  Change dressing every day. Follow-up Appointments Wound #1 Left,Posterior  Knee o Return Appointment in: - 6 weeks Edema Control Wound #1 Left,Posterior Knee o Elevate legs to the level of the heart and pump ankles as often as possible Additional Orders / Instructions Wound #1 Left,Posterior Knee o Increase protein intake. Electronic Signature(s) Signed: 12/31/2016 5:24:45 PM By: Regan Lemming BSN, RN Signed: 01/01/2017 5:01:50 PM By: Linton Ham MD [name] ([id number]) Entered By: Regan Lemming on 12/31/2016 11:03:26 GIANNAH, ZAVADIL ([id number]) -------------------------------------------------------------------------------- Problem List Details Patient Name: YILIN, WEEDON. Date of Service: 12/31/2016 10:30 AM Medical Record Patient Account Number: [id number] Number: Treating RN: Baruch Gouty, RN, BSN, Rita 04-28-1934 [phone] y.o. Other Clinician: Date of Birth/Sex: Female) Treating Kingstyn Deruiter Primary Care Provider: Loura Pardon Provider/Extender: G Referring Provider: Loura Pardon Weeks in Treatment: 9 Active Problems ICD-10 Encounter Code Description Active Date Diagnosis M71.22 Synovial cyst of popliteal space [Baker], left knee 10/28/2016 Yes B95.61 Methicillin susceptible Staphylococcus aureus infection as 10/28/2016 Yes the cause of diseases classified elsewhere L02.416 Cutaneous abscess of left lower limb 10/28/2016 Yes Inactive Problems Resolved Problems Electronic Signature(s) Signed: 01/01/2017 5:01:50 PM By: Linton Ham MD Entered By: Linton Ham on 12/31/2016 10:58:03 [name] ([id number]) -------------------------------------------------------------------------------- Progress Note Details [name]. Date of Service: 12/31/2016 10:30 AM Medical Record Patient Account Number: [id number] Number: Treating RN: Baruch Gouty, RN, BSN, Rita 1934-06-25 [phone] y.o. Other Clinician: Date of Birth/Sex: Female) Treating Devlyn Retter,  Ledarius Leeson Primary Care Provider: Loura Pardon Provider/Extender: G Referring Provider: Loura Pardon Weeks in Treatment: 9 Subjective Chief Complaint Information obtained from Patient 10/28/16; patient is here for review of a draining area in her left popliteal fossa that is been present for 8 days History of Present Illness (HPI) 10/28/16; this is an 83 year old woman who arrives with a complicated medical issue. 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 Initially in 2000 and apparently most recently in 2014. 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 Indeed she was hospitalized on 02/13/16 and discharged on 02/19/16. She was taken to the OR and it was found that the popliteal abscess was communicating with her joint space. Signed to feel fluid grew MSSA. Patient was treated with IandD of the abscess. She completed IV cefazolin on 03/28/16 and since then has been on Keflex and rifampin for chronic suppression. Her infectious disease doctor is Dr.Shrestha at [facility]. The patient was seen in our [place] sister clinic through much of October and November 2017. At that point she had a draining sinus that eventually closed over. She noted some pain behind her knee surrounding Christmas day and was seen in our emergency room on 10/23/16. During this ER visit she underwent tendon incision and drainage of a left Baker's cyst and clear colored synovial fluid was obtained. Doxycycline was added for a week to her Keflex and rifampin. A CT scan was ordered but I don't think was ever done due to artifacts from hardware. Her wound culture was negative. She has been using iodoform packing. She states her knee is painful when she stands on it for a period of time. She is not systemically unwell. She has a history of scleroderma or without is been under control for some period of time. 11/04/16 the culture that I did of this area last week showed coag-negative  staph and a few enterococcus faecium. Enterococcus is ampicillin resistant. The coag negative staph is only sensitive to vancomycin and tetracycline. Previous joint infection was methicillin sensitive staph aureus. It seems I misunderstood  what she said and that her infectious disease clinic appointment is this Friday and the orthopedic appointment was last Friday. I faxed the culture results to the infectious disease clinic but I've given him a copy of it today. Per the patient and her family they orthopedic surgeon did not want to do anything further to this joint. The patient is not systemically unwell in particular no fever or chills and the pain she has is really minimal 11/18/16; this is a patient who has an infected left knee prosthetic joint. She went to see infectious disease at [facility] who apparently did did not recommend any further antibiotic therapy. She continues to feel the same episodic pain but no systemic symptoms of particular no fever. The depth of the wound in the popliteal fossa of her left knee is 3.5 versus 5 cm last week 11/25/16 4.5 cm of depth today. Draining exit site. We have treating this with iodoform packing however I really have no expectation of healing and to be truthful the amount of drainage that is coming out of this KRUTI, HORACEK. ([id number]) knee I don't think closure of this site is necessarily considered a positive outcome. Her left knee remains warm but not painful there is an effusion she has no systemic symptoms no fever no chills or and she feels well 12/31/16. Still is same amount of depth today using a skinny at 4.6 cm. There is less drainage. We have been using iodoform packing and I really have no expectation of healing and to be truthful closing this sinus probably is really not the best thing to consider. 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 She is not felt to be a candidate for a fourth replacement. In the meantime she  does not complain of pain except when the dressing is being changed episodically. She has not been systemically unwell no fever or chills or other issues. Objective Constitutional Sitting or standing Blood Pressure is within target range for patient.. Pulse regular and within target range for patient.Marland Kitchen Respirations regular, non-labored and within target range.. Temperature is normal and within the target range for the patient.. Patient's appearance is neat and clean. Appears in no acute distress. Well nourished and well developed.. Vitals Time Taken: 10:29 AM, Height: 66 in, Weight: 209 lbs, BMI: 33.7, Temperature: 97.5 F, Pulse: 46 bpm, Respiratory Rate: 16 breaths/min, Blood Pressure: 125/59 mmHg. Eyes Conjunctivae clear. No discharge.Marland Kitchen Respiratory Respiratory effort is easy and symmetric bilaterally. Rate is normal at rest and on room air.. Cardiovascular Pedal pulses palpable and strong bilaterally.. Lymphatic Nonpalpable in the left popliteal or inguinal area. Musculoskeletal The knee joint itself is still quite warm with a mild effusion. General Notes: Wound exam; the patient's wound is in the left popliteal fossa and is a draining site. I think this looks the same as last time although there is certainly less we're draining/sign OVL fluid. The depth of this is 4.6 cm which is about the same as last time. There is no surrounding infection. The knee joint itself is swollen and warm with a mild effusion Integumentary (Hair, Skin) Wound #1 status is Open. Original cause of wound was Gradually Appeared. The wound is located on the BRITTNAY, PIGMAN. ([id number]) Left,Posterior Knee. The wound measures 0.3cm length x 0.4cm width x 4.8cm depth; 0.094cm^2 area and 0.452cm^3 volume. There is no tunneling or undermining noted. There is a large amount of serous drainage noted. The wound margin is flat and intact. There is no granulation  within the wound bed. There is no necrotic tissue  within the wound bed. The periwound skin appearance exhibited: Scarring. The periwound skin appearance did not exhibit: Callus, Crepitus, Excoriation, Induration, Rash, Dry/Scaly, Maceration, Atrophie Blanche, Cyanosis, Ecchymosis, Hemosiderin Staining, Mottled, Pallor, Rubor, Erythema. Periwound temperature was noted as No Abnormality. The periwound has tenderness on palpation. Assessment Active Problems ICD-10 M71.22 - Synovial cyst of popliteal space [Baker], left knee B95.61 - Methicillin susceptible Staphylococcus aureus infection as the cause of diseases classified elsewhere L02.416 - Cutaneous abscess of left lower limb Plan Wound Cleansing: Wound #1 Left,Posterior Knee: Clean wound with Normal Saline. Primary Wound Dressing: Wound #1 Left,Posterior Knee: Iodoform packing Gauze - 1/4 Secondary Dressing: Wound #1 Left,Posterior Knee: ABD pad Dry Gauze Dressing Change Frequency: Wound #1 Left,Posterior Knee: Change dressing every day. Follow-up Appointments: Wound #1 Left,Posterior Knee: Return Appointment in: - 6 weeks Edema Control: Wound #1 Left,Posterior Knee: Elevate legs to the level of the heart and pump ankles as often as possible Additional Orders / Instructions: Wound #1 Left,Posterior Knee: Increase protein intake. ANDREAH, GOHEEN. ([id number]) continue iodoform packing indefinately I have no expectaton of "healing" here as long as she has an infected prosthesis. Patient is aware of this Electronic Signature(s) Signed: 01/01/2017 3:15:12 PM By: Gretta Cool RN, BSN, Kim RN, BSN Signed: 01/01/2017 5:01:50 PM By: Linton Ham MD Entered By: Gretta Cool, RN, BSN, Kim on 01/01/2017 15:15:11 [name] ([id number]) -------------------------------------------------------------------------------- [place] Details Patient Name: DAKIA, SCHIFANO. Date of Service: 12/31/2016 Medical Record Patient Account Number: [id number] Number: Treating RN: Baruch Gouty, RN, BSN,  Rita 11-Jul-1934 [phone] y.o. Other Clinician: Date of Birth/Sex: Female) Treating Tyce Delcid Primary Care Provider: Loura Pardon Provider/Extender: G Referring Provider: Loura Pardon Service Line: Outpatient Weeks in Treatment: 9 Diagnosis Coding ICD-10 Codes Code Description M71.22 Synovial cyst of popliteal space [Baker], left knee Methicillin susceptible Staphylococcus aureus infection as the cause of diseases B95.61 classified elsewhere L02.416 Cutaneous abscess of left lower limb Facility Procedures CPT4 Code: 80034917 Description: 91505 - WOUND CARE VISIT-LEV 2 EST PT Modifier: Quantity: 1 Physician Procedures CPT4: Description Modifier Quantity Code [id number] 99213 - WC PHYS LEVEL 3 - EST PT 1 ICD-10 Description Diagnosis M71.22 Synovial cyst of popliteal space [Baker], left knee B95.61 Methicillin susceptible Staphylococcus aureus infection as the cause of  diseases classified elsewhere Electronic Signature(s) Signed: 01/01/2017 5:01:50 PM By: Linton Ham MD Entered By: Linton Ham on 12/31/2016 11:06:38

## 2017-01-02 NOTE — Progress Notes (Signed)
[name], STIPP ([id number]) Visit Report for 12/31/2016 Arrival Information Details [name], [name]. Date of Service: 12/31/2016 10:30 AM Medical Record Patient Account Number: [id number] Number: Treating RN: [name] Gouty, RN, BSN, [name] 09-14-1934 [phone] y.o. Other Clinician: Date of Birth/Sex: Female) Treating [name] Primary Care [name]: [name] [name]/Extender: G Referring [name]: [name] in Treatment: 9 Visit Information History Since Last Visit All ordered tests and consults were completed: No Patient Arrived: Wheel Chair Added or deleted any medications: No Arrival Time: 10:27 Any new allergies or adverse reactions: No Accompanied By: dtr Had a fall or experienced change in No Transfer Assistance: None activities of daily living that may affect Patient Identification Verified: Yes risk of falls: Secondary Verification Process Yes Signs or symptoms of abuse/neglect since last No Completed: visito Patient Has Alerts: Yes Hospitalized since last visit: No Patient Alerts: ABI 08/07/16 Has Dressing in Place as Prescribed: Yes (R) 1.3 (L) Pain Present Now: Yes 1.19 Electronic Signature(s) Signed: 12/31/2016 5:24:45 PM By: [name] BSN, RN Entered By: [name] on 12/31/2016 10:28:17 [name] ([id number]) -------------------------------------------------------------------------------- Clinic Level of Care Assessment Details [name]. Date of Service: 12/31/2016 10:30 AM Medical Record Patient Account Number: [id number] Number: Treating RN: [name] Gouty, RN, BSN, [name] Feb 17, 1934 [phone] y.o. Other Clinician: Date of Birth/Sex: Female) Treating ROBSON, [name] Primary Care [name]: [name] [name]/Extender: G Referring [name]: [name] in Treatment: 9 Clinic Level of Care Assessment Items TOOL 4 Quantity Score []  - Use when only an EandM is performed on FOLLOW-UP visit  0 ASSESSMENTS - Nursing Assessment / Reassessment X - Reassessment of Co-morbidities (includes updates in patient status) 1 10 X - Reassessment of Adherence to Treatment Plan 1 5 ASSESSMENTS - Wound and Skin Assessment / Reassessment X - Simple Wound Assessment / Reassessment - one wound 1 5 []  - Complex Wound Assessment / Reassessment - multiple wounds 0 []  - Dermatologic / Skin Assessment (not related to wound area) 0 ASSESSMENTS - Focused Assessment []  - Circumferential Edema Measurements - multi extremities 0 []  - Nutritional Assessment / Counseling / Intervention 0 X - Lower Extremity Assessment (monofilament, tuning fork, pulses) 1 5 []  - Peripheral Arterial Disease Assessment (using hand held doppler) 0 ASSESSMENTS - Ostomy and/or Continence Assessment and Care []  - Incontinence Assessment and Management 0 []  - Ostomy Care Assessment and Management (repouching, etc.) 0 PROCESS - Coordination of Care X - Simple Patient / Family Education for ongoing care 1 15 []  - Complex (extensive) Patient / Family Education for ongoing care 0 []  - Staff obtains Programmer, systems, Records, Test Results / Process Orders 0 []  - Staff telephones HHA, Nursing Homes / Clarify orders / etc 0 [name], [name] ([id number]) []  - Routine Transfer to another Facility (non-emergent condition) 0 []  - Routine Hospital Admission (non-emergent condition) 0 []  - New Admissions / Biomedical engineer / Ordering NPWT, Apligraf, etc. 0 []  - Emergency Hospital Admission (emergent condition) 0 []  - Simple Discharge Coordination 0 []  - Complex (extensive) Discharge Coordination 0 PROCESS - Special Needs []  - Pediatric / Minor Patient Management 0 []  - Isolation Patient Management 0 []  - Hearing / Language / Visual special needs 0 []  - Assessment of Community assistance (transportation, D/C planning, etc.) 0 []  - Additional assistance / Altered mentation 0 []  - Support Surface(s) Assessment (bed, cushion, seat, etc.)  0 INTERVENTIONS - Wound Cleansing / Measurement X - Simple Wound Cleansing - one wound 1 5 []  - Complex Wound  Cleansing - multiple wounds 0 []  - Wound Imaging (photographs - any number of wounds) 0 []  - Wound Tracing (instead of photographs) 0 X - Simple Wound Measurement - one wound 1 5 []  - Complex Wound Measurement - multiple wounds 0 INTERVENTIONS - Wound Dressings X - Small Wound Dressing one or multiple wounds 1 10 []  - Medium Wound Dressing one or multiple wounds 0 []  - Large Wound Dressing one or multiple wounds 0 []  - Application of Medications - topical 0 []  - Application of Medications - injection 0 [name] ([id number]) INTERVENTIONS - Miscellaneous []  - External ear exam 0 []  - Specimen Collection (cultures, biopsies, blood, body fluids, etc.) 0 []  - Specimen(s) / Culture(s) sent or taken to Lab for analysis 0 []  - Patient Transfer (multiple staff / Harrel Lemon Lift / Similar devices) 0 []  - Simple Staple / Suture removal (25 or less) 0 []  - Complex Staple / Suture removal (26 or more) 0 []  - Hypo / Hyperglycemic Management (close monitor of Blood Glucose) 0 []  - Ankle / Brachial Index (ABI) - do not check if billed separately 0 X - Vital Signs 1 5 Has the patient been seen at the hospital within the last three years: Yes Total Score: 65 Level Of Care: New/Established - Level 2 Electronic Signature(s) Signed: 12/31/2016 5:24:45 PM By: [name] BSN, RN Entered By: [name] on 12/31/2016 11:04:28 [name] ([id number]) -------------------------------------------------------------------------------- Encounter Discharge Information Details [name]. Date of Service: 12/31/2016 10:30 AM Medical Record Patient Account Number: [id number] Number: Treating RN: [name] Gouty, RN, BSN, [name] [birth year] [phone] y.o. Other Clinician: Date of Birth/Sex: Female) Treating [name] Primary Care [name]: [name]/Extender: G Referring  [name]: [name] in Treatment: 9 Encounter Discharge Information Items Discharge Pain Level: 0 Discharge Condition: Stable Ambulatory Status: Wheelchair Discharge Destination: Home Transportation: Private Auto Accompanied By: dtr Schedule Follow-up Appointment: No Medication Reconciliation completed No and provided to Patient/Care Avish Torry: Provided on Clinical Summary of Care: 12/31/2016 Form Type Recipient Paper Patient FW Electronic Signature(s) Signed: 12/31/2016 5:24:45 PM By: [name] BSN, RN Previous Signature: 12/31/2016 11:05:51 AM Version By: Ruthine Dose Entered By: [name] on 12/31/2016 11:06:31 [name] ([id number]) -------------------------------------------------------------------------------- Lower Extremity Assessment Details [name]. Date of Service: 12/31/2016 10:30 AM Medical Record Patient Account Number: [id number] Number: Treating RN: [name] Gouty, RN, BSN, [name] March 28, 1934 [phone] y.o. Other Clinician: Date of Birth/Sex: Female) Treating [name] Primary Care Jessiah Steinhart: [name] Lara Palinkas/Extender: G Referring Reilley Valentine: [name] in Treatment: 9 Electronic Signature(s) Signed: 12/31/2016 5:24:45 PM By: [name] BSN, RN Entered By: [name] on 12/31/2016 10:35:34 [name] ([id number]) -------------------------------------------------------------------------------- Multi Wound Chart Details [name]. Date of Service: 12/31/2016 10:30 AM Medical Record Patient Account Number: [id number] Number: Treating RN: [name] Gouty, RN, BSN, [name] 22-Dec-1933 [phone] y.o. Other Clinician: Date of Birth/Sex: Female) Treating [name] Primary Care Camaria Gerald: [name] Jacqeline Broers/Extender: G Referring Kynan Peasley: [name] in Treatment: 9 Vital Signs Height(in): 66 Pulse(bpm): 46 Weight(lbs): 209 Blood Pressure 125/59 (mmHg): Body Mass Index(BMI):  34 Temperature(F): 97.5 Respiratory Rate 16 (breaths/min): Photos: [1:No Photos] [N/A:N/A] Wound Location: [1:Left Knee - Posterior] [N/A:N/A] Wounding Event: [1:Gradually Appeared] [N/A:N/A] Primary Etiology: [1:Cyst] [N/A:N/A] Comorbid History: [1:Cataracts, Sleep Apnea, N/A Arrhythmia, Hypertension, Scleroderma] Date Acquired: [1:10/22/2016] [N/A:N/A] [name] of Treatment: [1:9] [N/A:N/A] Wound Status: [1:Open] [N/A:N/A] Measurements L x W x D 0.3x0.4x4.5 [N/A:N/A] (cm) Area (cm) : [1:0.094] [N/A:N/A] Volume (cm) : [  1:0.424] [N/A:N/A] % Reduction in Area: [1:65.80%] [N/A:N/A] % Reduction in Volume: -417.10% [N/A:N/A] Classification: [1:Full Thickness Without Exposed Support Structures] [N/A:N/A] Exudate Amount: [1:Large] [N/A:N/A] Exudate Type: [1:Serous] [N/A:N/A] Exudate Color: [1:amber] [N/A:N/A] Wound Margin: [1:Flat and Intact] [N/A:N/A] Granulation Amount: [1:None Present (0%)] [N/A:N/A] Necrotic Amount: [1:None Present (0%)] [N/A:N/A] Epithelialization: [1:None] [N/A:N/A] Periwound Skin Texture: Scarring: Yes [1:Excoriation: No] [N/A:N/A] Induration: No Callus: No Crepitus: No Rash: No Periwound Skin Maceration: No N/A N/A Moisture: Dry/Scaly: No Periwound Skin Color: Atrophie Blanche: No N/A N/A Cyanosis: No Ecchymosis: No Erythema: No Hemosiderin Staining: No Mottled: No Pallor: No Rubor: No Temperature: No Abnormality N/A N/A Tenderness on Yes N/A N/A Palpation: Wound Preparation: Ulcer Cleansing: N/A N/A Rinsed/Irrigated with Saline Topical Anesthetic Applied: None Treatment Notes Electronic Signature(s) Signed: 01/01/2017 5:01:50 PM By: Linton Ham MD Entered By: Linton Ham on 12/31/2016 10:58:24 [name] ([id number]) -------------------------------------------------------------------------------- [place] Details [name]. Date of Service: 12/31/2016 10:30 AM Medical Record Patient  Account Number: [id number] Number: Treating RN: [name] Gouty, RN, BSN, [name] 02-Nov-1933 [phone] y.o. Other Clinician: Date of Birth/Sex: Female) Treating [name] Primary Care Kullen Tomasetti: [name] Christyana Corwin/Extender: G Referring Coty Student: [name] in Treatment: 9 Active Inactive ` Abuse / Safety / Falls / Self Care Management Nursing Diagnoses: Impaired physical mobility Potential for falls Goals: Patient will remain injury free Date Initiated: 10/28/2016 Target Resolution Date: 02/17/2017 Goal Status: Active Interventions: Assess: immobility, friction, shearing, incontinence upon admission and as needed Notes: ` Orientation to the Wound Care Program Nursing Diagnoses: Knowledge deficit related to the wound healing center program Goals: Patient/caregiver will verbalize understanding of the [place] Date Initiated: 10/28/2016 Target Resolution Date: 02/17/2017 Goal Status: Active Interventions: Provide education on orientation to the wound center Notes: ` Soft Tissue Infection SANAIYAH, KIRCHHOFF ([id number]) Nursing Diagnoses: Impaired tissue integrity Potential for infection: soft tissue Goals: Patient will remain free of wound infection Date Initiated: 10/28/2016 Target Resolution Date: 02/17/2017 Goal Status: Active Interventions: Assess signs and symptoms of infection every visit Treatment Activities: Culture and sensitivity : 10/28/2016 Notes: ` Wound/Skin Impairment Nursing Diagnoses: Impaired tissue integrity Goals: Ulcer/skin breakdown will heal within 14 [name] Date Initiated: 10/28/2016 Target Resolution Date: 02/17/2017 Goal Status: Active Interventions: Assess ulceration(s) every visit Notes: Electronic Signature(s) Signed: 12/31/2016 5:24:45 PM By: [name] BSN, RN Entered By: [name] on 12/31/2016 10:54:15 [name]  ([id number]) -------------------------------------------------------------------------------- Pain Assessment Details [name]. Date of Service: 12/31/2016 10:30 AM Medical Record Patient Account Number: [id number] Number: Treating RN: [name] Gouty, RN, BSN, [name] 07-02-1934 [phone] y.o. Other Clinician: Date of Birth/Sex: Female) Treating [name] Primary Care Zaviyar Rahal: [name] Alvaretta Eisenberger/Extender: G Referring Brylynn Hanssen: [name] in Treatment: 9 Active Problems Location of Pain Severity and Description of Pain Patient Has Paino Yes Site Locations Pain Location: Pain in Ulcers With Dressing Change: Yes Rate the pain. Current Pain Level: 3 Character of Pain Describe the Pain: Aching, Tender Pain Management and Medication Current Pain Management: Medication: Yes How does your wound impact your activities of daily livingo Sleep: Yes Bathing: Yes Appetite: Yes Relationship With Others: Yes Bladder Continence: Yes Emotions: Yes Bowel Continence: Yes Work: Yes Toileting: Yes Drive: Yes Dressing: Yes Hobbies: Yes Engineer, maintenance) Signed: 12/31/2016 5:24:45 PM By: [name] BSN, RN Entered By: [name] on 12/31/2016 10:28:56 [name] ([id number]) -------------------------------------------------------------------------------- Patient/Caregiver Education Details [name]. Date of Service: 12/31/2016 10:30 AM Medical Record Patient Account Number: [id number]  Number: Treating RN: [name] Gouty, RN, BSN, [name] 07/18/1934 [phone] y.o. Other Clinician: Date of Birth/Gender: Female) Treating [name] Primary Care Physician: [name] Physician/Extender: G Referring Physician: Ayesha [name] in Treatment: 9 Education Assessment Education Provided To: Patient Education Topics Provided Basic Hygiene: Methods: Explain/Verbal Responses: State content correctly Welcome To [facility]: Methods: Explain/Verbal Responses: State content correctly Wound/Skin Impairment: Methods: Explain/Verbal Responses: State content correctly Electronic Signature(s) Signed: 12/31/2016 5:24:45 PM By: [name] BSN, RN Entered By: [name] on 12/31/2016 11:06:50 [name] ([id number]) -------------------------------------------------------------------------------- Wound Assessment Details [name]. Date of Service: 12/31/2016 10:30 AM Medical Record Patient Account Number: [id number] Number: Treating RN: [name] Gouty, RN, BSN, [name] 1934-07-28 [phone] y.o. Other Clinician: Date of Birth/Sex: Female) Treating [name] Primary Care Ebonee Stober: [name] Taylen Wendland/Extender: G Referring Damyia Strider: [name] in Treatment: 9 Wound Status Wound Number: 1 Primary Cyst Etiology: Wound Location: Left, Posterior Knee Wound Open Wounding Event: Gradually Appeared Status: Date Acquired: 10/22/2016 Comorbid Cataracts, Sleep Apnea, Arrhythmia, [name] Of Treatment: 9 History: Hypertension, Scleroderma Clustered Wound: No Photos Photo Uploaded By: [name] on 12/31/2016 17:24:29 Wound Measurements Length: (cm) 0.3 Width: (cm) 0.4 Depth: (cm) 4.8 Area: (cm) 0.094 Volume: (cm) 0.452 % Reduction in Area: 65.8% % Reduction in Volume: -451.2% Epithelialization: None Tunneling: No Undermining: No Wound Description Full Thickness Without Exposed Foul Odor Aft Classification: Support Structures Slough/Fibrin Wound Margin: Flat and Intact Exudate Large Amount: Exudate Type: Serous Exudate Color: amber er Cleansing: No o No Wound Bed Granulation Amount: None Present (0%) Sivertson, Blima Singer ([id number]) Necrotic Amount: None Present (0%) Periwound Skin Texture Texture Color No Abnormalities Noted: No No Abnormalities Noted: No Callus: No Atrophie Blanche: No Crepitus: No Cyanosis: No Excoriation: No Ecchymosis:  No Induration: No Erythema: No Rash: No Hemosiderin Staining: No Scarring: Yes Mottled: No Pallor: No Moisture Rubor: No No Abnormalities Noted: No Dry / Scaly: No Temperature / Pain Maceration: No Temperature: No Abnormality Tenderness on Palpation: Yes Wound Preparation Ulcer Cleansing: Rinsed/Irrigated with Saline Topical Anesthetic Applied: None Treatment Notes Wound #1 (Left, Posterior Knee) 1. Cleansed with: Clean wound with Normal Saline 4. Dressing Applied: Iodoform packing Gauze 5. Secondary Dressing Applied ABD Pad Dry Gauze 7. Secured with Recruitment consultant) Signed: 12/31/2016 5:24:45 PM By: [name] BSN, RN Entered By: [name] on 12/31/2016 11:17:08 [name] ([id number]) -------------------------------------------------------------------------------- Vitals Details [name]. Date of Service: 12/31/2016 10:30 AM Medical Record Patient Account Number: [id number] Number: Treating RN: [name] Gouty, RN, BSN, [name] June 13, 1934 [phone] y.o. Other Clinician: Date of Birth/Sex: Female) Treating [name] Primary Care Jahshua Bonito: [name] Jaelene Garciagarcia/Extender: G Referring Deeanne Deininger: [name] in Treatment: 9 Vital Signs Time Taken: 10:29 Temperature (F): 97.5 Height (in): 66 Pulse (bpm): 46 Weight (lbs): 209 Respiratory Rate (breaths/min): 16 Body Mass Index (BMI): 33.7 Blood Pressure (mmHg): 125/59 Reference Range: 80 - 120 mg / dl Electronic Signature(s) Signed: 12/31/2016 5:24:45 PM By: [name] BSN, RN Entered By: [name] on 12/31/2016 10:35:23

## 2017-01-14 ENCOUNTER — Encounter: Payer: Medicare Other | Admitting: Internal Medicine

## 2017-01-14 DIAGNOSIS — L02416 Cutaneous abscess of left lower limb: Secondary | ICD-10-CM | POA: Diagnosis not present

## 2017-01-15 ENCOUNTER — Other Ambulatory Visit
Admission: RE | Admit: 2017-01-15 | Discharge: 2017-01-15 | Disposition: A | Discharge status: Home / Self Care | Payer: Medicare Other | Source: Ambulatory Visit | Attending: Internal Medicine | Admitting: Internal Medicine

## 2017-01-15 DIAGNOSIS — B999 Unspecified infectious disease: Secondary | ICD-10-CM | POA: Diagnosis present

## 2017-01-17 LAB — AEROBIC CULTURE W GRAM STAIN (SUPERFICIAL SPECIMEN)

## 2017-01-17 LAB — AEROBIC CULTURE  (SUPERFICIAL SPECIMEN): CULTURE: NO GROWTH

## 2017-01-17 NOTE — Progress Notes (Signed)
LESLEA, VOWLES ([id number]) Visit Report for 01/14/2017 Chief Complaint Document Details [name], [name]. Date of Service: 01/14/2017 11:00 AM Medical Record Patient Account Number: [id number] Number: Treating RN: Cornell Barman 1934-10-22 (83 y.o. Other Clinician: Date of Birth/Sex: Female) Treating ROBSON, MICHAEL Primary Care Provider: Loura Pardon Provider/Extender: G Referring Provider: Loura Pardon Weeks in Treatment: 11 Information Obtained from: Patient Chief Complaint 10/28/16; patient is here for review of a draining area in her left popliteal fossa that is been present for 8 days Electronic Signature(s) Signed: 01/14/2017 4:37:19 PM By: Linton Ham MD Entered By: Linton Ham on 01/14/2017 11:25:10 [name] ([id number]) -------------------------------------------------------------------------------- HPI Details [name]. Date of Service: 01/14/2017 11:00 AM Medical Record Patient Account Number: [id number] Number: Treating RN: Cornell Barman 24-Mar-1934 (83 y.o. Other Clinician: Date of Birth/Sex: Female) Treating ROBSON, MICHAEL Primary Care Provider: Loura Pardon Provider/Extender: G Referring Provider: Loura Pardon Weeks in Treatment: 11 History of Present Illness HPI Description: 10/28/16; this is an 83 year old woman who arrives with a complicated medical issue. 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 Initially in 2000 and apparently most recently in 2014. 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 Indeed she was hospitalized on 02/13/16 and discharged on 02/19/16. She was taken to the OR and it was found that the popliteal abscess was communicating with her joint space. Signed to feel fluid grew MSSA. Patient was treated with IandD of the abscess. She completed IV cefazolin on 03/28/16 and since then has been on Keflex and rifampin for chronic suppression. Her  infectious disease doctor is Dr.Shrestha at [facility]. The patient was seen in our [place] sister clinic through much of October and November 2017. At that point she had a draining sinus that eventually closed over. She noted some pain behind her knee surrounding Christmas day and was seen in our emergency room on 10/23/16. During this ER visit she underwent tendon incision and drainage of a left Baker's cyst and clear colored synovial fluid was obtained. Doxycycline was added for a week to her Keflex and rifampin. A CT scan was ordered but I don't think was ever done due to artifacts from hardware. Her wound culture was negative. She has been using iodoform packing. She states her knee is painful when she stands on it for a period of time. She is not systemically unwell. She has a history of scleroderma or without is been under control for some period of time. 11/04/16 the culture that I did of this area last week showed coag-negative staph and a few enterococcus faecium. Enterococcus is ampicillin resistant. The coag negative staph is only sensitive to vancomycin and tetracycline. Previous joint infection was methicillin sensitive staph aureus. It seems I misunderstood what she said and that her infectious disease clinic appointment is this Friday and the orthopedic appointment was last Friday. I faxed the culture results to the infectious disease clinic but I've given him a copy of it today. Per the patient and her family they orthopedic surgeon did not want to do anything further to this joint. The patient is not systemically unwell in particular no fever or chills and the pain she has is really minimal 11/18/16; this is a patient who has an infected left knee prosthetic joint. She went to see infectious disease at [facility] who apparently did did not recommend any further antibiotic therapy. She continues to feel the same episodic pain but no systemic symptoms of  particular no fever. The depth  of the wound in the popliteal fossa of her left knee is 3.5 versus 5 cm last week 11/25/16 4.5 cm of depth today. Draining exit site. We have treating this with iodoform packing however I really have no expectation of healing and to be truthful the amount of drainage that is coming out of this knee I don't think closure of this site is necessarily considered a positive outcome. Her left knee remains warm but not painful there is an effusion she has no systemic symptoms no fever no chills or and she feels well 12/31/16. Still is same amount of depth today using a skinny at 4.6 cm. There is less drainage. We have been using iodoform packing and I really have no expectation of healing and to be truthful closing this sinus probably is really not the best thing to consider. 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 [name], [name]. ([id number]) She is not felt to be a candidate for a fourth replacement. In the meantime she does not complain of pain except when the dressing is being changed episodically. She has not been systemically unwell no fever or chills or other issues. 01/14/17; after the patient left on 3/7 her daughter tells me the next day the wound had closed over and therefore this is not intact since. Over the last 3 days the patient has noted significant pain in the popliteal fossa of the involved left knee. 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 MSSA was the cause of the original identified joint infection treated at [facility]. Engineer, maintenance) Signed: 01/14/2017 4:37:19 PM By: Linton Ham MD Entered By: Linton Ham on 01/14/2017 11:26:43 [name] ([id number]) -------------------------------------------------------------------------------- Incision and Drainage Details [name]. Date of Service: 01/14/2017 11:00 AM Medical Record Patient Account  Number: [id number] Number: Treating RN: Cornell Barman 1934-07-11 (83 y.o. Other Clinician: Date of Birth/Sex: Female) Treating ROBSON, MICHAEL Primary Care Provider: Loura Pardon Provider/Extender: G Referring Provider: Loura Pardon Weeks in Treatment: 11 Incision And Drainage Wound #1 Left, Posterior Knee Performed for: Performed By: Physician Ricard Dillon, MD Incision And Drainage Abscess Type: Location: posterior knee Pre-procedure Yes - 11:00 Verification/Time Out Taken: Drainage Of: Serous Instrument: Blade Bleeding: Minimum Hemostasis Achieved: Pressure Culture Sent: Swab Procedural Pain: 1 Post Procedural Pain: 0 Response to Procedure was tolerated well Treatment: Post Procedure Diagnosis Same as Pre-procedure Electronic Signature(s) Signed: 01/14/2017 4:37:19 PM By: Linton Ham MD Entered By: Linton Ham on 01/14/2017 11:25:02 [name] ([id number]) -------------------------------------------------------------------------------- Physical Exam Details [name]. Date of Service: 01/14/2017 11:00 AM Medical Record Patient Account Number: [id number] Number: Treating RN: Cornell Barman Aug 28, 1934 (83 y.o. Other Clinician: Date of Birth/Sex: Female) Treating ROBSON, MICHAEL Primary Care Provider: Loura Pardon Provider/Extender: G Referring Provider: Loura Pardon Weeks in Treatment: 11 Constitutional Sitting or standing Blood Pressure is within target range for patient.. Pulse regular and within target range for patient.Marland Kitchen Respirations regular, non-labored and within target range.. Temperature is normal and within the target range for the patient.. Patient appears somewhat uncomfortable but is not in overt medical distress. Eyes Conjunctivae clear. No discharge.Marland Kitchen Respiratory Respiratory effort is easy and symmetric bilaterally. Rate is normal at rest and on room air.. Bilateral breath sounds are clear and equal in  all lobes with no wheezes, rales or rhonchi.. Cardiovascular Heart rhythm and rate regular, without murmur or gallop.Marland Kitchen  Gastrointestinal (GI) No liver or spleen enlargement or tenderness.. Lymphatic None palpable in the inguinal area or cervical area.. Musculoskeletal The left knee joint itself continues to be swollen and warm. There is no doubt a small effusion. The knee is not stable. Integumentary (Hair, Skin) Patient does not have a rash. Psychiatric No evidence of depression, anxiety, or agitation. Calm, cooperative, and communicative. Appropriate interactions and affect.. Notes Wound exam; the patient arrived with the sinus tract in the popliteal fossa closed over. According to her daughter this admitting closed over for almost 2 weeks. The patient started to develop pain 3 days ago. There was erythema around the sinus tract in the popliteal fossa and ballotable fluid. Using a #15 blade I did a small incision of this to express probably 30 cc of cloudy yellow fluid at the end of this some serosanguineous fluid. I took a sample of this fluid for culture. Electronic Signature(s) Signed: 01/14/2017 4:37:19 PM By: Linton Ham MD Entered By: Linton Ham on 01/14/2017 11:31:11 [name], [name] ([id number]) [name], [name] ([id number]) -------------------------------------------------------------------------------- Physician Orders Details Patient Name: NYANNA, HEIDEMAN. Date of Service: 01/14/2017 11:00 AM Medical Record Patient Account Number: [id number] Number: Treating RN: Cornell Barman 1933-11-24 (83 y.o. Other Clinician: Date of Birth/Sex: Female) Treating ROBSON, MICHAEL Primary Care Provider: Loura Pardon Provider/Extender: G Referring Provider: Loura Pardon Weeks in Treatment: 11 Verbal / Phone Orders: No Diagnosis Coding Wound Cleansing Wound #1 Left,Posterior Knee o Clean wound with Normal Saline. Primary Wound Dressing Wound #1 Left,Posterior Knee o  Iodoform packing Gauze - 1/4 Secondary Dressing Wound #1 Left,Posterior Knee o Boardered Foam Dressing Dressing Change Frequency Wound #1 Left,Posterior Knee o Change dressing every day. Follow-up Appointments Wound #1 Left,Posterior Knee o Return Appointment in 1 week. Edema Control Wound #1 Left,Posterior Knee o Elevate legs to the level of the heart and pump ankles as often as possible Additional Orders / Instructions Wound #1 Left,Posterior Knee o Increase protein intake. Electronic Signature(s) Signed: 01/14/2017 4:37:19 PM By: Linton Ham MD Signed: 01/16/2017 5:06:34 PM By: Gretta Cool RN, BSN, Kim RN, BSN [name], [name] ([id number]) Entered By: Gretta Cool, RN, BSN, Kim on 01/14/2017 11:21:04 [name], [name] ([id number]) -------------------------------------------------------------------------------- Problem List Details [name], [name]. Date of Service: 01/14/2017 11:00 AM Medical Record Patient Account Number: [id number] Number: Treating RN: Cornell Barman December 21, 1933 (83 y.o. Other Clinician: Date of Birth/Sex: Female) Treating ROBSON, MICHAEL Primary Care Provider: Loura Pardon Provider/Extender: G Referring Provider: Loura Pardon Weeks in Treatment: 11 Active Problems ICD-10 Encounter Code Description Active Date Diagnosis M71.22 Synovial cyst of popliteal space [Baker], left knee 10/28/2016 Yes B95.61 Methicillin susceptible Staphylococcus aureus infection as 10/28/2016 Yes the cause of diseases classified elsewhere L02.416 Cutaneous abscess of left lower limb 10/28/2016 Yes Inactive Problems Resolved Problems Electronic Signature(s) Signed: 01/14/2017 4:37:19 PM By: Linton Ham MD Entered By: Linton Ham on 01/14/2017 11:24:43 [name], [name] ([id number]) -------------------------------------------------------------------------------- Progress Note Details [name]. Date of Service: 01/14/2017 11:00 AM Medical  Record Patient Account Number: [id number] Number: Treating RN: Cornell Barman 02-Feb-1934 (83 y.o. Other Clinician: Date of Birth/Sex: Female) Treating ROBSON, MICHAEL Primary Care Provider: Loura Pardon Provider/Extender: G Referring Provider: Loura Pardon Weeks in Treatment: 11 Subjective Chief Complaint Information obtained from Patient 10/28/16; patient is here for review of a draining area in her left popliteal fossa that is been present for 8 days History of Present Illness (HPI) 10/28/16; this is an 83 year old woman who arrives with a complicated medical issue. She  has had 3 separate knee replacements of her left knee. Initially in 2000 and apparently most recently in 2014. 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 Indeed she was hospitalized on 02/13/16 and discharged on 02/19/16. She was taken to the OR and it was found that the popliteal abscess was communicating with her joint space. Signed to feel fluid grew MSSA. Patient was treated with IandD of the abscess. She completed IV cefazolin on 03/28/16 and since then has been on Keflex and rifampin for chronic suppression. Her infectious disease doctor is Dr.Shrestha at [facility]. The patient was seen in our [place] sister clinic through much of October and November 2017. At that point she had a draining sinus that eventually closed over. She noted some pain behind her knee surrounding Christmas day and was seen in our emergency room on 10/23/16. During this ER visit she underwent tendon incision and drainage of a left Baker's cyst and clear colored synovial fluid was obtained. Doxycycline was added for a week to her Keflex and rifampin. A CT scan was ordered but I don't think was ever done due to artifacts from hardware. Her wound culture was negative. She has been using iodoform packing. She states her knee is painful when she stands on it for a period of time. She is not  systemically unwell. She has a history of scleroderma or without is been under control for some period of time. 11/04/16 the culture that I did of this area last week showed coag-negative staph and a few enterococcus faecium. Enterococcus is ampicillin resistant. The coag negative staph is only sensitive to vancomycin and tetracycline. Previous joint infection was methicillin sensitive staph aureus. It seems I misunderstood what she said and that her infectious disease clinic appointment is this Friday and the orthopedic appointment was last Friday. I faxed the culture results to the infectious disease clinic but I've given him a copy of it today. Per the patient and her family they orthopedic surgeon did not want to do anything further to this joint. The patient is not systemically unwell in particular no fever or chills and the pain she has is really minimal 11/18/16; this is a patient who has an infected left knee prosthetic joint. She went to see infectious disease at [facility] who apparently did did not recommend any further antibiotic therapy. She continues to feel the same episodic pain but no systemic symptoms of particular no fever. The depth of the wound in the popliteal fossa of her left knee is 3.5 versus 5 cm last week 11/25/16 4.5 cm of depth today. Draining exit site. We have treating this with iodoform packing however I really have no expectation of healing and to be truthful the amount of drainage that is coming out of this [name], [name]. ([id number]) knee I don't think closure of this site is necessarily considered a positive outcome. Her left knee remains warm but not painful there is an effusion she has no systemic symptoms no fever no chills or and she feels well 12/31/16. Still is same amount of depth today using a skinny at 4.6 cm. There is less drainage. We have been using iodoform packing and I really have no expectation of healing and to be truthful closing this sinus probably  is really not the best thing to consider. 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 She is not felt to be a candidate for a fourth replacement. In the meantime she does not complain  of pain except when the dressing is being changed episodically. She has not been systemically unwell no fever or chills or other issues. 01/14/17; after the patient left on 3/7 her daughter tells me the next day the wound had closed over and therefore this is not intact since. Over the last 3 days the patient has noted significant pain in the popliteal fossa of the involved left knee. 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 MSSA was the cause of the original identified joint infection treated at [facility]. Objective Constitutional Sitting or standing Blood Pressure is within target range for patient.. Pulse regular and within target range for patient.Marland Kitchen Respirations regular, non-labored and within target range.. Temperature is normal and within the target range for the patient.. Patient appears somewhat uncomfortable but is not in overt medical distress. Vitals Time Taken: 10:47 AM, Height: 66 in, Weight: 209 lbs, BMI: 33.7, Temperature: 97.6 F, Pulse: 54 bpm, Respiratory Rate: 16 breaths/min, Blood Pressure: 123/71 mmHg. Eyes Conjunctivae clear. No discharge.Marland Kitchen Respiratory Respiratory effort is easy and symmetric bilaterally. Rate is normal at rest and on room air.. Bilateral breath sounds are clear and equal in all lobes with no wheezes, rales or rhonchi.. Cardiovascular Heart rhythm and rate regular, without murmur or gallop.. Gastrointestinal (GI) No liver or spleen enlargement or tenderness.. Lymphatic None palpable in the inguinal area or cervical area.. Musculoskeletal The left knee joint itself continues to be swollen and warm. There is no doubt a small effusion. The knee is [name], [name].  ([id number]) not stable. Psychiatric No evidence of depression, anxiety, or agitation. Calm, cooperative, and communicative. Appropriate interactions and affect.. General Notes: Wound exam; the patient arrived with the sinus tract in the popliteal fossa closed over. According to her daughter this admitting closed over for almost 2 weeks. The patient started to develop pain 3 days ago. There was erythema around the sinus tract in the popliteal fossa and ballotable fluid. Using a #15 blade I did a small incision of this to express probably 30 cc of cloudy yellow fluid at the end of this some serosanguineous fluid. I took a sample of this fluid for culture. Integumentary (Hair, Skin) Patient does not have a rash. Wound #1 status is Open. Original cause of wound was Gradually Appeared. The wound is located on the Left,Posterior Knee. The wound measures 1.1cm length x 0.1cm width x 3.8cm depth; 0.086cm^2 area and 0.328cm^3 volume. There is no tunneling or undermining noted. There is a large amount of serous drainage noted. The wound margin is flat and intact. There is no granulation within the wound bed. There is no necrotic tissue within the wound bed. The periwound skin appearance exhibited: Scarring. The periwound skin appearance did not exhibit: Callus, Crepitus, Excoriation, Induration, Rash, Dry/Scaly, Maceration, Atrophie Blanche, Cyanosis, Ecchymosis, Hemosiderin Staining, Mottled, Pallor, Rubor, Erythema. Periwound temperature was noted as No Abnormality. The periwound has tenderness on palpation. Assessment Active Problems ICD-10 M71.22 - Synovial cyst of popliteal space [Baker], left knee B95.61 - Methicillin susceptible Staphylococcus aureus infection as the cause of diseases classified elsewhere L02.416 - Cutaneous abscess of left lower limb Procedures Wound #1 Wound #1 is a Cyst located on the Left, Posterior Knee . Abscess incision and drainage was provided by Ricard Dillon, MD. The skin was cleansed and prepped with anti-septic. An incision was made in the posterior knee with the following instrument(s): Blade. There was an immediate release of Serous fluid. A  Minimum amount of bleeding was controlled with Pressure. A time out was conducted at 11:00, prior to [name], [name]. ([id number]) the start of the procedure. Swab culture was sent. The procedure was tolerated well with a pain level of 1 throughout and a pain level of 0 following the procedure. Post procedure Diagnosis Wound #1: Same as Pre-Procedure Plan Wound Cleansing: Wound #1 Left,Posterior Knee: Clean wound with Normal Saline. Primary Wound Dressing: Wound #1 Left,Posterior Knee: Iodoform packing Gauze - 1/4 Secondary Dressing: Wound #1 Left,Posterior Knee: Boardered Foam Dressing Dressing Change Frequency: Wound #1 Left,Posterior Knee: Change dressing every day. Follow-up Appointments: Wound #1 Left,Posterior Knee: Return Appointment in 1 week. Edema Control: Wound #1 Left,Posterior Knee: Elevate legs to the level of the heart and pump ankles as often as possible Additional Orders / Instructions: Wound #1 Left,Posterior Knee: Increase protein intake. #1 the patient's draining sinus/wound in the left popliteal fossa had deteriorated markedly. Tender and erythematous with ballotable fluid. She required an IandD to express at least 30 cc of fluid a culture was obtained. Post IND the depth of this was 5 and half centimeters which I don't think is been as deep as this is been previously #2 I took a culture of this but did not alter her current suppressive antibiotics of Keflex and rifampin. Previously she is had coag-negative staph and enterococcus in fluid I of culture data this which of course are possible infectious etiologies of a prosthetic knee joint. I provided this information to infectious disease at [facility] however they did not alter antibiotics thinking this was a  contaminant [name], VICKREY. ([id number]) #3 I think the culture I got today was a true representation and not any contaminant since I had to do an IandD to get at the fluid. Once this comes back I'll contact infectious disease at [facility] to discuss. #4 as I stated before, I don't think it's in this patient's best interest to have this area close as this fluid seems to need a place to drain Electronic Signature(s) Signed: 01/14/2017 4:37:19 PM By: Linton Ham MD Entered By: Linton Ham on 01/14/2017 11:33:51 Ontko, [name] ([id number]) -------------------------------------------------------------------------------- SuperBill Details [name]. Date of Service: 01/14/2017 Medical Record Patient Account Number: [id number] Number: Treating RN: Cornell Barman [birth year] (83 y.o. Other Clinician: Date of Birth/Sex: Female) Treating ROBSON, MICHAEL Primary Care Provider: Loura Pardon Provider/Extender: G Referring Provider: Loura Pardon Service Line: Outpatient Weeks in Treatment: 11 Diagnosis Coding ICD-10 Codes Code Description M71.22 Synovial cyst of popliteal space [Baker], left knee Methicillin susceptible Staphylococcus aureus infection as the cause of diseases B95.61 classified elsewhere L02.416 Cutaneous abscess of left lower limb Facility Procedures CPT4 Code: 54627035 Description: 00938 - WOUND CARE VISIT-LEV 1 EST PT Modifier: Quantity: 1 CPT4 Code: [id number] Description: 69678 - I and D Abscess; simple/single ICD-10 Description Diagnosis L02.416 Cutaneous abscess of left lower limb M71.22 Synovial cyst of popliteal space [Baker], left kn Modifier: ee Quantity: 1 Physician Procedures CPT4: Description Modifier Quantity Code [id number] 51025 - WC PHYS LEVEL 4 - EST PT 25 1 ICD-10 Description Diagnosis L02.416 Cutaneous abscess of left lower limb M71.22 Synovial cyst of popliteal space [Baker], left knee B95.61 Methicillin susceptible   Staphylococcus aureus infection as the cause of diseases classified elsewhere CPT4: 8527782 42353 - I and D Abscess; simple/single 1 ICD-10 Description Diagnosis L02.416 Cutaneous abscess of left lower limb M71.22 Synovial cyst of popliteal space [Baker], left knee MAHNOOR, MATHISEN ([id number]) Electronic Signature(s) Signed: 01/14/2017 4:37:19 PM By: Dellia Nims,  Legrand Como MD Entered By: Linton Ham on 01/14/2017 11:34:54

## 2017-01-17 NOTE — Progress Notes (Signed)
IZZA, BICKLE ([id number]) Visit Report for 01/14/2017 Arrival Information Details [name], [name]. Date of Service: 01/14/2017 11:00 AM Medical Record Patient Account Number: [id number] Number: Treating RN: Cornell Barman 19-Jul-1934 (83 y.o. Other Clinician: Date of Birth/Sex: Female) Treating ROBSON, MICHAEL Primary Care Damaria Stofko: Loura Pardon Apple Dearmas/Extender: G Referring Nel Stoneking: Loura Pardon Weeks in Treatment: 11 Visit Information History Since Last Visit Added or deleted any medications: No Patient Arrived: Wheel Chair Any new allergies or adverse reactions: No Arrival Time: 10:45 Had a fall or experienced change in No Accompanied By: daughter activities of daily living that may affect Transfer Assistance: None risk of falls: Patient Identification Verified: Yes Signs or symptoms of abuse/neglect since last No Secondary Verification Process Yes visito Completed: Hospitalized since last visit: No Patient Has Alerts: Yes Has Dressing in Place as Prescribed: No Patient Alerts: ABI 08/07/16 Pain Present Now: Yes (R) 1.3 (L) 1.19 Electronic Signature(s) Signed: 01/16/2017 5:06:34 PM By: Gretta Cool, RN, BSN, Kim RN, BSN Entered By: Gretta Cool, RN, BSN, Kim on 01/14/2017 10:46:55 Homeyer, Blima Singer ([id number]) -------------------------------------------------------------------------------- Clinic Level of Care Assessment Details [name]. Date of Service: 01/14/2017 11:00 AM Medical Record Patient Account Number: [id number] Number: Treating RN: Cornell Barman 1934-03-25 (83 y.o. Other Clinician: Date of Birth/Sex: Female) Treating ROBSON, MICHAEL Primary Care Geo Slone: Loura Pardon Eulalia Ellerman/Extender: G Referring Osha Errico: Loura Pardon Weeks in Treatment: 11 Clinic Level of Care Assessment Items TOOL 3 Quantity Score []  - Use when EandM and Procedure is performed on FOLLOW-UP visit 0 ASSESSMENTS - Nursing Assessment /  Reassessment []  - Reassessment of Co-morbidities (includes updates in patient status) 0 X - Reassessment of Adherence to Treatment Plan 1 5 ASSESSMENTS - Wound and Skin Assessment / Reassessment []  - Points for Wound Assessment can only be taken for a new wound of unknown 0 or different etiology and a procedure is NOT performed to that wound []  - Simple Wound Assessment / Reassessment - one wound 0 []  - Complex Wound Assessment / Reassessment - multiple wounds 0 []  - Dermatologic / Skin Assessment (not related to wound area) 0 ASSESSMENTS - Focused Assessment []  - Circumferential Edema Measurements - multi extremities 0 []  - Nutritional Assessment / Counseling / Intervention 0 []  - Lower Extremity Assessment (monofilament, tuning fork, pulses) 0 []  - Peripheral Arterial Disease Assessment (using hand held doppler) 0 ASSESSMENTS - Ostomy and/or Continence Assessment and Care []  - Incontinence Assessment and Management 0 []  - Ostomy Care Assessment and Management (repouching, etc.) 0 PROCESS - Coordination of Care []  - Points for Discharge Coordination can only be taken for a new wound of 0 unknown or different etiology and a procedure is NOT performed to that wound []  - Simple Patient / Family Education for ongoing care 0 LINDA, GRIMMER. ([id number]) []  - Complex (extensive) Patient / Family Education for ongoing care 0 []  - Staff obtains Programmer, systems, Records, Test Results / Process Orders 0 []  - Staff telephones HHA, Nursing Homes / Clarify orders / etc 0 []  - Routine Transfer to another Facility (non-emergent condition) 0 []  - Routine Hospital Admission (non-emergent condition) 0 []  - New Admissions / Biomedical engineer / Ordering NPWT, Apligraf, etc. 0 []  - Emergency Hospital Admission (emergent condition) 0 []  - Simple Discharge Coordination 0 []  - Complex (extensive) Discharge Coordination 0 PROCESS - Special Needs []  - Pediatric / Minor Patient Management 0 []  - Isolation  Patient Management 0 []  - Hearing / Language / Visual special needs 0 []  - Assessment  of Community assistance (transportation, D/C planning, etc.) 0 []  - Additional assistance / Altered mentation 0 []  - Support Surface(s) Assessment (bed, cushion, seat, etc.) 0 INTERVENTIONS - Wound Cleansing / Measurement []  - Points for Wound Cleaning / Measurement, Wound Dressing, Specimen 0 Collection and Specimen taken to lab can only be taken for a new wound of unknown or different etiology and a procedure is NOT performed to that wound []  - Simple Wound Cleansing - one wound 0 []  - Complex Wound Cleansing - multiple wounds 0 []  - Wound Imaging (photographs - any number of wounds) 0 []  - Wound Tracing (instead of photographs) 0 []  - Simple Wound Measurement - one wound 0 []  - Complex Wound Measurement - multiple wounds 0 INTERVENTIONS - Wound Dressings Castrillon, Blima Singer ([id number]) X - Small Wound Dressing one or multiple wounds 1 10 []  - Medium Wound Dressing one or multiple wounds 0 []  - Large Wound Dressing one or multiple wounds 0 INTERVENTIONS - Miscellaneous []  - External ear exam 0 X - Specimen Collection (cultures, biopsies, blood, body fluids, etc.) 1 5 []  - Specimen(s) / Culture(s) sent or taken to Lab for analysis 0 []  - Patient Transfer (multiple staff / Civil Service fast streamer / Similar devices) 0 []  - Simple Staple / Suture removal (25 or less) 0 []  - Complex Staple / Suture removal (26 or more) 0 []  - Hypo / Hyperglycemic Management (close monitor of Blood Glucose) 0 []  - Ankle / Brachial Index (ABI) - do not check if billed separately 0 X - Vital Signs 1 5 Has the patient been seen at the hospital within the last three years: Yes Total Score: 25 Level Of Care: New/Established - Level 1 Electronic Signature(s) Signed: 01/16/2017 5:06:34 PM By: Gretta Cool, RN, BSN, Kim RN, BSN Entered By: Gretta Cool, RN, BSN, Kim on 01/14/2017 11:22:41 [name]  ([id number]) -------------------------------------------------------------------------------- Encounter Discharge Information Details [name]. Date of Service: 01/14/2017 11:00 AM Medical Record Patient Account Number: [id number] Number: Treating RN: Cornell Barman 1933-11-25 (83 y.o. Other Clinician: Date of Birth/Sex: Female) Treating ROBSON, MICHAEL Primary Care Jacksyn Beeks: Loura Pardon Charis Juliana/Extender: G Referring Jaydalee Bardwell: Ayesha Mohair in Treatment: 11 Encounter Discharge Information Items Discharge Pain Level: 0 Discharge Condition: Stable Ambulatory Status: Ambulatory Discharge Destination: Home Transportation: Private Auto Accompanied By: daughter Schedule Follow-up Appointment: Yes Medication Reconciliation completed and provided to Patient/Care Yes Haniyyah Sakuma: Provided on Clinical Summary of Care: 01/14/2017 Form Type Recipient Paper Patient FW Electronic Signature(s) Signed: 01/16/2017 5:06:34 PM By: Gretta Cool RN, BSN, Kim RN, BSN Previous Signature: 01/14/2017 11:22:23 AM Version By: Ruthine Dose Entered By: Gretta Cool RN, BSN, Kim on 01/14/2017 11:23:31 [name] ([id number]) -------------------------------------------------------------------------------- Lower Extremity Assessment Details [name]. Date of Service: 01/14/2017 11:00 AM Medical Record Patient Account Number: [id number] Number: Treating RN: Cornell Barman [birth year] (83 y.o. Other Clinician: Date of Birth/Sex: Female) Treating ROBSON, MICHAEL Primary Care Avo Schlachter: Loura Pardon Mihail Prettyman/Extender: G Referring Orlanda Lemmerman: Loura Pardon Weeks in Treatment: 11 Vascular Assessment Claudication: Claudication Assessment [Right:None] Pulses: Dorsalis Pedis Palpable: [Right:Yes] Posterior Tibial Palpable: [Right:Yes] Extremity colors, hair growth, and conditions: Extremity Color: [Right:Normal] Hair Growth on Extremity: [Right:No] Temperature  of Extremity: [Right:Warm] Dependent Rubor: [Right:No] Blanched when Elevated: [Right:No] Lipodermatosclerosis: [Right:No] Electronic Signature(s) Signed: 01/16/2017 5:06:34 PM By: Gretta Cool, RN, BSN, Kim RN, BSN Entered By: Gretta Cool, RN, BSN, Kim on 01/14/2017 10:50:45 [name] ([id number]) -------------------------------------------------------------------------------- Multi Wound Chart Details [name]. Date of Service: 01/14/2017 11:00 AM Medical Record  Patient Account Number: [id number] Number: Treating RN: Cornell Barman 12/16/1933 (83 y.o. Other Clinician: Date of Birth/Sex: Female) Treating ROBSON, MICHAEL Primary Care Joas Motton: Loura Pardon Haim Hansson/Extender: G Referring Kindel Rochefort: Loura Pardon Weeks in Treatment: 11 Vital Signs Height(in): 66 Pulse(bpm): 54 Weight(lbs): 209 Blood Pressure 123/71 (mmHg): Body Mass Index(BMI): 34 Temperature(F): 97.6 Respiratory Rate 16 (breaths/min): Photos: [1:No Photos] [N/A:N/A] Wound Location: [1:Left, Posterior Knee] [N/A:N/A] Wounding Event: [1:Gradually Appeared] [N/A:N/A] Primary Etiology: [1:Cyst] [N/A:N/A] Comorbid History: [1:Cataracts, Sleep Apnea, N/A Arrhythmia, Hypertension, Scleroderma] Date Acquired: [1:10/22/2016] [N/A:N/A] Weeks of Treatment: [1:11] [N/A:N/A] Wound Status: [1:Open] [N/A:N/A] Measurements L x W x D 1.1x0.1x3.8 [N/A:N/A] (cm) Area (cm) : [1:0.086] [N/A:N/A] Volume (cm) : [1:0.328] [N/A:N/A] % Reduction in Area: [1:68.70%] [N/A:N/A] % Reduction in Volume: -300.00% [N/A:N/A] Classification: [1:Full Thickness Without Exposed Support Structures] [N/A:N/A] Exudate Amount: [1:Large] [N/A:N/A] Exudate Type: [1:Serous] [N/A:N/A] Exudate Color: [1:amber] [N/A:N/A] Wound Margin: [1:Flat and Intact] [N/A:N/A] Granulation Amount: [1:None Present (0%)] [N/A:N/A] Necrotic Amount: [1:None Present (0%)] [N/A:N/A] Epithelialization: [1:None] [N/A:N/A] Periwound Skin Texture:  Scarring: Yes [1:Excoriation: No] [N/A:N/A] Induration: No Callus: No Crepitus: No Rash: No Periwound Skin Maceration: No N/A N/A Moisture: Dry/Scaly: No Periwound Skin Color: Atrophie Blanche: No N/A N/A Cyanosis: No Ecchymosis: No Erythema: No Hemosiderin Staining: No Mottled: No Pallor: No Rubor: No Temperature: No Abnormality N/A N/A Tenderness on Yes N/A N/A Palpation: Wound Preparation: Ulcer Cleansing: N/A N/A Rinsed/Irrigated with Saline Topical Anesthetic Applied: None Procedures Performed: Incision and Drainage N/A N/A Treatment Notes Wound #1 (Left, Posterior Knee) 1. Cleansed with: Clean wound with Normal Saline 4. Dressing Applied: Iodoform packing Gauze 5. Secondary Dressing Applied Bordered Foam Dressing Electronic Signature(s) Signed: 01/14/2017 4:37:19 PM By: Linton Ham MD Entered By: Linton Ham on 01/14/2017 11:24:52 [name] ([id number]) -------------------------------------------------------------------------------- [place] Details [name], [name]. Date of Service: 01/14/2017 11:00 AM Medical Record Patient Account Number: [id number] Number: Treating RN: Cornell Barman 22-Dec-1933 (83 y.o. Other Clinician: Date of Birth/Sex: Female) Treating ROBSON, MICHAEL Primary Care Markes Shatswell: Loura Pardon Brielle Moro/Extender: G Referring Aisea Bouldin: Loura Pardon Weeks in Treatment: 11 Active Inactive ` Abuse / Safety / Falls / Self Care Management Nursing Diagnoses: Impaired physical mobility Potential for falls Goals: Patient will remain injury free Date Initiated: 10/28/2016 Target Resolution Date: 02/17/2017 Goal Status: Active Interventions: Assess: immobility, friction, shearing, incontinence upon admission and as needed Notes: ` Orientation to the Wound Care Program Nursing Diagnoses: Knowledge deficit related to the wound healing center program Goals: Patient/caregiver will verbalize  understanding of the [place] Date Initiated: 10/28/2016 Target Resolution Date: 02/17/2017 Goal Status: Active Interventions: Provide education on orientation to the wound center Notes: ` Soft Tissue Infection ALLYSSIA, SKLUZACEK ([id number]) Nursing Diagnoses: Impaired tissue integrity Potential for infection: soft tissue Goals: Patient will remain free of wound infection Date Initiated: 10/28/2016 Target Resolution Date: 02/17/2017 Goal Status: Active Interventions: Assess signs and symptoms of infection every visit Treatment Activities: Culture and sensitivity : 10/28/2016 Notes: ` Wound/Skin Impairment Nursing Diagnoses: Impaired tissue integrity Goals: Ulcer/skin breakdown will heal within 14 weeks Date Initiated: 10/28/2016 Target Resolution Date: 02/17/2017 Goal Status: Active Interventions: Assess ulceration(s) every visit Notes: Electronic Signature(s) Signed: 01/16/2017 5:06:34 PM By: Gretta Cool, RN, BSN, Kim RN, BSN Entered By: Gretta Cool, RN, BSN, Kim on 01/14/2017 11:13:46 [name] ([id number]) -------------------------------------------------------------------------------- Pain Assessment Details [name]. Date of Service: 01/14/2017 11:00 AM Medical Record Patient Account Number: [id number] Number: Treating RN: Cornell Barman Jul 10, 1934 (83 y.o. Other Clinician: Date of Birth/Sex:  Female) Treating ROBSON, MICHAEL Primary Care Audriella Blakeley: Loura Pardon Zahria Ding/Extender: G Referring Fynn Vanblarcom: Loura Pardon Weeks in Treatment: 11 Active Problems Location of Pain Severity and Description of Pain Patient Has Paino No Site Locations With Dressing Change: Yes Duration of the Pain. Constant / Intermittento Intermittent Character of Pain Describe the Pain: Tender Pain Management and Medication Current Pain Management: Electronic Signature(s) Signed: 01/16/2017 5:06:34 PM By: Gretta Cool, RN, BSN, Kim RN, BSN Entered By: Gretta Cool, RN,  BSN, Kim on 01/14/2017 10:47:11 [name] ([id number]) -------------------------------------------------------------------------------- Patient/Caregiver Education Details [name]. Date of Service: 01/14/2017 11:00 AM Medical Record Patient Account Number: [id number] Number: Treating RN: Cornell Barman 1934/05/10 (83 y.o. Other Clinician: Date of Birth/Gender: Female) Treating ROBSON, MICHAEL Primary Care Physician: Loura Pardon Physician/Extender: G Referring Physician: Ayesha Mohair in Treatment: 11 Education Assessment Education Provided To: Patient Education Topics Provided Wound/Skin Impairment: Handouts: Caring for Your Ulcer, Other: pack wound daily; Methods: Demonstration, Explain/Verbal Responses: State content correctly Electronic Signature(s) Signed: 01/16/2017 5:06:34 PM By: Gretta Cool, RN, BSN, Kim RN, BSN Entered By: Gretta Cool, RN, BSN, Kim on 01/14/2017 11:24:02 [name] ([id number]) -------------------------------------------------------------------------------- Wound Assessment Details [name]. Date of Service: 01/14/2017 11:00 AM Medical Record Patient Account Number: [id number] Number: Treating RN: Cornell Barman 02-May-1934 (83 y.o. Other Clinician: Date of Birth/Sex: Female) Treating ROBSON, MICHAEL Primary Care Nevayah Faust: Loura Pardon Melaney Tellefsen/Extender: G Referring Callaway Hailes: Loura Pardon Weeks in Treatment: 11 Wound Status Wound Number: 1 Primary Cyst Etiology: Wound Location: Left, Posterior Knee Wound Open Wounding Event: Gradually Appeared Status: Date Acquired: 10/22/2016 Comorbid Cataracts, Sleep Apnea, Arrhythmia, Weeks Of Treatment: 11 History: Hypertension, Scleroderma Clustered Wound: No Photos Photo Uploaded By: Gretta Cool, RN, BSN, Kim on 01/14/2017 11:27:37 Wound Measurements Length: (cm) 1.1 % Reduction in Ar Width: (cm) 0.1 % Reduction in Vo Depth: (cm) 3.8  Epithelialization Area: (cm) 0.086 Tunneling: Volume: (cm) 0.328 Undermining: ea: 68.7% lume: -300% : None No No Wound Description Full Thickness Without Exposed Foul Odor After Classification: Support Structures Slough/Fibrino Wound Margin: Flat and Intact Exudate Large Amount: Exudate Type: Serous Exudate Color: amber Cleansing: No No Wound Bed Granulation Amount: None Present (0%) Necrotic Amount: None Present (0%) Wernert, Blima Singer ([id number]) Periwound Skin Texture Texture Color No Abnormalities Noted: No No Abnormalities Noted: No Callus: No Atrophie Blanche: No Crepitus: No Cyanosis: No Excoriation: No Ecchymosis: No Induration: No Erythema: No Rash: No Hemosiderin Staining: No Scarring: Yes Mottled: No Pallor: No Moisture Rubor: No No Abnormalities Noted: No Dry / Scaly: No Temperature / Pain Maceration: No Temperature: No Abnormality Tenderness on Palpation: Yes Wound Preparation Ulcer Cleansing: Rinsed/Irrigated with Saline Topical Anesthetic Applied: None Treatment Notes Wound #1 (Left, Posterior Knee) 1. Cleansed with: Clean wound with Normal Saline 4. Dressing Applied: Iodoform packing Gauze 5. Secondary Dressing Applied Bordered Foam Dressing Electronic Signature(s) Signed: 01/16/2017 5:06:34 PM By: Gretta Cool, RN, BSN, Kim RN, BSN Entered By: Gretta Cool, RN, BSN, Kim on 01/14/2017 11:13:38 [name] ([id number]) -------------------------------------------------------------------------------- Vitals Details [name]. Date of Service: 01/14/2017 11:00 AM Medical Record Patient Account Number: [id number] Number: Treating RN: Cornell Barman 1933/10/30 (83 y.o. Other Clinician: Date of Birth/Sex: Female) Treating ROBSON, MICHAEL Primary Care Gianni Fuchs: Loura Pardon Jacquelene Kopecky/Extender: G Referring Jull Harral: Loura Pardon Weeks in Treatment: 11 Vital Signs Time Taken: 10:47 Temperature (F): 97.6 Height (in):  66 Pulse (bpm): 54 Weight (lbs): 209 Respiratory Rate (breaths/min): 16 Body Mass Index (BMI): 33.7 Blood Pressure (mmHg): 123/71 Reference Range: 80 - 120 mg /  dl Electronic Signature(s) Signed: 01/16/2017 5:06:34 PM By: Gretta Cool, RN, BSN, Kim RN, BSN Entered By: Gretta Cool, RN, BSN, Kim on 01/14/2017 10:47:36

## 2017-01-21 ENCOUNTER — Encounter: Payer: Medicare Other | Admitting: Internal Medicine

## 2017-01-21 DIAGNOSIS — L02416 Cutaneous abscess of left lower limb: Secondary | ICD-10-CM | POA: Diagnosis not present

## 2017-01-23 NOTE — Progress Notes (Addendum)
[name], [name] ([id number]) Visit Report for 01/21/2017 Arrival Information Details [name], [name]. Date of Service: 01/21/2017 8:15 AM Medical Record Patient Account Number: [id number] Number: Treating RN: Baruch Gouty, RN, BSN, Rita Nov 27, 1933 [phone] y.o. Other Clinician: Date of Birth/Sex: Female) Treating ROBSON, MICHAEL Primary Care Daniqua Campoy: Loura Pardon Isolde Skaff/Extender: G Referring Dacen Frayre: Ayesha Mohair in Treatment: 12 Visit Information History Since Last Visit All ordered tests and consults were completed: No Patient Arrived: Wheel Chair Added or deleted any medications: No Arrival Time: 08:13 Any new allergies or adverse reactions: No Accompanied By: dtr Had a fall or experienced change in No Transfer Assistance: None activities of daily living that may affect Patient Identification Verified: Yes risk of falls: Secondary Verification Process Yes Signs or symptoms of abuse/neglect since last No Completed: visito Patient Has Alerts: Yes Hospitalized since last visit: No Patient Alerts: ABI 08/07/16 Has Dressing in Place as Prescribed: Yes (R) 1.3 (L) Pain Present Now: Yes 1.19 Electronic Signature(s) Signed: 01/21/2017 5:37:49 PM By: Regan Lemming BSN, RN Entered By: Regan Lemming on 01/21/2017 08:18:22 [name] ([id number]) -------------------------------------------------------------------------------- Clinic Level of Care Assessment Details [name]. Date of Service: 01/21/2017 8:15 AM Medical Record Patient Account Number: [id number] Number: Treating RN: Baruch Gouty, RN, BSN, Rita 1934/01/25 [phone] y.o. Other Clinician: Date of Birth/Sex: Female) Treating ROBSON, Sabin Primary Care Thaddius Manes: Loura Pardon Dayne Dekay/Extender: G Referring Ahana Najera: Ayesha Mohair in Treatment: 12 Clinic Level of Care Assessment Items TOOL 4 Quantity Score []  - Use when only an EandM is performed on FOLLOW-UP visit  0 ASSESSMENTS - Nursing Assessment / Reassessment X - Reassessment of Co-morbidities (includes updates in patient status) 1 10 X - Reassessment of Adherence to Treatment Plan 1 5 ASSESSMENTS - Wound and Skin Assessment / Reassessment X - Simple Wound Assessment / Reassessment - one wound 1 5 []  - Complex Wound Assessment / Reassessment - multiple wounds 0 []  - Dermatologic / Skin Assessment (not related to wound area) 0 ASSESSMENTS - Focused Assessment []  - Circumferential Edema Measurements - multi extremities 0 []  - Nutritional Assessment / Counseling / Intervention 0 []  - Lower Extremity Assessment (monofilament, tuning fork, pulses) 0 []  - Peripheral Arterial Disease Assessment (using hand held doppler) 0 ASSESSMENTS - Ostomy and/or Continence Assessment and Care []  - Incontinence Assessment and Management 0 []  - Ostomy Care Assessment and Management (repouching, etc.) 0 PROCESS - Coordination of Care X - Simple Patient / Family Education for ongoing care 1 15 []  - Complex (extensive) Patient / Family Education for ongoing care 0 []  - Staff obtains Programmer, systems, Records, Test Results / Process Orders 0 []  - Staff telephones HHA, Nursing Homes / Clarify orders / etc 0 [name], [name] ([id number]) []  - Routine Transfer to another Facility (non-emergent condition) 0 []  - Routine Hospital Admission (non-emergent condition) 0 []  - New Admissions / Biomedical engineer / Ordering NPWT, Apligraf, etc. 0 []  - Emergency Hospital Admission (emergent condition) 0 []  - Simple Discharge Coordination 0 []  - Complex (extensive) Discharge Coordination 0 PROCESS - Special Needs []  - Pediatric / Minor Patient Management 0 []  - Isolation Patient Management 0 []  - Hearing / Language / Visual special needs 0 []  - Assessment of Community assistance (transportation, D/C planning, etc.) 0 []  - Additional assistance / Altered mentation 0 []  - Support Surface(s) Assessment (bed, cushion, seat, etc.)  0 INTERVENTIONS - Wound Cleansing / Measurement X - Simple Wound Cleansing - one wound 1 5 []  - Complex Wound Cleansing -  multiple wounds 0 X - Wound Imaging (photographs - any number of wounds) 1 5 []  - Wound Tracing (instead of photographs) 0 X - Simple Wound Measurement - one wound 1 5 []  - Complex Wound Measurement - multiple wounds 0 INTERVENTIONS - Wound Dressings []  - Small Wound Dressing one or multiple wounds 0 []  - Medium Wound Dressing one or multiple wounds 0 []  - Large Wound Dressing one or multiple wounds 0 []  - Application of Medications - topical 0 []  - Application of Medications - injection 0 [name], [name] ([id number]) INTERVENTIONS - Miscellaneous []  - External ear exam 0 []  - Specimen Collection (cultures, biopsies, blood, body fluids, etc.) 0 []  - Specimen(s) / Culture(s) sent or taken to Lab for analysis 0 []  - Patient Transfer (multiple staff / Harrel Lemon Lift / Similar devices) 0 []  - Simple Staple / Suture removal (25 or less) 0 []  - Complex Staple / Suture removal (26 or more) 0 []  - Hypo / Hyperglycemic Management (close monitor of Blood Glucose) 0 []  - Ankle / Brachial Index (ABI) - do not check if billed separately 0 X - Vital Signs 1 5 Has the patient been seen at the hospital within the last three years: Yes Total Score: 55 Level Of Care: New/Established - Level 2 Electronic Signature(s) Signed: 01/21/2017 5:37:49 PM By: Regan Lemming BSN, RN Entered By: Regan Lemming on 01/21/2017 08:46:24 [name] ([id number]) -------------------------------------------------------------------------------- Encounter Discharge Information Details [name]. Date of Service: 01/21/2017 8:15 AM Medical Record Patient Account Number: [id number] Number: Treating RN: Baruch Gouty, RN, BSN, Rita Jun 17, 1934 [phone] y.o. Other Clinician: Date of Birth/Sex: Female) Treating ROBSON, MICHAEL Primary Care Oziah Vitanza: Loura Pardon Rudransh Bellanca/Extender: G Referring  Zeda Gangwer: Ayesha Mohair in Treatment: 12 Encounter Discharge Information Items Discharge Pain Level: 0 Discharge Condition: Stable Ambulatory Status: Wheelchair Discharge Destination: Home Transportation: Private Auto Accompanied By: dtr Schedule Follow-up Appointment: No Medication Reconciliation completed and provided to Patient/Care No Leoma Folds: Provided on Clinical Summary of Care: 01/21/2017 Form Type Recipient Paper Patient FW Electronic Signature(s) Signed: 01/21/2017 5:37:49 PM By: Regan Lemming BSN, RN Previous Signature: 01/21/2017 8:46:49 AM Version By: Ruthine Dose Entered By: Regan Lemming on 01/21/2017 08:47:12 Beauregard, [name] ([id number]) -------------------------------------------------------------------------------- Lower Extremity Assessment Details [name]. Date of Service: 01/21/2017 8:15 AM Medical Record Patient Account Number: [id number] Number: Treating RN: Baruch Gouty, RN, BSN, Rita [birth year] [phone] y.o. Other Clinician: Date of Birth/Sex: Female) Treating ROBSON, MICHAEL Primary Care Robbert Langlinais: Loura Pardon Alexys Gassett/Extender: G Referring Jolanda Mccann: Loura Pardon Weeks in Treatment: 12 Electronic Signature(s) Signed: 01/21/2017 5:37:49 PM By: Regan Lemming BSN, RN Entered By: Regan Lemming on 01/21/2017 08:22:58 [name] ([id number]) -------------------------------------------------------------------------------- Multi Wound Chart Details [name]. Date of Service: 01/21/2017 8:15 AM Medical Record Patient Account Number: [id number] Number: Treating RN: Baruch Gouty, RN, BSN, Rita 27-Jun-1934 [phone] y.o. Other Clinician: Date of Birth/Sex: Female) Treating ROBSON, MICHAEL Primary Care Lekeisha Arenas: Loura Pardon Jezabelle Chisolm/Extender: G Referring Denise Washburn: Loura Pardon Weeks in Treatment: 12 Vital Signs Height(in): 66 Pulse(bpm): 57 Weight(lbs): 209 Blood Pressure 128/65 (mmHg): Body Mass Index(BMI):  34 Temperature(F): 97.6 Respiratory Rate 17 (breaths/min): Photos: [1:No Photos] [N/A:N/A] Wound Location: [1:Left Knee - Posterior] [N/A:N/A] Wounding Event: [1:Gradually Appeared] [N/A:N/A] Primary Etiology: [1:Cyst] [N/A:N/A] Comorbid History: [1:Cataracts, Sleep Apnea, N/A Arrhythmia, Hypertension, Scleroderma] Date Acquired: [1:10/22/2016] [N/A:N/A] Weeks of Treatment: [1:12] [N/A:N/A] Wound Status: [1:Open] [N/A:N/A] Measurements L x W x D 1x0.1x3.5 [N/A:N/A] (cm) Area (cm) : [1:0.079] [N/A:N/A] Volume (cm) : [1:0.275] [  N/A:N/A] % Reduction in Area: [1:71.30%] [N/A:N/A] % Reduction in Volume: -235.40% [N/A:N/A] Classification: [1:Full Thickness Without Exposed Support Structures] [N/A:N/A] Exudate Amount: [1:Large] [N/A:N/A] Exudate Type: [1:Serous] [N/A:N/A] Exudate Color: [1:amber] [N/A:N/A] Wound Margin: [1:Flat and Intact] [N/A:N/A] Granulation Amount: [1:None Present (0%)] [N/A:N/A] Necrotic Amount: [1:None Present (0%)] [N/A:N/A] Epithelialization: [1:None] [N/A:N/A] Periwound Skin Texture: Scarring: Yes [1:Excoriation: No] [N/A:N/A] Induration: No Callus: No Crepitus: No Rash: No Periwound Skin Maceration: No N/A N/A Moisture: Dry/Scaly: No Periwound Skin Color: Atrophie Blanche: No N/A N/A Cyanosis: No Ecchymosis: No Erythema: No Hemosiderin Staining: No Mottled: No Pallor: No Rubor: No Temperature: No Abnormality N/A N/A Tenderness on Yes N/A N/A Palpation: Wound Preparation: Ulcer Cleansing: N/A N/A Rinsed/Irrigated with Saline Topical Anesthetic Applied: None Treatment Notes Wound #1 (Left, Posterior Knee) 1. Cleansed with: Clean wound with Normal Saline 4. Dressing Applied: Iodoform packing Gauze 5. Secondary Dressing Applied ABD Pad 7. Secured with Recruitment consultant) Signed: 01/22/2017 5:50:35 AM By: Linton Ham MD Entered By: Linton Ham on 01/21/2017 08:47:31 [name], [name]  ([id number]) -------------------------------------------------------------------------------- Trainer Details [name], [name]. Date of Service: 01/21/2017 8:15 AM Medical Record Patient Account Number: [id number] Number: Treating RN: Baruch Gouty, RN, BSN, Rita [birth year] [phone] y.o. Other Clinician: Date of Birth/Sex: Female) Treating ROBSON, MICHAEL Primary Care Carmela Piechowski: Loura Pardon Cassidey Barrales/Extender: G Referring Verline Kong: Ayesha Mohair in Treatment: 12 Active Inactive Electronic Signature(s) Signed: 02/06/2017 11:01:07 AM By: Gretta Cool RN, BSN, Kim RN, BSN Signed: 02/24/2017 4:24:41 PM By: Regan Lemming BSN, RN Previous Signature: 01/21/2017 5:37:49 PM Version By: Regan Lemming BSN, RN Entered By: Gretta Cool, RN, BSN, Kim on 02/06/2017 11:01:06 [name] ([id number]) -------------------------------------------------------------------------------- Pain Assessment Details [name], [name]. Date of Service: 01/21/2017 8:15 AM Medical Record Patient Account Number: [id number] Number: Treating RN: Baruch Gouty, RN, BSN, Rita 04/12/1934 [phone] y.o. Other Clinician: Date of Birth/Sex: Female) Treating ROBSON, MICHAEL Primary Care Detravion Tester: Loura Pardon [name] Moro/Extender: G Referring Json Koelzer: Loura Pardon Weeks in Treatment: 12 Active Problems Location of Pain Severity and Description of Pain Patient Has Paino Yes Site Locations Pain Location: Generalized Pain, Pain in Ulcers Rate the pain. Current Pain Level: 3 Character of Pain Describe the Pain: Tender Pain Management and Medication Current Pain Management: Electronic Signature(s) Signed: 01/21/2017 5:37:49 PM By: Regan Lemming BSN, RN Entered By: Regan Lemming on 01/21/2017 08:18:35 [name] ([id number]) -------------------------------------------------------------------------------- Patient/Caregiver Education Details [name]. Date of Service: 01/21/2017  8:15 AM Medical Record Patient Account Number: [id number] Number: Treating RN: Baruch Gouty, RN, BSN, Rita 12/11/1933 [phone] y.o. Other Clinician: Date of Birth/Gender: Female) Treating ROBSON, MICHAEL Primary Care Physician: Loura Pardon Physician/Extender: G Referring Physician: Ayesha Mohair in Treatment: 12 Education Assessment Education Provided To: Patient Education Topics Provided Basic Hygiene: Methods: Explain/Verbal Responses: State content correctly Welcome To [facility]: Methods: Explain/Verbal Electronic Signature(s) Signed: 01/21/2017 5:37:49 PM By: Regan Lemming BSN, RN Entered By: Regan Lemming on 01/21/2017 08:47:24 [name] ([id number]) -------------------------------------------------------------------------------- Wound Assessment Details [name]. Date of Service: 01/21/2017 8:15 AM Medical Record Patient Account Number: [id number] Number: Treating RN: Baruch Gouty, RN, BSN, Rita 04-02-1934 [phone] y.o. Other Clinician: Date of Birth/Sex: Female) Treating ROBSON, MICHAEL Primary Care Khalik Pewitt: Loura Pardon Rayla Pember/Extender: G Referring Ferdie Bakken: Loura Pardon Weeks in Treatment: 12 Wound Status Wound Number: 1 Primary Cyst Etiology: Wound Location: Left Knee - Posterior Wound Open Wounding Event: Gradually Appeared Status: Date Acquired: 10/22/2016 Comorbid Cataracts, Sleep Apnea, Arrhythmia, Weeks Of Treatment: 12 History:  Hypertension, Scleroderma Clustered Wound: No Photos Photo Uploaded By: Regan Lemming on 01/21/2017 17:19:07 Wound Measurements Length: (cm) 1 Width: (cm) 0.1 Depth: (cm) 3.5 Area: (cm) 0.079 Volume: (cm) 0.275 % Reduction in Area: 71.3% % Reduction in Volume: -235.4% Epithelialization: None Tunneling: No Undermining: No Wound Description Full Thickness Without Exposed Foul Odor Aft Classification: Support Structures Slough/Fibrin Wound Margin: Flat and Intact [name], [name] ([id number]) er Cleansing: No o No Exudate Large Amount: Exudate Type: Serous Exudate Color: amber Wound Bed Granulation Amount: None Present (0%) Necrotic Amount: None Present (0%) Periwound Skin Texture Texture Color No Abnormalities Noted: No No Abnormalities Noted: No Callus: No Atrophie Blanche: No Crepitus: No Cyanosis: No Excoriation: No Ecchymosis: No Induration: No Erythema: No Rash: No Hemosiderin Staining: No Scarring: Yes Mottled: No Pallor: No Moisture Rubor: No No Abnormalities Noted: No Dry / Scaly: No Temperature / Pain Maceration: No Temperature: No Abnormality Tenderness on Palpation: Yes Wound Preparation Ulcer Cleansing: Rinsed/Irrigated with Saline Topical Anesthetic Applied: None Electronic Signature(s) Signed: 01/21/2017 5:37:49 PM By: Regan Lemming BSN, RN Entered By: Regan Lemming on 01/21/2017 08:44:35 [name] ([id number]) -------------------------------------------------------------------------------- Vitals Details [name]. Date of Service: 01/21/2017 8:15 AM Medical Record Patient Account Number: [id number] Number: Treating RN: Baruch Gouty, RN, BSN, Rita January 18, 1934 [phone] y.o. Other Clinician: Date of Birth/Sex: Female) Treating ROBSON, MICHAEL Primary Care Kate Sweetman: Loura Pardon Jamere Stidham/Extender: G Referring Clova Morlock: Loura Pardon Weeks in Treatment: 12 Vital Signs Time Taken: 08:16 Temperature (F): 97.6 Height (in): 66 Pulse (bpm): 57 Weight (lbs): 209 Respiratory Rate (breaths/min): 17 Body Mass Index (BMI): 33.7 Blood Pressure (mmHg): 128/65 Reference Range: 80 - 120 mg / dl Electronic Signature(s) Signed: 01/21/2017 5:37:49 PM By: Regan Lemming BSN, RN Entered By: Regan Lemming on 01/21/2017 08:22:46

## 2017-01-23 NOTE — Progress Notes (Signed)
SEKAI, NAYAK ([id number]) Visit Report for 01/21/2017 Chief Complaint Document Details [name], [name]. Date of Service: 01/21/2017 8:15 AM Medical Record Patient Account Number: [id number] Number: Treating RN: Baruch Gouty, RN, BSN, Rita November 02, 1933 [phone] y.o. Other Clinician: Date of Birth/Sex: Female) Treating Brady Plant Primary Care Provider: Loura Pardon Provider/Extender: G Referring Provider: Ayesha Mohair in Treatment: 12 Information Obtained from: Patient Chief Complaint 10/28/16; patient is here for review of a draining area in her left popliteal fossa that is been present for 8 days Electronic Signature(s) Signed: 01/22/2017 5:50:35 AM By: Linton Ham MD Entered By: Linton Ham on 01/21/2017 08:47:37 [name], [name] ([id number]) -------------------------------------------------------------------------------- HPI Details [name]. Date of Service: 01/21/2017 8:15 AM Medical Record Patient Account Number: [id number] Number: Treating RN: Baruch Gouty, RN, BSN, Rita [birth year] [phone] y.o. Other Clinician: Date of Birth/Sex: Female) Treating Abhiraj Dozal Primary Care Provider: Loura Pardon Provider/Extender: G Referring Provider: Loura Pardon Weeks in Treatment: 12 History of Present Illness HPI Description: 10/28/16; this is an 83 year old woman who arrives with a complicated medical issue. 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 Initially in 2000 and apparently most recently in 2014. 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 Indeed she was hospitalized on 02/13/16 and discharged on 02/19/16. She was taken to the OR and it was found that the popliteal abscess was communicating with her joint space. Signed to feel fluid grew MSSA. Patient was treated with IandD of the abscess. She completed IV cefazolin on 03/28/16 and since then has been on Keflex and rifampin for  chronic suppression. Her infectious disease doctor is Dr.Shrestha at [facility]. The patient was seen in our [place] sister clinic through much of October and November 2017. At that point she had a draining sinus that eventually closed over. She noted some pain behind her knee surrounding Christmas day and was seen in our emergency room on 10/23/16. During this ER visit she underwent tendon incision and drainage of a left Baker's cyst and clear colored synovial fluid was obtained. Doxycycline was added for a week to her Keflex and rifampin. A CT scan was ordered but I don't think was ever done due to artifacts from hardware. Her wound culture was negative. She has been using iodoform packing. She states her knee is painful when she stands on it for a period of time. She is not systemically unwell. She has a history of scleroderma or without is been under control for some period of time. 11/04/16 the culture that I did of this area last week showed coag-negative staph and a few enterococcus faecium. Enterococcus is ampicillin resistant. The coag negative staph is only sensitive to vancomycin and tetracycline. Previous joint infection was methicillin sensitive staph aureus. It seems I misunderstood what she said and that her infectious disease clinic appointment is this Friday and the orthopedic appointment was last Friday. I faxed the culture results to the infectious disease clinic but I've given him a copy of it today. Per the patient and her family they orthopedic surgeon did not want to do anything further to this joint. The patient is not systemically unwell in particular no fever or chills and the pain she has is really minimal 11/18/16; this is a patient who has an infected left knee prosthetic joint. She went to see infectious disease at [facility] who apparently did did not recommend any further antibiotic therapy. She continues to feel the same episodic pain but  no systemic symptoms of  particular no fever. The depth of the wound in the popliteal fossa of her left knee is 3.5 versus 5 cm last week 11/25/16 4.5 cm of depth today. Draining exit site. We have treating this with iodoform packing however I really have no expectation of healing and to be truthful the amount of drainage that is coming out of this knee I don't think closure of this site is necessarily considered a positive outcome. Her left knee remains warm but not painful there is an effusion she has no systemic symptoms no fever no chills or and she feels well 12/31/16. Still is same amount of depth today using a skinny at 4.6 cm. There is less drainage. We have been using iodoform packing and I really have no expectation of healing and to be truthful closing this sinus probably is really not the best thing to consider. 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 [name], [name]. ([id number]) She is not felt to be a candidate for a fourth replacement. In the meantime she does not complain of pain except when the dressing is being changed episodically. She has not been systemically unwell no fever or chills or other issues. 01/14/17; after the patient left on 3/7 her daughter tells me the next day the wound had closed over and therefore this is not intact since. Over the last 3 days the patient has noted significant pain in the popliteal fossa of the involved left knee. 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 MSSA was the cause of the original identified joint infection treated at [facility]. 01/21/17; I saw this patient last week at which time there was intense erythema in the popliteal fossa, tenderness and a ballotable fluid collection. I did an IandD on this. Surprisingly the culture of this was negative although as a result of the IandD her tenderness and erythema in the area is resolved. We continue with the iodoform  packing Electronic Signature(s) Signed: 01/22/2017 5:50:35 AM By: Linton Ham MD Entered By: Linton Ham on 01/21/2017 08:48:26 [name] ([id number]) -------------------------------------------------------------------------------- Physical Exam Details [name]. Date of Service: 01/21/2017 8:15 AM Medical Record Patient Account Number: [id number] Number: Treating RN: Baruch Gouty, RN, BSN, Rita 04/04/1934 [phone] y.o. Other Clinician: Date of Birth/Sex: Female) Treating Marlo Goodrich Primary Care Provider: Loura Pardon Provider/Extender: G Referring Provider: Loura Pardon Weeks in Treatment: 12 Constitutional Sitting or standing Blood Pressure is within target range for patient.. Pulse regular and within target range for patient.Marland Kitchen Respirations regular, non-labored and within target range.. Temperature is normal and within the target range for the patient.. She does not look systemically unwell. Respiratory Respiratory effort is easy and symmetric bilaterally. Rate is normal at rest and on room air.Marland Kitchen Lymphatic None palpable in the popliteal area.. Musculoskeletal The status of her knee is about the same on the left. Some swelling warmth small effusion. Psychiatric No evidence of depression, anxiety, or agitation. Calm, cooperative, and communicative. Appropriate interactions and affect.. Notes Wound exam; the IandD site I opened last week looks stable. The erythema in this area has totally resolved. There is no tenderness. She still has clear fluid draining. There is no evidence of an acute active infection Electronic Signature(s) Signed: 01/22/2017 5:50:35 AM By: Linton Ham MD Entered By: Linton Ham on 01/21/2017 08:50:31 [name], [name] ([id number]) -------------------------------------------------------------------------------- Physician Orders Details [name]. Date of Service:  01/21/2017 8:15 AM Medical Record  Patient Account Number: [id number] Number: Treating RN: Baruch Gouty, RN, BSN, Rita 1934/08/20 [phone] y.o. Other Clinician: Date of Birth/Sex: Female) Treating Elisia Stepp Primary Care Provider: Loura Pardon Provider/Extender: G Referring Provider: Loura Pardon Weeks in Treatment: 12 Verbal / Phone Orders: No Diagnosis Coding Wound Cleansing Wound #1 Left,Posterior Knee o Clean wound with Normal Saline. Primary Wound Dressing Wound #1 Left,Posterior Knee o Iodoform packing Gauze - 1/4 Secondary Dressing Wound #1 Left,Posterior Knee o ABD pad Dressing Change Frequency Wound #1 Left,Posterior Knee o Change dressing every day. Follow-up Appointments Wound #1 Left,Posterior Knee o Return Appointment in: - Call as needed Edema Control Wound #1 Left,Posterior Knee o Elevate legs to the level of the heart and pump ankles as often as possible Additional Orders / Instructions Wound #1 Left,Posterior Knee o Increase protein intake. Electronic Signature(s) Signed: 01/21/2017 5:37:49 PM By: Regan Lemming BSN, RN Signed: 01/22/2017 5:50:35 AM By: Linton Ham MD [name] ([id number]) Entered By: Regan Lemming on 01/21/2017 08:45:51 [name], [name] ([id number]) -------------------------------------------------------------------------------- Problem List Details Patient Name: ZSOFIA, PROUT. Date of Service: 01/21/2017 8:15 AM Medical Record Patient Account Number: [id number] Number: Treating RN: Baruch Gouty, RN, BSN, Rita 1934-09-22 [phone] y.o. Other Clinician: Date of Birth/Sex: Female) Treating Joseguadalupe Stan Primary Care Provider: Loura Pardon Provider/Extender: G Referring Provider: Loura Pardon Weeks in Treatment: 12 Active Problems ICD-10 Encounter Code Description Active Date Diagnosis M71.22 Synovial cyst of popliteal space [Baker], left knee 10/28/2016 Yes B95.61 Methicillin susceptible Staphylococcus aureus infection as 10/28/2016 Yes the cause  of diseases classified elsewhere L02.416 Cutaneous abscess of left lower limb 10/28/2016 Yes Inactive Problems Resolved Problems Electronic Signature(s) Signed: 01/22/2017 5:50:35 AM By: Linton Ham MD Entered By: Linton Ham on 01/21/2017 08:47:21 [name], [name] ([id number]) -------------------------------------------------------------------------------- Progress Note Details [name]. Date of Service: 01/21/2017 8:15 AM Medical Record Patient Account Number: [id number] Number: Treating RN: Baruch Gouty, RN, BSN, Rita [birth year] [phone] y.o. Other Clinician: Date of Birth/Sex: Female) Treating Marcel Sorter Primary Care Provider: Loura Pardon Provider/Extender: G Referring Provider: Loura Pardon Weeks in Treatment: 12 Subjective Chief Complaint Information obtained from Patient 10/28/16; patient is here for review of a draining area in her left popliteal fossa that is been present for 8 days History of Present Illness (HPI) 10/28/16; this is an 83 year old woman who arrives with a complicated medical issue. 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 Initially in 2000 and apparently most recently in 2014. 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 Indeed she was hospitalized on 02/13/16 and discharged on 02/19/16. She was taken to the OR and it was found that the popliteal abscess was communicating with her joint space. Signed to feel fluid grew MSSA. Patient was treated with IandD of the abscess. She completed IV cefazolin on 03/28/16 and since then has been on Keflex and rifampin for chronic suppression. Her infectious disease doctor is Dr.Shrestha at [facility]. The patient was seen in our [place] sister clinic through much of October and November 2017. At that point she had a draining sinus that eventually closed over. She noted some pain behind her knee surrounding Christmas day and was seen in our  emergency room on 10/23/16. During this ER visit she underwent tendon incision and drainage of a left Baker's cyst and clear colored synovial fluid was obtained. Doxycycline was added for a week to her Keflex and rifampin. A CT scan was ordered but I don't  think was ever done due to artifacts from hardware. Her wound culture was negative. She has been using iodoform packing. She states her knee is painful when she stands on it for a period of time. She is not systemically unwell. She has a history of scleroderma or without is been under control for some period of time. 11/04/16 the culture that I did of this area last week showed coag-negative staph and a few enterococcus faecium. Enterococcus is ampicillin resistant. The coag negative staph is only sensitive to vancomycin and tetracycline. Previous joint infection was methicillin sensitive staph aureus. It seems I misunderstood what she said and that her infectious disease clinic appointment is this Friday and the orthopedic appointment was last Friday. I faxed the culture results to the infectious disease clinic but I've given him a copy of it today. Per the patient and her family they orthopedic surgeon did not want to do anything further to this joint. The patient is not systemically unwell in particular no fever or chills and the pain she has is really minimal 11/18/16; this is a patient who has an infected left knee prosthetic joint. She went to see infectious disease at [facility] who apparently did did not recommend any further antibiotic therapy. She continues to feel the same episodic pain but no systemic symptoms of particular no fever. The depth of the wound in the popliteal fossa of her left knee is 3.5 versus 5 cm last week 11/25/16 4.5 cm of depth today. Draining exit site. We have treating this with iodoform packing however I really have no expectation of healing and to be truthful the amount of drainage that is coming out of this [name], [name]. ([id number]) knee I don't think closure of this site is necessarily considered a positive outcome. Her left knee remains warm but not painful there is an effusion she has no systemic symptoms no fever no chills or and she feels well 12/31/16. Still is same amount of depth today using a skinny at 4.6 cm. There is less drainage. We have been using iodoform packing and I really have no expectation of healing and to be truthful closing this sinus probably is really not the best thing to consider. 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 She is not felt to be a candidate for a fourth replacement. In the meantime she does not complain of pain except when the dressing is being changed episodically. She has not been systemically unwell no fever or chills or other issues. 01/14/17; after the patient left on 3/7 her daughter tells me the next day the wound had closed over and therefore this is not intact since. Over the last 3 days the patient has noted significant pain in the popliteal fossa of the involved left knee. 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 MSSA was the cause of the original identified joint infection treated at [facility]. 01/21/17; I saw this patient last week at which time there was intense erythema in the popliteal fossa, tenderness and a ballotable fluid collection. I did an IandD on this. Surprisingly the culture of this was negative although as a result of the IandD her tenderness and erythema in the area is resolved. We continue with the iodoform packing Objective Constitutional Sitting or standing Blood Pressure is within target range for patient.. Pulse regular and within target range for patient.Marland Kitchen Respirations regular, non-labored and within target range.. Temperature is  normal and within the target range for the patient.. She does not look systemically unwell. Vitals Time  Taken: 8:16 AM, Height: 66 in, Weight: 209 lbs, BMI: 33.7, Temperature: 97.6 F, Pulse: 57 bpm, Respiratory Rate: 17 breaths/min, Blood Pressure: 128/65 mmHg. Respiratory Respiratory effort is easy and symmetric bilaterally. Rate is normal at rest and on room air.Marland Kitchen Lymphatic None palpable in the popliteal area.. Musculoskeletal The status of her knee is about the same on the left. Some swelling warmth small effusion. Psychiatric No evidence of depression, anxiety, or agitation. Calm, cooperative, and communicative. Appropriate interactions and affect.. General Notes: Wound exam; the IandD site I opened last week looks stable. The erythema in this area has [name], [name]. ([id number]) totally resolved. There is no tenderness. She still has clear fluid draining. There is no evidence of an acute active infection Integumentary (Hair, Skin) Wound #1 status is Open. Original cause of wound was Gradually Appeared. The wound is located on the Left,Posterior Knee. The wound measures 1cm length x 0.1cm width x 3.5cm depth; 0.079cm^2 area and 0.275cm^3 volume. There is no tunneling or undermining noted. There is a large amount of serous drainage noted. The wound margin is flat and intact. There is no granulation within the wound bed. There is no necrotic tissue within the wound bed. The periwound skin appearance exhibited: Scarring. The periwound skin appearance did not exhibit: Callus, Crepitus, Excoriation, Induration, Rash, Dry/Scaly, Maceration, Atrophie Blanche, Cyanosis, Ecchymosis, Hemosiderin Staining, Mottled, Pallor, Rubor, Erythema. Periwound temperature was noted as No Abnormality. The periwound has tenderness on palpation. Assessment Active Problems ICD-10 M71.22 - Synovial cyst of popliteal space [Baker], left knee B95.61 - Methicillin susceptible Staphylococcus aureus infection as the cause of diseases classified elsewhere L02.416 - Cutaneous abscess of left lower limb Plan Wound  Cleansing: Wound #1 Left,Posterior Knee: Clean wound with Normal Saline. Primary Wound Dressing: Wound #1 Left,Posterior Knee: Iodoform packing Gauze - 1/4 Secondary Dressing: Wound #1 Left,Posterior Knee: ABD pad Dressing Change Frequency: Wound #1 Left,Posterior Knee: Change dressing every day. Follow-up Appointments: Wound #1 Left,Posterior Knee: Return Appointment in: - Call as needed Edema Control: Wound #1 Left,Posterior Knee: [name], [name]. ([id number]) Elevate legs to the level of the heart and pump ankles as often as possible Additional Orders / Instructions: Wound #1 Left,Posterior Knee: Increase protein intake. #1 surprisingly, at least to me, the fluid that I obtained last week culture negative [30 cc or more [1 I open the area in the left popliteal fossa #2 and a she has an chronic infection in this left knee and for one reason or another this area needs to be opened to provide drainage #3 she has chronic infection in the left knee which is a prosthetic knee joint. She remains on Keflex and rifampin chronically to suppress MSSA infection #4 I think she will need to continue with the out a form packing to the area on the left popliteal fossa and she was fully I think this area needs to be opened to drain or L she develops unrelenting pain erythema which requires an IandD #5 I don't think she needs to be followed here by left this up to the patient and her daughter to call us if there is further necessity for [place] to see this. I think at some point the infection in the knee is going to become a systemic issue but for now everything appears to be stable. She is already been told that if this happens she may require a left above-knee amputation. There  is no plans to remove the artificial joint as I understand this Electronic Signature(s) Signed: 01/22/2017 5:50:35 AM By: Linton Ham MD Entered By: Linton Ham on 01/21/2017 08:52:58 [name]  ([id number]) -------------------------------------------------------------------------------- SuperBill Details [name]. Date of Service: 01/21/2017 Medical Record Patient Account Number: [id number] Number: Treating RN: Baruch Gouty, RN, BSN, Rita Jul 29, 1934 [phone] y.o. Other Clinician: Date of Birth/Sex: Female) Treating Nimrat Woolworth Primary Care Provider: Loura Pardon Provider/Extender: G Referring Provider: Loura Pardon Weeks in Treatment: 12 Diagnosis Coding ICD-10 Codes Code Description M71.22 Synovial cyst of popliteal space [Baker], left knee Methicillin susceptible Staphylococcus aureus infection as the cause of diseases B95.61 classified elsewhere L02.416 Cutaneous abscess of left lower limb Facility Procedures CPT4 Code: [id number] Description: 82505 - WOUND CARE VISIT-LEV 2 EST PT Modifier: Quantity: 1 Physician Procedures CPT4: Description Modifier Quantity Code [id number] 99213 - WC PHYS LEVEL 3 - EST PT 1 ICD-10 Description Diagnosis B95.61 Methicillin susceptible Staphylococcus aureus infection as the cause of diseases classified elsewhere M71.22 Synovial cyst of  popliteal space [Baker], left knee Electronic Signature(s) Signed: 01/22/2017 5:50:35 AM By: Linton Ham MD Entered By: Linton Ham on 01/21/2017 08:53:18

## 2017-02-11 ENCOUNTER — Ambulatory Visit: Payer: Self-pay | Admitting: Internal Medicine

## 2017-02-16 ENCOUNTER — Other Ambulatory Visit: Payer: Self-pay | Admitting: *Deleted

## 2017-02-16 MED ORDER — METOPROLOL TARTRATE 25 MG PO TABS: 12.5000 mg | ORAL_TABLET | Freq: Two times a day (BID) | ORAL | 0 refills | Status: DC

## 2017-03-02 ENCOUNTER — Other Ambulatory Visit: Payer: Self-pay | Admitting: *Deleted

## 2017-03-02 MED ORDER — METOPROLOL TARTRATE 25 MG PO TABS: 12.5000 mg | ORAL_TABLET | Freq: Two times a day (BID) | ORAL | 0 refills | Status: DC

## 2017-04-07 ENCOUNTER — Other Ambulatory Visit: Payer: Self-pay | Admitting: Internal Medicine

## 2017-04-07 ENCOUNTER — Encounter: Payer: Medicare Other | Attending: Internal Medicine | Admitting: Internal Medicine

## 2017-04-07 DIAGNOSIS — M7122 Synovial cyst of popliteal space [Baker], left knee: Secondary | ICD-10-CM | POA: Insufficient documentation

## 2017-04-07 DIAGNOSIS — L02416 Cutaneous abscess of left lower limb: Secondary | ICD-10-CM | POA: Insufficient documentation

## 2017-04-07 DIAGNOSIS — L97928 Non-pressure chronic ulcer of unspecified part of left lower leg with other specified severity: Secondary | ICD-10-CM | POA: Diagnosis not present

## 2017-04-07 DIAGNOSIS — Z96652 Presence of left artificial knee joint: Secondary | ICD-10-CM | POA: Diagnosis not present

## 2017-04-07 DIAGNOSIS — Z792 Long term (current) use of antibiotics: Secondary | ICD-10-CM | POA: Diagnosis not present

## 2017-04-07 DIAGNOSIS — I10 Essential (primary) hypertension: Secondary | ICD-10-CM | POA: Diagnosis not present

## 2017-04-07 DIAGNOSIS — G4730 Sleep apnea, unspecified: Secondary | ICD-10-CM | POA: Diagnosis not present

## 2017-04-07 DIAGNOSIS — M349 Systemic sclerosis, unspecified: Secondary | ICD-10-CM | POA: Insufficient documentation

## 2017-04-07 DIAGNOSIS — Z882 Allergy status to sulfonamides status: Secondary | ICD-10-CM | POA: Insufficient documentation

## 2017-04-07 DIAGNOSIS — B9561 Methicillin susceptible Staphylococcus aureus infection as the cause of diseases classified elsewhere: Secondary | ICD-10-CM | POA: Insufficient documentation

## 2017-04-08 NOTE — Progress Notes (Signed)
[name], [name] ([id number]) Visit Report for 04/07/2017 Chief Complaint Document Details [name], [name]. Date of Service: 04/07/2017 8:00 AM Medical Record Patient Account Number: [id number] Number: Treating RN: Montey Hora 1934/01/20 (83 y.o. Other Clinician: Date of Birth/Sex: Female) Treating Aadam Zhen Primary Care Provider: Loura Pardon Provider/Extender: G Referring Provider: Loura Pardon Weeks in Treatment: 0 Information Obtained from: Patient Chief Complaint 10/28/16; patient is here for review of a draining area in her left popliteal fossa that is been present for 8 days 04/07/17; patient is here for review of a draining area in her left popliteal fossa that we have seen her for previously in this clinic. She also has an excoriated area on the left anterior patella Electronic Signature(s) Signed: 04/07/2017 6:01:27 PM By: Linton Ham MD Entered By: Linton Ham on 04/07/2017 09:01:41 Sleeper, Blima Singer ([id number]) -------------------------------------------------------------------------------- HPI Details [name]. Date of Service: 04/07/2017 8:00 AM Medical Record Patient Account Number: [id number] Number: Treating RN: Montey Hora 02-11-1934 (83 y.o. Other Clinician: Date of Birth/Sex: Female) Treating Erika Slaby Primary Care Provider: Loura Pardon Provider/Extender: G Referring Provider: Loura Pardon Weeks in Treatment: 0 History of Present Illness HPI Description: 10/28/16; this is an 83 year old woman who arrives with a complicated medical issue. 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 Initially in 2000 and apparently most recently in 2014. 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 Indeed she was hospitalized on 02/13/16 and discharged on 02/19/16. She was taken to the OR and it was found that the popliteal abscess was communicating with her  joint space. Signed to feel fluid grew MSSA. Patient was treated with IandD of the abscess. She completed IV cefazolin on 03/28/16 and since then has been on Keflex and rifampin for chronic suppression. Her infectious disease doctor is Dr.Shrestha at [facility]. The patient was seen in our [place] sister clinic through much of October and November 2017. At that point she had a draining sinus that eventually closed over. She noted some pain behind her knee surrounding Christmas day and was seen in our emergency room on 10/23/16. During this ER visit she underwent tendon incision and drainage of a left Baker's cyst and clear colored synovial fluid was obtained. Doxycycline was added for a week to her Keflex and rifampin. A CT scan was ordered but I don't think was ever done due to artifacts from hardware. Her wound culture was negative. She has been using iodoform packing. She states her knee is painful when she stands on it for a period of time. She is not systemically unwell. She has a history of scleroderma or without is been under control for some period of time. 11/04/16 the culture that I did of this area last week showed coag-negative staph and a few enterococcus faecium. Enterococcus is ampicillin resistant. The coag negative staph is only sensitive to vancomycin and tetracycline. Previous joint infection was methicillin sensitive staph aureus. It seems I misunderstood what she said and that her infectious disease clinic appointment is this Friday and the orthopedic appointment was last Friday. I faxed the culture results to the infectious disease clinic but I've given him a copy of it today. Per the patient and her family they orthopedic surgeon did not want to do anything further to this joint. The patient is not systemically unwell in particular no fever or chills and the pain she has is really minimal 11/18/16; this is a patient who has an  infected left knee prosthetic joint. She went to see  infectious disease at [facility] who apparently did did not recommend any further antibiotic therapy. She continues to feel the same episodic pain but no systemic symptoms of particular no fever. The depth of the wound in the popliteal fossa of her left knee is 3.5 versus 5 cm last week 11/25/16 4.5 cm of depth today. Draining exit site. We have treating this with iodoform packing however I really have no expectation of healing and to be truthful the amount of drainage that is coming out of this knee I don't think closure of this site is necessarily considered a positive outcome. Her left knee remains warm but not painful there is an effusion she has no systemic symptoms no fever no chills or and she feels well 12/31/16. Still is same amount of depth today using a skinny at 4.6 cm. There is less drainage. We have been using iodoform packing and I really have no expectation of healing and to be truthful closing this sinus probably is really not the best thing to consider. 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 [name], [name]. ([id number]) She is not felt to be a candidate for a fourth replacement. In the meantime she does not complain of pain except when the dressing is being changed episodically. She has not been systemically unwell no fever or chills or other issues. 01/14/17; after the patient left on 3/7 her daughter tells me the next day the wound had closed over and therefore this is not intact since. Over the last 3 days the patient has noted significant pain in the popliteal fossa of the involved left knee. 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 MSSA was the cause of the original identified joint infection treated at [facility]. 01/21/17; I saw this patient last week at which time there was intense erythema in the popliteal fossa, tenderness and a ballotable fluid collection. I did an IandD on  this. Surprisingly the culture of this was negative although as a result of the IandD her tenderness and erythema in the area is resolved. We continue with the iodoform packing READMISSION 04/07/17; this is a patient I know from 2 prior admissions to this clinic starting in January of this year. Her history is essentially as noted above. 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 She has a draining sinus into the popliteal fossa of the left knee. This remains open and drains. She has been using iodoform packing to this area with gauze over the top. To be truthful I haven't really been certain whether having this area close would be helpful or harmful to this patient. Previously when it's closed she develops a lot more pain in the back of the popliteal fossa and more pain in the knee. She is here today because the packing of the area has become increasingly painful in fact the patient describes this as a "torture chamber" experience. They are packing this wound twice a day. The patient follows with orthopedic surgery and infectious disease at [facility] although she hasn't seen them since she was last here in March. She denies any systemic symptoms including fever chills etc. She states except for the changing of the dressing her pain when she is mobilizing on the knee is 2/10. She also has a area on the left anterior patella in the  incision that is excoriated open. The patient states this is itchy and she does a lot of scratching. They state this was here last time she was here but I have no recollection or documentation of this Electronic Signature(s) Signed: 04/07/2017 6:01:27 PM By: Linton Ham MD Entered By: Linton Ham on 04/07/2017 09:06:04 [name] ([id number]) -------------------------------------------------------------------------------- Physical Exam Details [name]. Date  of Service: 04/07/2017 8:00 AM Medical Record Patient Account Number: [id number] Number: Treating RN: Montey Hora 1933-12-14 (83 y.o. Other Clinician: Date of Birth/Sex: Female) Treating Shenetta Schnackenberg Primary Care Provider: Loura Pardon Provider/Extender: G Referring Provider: Loura Pardon Weeks in Treatment: 0 Constitutional Sitting or standing Blood Pressure is within target range for patient.. Pulse regular and within target range for patient.Marland Kitchen Respirations regular, non-labored and within target range.. Temperature is normal and within the target range for the patient.Marland Kitchen appears in no distress. Eyes Conjunctivae clear. No discharge. Respiratory Respiratory effort is easy and symmetric bilaterally. Rate is normal at rest and on room air.. Bilateral breath sounds are clear and equal in all lobes with no wheezes, rales or rhonchi.. Cardiovascular Heart rhythm and rate regular, without murmur or gallop.. Femoral arteries without bruits and pulses strong.. Pedal pulses are present but faint on the left. Lymphatic None palpable in the popliteal or inguinal area on the left. Musculoskeletal There is still swelling, warmth and probably small effusions in the left knee. There is joint line tenderness but all in all I don't see much of a difference here from the last time she was here. Certainly no worse. Integumentary (Hair, Skin) No rashes. Psychiatric No evidence of depression, anxiety, or agitation. Calm, cooperative, and communicative. Appropriate interactions and affect.. Notes Wound exam; problematic area is in the left popliteal fossa small draining sinus at 2.5 cm. There is scant amount of what looks to be clear synovial fluid. There is nothing that looks purulent no surrounding crepitus or infection. The depth is actually quite a bit less than it has been. oOver the left anterior knee a circular area of excoriated skin and denuded epithelium. This looks  superficial and may indeed be the result of constant scratching Electronic Signature(s) Signed: 04/07/2017 6:01:27 PM By: Linton Ham MD Entered By: Linton Ham on 04/07/2017 09:09:02 [name] ([id number]) -------------------------------------------------------------------------------- Physician Orders Details [name]. Date of Service: 04/07/2017 8:00 AM Medical Record Patient Account Number: [id number] Number: Treating RN: Montey Hora February 16, 1934 (83 y.o. Other Clinician: Date of Birth/Sex: Female) Treating Drury Ardizzone Primary Care Provider: Loura Pardon Provider/Extender: G Referring Provider: Loura Pardon Weeks in Treatment: 0 Verbal / Phone Orders: No Diagnosis Coding Wound Cleansing Wound #2 Left,Posterior Knee o Clean wound with Normal Saline. o May Shower, gently pat wound dry prior to applying new dressing. Wound #3 Left,Anterior Knee o Clean wound with Normal Saline. o May Shower, gently pat wound dry prior to applying new dressing. Anesthetic Wound #2 Left,Posterior Knee o Topical Lidocaine 4% cream applied to wound bed prior to debridement Wound #3 Left,Anterior Knee o Topical Lidocaine 4% cream applied to wound bed prior to debridement Skin Barriers/Peri-Wound Care Wound #2 Left,Posterior Knee o Skin Prep Wound #3 Left,Anterior Knee o Skin Prep Primary Wound Dressing Wound #2 Left,Posterior Knee o Drawtex Wound #3 Left,Anterior Knee o Hydrogel o Prisma Ag Secondary Dressing Wound #2 Left,Posterior Knee o Dry Gauze Brumbaugh, Blima Singer ([id number]) o Boardered Foam Dressing - or telfa island Wound #3 Left,Anterior Knee o Dry Gauze o Boardered Foam Dressing -  or telfa island Dressing Change Frequency Wound #2 Left,Posterior Knee o Change dressing every day. Wound #3 Left,Anterior Knee o Change dressing every day. Follow-up Appointments Wound #2 Left,Posterior Knee o  Return Appointment in 2 weeks. Wound #3 Left,Anterior Knee o Return Appointment in 2 weeks. Additional Orders / Instructions Wound #2 Left,Posterior Knee o Other: - Please add vitamin A, vitamin C and zinc supplements to your diet Wound #3 Left,Anterior Knee o Other: - Please add vitamin A, vitamin C and zinc supplements to your diet Electronic Signature(s) Signed: 04/07/2017 5:15:33 PM By: Montey Hora Signed: 04/07/2017 6:01:27 PM By: Linton Ham MD Entered By: Montey Hora on 04/07/2017 08:56:49 [name] ([id number]) -------------------------------------------------------------------------------- Problem List Details [name]. Date of Service: 04/07/2017 8:00 AM Medical Record Patient Account Number: [id number] Number: Treating RN: Montey Hora 04/22/1934 (83 y.o. 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 Date of Service: 04/07/2017 8:00 AM Medical Record Patient Account Number:  [id number] Number: Treating RN: Montey Hora 1934-02-12 (83 y.o. Other Clinician: Date of Birth/Sex: Female) Treating Stan Cantave Primary Care Provider: Loura Pardon Provider/Extender: G Referring Provider: Loura Pardon Weeks in Treatment: 0 Subjective Chief Complaint Information obtained from Patient 10/28/16; patient is here for review of a draining area in her left popliteal fossa that is been present for 8 days 04/07/17; patient is here for review of a draining area in her left popliteal fossa that we have seen her for previously in this clinic. She also has an excoriated area on the left anterior patella History of Present Illness (HPI) 10/28/16; this is an 83 year old woman who arrives with a complicated medical issue. 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 Initially in 2000 and apparently most recently in 2014. 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 Indeed she was hospitalized on 02/13/16 and discharged on 02/19/16. She was taken to the OR and it was found that the popliteal abscess was communicating with her joint space. Signed to feel fluid grew MSSA. Patient was treated with IandD of the abscess. She completed IV cefazolin on 03/28/16 and since then has been on Keflex and rifampin for chronic suppression. Her infectious disease doctor is Dr.Shrestha at [facility]. The patient was seen in our [place] sister clinic through much of October and November 2017. At that point she had a draining sinus that eventually closed over. She noted some pain behind her knee surrounding Christmas day and was seen in our emergency room on 10/23/16. During this ER visit she underwent tendon incision and drainage of a left Baker's cyst and clear colored synovial fluid was obtained. Doxycycline was added for a week to her Keflex and rifampin. A CT scan was ordered but I don't think was ever done due to artifacts from hardware. Her  wound culture  was negative. She has been using iodoform packing. She states her knee is painful when she stands on it for a period of time. She is not systemically unwell. She has a history of scleroderma or without is been under control for some period of time. 11/04/16 the culture that I did of this area last week showed coag-negative staph and a few enterococcus faecium. Enterococcus is ampicillin resistant. The coag negative staph is only sensitive to vancomycin and tetracycline. Previous joint infection was methicillin sensitive staph aureus. It seems I misunderstood what she said and that her infectious disease clinic appointment is this Friday and the orthopedic appointment was last Friday. I faxed the culture results to the infectious disease clinic but I've given him a copy of it today. Per the patient and her family they orthopedic surgeon did not want to do anything further to this joint. The patient is not systemically unwell in particular no fever or chills and the pain she has is really minimal 11/18/16; this is a patient who has an infected left knee prosthetic joint. She went to see infectious disease at [facility] who apparently did did not recommend any further antibiotic therapy. She continues to feel the same episodic pain but no systemic symptoms of particular no fever. The depth of the wound in the popliteal fossa of her left knee is 3.5 versus 5 cm last week NEVENA, ROZENBERG. ([id number]) 11/25/16 4.5 cm of depth today. Draining exit site. We have treating this with iodoform packing however I really have no expectation of healing and to be truthful the amount of drainage that is coming out of this knee I don't think closure of this site is necessarily considered a positive outcome. Her left knee remains warm but not painful there is an effusion she has no systemic symptoms no fever no chills or and she feels well 12/31/16. Still is same amount of depth today using a skinny at 4.6 cm.  There is less drainage. We have been using iodoform packing and I really have no expectation of healing and to be truthful closing this sinus probably is really not the best thing to consider. 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 She is not felt to be a candidate for a fourth replacement. In the meantime she does not complain of pain except when the dressing is being changed episodically. She has not been systemically unwell no fever or chills or other issues. 01/14/17; after the patient left on 3/7 her daughter tells me the next day the wound had closed over and therefore this is not intact since. Over the last 3 days the patient has noted significant pain in the popliteal fossa of the involved left knee. 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 MSSA was the cause of the original identified joint infection treated at [facility]. 01/21/17; I saw this patient last week at which time there was intense erythema in the popliteal fossa, tenderness and a ballotable fluid collection. I did an IandD on this. Surprisingly the culture of this was negative although as a result of the IandD her tenderness and erythema in the area is resolved. We continue with the iodoform packing READMISSION 04/07/17; this is a patient I know from 2 prior admissions to this clinic starting in January of this year. Her history is essentially as noted above. 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 She has a draining sinus into the popliteal fossa of the left knee. This remains open and drains. She has been using iodoform packing to this area with gauze over the top. To be truthful I haven't really been certain whether having this area close would be helpful or harmful to this patient. Previously when it's closed she develops a lot more pain in the  back of the popliteal fossa and more pain in the knee. She is here today because the packing of the area has become increasingly painful in fact the patient describes this as a "torture chamber" experience. They are packing this wound twice a day. The patient follows with orthopedic surgery and infectious disease at [facility] although she hasn't seen them since she was last here in March. She denies any systemic symptoms including fever chills etc. She states except for the changing of the dressing her pain when she is mobilizing on the knee is 2/10. She also has a area on the left anterior patella in the incision that is excoriated open. The patient states this is itchy and she does a lot of scratching. They state this was here last time she was here but I have no recollection or documentation of this Wound History Patient presents with 1 open wound that has been present for approximately wound did not close. Patient has been treating wound in the following manner: packing with iodoform packing strip. Laboratory tests have not been performed in the last month. Patient reportedly has not tested positive for an antibiotic resistant organism. Patient reportedly has not tested positive for osteomyelitis. Patient reportedly has not had testing performed to evaluate circulation in the legs. Patient experiences the following problems associated with their wounds: swelling. Patient History Information obtained from Patient. HOA, BRIGGS ([id number]) Allergies Zithromax, Sulfa (Sulfonamide Antibiotics), prednisone, adhesive tape Family History Cancer - Mother, Diabetes - Siblings, Father, Heart Disease - Father, Hypertension - Father, Seizures - Child, No family history of Kidney Disease, Lung Disease, Stroke, Thyroid Problems, Tuberculosis. Social History Never smoker, Marital Status - Widowed, Alcohol Use - Rarely, Drug Use - No History, Caffeine Use - Never. Medical History Eyes Patient has  history of Cataracts - Removed Denies history of Glaucoma, Optic Neuritis Ear/Nose/Mouth/Throat Denies history of Chronic sinus problems/congestion, Middle ear problems Hematologic/Lymphatic Denies history of Anemia, Hemophilia, Human Immunodeficiency Virus, Lymphedema Respiratory Patient has history of Sleep Apnea - 2L O2 at night Denies history of Aspiration, Asthma, Chronic Obstructive Pulmonary Disease (COPD), Pneumothorax, Tuberculosis Cardiovascular Patient has history of Arrhythmia, Hypertension Denies history of Angina, Congestive Heart Failure, Coronary Artery Disease, Deep Vein Thrombosis, Hypotension, Myocardial Infarction, Peripheral Arterial Disease, Peripheral Venous Disease, Phlebitis, Vasculitis Gastrointestinal Denies history of Cirrhosis , Colitis, Crohn s, Hepatitis A, Hepatitis B, Hepatitis C Endocrine Denies history of Type I Diabetes, Type II Diabetes Genitourinary Denies history of End Stage Renal Disease Immunological Patient has history of Scleroderma Denies history of Lupus Erythematosus, Raynaud s Integumentary (Skin) Denies history of History of Burn, History of pressure wounds Musculoskeletal Denies history of Gout, Rheumatoid Arthritis, Osteoarthritis, Osteomyelitis Neurologic Denies history of Dementia, Neuropathy, Quadriplegia, Paraplegia, Seizure Disorder Oncologic Denies history of Received Chemotherapy, Received Radiation Psychiatric Denies history of Anorexia/bulimia, Confinement Anxiety Review of Systems (ROS) FARRYN, LINARES ([id number]) Eyes The patient has no complaints or symptoms. Ear/Nose/Mouth/Throat The patient has no complaints or symptoms. Hematologic/Lymphatic The patient has no complaints or symptoms. Respiratory The patient has no complaints or symptoms. Cardiovascular The patient has no complaints or  symptoms. Gastrointestinal The patient has no complaints or symptoms. Endocrine The patient has no complaints or  symptoms. Genitourinary The patient has no complaints or symptoms. Immunological The patient has no complaints or symptoms. Integumentary (Skin) The patient has no complaints or symptoms. Musculoskeletal The patient has no complaints or symptoms. Neurologic The patient has no complaints or symptoms. Oncologic The patient has no complaints or symptoms. Psychiatric The patient has no complaints or symptoms. Objective Constitutional Sitting or standing Blood Pressure is within target range for patient.. Pulse regular and within target range for patient.Marland Kitchen Respirations regular, non-labored and within target range.. Temperature is normal and within the target range for the patient.Marland Kitchen appears in no distress. Vitals Time Taken: 8:15 AM, Height: 66 in, Source: Measured, Weight: 210 lbs, Source: Measured, BMI: 33.9, Temperature: 97.7 F, Pulse: 56 bpm, Respiratory Rate: 18 breaths/min, Blood Pressure: 104/51 mmHg. Eyes Conjunctivae clear. No discharge. Respiratory MONETTE, OMARA ([id number]) Respiratory effort is easy and symmetric bilaterally. Rate is normal at rest and on room air.. Bilateral breath sounds are clear and equal in all lobes with no wheezes, rales or rhonchi.. Cardiovascular Heart rhythm and rate regular, without murmur or gallop.. Femoral arteries without bruits and pulses strong.. Pedal pulses are present but faint on the left. Lymphatic None palpable in the popliteal or inguinal area on the left. Musculoskeletal There is still swelling, warmth and probably small effusions in the left knee. There is joint line tenderness but all in all I don't see much of a difference here from the last time she was here. Certainly no worse. Psychiatric No evidence of depression, anxiety, or agitation. Calm, cooperative, and communicative. Appropriate interactions and affect.. General Notes: Wound exam; problematic area is in the left popliteal fossa small draining sinus at 2.5  cm. There is scant amount of what looks to be clear synovial fluid. There is nothing that looks purulent no surrounding crepitus or infection. The depth is actually quite a bit less than it has been. Over the left anterior knee a circular area of excoriated skin and denuded epithelium. This looks superficial and may indeed be the result of constant scratching Integumentary (Hair, Skin) No rashes. Wound #2 status is Open. Original cause of wound was Not Known. The wound is located on the Left,Posterior Knee. The wound measures 0.3cm length x 0.3cm width x 2.5cm depth; 0.071cm^2 area and 0.177cm^3 volume. There is no tunneling or undermining noted. There is a large amount of serous drainage noted. The wound margin is thickened. There is large (67-100%) red granulation within the wound bed. There is no necrotic tissue within the wound bed. The periwound skin appearance exhibited: Maceration. The periwound skin appearance did not exhibit: Callus, Crepitus, Excoriation, Induration, Rash, Scarring, Dry/Scaly, Atrophie Blanche, Cyanosis, Ecchymosis, Hemosiderin Staining, Mottled, Pallor, Rubor, Erythema. Periwound temperature was noted as No Abnormality. The periwound has tenderness on palpation. Wound #3 status is Open. Original cause of wound was Gradually Appeared. The wound is located on the Left,Anterior Knee. The wound measures 2.5cm length x 2.5cm width x 0.1cm depth; 4.909cm^2 area and 0.491cm^3 volume. There is no tunneling or undermining noted. There is a medium amount of serous drainage noted. The wound margin is flat and intact. There is large (67-100%) red granulation within the wound bed. There is a small (1-33%) amount of necrotic tissue within the wound bed including Eschar. The periwound skin appearance did not exhibit: Callus, Crepitus, Excoriation, Induration, Rash, Scarring, Dry/Scaly, Maceration, Atrophie Blanche, Cyanosis, Ecchymosis, Hemosiderin Staining, Mottled,  Pallor, Rubor, Erythema.  Periwound temperature was noted as No Abnormality. 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 May Shower, gently pat wound dry prior to applying new dressing. Wound #3 Left,Anterior Knee: Clean wound with Normal Saline. May Shower, gently pat wound dry prior to applying new dressing. Anesthetic: Wound #2 Left,Posterior Knee: Topical Lidocaine 4% cream applied to wound bed prior to debridement Wound #3 Left,Anterior Knee: Topical Lidocaine 4% cream applied to wound bed prior to debridement Skin Barriers/Peri-Wound Care: Wound #2 Left,Posterior Knee: Skin Prep Wound #3 Left,Anterior Knee: Skin Prep Primary Wound Dressing: Wound #2 Left,Posterior Knee: Drawtex Wound #3 Left,Anterior Knee: Hydrogel Prisma Ag Secondary Dressing: Wound #2 Left,Posterior Knee: Dry Gauze Boardered Foam Dressing - or telfa island Wound #3 Left,Anterior Knee: Dry Gauze Boardered Foam Dressing - or telfa island Dressing Change Frequency: Wound #2 Left,Posterior Knee: Change dressing every day. MARELYN, ROUSER. ([id number]) Wound #3 Left,Anterior Knee: Change dressing every day. Follow-up Appointments: Wound #2 Left,Posterior Knee: Return Appointment in 2 weeks. Wound #3 Left,Anterior Knee: Return Appointment in 2 weeks. 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 She has been told by orthopedics and I believe infectious disease that if this gets out of control at some point she will need an above knee amputation. #2 the draining site in the left popliteal fossa is certainly down from last time I've seen this patient. I am not exactly certain whether having this area close would be good or bad in terms of the patient's symptoms. Previously when this area closed over she developed a large painful cyst on at least one occasion. Nevertheless I certainly have trouble with the idea that the patient is going to the "torture chamber" twice a day. I therefore elected to see what would happen if we just left this alone from packing with a foam base cover on this change daily. #3 excoriated area on the top of the left patella collagen hydrogel foam #4 the patient is not systemically unwell and at this point I don't think in need of returned to [facility] although that could change and might change quite quickly at some point Electronic Signature(s) Signed: 04/07/2017 6:01:27 PM By: Linton Ham MD Entered By: Linton Ham on 04/07/2017 09:11:05 [name] ([id number]) -------------------------------------------------------------------------------- ROS/PFSH Details [name]. Date of Service: 04/07/2017 8:00 AM Medical Record Patient Account Number: [id number] Number: Treating RN: Montey Hora 04/12/1934 (83 y.o. Other Clinician: Date of Birth/Sex: Female) Treating Rigo Letts Primary Care Provider: Loura Pardon Provider/Extender: G Referring Provider: Loura Pardon Weeks in Treatment: 0 Label Progress Note Print Version as History and Physical for this encounter Information Obtained From Patient Wound History Do you currently have one or more open woundso Yes How many open wounds do you currently haveo 1 Approximately how long have  you had your woundso wound did not close How have you been treating your wound(s) until nowo packing with iodoform  packing strip Has your wound(s) ever healed and then re-openedo No Have you had any lab work done in the past montho No Have you tested positive for an antibiotic resistant organism No (MRSA, VRE)o Have you tested positive for osteomyelitis (bone infection)o No Have you had any tests for circulation on your legso No Have you had other problems associated with your woundso Swelling Eyes Complaints and Symptoms: No Complaints or Symptoms Medical History: Positive for: Cataracts - Removed Negative for: Glaucoma; Optic Neuritis Ear/Nose/Mouth/Throat Complaints and Symptoms: No Complaints or Symptoms Medical History: Negative for: Chronic sinus problems/congestion; Middle ear problems Hematologic/Lymphatic Complaints and Symptoms: No Complaints or Symptoms Medical History: KYANNAH, CLIMER ([id number]) Negative for: Anemia; Hemophilia; Human Immunodeficiency Virus; Lymphedema Respiratory Complaints and Symptoms: No Complaints or Symptoms Medical History: Positive for: Sleep Apnea - 2L O2 at night Negative for: Aspiration; Asthma; Chronic Obstructive Pulmonary Disease (COPD); Pneumothorax; Tuberculosis Cardiovascular Complaints and Symptoms: No Complaints or Symptoms Medical History: Positive for: Arrhythmia; Hypertension Negative for: Angina; Congestive Heart Failure; Coronary Artery Disease; Deep Vein Thrombosis; Hypotension; Myocardial Infarction; Peripheral Arterial Disease; Peripheral Venous Disease; Phlebitis; Vasculitis Gastrointestinal Complaints and Symptoms: No Complaints or Symptoms Medical History: Negative for: Cirrhosis ; Colitis; Crohnos; Hepatitis A; Hepatitis B; Hepatitis C Endocrine Complaints and Symptoms: No Complaints or Symptoms Medical History: Negative for: Type I Diabetes; Type II Diabetes Genitourinary Complaints and Symptoms: No  Complaints or Symptoms Medical History: Negative for: End Stage Renal Disease Immunological Complaints and Symptoms: No Complaints or Symptoms CAMRIN, LAPRE ([id number]) Medical History: Positive for: Scleroderma Negative for: Lupus Erythematosus; Raynaudos Integumentary (Skin) Complaints and Symptoms: No Complaints or Symptoms Medical History: Negative for: History of Burn; History of pressure wounds Musculoskeletal Complaints and Symptoms: No Complaints or Symptoms Medical History: Negative for: Gout; Rheumatoid Arthritis; Osteoarthritis; Osteomyelitis Neurologic Complaints and Symptoms: No Complaints or Symptoms Medical History: Negative for: Dementia; Neuropathy; Quadriplegia; Paraplegia; Seizure Disorder Oncologic Complaints and Symptoms: No Complaints or Symptoms Medical History: Negative for: Received Chemotherapy; Received Radiation Psychiatric Complaints and Symptoms: No Complaints or Symptoms Medical History: Negative for: Anorexia/bulimia; Confinement Anxiety HBO Extended History Items Eyes: Cataracts Immunizations Pneumococcal Vaccine: Received Pneumococcal Vaccination: Yes TOSHIE, DEMELO ([id number]) Immunization Notes: up to date Family and Social History Cancer: Yes - Mother; Diabetes: Yes - Siblings, Father; Heart Disease: Yes - Father; Hypertension: Yes - Father; Kidney Disease: No; Lung Disease: No; Seizures: Yes - Child; Stroke: No; Thyroid Problems: No; Tuberculosis: No; Never smoker; Marital Status - Widowed; Alcohol Use: Rarely; Drug Use: No History; Caffeine Use: Never; Financial Concerns: No; Food, Clothing or Shelter Needs: No; Support System Lacking: No; Transportation Concerns: No; Advanced Directives: Yes (Not Provided); Patient does not want information on Advanced Directives; Do not resuscitate: No; Living Will: Yes (Not Provided); Medical Power of Attorney: Yes - Santiago Glad (Not Provided) Electronic Signature(s) Signed: 04/07/2017 5:15:33  PM By: Montey Hora Signed: 04/07/2017 6:01:27 PM By: Linton Ham MD Entered By: Montey Hora on 04/07/2017 08:22:15 [name] ([id number]) -------------------------------------------------------------------------------- SuperBill Details [name]. Date of Service: 04/07/2017 Medical Record Patient Account Number: [id number] Number: Treating RN: Montey Hora [birth year] (83 y.o. Other Clinician: Date of Birth/Sex: Female) Treating Wynona Duhamel Primary Care Provider: Loura Pardon Provider/Extender: G Referring Provider: Loura Pardon Weeks in Treatment: 0 Diagnosis Coding ICD-10 Codes Code Description M71.22 Synovial cyst of popliteal space [Baker], left knee Methicillin susceptible Staphylococcus aureus infection as the cause of diseases B95.61 classified elsewhere L02.416 Cutaneous abscess of left lower limb Non-pressure chronic ulcer of  unspecified part of left lower leg with other specified L97.928 severity Facility Procedures CPT4 Code: [id number] Description: 99214 - WOUND CARE VISIT-LEV 4 EST PT Modifier: Quantity: 1 Physician Procedures CPT4: Description Modifier Quantity Code [id number] 99214 - WC PHYS LEVEL 4 - EST PT 1 ICD-10 Description Diagnosis M71.22 Synovial cyst of popliteal space [Baker], left knee B95.61 Methicillin susceptible Staphylococcus aureus infection as the cause of  diseases classified elsewhere L02.416 Cutaneous abscess of left lower limb Electronic Signature(s) Signed: 04/07/2017 6:01:27 PM By: Linton Ham MD Entered By: Linton Ham on 04/07/2017 09:11:31

## 2017-04-08 NOTE — Progress Notes (Signed)
SHATIMA, ZALAR ([id number]) Visit Report for 04/07/2017 Abuse/Suicide Risk Screen Details [name], [name]. Date of Service: 04/07/2017 8:00 AM Medical Record Patient Account Number: [id number] Number: Treating RN: Montey Hora 1934-09-20 (83 y.o. Other Clinician: Date of Birth/Sex: Female) Treating ROBSON, MICHAEL Primary Care Quindarius Cabello: Loura Pardon Adesuwa Osgood/Extender: G Referring Herta Hink: Loura Pardon Weeks in Treatment: 0 Abuse/Suicide Risk Screen Items Answer ABUSE/SUICIDE RISK SCREEN: Has anyone close to you tried to hurt or harm you recentlyo No Do you feel uncomfortable with anyone in your familyo No Has anyone forced you do things that you didnot want to doo No Do you have any thoughts of harming yourselfo No Patient displays signs or symptoms of abuse and/or neglect. No Electronic Signature(s) Signed: 04/07/2017 5:15:33 PM By: Montey Hora Entered By: Montey Hora on 04/07/2017 08:13:27 [name] ([id number]) -------------------------------------------------------------------------------- Activities of Daily Living Details [name]. Date of Service: 04/07/2017 8:00 AM Medical Record Patient Account Number: [id number] Number: Treating RN: Montey Hora [birth year] (83 y.o. Other Clinician: Date of Birth/Sex: Female) Treating ROBSON, MICHAEL Primary Care Tracyann Duffell: Loura Pardon Shelita Steptoe/Extender: G Referring Joua Bake: Loura Pardon Weeks in Treatment: 0 Activities of Daily Living Items Answer Activities of Daily Living (Please select one for each item) Drive Automobile Need Assistance Take Medications Completely Able Use Telephone Completely Able Care for Appearance Completely Able Use Toilet Completely Able Bath / Shower Completely Able Dress Self Completely Able Feed Self Completely Able Walk Completely Able Get In / Out Bed Completely Able Housework Completely Able Prepare Meals Completely  [place] for Self Need Assistance Electronic Signature(s) Signed: 04/07/2017 5:15:33 PM By: Montey Hora Entered By: Montey Hora on 04/07/2017 08:13:53 [name] ([id number]) -------------------------------------------------------------------------------- Education Assessment Details [name]. Date of Service: 04/07/2017 8:00 AM Medical Record Patient Account Number: [id number] Number: Treating RN: Montey Hora 06-26-1934 (83 y.o. Other Clinician: Date of Birth/Sex: Female) Treating ROBSON, MICHAEL Primary Care Special Ranes: Loura Pardon Griffen Frayne/Extender: G Referring Smera Guyette: Ayesha Mohair in Treatment: 0 Primary Learner Assessed: Caregiver Reason Patient is not Primary Learner: wound location Learning Preferences/Education Level/Primary Language Learning Preference: Explanation, Demonstration Highest Education Level: College or Above Preferred Language: English Cognitive Barrier Assessment/Beliefs Language Barrier: No Translator Needed: No Memory Deficit: No Emotional Barrier: No Cultural/Religious Beliefs Affecting Medical No Care: Physical Barrier Assessment Impaired Vision: No Impaired Hearing: No Decreased Hand dexterity: No Knowledge/Comprehension Assessment Knowledge Level: Medium Comprehension Level: Medium Ability to understand written Medium instructions: Ability to understand verbal Medium instructions: Motivation Assessment Anxiety Level: Calm Cooperation: Cooperative Education Importance: Acknowledges Need Interest in Health Problems: Asks Questions Perception: Coherent Willingness to Engage in Self- Medium Management Activities: Medium JAGGER, BEAHM ([id number]) Readiness to Engage in Self- Management Activities: Electronic Signature(s) Signed: 04/07/2017 5:15:33 PM By: Montey Hora Entered By: Montey Hora on 04/07/2017 08:14:24 [name]  ([id number]) -------------------------------------------------------------------------------- Fall Risk Assessment Details [name]. Date of Service: 04/07/2017 8:00 AM Medical Record Patient Account Number: [id number] Number: Treating RN: Montey Hora 1934/02/15 (83 y.o. Other Clinician: Date of Birth/Sex: Female) Treating ROBSON, MICHAEL Primary Care Mahdiya Mossberg: Loura Pardon Amrit Cress/Extender: G Referring Pedrohenrique Mcconville: Loura Pardon Weeks in Treatment: 0 Fall Risk Assessment Items Have you had 2 or more falls in the last 12 monthso 0 No Have you had any fall that resulted in injury in the last 12 monthso 0 No FALL RISK ASSESSMENT: History of falling - immediate or within 3 months 0 No Secondary diagnosis  0 No Ambulatory aid None/bed rest/wheelchair/nurse 0 No Crutches/cane/walker 15 Yes Furniture 0 No IV Access/Saline Lock 0 No Gait/Training Normal/bed rest/immobile 0 No Weak 10 Yes Impaired 0 No Mental Status Oriented to own ability 0 Yes Electronic Signature(s) Signed: 04/07/2017 5:15:33 PM By: Montey Hora Entered By: Montey Hora on 04/07/2017 08:14:36 Maute, Blima Singer ([id number]) -------------------------------------------------------------------------------- Foot Assessment Details [name]. Date of Service: 04/07/2017 8:00 AM Medical Record Patient Account Number: [id number] Number: Treating RN: Montey Hora 05-19-1934 (83 y.o. Other Clinician: Date of Birth/Sex: Female) Treating ROBSON, MICHAEL Primary Care Kirston Luty: Loura Pardon Lidie Glade/Extender: G Referring Taren Dymek: Loura Pardon Weeks in Treatment: 0 Foot Assessment Items Site Locations + = Sensation present, - = Sensation absent, C = Callus, U = Ulcer R = Redness, W = Warmth, M = Maceration, PU = Pre-ulcerative lesion F = Fissure, S = Swelling, D = Dryness Assessment Right: Left: Other Deformity: No No Prior Foot Ulcer: No No Prior  Amputation: No No Charcot Joint: No No Ambulatory Status: Ambulatory With Help Assistance Device: Cane Gait: Steady Electronic Signature(s) Signed: 04/07/2017 8:38:53 AM By: Montey Hora Entered By: Montey Hora on 04/07/2017 08:38:53 [name] ([id number]) Rogers Blocker, Blima Singer ([id number]) -------------------------------------------------------------------------------- Nutrition Risk Assessment Details [name]. Date of Service: 04/07/2017 8:00 AM Medical Record Patient Account Number: [id number] Number: Treating RN: Montey Hora 23-Apr-1934 (83 y.o. Other Clinician: Date of Birth/Sex: Female) Treating ROBSON, MICHAEL Primary Care Rilyn Upshaw: Loura Pardon Sinead Hockman/Extender: G Referring Kymani Laursen: Loura Pardon Weeks in Treatment: 0 Height (in): 66 Weight (lbs): 209 Body Mass Index (BMI): 33.7 Nutrition Risk Assessment Items NUTRITION RISK SCREEN: I have an illness or condition that made me change the kind and/or 0 No amount of food I eat I eat fewer than two meals per day 0 No I eat few fruits and vegetables, or milk products 0 No I have three or more drinks of beer, liquor or wine almost every day 0 No I have tooth or mouth problems that make it hard for me to eat 0 No I don't always have enough money to buy the food I need 0 No I eat alone most of the time 0 No I take three or more different prescribed or over-the-counter drugs a 1 Yes day Without wanting to, I have lost or gained 10 pounds in the last six 0 No months I am not always physically able to shop, cook and/or feed myself 0 No Nutrition Protocols Good Risk Protocol 0 No interventions needed Moderate Risk Protocol Electronic Signature(s) Signed: 04/07/2017 5:15:33 PM By: Montey Hora Entered By: Montey Hora on 04/07/2017 08:14:43

## 2017-04-08 NOTE — Progress Notes (Signed)
[name] ([id number]) Visit Report for 04/07/2017 Allergy List Details [name]. Date of Service: 04/07/2017 8:00 AM Medical Record Patient Account Number: [id number] Number: Treating RN: Montey Hora 1934/03/24 (83 y.o. Other Clinician: Date of Birth/Sex: Female) Treating ROBSON, MICHAEL Primary Care Camdon Saetern: Loura Pardon Aarit Kashuba/Extender: G Referring Kamesha Herne: Loura Pardon Weeks in Treatment: 0 Allergies Active Allergies Zithromax Sulfa (Sulfonamide Antibiotics) prednisone adhesive tape Allergy Notes Electronic Signature(s) Signed: 04/07/2017 5:15:33 PM By: Montey Hora Entered By: Montey Hora on 04/07/2017 08:13:00 [name] ([id number]) -------------------------------------------------------------------------------- Arrival Information Details [name]. Date of Service: 04/07/2017 8:00 AM Medical Record Patient Account Number: [id number] Number: Treating RN: Montey Hora 29-Feb-1934 (83 y.o. Other Clinician: Date of Birth/Sex: Female) Treating ROBSON, MICHAEL Primary Care Christal Lagerstrom: Loura Pardon Haily Caley/Extender: G Referring Jalynn Betzold: Loura Pardon Weeks in Treatment: 0 Visit Information Patient Arrived: Cane Arrival Time: 08:11 Accompanied By: dtr Transfer Assistance: None Patient Identification Verified: Yes Secondary Verification Process Completed: Yes History Since Last Visit Added or deleted any medications: No Any new allergies or adverse reactions: No Had a fall or experienced change in activities of daily living that may affect risk of falls: No Signs or symptoms of abuse/neglect since last visito No Hospitalized since last visit: No Electronic Signature(s) Signed: 04/07/2017 5:15:33 PM By: Montey Hora Entered By: Montey Hora on 04/07/2017 08:11:46 [name] ([id number]) -------------------------------------------------------------------------------- Clinic Level  of Care Assessment Details [name]. Date of Service: 04/07/2017 8:00 AM Medical Record Patient Account Number: [id number] Number: Treating RN: Montey Hora April 18, 1934 (83 y.o. Other Clinician: Date of Birth/Sex: Female) Treating ROBSON, MICHAEL Primary Care Chanceler Pullin: Loura Pardon Dominie Benedick/Extender: G Referring Dannis Deroche: Loura Pardon Weeks in Treatment: 0 Clinic Level of Care Assessment Items TOOL 2 Quantity Score []  - Use when only an EandM is performed on the INITIAL visit 0 ASSESSMENTS - Nursing Assessment / Reassessment X - General Physical Exam (combine w/ comprehensive assessment (listed just 1 20 below) when performed on new pt. evals) X - Comprehensive Assessment (HX, ROS, Risk Assessments, Wounds Hx, etc.) 1 25 ASSESSMENTS - Wound and Skin Assessment / Reassessment []  - Simple Wound Assessment / Reassessment - one wound 0 X - Complex Wound Assessment / Reassessment - multiple wounds 2 5 []  - Dermatologic / Skin Assessment (not related to wound area) 0 ASSESSMENTS - Ostomy and/or Continence Assessment and Care []  - Incontinence Assessment and Management 0 []  - Ostomy Care Assessment and Management (repouching, etc.) 0 PROCESS - Coordination of Care X - Simple Patient / Family Education for ongoing care 1 15 []  - Complex (extensive) Patient / Family Education for ongoing care 0 []  - Staff obtains Programmer, systems, Records, Test Results / Process Orders 0 []  - Staff telephones HHA, Nursing Homes / Clarify orders / etc 0 []  - Routine Transfer to another Facility (non-emergent condition) 0 []  - Routine Hospital Admission (non-emergent condition) 0 X - New Admissions / Biomedical engineer / Ordering NPWT, Apligraf, etc. 1 15 []  - Emergency Hospital Admission (emergent condition) 0 [name], [name] ([id number]) X - Simple Discharge Coordination 1 10 []  - Complex (extensive) Discharge Coordination 0 PROCESS - Special Needs []  - Pediatric / Minor  Patient Management 0 []  - Isolation Patient Management 0 []  - Hearing / Language / Visual special needs 0 []  - Assessment of Community assistance (transportation, D/C planning, etc.) 0 []  - Additional assistance / Altered mentation 0 []  - Support Surface(s) Assessment (bed, cushion, seat, etc.) 0 INTERVENTIONS -  Wound Cleansing / Measurement X - Wound Imaging (photographs - any number of wounds) 1 5 []  - Wound Tracing (instead of photographs) 0 []  - Simple Wound Measurement - one wound 0 X - Complex Wound Measurement - multiple wounds 2 5 []  - Simple Wound Cleansing - one wound 0 X - Complex Wound Cleansing - multiple wounds 2 5 INTERVENTIONS - Wound Dressings X - Small Wound Dressing one or multiple wounds 2 10 []  - Medium Wound Dressing one or multiple wounds 0 []  - Large Wound Dressing one or multiple wounds 0 []  - Application of Medications - injection 0 INTERVENTIONS - Miscellaneous []  - External ear exam 0 []  - Specimen Collection (cultures, biopsies, blood, body fluids, etc.) 0 []  - Specimen(s) / Culture(s) sent or taken to Lab for analysis 0 []  - Patient Transfer (multiple staff / Harrel Lemon Lift / Similar devices) 0 []  - Simple Staple / Suture removal (25 or less) 0 [name], [name] ([id number]) []  - Complex Staple / Suture removal (26 or more) 0 []  - Hypo / Hyperglycemic Management (close monitor of Blood Glucose) 0 []  - Ankle / Brachial Index (ABI) - do not check if billed separately 0 Has the patient been seen at the hospital within the last three years: Yes Total Score: 140 Level Of Care: New/Established - Level 4 Electronic Signature(s) Signed: 04/07/2017 5:15:33 PM By: Montey Hora Entered By: Montey Hora on 04/07/2017 08:48:23 [name] ([id number]) -------------------------------------------------------------------------------- Encounter Discharge Information Details [name]. Date of Service: 04/07/2017 8:00 AM Medical Record Patient  Account Number: [id number] Number: Treating RN: Montey Hora 1934-03-08 (83 y.o. Other Clinician: Date of Birth/Sex: Female) Treating ROBSON, MICHAEL Primary Care Carlos Quackenbush: Loura Pardon Takiah Maiden/Extender: G Referring Miley Blanchett: Loura Pardon Weeks in Treatment: 0 Encounter Discharge Information Items Discharge Pain Level: 0 Discharge Condition: Stable Ambulatory Status: Cane Discharge Destination: Home Transportation: Private Auto Accompanied By: dtr Schedule Follow-up Appointment: Yes Medication Reconciliation completed No and provided to Patient/Care Daxx Tiggs: Provided on Clinical Summary of Care: 04/07/2017 Form Type Recipient Paper Patient FW Electronic Signature(s) Signed: 04/07/2017 9:09:28 AM By: Ruthine Dose Previous Signature: 04/07/2017 8:40:18 AM Version By: Montey Hora Entered By: Ruthine Dose on 04/07/2017 09:09:28 [name] ([id number]) -------------------------------------------------------------------------------- Lower Extremity Assessment Details [name]. Date of Service: 04/07/2017 8:00 AM Medical Record Patient Account Number: [id number] Number: Treating RN: Montey Hora 1934/10/19 (83 y.o. Other Clinician: Date of Birth/Sex: Female) Treating ROBSON, MICHAEL Primary Care Kelse Ploch: Loura Pardon Stiven Kaspar/Extender: G Referring Vania Rosero: Loura Pardon Weeks in Treatment: 0 Edema Assessment Assessed: [Left: No] [Right: No] Edema: [Left: N] [Right: o] Vascular Assessment Pulses: Dorsalis Pedis Palpable: [Left:Yes] Posterior Tibial Palpable: [Left:Yes] Extremity colors, hair growth, and conditions: Extremity Color: [Left:Hyperpigmented] Hair Growth on Extremity: [Left:Yes] Temperature of Extremity: [Left:Warm] Capillary Refill: [Left:< 3 seconds] Electronic Signature(s) Signed: 04/07/2017 8:38:21 AM By: Montey Hora Entered By: Montey Hora on 04/07/2017 08:38:21 [name]  ([id number]) -------------------------------------------------------------------------------- Multi Wound Chart Details [name]. Date of Service: 04/07/2017 8:00 AM Medical Record Patient Account Number: [id number] Number: Treating RN: Montey Hora 15-Aug-1934 (83 y.o. Other Clinician: Date of Birth/Sex: Female) Treating ROBSON, MICHAEL Primary Care Kieran Arreguin: Loura Pardon Kennan Detter/Extender: G Referring Alijah Hyde: Loura Pardon Weeks in Treatment: 0 Vital Signs Height(in): 66 Pulse(bpm): 56 Weight(lbs): 210 Blood Pressure 104/51 (mmHg): Body Mass Index(BMI): 34 Temperature(F): 97.7 Respiratory Rate 18 (breaths/min): Photos: [2:No Photos] [3:No Photos] [N/A:N/A] Wound Location: [2:Left Knee - Posterior] [3:Left Knee - Anterior] [N/A:N/A] Wounding Event: [  2:Not Known] [3:Gradually Appeared] [N/A:N/A] Primary Etiology: [2:Cyst] [3:Open Surgical Wound] [N/A:N/A] Comorbid History: [2:Cataracts, Sleep Apnea, Cataracts, Sleep Apnea, N/A Arrhythmia, Hypertension, Arrhythmia, Hypertension, Scleroderma] [3:Scleroderma] Date Acquired: [2:11/03/2016] [3:03/30/2017] [N/A:N/A] Weeks of Treatment: [2:0] [3:0] [N/A:N/A] Wound Status: [2:Open] [3:Open] [N/A:N/A] Pending Amputation on Yes [3:Yes] [N/A:N/A] Presentation: Measurements L x W x D 0.3x0.3x2.5 [3:2.5x2.5x0.1] [N/A:N/A] (cm) Area (cm) : [2:0.071] [3:4.909] [N/A:N/A] Volume (cm) : [2:0.177] [3:0.491] [N/A:N/A] Classification: [2:Full Thickness Without Exposed Support Structures] [3:Partial Thickness] [N/A:N/A] Exudate Amount: [2:Large] [3:Medium] [N/A:N/A] Exudate Type: [2:Serous] [3:Serous] [N/A:N/A] Exudate Color: [2:amber] [3:amber] [N/A:N/A] Wound Margin: [2:Thickened] [3:Flat and Intact] [N/A:N/A] Granulation Amount: [2:Large (67-100%)] [3:Large (67-100%)] [N/A:N/A] Granulation Quality: [2:Red] [3:Red] [N/A:N/A] Necrotic Amount: [2:None Present (0%)] [3:Small (1-33%)] [N/A:N/A] Necrotic Tissue:  [2:N/A] [3:Eschar] [N/A:N/A] Exposed Structures: [N/A:N/A] Fascia: No Fascia: No Fat Layer (Subcutaneous Fat Layer (Subcutaneous Tissue) Exposed: No Tissue) Exposed: No Tendon: No Tendon: No Muscle: No Muscle: No Joint: No Joint: No Bone: No Bone: No Epithelialization: None Medium (34-66%) N/A Periwound Skin Texture: Excoriation: No Excoriation: No N/A Induration: No Induration: No Callus: No Callus: No Crepitus: No Crepitus: No Rash: No Rash: No Scarring: No Scarring: No Periwound Skin Maceration: Yes Maceration: No N/A Moisture: Dry/Scaly: No Dry/Scaly: No Periwound Skin Color: Atrophie Blanche: No Atrophie Blanche: No N/A Cyanosis: No Cyanosis: No Ecchymosis: No Ecchymosis: No Erythema: No Erythema: No Hemosiderin Staining: No Hemosiderin Staining: No Mottled: No Mottled: No Pallor: No Pallor: No Rubor: No Rubor: No Temperature: No Abnormality No Abnormality N/A Tenderness on Yes No N/A Palpation: Wound Preparation: Ulcer Cleansing: Ulcer Cleansing: N/A Rinsed/Irrigated with Rinsed/Irrigated with Saline Saline Topical Anesthetic Topical Anesthetic Applied: Other: lidocaine Applied: Other: lidocaine 4% 4% Treatment Notes Electronic Signature(s) Signed: 04/07/2017 6:01:27 PM By: Linton Ham MD Previous Signature: 04/07/2017 8:39:48 AM Version By: Montey Hora Entered By: Linton Ham on 04/07/2017 09:00:49 [name] ([id number]) -------------------------------------------------------------------------------- [place] Details Patient Name: KORTLYN, KOLTZ. Date of Service: 04/07/2017 8:00 AM Medical Record Patient Account Number: [id number] Number: Treating RN: Montey Hora [birth year] (83 y.o. Other Clinician: Date of Birth/Sex: Female) Treating ROBSON, MICHAEL Primary Care Shatana Saxton: Loura Pardon Makaylia Hewett/Extender: G Referring Nelma Phagan: Loura Pardon Weeks in Treatment: 0 Active Inactive ` Abuse /  Safety / Falls / Self Care Management Nursing Diagnoses: Potential for falls Goals: Patient will remain injury free related to falls Date Initiated: 04/07/2017 Target Resolution Date: 06/19/2017 Goal Status: Active Interventions: Assess fall risk on admission and as needed Notes: ` Orientation to the Wound Care Program Nursing Diagnoses: Knowledge deficit related to the wound healing center program Goals: Patient/caregiver will verbalize understanding of the [place] Date Initiated: 04/07/2017 Target Resolution Date: 06/19/2017 Goal Status: Active Interventions: Provide education on orientation to the wound center Notes: ` Wound/Skin Impairment Nursing Diagnoses: ALIESE, BRANNUM ([id number]) Impaired tissue integrity Goals: Patient/caregiver will verbalize understanding of skin care regimen Date Initiated: 04/07/2017 Target Resolution Date: 06/19/2017 Goal Status: Active Ulcer/skin breakdown will have a volume reduction of 30% by week 4 Date Initiated: 04/07/2017 Target Resolution Date: 06/19/2017 Goal Status: Active Ulcer/skin breakdown will have a volume reduction of 50% by week 8 Date Initiated: 04/07/2017 Target Resolution Date: 06/19/2017 Goal Status: Active Ulcer/skin breakdown will have a volume reduction of 80% by week 12 Date Initiated: 04/07/2017 Target Resolution Date: 06/19/2017 Goal Status: Active Ulcer/skin breakdown will heal within 14 weeks Date Initiated: 04/07/2017 Target Resolution Date: 06/19/2017 Goal Status: Active Interventions: Assess patient/caregiver ability to obtain necessary supplies Assess patient/caregiver ability to perform ulcer/skin care regimen  upon admission and as needed Assess ulceration(s) every visit Notes: Electronic Signature(s) Signed: 04/07/2017 5:15:33 PM By: Montey Hora Entered By: Montey Hora on 04/07/2017 08:43:18 Soo, Blima Singer  ([id number]) -------------------------------------------------------------------------------- Pain Assessment Details [name]. Date of Service: 04/07/2017 8:00 AM Medical Record Patient Account Number: [id number] Number: Treating RN: Montey Hora 1934/04/16 (83 y.o. Other Clinician: Date of Birth/Sex: Female) Treating ROBSON, MICHAEL Primary Care Elcie Pelster: Loura Pardon Kallie Depolo/Extender: G Referring Shelia Magallon: Loura Pardon Weeks in Treatment: 0 Active Problems Location of Pain Severity and Description of Pain Patient Has Paino Yes Site Locations Pain Location: Pain in Ulcers With Dressing Change: No Pain Management and Medication Current Pain Management: Notes pain only on rising from chair - Topical or injectable lidocaine is offered to patient for acute pain when surgical debridement is performed. If needed, Patient is instructed to use over the counter pain medication for the following 24-48 hours after debridement. Wound care MDs do not prescribed pain medications. Patient has chronic pain or uncontrolled pain. Patient has been instructed to make an appointment with their Primary Care Physician for pain management. Electronic Signature(s) Signed: 04/07/2017 5:15:33 PM By: Montey Hora Entered By: Montey Hora on 04/07/2017 08:12:17 [name] ([id number]) -------------------------------------------------------------------------------- Patient/Caregiver Education Details [name]. Date of Service: 04/07/2017 8:00 AM Medical Record Patient Account Number: [id number] Number: Treating RN: Montey Hora 1934/07/15 (83 y.o. Other Clinician: Date of Birth/Gender: Female) Treating ROBSON, MICHAEL Primary Care Physician: Loura Pardon Physician/Extender: G Referring Physician: Ayesha Mohair in Treatment: 0 Education Assessment Education Provided To: Patient and Caregiver Education Topics  Provided Wound/Skin Impairment: Handouts: Other: wound care as ordered Methods: Demonstration, Explain/Verbal Responses: State content correctly Electronic Signature(s) Signed: 04/07/2017 5:15:33 PM By: Montey Hora Entered By: Montey Hora on 04/07/2017 08:40:34 Rotter, Blima Singer ([id number]) -------------------------------------------------------------------------------- Wound Assessment Details [name]. Date of Service: 04/07/2017 8:00 AM Medical Record Patient Account Number: [id number] Number: Treating RN: Montey Hora May 24, 1934 (83 y.o. Other Clinician: Date of Birth/Sex: Female) Treating ROBSON, MICHAEL Primary Care Secret Kristensen: Loura Pardon Jhalil Silvera/Extender: G Referring Sayed Apostol: Loura Pardon Weeks in Treatment: 0 Wound Status Wound Number: 2 Primary Cyst Etiology: Wound Location: Left Knee - Posterior Wound Open Wounding Event: Not Known Status: Date Acquired: 11/03/2016 Comorbid Cataracts, Sleep Apnea, Arrhythmia, Weeks Of Treatment: 0 History: Hypertension, Scleroderma Clustered Wound: No Photos Photo Uploaded By: Montey Hora on 04/07/2017 10:14:22 Wound Measurements Length: (cm) 0.3 Width: (cm) 0.3 Depth: (cm) 2.5 Area: (cm) 0.071 Volume: (cm) 0.177 % Reduction in Area: % Reduction in Volume: Epithelialization: None Tunneling: No Undermining: No Wound Description Full Thickness Without Exposed Classification: Support Structures Wound Margin: Thickened Exudate Large Amount: Exudate Type: Serous Exudate Color: amber Foul Odor After Cleansing: No Slough/Fibrino No Wound Bed Granulation Amount: Large (67-100%) Exposed Structure ARVIS, MIGUEZ ([id number]) Granulation Quality: Red Fascia Exposed: No Necrotic Amount: None Present (0%) Fat Layer (Subcutaneous Tissue) Exposed: No Tendon Exposed: No Muscle Exposed: No Joint Exposed: No Bone Exposed: No Periwound Skin Texture Texture Color No Abnormalities  Noted: No No Abnormalities Noted: No Callus: No Atrophie Blanche: No Crepitus: No Cyanosis: No Excoriation: No Ecchymosis: No Induration: No Erythema: No Rash: No Hemosiderin Staining: No Scarring: No Mottled: No Pallor: No Moisture Rubor: No No Abnormalities Noted: No Dry / Scaly: No Temperature / Pain Maceration: Yes Temperature: No Abnormality Tenderness on Palpation: Yes Wound Preparation Ulcer Cleansing: Rinsed/Irrigated with Saline Topical Anesthetic Applied: Other: lidocaine 4%, Electronic Signature(s) Signed: 04/07/2017 5:15:33 PM By: Marjory Lies,  Di Kindle Entered By: Montey Hora on 04/07/2017 08:33:25 [name] ([id number]) -------------------------------------------------------------------------------- Wound Assessment Details [name]. Date of Service: 04/07/2017 8:00 AM Medical Record Patient Account Number: [id number] Number: Treating RN: Montey Hora 07/16/1934 (83 y.o. Other Clinician: Date of Birth/Sex: Female) Treating ROBSON, MICHAEL Primary Care Ainsleigh Kakos: Loura Pardon Vessie Olmsted/Extender: G Referring Jahniya Duzan: Loura Pardon Weeks in Treatment: 0 Wound Status Wound Number: 3 Primary Open Surgical Wound Etiology: Wound Location: Left Knee - Anterior Wound Open Wounding Event: Gradually Appeared Status: Date Acquired: 03/30/2017 Comorbid Cataracts, Sleep Apnea, Arrhythmia, Weeks Of Treatment: 0 History: Hypertension, Scleroderma Clustered Wound: No Photos Photo Uploaded By: Montey Hora on 04/07/2017 10:14:40 Wound Measurements Length: (cm) 2.5 Width: (cm) 2.5 Depth: (cm) 0.1 Area: (cm) 4.909 Volume: (cm) 0.491 % Reduction in Area: % Reduction in Volume: Epithelialization: Medium (34-66%) Tunneling: No Undermining: No Wound Description Classification: Partial Thickness Wound Margin: Flat and Intact Exudate Amount: Medium Exudate Type: Serous Exudate Color: amber Foul Odor After Cleansing:  No Slough/Fibrino No Wound Bed Granulation Amount: Large (67-100%) Exposed Structure Granulation Quality: Red Fascia Exposed: No Necrotic Amount: Small (1-33%) Fat Layer (Subcutaneous Tissue) Exposed: No Ripp, Blima Singer ([id number]) Necrotic Quality: Eschar Tendon Exposed: No Muscle Exposed: No Joint Exposed: No Bone Exposed: No Periwound Skin Texture Texture Color No Abnormalities Noted: No No Abnormalities Noted: No Callus: No Atrophie Blanche: No Crepitus: No Cyanosis: No Excoriation: No Ecchymosis: No Induration: No Erythema: No Rash: No Hemosiderin Staining: No Scarring: No Mottled: No Pallor: No Moisture Rubor: No No Abnormalities Noted: No Dry / Scaly: No Temperature / Pain Maceration: No Temperature: No Abnormality Wound Preparation Ulcer Cleansing: Rinsed/Irrigated with Saline Topical Anesthetic Applied: Other: lidocaine 4%, Electronic Signature(s) Signed: 04/07/2017 5:15:33 PM By: Montey Hora Entered By: Montey Hora on 04/07/2017 08:35:25 [name] ([id number]) -------------------------------------------------------------------------------- Vitals Details [name]. Date of Service: 04/07/2017 8:00 AM Medical Record Patient Account Number: [id number] Number: Treating RN: Montey Hora 21-Aug-1934 (83 y.o. Other Clinician: Date of Birth/Sex: Female) Treating ROBSON, MICHAEL Primary Care Fannie Gathright: Loura Pardon Kyarah Enamorado/Extender: G Referring Tou Hayner: Loura Pardon Weeks in Treatment: 0 Vital Signs Time Taken: 08:15 Temperature (F): 97.7 Height (in): 66 Pulse (bpm): 56 Source: Measured Respiratory Rate (breaths/min): 18 Weight (lbs): 210 Blood Pressure (mmHg): 104/51 Source: Measured Reference Range: 80 - 120 mg / dl Body Mass Index (BMI): 33.9 Electronic Signature(s) Signed: 04/07/2017 5:15:33 PM By: Montey Hora Entered By: Montey Hora on 04/07/2017 08:19:52

## 2017-04-09 ENCOUNTER — Telehealth: Payer: Self-pay | Admitting: Internal Medicine

## 2017-04-10 ENCOUNTER — Other Ambulatory Visit: Payer: Self-pay | Admitting: Family Medicine

## 2017-04-21 ENCOUNTER — Encounter: Payer: Medicare Other | Admitting: Internal Medicine

## 2017-04-21 DIAGNOSIS — M7122 Synovial cyst of popliteal space [Baker], left knee: Secondary | ICD-10-CM | POA: Diagnosis not present

## 2017-04-22 NOTE — Progress Notes (Addendum)
[name], [name] ([id number]) Visit Report for 04/21/2017 Arrival Information Details [name], [name]. Date of Service: 04/21/2017 11:00 AM Medical Record Patient Account Number: [id number] Number: Treating RN: Montey Hora [birth year] (83 y.o. Other Clinician: Date of Birth/Sex: Female) Treating ROBSON, MICHAEL Primary Care Nevaeh Korte: Loura Pardon Lailee Hoelzel/Extender: G Referring Krishan Mcbreen: Loura Pardon Weeks in Treatment: 2 Visit Information History Since Last Visit Added or deleted any medications: No Patient Arrived: Wheel Chair Any new allergies or adverse reactions: No Arrival Time: 11:13 Had a fall or experienced change in No activities of daily living that may affect Accompanied By: dtr risk of falls: Transfer Assistance: Manual Signs or symptoms of abuse/neglect since last No Patient Identification Verified: Yes visito Secondary Verification Process Yes Hospitalized since last visit: No Completed: Has Dressing in Place as Prescribed: Yes Pain Present Now: No Electronic Signature(s) Signed: 04/21/2017 4:48:20 PM By: Montey Hora Entered By: Montey Hora on 04/21/2017 11:15:48 [name] ([id number]) -------------------------------------------------------------------------------- Clinic Level of Care Assessment Details [name]. Date of Service: 04/21/2017 11:00 AM Medical Record Patient Account Number: [id number] Number: Treating RN: Montey Hora Apr 28, 1934 (83 y.o. Other Clinician: Date of Birth/Sex: Female) Treating ROBSON, Franklin Primary Care Lucienne Sawyers: Loura Pardon Mattis Featherly/Extender: G Referring Herschell Virani: Loura Pardon Weeks in Treatment: 2 Clinic Level of Care Assessment Items TOOL 4 Quantity Score []  - Use when only an EandM is performed on FOLLOW-UP visit 0 ASSESSMENTS - Nursing Assessment / Reassessment X - Reassessment of Co-morbidities (includes updates in patient status) 1 10 X - Reassessment  of Adherence to Treatment Plan 1 5 ASSESSMENTS - Wound and Skin Assessment / Reassessment []  - Simple Wound Assessment / Reassessment - one wound 0 X - Complex Wound Assessment / Reassessment - multiple wounds 2 5 []  - Dermatologic / Skin Assessment (not related to wound area) 0 ASSESSMENTS - Focused Assessment []  - Circumferential Edema Measurements - multi extremities 0 []  - Nutritional Assessment / Counseling / Intervention 0 X - Lower Extremity Assessment (monofilament, tuning fork, pulses) 1 5 []  - Peripheral Arterial Disease Assessment (using hand held doppler) 0 ASSESSMENTS - Ostomy and/or Continence Assessment and Care []  - Incontinence Assessment and Management 0 []  - Ostomy Care Assessment and Management (repouching, etc.) 0 PROCESS - Coordination of Care X - Simple Patient / Family Education for ongoing care 1 15 []  - Complex (extensive) Patient / Family Education for ongoing care 0 []  - Staff obtains Programmer, systems, Records, Test Results / Process Orders 0 []  - Staff telephones HHA, Nursing Homes / Clarify orders / etc 0 JEANICE, DEMPSEY ([id number]) []  - Routine Transfer to another Facility (non-emergent condition) 0 []  - Routine Hospital Admission (non-emergent condition) 0 []  - New Admissions / Biomedical engineer / Ordering NPWT, Apligraf, etc. 0 []  - Emergency Hospital Admission (emergent condition) 0 X - Simple Discharge Coordination 1 10 []  - Complex (extensive) Discharge Coordination 0 PROCESS - Special Needs []  - Pediatric / Minor Patient Management 0 []  - Isolation Patient Management 0 []  - Hearing / Language / Visual special needs 0 []  - Assessment of Community assistance (transportation, D/C planning, etc.) 0 []  - Additional assistance / Altered mentation 0 []  - Support Surface(s) Assessment (bed, cushion, seat, etc.) 0 INTERVENTIONS - Wound Cleansing / Measurement []  - Simple Wound Cleansing - one wound 0 X - Complex Wound Cleansing - multiple wounds 2 5 X -  Wound Imaging (photographs - any number of wounds) 1 5 []  - Wound Tracing (instead of photographs) 0 []  -  Simple Wound Measurement - one wound 0 X - Complex Wound Measurement - multiple wounds 2 5 INTERVENTIONS - Wound Dressings []  - Small Wound Dressing one or multiple wounds 0 []  - Medium Wound Dressing one or multiple wounds 0 []  - Large Wound Dressing one or multiple wounds 0 []  - Application of Medications - topical 0 []  - Application of Medications - injection 0 Koziel, Shaniah Jerilynn Mages ([id number]) INTERVENTIONS - Miscellaneous []  - External ear exam 0 []  - Specimen Collection (cultures, biopsies, blood, body fluids, etc.) 0 []  - Specimen(s) / Culture(s) sent or taken to Lab for analysis 0 []  - Patient Transfer (multiple staff / Harrel Lemon Lift / Similar devices) 0 []  - Simple Staple / Suture removal (25 or less) 0 []  - Complex Staple / Suture removal (26 or more) 0 []  - Hypo / Hyperglycemic Management (close monitor of Blood Glucose) 0 []  - Ankle / Brachial Index (ABI) - do not check if billed separately 0 []  - Vital Signs 0 Has the patient been seen at the hospital within the last three years: Yes Total Score: 80 Level Of Care: New/Established - Level 3 Electronic Signature(s) Signed: 04/21/2017 4:48:20 PM By: Montey Hora Entered By: Montey Hora on 04/21/2017 14:43:30 [name] ([id number]) -------------------------------------------------------------------------------- Encounter Discharge Information Details [name]. Date of Service: 04/21/2017 11:00 AM Medical Record Patient Account Number: [id number] Number: Treating RN: Montey Hora 1934-10-13 (83 y.o. Other Clinician: Date of Birth/Sex: Female) Treating ROBSON, MICHAEL Primary Care Ashling Roane: Loura Pardon Mariselda Badalamenti/Extender: G Referring Finesse Fielder: Ayesha Mohair in Treatment: 2 Encounter Discharge Information Items Discharge Pain Level: 0 Discharge Condition: Stable Ambulatory  Status: Wheelchair Discharge Destination: Home Transportation: Private Auto Accompanied By: dtr Schedule Follow-up Appointment: No Medication Reconciliation completed and provided to Patient/Care No Kipp Shank: Provided on Clinical Summary of Care: 04/21/2017 Form Type Recipient Paper Patient FW Electronic Signature(s) Signed: 04/21/2017 2:44:32 PM By: Montey Hora Previous Signature: 04/21/2017 11:52:15 AM Version By: Ruthine Dose Entered By: Montey Hora on 04/21/2017 14:44:31 Betzer, Blima Singer ([id number]) -------------------------------------------------------------------------------- Lower Extremity Assessment Details [name]. Date of Service: 04/21/2017 11:00 AM Medical Record Patient Account Number: [id number] Number: Treating RN: Montey Hora 06-Jun-1934 (83 y.o. Other Clinician: Date of Birth/Sex: Female) Treating ROBSON, MICHAEL Primary Care Ramere Downs: Loura Pardon Decklyn Hornik/Extender: G Referring Shayann Garbutt: Loura Pardon Weeks in Treatment: 2 Vascular Assessment Pulses: Dorsalis Pedis Palpable: [Left:Yes] Posterior Tibial Extremity colors, hair growth, and conditions: Extremity Color: [Left:Normal] Hair Growth on Extremity: [Left:No] Temperature of Extremity: [Left:Warm] Capillary Refill: [Left:< 3 seconds] Electronic Signature(s) Signed: 04/21/2017 4:48:20 PM By: Montey Hora Entered By: Montey Hora on 04/21/2017 11:42:19 [name] ([id number]) -------------------------------------------------------------------------------- [place] Details [name]. Date of Service: 04/21/2017 11:00 AM Medical Record Patient Account Number: [id number] Number: Treating RN: Cornell Barman 1934-01-17 (83 y.o. Other Clinician: Date of Birth/Sex: Female) Treating ROBSON, MICHAEL Primary Care Axton Cihlar: Loura Pardon Sarika Baldini/Extender: G Referring Wilberta Dorvil: Loura Pardon Weeks in Treatment:  2 Active Inactive Electronic Signature(s) Signed: 04/23/2017 7:55:19 AM By: Gretta Cool, BSN, RN, CWS, Kim RN, BSN Entered By: Gretta Cool, BSN, RN, CWS, Kim on 04/23/2017 07:55:18 [name] ([id number]) -------------------------------------------------------------------------------- Pain Assessment Details Patient Name: BREINDEL, COLLIER. Date of Service: 04/21/2017 11:00 AM Medical Record Patient Account Number: [id number] Number: Treating RN: Montey Hora 1934-09-05 (83 y.o. Other Clinician: Date of Birth/Sex: Female) Treating ROBSON, MICHAEL Primary Care Wei Poplaski: Loura Pardon Nasire Reali/Extender: G Referring Deiondra Denley: Loura Pardon Weeks in Treatment: 2 Active Problems Location of  Pain Severity and Description of Pain Patient Has Paino No Site Locations Pain Management and Medication Current Pain Management: Notes Topical or injectable lidocaine is offered to patient for acute pain when surgical debridement is performed. If needed, Patient is instructed to use over the counter pain medication for the following 24-48 hours after debridement. Wound care MDs do not prescribed pain medications. Patient has chronic pain or uncontrolled pain. Patient has been instructed to make an appointment with their Primary Care Physician for pain management. Electronic Signature(s) Signed: 04/21/2017 4:48:20 PM By: Montey Hora Entered By: Montey Hora on 04/21/2017 11:15:57 [name] ([id number]) -------------------------------------------------------------------------------- Patient/Caregiver Education Details [name]. Date of Service: 04/21/2017 11:00 AM Medical Record Patient Account Number: [id number] Number: Treating RN: Montey Hora Jul 20, 1934 (83 y.o. Other Clinician: Date of Birth/Gender: Female) Treating ROBSON, MICHAEL Primary Care Physician/Extender: Ambrose Mantle, [place] Physician: Suella [place] in Treatment: 2 Referring Physician: Loura Pardon Education Assessment Education Provided To: Patient and Caregiver Education Topics Provided Basic Hygiene: Handouts: Other: care of newly healed ulcer sites and reportable s/s Methods: Explain/Verbal Responses: State content correctly Electronic Signature(s) Signed: 04/21/2017 4:48:20 PM By: Montey Hora Entered By: Montey Hora on 04/21/2017 14:47:00 [name] ([id number]) -------------------------------------------------------------------------------- Wound Assessment Details [name]. Date of Service: 04/21/2017 11:00 AM Medical Record Patient Account Number: [id number] Number: Treating RN: Montey Hora 03-12-1934 (83 y.o. Other Clinician: Date of Birth/Sex: Female) Treating ROBSON, MICHAEL Primary Care Redmond Whittley: Loura Pardon Coltin Casher/Extender: G Referring Elyn Krogh: Loura Pardon Weeks in Treatment: 2 Wound Status Wound Number: 2 Primary Etiology: Cyst Wound Location: Left, Posterior Knee Wound Status: Open Wounding Event: Not Known Date Acquired: 10/22/2016 Weeks Of Treatment: 2 Clustered Wound: No Pending Amputation On Presentation Photos Photo Uploaded By: Montey Hora on 04/21/2017 15:00:47 Wound Measurements Length: (cm) 0 % Reducti Width: (cm) 0 % Reducti Depth: (cm) 0 Area: (cm) 0 Volume: (cm) 0 on in Area: 100% on in Volume: 100% Wound Description Full Thickness Without Exposed Classification: Support Structures Periwound Skin Texture Texture Color No Abnormalities Noted: No No Abnormalities Noted: No Moisture No Abnormalities Noted: No CARISHA, KANTOR ([id number]) Electronic Signature(s) Signed: 04/21/2017 4:48:20 PM By: Montey Hora Entered By: Montey Hora on 04/21/2017 11:21:14 [name] ([id number]) -------------------------------------------------------------------------------- Wound Assessment Details [name]. Date of Service: 04/21/2017 11:00 AM Medical  Record Patient Account Number: [id number] Number: Treating RN: Montey Hora Sep 15, 1934 (83 y.o. Other Clinician: Date of Birth/Sex: Female) Treating ROBSON, MICHAEL Primary Care Riggins Cisek: Loura Pardon Merritt Mccravy/Extender: G Referring Chantell Kunkler: Loura Pardon Weeks in Treatment: 2 Wound Status Wound Number: 3 Primary Open Surgical Wound Etiology: Wound Location: Left, Anterior Knee Wound Healed - Epithelialized Wounding Event: Gradually Appeared Status: Date Acquired: 10/22/2016 Comorbid Cataracts, Sleep Apnea, Arrhythmia, Weeks Of Treatment: 2 History: Hypertension, Scleroderma Clustered Wound: No Pending Amputation On Presentation Photos Photo Uploaded By: Montey Hora on 04/21/2017 15:00:47 Wound Measurements Length: (cm) 0 % Reduction in Ar Width: (cm) 0 % Reduction in Vo Depth: (cm) 0 Epithelialization Area: (cm) 0 Tunneling: Volume: (cm) 0 Undermining: ea: 100% lume: 100% : Large (67-100%) No No Wound Description Classification: Partial Thickness Foul Odor After Wound Margin: Flat and Intact Slough/Fibrino Exudate Amount: Medium Exudate Type: Serous Exudate Color: amber Cleansing: No No Wound Bed Granulation Amount: Large (67-100%) Exposed Structure Granulation Quality: Red Fascia Exposed: No Critzer, Blima Singer ([id number]) Necrotic Amount: None Present (0%) Fat Layer (Subcutaneous Tissue) Exposed: No Tendon Exposed: No Muscle Exposed: No Joint Exposed: No Bone  Exposed: No Periwound Skin Texture Texture Color No Abnormalities Noted: No No Abnormalities Noted: No Callus: No Atrophie Blanche: No Crepitus: No Cyanosis: No Excoriation: No Ecchymosis: No Induration: No Erythema: No Rash: No Hemosiderin Staining: No Scarring: No Mottled: No Pallor: No Moisture Rubor: No No Abnormalities Noted: No Dry / Scaly: No Temperature / Pain Maceration: No Temperature: No Abnormality Wound Preparation Ulcer Cleansing: Rinsed/Irrigated with  Saline Topical Anesthetic Applied: None Electronic Signature(s) Signed: 04/21/2017 4:48:20 PM By: Montey Hora Entered By: Montey Hora on 04/21/2017 11:44:11 Creer, Blima Singer ([id number]) -------------------------------------------------------------------------------- Vitals Details [name]. Date of Service: 04/21/2017 11:00 AM Medical Record Patient Account Number: [id number] Number: Treating RN: Montey Hora 30-Jan-1934 (83 y.o. Other Clinician: Date of Birth/Sex: Female) Treating ROBSON, MICHAEL Primary Care Ismael Treptow: Loura Pardon Minor Iden/Extender: G Referring Torrion Witter: Loura Pardon Weeks in Treatment: 2 Vital Signs Time Taken: 11:16 Temperature (F): 98.1 Height (in): 66 Pulse (bpm): 61 Weight (lbs): 210 Respiratory Rate (breaths/min): 18 Body Mass Index (BMI): 33.9 Blood Pressure (mmHg): 113/52 Reference Range: 80 - 120 mg / dl Electronic Signature(s) Signed: 04/21/2017 4:48:20 PM By: Montey Hora Entered By: Montey Hora on 04/21/2017 11:16:31

## 2017-04-22 NOTE — Progress Notes (Signed)
LAKASHIA, [name] ([id number]) Visit Report for 04/21/2017 HPI Details [name], [name]. Date of Service: 04/21/2017 11:00 AM Medical Record Patient Account Number: [id number] Number: Treating RN: Montey Hora Sep 25, 1934 (83 y.o. Other Clinician: Date of Birth/Sex: Female) Treating Reeta Kuk Primary Care Provider: Loura Pardon Provider/Extender: G Referring Provider: Loura Pardon Weeks in Treatment: 2 History of Present Illness HPI Description: 10/28/16; this is an 83 year old woman who arrives with a complicated medical issue. 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 Initially in 2000 and apparently most recently in 2014. 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 Indeed she was hospitalized on 02/13/16 and discharged on 02/19/16. She was taken to the OR and it was found that the popliteal abscess was communicating with her joint space. Signed to feel fluid grew MSSA. Patient was treated with IandD of the abscess. She completed IV cefazolin on 03/28/16 and since then has been on Keflex and rifampin for chronic suppression. Her infectious disease doctor is Dr.Shrestha at [facility]. The patient was seen in our [place] sister clinic through much of October and November 2017. At that point she had a draining sinus that eventually closed over. She noted some pain behind her knee surrounding Christmas day and was seen in our emergency room on 10/23/16. During this ER visit she underwent tendon incision and drainage of a left Baker's cyst and clear colored synovial fluid was obtained. Doxycycline was added for a week to her Keflex and rifampin. A CT scan was ordered but I don't think was ever done due to artifacts from hardware. Her wound culture was negative. She has been using iodoform packing. She states her knee is painful when she stands on it for a period of time. She is not systemically unwell. She has a history  of scleroderma or without is been under control for some period of time. 11/04/16 the culture that I did of this area last week showed coag-negative staph and a few enterococcus faecium. Enterococcus is ampicillin resistant. The coag negative staph is only sensitive to vancomycin and tetracycline. Previous joint infection was methicillin sensitive staph aureus. It seems I misunderstood what she said and that her infectious disease clinic appointment is this Friday and the orthopedic appointment was last Friday. I faxed the culture results to the infectious disease clinic but I've given him a copy of it today. Per the patient and her family they orthopedic surgeon did not want to do anything further to this joint. The patient is not systemically unwell in particular no fever or chills and the pain she has is really minimal 11/18/16; this is a patient who has an infected left knee prosthetic joint. She went to see infectious disease at [facility] who apparently did did not recommend any further antibiotic therapy. She continues to feel the same episodic pain but no systemic symptoms of particular no fever. The depth of the wound in the popliteal fossa of her left knee is 3.5 versus 5 cm last week 11/25/16 4.5 cm of depth today. Draining exit site. We have treating this with iodoform packing however I really have no expectation of healing and to be truthful the amount of drainage that is coming out of this knee I don't think closure of this site is necessarily considered a positive outcome. Her left knee remains warm but not painful there is an effusion she has no systemic symptoms no fever no chills or and she feels well [name], [name] (  [id number]) 12/31/16. Still is same amount of depth today using a skinny at 4.6 cm. There is less drainage. We have been using iodoform packing and I really have no expectation of healing and to be truthful closing this sinus probably is really not the best thing to  consider. 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 She is not felt to be a candidate for a fourth replacement. In the meantime she does not complain of pain except when the dressing is being changed episodically. She has not been systemically unwell no fever or chills or other issues. 01/14/17; after the patient left on 3/7 her daughter tells me the next day the wound had closed over and therefore this is not intact since. Over the last 3 days the patient has noted significant pain in the popliteal fossa of the involved left knee. 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 MSSA was the cause of the original identified joint infection treated at [facility]. 01/21/17; I saw this patient last week at which time there was intense erythema in the popliteal fossa, tenderness and a ballotable fluid collection. I did an IandD on this. Surprisingly the culture of this was negative although as a result of the IandD her tenderness and erythema in the area is resolved. We continue with the iodoform packing READMISSION 04/07/17; this is a patient I know from 2 prior admissions to this clinic starting in January of this year. Her history is essentially as noted above. 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 She has a draining sinus into the popliteal fossa of the left knee. This remains open and drains. She has been using iodoform packing to this area with gauze over the top. To be truthful I haven't really been certain whether having this area close would be helpful or harmful to this patient. Previously when it's closed she develops a lot more pain in the back of the popliteal fossa and more pain in the knee. She is here today because the packing of the area has become increasingly painful in fact the patient describes this as a  "torture chamber" experience. They are packing this wound twice a day. The patient follows with orthopedic surgery and infectious disease at [facility] although she hasn't seen them since she was last here in March. She denies any systemic symptoms including fever chills etc. She states except for the changing of the dressing her pain when she is mobilizing on the knee is 2/10. She also has a area on the left anterior patella in the incision that is excoriated open. The patient states this is itchy and she does a lot of scratching. They state this was here last time she was here but I have no recollection or documentation of this 04/21/17; patient was readmitted to clinic 2 weeks ago. She has a draining sinus in the left popliteal fossa and a chronically infected left total knee replacement. She also had an excoriation on the left anterior knee. She arrives in clinic today with all of this looking considerably better. The divot in the popliteal fossa is actually closed Electronic Signature(s) Signed: 04/21/2017 4:49:17 PM By: Linton Ham MD Entered By: Linton Ham on 04/21/2017 16:41:07 [name] ([id number]) -------------------------------------------------------------------------------- Physical Exam Details Patient Name: [name]. Date of Service: 04/21/2017 11:00 AM Medical Record Patient Account Number: [id number] Number: Treating  RN: Montey Hora Jan 15, 1934 (83 y.o. Other Clinician: Date of Birth/Sex: Female) Treating Corrie Reder Primary Care Provider: Loura Pardon Provider/Extender: G Referring Provider: Loura Pardon Weeks in Treatment: 2 Constitutional Sitting or standing Blood Pressure is within target range for patient.. Pulse regular and within target range for patient.Marland Kitchen Respirations regular, non-labored and within target range.. Temperature is normal and within the target range for the patient.Marland Kitchen appears in no distress. Notes Wound exam;  problematic area in the left popliteal fossa has no open area. There is no tenderness in this area of this. oThe area over the left anterior knee circular area of excoriated skin only has 2 small eschar areas. I think these are fine Electronic Signature(s) Signed: 04/21/2017 4:49:17 PM By: Linton Ham MD Entered By: Linton Ham on 04/21/2017 16:41:56 [name] ([id number]) -------------------------------------------------------------------------------- Physician Orders Details Patient Name: [name]. Date of Service: 04/21/2017 11:00 AM Medical Record Patient Account Number: [id number] Number: Treating RN: Montey Hora 1934-01-26 (83 y.o. Other Clinician: Date of Birth/Sex: Female) Treating Ewing Fandino Primary Care Provider: Loura Pardon Provider/Extender: G Referring Provider: Loura Pardon Weeks in Treatment: 2 Verbal / Phone Orders: No Diagnosis Coding Discharge From [facility] Services o Discharge from [place] Signature(s) Signed: 04/21/2017 4:48:20 PM By: Montey Hora Signed: 04/21/2017 4:49:17 PM By: Linton Ham MD Entered By: Montey Hora on 04/21/2017 11:50:14 [name] ([id number]) -------------------------------------------------------------------------------- Problem List Details Patient Name: TEIRA, ARCILLA. Date of Service: 04/21/2017 11:00 AM Medical Record Patient Account Number: [id number] Number: Treating RN: Montey Hora [birth year] (83 y.o. Other Clinician: Date of Birth/Sex: Female) Treating Kylyn Mcdade Primary Care Provider: Loura Pardon Provider/Extender: G Referring Provider: Loura Pardon Weeks in Treatment: 2 Active Problems ICD-10 Encounter Code Description Active Date Diagnosis M71.22 Synovial cyst of popliteal space [Baker], left knee 04/07/2017 Yes B95.61 Methicillin susceptible Staphylococcus aureus infection as 04/07/2017 Yes the cause of diseases classified  elsewhere L02.416 Cutaneous abscess of left lower limb 04/07/2017 Yes L97.928 Non-pressure chronic ulcer of unspecified part of left lower 04/07/2017 Yes leg with other specified severity Inactive Problems Resolved Problems Electronic Signature(s) Signed: 04/21/2017 4:49:17 PM By: Linton Ham MD Entered By: Linton Ham on 04/21/2017 16:40:12 Dorin, Blima Singer ([id number]) -------------------------------------------------------------------------------- Progress Note Details Patient Name: [name]. Date of Service: 04/21/2017 11:00 AM Medical Record Patient Account Number: [id number] Number: Treating RN: Montey Hora 06-28-1934 (83 y.o. Other Clinician: Date of Birth/Sex: Female) Treating Terrionna Bridwell Primary Care Provider: Loura Pardon Provider/Extender: G Referring Provider: Loura Pardon Weeks in Treatment: 2 Subjective History of Present Illness (HPI) 10/28/16; this is an 83 year old woman who arrives with a complicated medical issue. 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 Initially in 2000 and apparently most recently in 2014. 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 Indeed she was hospitalized on 02/13/16 and discharged on 02/19/16. She was taken to the OR and it was found that the popliteal abscess was communicating with her joint space. Signed to feel fluid grew MSSA. Patient was treated with IandD of the abscess. She completed IV cefazolin on 03/28/16 and since then has been on Keflex and rifampin for chronic suppression. Her infectious disease doctor is Dr.Shrestha at [facility]. The patient was seen in our [place] sister clinic through much of October and November 2017. At that point she had a draining sinus that eventually closed over. She noted some pain behind her knee surrounding Christmas day and was seen in our emergency room  on 10/23/16. During this ER visit she underwent tendon incision and  drainage of a left Baker's cyst and clear colored synovial fluid was obtained. Doxycycline was added for a week to her Keflex and rifampin. A CT scan was ordered but I don't think was ever done due to artifacts from hardware. Her wound culture was negative. She has been using iodoform packing. She states her knee is painful when she stands on it for a period of time. She is not systemically unwell. She has a history of scleroderma or without is been under control for some period of time. 11/04/16 the culture that I did of this area last week showed coag-negative staph and a few enterococcus faecium. Enterococcus is ampicillin resistant. The coag negative staph is only sensitive to vancomycin and tetracycline. Previous joint infection was methicillin sensitive staph aureus. It seems I misunderstood what she said and that her infectious disease clinic appointment is this Friday and the orthopedic appointment was last Friday. I faxed the culture results to the infectious disease clinic but I've given him a copy of it today. Per the patient and her family they orthopedic surgeon did not want to do anything further to this joint. The patient is not systemically unwell in particular no fever or chills and the pain she has is really minimal 11/18/16; this is a patient who has an infected left knee prosthetic joint. She went to see infectious disease at [facility] who apparently did did not recommend any further antibiotic therapy. She continues to feel the same episodic pain but no systemic symptoms of particular no fever. The depth of the wound in the popliteal fossa of her left knee is 3.5 versus 5 cm last week 11/25/16 4.5 cm of depth today. Draining exit site. We have treating this with iodoform packing however I really have no expectation of healing and to be truthful the amount of drainage that is coming out of this knee I don't think closure of this site is necessarily considered a positive outcome. Her  left knee remains warm but not painful there is an effusion she has no systemic symptoms no fever no chills or and she feels well 12/31/16. Still is same amount of depth today using a skinny at 4.6 cm. There is less drainage. We have been using iodoform packing and I really have no expectation of healing and to be truthful closing this sinus IMARI, REEN. ([id number]) probably is really not the best thing to consider. 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 She is not felt to be a candidate for a fourth replacement. In the meantime she does not complain of pain except when the dressing is being changed episodically. She has not been systemically unwell no fever or chills or other issues. 01/14/17; after the patient left on 3/7 her daughter tells me the next day the wound had closed over and therefore this is not intact since. Over the last 3 days the patient has noted significant pain in the popliteal fossa of the involved left knee. 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 MSSA was the cause of the original identified joint infection treated at [facility]. 01/21/17; I saw this patient last week at which time there was intense erythema in the popliteal fossa, tenderness and a ballotable fluid collection. I did an IandD on this. Surprisingly the culture of this was negative although as a result of the IandD her  tenderness and erythema in the area is resolved. We continue with the iodoform packing READMISSION 04/07/17; this is a patient I know from 2 prior admissions to this clinic starting in January of this year. Her history is essentially as noted above. 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 She has a draining sinus into the popliteal fossa of the left knee. This remains open and drains. She has been using iodoform  packing to this area with gauze over the top. To be truthful I haven't really been certain whether having this area close would be helpful or harmful to this patient. Previously when it's closed she develops a lot more pain in the back of the popliteal fossa and more pain in the knee. She is here today because the packing of the area has become increasingly painful in fact the patient describes this as a "torture chamber" experience. They are packing this wound twice a day. The patient follows with orthopedic surgery and infectious disease at [facility] although she hasn't seen them since she was last here in March. She denies any systemic symptoms including fever chills etc. She states except for the changing of the dressing her pain when she is mobilizing on the knee is 2/10. She also has a area on the left anterior patella in the incision that is excoriated open. The patient states this is itchy and she does a lot of scratching. They state this was here last time she was here but I have no recollection or documentation of this 04/21/17; patient was readmitted to clinic 2 weeks ago. She has a draining sinus in the left popliteal fossa and a chronically infected left total knee replacement. She also had an excoriation on the left anterior knee. She arrives in clinic today with all of this looking considerably better. The divot in the popliteal fossa is actually closed Objective Constitutional Sitting or standing Blood Pressure is within target range for patient.. Pulse regular and within target range for patient.Marland Kitchen Respirations regular, non-labored and within target range.. Temperature is normal and within CYNDEL, GRIFFEY. ([id number]) the target range for the patient.Marland Kitchen appears in no distress. Vitals Time Taken: 11:16 AM, Height: 66 in, Weight: 210 lbs, BMI: 33.9, Temperature: 98.1 F, Pulse: 61 bpm, Respiratory Rate: 18 breaths/min, Blood Pressure: 113/52 mmHg. General Notes: Wound exam;  problematic area in the left popliteal fossa has no open area. There is no tenderness in this area of this. The area over the left anterior knee circular area of excoriated skin only has 2 small eschar areas. I think these are fine Integumentary (Hair, Skin) Wound #2 status is Open. Original cause of wound was Not Known. The wound is located on the Left,Posterior Knee. The wound measures 0cm length x 0cm width x 0cm depth; 0cm^2 area and 0cm^3 volume. Wound #3 status is Healed - Epithelialized. Original cause of wound was Gradually Appeared. The wound is located on the Left,Anterior Knee. The wound measures 0cm length x 0cm width x 0cm depth; 0cm^2 area and 0cm^3 volume. There is no tunneling or undermining noted. There is a medium amount of serous drainage noted. The wound margin is flat and intact. There is large (67-100%) red granulation within the wound bed. There is no necrotic tissue within the wound bed. The periwound skin appearance did not exhibit: Callus, Crepitus, Excoriation, Induration, Rash, Scarring, Dry/Scaly, Maceration, Atrophie Blanche, Cyanosis, Ecchymosis, Hemosiderin Staining, Mottled, Pallor, Rubor, Erythema. Periwound temperature was noted as  No Abnormality. Assessment Active Problems ICD-10 M71.22 - Synovial cyst of popliteal space [Baker], left knee B95.61 - Methicillin susceptible Staphylococcus aureus infection as the cause of diseases classified elsewhere L02.416 - Cutaneous abscess of left lower limb L97.928 - Non-pressure chronic ulcer of unspecified part of left lower leg with other specified severity Plan Discharge From [facility] Services: Discharge from [facility] [place] ([id number]) #1 I think the patient can be discharged from the wound care center #2 I have gone over the issue with this knee as I see it. It is possible that the area on the popliteal fossa will reopen and or require reopening to provide an exit site of the drainage and the  chronically infected knee. #3 excoriations on the front of the knee are healed. Electronic Signature(s) Signed: 04/21/2017 4:49:17 PM By: Linton Ham MD Entered By: Linton Ham on 04/21/2017 16:42:46 [name] ([id number]) -------------------------------------------------------------------------------- SuperBill Details Patient Name: [name]. Date of Service: 04/21/2017 Medical Record Patient Account Number: [id number] Number: Treating RN: Montey Hora 08/14/1934 (83 y.o. Other Clinician: Date of Birth/Sex: Female) Treating Ettore Trebilcock Primary Care Provider: Loura Pardon Provider/Extender: G Referring Provider: Loura Pardon Weeks in Treatment: 2 Diagnosis Coding ICD-10 Codes Code Description M71.22 Synovial cyst of popliteal space [Baker], left knee Methicillin susceptible Staphylococcus aureus infection as the cause of diseases B95.61 classified elsewhere L02.416 Cutaneous abscess of left lower limb Non-pressure chronic ulcer of unspecified part of left lower leg with other specified L97.928 severity Facility Procedures CPT4 Code: [id number] Description: 99213 - WOUND CARE VISIT-LEV 3 EST PT Modifier: Quantity: 1 Physician Procedures CPT4: Description Modifier Quantity Code [id number] 64332 - WC PHYS LEVEL 2 - EST PT 1 ICD-10 Description Diagnosis M71.22 Synovial cyst of popliteal space [Baker], left knee L02.416 Cutaneous abscess of left lower limb L97.928 Non-pressure chronic ulcer of  unspecified part of left lower leg with other specified severity Electronic Signature(s) Signed: 04/21/2017 4:49:17 PM By: Linton Ham MD Entered By: Linton Ham on 04/21/2017 16:43:10

## 2017-04-22 NOTE — Telephone Encounter (Signed)
°  Follow Up  6/27/18ptt called in, states she made appointment on 7/2 because pharmacy stated they could not refill her medication until she made a follow up appointment.

## 2017-04-23 NOTE — Telephone Encounter (Signed)
Per Dr Jackalyn Lombard last note she could stop the Metoprolol:  2. SVT Resolved s/p ablation She is reluctant to stop metoprolol.  I will therefore reduce to 12.5mg  BID today.  She could stop this at any time. Pulmonary notes suggest Afib, however, I have only seen SVT.  I do NOT see that she has had afib previously.  Then it's looks as Dr. [name] made some changes and the patient was still taking.  I have spoken with both patient and her daughter today.  She is going to decrease her Metoprolol to 12.5 mg daily for 1 week then stop.  If she has problems after stopping will call back and schedule office visit otherwise will see prn and she can follow up with Dr [name].  Both daughter and patient verbalized understanding and thanked me for calling.

## 2017-04-27 ENCOUNTER — Ambulatory Visit: Payer: Self-pay | Admitting: Internal Medicine

## 2017-05-05 ENCOUNTER — Telehealth: Payer: Self-pay

## 2017-05-05 NOTE — Telephone Encounter (Signed)
Pt notified she said she will cancel the PT and have them call us with any additional questions

## 2017-05-05 NOTE — Telephone Encounter (Signed)
I think the last PT ref I did was for balance in 12/17

## 2017-05-05 NOTE — Telephone Encounter (Signed)
Pt said PT called and set up appt; pt did not know she was supposed to go for more therapy and wants to know if Dr [name] is aware and did Dr [name] order PT. Pt request cb.

## 2017-05-25 ENCOUNTER — Ambulatory Visit (INDEPENDENT_AMBULATORY_CARE_PROVIDER_SITE_OTHER): Payer: Medicare Other | Admitting: Pulmonary Disease

## 2017-05-25 ENCOUNTER — Encounter: Payer: Self-pay | Admitting: Pulmonary Disease

## 2017-05-25 DIAGNOSIS — J9611 Chronic respiratory failure with hypoxia: Secondary | ICD-10-CM | POA: Diagnosis not present

## 2017-05-25 DIAGNOSIS — R053 Chronic cough: Secondary | ICD-10-CM

## 2017-05-25 DIAGNOSIS — R05 Cough: Secondary | ICD-10-CM | POA: Diagnosis not present

## 2017-05-25 NOTE — Patient Instructions (Signed)
You have a chronic cough that is out of proportion to degree of scarring in the lungs. This may be related to sinus or reflux we will treat this sequentially  Trial of chlorpheniramine 8 mg at bedtime with Sudafed 60 mg in the daytime for 4 weeks  Call us to report if cough is better  If not, we will trial her reflux medication for 4 weeks

## 2017-05-25 NOTE — Assessment & Plan Note (Signed)
Continue 2 L of oxygen during sleep. Hypoxia was demonstrated on nocturnal oximetry on CPAP/room air -and therefore seems to be unrelated to OSA

## 2017-05-25 NOTE — Assessment & Plan Note (Signed)
chronic cough that is out of proportion to bronchiectasis/ degree of scarring in the lungs. This may be related to sinus or reflux we will treat this sequentially  Trial of chlorpheniramine 8 mg at bedtime with Sudafed 60 mg in the daytime for 4 weeks  Call us to report if cough is better  If not, we will trial her reflux medication for 4 weeks

## 2017-05-25 NOTE — Progress Notes (Signed)
   Subjective:    Patient ID: [name], female    DOB: [birth year], 83 y.o.   MRN: [id number]  HPI  83 year old woman with pulmonary hypertension, OSA and Chronic hypoxic respiratory failure on O2 (w/ CPAP) , PE , Cor Pulmonale and PSVT /A-Fib s/p Ablation 02/2015   She had a complicated course after left knee replacement in 2013 due to a DVT/PE.  Off Xarelto Dec 2015.  She has hx of Atrial Fib and PSVT s/p ablation in 02/2015  Chief Complaint  Patient presents with  . Follow-up    6 month follow up. Breathing has been the same since the last visit. Still has a cough.    Accompanied by her daughter  She was unable to use the CPAP and ultimately turned it and has been maintained on nocturnal oxygen since 2017. She is compliant with this and this is helping her memory and concentration issues  She is on long-term antibiotics-Keflex and rifampin for left infected knee prosthesis  She continues to have chronic dry cough which occasionally wakes her up at night and keeps family members up. Treatment in the past with PPI has not helped. Tessalon Perles did not help she takes Delsym chronically  Significant tests/ events  RHC (06/2014)  RA mean 6  PA 43/12, mean 25  PCWP mean 16  PVR 1.6 WU  CI 2.9   PFTs (05/25/14) FEV1 1.88 L (88%) FVC 2.22 L (77%) ratio 114, DLCO 68%   HRCT chest 01/2015 mild bronchiectasis right lower lobe VQ 05/30/14: Low prob   PSG 06/2014 >> severe OSA, AHI 62 per hour, corrected by CPAP 12 cm with small fullface mask. Central apneas emergent higher pressures  ONO on CPPA/RA shows 71 min desatn -Add 2L O2 to CPAP ONO on CPAP/2 L O2 >>no desatn  Echo 4/18/16demonstrated EF 55%, mild MR, moderate TR, PA pressure 57, RV severely dilated, small pericardial effusion, LA 37.   Review of Systems neg for any significant sore throat, dysphagia, itching, sneezing, nasal congestion or excess/ purulent secretions, fever, chills, sweats, unintended wt  loss, pleuritic or exertional cp, hempoptysis, orthopnea pnd or change in chronic leg swelling. Also denies presyncope, palpitations, heartburn, abdominal pain, nausea, vomiting, diarrhea or change in bowel or urinary habits, dysuria,hematuria, rash, arthralgias, visual complaints, headache, numbness weakness or ataxia.     Objective:   Physical Exam   Gen. Pleasant, well-nourished, in no distress ENT - no thrush, no post nasal drip Neck: No JVD, no thyromegaly, no carotid bruits Lungs: no use of accessory muscles, no dullness to percussion, clear without rales or rhonchi  Cardiovascular: Rhythm regular, heart sounds  normal, no murmurs or gallops, no peripheral edema Musculoskeletal: No deformities, no cyanosis or clubbing , left knee wound dry, scar          Assessment & Plan:

## 2017-06-26 ENCOUNTER — Encounter: Payer: Self-pay | Admitting: Family Medicine

## 2017-06-26 ENCOUNTER — Ambulatory Visit (INDEPENDENT_AMBULATORY_CARE_PROVIDER_SITE_OTHER)
Admission: RE | Admit: 2017-06-26 | Discharge: 2017-06-26 | Disposition: A | Discharge status: Home / Self Care | Payer: Medicare Other | Source: Ambulatory Visit | Attending: Family Medicine | Admitting: Family Medicine

## 2017-06-26 ENCOUNTER — Ambulatory Visit (INDEPENDENT_AMBULATORY_CARE_PROVIDER_SITE_OTHER): Payer: Medicare Other | Admitting: Family Medicine

## 2017-06-26 VITALS — BP 128/70 | HR 74 | Temp 98.2°F | Ht 66.0 in | Wt 214.2 lb

## 2017-06-26 DIAGNOSIS — R4589 Other symptoms and signs involving emotional state: Secondary | ICD-10-CM | POA: Diagnosis not present

## 2017-06-26 DIAGNOSIS — R413 Other amnesia: Secondary | ICD-10-CM

## 2017-06-26 DIAGNOSIS — R197 Diarrhea, unspecified: Secondary | ICD-10-CM | POA: Diagnosis not present

## 2017-06-26 NOTE — Patient Instructions (Addendum)
I want you to get 64 oz of fluid daily (mostly water)   Let's do a abdominal xray today   Also check for c diff (since you are on antibiotics)   Try citrucel fiber supplement once daily  This may bulk up and help control stools

## 2017-06-26 NOTE — Progress Notes (Signed)
Subjective:    Patient ID: [name], female    DOB: 01-17-1934, 83 y.o.   MRN: [id number]  HPI Here for diarrhea   Urgent stools - several months (usually twice per week)  Not able to make it to the bathroom - very soft/ occ watery  occ runs down legs No blood in stool  Has tried changing diet (tried BRAT)  No relation to diet   Drinks water with meals and then a glass at night (about 8 oz)   No cramping or abdominal pain  Family notes she had a cramp several times on the right side   Usually just once per day   Still on an abx for knee - this is lifetime  On keflex - has to stay on it   Not taking miralax  Not constipated   Daughter gave me a note while she was here siting other problems including mood issues and forgetfulness and worse hearing   Wt Readings from Last 3 Encounters:  06/26/17 214 lb 4 oz (97.2 kg)  05/25/17 213 lb 12.8 oz (97 kg)  11/11/16 207 lb 3.2 oz (94 kg)   BP Readings from Last 3 Encounters:  06/26/17 128/70  05/25/17 116/72  11/11/16 122/70   Pulse Readings from Last 3 Encounters:  06/26/17 74  05/25/17 73  11/11/16 72    Last colonoscopy 2015 - with polyps/no recall   Patient Active Problem List   Diagnosis Date Noted  . Poor balance 09/26/2016  . Neck pain 07/30/2016  . Diarrhea 07/30/2016  . Routine general medical examination at a health care facility 06/26/2016  . Elevated TSH 06/25/2016  . Stress reaction 06/04/2016  . Intertrigo 06/04/2016  . Chronic cough 05/01/2016  . Constipation 01/09/2016  . Umbilical hernia 73/71/0626  . Chronic respiratory failure ([place]) 10/05/2015  . Thromboembolism of vein 10/02/2015  . Infection of prosthetic joint ([place]) 09/18/2015  . Infection of prosthetic left knee joint ([place]) 09/12/2015  . Effusion of knee 08/30/2015  . Pulmonary hypertension ([place])   . Physical deconditioning 02/25/2015  . RBBB-new 02/12/2015  . History of bilat PE after TKR (off Coumadin Dec 2015) 02/12/2015  .  Abnormal EKG-TWI, RBBB, prolonged QT   . Hernia, umbilical 94/85/4627  . Hx of colonic polyps 10/24/2014  . Diverticulosis of colon without hemorrhage   . OSA (obstructive sleep apnea) 07/22/2014  . Unspecified arthropathy, lower leg 03/21/2014  . History of right knee joint replacement 03/15/2014  . Arthritis of knee 03/09/2014  . Solitary pulmonary nodule 05/03/2013  . Sternal fracture 04/26/2013  . PE (pulmonary embolism) 11/24/2012  . History of DVT (deep vein thrombosis) 11/24/2012  . History of artificial joint 10/29/2012  . 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 BP (high blood pressure) 10/14/2012  . Complications due to internal joint prosthesis ([place]) 09/07/2012  . PES PLANUS 08/15/2010  . URINARY FREQUENCY, CHRONIC 07/14/2008  . Dyslipidemia 07/09/2007  . Essential hypertension 07/09/2007  . PSVT with SSS 02/23/2007  . SCLERODERMA 02/23/2007  . DEGENERATIVE JOINT DISEASE 02/23/2007  . SPINAL STENOSIS, LUMBAR 02/23/2007  . History of osteopenia 02/23/2007  . SPONDYLOLISTHESIS 02/23/2007   Past Medical History:  Diagnosis Date  . Anemia   . Arthritis    knee  . Diverticulosis   . Hx of colonic polyps 10/24/2014  . Hyperlipidemia   . Hypertension   . Osteopenia   . Overactive bladder    urge incontinence  . PSVT (paroxysmal supraventricular tachycardia) ([place])   .  Pulmonary emboli ([place])   . Pulmonary hypertension ([place])   . Right ventricular failure ([place])   . Sleep apnea    CPAP  . Spinal stenosis    Past Surgical History:  Procedure Laterality Date  . BLADDER SUSPENSION  1993  . CARDIAC CATHETERIZATION N/A 02/28/2015   Procedure: Right/Left Heart Cath and Coronary Angiography;  Surgeon: Troy Sine, MD;  Location: [facility]UP[facility];  Service: Cardiovascular;  Laterality: N/A;  . CATARACT EXTRACTION Bilateral 2008  . CHOLECYSTECTOMY    . COLONOSCOPY    . COLONOSCOPY N/A 10/24/2014   Procedure: COLONOSCOPY;  Surgeon: Gatha Mayer, MD;   Location: [facility] ENDOS[facility];  Service: Endoscopy;  Laterality: N/A;  . CYST REMOVAL LEG Left 02/15/2016   cyst removal from back of left knee  . ELECTROPHYSIOLOGIC STUDY N/A 03/02/2015   Procedure: SVT Ablation;  Surgeon: Evans Lance, MD;  Location: [facility] CV [facility];  Service: Cardiovascular;  Laterality: N/A;  . INCISIONAL HERNIA REPAIR    . RIGHT HEART CATHETERIZATION N/A 07/04/2014   Procedure: RIGHT HEART CATH;  Surgeon: Jolaine Artist, MD;  Location: [facility] LAB;  Service: Cardiovascular;  Laterality: N/A;  . TONSILLECTOMY AND ADENOIDECTOMY    . TOTAL KNEE ARTHROPLASTY Left 2000  . TOTAL KNEE ARTHROPLASTY Left 10/29/2012   at [facility]  . TOTAL KNEE ARTHROPLASTY Right 03/09/2014  . TOTAL KNEE REVISION  01/12/2012   Procedure: TOTAL KNEE REVISION;  Surgeon: Kerin Salen, MD;  Location: [facility];  Service: Orthopedics;  Laterality: Left;  DEPUY/ LCS , HAND SET  . TRIGGER FINGER RELEASE  01/12/2012   Procedure: RELEASE TRIGGER FINGER/A-1 PULLEY;  Surgeon: Kerin Salen, MD;  Location: [place];  Service: Orthopedics;  Laterality: Right;   Social History  Substance Use Topics  . Smoking status: Never Smoker  . Smokeless tobacco: Never Used  . Alcohol use No   Family History  Problem Relation Age of Onset  . Breast cancer Mother   . Heart disease Father   . Diabetes Father   . Hypertension Father   . Uterine cancer Sister    Allergies  Allergen Reactions  . Prednisone Other (See Comments)    hyperctivity  . Zithromax [Azithromycin] Other (See Comments)    Prolonged QT on EKG  . Penicillins Hives and Swelling    Questionable allergy (?) Currently on cefazolin, tolerating well since 4/21 Patient is tolerating dicloxacillin without issue. No skin changes or other concerns.   . Sulfonamide Derivatives Hives  . Adhesive [Tape] Other (See Comments) and Rash    redness   Current Outpatient Prescriptions on File Prior to Visit  Medication Sig Dispense Refill  . benzonatate (TESSALON) 200 MG  capsule TAKE ONE CAPSULE BY MOUTH TWICE DAILY ASNEEDED FOR COUGH 180 capsule 3  . Calcium Carb-Cholecalciferol (CALCIUM 600/VITAMIN D3) 600-800 MG-UNIT TABS Take 1 tablet by mouth 2 (two) times daily.    . Cholecalciferol (VITAMIN D-1000 MAX ST) 1000 UNITS tablet Take 1,000 Units by mouth daily.    . hydrochlorothiazide (HYDRODIURIL) 25 MG tablet TAKE 1 TABLET DAILY 90 tablet 0  . metoprolol tartrate (LOPRESSOR) 25 MG tablet TAKE ONE-HALF (1/2) OF A TABLET BY MOUTHTWICE DAILY *PT DUE FOR APPT,MUST CALL TO SCHEDULE [phone]!* 15 tablet 0  . naproxen sodium (ANAPROX) 220 MG tablet Take 220 mg by mouth daily.     . OXYGEN Inhale 2 L into the lungs at bedtime.    Vladimir Faster Glycol-Propyl Glycol (SYSTANE) 0.4-0.3 % SOLN Apply  1 drop to eye 2 (two) times daily.    . polyethylene glycol (MIRALAX / GLYCOLAX) packet Take 17 g by mouth daily as needed (CONSTIPATOIN).      No current facility-administered medications on file prior to visit.     Review of Systems Review of Systems  Constitutional: Negative for fever, appetite change, fatigue and unexpected weight change.  Eyes: Negative for pain and visual disturbance.  Respiratory: Negative for cough and shortness of breath.   Cardiovascular: Negative for cp or palpitations    Gastrointestinal: Negative for nausea, and constipation. pos for occ urgent watery stool (sometimes with cramping) Genitourinary: Negative for urgency and frequency.  Skin: Negative for pallor or rash  pos for chronic infection of skin on knee (on chronic abx) Neurological: Negative for weakness, light-headedness, numbness and headaches.  Hematological: Negative for adenopathy. Does not bruise/bleed easily.  Psychiatric/Behavioral: Negative for dysphoric mood. The patient is not nervous/anxious.         Objective:   Physical Exam  Constitutional: She appears well-developed and well-nourished. No distress.  Well appearing   HENT:  Head: Normocephalic and atraumatic.    Mouth/Throat: Oropharynx is clear and moist.  Eyes: Pupils are equal, round, and reactive to light. Conjunctivae and EOM are normal. Right eye exhibits no discharge. Left eye exhibits no discharge. No scleral icterus.  Neck: Normal range of motion. Neck supple. No JVD present. Carotid bruit is not present. No thyromegaly present.  Cardiovascular: Normal rate, regular rhythm, normal heart sounds and intact distal pulses.  Exam reveals no gallop.   Pulmonary/Chest: Effort normal and breath sounds normal. No respiratory distress. She has no wheezes. She has no rales.  No crackles  Abdominal: Soft. Bowel sounds are normal. She exhibits no distension, no abdominal bruit and no mass. There is no hepatosplenomegaly. There is no tenderness. There is no rigidity, no rebound, no guarding, no CVA tenderness, no tenderness at McBurney's point and negative Murphy's sign.  Musculoskeletal: She exhibits no edema.  Knee wound is baseline  Lymphadenopathy:    She has no cervical adenopathy.  Neurological: She is alert. She has normal reflexes.  Skin: Skin is warm and dry. No rash noted. No pallor.  Psychiatric: Her behavior is normal. Thought content normal. Her mood appears anxious. Her affect is not blunt, not labile and not inappropriate. Her speech is not delayed and not tangential. She does not exhibit a depressed mood.  Mildly anxious but pleasant Supportive family present           Assessment & Plan:   Problem List Items Addressed This Visit      Other   Diarrhea - Primary    Urgent loose to watery stool w/o good bowel control In light of chronic abx -check c diff test Disc poss ibs rec trial of fiber supplement daily for bulk  Also disc poss of constipation with loose stool running by it abd xr today  c diff test today      Relevant Orders   DG Abd 2 Views (Completed)   C. difficile GDH and Toxin A/B   Memory loss    Per pt's family -she is having trouble with short term memory   Also poss mood issues  Will address in more detail at f/u (amw/PE)      Tearfulness    Per family intermittent sadness or anxiety Pt states she was not aware of this as much  Also ? Memory issues Will address further at upcoming appt and amw (will  have mini cog test) She is aware family is concerned

## 2017-06-28 DIAGNOSIS — R4589 Other symptoms and signs involving emotional state: Secondary | ICD-10-CM | POA: Insufficient documentation

## 2017-06-28 DIAGNOSIS — F0390 Unspecified dementia without behavioral disturbance: Secondary | ICD-10-CM | POA: Insufficient documentation

## 2017-06-28 NOTE — Assessment & Plan Note (Signed)
Per pt's family -she is having trouble with short term memory  Also poss mood issues  Will address in more detail at f/u (amw/PE)

## 2017-06-28 NOTE — Assessment & Plan Note (Signed)
Urgent loose to watery stool w/o good bowel control In light of chronic abx -check c diff test Disc poss ibs rec trial of fiber supplement daily for bulk  Also disc poss of constipation with loose stool running by it abd xr today  c diff test today

## 2017-06-28 NOTE — Assessment & Plan Note (Signed)
Per family intermittent sadness or anxiety Pt states she was not aware of this as much  Also ? Memory issues Will address further at upcoming appt and amw (will have mini cog test) She is aware family is concerned

## 2017-06-29 ENCOUNTER — Telehealth: Payer: Self-pay | Admitting: Family Medicine

## 2017-06-29 DIAGNOSIS — R7989 Other specified abnormal findings of blood chemistry: Secondary | ICD-10-CM

## 2017-06-29 DIAGNOSIS — I10 Essential (primary) hypertension: Secondary | ICD-10-CM

## 2017-06-29 DIAGNOSIS — R413 Other amnesia: Secondary | ICD-10-CM

## 2017-06-29 NOTE — Telephone Encounter (Signed)
-----   Message from Eustace Pen, LPN sent at 12/03/9483  3:47 PM EDT ----- Regarding: Labs 9/4 Please place lab orders.   [facility] Medicare

## 2017-06-30 ENCOUNTER — Ambulatory Visit (INDEPENDENT_AMBULATORY_CARE_PROVIDER_SITE_OTHER): Payer: Medicare Other

## 2017-06-30 ENCOUNTER — Other Ambulatory Visit: Payer: Medicare Other

## 2017-06-30 VITALS — BP 110/60 | HR 66 | Temp 98.3°F | Ht 65.5 in | Wt 214.2 lb

## 2017-06-30 DIAGNOSIS — Z0000 Encounter for general adult medical examination without abnormal findings: Secondary | ICD-10-CM | POA: Diagnosis not present

## 2017-06-30 DIAGNOSIS — R413 Other amnesia: Secondary | ICD-10-CM

## 2017-06-30 DIAGNOSIS — I10 Essential (primary) hypertension: Secondary | ICD-10-CM | POA: Diagnosis not present

## 2017-06-30 DIAGNOSIS — R0600 Dyspnea, unspecified: Secondary | ICD-10-CM | POA: Diagnosis not present

## 2017-06-30 DIAGNOSIS — R946 Abnormal results of thyroid function studies: Secondary | ICD-10-CM

## 2017-06-30 DIAGNOSIS — R7989 Other specified abnormal findings of blood chemistry: Secondary | ICD-10-CM

## 2017-06-30 DIAGNOSIS — R197 Diarrhea, unspecified: Secondary | ICD-10-CM

## 2017-06-30 LAB — CBC WITH DIFFERENTIAL/PLATELET
BASOS ABS: 0.1 10*3/uL (ref 0.0–0.1)
BASOS PCT: 1 % (ref 0.0–3.0)
Eosinophils Absolute: 0.2 10*3/uL (ref 0.0–0.7)
Eosinophils Relative: 3.3 % (ref 0.0–5.0)
HCT: 43.9 % (ref 36.0–46.0)
Hemoglobin: 14.2 g/dL (ref 12.0–15.0)
Lymphocytes Relative: 19 % (ref 12.0–46.0)
Lymphs Abs: 1 10*3/uL (ref 0.7–4.0)
MCHC: 32.4 g/dL (ref 30.0–36.0)
MCV: 92.4 fl (ref 78.0–100.0)
MONO ABS: 0.5 10*3/uL (ref 0.1–1.0)
Monocytes Relative: 9.9 % (ref 3.0–12.0)
NEUTROS ABS: 3.4 10*3/uL (ref 1.4–7.7)
Neutrophils Relative %: 66.8 % (ref 43.0–77.0)
PLATELETS: 230 10*3/uL (ref 150.0–400.0)
RBC: 4.75 Mil/uL (ref 3.87–5.11)
RDW: 14.2 % (ref 11.5–15.5)
WBC: 5 10*3/uL (ref 4.0–10.5)

## 2017-06-30 LAB — VITAMIN B12: Vitamin B-12: 237 pg/mL (ref 211–911)

## 2017-06-30 LAB — LIPID PANEL
CHOL/HDL RATIO: 3
Cholesterol: 167 mg/dL (ref 0–200)
HDL: 50.3 mg/dL (ref >39.00)
LDL CALC: 97 mg/dL (ref 0–99)
NONHDL: 116.92
TRIGLYCERIDES: 100 mg/dL (ref 0.0–149.0)
VLDL: 20 mg/dL (ref 0.0–40.0)

## 2017-06-30 LAB — T4, FREE: FREE T4: 0.87 ng/dL (ref 0.60–1.60)

## 2017-06-30 LAB — COMPREHENSIVE METABOLIC PANEL
ALT: 11 U/L (ref 0–35)
AST: 20 U/L (ref 0–37)
Albumin: 3.8 g/dL (ref 3.5–5.2)
Alkaline Phosphatase: 65 U/L (ref 39–117)
BUN: 20 mg/dL (ref 6–23)
CHLORIDE: 99 meq/L (ref 96–112)
CO2: 35 meq/L — AB (ref 19–32)
CREATININE: 1.05 mg/dL (ref 0.40–1.20)
Calcium: 9.8 mg/dL (ref 8.4–10.5)
GFR: 53.38 mL/min — ABNORMAL LOW (ref >60.00)
GLUCOSE: 125 mg/dL — AB (ref 70–99)
Potassium: 3.8 mEq/L (ref 3.5–5.1)
SODIUM: 142 meq/L (ref 135–145)
Total Bilirubin: 0.5 mg/dL (ref 0.2–1.2)
Total Protein: 6.9 g/dL (ref 6.0–8.3)

## 2017-06-30 LAB — TSH: TSH: 5.21 u[IU]/mL — ABNORMAL HIGH (ref 0.35–4.50)

## 2017-06-30 NOTE — Patient Instructions (Signed)
[name] , Thank you for taking time to come for your Medicare Wellness Visit. I appreciate your ongoing commitment to your health goals. Please review the following plan we discussed and let me know if I can assist you in the future.   These are the goals we discussed: Goals    . Increase water intake          Starting 06/30/2017, I will attempt to drink at least 6-8 glasses of water daily.        This is a list of the screening recommended for you and due dates:  Health Maintenance  Topic Date Due  . Flu Shot  01/24/2018*  . Mammogram  07/18/2017  . Tetanus Vaccine  02/19/2022  . DEXA scan (bone density measurement)  Completed  . Pneumonia vaccines  Completed  *Topic was postponed. The date shown is not the original due date.   Preventive Care for Adults  A healthy lifestyle and preventive care can promote health and wellness. Preventive health guidelines for adults include the following key practices.  . A routine yearly physical is a good way to check with your health care provider about your health and preventive screening. It is a chance to share any concerns and updates on your health and to receive a thorough exam.  . Visit your dentist for a routine exam and preventive care every 6 months. Brush your teeth twice a day and floss once a day. Good oral hygiene prevents tooth decay and gum disease.  . The frequency of eye exams is based on your age, health, family medical history, use  of contact lenses, and other factors. Follow your health care provider's ecommendations for frequency of eye exams.  . Eat a healthy diet. Foods like vegetables, fruits, whole grains, low-fat dairy products, and lean protein foods contain the nutrients you need without too many calories. Decrease your intake of foods high in solid fats, added sugars, and salt. Eat the right amount of calories for you. Get information about a proper diet from your health care provider, if necessary.  . Regular  physical exercise is one of the most important things you can do for your health. Most adults should get at least 150 minutes of moderate-intensity exercise (any activity that increases your heart rate and causes you to sweat) each week. In addition, most adults need muscle-strengthening exercises on 2 or more days a week.  Silver Sneakers may be a benefit available to you. To determine eligibility, you may visit the website: www.silversneakers.com or contact program at 5158341693 Mon-Fri between 8AM-8PM.   . Maintain a healthy weight. The body mass index (BMI) is a screening tool to identify possible weight problems. It provides an estimate of body fat based on height and weight. Your health care provider can find your BMI and can help you achieve or maintain a healthy weight.   For adults 20 years and older: ? A BMI below 18.5 is considered underweight. ? A BMI of 18.5 to 24.9 is normal. ? A BMI of 25 to 29.9 is considered overweight. ? A BMI of 30 and above is considered obese.   . Maintain normal blood lipids and cholesterol levels by exercising and minimizing your intake of saturated fat. Eat a balanced diet with plenty of fruit and vegetables. Blood tests for lipids and cholesterol should begin at age 7 and be repeated every 5 years. If your lipid or cholesterol levels are high, you are over 50, or you are at high  risk for heart disease, you may need your cholesterol levels checked more frequently. Ongoing high lipid and cholesterol levels should be treated with medicines if diet and exercise are not working.  . If you smoke, find out from your health care provider how to quit. If you do not use tobacco, please do not start.  . If you choose to drink alcohol, please do not consume more than 2 drinks per day. One drink is considered to be 12 ounces (355 mL) of beer, 5 ounces (148 mL) of wine, or 1.5 ounces (44 mL) of liquor.  . If you are 11-35 years old, ask your health care provider if  you should take aspirin to prevent strokes.  . Use sunscreen. Apply sunscreen liberally and repeatedly throughout the day. You should seek shade when your shadow is shorter than you. Protect yourself by wearing long sleeves, pants, a wide-brimmed hat, and sunglasses year round, whenever you are outdoors.  . Once a month, do a whole body skin exam, using a mirror to look at the skin on your back. Tell your health care provider of new moles, moles that have irregular borders, moles that are larger than a pencil eraser, or moles that have changed in shape or color.

## 2017-06-30 NOTE — Progress Notes (Signed)
Subjective:   [name] is a 83 y.o. female who presents for Medicare Annual (Subsequent) preventive examination.  Review of Systems:  N/A Cardiac Risk Factors include: advanced age (>102men, >89 women);obesity (BMI >30kg/m2);hypertension;dyslipidemia     Objective:     Vitals: BP 110/60 (BP Location: Right Arm, Patient Position: Sitting, Cuff Size: Large)   Pulse 66   Temp 98.3 F (36.8 C) (Oral)   Ht 5' 5.5" (1.664 m) Comment: no shoes  Wt 214 lb 4 oz (97.2 kg)   SpO2 94%   BMI 35.11 kg/m   Body mass index is 35.11 kg/m.   Tobacco History  Smoking Status  . Never Smoker  Smokeless Tobacco  . Never Used     Counseling given: No   Past Medical History:  Diagnosis Date  . Anemia   . Arthritis    knee  . Diverticulosis   . Hx of colonic polyps 10/24/2014  . Hyperlipidemia   . Hypertension   . Osteopenia   . Overactive bladder    urge incontinence  . PSVT (paroxysmal supraventricular tachycardia) ([place])   . Pulmonary emboli ([place])   . Pulmonary hypertension ([place])   . Right ventricular failure ([place])   . Sleep apnea    CPAP  . Spinal stenosis    Past Surgical History:  Procedure Laterality Date  . BLADDER SUSPENSION  1993  . CARDIAC CATHETERIZATION N/A 02/28/2015   Procedure: Right/Left Heart Cath and Coronary Angiography;  Surgeon: Troy Sine, MD;  Location: [place] INVASIVE CV LAB CUPID;  Service: Cardiovascular;  Laterality: N/A;  . CATARACT EXTRACTION Bilateral 2008  . CHOLECYSTECTOMY    . COLONOSCOPY    . COLONOSCOPY N/A 10/24/2014   Procedure: COLONOSCOPY;  Surgeon: Gatha Mayer, MD;  Location: [facility];  Service: Endoscopy;  Laterality: N/A;  . CYST REMOVAL LEG Left 02/15/2016   cyst removal from back of left knee  . ELECTROPHYSIOLOGIC STUDY N/A 03/02/2015   Procedure: SVT Ablation;  Surgeon: Evans Lance, MD;  Location: [place] CV LAB;  Service: Cardiovascular;  Laterality: N/A;  . INCISIONAL HERNIA REPAIR    . RIGHT HEART  CATHETERIZATION N/A 07/04/2014   Procedure: RIGHT HEART CATH;  Surgeon: Jolaine Artist, MD;  Location: [facility] CATH LAB;  Service: Cardiovascular;  Laterality: N/A;  . TONSILLECTOMY AND ADENOIDECTOMY    . TOTAL KNEE ARTHROPLASTY Left 2000  . TOTAL KNEE ARTHROPLASTY Left 10/29/2012   at [facility]  . TOTAL KNEE ARTHROPLASTY Right 03/09/2014  . TOTAL KNEE REVISION  01/12/2012   Procedure: TOTAL KNEE REVISION;  Surgeon: Kerin Salen, MD;  Location: [place];  Service: Orthopedics;  Laterality: Left;  DEPUY/ LCS , HAND SET  . TRIGGER FINGER RELEASE  01/12/2012   Procedure: RELEASE TRIGGER FINGER/A-1 PULLEY;  Surgeon: Kerin Salen, MD;  Location: [place];  Service: Orthopedics;  Laterality: Right;   Family History  Problem Relation Age of Onset  . Breast cancer Mother   . Heart disease Father   . Diabetes Father   . Hypertension Father   . Uterine cancer Sister    History  Sexual Activity  . Sexual activity: No    Outpatient Encounter Prescriptions as of 06/30/2017  Medication Sig  . benzonatate (TESSALON) 200 MG capsule TAKE ONE CAPSULE BY MOUTH TWICE DAILY ASNEEDED FOR COUGH (Patient taking differently: Take 200 mg by mouth daily. TAKE ONE CAPSULE BY MOUTH TWICE DAILY ASNEEDED FOR COUGH)  . Calcium Carb-Cholecalciferol (CALCIUM 600/VITAMIN D3) 600-800 MG-UNIT TABS Take  1 tablet by mouth 2 (two) times daily.  . Cholecalciferol (VITAMIN D-1000 MAX ST) 1000 UNITS tablet Take 1,000 Units by mouth daily.  . hydrochlorothiazide (HYDRODIURIL) 25 MG tablet TAKE 1 TABLET DAILY  . metoprolol tartrate (LOPRESSOR) 25 MG tablet TAKE ONE-HALF (1/2) OF A TABLET BY MOUTHTWICE DAILY *PT DUE FOR APPT,MUST CALL TO SCHEDULE [phone]!*  . naproxen sodium (ANAPROX) 220 MG tablet Take 220 mg by mouth daily.   . OXYGEN Inhale 2 L into the lungs at bedtime.  Vladimir Faster Glycol-Propyl Glycol (SYSTANE) 0.4-0.3 % SOLN Apply 1 drop to eye 2 (two) times daily.  . polyethylene glycol (MIRALAX / GLYCOLAX) packet Take 17 g by mouth  daily as needed (CONSTIPATOIN).    No facility-administered encounter medications on file as of 06/30/2017.     Activities of Daily Living In your present state of health, do you have any difficulty performing the following activities: 06/30/2017  Hearing? Y  Vision? N  Difficulty concentrating or making decisions? Y  Walking or climbing stairs? Y  Dressing or bathing? N  Doing errands, shopping? Y  Preparing Food and eating ? Y  Using the Toilet? N  In the past six months, have you accidently leaked urine? Y  Do you have problems with loss of bowel control? Y  Managing your Medications? Y  Managing your Finances? Y  Housekeeping or managing your Housekeeping? Y  Some recent data might be hidden    Patient Care Team: Tower, Wynelle Fanny, MD as PCP - General    Assessment:     Hearing Screening   125Hz  250Hz  500Hz  1000Hz  2000Hz  3000Hz  4000Hz  6000Hz  8000Hz   Right ear:   0 0 40  40    Left ear:   0 0 40  40    Vision Screening Comments: Last vision exam in August 2018 with Dr. Kathrin Penner   Exercise Activities and Dietary recommendations Current Exercise Habits: The patient does not participate in regular exercise at present, Exercise limited by: orthopedic condition(s)  Goals    . Increase water intake          Starting 06/30/2017, I will attempt to drink at least 6-8 glasses of water daily.       Fall Risk Fall Risk  06/30/2017 06/20/2016 07/14/2014 02/08/2013  Falls in the past year? No Yes No No  Number falls in past yr: - 1 - -  Injury with Fall? - No - -  Risk for fall due to : - History of fall(s);Impaired balance/gait;Impaired mobility - -  Follow up - Falls evaluation completed - -   Depression Screen PHQ 2/9 Scores 06/30/2017 06/20/2016 07/14/2014 02/08/2013  PHQ - 2 Score 1 1 0 0  PHQ- 9 Score 6 - - -     Cognitive Function MMSE - Mini Mental State Exam 06/30/2017 06/20/2016  Orientation to time 5 5  Orientation to Place 5 5  Registration 3 3  Attention/ Calculation 0  0  Recall 3 3  Language- name 2 objects 0 0  Language- repeat 1 1  Language- follow 3 step command 3 3  Language- read & follow direction 0 0  Write a sentence 0 0  Copy design 0 0  Total score 20 20       PLEASE NOTE: A Mini-Cog screen was completed. Maximum score is 20. A value of 0 denotes this part of [place] MMSE was not completed or the patient failed this part of the Mini-Cog screening.   Mini-Cog Screening Orientation to  Time - Max 5 pts Orientation to Place - Max 5 pts Registration - Max 3 pts Recall - Max 3 pts Language Repeat - Max 1 pts Language Follow 3 Step Command - Max 3 pts   Immunization History  Administered Date(s) Administered  . Influenza Split 07/29/2011  . Influenza Whole 08/14/2005, 07/28/2007, 07/14/2008, 07/26/2009, 07/16/2010  . Influenza,inj,Quad PF,6+ Mos 07/19/2013, 07/14/2014, 07/11/2015, 06/25/2016  . Pneumococcal Conjugate-13 07/14/2014  . Pneumococcal Polysaccharide-23 06/25/2006, 08/15/2010  . Td 03/27/2004, 02/20/2012  . Tdap 02/20/2012  . Zoster 11/01/2007   Screening Tests Health Maintenance  Topic Date Due  . INFLUENZA VACCINE  01/24/2018 (Originally 05/27/2017)  . MAMMOGRAM  07/18/2017  . TETANUS/TDAP  02/19/2022  . DEXA SCAN  Completed  . PNA vac Low Risk Adult  Completed      Plan:     I have personally reviewed and addressed the Medicare Annual Wellness questionnaire and have noted the following in the patient's chart:  A. Medical and social history B. Use of alcohol, tobacco or illicit drugs  C. Current medications and supplements D. Functional ability and status E.  Nutritional status F.  Physical activity G. Advance directives H. List of other physicians I.  Hospitalizations, surgeries, and ER visits in previous 12 months J.  [place] to include hearing, vision, cognitive, depression L. Referrals and appointments - none  In addition, I have reviewed and discussed with patient certain preventive  protocols, quality metrics, and best practice recommendations. A written personalized care plan for preventive services as well as general preventive health recommendations were provided to patient.  See attached scanned questionnaire for additional information.   Signed,   Lindell Noe, MHA, BS, LPN Health Coach

## 2017-06-30 NOTE — Progress Notes (Signed)
PCP notes:   Health maintenance:  Flu vaccine - PCP please address at next appt  Abnormal screenings:   Hearing - failed Depression score: 6  Patient concerns:   Patient reports concerns with feet turning "purple" and becoming swollen. Patient states elevating feet is effective in decreasing swelling.  Nurse concerns:  None  Next PCP appt:   07/03/17 @ 1045  I reviewed health advisor's note, was available for consultation, and agree with documentation and plan. Loura Pardon MD

## 2017-06-30 NOTE — Progress Notes (Signed)
Pre visit review using our clinic review tool, if applicable. No additional management support is needed unless otherwise documented below in the visit note. 

## 2017-07-01 NOTE — Addendum Note (Signed)
Addended by: Ellamae Sia on: 07/01/2017 09:48 AM   Modules accepted: Orders

## 2017-07-02 LAB — C. DIFFICILE GDH AND TOXIN A/B
GDH ANTIGEN: NOT DETECTED
MICRO NUMBER: 80974103
SPECIMEN QUALITY: ADEQUATE
TOXIN A AND B: NOT DETECTED

## 2017-07-03 ENCOUNTER — Other Ambulatory Visit: Payer: Self-pay | Admitting: Family Medicine

## 2017-07-03 ENCOUNTER — Encounter: Payer: Self-pay | Admitting: Family Medicine

## 2017-07-03 ENCOUNTER — Ambulatory Visit (INDEPENDENT_AMBULATORY_CARE_PROVIDER_SITE_OTHER): Payer: Medicare Other | Admitting: Family Medicine

## 2017-07-03 VITALS — BP 116/64 | HR 69 | Temp 98.1°F | Ht 65.5 in | Wt 212.2 lb

## 2017-07-03 DIAGNOSIS — R413 Other amnesia: Secondary | ICD-10-CM | POA: Diagnosis not present

## 2017-07-03 DIAGNOSIS — Z1231 Encounter for screening mammogram for malignant neoplasm of breast: Secondary | ICD-10-CM | POA: Insufficient documentation

## 2017-07-03 DIAGNOSIS — R197 Diarrhea, unspecified: Secondary | ICD-10-CM

## 2017-07-03 DIAGNOSIS — Z0000 Encounter for general adult medical examination without abnormal findings: Secondary | ICD-10-CM

## 2017-07-03 DIAGNOSIS — I10 Essential (primary) hypertension: Secondary | ICD-10-CM | POA: Diagnosis not present

## 2017-07-03 DIAGNOSIS — Z0110 Encounter for examination of ears and hearing without abnormal findings: Secondary | ICD-10-CM

## 2017-07-03 DIAGNOSIS — F430 Acute stress reaction: Secondary | ICD-10-CM

## 2017-07-03 DIAGNOSIS — Z23 Encounter for immunization: Secondary | ICD-10-CM

## 2017-07-03 DIAGNOSIS — I272 Pulmonary hypertension, unspecified: Secondary | ICD-10-CM

## 2017-07-03 DIAGNOSIS — E039 Hypothyroidism, unspecified: Secondary | ICD-10-CM

## 2017-07-03 DIAGNOSIS — E785 Hyperlipidemia, unspecified: Secondary | ICD-10-CM

## 2017-07-03 DIAGNOSIS — E538 Deficiency of other specified B group vitamins: Secondary | ICD-10-CM | POA: Insufficient documentation

## 2017-07-03 DIAGNOSIS — T8454XS Infection and inflammatory reaction due to internal left knee prosthesis, sequela: Secondary | ICD-10-CM

## 2017-07-03 MED ORDER — CYANOCOBALAMIN 1000 MCG/ML IJ SOLN
1000.0000 ug | Freq: Once | INTRAMUSCULAR | Status: AC
  Administered 2017-07-03: 1000 ug via INTRAMUSCULAR

## 2017-07-03 MED ORDER — FLUOXETINE HCL 10 MG PO CAPS: 10.0000 mg | ORAL_CAPSULE | Freq: Every day | ORAL | 11 refills | Status: DC

## 2017-07-03 NOTE — Patient Instructions (Addendum)
We will refer you to GI and audiology and counseling and mammogram   Try the prozac 10 mg daily  If side effects or you feel worse stop it and let me know   Flu shot today    Once you have the diarrhea issue taken care of - we may consider a small dose of thyroid hormone   Your B12 level is on the low side  B12 shot today  Get vitamin B12 over the counter and take 1000 mcg every day

## 2017-07-03 NOTE — Progress Notes (Signed)
Subjective:    Patient ID: [name], female    DOB: 1934/01/28, 83 y.o.   MRN: [id number]  HPI Here for health maintenance exam and to review chronic medical problems    Had amw 9/4 Failed hearing -lower tones Utd eye care No falls  Depression screen 7 Nl mini cog (family is concerned about memory)   She has labile mood/tearfulness  Family notices it  Has seen counselor    Was here last for diarrhea Nl labs Nl c diff test Still having loose bm and cannot control them    Wt Readings from Last 3 Encounters:  07/03/17 212 lb 4 oz (96.3 kg)  06/30/17 214 lb 4 oz (97.2 kg)  06/26/17 214 lb 4 oz (97.2 kg)  down 2 lb  Appetite is not great  Trying to drink more water  34.78 kg/m  Flu shot -will get today   Mammogram 9/17 nl - she would like [place] to make the appt  Self breast exam -no lumps   dexa 10/13 No falls or fractures  Takes ca and D   Zostavax 1/09  Colonoscopy 12/15  No recall due to age Will be seeing GI for diarrhea   bp is stable today  No cp or palpitations or headaches or edema  No side effects to medicines  BP Readings from Last 3 Encounters:  07/03/17 116/64  06/30/17 110/60  06/26/17 128/70      Lab Results  Component Value Date   CREATININE 1.05 06/30/2017   BUN 20 06/30/2017   NA 142 06/30/2017   K 3.8 06/30/2017   CL 99 06/30/2017   CO2 35 (H) 06/30/2017   Lab Results  Component Value Date   ALT 11 06/30/2017   AST 20 06/30/2017   ALKPHOS 65 06/30/2017   BILITOT 0.5 06/30/2017   Lab Results  Component Value Date   WBC 5.0 06/30/2017   HGB 14.2 06/30/2017   HCT 43.9 06/30/2017   MCV 92.4 06/30/2017   PLT 230.0 06/30/2017   Glucose was 125 (non fasting)   Hyperlipidemia Lab Results  Component Value Date   CHOL 167 06/30/2017   CHOL 162 06/20/2016   CHOL 104 03/14/2014   Lab Results  Component Value Date   HDL 50.30 06/30/2017   HDL 52.90 06/20/2016   HDL 32 (L) 03/14/2014   Lab Results  Component Value  Date   LDLCALC 97 06/30/2017   LDLCALC 87 06/20/2016   LDLCALC 53 03/14/2014   Lab Results  Component Value Date   TRIG 100.0 06/30/2017   TRIG 109.0 06/20/2016   TRIG 95 03/14/2014   Lab Results  Component Value Date   CHOLHDL 3 06/30/2017   CHOLHDL 3 06/20/2016   CHOLHDL 3.3 03/14/2014   No results found for: LDLDIRECT  Good cholesterol profile    TSH was high Lab Results  Component Value Date   TSH 5.21 (H) 06/30/2017   free T4 is 0.87 in the nl rang  B12 is on the low side  Lab Results  Component Value Date   VITAMINB12 237 06/30/2017    Patient Active Problem List   Diagnosis Date Noted  . Hypothyroid 07/03/2017  . B12 deficiency 07/03/2017  . Encounter for audiology evaluation 07/03/2017  . Encounter for screening mammogram for breast cancer 07/03/2017  . Tearfulness 06/28/2017  . Memory loss 06/28/2017  . Poor balance 09/26/2016  . Neck pain 07/30/2016  . Diarrhea 07/30/2016  . Routine general medical examination at a health  care facility 06/26/2016  . Elevated TSH 06/25/2016  . Stress reaction 06/04/2016  . Intertrigo 06/04/2016  . Chronic cough 05/01/2016  . Constipation 01/09/2016  . Umbilical hernia 27/78/2423  . Chronic respiratory failure ([place]) 10/05/2015  . Thromboembolism of vein 10/02/2015  . Infection of prosthetic joint ([place]) 09/18/2015  . Infection of prosthetic left knee joint ([place]) 09/12/2015  . Effusion of knee 08/30/2015  . Pulmonary hypertension ([place])   . Physical deconditioning 02/25/2015  . RBBB-new 02/12/2015  . History of bilat PE after TKR (off Coumadin Dec 2015) 02/12/2015  . Abnormal EKG-TWI, RBBB, prolonged QT   . Hernia, umbilical 53/61/4431  . Hx of colonic polyps 10/24/2014  . Diverticulosis of colon without hemorrhage   . OSA (obstructive sleep apnea) 07/22/2014  . Unspecified arthropathy, lower leg 03/21/2014  . History of right knee joint replacement 03/15/2014  . Arthritis of knee 03/09/2014  . Solitary  pulmonary nodule 05/03/2013  . Sternal fracture 04/26/2013  . PE (pulmonary embolism) 11/24/2012  . History of DVT (deep vein thrombosis) 11/24/2012  . History of artificial joint 10/29/2012  . 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 BP (high blood pressure) 10/14/2012  . Complications due to internal joint prosthesis ([place]) 09/07/2012  . PES PLANUS 08/15/2010  . URINARY FREQUENCY, CHRONIC 07/14/2008  . Dyslipidemia 07/09/2007  . Essential hypertension 07/09/2007  . PSVT with SSS 02/23/2007  . SCLERODERMA 02/23/2007  . DEGENERATIVE JOINT DISEASE 02/23/2007  . SPINAL STENOSIS, LUMBAR 02/23/2007  . History of osteopenia 02/23/2007  . SPONDYLOLISTHESIS 02/23/2007   Past Medical History:  Diagnosis Date  . Anemia   . Arthritis    knee  . Diverticulosis   . Hx of colonic polyps 10/24/2014  . Hyperlipidemia   . Hypertension   . Osteopenia   . Overactive bladder    urge incontinence  . PSVT (paroxysmal supraventricular tachycardia) ([place])   . Pulmonary emboli ([place])   . Pulmonary hypertension ([place])   . Right ventricular failure ([place])   . Sleep apnea    CPAP  . Spinal stenosis    Past Surgical History:  Procedure Laterality Date  . BLADDER SUSPENSION  1993  . CARDIAC CATHETERIZATION N/A 02/28/2015   Procedure: Right/Left Heart Cath and Coronary Angiography;  Surgeon: Troy Sine, MD;  Location: [place] INVASIVE CV LAB CUPID;  Service: Cardiovascular;  Laterality: N/A;  . CATARACT EXTRACTION Bilateral 2008  . CHOLECYSTECTOMY    . COLONOSCOPY    . COLONOSCOPY N/A 10/24/2014   Procedure: COLONOSCOPY;  Surgeon: Gatha Mayer, MD;  [facility];  Service: Endoscopy;  Laterality: N/A;  . CYST REMOVAL LEG Left 02/15/2016   cyst removal from back of left knee  . ELECTROPHYSIOLOGIC STUDY N/A 03/02/2015   Procedure: SVT Ablation;  Surgeon: Evans Lance, MD;  Location: [place] CV LAB;  Service: Cardiovascular;  Laterality: N/A;  . INCISIONAL HERNIA REPAIR    . RIGHT  HEART CATHETERIZATION N/A 07/04/2014   Procedure: RIGHT HEART CATH;  Surgeon: Jolaine Artist, MD;  Location: [facility] CATH LAB;  Service: Cardiovascular;  Laterality: N/A;  . TONSILLECTOMY AND ADENOIDECTOMY    . TOTAL KNEE ARTHROPLASTY Left 2000  . TOTAL KNEE ARTHROPLASTY Left 10/29/2012   at [facility]  . TOTAL KNEE ARTHROPLASTY Right 03/09/2014  . TOTAL KNEE REVISION  01/12/2012   Procedure: TOTAL KNEE REVISION;  Surgeon: Kerin Salen, MD;  Location: [place];  Service: Orthopedics;  Laterality: Left;  DEPUY/ LCS , HAND SET  . TRIGGER FINGER RELEASE  01/12/2012   Procedure: RELEASE TRIGGER FINGER/A-1 PULLEY;  Surgeon: Kerin Salen, MD;  Location: [place];  Service: Orthopedics;  Laterality: Right;   Social History  Substance Use Topics  . Smoking status: Never Smoker  . Smokeless tobacco: Never Used  . Alcohol use No   Family History  Problem Relation Age of Onset  . Breast cancer Mother   . Heart disease Father   . Diabetes Father   . Hypertension Father   . Uterine cancer Sister    Allergies  Allergen Reactions  . Prednisone Other (See Comments)    hyperctivity  . Zithromax [Azithromycin] Other (See Comments)    Prolonged QT on EKG  . Penicillins Hives and Swelling    Questionable allergy (?) Currently on cefazolin, tolerating well since 4/21 Patient is tolerating dicloxacillin without issue. No skin changes or other concerns.   . Sulfonamide Derivatives Hives  . Adhesive [Tape] Other (See Comments) and Rash    redness   Current Outpatient Prescriptions on File Prior to Visit  Medication Sig Dispense Refill  . benzonatate (TESSALON) 200 MG capsule TAKE ONE CAPSULE BY MOUTH TWICE DAILY ASNEEDED FOR COUGH (Patient taking differently: Take 200 mg by mouth daily. TAKE ONE CAPSULE BY MOUTH TWICE DAILY ASNEEDED FOR COUGH) 180 capsule 3  . Calcium Carb-Cholecalciferol (CALCIUM 600/VITAMIN D3) 600-800 MG-UNIT TABS Take 1 tablet by mouth 2 (two) times daily.    . Cholecalciferol (VITAMIN D-1000  MAX ST) 1000 UNITS tablet Take 1,000 Units by mouth daily.    . hydrochlorothiazide (HYDRODIURIL) 25 MG tablet TAKE 1 TABLET DAILY 90 tablet 0  . metoprolol tartrate (LOPRESSOR) 25 MG tablet TAKE ONE-HALF (1/2) OF A TABLET BY MOUTHTWICE DAILY *PT DUE FOR APPT,MUST CALL TO SCHEDULE [phone]!* 15 tablet 0  . naproxen sodium (ANAPROX) 220 MG tablet Take 220 mg by mouth daily.     . OXYGEN Inhale 2 L into the lungs at bedtime.    Vladimir Faster Glycol-Propyl Glycol (SYSTANE) 0.4-0.3 % SOLN Apply 1 drop to eye 2 (two) times daily.    . polyethylene glycol (MIRALAX / GLYCOLAX) packet Take 17 g by mouth daily as needed (CONSTIPATOIN).      No current facility-administered medications on file prior to visit.     Review of Systems    Review of Systems  Constitutional: Negative for fever, appetite change,  and unexpected weight change.  Eyes: Negative for pain and visual disturbance.  ENT pos for hearing loss Respiratory: Negative for cough and shortness of breath.   Cardiovascular: Negative for cp or palpitations    Gastrointestinal: Negative for nausea, abd pain and constipation. neg for blood in stool Genitourinary: Negative for urgency and frequency.  Skin: Negative for pallor or rash   Neurological: Negative for weakness, light-headedness, numbness and headaches.  Hematological: Negative for adenopathy. Does not bruise/bleed easily.  Psychiatric/Behavioral: pos for labile mood with tearfulness and anxiety , pos for short term memory difficulties     Objective:   Physical Exam  Constitutional: She appears well-developed and well-nourished. No distress.  obese and well appearing   HENT:  Head: Normocephalic and atraumatic.  Right Ear: External ear normal.  Left Ear: External ear normal.  Mouth/Throat: Oropharynx is clear and moist.  Eyes: Pupils are equal, round, and reactive to light. Conjunctivae and EOM are normal. No scleral icterus.  Neck: Normal range of motion. Neck supple. No  JVD present. Carotid bruit is not present. No thyromegaly present.  Cardiovascular: Normal rate, regular rhythm, normal heart  sounds and intact distal pulses.  Exam reveals no gallop.   Pulmonary/Chest: Effort normal and breath sounds normal. No respiratory distress. She has no wheezes. She exhibits no tenderness.  Good air exch  Abdominal: Soft. Bowel sounds are normal. She exhibits no distension, no abdominal bruit and no mass. There is no tenderness.  Genitourinary: No breast swelling, tenderness, discharge or bleeding.  Genitourinary Comments: Breast exam: No mass, nodules, thickening, tenderness, bulging, retraction, inflamation, nipple discharge or skin changes noted.  No axillary or clavicular LA.      Musculoskeletal: Normal range of motion. She exhibits no edema or tenderness.  Lymphadenopathy:    She has no cervical adenopathy.  Neurological: She is alert. She has normal reflexes. No cranial nerve deficit. She exhibits normal muscle tone. Coordination normal.  Skin: Skin is warm and dry. No rash noted. No erythema. No pallor.  Some lentigines sks diffusely  Psychiatric: She has a normal mood and affect.  Pleasant today  Tearful at times when discussing mood  Fairly mentally sharp Supportive family present           Assessment & Plan:   Problem List Items Addressed This Visit      Cardiovascular and Mediastinum   Essential hypertension   Pulmonary hypertension (HCC)    No change in breathing/ endurance          Endocrine   Hypothyroid    Mild elevation of TSH with nl FT4 and some mild memory and depressive sympotms  May benefit from low dose levothyroxine - but not until diarrhea is resolved  Will continue to follow F/u made         Musculoskeletal and Integument   Infection of prosthetic left knee joint ([place])    Continues keflex  Ongoing         Other   B12 deficiency    New B12 shot  Then oral supl with 1000 mcg daily  Hope this will help energy  and memory issues       Relevant Medications   cyanocobalamin ((VITAMIN B-12)) injection 1,000 mcg (Completed)   Diarrhea    Ongoing  Neg c diff  Neg stool cx  On chronic abx  Asked to try probiotics  ? Bact overgrowth or ibs Ref to GI      Relevant Orders   Ambulatory referral to Gastroenterology   Dyslipidemia    Disc goals for lipids and reasons to control them Rev labs with pt Rev low sat fat diet in detail  Good profile with diet control today      Encounter for audiology evaluation    Ref for symptomatic hearing loss       Relevant Orders   Ambulatory referral to Audiology   Encounter for screening mammogram for breast cancer    Scheduled annual screening mammogram Nl breast exam today  Encouraged monthly self exams         Memory loss    This may be pseudodementia from her depression and thyroid issue  Not completely sure  Did fine on mini cog Start counseling and prozac  Consider levothyroxine when diarrhea is better  Re eval at f/u (follow closely)       Routine general medical examination at a health care facility - Primary    Reviewed health habits including diet and exercise and skin cancer prevention Reviewed appropriate screening tests for age  Also reviewed health mt list, fam hx and immunization status , as well as social and family history  See HPI Ref made for GI/ audiology/counseling and mammogram  Will hold off on dexa until these are done  amw rev  Labs rev  Flu shot today      Stress reaction    With tearfulness and dep/anx symptoms  Reviewed stressors/ coping techniques/symptoms/ support sources/ tx options and side effects in detail today  This may greatly affect memory as well  Plan to try prozac 10 mg  Discussed expectations of SSRI medication including time to effectiveness and mechanism of action, also poss of side effects (early and late)- including mental fuzziness, weight or appetite change, nausea and poss of worse dep or  anxiety (even suicidal thoughts)  Pt voiced understanding and will stop med and update if this occurs   Also ref for counseling  F/u planned  Disc role of thyroid- (elevated tsh)- consider tx once diarrhea is better       Relevant Orders   Ambulatory referral to Psychology    Other Visit Diagnoses    Need for influenza vaccination       Relevant Orders   Flu Vaccine QUAD 6+ mos PF IM (Fluarix Quad PF) (Completed)

## 2017-07-05 NOTE — Assessment & Plan Note (Signed)
Reviewed health habits including diet and exercise and skin cancer prevention Reviewed appropriate screening tests for age  Also reviewed health mt list, fam hx and immunization status , as well as social and family history   See HPI Ref made for GI/ audiology/counseling and mammogram  Will hold off on dexa until these are done  amw rev  Labs rev  Flu shot today

## 2017-07-05 NOTE — Assessment & Plan Note (Signed)
Ongoing  Neg c diff  Neg stool cx  On chronic abx  Asked to try probiotics  ? Bact overgrowth or ibs Ref to GI

## 2017-07-05 NOTE — Assessment & Plan Note (Signed)
Scheduled annual screening mammogram Nl breast exam today  Encouraged monthly self exams   

## 2017-07-05 NOTE — Assessment & Plan Note (Signed)
Ref for symptomatic hearing loss

## 2017-07-05 NOTE — Assessment & Plan Note (Signed)
Disc goals for lipids and reasons to control them Rev labs with pt Rev low sat fat diet in detail  Good profile with diet control today

## 2017-07-05 NOTE — Assessment & Plan Note (Signed)
No change in breathing/ endurance

## 2017-07-05 NOTE — Assessment & Plan Note (Signed)
Continues keflex  Ongoing

## 2017-07-05 NOTE — Assessment & Plan Note (Signed)
With tearfulness and dep/anx symptoms  Reviewed stressors/ coping techniques/symptoms/ support sources/ tx options and side effects in detail today  This may greatly affect memory as well  Plan to try prozac 10 mg  Discussed expectations of SSRI medication including time to effectiveness and mechanism of action, also poss of side effects (early and late)- including mental fuzziness, weight or appetite change, nausea and poss of worse dep or anxiety (even suicidal thoughts)  Pt voiced understanding and will stop med and update if this occurs   Also ref for counseling  F/u planned  Disc role of thyroid- (elevated tsh)- consider tx once diarrhea is better

## 2017-07-05 NOTE — Assessment & Plan Note (Addendum)
New B12 shot  Then oral supl with 1000 mcg daily  Hope this will help energy and memory issues

## 2017-07-05 NOTE — Assessment & Plan Note (Signed)
This may be pseudodementia from her depression and thyroid issue  Not completely sure  Did fine on mini cog Start counseling and prozac  Consider levothyroxine when diarrhea is better  Re eval at f/u (follow closely)

## 2017-07-05 NOTE — Assessment & Plan Note (Signed)
Mild elevation of TSH with nl FT4 and some mild memory and depressive sympotms  May benefit from low dose levothyroxine - but not until diarrhea is resolved  Will continue to follow F/u made

## 2017-07-09 ENCOUNTER — Other Ambulatory Visit: Payer: Self-pay | Admitting: Family Medicine

## 2017-07-14 ENCOUNTER — Ambulatory Visit (INDEPENDENT_AMBULATORY_CARE_PROVIDER_SITE_OTHER): Payer: Medicare Other | Admitting: Gastroenterology

## 2017-07-14 ENCOUNTER — Encounter: Payer: Self-pay | Admitting: Gastroenterology

## 2017-07-14 ENCOUNTER — Other Ambulatory Visit: Payer: Self-pay

## 2017-07-14 VITALS — BP 127/75 | HR 74 | Temp 98.0°F | Ht 65.0 in | Wt 215.4 lb

## 2017-07-14 DIAGNOSIS — K529 Noninfective gastroenteritis and colitis, unspecified: Secondary | ICD-10-CM | POA: Diagnosis not present

## 2017-07-14 MED ORDER — RIFAXIMIN 550 MG PO TABS: 550.0000 mg | ORAL_TABLET | Freq: Three times a day (TID) | ORAL | 0 refills | Status: DC

## 2017-07-14 NOTE — Progress Notes (Signed)
Cephas Darby, MD [address]  [place]  [place], [place] [address]  Main: [phone]  Fax: [phone]    Gastroenterology Consultation  Referring Provider:     Abner Greenspan, MD Primary Care Physician:  Tower, Wynelle Fanny, MD Primary Gastroenterologist:  Dr. Cephas Darby Reason for Consultation:     Chronic diarrhea        HPI:   [name] is a 83 y.o. y/o female referred by Dr. [name], Wynelle Fanny, MD  for consultation & management of Chronic diarrhea. She is accompanied by her daughter today. 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 Since January 2018, she has been experiencing nonbloody loose bowel movements, occurring about once or twice a week. Each time, she would have 2-3 bowel movements, and she had episodes of incontinence per her daughter. In between these episodes, she does not have any BM. She tried Citrucel but not helping. She will take Imodium after one bowel movement when she has diarrhea. Prior to January, she reports that she was going daily or every other day. She denies any rectal bleeding, abdominal pain, abdominal distention, weight loss. She also takes artificial sweeteners. She had hernia repair and cholecystectomy.  Her C. difficile was negative on 07/01/2017.  GI Procedures: Colonoscopy 10/24/2014, two 7 mm polyps, pathology sessile serrated adenoma, diverticulosis  Past Medical History:  Diagnosis Date  . Anemia   . Arthritis    knee  . Diverticulosis   . Hx of colonic polyps 10/24/2014  . Hyperlipidemia   . Hypertension   . Osteopenia   . Overactive bladder    urge incontinence  . PSVT (paroxysmal supraventricular tachycardia) ([place])   . Pulmonary emboli ([place])   . Pulmonary hypertension ([place])   . Right ventricular failure ([place])   . Sleep apnea    CPAP  . Spinal stenosis     Past Surgical History:  Procedure Laterality Date  . BLADDER SUSPENSION  1993  .  CARDIAC CATHETERIZATION N/A 02/28/2015   Procedure: Right/Left Heart Cath and Coronary Angiography;  Surgeon: Troy Sine, MD;  Location: [facility]AS[facility]UP[facility];  Service: Cardiovascular;  Laterality: N/A;  . CATARACT EXTRACTION Bilateral 2008  . CHOLECYSTECTOMY    . COLONOSCOPY    . COLONOSCOPY N/A 10/24/2014   Procedure: COLONOSCOPY;  Surgeon: Gatha Mayer, MD;  Location: [facility] ENDOSCOPY;  Service: Endoscopy;  Laterality: N/A;  . CYST REMOVAL LEG Left 02/15/2016   cyst removal from back of left knee  . ELECTROPHYSIOLOGIC STUDY N/A 03/02/2015   Procedure: SVT Ablation;  Surgeon: Evans Lance, MD;  Location: [facility] CV LAB;  Service: Cardiovascular;  Laterality: N/A;  . INCISIONAL HERNIA REPAIR    . RIGHT HEART CATHETERIZATION N/A 07/04/2014   Procedure: RIGHT HEART CATH;  Surgeon: Jolaine Artist, MD;  Location: [facility] CATH LAB;  Service: Cardiovascular;  Laterality: N/A;  . TONSILLECTOMY AND ADENOIDECTOMY    . TOTAL KNEE ARTHROPLASTY Left 2000  . TOTAL KNEE ARTHROPLASTY Left 10/29/2012   at [facility]  . TOTAL KNEE ARTHROPLASTY Right 03/09/2014  . TOTAL KNEE REVISION  01/12/2012   Procedure: TOTAL KNEE REVISION;  Surgeon: Kerin Salen, MD;  Location: [facility];  Service: Orthopedics;  Laterality: Left;  DEPUY/ LCS , HAND SET  . TRIGGER FINGER RELEASE  01/12/2012   Procedure: RELEASE TRIGGER FINGER/A-1 PULLEY;  Surgeon: Kerin Salen, MD;  Location: [facility];  Service: Orthopedics;  Laterality: Right;    Prior to Admission medications   Medication Sig Start Date End Date Taking? Authorizing Provider  benzonatate (TESSALON) 200 MG capsule TAKE ONE CAPSULE BY MOUTH TWICE DAILY ASNEEDED FOR COUGH Patient taking differently: Take 200 mg by mouth daily. TAKE ONE CAPSULE BY MOUTH TWICE DAILY ASNEEDED FOR COUGH 12/31/16   Parrett, Fonnie Mu, NP  Calcium Carb-Cholecalciferol (CALCIUM 600/VITAMIN D3) 600-800 MG-UNIT TABS Take 1 tablet by mouth 2 (two) times daily.    [provider]  cephALEXin (KEFLEX)  500 MG capsule  06/12/17   [provider]  Cholecalciferol (VITAMIN D-1000 MAX ST) 1000 UNITS tablet Take 1,000 Units by mouth daily. 09/08/15   [provider]  FLUoxetine (PROZAC) 10 MG capsule Take 1 capsule (10 mg total) by mouth daily. 07/03/17   Tower, Wynelle Fanny, MD  hydrochlorothiazide (HYDRODIURIL) 25 MG tablet TAKE 1 TABLET DAILY 07/09/17   Tower, Wynelle Fanny, MD  metoprolol tartrate (LOPRESSOR) 25 MG tablet TAKE ONE-HALF (1/2) OF A TABLET BY MOUTHTWICE DAILY *PT DUE FOR APPT,MUST CALL TO SCHEDULE [phone]!* 04/08/17   Allred, Jeneen Rinks, MD  naproxen sodium (ANAPROX) 220 MG tablet Take 220 mg by mouth daily.     [provider]  OXYGEN Inhale 2 L into the lungs at bedtime.    [provider]  Polyethyl Glycol-Propyl Glycol (SYSTANE) 0.4-0.3 % SOLN Apply 1 drop to eye 2 (two) times daily.    [provider]  polyethylene glycol (MIRALAX / GLYCOLAX) packet Take 17 g by mouth daily as needed (CONSTIPATOIN).     [provider]  rifampin (RIFADIN) 300 MG capsule  05/26/17   [provider]    Family History  Problem Relation Age of Onset  . Breast cancer Mother   . Heart disease Father   . Diabetes Father   . Hypertension Father   . Uterine cancer Sister      Social History  Substance Use Topics  . Smoking status: Never Smoker  . Smokeless tobacco: Never Used  . Alcohol use No    Allergies as of 07/14/2017 - Review Complete 07/14/2017  Allergen Reaction Noted  . Prednisone Other (See Comments) 02/23/2014  . Zithromax [azithromycin] Other (See Comments) 02/19/2015  . Penicillins Hives and Swelling 02/23/2007  . Sulfonamide derivatives Hives 02/23/2007  . Adhesive [tape] Other (See Comments) and Rash 09/29/2013    Review of Systems:    All systems reviewed and negative except where noted in HPI.   Physical Exam:  BP 127/75   Pulse 74   Temp 98 F (36.7 C) (Oral)   Ht 5\' 5"  (1.651 m)   Wt 215 lb 6.4 oz (97.7 kg)    BMI 35.84 kg/m  No LMP recorded. Patient is postmenopausal.  General:   Alert,  Well-developed, well-nourished, pleasant and cooperative in NAD Head:  Normocephalic and atraumatic. Eyes:  Sclera clear, no icterus.   Conjunctiva pink. Ears:  Normal auditory acuity. Nose:  No deformity, discharge, or lesions. Mouth:  No deformity or lesions,oropharynx pink & moist. Neck:  Supple; no masses or thyromegaly. Lungs:  Respirations even and unlabored.  Clear throughout to auscultation.   No wheezes, crackles, or rhonchi. No acute distress. Heart:  Regular rate and rhythm; no murmurs, clicks, rubs, or gallops. Abdomen:  Normal bowel sounds.  No bruits.  Soft, non-tender and non-distended without masses, hepatosplenomegaly or hernias noted.  No guarding or rebound tenderness.   Rectal: Nor performed Msk:  Symmetrical without gross deformities Pulses:  Normal pulses  noted. Extremities:  No clubbing or edema.  No cyanosis. Neurologic:  Alert and oriented x3;  grossly normal neurologically. Psych:  Alert and cooperative. Normal mood and affect.  Imaging Studies: X-ray KUB revealed mild to moderate amount of stool  Assessment and Plan:   [name] is a 83 y.o. y/o female with chronic antibiotics to prevent prosthetic wound infection of the knee joint, with 9 months history of chronic, Intermittent watery diarrhea associated with fecal incontinence. Stool studies negative for C. difficile infection. She probably has bacterial overgrowth secondary to chronic antibiotic use. I recommend 2 weeks course of rifaximin 550 mg 3 times a day and fiber such as Benefiber or Citrucel daily. Also, avoid artificial sweeteners. Less likely microscopic colitis. She has no other constitutional symptoms to warrant colonoscopy.  Follow up in 4 weeks   Cephas Darby, MD

## 2017-07-15 ENCOUNTER — Encounter: Payer: Medicare Other | Attending: Internal Medicine | Admitting: Internal Medicine

## 2017-07-15 DIAGNOSIS — M349 Systemic sclerosis, unspecified: Secondary | ICD-10-CM | POA: Insufficient documentation

## 2017-07-15 DIAGNOSIS — L02416 Cutaneous abscess of left lower limb: Secondary | ICD-10-CM | POA: Diagnosis not present

## 2017-07-15 DIAGNOSIS — B9561 Methicillin susceptible Staphylococcus aureus infection as the cause of diseases classified elsewhere: Secondary | ICD-10-CM | POA: Insufficient documentation

## 2017-07-15 DIAGNOSIS — Z882 Allergy status to sulfonamides status: Secondary | ICD-10-CM | POA: Insufficient documentation

## 2017-07-15 DIAGNOSIS — M71062 Abscess of bursa, left knee: Secondary | ICD-10-CM | POA: Insufficient documentation

## 2017-07-15 DIAGNOSIS — I10 Essential (primary) hypertension: Secondary | ICD-10-CM | POA: Insufficient documentation

## 2017-07-15 DIAGNOSIS — G4730 Sleep apnea, unspecified: Secondary | ICD-10-CM | POA: Diagnosis not present

## 2017-07-15 DIAGNOSIS — Z9981 Dependence on supplemental oxygen: Secondary | ICD-10-CM | POA: Insufficient documentation

## 2017-07-15 DIAGNOSIS — Z96652 Presence of left artificial knee joint: Secondary | ICD-10-CM | POA: Insufficient documentation

## 2017-07-15 DIAGNOSIS — L97928 Non-pressure chronic ulcer of unspecified part of left lower leg with other specified severity: Secondary | ICD-10-CM | POA: Insufficient documentation

## 2017-07-17 NOTE — Progress Notes (Signed)
[name], [name] ([id number]) Visit Report for 07/15/2017 Abuse/Suicide Risk Screen Details [name], [name]. Date of Service: 07/15/2017 10:30 AM Medical Record Patient Account Number: [id number] Number: Treating RN: Ahmed Prima [birth year] (83 y.o. Other Clinician: Date of Birth/Sex: Female) Treating ROBSON, MICHAEL Primary Care Libia Fazzini: Loura Pardon Ronella Plunk/Extender: G Referring Lulie Hurd: Loura Pardon Weeks in Treatment: 0 Abuse/Suicide Risk Screen Items Answer ABUSE/SUICIDE RISK SCREEN: Has anyone close to you tried to hurt or harm you recentlyo No Do you feel uncomfortable with anyone in your familyo No Has anyone forced you do things that you didnot want to doo No Do you have any thoughts of harming yourselfo No Patient displays signs or symptoms of abuse and/or neglect. No Electronic Signature(s) Signed: 07/15/2017 5:51:06 PM By: Alric Quan Entered By: Alric Quan on 07/15/2017 10:33:00 [name] ([id number]) -------------------------------------------------------------------------------- Activities of Daily Living Details [name]. Date of Service: 07/15/2017 10:30 AM Medical Record Patient Account Number: [id number] Number: Treating RN: Ahmed Prima 02-29-1934 (83 y.o. Other Clinician: Date of Birth/Sex: Female) Treating ROBSON, MICHAEL Primary Care Anais Denslow: Loura Pardon Threasa Kinch/Extender: G Referring Lavern Maslow: Loura Pardon Weeks in Treatment: 0 Activities of Daily Living Items Answer Activities of Daily Living (Please select one for each item) Drive Automobile Completely Able Take Medications Completely Able Use Telephone Completely Able Care for Appearance Completely Able Use Toilet Completely Able Bath / Shower Completely Able Dress Self Completely Able Feed Self Completely Able Walk Need Assistance Get In / Out Bed Completely [place] for Self Need Assistance Electronic Signature(s) Signed: 07/15/2017 5:51:06 PM By: Alric Quan Entered By: Alric Quan on 07/15/2017 10:34:01 [name] ([id number]) -------------------------------------------------------------------------------- Education Assessment Details [name]. Date of Service: 07/15/2017 10:30 AM Medical Record Patient Account Number: [id number] Number: Treating RN: Ahmed Prima November 21, 1933 (83 y.o. Other Clinician: Date of Birth/Sex: Female) Treating ROBSON, MICHAEL Primary Care Earlean Fidalgo: Loura Pardon Kenly Henckel/Extender: G Referring Mayjor Ager: Ayesha Mohair in Treatment: 0 Primary Learner Assessed: Patient Learning Preferences/Education Level/Primary Language Learning Preference: Explanation, Printed Material Highest Education Level: High School Preferred Language: English Cognitive Barrier Assessment/Beliefs Language Barrier: No Translator Needed: No Memory Deficit: No Emotional Barrier: No Cultural/Religious Beliefs Affecting Medical No Care: Physical Barrier Assessment Impaired Vision: Yes Glasses Impaired Hearing: No Decreased Hand dexterity: No Knowledge/Comprehension Assessment Knowledge Level: Medium Comprehension Level: Medium Ability to understand written Medium instructions: Ability to understand verbal Medium instructions: Motivation Assessment Anxiety Level: Calm Cooperation: Cooperative Education Importance: Acknowledges Need Interest in Health Problems: Asks Questions Perception: Coherent Willingness to Engage in Self- Medium Management Activities: Readiness to Engage in Self- Medium Management Activities: FLYNN, LININGER ([id number]) Electronic Signature(s) Signed: 07/15/2017 5:51:06 PM By: Alric Quan Entered By: Alric Quan on 07/15/2017 10:34:22 [name]  ([id number]) -------------------------------------------------------------------------------- Fall Risk Assessment Details [name]. Date of Service: 07/15/2017 10:30 AM Medical Record Patient Account Number: [id number] Number: Treating RN: Ahmed Prima 10-13-1934 (83 y.o. Other Clinician: Date of Birth/Sex: Female) Treating ROBSON, MICHAEL Primary Care Annamae Shivley: Loura Pardon Ahman Dugdale/Extender: G Referring Lazar Tierce: Loura Pardon Weeks in Treatment: 0 Fall Risk Assessment Items Have you had 2 or more falls in the last 12 monthso 0 No Have you had any fall that resulted in injury in the last 12 monthso 0 No FALL RISK ASSESSMENT: History of falling - immediate or within 3 months 0 No Secondary diagnosis 0 No Ambulatory aid None/bed rest/wheelchair/nurse 0  No Crutches/cane/walker 15 Yes Furniture 0 No IV Access/Saline Lock 0 No Gait/Training Normal/bed rest/immobile 0 No Weak 0 No Impaired 0 No Mental Status Oriented to own ability 0 Yes Electronic Signature(s) Signed: 07/15/2017 5:51:06 PM By: Alric Quan Entered By: Alric Quan on 07/15/2017 10:34:34 [name] ([id number]) -------------------------------------------------------------------------------- Foot Assessment Details [name]. Date of Service: 07/15/2017 10:30 AM Medical Record Patient Account Number: [id number] Number: Treating RN: Ahmed Prima Jul 31, 1934 (83 y.o. Other Clinician: Date of Birth/Sex: Female) Treating ROBSON, MICHAEL Primary Care Paymon Rosensteel: Loura Pardon Zamarion Longest/Extender: G Referring Riddick Nuon: Loura Pardon Weeks in Treatment: 0 Foot Assessment Items Site Locations + = Sensation present, - = Sensation absent, C = Callus, U = Ulcer R = Redness, W = Warmth, M = Maceration, PU = Pre-ulcerative lesion F = Fissure, S = Swelling, D = Dryness Assessment Right: Left: Other Deformity: No No Prior Foot Ulcer: No No Prior  Amputation: No No Charcot Joint: No No Ambulatory Status: Gait: Electronic Signature(s) Signed: 07/15/2017 5:51:06 PM By: Alric Quan Entered By: Alric Quan on 07/15/2017 10:34:47 [name] ([id number]) -------------------------------------------------------------------------------- Nutrition Risk Assessment Details [name]. Date of Service: 07/15/2017 10:30 AM Medical Record Patient Account Number: [id number] Number: Treating RN: Ahmed Prima 1934-04-12 (83 y.o. Other Clinician: Date of Birth/Sex: Female) Treating ROBSON, MICHAEL Primary Care Berdie Malter: Loura Pardon Daniil Labarge/Extender: G Referring Bob Daversa: Loura Pardon Weeks in Treatment: 0 Height (in): 66 Weight (lbs): 218.3 Body Mass Index (BMI): 35.2 Nutrition Risk Assessment Items NUTRITION RISK SCREEN: I have an illness or condition that made me change the kind and/or 0 No amount of food I eat I eat fewer than two meals per day 0 No I eat few fruits and vegetables, or milk products 0 No I have three or more drinks of beer, liquor or wine almost every day 0 No I have tooth or mouth problems that make it hard for me to eat 0 No I don't always have enough money to buy the food I need 0 No I eat alone most of the time 0 No I take three or more different prescribed or over-the-counter drugs a 1 Yes day Without wanting to, I have lost or gained 10 pounds in the last six 0 No months I am not always physically able to shop, cook and/or feed myself 0 No Nutrition Protocols Good Risk Protocol Moderate Risk Protocol Electronic Signature(s) Signed: 07/15/2017 5:51:06 PM By: Alric Quan Entered By: Alric Quan on 07/15/2017 10:34:40

## 2017-07-17 NOTE — Progress Notes (Signed)
LILLYTH, SPONG ([id number]) Visit Report for 07/15/2017 Allergy List Details [name], [name]. Date of Service: 07/15/2017 10:30 AM Medical Record Patient Account Number: [id number] Number: Treating RN: Ahmed Prima May 08, 1934 (83 y.o. Other Clinician: Date of Birth/Sex: Female) Treating ROBSON, MICHAEL Primary Care Jordy Verba: Loura Pardon Ashdon Gillson/Extender: G Referring Mihcael Ledee: Loura Pardon Weeks in Treatment: 0 Allergies Active Allergies Zithromax Sulfa (Sulfonamide Antibiotics) prednisone adhesive tape Allergy Notes Electronic Signature(s) Signed: 07/15/2017 5:51:06 PM By: Alric Quan Entered By: Alric Quan on 07/15/2017 10:31:05 [name] ([id number]) -------------------------------------------------------------------------------- Arrival Information Details [name]. Date of Service: 07/15/2017 10:30 AM Medical Record Patient Account Number: [id number] Number: Treating RN: Ahmed Prima Mar 09, 1934 (83 y.o. Other Clinician: Date of Birth/Sex: Female) Treating ROBSON, MICHAEL Primary Care Jazmene Racz: Loura Pardon Camilah Spillman/Extender: G Referring Shewanda Sharpe: Ayesha Mohair in Treatment: 0 Visit Information Patient Arrived: Walker Arrival Time: 10:22 Accompanied By: daughter Transfer Assistance: EasyPivot Patient Lift Patient Identification Verified: Yes Secondary Verification Process Yes Completed: Patient Requires Transmission- No Based Precautions: Patient Has Alerts: No History Since Last Visit All ordered tests and consults were completed: No Added or deleted any medications: No Any new allergies or adverse reactions: No Had a fall or experienced change in activities of daily living that may affect risk of falls: No Signs or symptoms of abuse/neglect since last visito No Hospitalized since last visit: No Electronic Signature(s) Signed: 07/15/2017 5:51:06 PM By: Alric Quan Entered  By: Alric Quan on 07/15/2017 10:26:39 [name] ([id number]) -------------------------------------------------------------------------------- Clinic Level of Care Assessment Details [name]. Date of Service: 07/15/2017 10:30 AM Medical Record Patient Account Number: [id number] Number: Treating RN: Ahmed Prima 1934-01-20 (83 y.o. Other Clinician: Date of Birth/Sex: Female) Treating ROBSON, MICHAEL Primary Care Dimitry Holsworth: Loura Pardon Primus Gritton/Extender: G Referring Teola Felipe: Loura Pardon Weeks in Treatment: 0 Clinic Level of Care Assessment Items TOOL 1 Quantity Score X - Use when EandM and Procedure is performed on INITIAL visit 1 0 ASSESSMENTS - Nursing Assessment / Reassessment X - General Physical Exam (combine w/ comprehensive assessment (listed just 1 20 below) when performed on new pt. evals) X - Comprehensive Assessment (HX, ROS, Risk Assessments, Wounds Hx, etc.) 1 25 ASSESSMENTS - Wound and Skin Assessment / Reassessment []  - Dermatologic / Skin Assessment (not related to wound area) 0 ASSESSMENTS - Ostomy and/or Continence Assessment and Care []  - Incontinence Assessment and Management 0 []  - Ostomy Care Assessment and Management (repouching, etc.) 0 PROCESS - Coordination of Care X - Simple Patient / Family Education for ongoing care 1 15 []  - Complex (extensive) Patient / Family Education for ongoing care 0 []  - Staff obtains Programmer, systems, Records, Test Results / Process Orders 0 []  - Staff telephones HHA, Nursing Homes / Clarify orders / etc 0 []  - Routine Transfer to another Facility (non-emergent condition) 0 []  - Routine Hospital Admission (non-emergent condition) 0 X - New Admissions / Biomedical engineer / Ordering NPWT, Apligraf, etc. 1 15 []  - Emergency Hospital Admission (emergent condition) 0 PROCESS - Special Needs []  - Pediatric / Minor Patient Management 0 BECCI, BATTY ([id number]) []  - Isolation Patient  Management 0 []  - Hearing / Language / Visual special needs 0 []  - Assessment of Community assistance (transportation, D/C planning, etc.) 0 []  - Additional assistance / Altered mentation 0 []  - Support Surface(s) Assessment (bed, cushion, seat, etc.) 0 INTERVENTIONS - Miscellaneous []  - External ear exam 0 []  - Patient Transfer (multiple staff / Civil Service fast streamer /  Similar devices) 0 []  - Simple Staple / Suture removal (25 or less) 0 []  - Complex Staple / Suture removal (26 or more) 0 []  - Hypo/Hyperglycemic Management (do not check if billed separately) 0 []  - Ankle / Brachial Index (ABI) - do not check if billed separately 0 Has the patient been seen at the hospital within the last three years: Yes Total Score: 75 Level Of Care: New/Established - Level 2 Electronic Signature(s) Unsigned Previous Signature: 07/15/2017 5:51:06 PM Version By: Alric Quan Entered By: Alric Quan on 07/16/2017 07:57:28 Signature(s): [name] ([id number]) Date(s): -------------------------------------------------------------------------------- Encounter Discharge Information Details [name]. Date of Service: 07/15/2017 10:30 AM Medical Record Patient Account Number: [id number] Number: Treating RN: Ahmed Prima 07-17-1934 (83 y.o. Other Clinician: Date of Birth/Sex: Female) Treating ROBSON, MICHAEL Primary Care Alizza Sacra: Loura Pardon Paetyn Pietrzak/Extender: G Referring Sailor Haughn: Loura Pardon Weeks in Treatment: 0 Encounter Discharge Information Items Discharge Pain Level: 0 Discharge Condition: Stable Ambulatory Status: Walker Discharge Destination: Home Transportation: Private Auto Accompanied By: daughter Schedule Follow-up Appointment: Yes Medication Reconciliation completed No and provided to Patient/Care Almond Fitzgibbon: Provided on Clinical Summary of Care: 07/15/2017 Form Type Recipient Paper Patient FW Electronic Signature(s) Signed: 07/16/2017 9:09:16  AM By: Ruthine Dose Entered By: Ruthine Dose on 07/15/2017 11:14:18 [name] ([id number]) -------------------------------------------------------------------------------- Lower Extremity Assessment Details [name]. Date of Service: 07/15/2017 10:30 AM Medical Record Patient Account Number: [id number] Number: Treating RN: Ahmed Prima Mar 13, 1934 (83 y.o. Other Clinician: Date of Birth/Sex: Female) Treating ROBSON, MICHAEL Primary Care Demetress Tift: Loura Pardon Kiante Petrovich/Extender: G Referring Lamesha Tibbits: Loura Pardon Weeks in Treatment: 0 Electronic Signature(s) Signed: 07/15/2017 5:51:06 PM By: Alric Quan Entered By: Alric Quan on 07/15/2017 10:35:01 [name] ([id number]) -------------------------------------------------------------------------------- Multi Wound Chart Details [name]. Date of Service: 07/15/2017 10:30 AM Medical Record Patient Account Number: [id number] Number: Treating RN: Ahmed Prima [birth year] (83 y.o. Other Clinician: Date of Birth/Sex: Female) Treating ROBSON, MICHAEL Primary Care Shiryl Ruddy: Loura Pardon Pattijo Juste/Extender: G Referring Irmgard Rampersaud: Loura Pardon Weeks in Treatment: 0 Vital Signs Height(in): 66 Pulse(bpm): 59 Weight(lbs): 218.3 Blood Pressure 122/50 (mmHg): Body Mass Index(BMI): 35 Temperature(F): 97.8 Respiratory Rate 16 (breaths/min): Photos: [4:No Photos] [5:No Photos] [N/A:N/A] Wound Location: [4:Left Knee - Anterior] [5:Left Knee - Posterior] [N/A:N/A] Wounding Event: [4:Gradually Appeared] [5:Gradually Appeared] [N/A:N/A] Primary Etiology: [4:To be determined] [5:To be determined] [N/A:N/A] Comorbid History: [4:Cataracts, Sleep Apnea, Cataracts, Sleep Apnea, N/A Arrhythmia, Hypertension, Arrhythmia, Hypertension, Scleroderma] [5:Scleroderma] Date Acquired: [4:07/07/2017] [5:07/07/2017] [N/A:N/A] Weeks of Treatment: [4:0] [5:0] [N/A:N/A] Wound  Status: [4:Open] [5:Open] [N/A:N/A] Measurements L x W x D 3.3x2.5x0.1 [5:0.3x0.1x2.1] [N/A:N/A] (cm) Area (cm) : [4:6.48] [5:0.024] [N/A:N/A] Volume (cm) : [4:0.648] [5:0.049] [N/A:N/A] % Reduction in Area: [4:0.00%] [5:N/A] [N/A:N/A] % Reduction in Volume: 0.00% [5:N/A] [N/A:N/A] Classification: [4:Partial Thickness] [5:Partial Thickness] [N/A:N/A] Exudate Amount: [4:Large] [5:Large] [N/A:N/A] Exudate Type: [4:Serosanguineous] [5:Serous] [N/A:N/A] Exudate Color: [4:red, brown] [5:amber] [N/A:N/A] Wound Margin: [4:Distinct, outline attached Distinct, outline attached N/A] Granulation Amount: [4:Large (67-100%)] [5:Large (67-100%)] [N/A:N/A] Granulation Quality: [4:Red] [5:Red] [N/A:N/A] Necrotic Amount: [4:Small (1-33%)] [5:None Present (0%)] [N/A:N/A] Necrotic Tissue: [4:Eschar, Adherent Slough N/A] [N/A:N/A] Epithelialization: [4:None] [5:None] [N/A:N/A] Debridement: [4:Debridement ([id number])] [5:N/A] [N/A:N/A] Pre-procedure 10:53 N/A N/A Verification/Time Out Taken: Pain Control: Lidocaine 4% Topical N/A N/A Solution Tissue Debrided: Fibrin/Slough, Exudates, N/A N/A Subcutaneous Level: Skin/Subcutaneous N/A N/A Tissue Debridement Area (sq 8.25 N/A N/A cm): Instrument: Curette N/A N/A Bleeding: Moderate N/A N/A Hemostasis Achieved: Silver Nitrate N/A N/A Procedural Pain: 0 N/A N/A Post  Procedural Pain: 0 N/A N/A Debridement Treatment Procedure was tolerated N/A N/A Response: well Post Debridement 3.3x2.5x0.1 N/A N/A Measurements L x W x D (cm) Post Debridement 0.648 N/A N/A Volume: (cm) Periwound Skin Texture: No Abnormalities Noted No Abnormalities Noted N/A Periwound Skin No Abnormalities Noted No Abnormalities Noted N/A Moisture: Periwound Skin Color: No Abnormalities Noted No Abnormalities Noted N/A Temperature: No Abnormality No Abnormality N/A Tenderness on Yes Yes N/A Palpation: Wound Preparation: Ulcer Cleansing: Ulcer Cleansing:  N/A Rinsed/Irrigated with Rinsed/Irrigated with Saline Saline Topical Anesthetic Topical Anesthetic Applied: Other: lidocaine Applied: Other: lidocaine 4% 4% Procedures Performed: Debridement N/A N/A Treatment Notes Wound #4 (Left, Anterior Knee) 1. Cleansed with: Clean wound with Normal Saline 2. Anesthetic Topical Lidocaine 4% cream to wound bed prior to debridement 4. Dressing Applied: Hydrogel Prisma Ag 5. Secondary Dressing Applied SPARROW, SIRACUSA. ([id number]) Bordered Foam Dressing Dry Gauze Notes coban Wound #5 (Left, Posterior Knee) 1. Cleansed with: Clean wound with Normal Saline 2. Anesthetic Topical Lidocaine 4% cream to wound bed prior to debridement 4. Dressing Applied: Iodoform packing Gauze 5. Secondary Dressing Applied Bordered Foam Dressing Dry Gauze Notes coban Electronic Signature(s) Signed: 07/15/2017 5:33:43 PM By: Linton Ham MD Entered By: Linton Ham on 07/15/2017 12:27:07 [name] ([id number]) -------------------------------------------------------------------------------- [place] Details Patient Name: TAMAIRA, CIRIELLO. Date of Service: 07/15/2017 10:30 AM Medical Record Patient Account Number: [id number] Number: Treating RN: Ahmed Prima 03-Feb-1934 (83 y.o. Other Clinician: Date of Birth/Sex: Female) Treating ROBSON, MICHAEL Primary Care Melvie Paglia: Loura Pardon Tila Millirons/Extender: G Referring Laurell Coalson: Loura Pardon Weeks in Treatment: 0 Active Inactive ` Abuse / Safety / Falls / Self Care Management Nursing Diagnoses: Potential for falls Goals: Patient will not experience any injury related to falls Date Initiated: 07/15/2017 Target Resolution Date: 10/31/2017 Goal Status: Active Interventions: Assess Activities of Daily Living upon admission and as needed Assess fall risk on admission and as needed Notes: ` Nutrition Nursing Diagnoses: Imbalanced nutrition Potential for alteratiion in  Nutrition/Potential for imbalanced nutrition Goals: Patient/caregiver agrees to and verbalizes understanding of need to use nutritional supplements and/or vitamins as prescribed Date Initiated: 07/15/2017 Target Resolution Date: 10/31/2017 Goal Status: Active Interventions: Assess patient nutrition upon admission and as needed per policy Notes: AVAMARIE, CROSSLEY ([id number]) Orientation to the Wound Care Program Nursing Diagnoses: Knowledge deficit related to the wound healing center program Goals: Patient/caregiver will verbalize understanding of the [facility] Date Initiated: 07/15/2017 Target Resolution Date: 08/01/2017 Goal Status: Active Interventions: Provide education on orientation to the wound center Notes: ` Pain, Acute or Chronic Nursing Diagnoses: Pain, acute or chronic: actual or potential Potential alteration in comfort, pain Goals: Patient/caregiver will verbalize adequate pain control between visits Date Initiated: 07/15/2017 Target Resolution Date: 10/31/2017 Goal Status: Active Interventions: Complete pain assessment as per visit requirements Notes: ` Wound/Skin Impairment Nursing Diagnoses: Impaired tissue integrity Knowledge deficit related to ulceration/compromised skin integrity Goals: Ulcer/skin breakdown will have a volume reduction of 80% by week 12 Date Initiated: 07/15/2017 Target Resolution Date: 10/24/2017 Goal Status: Active Interventions: Assess patient/caregiver ability to perform ulcer/skin care regimen upon admission and as needed Assess ulceration(s) every visit MAKAILEY, HODGKIN ([id number]) Notes: Electronic Signature(s) Signed: 07/15/2017 5:51:06 PM By: Alric Quan Entered By: Alric Quan on 07/15/2017 10:42:27 [name] ([id number]) -------------------------------------------------------------------------------- Pain Assessment Details [name]. Date of Service: 07/15/2017 10:30  AM Medical Record Patient Account Number: [id number] Number: Treating RN: Ahmed Prima 1934-01-08 (83 y.o. Other Clinician: Date of Birth/Sex:  Female) Treating ROBSON, MICHAEL Primary Care Javona Bergevin: Loura Pardon Violanda Bobeck/Extender: G Referring Iona Stay: Loura Pardon Weeks in Treatment: 0 Active Problems Location of Pain Severity and Description of Pain Patient Has Paino No Site Locations Pain Management and Medication Current Pain Management: Electronic Signature(s) Signed: 07/15/2017 5:51:06 PM By: Alric Quan Entered By: Alric Quan on 07/15/2017 10:26:45 [name] ([id number]) -------------------------------------------------------------------------------- Patient/Caregiver Education Details [name]. Date of Service: 07/15/2017 10:30 AM Medical Record Patient Account Number: [id number] Number: Treating RN: Ahmed Prima 07/26/1934 (83 y.o. Other Clinician: Date of Birth/Gender: Female) Treating ROBSON, MICHAEL Primary Care Physician: Loura Pardon Physician/Extender: G Referring Physician: Ayesha Mohair in Treatment: 0 Education Assessment Education Provided To: Patient and Caregiver daughter Education Topics Provided Welcome To [facility]: Handouts: [facility] Methods: Explain/Verbal Responses: State content correctly Wound/Skin Impairment: Handouts: Other: change dressing as ordered Methods: Demonstration, Explain/Verbal Responses: State content correctly Electronic Signature(s) Signed: 07/15/2017 5:51:06 PM By: Alric Quan Entered By: Alric Quan on 07/15/2017 10:43:25 Yuhasz, Blima Singer ([id number]) -------------------------------------------------------------------------------- Wound Assessment Details [name]. Date of Service: 07/15/2017 10:30 AM Medical Record Patient Account Number: [id number] Number: Treating RN:  Ahmed Prima [birth year] (83 y.o. Other Clinician: Date of Birth/Sex: Female) Treating ROBSON, [place] Primary Care Ayvah Caroll: Loura Pardon Eren Puebla/Extender: G Referring Macaria Bias: Loura Pardon Weeks in Treatment: 0 Wound Status Wound Number: 4 Primary To be determined Etiology: Wound Location: Left Knee - Anterior Wound Open Wounding Event: Gradually Appeared Status: Date Acquired: 07/07/2017 Comorbid Cataracts, Sleep Apnea, Arrhythmia, Weeks Of Treatment: 0 History: Hypertension, Scleroderma Clustered Wound: No Photos Photo Uploaded By: Alric Quan on 07/15/2017 17:49:37 Wound Measurements Length: (cm) 3.3 Width: (cm) 2.5 Depth: (cm) 0.1 Area: (cm) 6.48 Volume: (cm) 0.648 % Reduction in Area: 0% % Reduction in Volume: 0% Epithelialization: None Tunneling: No Undermining: No Wound Description Classification: Partial Thickness Foul Odor Aft Wound Margin: Distinct, outline attached Slough/Fibrin Exudate Amount: Large Exudate Type: Serosanguineous Exudate Color: red, brown er Cleansing: No o No Wound Bed Granulation Amount: Large (67-100%) Granulation Quality: Red Necrotic Amount: Small (1-33%) Barkett, Blima Singer ([id number]) Necrotic Quality: Eschar, Adherent Slough Periwound Skin Texture Texture Color No Abnormalities Noted: No No Abnormalities Noted: No Moisture Temperature / Pain No Abnormalities Noted: No Temperature: No Abnormality Tenderness on Palpation: Yes Wound Preparation Ulcer Cleansing: Rinsed/Irrigated with Saline Topical Anesthetic Applied: Other: lidocaine 4%, Treatment Notes Wound #4 (Left, Anterior Knee) 1. Cleansed with: Clean wound with Normal Saline 2. Anesthetic Topical Lidocaine 4% cream to wound bed prior to debridement 4. Dressing Applied: Hydrogel Prisma Ag 5. Secondary Dressing Applied Bordered Foam Dressing Dry Gauze Notes coban Electronic Signature(s) Signed: 07/15/2017 5:51:06 PM By: Alric Quan Entered  By: Alric Quan on 07/15/2017 10:41:16 [name] ([id number]) -------------------------------------------------------------------------------- Wound Assessment Details [name]. Date of Service: 07/15/2017 10:30 AM Medical Record Patient Account Number: [id number] Number: Treating RN: Ahmed Prima [birth year] (83 y.o. Other Clinician: Date of Birth/Sex: Female) Treating ROBSON, MICHAEL Primary Care Devanta Daniel: Loura Pardon Gwynn Crossley/Extender: G Referring Arvie Villarruel: Loura Pardon Weeks in Treatment: 0 Wound Status Wound Number: 5 Primary To be determined Etiology: Wound Location: Left Knee - Posterior Wound Open Wounding Event: Gradually Appeared Status: Date Acquired: 07/07/2017 Comorbid Cataracts, Sleep Apnea, Arrhythmia, Weeks Of Treatment: 0 History: Hypertension, Scleroderma Clustered Wound: No Photos Photo Uploaded By: Alric Quan on 07/15/2017 17:49:57 Wound Measurements Length: (cm) 0.3 Width: (cm) 0.1 Depth: (cm) 2.1 Area: (cm) 0.024 Volume: (cm) 0.049 % Reduction  in Area: % Reduction in Volume: Epithelialization: None Tunneling: No Undermining: No Wound Description Classification: Partial Thickness Foul Odor Aft Wound Margin: Distinct, outline attached Slough/Fibrin Exudate Amount: Large Exudate Type: Serous Exudate Color: amber er Cleansing: No o Yes Wound Bed Granulation Amount: Large (67-100%) Granulation Quality: Red Necrotic Amount: None Present (0%) Dahan, Blima Singer ([id number]) Periwound Skin Texture Texture Color No Abnormalities Noted: No No Abnormalities Noted: No Moisture Temperature / Pain No Abnormalities Noted: No Temperature: No Abnormality Tenderness on Palpation: Yes Wound Preparation Ulcer Cleansing: Rinsed/Irrigated with Saline Topical Anesthetic Applied: Other: lidocaine 4%, Treatment Notes Wound #5 (Left, Posterior Knee) 1. Cleansed with: Clean wound with Normal Saline 2.  Anesthetic Topical Lidocaine 4% cream to wound bed prior to debridement 4. Dressing Applied: Iodoform packing Gauze 5. Secondary Dressing Applied Bordered Foam Dressing Dry Gauze Notes coban Electronic Signature(s) Signed: 07/15/2017 5:51:06 PM By: Alric Quan Entered By: Alric Quan on 07/15/2017 10:40:59 [name] ([id number]) -------------------------------------------------------------------------------- Vitals Details [name]. Date of Service: 07/15/2017 10:30 AM Medical Record Patient Account Number: [id number] Number: Treating RN: Ahmed Prima 03-01-1934 (83 y.o. Other Clinician: Date of Birth/Sex: Female) Treating ROBSON, MICHAEL Primary Care Martez Weiand: Loura Pardon Colbin Jovel/Extender: G Referring Rozann Holts: Loura Pardon Weeks in Treatment: 0 Vital Signs Time Taken: 10:26 Temperature (F): 97.8 Height (in): 66 Pulse (bpm): 59 Source: Stated Respiratory Rate (breaths/min): 16 Weight (lbs): 218.3 Blood Pressure (mmHg): 122/50 Source: Measured Reference Range: 80 - 120 mg / dl Body Mass Index (BMI): 35.2 Electronic Signature(s) Signed: 07/15/2017 5:51:06 PM By: Alric Quan Entered By: Alric Quan on 07/15/2017 10:30:50

## 2017-07-17 NOTE — Progress Notes (Signed)
AMANA, BOUSKA ([id number]) Visit Report for 07/15/2017 Chief Complaint Document Details [name], [name]. Date of Service: 07/15/2017 10:30 AM Medical Record Patient Account Number: [id number] Number: Treating RN: Ahmed Prima [birth year] (83 y.o. Other Clinician: Date of Birth/Sex: Female) Treating Walden Statz Primary Care Provider: Loura Pardon Provider/Extender: G Referring Provider: Loura Pardon Weeks in Treatment: 0 Information Obtained from: Patient Chief Complaint 10/28/16; patient is here for review of a draining area in her left popliteal fossa that is been present for 8 days 04/07/17; patient is here for review of a draining area in her left popliteal fossa that we have seen her for previously in this clinic. She also has an excoriated area on the left anterior patella. 07/15/17; patient is here for review of a draining area and her left popliteal fossa that opened up a week ago for which we have previously seen her in this clinic. She also has an excoriated area on the left anterior patella also not new Electronic Signature(s) Signed: 07/15/2017 5:33:43 PM By: Linton Ham MD Entered By: Linton Ham on 07/15/2017 12:28:03 [name] ([id number]) -------------------------------------------------------------------------------- Debridement Details [name]. Date of Service: 07/15/2017 10:30 AM Medical Record Patient Account Number: [id number] Number: Treating RN: Ahmed Prima [birth year] (83 y.o. Other Clinician: Date of Birth/Sex: Female) Treating  Grosser Primary Care Provider: Loura Pardon Provider/Extender: G Referring Provider: Loura Pardon Weeks in Treatment: 0 Debridement Performed for Wound #4 Left,Anterior Knee Assessment: Performed By: Physician Ricard Dillon, MD Debridement: Debridement Pre-procedure Verification/Time Out Yes - 10:53 Taken: Start Time: 10:54 Pain Control:  Lidocaine 4% Topical Solution Level: Skin/Subcutaneous Tissue Total Area Debrided (L x 3.3 (cm) x 2.5 (cm) = 8.25 (cm) W): Tissue and other Viable, Non-Viable, Exudate, Fibrin/Slough, Subcutaneous material debrided: Instrument: Curette Bleeding: Moderate Hemostasis Achieved: Silver Nitrate End Time: 10:58 Procedural Pain: 0 Post Procedural Pain: 0 Response to Treatment: Procedure was tolerated well Post Debridement Measurements of Total Wound Length: (cm) 3.3 Width: (cm) 2.5 Depth: (cm) 0.1 Volume: (cm) 0.648 Character of Wound/Ulcer Post Requires Further Debridement Debridement: Post Procedure Diagnosis Same as Pre-procedure Electronic Signature(s) Signed: 07/15/2017 5:33:43 PM By: Linton Ham MD Signed: 07/15/2017 5:51:06 PM By: Alric Quan Entered By: Linton Ham on 07/15/2017 12:27:30 [name] ([id number]) Rogers Blocker, Blima Singer ([id number]) -------------------------------------------------------------------------------- HPI Details [name]. Date of Service: 07/15/2017 10:30 AM Medical Record Patient Account Number: [id number] Number: Treating RN: Ahmed Prima 1934-06-18 (83 y.o. Other Clinician: Date of Birth/Sex: Female) Treating Yohan Samons Primary Care Provider: Loura Pardon Provider/Extender: G Referring Provider: Loura Pardon Weeks in Treatment: 0 History of Present Illness HPI Description: 10/28/16; this is an 83 year old woman who arrives with a complicated medical issue. 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 Initially in 2000 and apparently most recently in 2014. 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 Indeed she was hospitalized on 02/13/16 and discharged on 02/19/16. She was taken to the OR and it was found that the popliteal abscess was communicating with her joint space. Signed to feel fluid grew MSSA. Patient was treated with IandD of the  abscess. She completed IV cefazolin on 03/28/16 and since then has been on Keflex and rifampin for chronic suppression. Her infectious disease doctor is Dr.Shrestha at [facility]. The patient was seen in our [place] sister clinic through much of October and November 2017. At that point she had a draining sinus that eventually closed over. She noted  some pain behind her knee surrounding Christmas day and was seen in our emergency room on 10/23/16. During this ER visit she underwent tendon incision and drainage of a left Baker's cyst and clear colored synovial fluid was obtained. Doxycycline was added for a week to her Keflex and rifampin. A CT scan was ordered but I don't think was ever done due to artifacts from hardware. Her wound culture was negative. She has been using iodoform packing. She states her knee is painful when she stands on it for a period of time. She is not systemically unwell. She has a history of scleroderma or without is been under control for some period of time. 11/04/16 the culture that I did of this area last week showed coag-negative staph and a few enterococcus faecium. Enterococcus is ampicillin resistant. The coag negative staph is only sensitive to vancomycin and tetracycline. Previous joint infection was methicillin sensitive staph aureus. It seems I misunderstood what she said and that her infectious disease clinic appointment is this Friday and the orthopedic appointment was last Friday. I faxed the culture results to the infectious disease clinic but I've given him a copy of it today. Per the patient and her family they orthopedic surgeon did not want to do anything further to this joint. The patient is not systemically unwell in particular no fever or chills and the pain she has is really minimal 11/18/16; this is a patient who has an infected left knee prosthetic joint. She went to see infectious disease at [facility] who apparently did did not recommend any further  antibiotic therapy. She continues to feel the same episodic pain but no systemic symptoms of particular no fever. The depth of the wound in the popliteal fossa of her left knee is 3.5 versus 5 cm last week 11/25/16 4.5 cm of depth today. Draining exit site. We have treating this with iodoform packing however I really have no expectation of healing and to be truthful the amount of drainage that is coming out of this knee I don't think closure of this site is necessarily considered a positive outcome. Her left knee remains warm but not painful there is an effusion she has no systemic symptoms no fever no chills or and she feels well 12/31/16. Still is same amount of depth today using a skinny at 4.6 cm. There is less drainage. We have been using iodoform packing and I really have no expectation of healing and to be truthful closing this sinus probably is really not the best thing to consider. 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 HAILEA, EAGLIN. ([id number]) She is not felt to be a candidate for a fourth replacement. In the meantime she does not complain of pain except when the dressing is being changed episodically. She has not been systemically unwell no fever or chills or other issues. 01/14/17; after the patient left on 3/7 her daughter tells me the next day the wound had closed over and therefore this is not intact since. Over the last 3 days the patient has noted significant pain in the popliteal fossa of the involved left knee. 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 MSSA was the cause of the original identified joint infection treated at [facility] 01/21/17; I saw this patient last week at which time there was intense erythema in the popliteal fossa, tenderness and a ballotable fluid collection. I did an IandD on this. Surprisingly  the culture of this was negative although as a result of the  IandD her tenderness and erythema in the area is resolved. We continue with the iodoform packing READMISSION 04/07/17; this is a patient I know from 2 prior admissions to this clinic starting in January of this year. Her history is essentially as noted above. 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 She has a draining sinus into the popliteal fossa of the left knee. This remains open and drains. She has been using iodoform packing to this area with gauze over the top. To be truthful I haven't really been certain whether having this area close would be helpful or harmful to this patient. Previously when it's closed she develops a lot more pain in the back of the popliteal fossa and more pain in the knee. She is here today because the packing of the area has become increasingly painful in fact the patient describes this as a "torture chamber" experience. They are packing this wound twice a day. The patient follows with orthopedic surgery and infectious disease at [facility] although she hasn't seen them since she was last here in March. She denies any systemic symptoms including fever chills etc. She states except for the changing of the dressing her pain when she is mobilizing on the knee is 2/10. She also has a area on the left anterior patella in the incision that is excoriated open. The patient states this is itchy and she does a lot of scratching. They state this was here last time she was here but I have no recollection or documentation of this 04/21/17; patient was readmitted to clinic 2 weeks ago. She has a draining sinus in the left popliteal fossa and a chronically infected left total knee replacement. She also had an excoriation on the left anterior knee. She arrives in clinic today with all of this looking considerably better. The divot in the popliteal fossa is actually closed. READMISSION 07/15/17; the  patient arrived today with a real opening of the draining sinus in her left popliteal fossa. This opened about a week ago after developing a painful "knot". Is been draining clear fluid. The patient's pain is a lot better. She is not been systemically unwell. oShe also is been dealing with an area on the left anterior leg just below the tibial plateau. She also had this last time. Her daughter states that she scratches this area continuously. I have reviewed the last infectious disease note from August. 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 Much to my chagrin they seem reluctant to tap the joint. A culture of purulent material that came out of the popliteal fossa earlier this year cultured coag-negative staph aureus [not an uncommon cause of prosthetic joint infection]. However they really ignored this and they state that the source was questionable. If they believe this I am wondering why they're so reluctant to tap the fluid. She has not seen orthopedics Electronic Signature(s) SAWSAN, RIGGIO ([id number]) Signed: 07/15/2017 5:33:43 PM By: Linton Ham MD Entered By: Linton Ham on 07/15/2017 12:34:09 [name], [name] ([id number]) -------------------------------------------------------------------------------- Physical Exam Details [name], [name]. Date of Service: 07/15/2017 10:30 AM Medical Record Patient Account Number: [id number] Number: Treating RN: Ahmed Prima 25-Sep-1934 (83 y.o. Other Clinician: Date of Birth/Sex: Female) Treating Nyasia Baxley Primary Care Provider: Loura Pardon Provider/Extender: G Referring Provider: [name],  MARNE Weeks in Treatment: 0 Constitutional Sitting or standing Blood Pressure is within target range for patient.. Pulse regular and within target range for patient.Marland Kitchen Respirations regular, non-labored and within target range.. Temperature  is normal and within the target range for the patient.Marland Kitchen appears in no distress. Eyes Conjunctivae clear. No discharge. Respiratory Respiratory effort is easy and symmetric bilaterally. Rate is normal at rest and on room air.. Cardiovascular Heart rhythm and rate regular, without murmur or gallop.Marland Kitchen Lymphatic None palpable in the popliteal or inguinal area. Musculoskeletal As usual she has a swollen, warm left knee with clear evidence of an effusion.. Integumentary (Hair, Skin) There is no rash and no overt erythema on the left knee or extending above. It. Psychiatric No evidence of depression, anxiety, or agitation. Calm, cooperative, and communicative. Appropriate interactions and affect.. Notes Wound exam; small probing hole in the popliteal fossa. This has a scant amount of what looks like synovial fluid. This is compatible with the sinus tract that she develops. Iodoform to this area oThe area on the left anterior knee does not have an epithelialized surface what looks to be an excoriated subcutaneous surface. Using a #5 curet I debrided this hopeful to get a better-looking surface on the top of this. We will use silver collagen Electronic Signature(s) Signed: 07/15/2017 5:33:43 PM By: Linton Ham MD Entered By: Linton Ham on 07/15/2017 12:32:12 [name] ([id number]) -------------------------------------------------------------------------------- Physician Orders Details [name]. Date of Service: 07/15/2017 10:30 AM Medical Record Patient Account Number: [id number] Number: Treating RN: Ahmed Prima June 27, 1934 (83 y.o. Other Clinician: Date of Birth/Sex: Female) Treating Kaylee Wombles Primary Care Provider: Loura Pardon Provider/Extender: G Referring Provider: Ayesha Mohair in Treatment: 0 Verbal / Phone Orders: Yes Clinician: Pinkerton, Debi Read Back and Verified: Yes Diagnosis Coding Wound Cleansing Wound #4  Left,Anterior Knee o Clean wound with Normal Saline. o Cleanse wound with mild soap and water o May Shower, gently pat wound dry prior to applying new dressing. Wound #5 Left,Posterior Knee o Clean wound with Normal Saline. o Cleanse wound with mild soap and water o May Shower, gently pat wound dry prior to applying new dressing. Anesthetic Wound #4 Left,Anterior Knee o Topical Lidocaine 4% cream applied to wound bed prior to debridement Wound #5 Left,Posterior Knee o Topical Lidocaine 4% cream applied to wound bed prior to debridement Skin Barriers/Peri-Wound Care Wound #4 Left,Anterior Knee o Skin Prep Wound #5 Left,Posterior Knee o Skin Prep Primary Wound Dressing Wound #4 Left,Anterior Knee o Hydrogel o Prisma Ag - moisten with hydrogel Wound #5 Left,Posterior Knee o Iodoform packing Gauze Secondary Dressing [name], [name] ([id number]) Wound #4 Left,Anterior Knee o Dry Gauze o Boardered Foam Dressing o Other - coban to secure Wound #5 Left,Posterior Knee o Dry Gauze o Boardered Foam Dressing o Other - coban to secure Dressing Change Frequency Wound #4 Left,Anterior Knee o Change dressing every other day. Wound #5 Left,Posterior Knee o Change dressing every day. Follow-up Appointments Wound #4 Left,Anterior Knee o Return Appointment in 1 week. Wound #5 Left,Posterior Knee o Return Appointment in 1 week. Edema Control Wound #4 Left,Anterior Knee o Elevate legs to the level of the heart and pump ankles as often as possible Wound #5 Left,Posterior Knee o Elevate legs to the level of the heart and pump ankles as often as possible Additional Orders / Instructions Wound #4 Left,Anterior Knee o Increase protein intake. Wound #5 Left,Posterior Knee o Increase protein intake. Electronic Signature(s) Signed: 07/15/2017 5:33:43 PM By: Dellia Nims,  Legrand Como MD Signed: 07/15/2017 5:51:06 PM By: Alric Quan Entered By:  Alric Quan on 07/15/2017 11:13:50 [name] ([id number]) -------------------------------------------------------------------------------- Problem List Details [name], [name]. Date of Service: 07/15/2017 10:30 AM Medical Record Patient Account Number: [id number] Number: Treating RN: Ahmed Prima 07-Apr-1934 (83 y.o. 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 Date of Service: 07/15/2017 10:30 AM Medical Record Patient Account Number: [id number] Number: Treating RN: Ahmed Prima Mar 24, 1934 (83 y.o. Other Clinician: Date of Birth/Sex: Female) Treating Glennie Bose Primary Care Provider: Loura Pardon Provider/Extender: G Referring Provider: Loura Pardon Weeks in Treatment: 0 Subjective Chief Complaint Information obtained from Patient 10/28/16; patient is here for review of a draining area in her left popliteal fossa that is been present for 8 days 04/07/17; patient is here for review of a draining area in her left popliteal fossa that we  have seen her for previously in this clinic. She also has an excoriated area on the left anterior patella. 07/15/17; patient is here for review of a draining area and her left popliteal fossa that opened up a week ago for which we have previously seen her in this clinic. She also has an excoriated area on the left anterior patella also not new History of Present Illness (HPI) 10/28/16; this is an 83 year old woman who arrives with a complicated medical issue. 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 Initially in 2000 and apparently most recently in 2014. 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 Indeed she was hospitalized on 02/13/16 and discharged on 02/19/16. She was taken to the OR and it was found that the popliteal abscess was communicating with her joint space. Signed to feel fluid grew MSSA. Patient was treated with IandD of the abscess. She completed IV cefazolin on 03/28/16 and since then has been on Keflex and rifampin for chronic suppression. Her infectious disease doctor is Dr.Shrestha at [facility]. The patient was seen in our [place] sister clinic through much of October and November 2017. At that point she had a draining sinus that eventually closed over. She noted some pain behind her knee surrounding Christmas day and was seen in our emergency room on 10/23/16. During this ER visit she underwent tendon incision and drainage of a left Baker's cyst and clear colored synovial fluid was obtained. Doxycycline was added for a week to her Keflex and rifampin. A CT scan was ordered but I don't think was ever done due to artifacts from hardware. Her wound culture was negative. She has been using iodoform packing. She states her knee is painful when she stands on it for a period of time. She is not systemically unwell. She has a history of scleroderma or without is been under control for some period of time. 11/04/16 the culture that I did  of this  area last week showed coag-negative staph and a few enterococcus faecium. Enterococcus is ampicillin resistant. The coag negative staph is only sensitive to vancomycin and tetracycline. Previous joint infection was methicillin sensitive staph aureus. It seems I misunderstood what she said and that her infectious disease clinic appointment is this Friday and the orthopedic appointment was last Friday. I faxed the culture results to the infectious disease clinic but I've given him a copy of it today. Per the patient and her family they orthopedic surgeon did not want to do anything further to this joint. The patient is not systemically unwell in particular no fever or chills and the pain she has is really minimal 11/18/16; this is a patient who has an infected left knee prosthetic joint. She went to see infectious disease at [facility] who apparently did did not recommend any further antibiotic therapy. She continues to feel the [name], [name]. ([id number]) same episodic pain but no systemic symptoms of particular no fever. The depth of the wound in the popliteal fossa of her left knee is 3.5 versus 5 cm last week 11/25/16 4.5 cm of depth today. Draining exit site. We have treating this with iodoform packing however I really have no expectation of healing and to be truthful the amount of drainage that is coming out of this knee I don't think closure of this site is necessarily considered a positive outcome. Her left knee remains warm but not painful there is an effusion she has no systemic symptoms no fever no chills or and she feels well 12/31/16. Still is same amount of depth today using a skinny at 4.6 cm. There is less drainage. We have been using iodoform packing and I really have no expectation of healing and to be truthful closing this sinus probably is really not the best thing to consider. 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 She is not felt to be a candidate for  a fourth replacement. In the meantime she does not complain of pain except when the dressing is being changed episodically. She has not been systemically unwell no fever or chills or other issues. 01/14/17; after the patient left on 3/7 her daughter tells me the next day the wound had closed over and therefore this is not intact since. Over the last 3 days the patient has noted significant pain in the popliteal fossa of the involved left knee. 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 MSSA was the cause of the original identified joint infection treated at [facility]. 01/21/17; I saw this patient last week at which time there was intense erythema in the popliteal fossa, tenderness and a ballotable fluid collection. I did an IandD on this. Surprisingly the culture of this was negative although as a result of the IandD her tenderness and erythema in the area is resolved. We continue with the iodoform packing READMISSION 04/07/17; this is a patient I know from 2 prior admissions to this clinic starting in January of this year. Her history is essentially as noted above. 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 She has a draining sinus into the popliteal fossa of the left knee. This remains open and drains. She has been using iodoform packing to this area with gauze over the top. To be truthful I haven't really been certain whether having this area close would be helpful or harmful  to this patient. Previously when it's closed she develops a lot more pain in the back of the popliteal fossa and more pain in the knee. She is here today because the packing of the area has become increasingly painful in fact the patient describes this as a "torture chamber" experience. They are packing this wound twice a day. The patient follows with orthopedic surgery  and infectious disease at [facility] although she hasn't seen them since she was last here in March. She denies any systemic symptoms including fever chills etc. She states except for the changing of the dressing her pain when she is mobilizing on the knee is 2/10. She also has a area on the left anterior patella in the incision that is excoriated open. The patient states this is itchy and she does a lot of scratching. They state this was here last time she was here but I have no recollection or documentation of this 04/21/17; patient was readmitted to clinic 2 weeks ago. She has a draining sinus in the left popliteal fossa and a chronically infected left total knee replacement. She also had an excoriation on the left anterior knee. She arrives in clinic today with all of this looking considerably better. The divot in the popliteal fossa is actually closed. READMISSION 07/15/17; the patient arrived today with a real opening of the draining sinus in her left popliteal fossa. This opened about a week ago after developing a painful "knot". Is been draining clear fluid. The patient's pain is a lot better. She is not been systemically unwell. She also is been dealing with an area on the left anterior leg just below the tibial plateau. She also had this [name], [name]. ([id number]) last time. Her daughter states that she scratches this area continuously. I have reviewed the last infectious disease note from August. 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 Much to my chagrin they seem reluctant to tap the joint. A culture of purulent material that came out of the popliteal fossa earlier this year cultured coag-negative staph aureus [not an uncommon cause of prosthetic joint infection]. However they really ignored this and they state that the source was questionable. If they believe this I am wondering why they're so reluctant  to tap the fluid. She has not seen orthopedics Wound History Patient presents with 2 open wounds that have been present for approximately 07/07/2017. Patient has been treating wounds in the following manner: idoform packing and dressing. Laboratory tests have not been performed in the last month. Patient reportedly has not tested positive for an antibiotic resistant organism. Patient reportedly has not tested positive for osteomyelitis. Patient reportedly has not had testing performed to evaluate circulation in the legs. Patient experiences the following problems associated with their wounds: swelling. Patient History Information obtained from Patient. Allergies Zithromax, Sulfa (Sulfonamide Antibiotics), prednisone, adhesive tape Family History Cancer - Mother, Diabetes - Siblings, Father, Heart Disease - Father, Hypertension - Father, Seizures - Child, No family history of Kidney Disease, Lung Disease, Stroke, Thyroid Problems, Tuberculosis. Social History Never smoker, Marital Status - Widowed, Alcohol Use - Rarely, Drug Use - No History, Caffeine Use - Never. Medical History Eyes Patient has history of Cataracts - Removed Denies history of Glaucoma, Optic Neuritis Ear/Nose/Mouth/Throat Denies history of Chronic sinus problems/congestion, Middle ear problems Hematologic/Lymphatic Denies history of Anemia, Hemophilia, Human Immunodeficiency Virus, Lymphedema Respiratory Patient has history of Sleep Apnea - 2L O2 at night  Denies history of Aspiration, Asthma, Chronic Obstructive Pulmonary Disease (COPD), Pneumothorax, Tuberculosis Cardiovascular Patient has history of Arrhythmia, Hypertension Denies history of Angina, Congestive Heart Failure, Coronary Artery Disease, Deep Vein Thrombosis, Hypotension, Myocardial Infarction, Peripheral Arterial Disease, Peripheral Venous Disease, Phlebitis, Vasculitis Gastrointestinal AALINA, BREGE ([id number]) Denies history of Cirrhosis ,  Colitis, Crohn s, Hepatitis A, Hepatitis B, Hepatitis C Endocrine Denies history of Type I Diabetes, Type II Diabetes Genitourinary Denies history of End Stage Renal Disease Immunological Patient has history of Scleroderma Denies history of Lupus Erythematosus, Raynaud s Integumentary (Skin) Denies history of History of Burn, History of pressure wounds Musculoskeletal Denies history of Gout, Rheumatoid Arthritis, Osteoarthritis, Osteomyelitis Neurologic Denies history of Dementia, Neuropathy, Quadriplegia, Paraplegia, Seizure Disorder Oncologic Denies history of Received Chemotherapy, Received Radiation Psychiatric Denies history of Anorexia/bulimia, Confinement Anxiety Objective Constitutional Sitting or standing Blood Pressure is within target range for patient.. Pulse regular and within target range for patient.Marland Kitchen Respirations regular, non-labored and within target range.. Temperature is normal and within the target range for the patient.Marland Kitchen appears in no distress. Vitals Time Taken: 10:26 AM, Height: 66 in, Source: Stated, Weight: 218.3 lbs, Source: Measured, BMI: 35.2, Temperature: 97.8 F, Pulse: 59 bpm, Respiratory Rate: 16 breaths/min, Blood Pressure: 122/50 mmHg. Eyes Conjunctivae clear. No discharge. Respiratory Respiratory effort is easy and symmetric bilaterally. Rate is normal at rest and on room air.. Cardiovascular Heart rhythm and rate regular, without murmur or gallop.Rogers Blocker, Blima Singer ([id number]) Lymphatic None palpable in the popliteal or inguinal area. Musculoskeletal As usual she has a swollen, warm left knee with clear evidence of an effusion.Marland Kitchen Psychiatric No evidence of depression, anxiety, or agitation. Calm, cooperative, and communicative. Appropriate interactions and affect.. General Notes: Wound exam; small probing hole in the popliteal fossa. This has a scant amount of what looks like synovial fluid. This is compatible with the sinus tract that she  develops. Iodoform to this area The area on the left anterior knee does not have an epithelialized surface what looks to be an excoriated subcutaneous surface. Using a #5 curet I debrided this hopeful to get a better-looking surface on the top of this. We will use silver collagen Integumentary (Hair, Skin) There is no rash and no overt erythema on the left knee or extending above. It. Wound #4 status is Open. Original cause of wound was Gradually Appeared. The wound is located on the Left,Anterior Knee. The wound measures 3.3cm length x 2.5cm width x 0.1cm depth; 6.48cm^2 area and 0.648cm^3 volume. There is no tunneling or undermining noted. There is a large amount of serosanguineous drainage noted. The wound margin is distinct with the outline attached to the wound base. There is large (67-100%) red granulation within the wound bed. There is a small (1-33%) amount of necrotic tissue within the wound bed including Eschar and Adherent Slough. Periwound temperature was noted as No Abnormality. The periwound has tenderness on palpation. Wound #5 status is Open. Original cause of wound was Gradually Appeared. The wound is located on the Left,Posterior Knee. The wound measures 0.3cm length x 0.1cm width x 2.1cm depth; 0.024cm^2 area and 0.049cm^3 volume. There is no tunneling or undermining noted. There is a large amount of serous drainage noted. The wound margin is distinct with the outline attached to the wound base. There is large (67-100%) red granulation within the wound bed. There is no necrotic tissue within the wound bed. Periwound temperature was noted as No Abnormality. The periwound has tenderness on palpation. 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 ([id number]) Procedures Wound #4 Pre-procedure diagnosis of Wound #4 is a To be determined located on the Left,Anterior Knee . There was a Skin/Subcutaneous Tissue Debridement ([id number]) debridement with total area of 8.25 sq cm performed by Ricard Dillon, MD. with the following instrument(s): Curette to remove Viable and Non-Viable tissue/material including Exudate, Fibrin/Slough, and Subcutaneous after achieving pain control using Lidocaine 4% Topical Solution. A time out was conducted at 10:53, prior to the start of the procedure. A Moderate amount of bleeding was controlled with Silver Nitrate. The procedure was tolerated well with a pain level of 0 throughout and a pain level of 0 following the procedure. Post Debridement Measurements: 3.3cm length x 2.5cm width x 0.1cm depth; 0.648cm^3 volume. Character of Wound/Ulcer Post Debridement requires further debridement. Post procedure Diagnosis Wound #4: Same as Pre-Procedure Plan Wound Cleansing: Wound #4 Left,Anterior Knee: Clean wound with Normal Saline. Cleanse wound with mild soap and water May Shower, gently pat wound dry prior to applying new dressing. Wound #5 Left,Posterior Knee: Clean wound with Normal Saline. Cleanse wound with mild soap and water May Shower, gently pat wound dry prior to applying new dressing. Anesthetic: Wound #4 Left,Anterior Knee: Topical Lidocaine 4% cream applied to wound bed prior to debridement Wound #5 Left,Posterior Knee: Topical Lidocaine 4% cream applied to wound bed prior to debridement Skin Barriers/Peri-Wound Care: Wound #4 Left,Anterior Knee: Skin Prep Wound #5 Left,Posterior Knee: Skin Prep Primary Wound Dressing: Wound #4 Left,Anterior Knee: Hydrogel Prisma Ag - moisten with hydrogel Wound #5 Left,Posterior Knee: Iodoform packing Gauze Secondary Dressing: [name], [name] ([id number]) Wound #4  Left,Anterior Knee: Dry Gauze Boardered Foam Dressing Other - coban to secure Wound #5 Left,Posterior Knee: Dry Gauze Boardered Foam Dressing Other - coban to secure Dressing Change Frequency: Wound #4 Left,Anterior Knee: Change dressing every other day. Wound #5 Left,Posterior Knee: Change dressing every day. Follow-up Appointments: Wound #4 Left,Anterior Knee: Return Appointment in 1 week. Wound #5 Left,Posterior Knee: Return Appointment in 1 week. Edema Control: Wound #4 Left,Anterior Knee: Elevate legs to the level of the heart and pump ankles as often as possible Wound #5 Left,Posterior Knee: Elevate legs to the level of the heart and pump ankles as often as possible Additional Orders / Instructions: Wound #4 Left,Anterior Knee: Increase protein intake. Wound #5 Left,Posterior Knee: Increase protein intake. #1 iodoform packing to the left popliteal fossa which the daughter it already started as she had leftover supplies at home. #2 collagen to the area on the left anterior knee which I think is an area chronically inflamed skin probably from scratching. We will cover this with border foam and hopefully get an epithelialized surface. The patient's daughter states this never really healed although I think we had healed it out last time Electronic Signature(s) Signed: 07/15/2017 5:33:43 PM By: Linton Ham MD Entered By: Linton Ham on 07/15/2017 12:35:38 [name] ([id number]) -------------------------------------------------------------------------------- ROS/PFSH Details [name]. Date of Service: 07/15/2017 10:30 AM Medical Record Patient Account Number: [id number] Number: Treating RN: Ahmed Prima [birth year] (83 y.o. Other Clinician: Date of Birth/Sex: Female) Treating Lupie Sawa Primary Care Provider: Loura Pardon Provider/Extender: G Referring Provider: Loura Pardon Weeks in Treatment: 0 Label Progress Note  Print Version as History  and Physical for this encounter Information Obtained From Patient Wound History Do you currently have one or more open woundso Yes How many open wounds do you currently haveo 2 Approximately how long have you had your woundso 07/07/2017 How have you been treating your wound(s) until nowo idoform packing and dressing Has your wound(s) ever healed and then re-openedo No Have you had any lab work done in the past montho No Have you tested positive for an antibiotic resistant organism (MRSA, No VRE)o Have you tested positive for osteomyelitis (bone infection)o No Have you had any tests for circulation on your legso No Have you had other problems associated with your woundso Swelling Eyes Medical History: Positive for: Cataracts - Removed Negative for: Glaucoma; Optic Neuritis Ear/Nose/Mouth/Throat Medical History: Negative for: Chronic sinus problems/congestion; Middle ear problems Hematologic/Lymphatic Medical History: Negative for: Anemia; Hemophilia; Human Immunodeficiency Virus; Lymphedema Respiratory Medical History: Positive for: Sleep Apnea - 2L O2 at night Negative for: Aspiration; Asthma; Chronic Obstructive Pulmonary Disease (COPD); Pneumothorax; Tuberculosis [name], [name] ([id number]) Cardiovascular Medical History: Positive for: Arrhythmia; Hypertension Negative for: Angina; Congestive Heart Failure; Coronary Artery Disease; Deep Vein Thrombosis; Hypotension; Myocardial Infarction; Peripheral Arterial Disease; Peripheral Venous Disease; Phlebitis; Vasculitis Gastrointestinal Medical History: Negative for: Cirrhosis ; Colitis; Crohnos; Hepatitis A; Hepatitis B; Hepatitis C Endocrine Medical History: Negative for: Type I Diabetes; Type II Diabetes Genitourinary Medical History: Negative for: End Stage Renal Disease Immunological Medical History: Positive for: Scleroderma Negative for: Lupus Erythematosus; Raynaudos Integumentary  (Skin) Medical History: Negative for: History of Burn; History of pressure wounds Musculoskeletal Medical History: Negative for: Gout; Rheumatoid Arthritis; Osteoarthritis; Osteomyelitis Neurologic Medical History: Negative for: Dementia; Neuropathy; Quadriplegia; Paraplegia; Seizure Disorder Oncologic Medical History: Negative for: Received Chemotherapy; Received Radiation Psychiatric [name], [name] ([id number]) Medical History: Negative for: Anorexia/bulimia; Confinement Anxiety HBO Extended History Items Eyes: Cataracts Immunizations Pneumococcal Vaccine: Received Pneumococcal Vaccination: Yes Immunization Notes: up to date also flu shot up to date Family and Social History Cancer: Yes - Mother; Diabetes: Yes - Siblings, Father; Heart Disease: Yes - Father; Hypertension: Yes - Father; Kidney Disease: No; Lung Disease: No; Seizures: Yes - Child; Stroke: No; Thyroid Problems: No; Tuberculosis: No; Never smoker; Marital Status - Widowed; Alcohol Use: Rarely; Drug Use: No History; Caffeine Use: Never; Financial Concerns: No; Food, Clothing or Shelter Needs: No; Support System Lacking: No; Transportation Concerns: No; Advanced Directives: Yes (Not Provided); Patient does not want information on Advanced Directives; Do not resuscitate: No; Living Will: Yes (Not Provided); Medical Power of Attorney: Yes - [name] Glad (Not Provided) Electronic Signature(s) Signed: 07/15/2017 5:33:43 PM By: Linton Ham MD Signed: 07/15/2017 5:51:06 PM By: Alric Quan Entered By: Alric Quan on 07/15/2017 10:33:22 [name] ([id number]) -------------------------------------------------------------------------------- SuperBill Details [name]. Date of Service: 07/15/2017 Medical Record Patient Account Number: [id number] Number: Treating RN: Ahmed Prima [birth year] (83 y.o. Other Clinician: Date of Birth/Sex: Female) Treating Morrill Bomkamp Primary  Care Provider: Loura Pardon Provider/Extender: G Referring Provider: Loura Pardon Weeks in Treatment: 0 Diagnosis Coding ICD-10 Codes Code Description M71.062 Abscess of bursa, left knee Methicillin susceptible Staphylococcus aureus infection as the cause of diseases B95.61 classified elsewhere L02.416 Cutaneous abscess of left lower limb Non-pressure chronic ulcer of unspecified part of left lower leg with other specified L97.928 severity Facility Procedures CPT4: Description Modifier Quantity Code [id number] 99212 - WOUND CARE VISIT-LEV 2 EST PT 1 CPT4: 18299371 11042 - DEB SUBQ TISSUE 20 SQ CM/< 1 ICD-10 Description Diagnosis L97.928 Non-pressure chronic ulcer of unspecified part  of left lower leg with other specified severity Physician Procedures CPT4: Description Modifier Quantity Code [id number] 05110 - WC PHYS LEVEL 3 - EST PT 25 1 ICD-10 Description Diagnosis M71.062 Abscess of bursa, left knee L97.928 Non-pressure chronic ulcer of unspecified part of left lower leg with other specified severity CPT4: 2111735 11042 - WC PHYS SUBQ TISS 20 SQ CM 1 ICD-10 Description Diagnosis L97.928 MARLICIA, SROKA ([id number]) Electronic Signature(s) Unsigned Previous Signature: 07/15/2017 5:33:43 PM Version By: Linton Ham MD Entered By: Alric Quan on 07/16/2017 07:57:37 Signature(s): Date(s):

## 2017-07-21 ENCOUNTER — Ambulatory Visit
Admission: RE | Admit: 2017-07-21 | Discharge: 2017-07-21 | Disposition: A | Discharge status: Home / Self Care | Payer: Medicare Other | Source: Ambulatory Visit | Attending: Family Medicine | Admitting: Family Medicine

## 2017-07-21 DIAGNOSIS — Z1231 Encounter for screening mammogram for malignant neoplasm of breast: Secondary | ICD-10-CM

## 2017-07-22 ENCOUNTER — Encounter: Payer: Self-pay | Admitting: *Deleted

## 2017-07-22 ENCOUNTER — Encounter: Payer: Medicare Other | Admitting: Internal Medicine

## 2017-07-22 DIAGNOSIS — L97928 Non-pressure chronic ulcer of unspecified part of left lower leg with other specified severity: Secondary | ICD-10-CM | POA: Diagnosis not present

## 2017-07-24 NOTE — Progress Notes (Signed)
[name], [name] ([id number]) Visit Report for 07/22/2017 HPI Details [name], [name]. Date of Service: 07/22/2017 1:15 PM Medical Record Patient Account Number: [id number] Number: Treating RN: [birth year] (83 y.o. Other Clinician: Date of Birth/Sex: Female) Treating Lyrique Hakim Primary Care Provider: Loura Pardon Provider/Extender: G Referring Provider: Loura Pardon Weeks in Treatment: 1 History of Present Illness HPI Description: 10/28/16; this is an 83 year old woman who arrives with a complicated medical issue. 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 Initially in 2000 and apparently most recently in 2014. 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 Indeed she was hospitalized on 02/13/16 and discharged on 02/19/16. She was taken to the OR and it was found that the popliteal abscess was communicating with her joint space. Signed to feel fluid grew MSSA. Patient was treated with IandD of the abscess. She completed IV cefazolin on 03/28/16 and since then has been on Keflex and rifampin for chronic suppression. Her infectious disease doctor is Dr.Shrestha at [facility]. The patient was seen in our [place] sister clinic through much of October and November 2017. At that point she had a draining sinus that eventually closed over. She noted some pain behind her knee surrounding Christmas day and was seen in our emergency room on 10/23/16. During this ER visit she underwent tendon incision and drainage of a left Baker's cyst and clear colored synovial fluid was obtained. Doxycycline was added for a week to her Keflex and rifampin. A CT scan was ordered but I don't think was ever done due to artifacts from hardware. Her wound culture was negative. She has been using iodoform packing. She states her knee is painful when she stands on it for a period of time. She is not systemically unwell. She has a history of scleroderma  or without is been under control for some period of time. 11/04/16 the culture that I did of this area last week showed coag-negative staph and a few enterococcus faecium. Enterococcus is ampicillin resistant. The coag negative staph is only sensitive to vancomycin and tetracycline. Previous joint infection was methicillin sensitive staph aureus. It seems I misunderstood what she said and that her infectious disease clinic appointment is this Friday and the orthopedic appointment was last Friday. I faxed the culture results to the infectious disease clinic but I've given him a copy of it today. Per the patient and her family they orthopedic surgeon did not want to do anything further to this joint. The patient is not systemically unwell in particular no fever or chills and the pain she has is really minimal 11/18/16; this is a patient who has an infected left knee prosthetic joint. She went to see infectious disease at [facility] who apparently did did not recommend any further antibiotic therapy. She continues to feel the same episodic pain but no systemic symptoms of particular no fever. The depth of the wound in the popliteal fossa of her left knee is 3.5 versus 5 cm last week 11/25/16 4.5 cm of depth today. Draining exit site. We have treating this with iodoform packing however I really have no expectation of healing and to be truthful the amount of drainage that is coming out of this knee I don't think closure of this site is necessarily considered a positive outcome. Her left knee remains warm but not painful there is an effusion she has no systemic symptoms no fever no chills or and she feels well [name], [name]. ([id number]) 12/31/16.  Still is same amount of depth today using a skinny at 4.6 cm. There is less drainage. We have been using iodoform packing and I really have no expectation of healing and to be truthful closing this sinus probably is really not the best thing to consider. The patient  has an infected artificial knee joint on the left. She is not felt to be a candidate for a fourth replacement. In the meantime she does not complain of pain except when the dressing is being changed episodically. She has not been systemically unwell no fever or chills or other issues. 01/14/17; after the patient left on 3/7 her daughter tells me the next day the wound had closed over and therefore this is not intact since. Over the last 3 days the patient has noted significant pain in the popliteal fossa of the involved left knee. 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 MSSA was the cause of the original identified joint infection treated at [facility]. 01/21/17; I saw this patient last week at which time there was intense erythema in the popliteal fossa, tenderness and a ballotable fluid collection. I did an IandD on this. Surprisingly the culture of this was negative although as a result of the IandD her tenderness and erythema in the area is resolved. We continue with the iodoform packing READMISSION 04/07/17; this is a patient I know from 2 prior admissions to this clinic starting in January of this year. Her history is essentially as noted above. 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 She has a draining sinus into the popliteal fossa of the left knee. This remains open and drains. She has been using iodoform packing to this area with gauze over the top. To be truthful I haven't really been certain whether having this area close would be helpful or harmful to this patient. Previously when it's closed she develops a lot more pain in the back of the popliteal fossa and more pain in the knee. She is here today because the packing of the area has become increasingly painful in fact the patient describes this as a "torture chamber"  experience. They are packing this wound twice a day. The patient follows with orthopedic surgery and infectious disease at [facility] although she hasn't seen them since she was last here in March. She denies any systemic symptoms including fever chills etc. She states except for the changing of the dressing her pain when she is mobilizing on the knee is 2/10. She also has a area on the left anterior patella in the incision that is excoriated open. The patient states this is itchy and she does a lot of scratching. They state this was here last time she was here but I have no recollection or documentation of this 04/21/17; patient was readmitted to clinic 2 weeks ago. She has a draining sinus in the left popliteal fossa and a chronically infected left total knee replacement. She also had an excoriation on the left anterior knee. She arrives in clinic today with all of this looking considerably better. The divot in the popliteal fossa is actually closed. READMISSION 07/15/17; the patient arrived today with a real opening of the draining sinus in her left popliteal fossa. This opened about a week ago after developing a painful "knot". Is been draining clear fluid. The patient's pain is a lot better. She is not been systemically  unwell. oShe also is been dealing with an area on the left anterior leg just below the tibial plateau. She also had this last time. Her daughter states that she scratches this area continuously. I have reviewed the last infectious disease note from August. 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 Much to my chagrin they seem reluctant to tap the joint. A culture of purulent material that came out of the popliteal fossa earlier this year cultured coag-negative staph aureus [not an uncommon cause of prosthetic joint infection]. However they really ignored this and they state that the source was  questionable. If they believe this I am wondering why they're so reluctant to tap the fluid. She has not seen orthopedics REA, KALAMA. ([id number]) 07/22/17; patient's sinus tract in the popliteal fossa about 2.5 cm. The area on the anterior part of her knee looks a lot better. We have been using iodoform packing to the popliteal fossa, collagen to the anterior part of her knee. She is not experiencing undue pain or systemic symptoms Electronic Signature(s) Signed: 07/22/2017 4:28:59 PM By: Linton Ham MD Entered By: Linton Ham on 07/22/2017 14:04:12 [name] ([id number]) -------------------------------------------------------------------------------- Physical Exam Details Patient Name: [name]. Date of Service: 07/22/2017 1:15 PM Medical Record Patient Account Number: [id number] Number: Treating RN: Dec 25, 1933 (83 y.o. Other Clinician: Date of Birth/Sex: Female) Treating Jameire Kouba Primary Care Provider: Loura Pardon Provider/Extender: G Referring Provider: Loura Pardon Weeks in Treatment: 1 Constitutional Sitting or standing Blood Pressure is within target range for patient.. Pulse regular and within target range for patient.Marland Kitchen Respirations regular, non-labored and within target range.. Temperature is normal and within the target range for the patient.Marland Kitchen appears in no distress. Notes Wound exam; small programming: The popliteal fossa 2.5 cm. No discharge today. No surrounding tenderness or infection oThe area on the left anterior knee is fully epithelialized except for 2 small areas. No debridement was necessary Electronic Signature(s) Signed: 07/22/2017 4:28:59 PM By: Linton Ham MD Entered By: Linton Ham on 07/22/2017 14:05:05 [name] ([id number]) -------------------------------------------------------------------------------- Physician Orders Details Patient Name: [name]. Date of Service: 07/22/2017 1:15 PM Medical  Record Patient Account Number: [id number] Number: Treating RN: Montey Hora 20-Feb-1934 (83 y.o. Other Clinician: Date of Birth/Sex: Female) Treating Taytum Wheller Primary Care Provider: Loura Pardon Provider/Extender: G Referring Provider: Loura Pardon Weeks in Treatment: 1 Verbal / Phone Orders: No Diagnosis Coding Wound Cleansing Wound #4 Left,Anterior Knee o Clean wound with Normal Saline. o Cleanse wound with mild soap and water o May Shower, gently pat wound dry prior to applying new dressing. Wound #5 Left,Posterior Knee o Clean wound with Normal Saline. o Cleanse wound with mild soap and water o May Shower, gently pat wound dry prior to applying new dressing. Anesthetic Wound #4 Left,Anterior Knee o Topical Lidocaine 4% cream applied to wound bed prior to debridement Wound #5 Left,Posterior Knee o Topical Lidocaine 4% cream applied to wound bed prior to debridement Skin Barriers/Peri-Wound Care Wound #4 Left,Anterior Knee o Skin Prep Wound #5 Left,Posterior Knee o Skin Prep Primary Wound Dressing Wound #4 Left,Anterior Knee o Hydrogel o Prisma Ag - moisten with hydrogel Wound #5 Left,Posterior Knee o Iodoform packing Gauze Secondary Dressing OHANNA, GASSERT ([id number]) Wound #4 Left,Anterior Knee o Dry Gauze o Boardered Foam Dressing o Other - coban to secure Wound #5 Left,Posterior Knee o Dry Gauze o Boardered Foam Dressing o Other - coban  to secure Dressing Change Frequency Wound #4 Left,Anterior Knee o Change dressing every other day. Wound #5 Left,Posterior Knee o Change dressing every day. Follow-up Appointments Wound #4 Left,Anterior Knee o Return Appointment in 2 weeks. Wound #5 Left,Posterior Knee o Return Appointment in 2 weeks. Edema Control Wound #4 Left,Anterior Knee o Elevate legs to the level of the heart and pump ankles as often as possible Wound #5 Left,Posterior Knee o  Elevate legs to the level of the heart and pump ankles as often as possible Additional Orders / Instructions Wound #4 Left,Anterior Knee o Increase protein intake. Wound #5 Left,Posterior Knee o Increase protein intake. Electronic Signature(s) Signed: 07/22/2017 4:28:59 PM By: Linton Ham MD Signed: 07/22/2017 4:40:38 PM By: Montey Hora Entered By: Montey Hora on 07/22/2017 13:45:47 Liou, Blima Singer ([id number]) -------------------------------------------------------------------------------- Problem List Details Patient Name: DEMETRICE, AMSTUTZ. Date of Service: 07/22/2017 1:15 PM Medical Record Patient Account Number: [id number] Number: Treating RN: 24-Mar-1934 (83 y.o. 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 Date of Service: 07/22/2017 1:15 PM Medical Record Patient Account Number: [id number] Number: Treating RN: [birth year] (83 y.o. Other Clinician: Date of Birth/Sex: Female) Treating Merideth Bosque Primary Care Provider: Loura Pardon Provider/Extender: G Referring  Provider: Loura Pardon Weeks in Treatment: 1 Subjective History of Present Illness (HPI) 10/28/16; this is an 83 year old woman who arrives with a complicated medical issue. 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 Initially in 2000 and apparently most recently in 2014. 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 Indeed she was hospitalized on 02/13/16 and discharged on 02/19/16. She was taken to the OR and it was found that the popliteal abscess was communicating with her joint space. Signed to feel fluid grew MSSA. Patient was treated with IandD of the abscess. She completed IV cefazolin on 03/28/16 and since then has been on Keflex and rifampin for chronic suppression. Her infectious disease doctor is Dr.Shrestha at [facility]. The patient was seen in our [place] sister clinic through much of October and November 2017. At that point she had a draining sinus that eventually closed over. She noted some pain behind her knee surrounding Christmas day and was seen in our emergency room on 10/23/16. During this ER visit she underwent tendon incision and drainage of a left Baker's cyst and clear colored synovial fluid was obtained. Doxycycline was added for a week to her Keflex and rifampin. A CT scan was ordered but I don't think was ever done due to artifacts from hardware. Her wound culture was negative. She has been using iodoform packing. She states her knee is painful when she stands on it for a period of time. She is not systemically unwell. She has a history of scleroderma or without is been under control for some period of time. 11/04/16 the culture that I did of this  area last week showed coag-negative staph and a few enterococcus faecium. Enterococcus is ampicillin resistant. The coag negative staph is only sensitive to vancomycin and tetracycline. Previous joint infection was methicillin sensitive staph aureus. It seems I misunderstood  what she said and that her infectious disease clinic appointment is this Friday and the orthopedic appointment was last Friday. I faxed the culture results to the infectious disease clinic but I've given him a copy of it today. Per the patient and her family they orthopedic surgeon did not want to do anything further to this joint. The patient is not systemically unwell in particular no fever or chills and the pain she has is really minimal 11/18/16; this is a patient who has an infected left knee prosthetic joint. She went to see infectious disease at [facility] who apparently did did not recommend any further antibiotic therapy. She continues to feel the same episodic pain but no systemic symptoms of particular no fever. The depth of the wound in the popliteal fossa of her left knee is 3.5 versus 5 cm last week 11/25/16 4.5 cm of depth today. Draining exit site. We have treating this with iodoform packing however I really have no expectation of healing and to be truthful the amount of drainage that is coming out of this knee I don't think closure of this site is necessarily considered a positive outcome. Her left knee remains warm but not painful there is an effusion she has no systemic symptoms no fever no chills or and she feels well 12/31/16. Still is same amount of depth today using a skinny at 4.6 cm. There is less drainage. We have been using iodoform packing and I really have no expectation of healing and to be truthful closing this sinus LETASHA, KERSHAW. ([id number]) probably is really not the best thing to consider. 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 She is not felt to be a candidate for a fourth replacement. In the meantime she does not complain of pain except when the dressing is being changed episodically. She has not been systemically unwell no fever or chills or other issues. 01/14/17; after the patient left on 3/7 her daughter tells me the next day the wound  had closed over and therefore this is not intact since. Over the last 3 days the patient has noted significant pain in the popliteal fossa of the involved left knee. 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 MSSA was the cause of the original identified joint infection treated at [facility]. 01/21/17; I saw this patient last week at which time there was intense erythema in the popliteal fossa, tenderness and a ballotable fluid collection. I did an IandD on this. Surprisingly the culture of this was negative although as a result of the IandD her tenderness and erythema in the area is resolved. We continue with the iodoform packing READMISSION 04/07/17; this is a patient I know from 2 prior admissions to this clinic starting in January of this year. Her history is essentially as noted above. 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 She has a draining sinus into the popliteal fossa of the left knee. This remains open and drains. She has been using iodoform packing to this area with gauze over the top. To be truthful I haven't really been certain whether having this area close would be helpful or  harmful to this patient. Previously when it's closed she develops a lot more pain in the back of the popliteal fossa and more pain in the knee. She is here today because the packing of the area has become increasingly painful in fact the patient describes this as a "torture chamber" experience. They are packing this wound twice a day. The patient follows with orthopedic surgery and infectious disease at [facility] although she hasn't seen them since she was last here in March. She denies any systemic symptoms including fever chills etc. She states except for the changing of the dressing her pain when she is mobilizing on the knee is 2/10. She also has a area  on the left anterior patella in the incision that is excoriated open. The patient states this is itchy and she does a lot of scratching. They state this was here last time she was here but I have no recollection or documentation of this 04/21/17; patient was readmitted to clinic 2 weeks ago. She has a draining sinus in the left popliteal fossa and a chronically infected left total knee replacement. She also had an excoriation on the left anterior knee. She arrives in clinic today with all of this looking considerably better. The divot in the popliteal fossa is actually closed. READMISSION 07/15/17; the patient arrived today with a real opening of the draining sinus in her left popliteal fossa. This opened about a week ago after developing a painful "knot". Is been draining clear fluid. The patient's pain is a lot better. She is not been systemically unwell. She also is been dealing with an area on the left anterior leg just below the tibial plateau. She also had this last time. Her daughter states that she scratches this area continuously. I have reviewed the last infectious disease note from August. 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 Much to my chagrin they seem reluctant to tap the joint. A culture of purulent material that came out of the popliteal fossa earlier this year cultured coag-negative staph aureus [not an uncommon cause of prosthetic joint infection]. However they really ignored this and they state that the source was questionable. If they believe this I am wondering why they're so reluctant to tap the fluid. She has not seen orthopedics 07/22/17; patient's sinus tract in the popliteal fossa about 2.5 cm. The area on the anterior part of her knee looks a lot better. We have been using iodoform packing to the popliteal fossa, collagen to the anterior part MADISYN, MAWHINNEY. ([id number]) of her knee. She  is not experiencing undue pain or systemic symptoms Objective Constitutional Sitting or standing Blood Pressure is within target range for patient.. Pulse regular and within target range for patient.Marland Kitchen Respirations regular, non-labored and within target range.. Temperature is normal and within the target range for the patient.Marland Kitchen appears in no distress. Vitals Time Taken: 1:24 PM, Height: 66 in, Weight: 218.3 lbs, BMI: 35.2, Temperature: 99.0 F, Pulse: 57 bpm, Respiratory Rate: 18 breaths/min, Blood Pressure: 121/58 mmHg. General Notes: Wound exam; small programming: The popliteal fossa 2.5 cm. No discharge today. No surrounding tenderness or infection The area on the left anterior knee is fully epithelialized except for 2 small areas. No debridement was necessary Integumentary (Hair, Skin) Wound #4 status is Open. Original cause of wound was Gradually Appeared. The wound is located on the Left,Anterior Knee. The wound measures 0.9cm length x 0.8cm width x 0.1cm depth;  0.565cm^2 area and 0.057cm^3 volume. There is no tunneling or undermining noted. There is a large amount of serosanguineous drainage noted. The wound margin is distinct with the outline attached to the wound base. There is large (67-100%) red granulation within the wound bed. There is no necrotic tissue within the wound bed. Periwound temperature was noted as No Abnormality. The periwound has tenderness on palpation. Wound #5 status is Open. Original cause of wound was Gradually Appeared. The wound is located on the Left,Posterior Knee. The wound measures 0.2cm length x 0.2cm width x 1.8cm depth; 0.031cm^2 area and 0.057cm^3 volume. There is no tunneling or undermining noted. There is a large amount of purulent drainage noted. The wound margin is distinct with the outline attached to the wound base. There is large (67-100%) red granulation within the wound bed. There is no necrotic tissue within the wound bed. Periwound  temperature was noted as No Abnormality. The periwound has tenderness on palpation. Assessment Active Problems ICD-10 M71.062 - Abscess of bursa, left knee LEANETTE, EUTSLER. ([id number]) B95.61 - Methicillin susceptible Staphylococcus aureus infection as the cause of diseases classified elsewhere L02.416 - Cutaneous abscess of left lower limb L97.928 - Non-pressure chronic ulcer of unspecified part of left lower leg with other specified severity Plan Wound Cleansing: Wound #4 Left,Anterior Knee: Clean wound with Normal Saline. Cleanse wound with mild soap and water May Shower, gently pat wound dry prior to applying new dressing. Wound #5 Left,Posterior Knee: Clean wound with Normal Saline. Cleanse wound with mild soap and water May Shower, gently pat wound dry prior to applying new dressing. Anesthetic: Wound #4 Left,Anterior Knee: Topical Lidocaine 4% cream applied to wound bed prior to debridement Wound #5 Left,Posterior Knee: Topical Lidocaine 4% cream applied to wound bed prior to debridement Skin Barriers/Peri-Wound Care: Wound #4 Left,Anterior Knee: Skin Prep Wound #5 Left,Posterior Knee: Skin Prep Primary Wound Dressing: Wound #4 Left,Anterior Knee: Hydrogel Prisma Ag - moisten with hydrogel Wound #5 Left,Posterior Knee: Iodoform packing Gauze Secondary Dressing: Wound #4 Left,Anterior Knee: Dry Gauze Boardered Foam Dressing Other - coban to secure Wound #5 Left,Posterior Knee: Dry Gauze Boardered Foam Dressing Other - coban to secure Dressing Change Frequency: Wound #4 Left,Anterior Knee: Change dressing every other day. Wound #5 Left,Posterior Knee: NETASHA, WEHRLI. ([id number]) Change dressing every day. Follow-up Appointments: Wound #4 Left,Anterior Knee: Return Appointment in 2 weeks. Wound #5 Left,Posterior Knee: Return Appointment in 2 weeks. Edema Control: Wound #4 Left,Anterior Knee: Elevate legs to the level of the heart and pump ankles as often  as possible Wound #5 Left,Posterior Knee: Elevate legs to the level of the heart and pump ankles as often as possible Additional Orders / Instructions: Wound #4 Left,Anterior Knee: Increase protein intake. Wound #5 Left,Posterior Knee: Increase protein intake. o #1 this patient's wounds appear to be progressing much the same as they have in the past. Popliteal fossae areas no longer draining and appears to be filling in compatible with a draining sinus #2 the area on the anterior knee looks close to completely epithelialized Electronic Signature(s) Signed: 07/22/2017 4:28:59 PM By: Linton Ham MD Entered By: Linton Ham on 07/22/2017 14:10:54 [name] ([id number]) -------------------------------------------------------------------------------- SuperBill Details Patient Name: [name]. Date of Service: 07/22/2017 Medical Record Patient Account Number: [id number] Number: Treating RN: [birth year] (83 y.o. 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

## 2017-07-24 NOTE — Progress Notes (Signed)
[name], [name] ([id number]) Visit Report for 07/22/2017 Arrival Information Details [name], [name]. Date of Service: 07/22/2017 1:15 PM Medical Record Patient Account Number: [id number] Number: Treating RN: [name] 10-02-1934 (83 y.o. Other Clinician: Date of Birth/Sex: Female) Treating [name] Primary Care [name]: [name] [name]/Extender: G Referring [name]: [name] Weeks in Treatment: 1 Visit Information History Since Last Visit Added or deleted any medications: No Patient Arrived: Walker Any new allergies or adverse reactions: No Arrival Time: 13:23 Had a fall or experienced change in No Accompanied By: dtr activities of daily living that may affect Transfer Assistance: None risk of falls: Patient Identification Verified: Yes Signs or symptoms of abuse/neglect since last No Secondary Verification Process Completed: Yes visito Patient Requires Transmission-Based No Hospitalized since last visit: No Precautions: Has Dressing in Place as Prescribed: Yes Patient Has Alerts: No Pain Present Now: No Electronic Signature(s) Signed: 07/22/2017 4:40:38 PM By: [name] Entered By: [name] on 07/22/2017 13:23:46 [name], [name] Singer ([id number]) -------------------------------------------------------------------------------- Clinic Level of Care Assessment Details [name]. Date of Service: 07/22/2017 1:15 PM Medical Record Patient Account Number: [id number] Number: Treating RN: [name] Jan 13, 1934 (83 y.o. Other Clinician: Date of Birth/Sex: Female) Treating ROBSON, [place] Primary Care [name]: [name] [name]/Extender: G Referring [name]: [name] Weeks in Treatment: 1 Clinic Level of Care Assessment Items TOOL 4 Quantity Score []  - Use when only an EandM is performed on FOLLOW-UP visit 0 ASSESSMENTS - Nursing Assessment / Reassessment X - Reassessment of  Co-morbidities (includes updates in patient status) 1 10 X - Reassessment of Adherence to Treatment Plan 1 5 ASSESSMENTS - Wound and Skin Assessment / Reassessment []  - Simple Wound Assessment / Reassessment - one wound 0 X - Complex Wound Assessment / Reassessment - multiple wounds 2 5 []  - Dermatologic / Skin Assessment (not related to wound area) 0 ASSESSMENTS - Focused Assessment []  - Circumferential Edema Measurements - multi extremities 0 []  - Nutritional Assessment / Counseling / Intervention 0 X - Lower Extremity Assessment (monofilament, tuning fork, pulses) 1 5 []  - Peripheral Arterial Disease Assessment (using hand held doppler) 0 ASSESSMENTS - Ostomy and/or Continence Assessment and Care []  - Incontinence Assessment and Management 0 []  - Ostomy Care Assessment and Management (repouching, etc.) 0 PROCESS - Coordination of Care X - Simple Patient / Family Education for ongoing care 1 15 []  - Complex (extensive) Patient / Family Education for ongoing care 0 []  - Staff obtains Programmer, systems, Records, Test Results / Process Orders 0 []  - Staff telephones HHA, Nursing Homes / Clarify orders / etc 0 [name], [name] ([id number]) []  - Routine Transfer to another Facility (non-emergent condition) 0 []  - Routine Hospital Admission (non-emergent condition) 0 []  - New Admissions / Biomedical engineer / Ordering NPWT, Apligraf, etc. 0 []  - Emergency Hospital Admission (emergent condition) 0 X - Simple Discharge Coordination 1 10 []  - Complex (extensive) Discharge Coordination 0 PROCESS - Special Needs []  - Pediatric / Minor Patient Management 0 []  - Isolation Patient Management 0 []  - Hearing / Language / Visual special needs 0 []  - Assessment of Community assistance (transportation, D/C planning, etc.) 0 []  - Additional assistance / Altered mentation 0 []  - Support Surface(s) Assessment (bed, cushion, seat, etc.) 0 INTERVENTIONS - Wound Cleansing / Measurement []  - Simple Wound  Cleansing - one wound 0 X - Complex Wound Cleansing - multiple wounds 2 5 X - Wound Imaging (photographs - any number of wounds) 1 5 []  -  Wound Tracing (instead of photographs) 0 []  - Simple Wound Measurement - one wound 0 X - Complex Wound Measurement - multiple wounds 2 5 INTERVENTIONS - Wound Dressings X - Small Wound Dressing one or multiple wounds 2 10 []  - Medium Wound Dressing one or multiple wounds 0 []  - Large Wound Dressing one or multiple wounds 0 []  - Application of Medications - topical 0 []  - Application of Medications - injection 0 [name], [name] [name] ([id number]) INTERVENTIONS - Miscellaneous []  - External ear exam 0 []  - Specimen Collection (cultures, biopsies, blood, body fluids, etc.) 0 []  - Specimen(s) / Culture(s) sent or taken to Lab for analysis 0 []  - Patient Transfer (multiple staff / Harrel Lemon Lift / Similar devices) 0 []  - Simple Staple / Suture removal (25 or less) 0 []  - Complex Staple / Suture removal (26 or more) 0 []  - Hypo / Hyperglycemic Management (close monitor of Blood Glucose) 0 []  - Ankle / Brachial Index (ABI) - do not check if billed separately 0 X - Vital Signs 1 5 Has the patient been seen at the hospital within the last three years: Yes Total Score: 105 Level Of Care: New/Established - Level 3 Electronic Signature(s) Signed: 07/22/2017 4:40:38 PM By: [name] Entered By: [name] on 07/22/2017 14:09:54 [name] ([id number]) -------------------------------------------------------------------------------- Encounter Discharge Information Details [name]. Date of Service: 07/22/2017 1:15 PM Medical Record Patient Account Number: [id number] Number: Treating RN: [name] November 27, 1933 (83 y.o. Other Clinician: Date of Birth/Sex: Female) Treating [name] Primary Care [name]: [name] [name]/Extender: G Referring [name]: [name] in Treatment: 1 Encounter Discharge  Information Items Discharge Pain Level: 0 Discharge Condition: Stable Ambulatory Status: Walker Discharge Destination: Home Transportation: Private Auto Accompanied By: dtr Schedule Follow-up Appointment: Yes Medication Reconciliation completed No and provided to Patient/Care Wood Novacek: Provided on Clinical Summary of Care: 07/22/2017 Form Type Recipient Paper Patient FW Electronic Signature(s) Signed: 07/22/2017 4:20:16 PM By: Ruthine Dose Entered By: Ruthine Dose on 07/22/2017 13:58:16 [name], [name] Singer ([id number]) -------------------------------------------------------------------------------- Lower Extremity Assessment Details [name]. Date of Service: 07/22/2017 1:15 PM Medical Record Patient Account Number: [id number] Number: Treating RN: [name] 1934/04/03 (83 y.o. Other Clinician: Date of Birth/Sex: Female) Treating [name] Primary Care Tyreik Delahoussaye: [name] Jonta Gastineau/Extender: G Referring Alezander Dimaano: [name] Weeks in Treatment: 1 Vascular Assessment Pulses: Dorsalis Pedis Palpable: [Left:Yes] Posterior Tibial Extremity colors, hair growth, and conditions: Extremity Color: [Left:Normal] Hair Growth on Extremity: [Left:Yes] Temperature of Extremity: [Left:Warm] Capillary Refill: [Left:< 3 seconds] Electronic Signature(s) Signed: 07/22/2017 4:40:38 PM By: [name] Entered By: [name] on 07/22/2017 13:33:29 [name] ([id number]) -------------------------------------------------------------------------------- Multi Wound Chart Details [name]. Date of Service: 07/22/2017 1:15 PM Medical Record Patient Account Number: [id number] Number: Treating RN: [name] 02/29/1934 (83 y.o. Other Clinician: Date of Birth/Sex: Female) Treating [name] Primary Care Damier Disano: [name] Brenae Lasecki/Extender: G Referring Nachelle Negrette: [name] Weeks in Treatment:  1 Vital Signs Height(in): 66 Pulse(bpm): 57 Weight(lbs): 218.3 Blood Pressure 121/58 (mmHg): Body Mass Index(BMI): 35 Temperature(F): 99.0 Respiratory Rate 18 (breaths/min): Photos: [4:No Photos] [5:No Photos] [N/A:N/A] Wound Location: [4:Left Knee - Anterior] [5:Left Knee - Posterior] [N/A:N/A] Wounding Event: [4:Gradually Appeared] [5:Gradually Appeared] [N/A:N/A] Primary Etiology: [4:To be determined] [5:To be determined] [N/A:N/A] Comorbid History: [4:Cataracts, Sleep Apnea, Cataracts, Sleep Apnea, N/A Arrhythmia, Hypertension, Arrhythmia, Hypertension, Scleroderma] [5:Scleroderma] Date Acquired: [4:07/07/2017] [5:07/07/2017] [N/A:N/A] Weeks of Treatment: [4:1] [5:1] [N/A:N/A] Wound Status: [4:Open] [5:Open] [N/A:N/A] Measurements  L x W x D 0.9x0.8x0.1 [5:0.2x0.2x1.8] [N/A:N/A] (cm) Area (cm) : [4:0.565] [5:0.031] [N/A:N/A] Volume (cm) : [4:0.057] [5:0.057] [N/A:N/A] % Reduction in Area: [4:91.30%] [5:-29.20%] [N/A:N/A] % Reduction in Volume: 91.20% [5:-16.30%] [N/A:N/A] Classification: [4:Partial Thickness] [5:Partial Thickness] [N/A:N/A] Exudate Amount: [4:Large] [5:Large] [N/A:N/A] Exudate Type: [4:Serosanguineous] [5:Purulent] [N/A:N/A] Exudate Color: [4:red, brown] [5:yellow, brown, green] [N/A:N/A] Wound Margin: [4:Distinct, outline attached Distinct, outline attached N/A] Granulation Amount: [4:Large (67-100%)] [5:Large (67-100%)] [N/A:N/A] Granulation Quality: [4:Red] [5:Red] [N/A:N/A] Necrotic Amount: [4:None Present (0%)] [5:None Present (0%)] [N/A:N/A] Epithelialization: [4:None] [5:None] [N/A:N/A] Periwound Skin Texture: No Abnormalities Noted No Abnormalities Noted N/A Periwound Skin [4:No Abnormalities Noted No Abnormalities Noted N/A] Moisture: [name], [name] ([id number]) Periwound Skin Color: No Abnormalities Noted No Abnormalities Noted N/A Temperature: No Abnormality No Abnormality N/A Tenderness on Yes Yes N/A Palpation: Wound Preparation: Ulcer  Cleansing: Ulcer Cleansing: N/A Rinsed/Irrigated with Rinsed/Irrigated with Saline Saline Topical Anesthetic Topical Anesthetic Applied: None Applied: None Treatment Notes Electronic Signature(s) Signed: 07/22/2017 4:28:59 PM By: Linton Ham MD Entered By: Linton Ham on 07/22/2017 14:02:50 [name] ([id number]) -------------------------------------------------------------------------------- [place] Details [name]. Date of Service: 07/22/2017 1:15 PM Medical Record Patient Account Number: [id number] Number: Treating RN: [name] 22-Sep-1934 (83 y.o. Other Clinician: Date of Birth/Sex: Female) Treating [name] Primary Care Adisa Vigeant: [name] Traylon Schimming/Extender: G Referring Natoria Archibald: [name] Weeks in Treatment: 1 Active Inactive ` Abuse / Safety / Falls / Self Care Management Nursing Diagnoses: Potential for falls Goals: Patient will not experience any injury related to falls Date Initiated: 07/15/2017 Target Resolution Date: 10/31/2017 Goal Status: Active Interventions: Assess Activities of Daily Living upon admission and as needed Assess fall risk on admission and as needed Notes: ` Nutrition Nursing Diagnoses: Imbalanced nutrition Potential for alteratiion in Nutrition/Potential for imbalanced nutrition Goals: Patient/caregiver agrees to and verbalizes understanding of need to use nutritional supplements and/or vitamins as prescribed Date Initiated: 07/15/2017 Target Resolution Date: 10/31/2017 Goal Status: Active Interventions: Assess patient nutrition upon admission and as needed per policy Notes: [name], [name] ([id number]) Orientation to the Wound Care Program Nursing Diagnoses: Knowledge deficit related to the wound healing center program Goals: Patient/caregiver will verbalize understanding of the [place] Program Date Initiated: 07/15/2017 Target Resolution  Date: 08/01/2017 Goal Status: Active Interventions: Provide education on orientation to the wound center Notes: ` Pain, Acute or Chronic Nursing Diagnoses: Pain, acute or chronic: actual or potential Potential alteration in comfort, pain Goals: Patient/caregiver will verbalize adequate pain control between visits Date Initiated: 07/15/2017 Target Resolution Date: 10/31/2017 Goal Status: Active Interventions: Complete pain assessment as per visit requirements Notes: ` Wound/Skin Impairment Nursing Diagnoses: Impaired tissue integrity Knowledge deficit related to ulceration/compromised skin integrity Goals: Ulcer/skin breakdown will have a volume reduction of 80% by week 12 Date Initiated: 07/15/2017 Target Resolution Date: 10/24/2017 Goal Status: Active Interventions: Assess patient/caregiver ability to perform ulcer/skin care regimen upon admission and as needed Assess ulceration(s) every visit [name], [name] ([id number]) Notes: Electronic Signature(s) Signed: 07/22/2017 4:40:38 PM By: [name] Entered By: [name] on 07/22/2017 13:43:06 [name] ([id number]) -------------------------------------------------------------------------------- Pain Assessment Details [name]. Date of Service: 07/22/2017 1:15 PM Medical Record Patient Account Number: [id number] Number: Treating RN: [name] April 18, 1934 (83 y.o. Other Clinician: Date of Birth/Sex: Female) Treating [name] Primary Care Hanaan Gancarz: [name] Brinda Focht/Extender: G Referring Joyia Riehle: [name] Weeks in Treatment: 1 Active Problems Location of Pain Severity and Description of Pain Patient Has Paino No Site  Locations Pain Management and Medication Current Pain Management: Notes Topical or injectable lidocaine is offered to patient for acute pain when surgical debridement is performed. If needed, Patient is instructed to use over the counter pain  medication for the following 24-48 hours after debridement. Wound care MDs do not prescribed pain medications. Patient has chronic pain or uncontrolled pain. Patient has been instructed to make an appointment with their Primary Care Physician for pain management. Electronic Signature(s) Signed: 07/22/2017 4:40:38 PM By: [name] Entered By: [name] on 07/22/2017 13:24:15 [name] ([id number]) -------------------------------------------------------------------------------- Patient/Caregiver Education Details [name]. Date of Service: 07/22/2017 1:15 PM Medical Record Patient Account Number: [id number] Number: Treating RN: [name] Feb 27, 1934 (83 y.o. Other Clinician: Date of Birth/Gender: Female) Treating [name] Primary Care Physician: [name] Physician/Extender: G Referring Physician: Ayesha [name] in Treatment: 1 Education Assessment Education Provided To: Patient and Caregiver Education Topics Provided Wound/Skin Impairment: Handouts: Other: wound care as ordered Methods: Demonstration, Explain/Verbal Responses: State content correctly Electronic Signature(s) Signed: 07/22/2017 4:40:38 PM By: [name] Entered By: [name] on 07/22/2017 13:45:12 [name], [name] Singer ([id number]) -------------------------------------------------------------------------------- Wound Assessment Details [name]. Date of Service: 07/22/2017 1:15 PM Medical Record Patient Account Number: [id number] Number: Treating RN: [name] 1933/12/18 (83 y.o. Other Clinician: Date of Birth/Sex: Female) Treating [name] Primary Care Tailynn Armetta: [name] Britani Beattie/Extender: G Referring Demetry Bendickson: [name] Weeks in Treatment: 1 Wound Status Wound Number: 4 Primary To be determined Etiology: Wound Location: Left Knee - Anterior Wound Open Wounding Event: Gradually  Appeared Status: Date Acquired: 07/07/2017 Comorbid Cataracts, Sleep Apnea, Arrhythmia, Weeks Of Treatment: 1 History: Hypertension, Scleroderma Clustered Wound: No Photos Photo Uploaded By: [name] on 07/22/2017 14:13:23 Wound Measurements Length: (cm) 0.9 Width: (cm) 0.8 Depth: (cm) 0.1 Area: (cm) 0.565 Volume: (cm) 0.057 % Reduction in Area: 91.3% % Reduction in Volume: 91.2% Epithelialization: None Tunneling: No Undermining: No Wound Description Classification: Partial Thickness Foul Odor Aft Wound Margin: Distinct, outline attached Slough/Fibrin Exudate Amount: Large Exudate Type: Serosanguineous Exudate Color: red, brown er Cleansing: No o No Wound Bed Granulation Amount: Large (67-100%) Granulation Quality: Red Necrotic Amount: None Present (0%) [place], [name] Singer ([id number]) Periwound Skin Texture Texture Color No Abnormalities Noted: No No Abnormalities Noted: No Moisture Temperature / Pain No Abnormalities Noted: No Temperature: No Abnormality Tenderness on Palpation: Yes Wound Preparation Ulcer Cleansing: Rinsed/Irrigated with Saline Topical Anesthetic Applied: None Treatment Notes Wound #4 (Left, Anterior Knee) 1. Cleansed with: Clean wound with Normal Saline 4. Dressing Applied: Hydrogel Prisma Ag 5. Secondary Dressing Applied Bordered Foam Dressing Dry Gauze Electronic Signature(s) Signed: 07/22/2017 4:40:38 PM By: [name] Entered By: [name] on 07/22/2017 13:32:49 [name] ([id number]) -------------------------------------------------------------------------------- Wound Assessment Details [name]. Date of Service: 07/22/2017 1:15 PM Medical Record Patient Account Number: [id number] Number: Treating RN: [name] [birth year] (83 y.o. Other Clinician: Date of Birth/Sex: Female) Treating [name] Primary Care Meris Reede: [name] Sarabelle Genson/Extender: G Referring  Kismet Facemire: [name] Weeks in Treatment: 1 Wound Status Wound Number: 5 Primary To be determined Etiology: Wound Location: Left Knee - Posterior Wound Open Wounding Event: Gradually Appeared Status: Date Acquired: 07/07/2017 Comorbid Cataracts, Sleep Apnea, Arrhythmia, Weeks Of Treatment: 1 History: Hypertension, Scleroderma Clustered Wound: No Photos Photo Uploaded By: [name] on 07/22/2017 14:15:43 Wound Measurements Length: (cm) 0.2 Width: (cm) 0.2 Depth: (cm) 1.8 Area: (cm) 0.031 Volume: (cm) 0.057 % Reduction in Area: -29.2% % Reduction in  Volume: -16.3% Epithelialization: None Tunneling: No Undermining: No Wound Description Classification: Partial Thickness Foul Odor Aft Wound Margin: Distinct, outline attached Slough/Fibrin Exudate Amount: Large Exudate Type: Purulent Exudate Color: yellow, brown, green er Cleansing: No o Yes Wound Bed Granulation Amount: Large (67-100%) Granulation Quality: Red Necrotic Amount: None Present (0%) [name], [name] Singer ([id number]) Periwound Skin Texture Texture Color No Abnormalities Noted: No No Abnormalities Noted: No Moisture Temperature / Pain No Abnormalities Noted: No Temperature: No Abnormality Tenderness on Palpation: Yes Wound Preparation Ulcer Cleansing: Rinsed/Irrigated with Saline Topical Anesthetic Applied: None Treatment Notes Wound #5 (Left, Posterior Knee) 1. Cleansed with: Clean wound with Normal Saline 4. Dressing Applied: Iodoform packing Gauze 5. Secondary Dressing Applied Bordered Foam Dressing Dry Gauze Electronic Signature(s) Signed: 07/22/2017 4:40:38 PM By: [name] Entered By: [name] on 07/22/2017 13:33:05 [name] ([id number]) -------------------------------------------------------------------------------- [place] Details [name]. Date of Service: 07/22/2017 1:15 PM Medical Record Patient Account Number:  [id number] Number: Treating RN: [name] 20-Nov-1933 (83 y.o. Other Clinician: Date of Birth/Sex: Female) Treating [name] Primary Care Talajah Slimp: [name] Shadasia Oldfield/Extender: G Referring Griselle Rufer: [name] Weeks in Treatment: 1 Vital Signs Time Taken: 13:24 Temperature (F): 99.0 Height (in): 66 Pulse (bpm): 57 Weight (lbs): 218.3 Respiratory Rate (breaths/min): 18 Body Mass Index (BMI): 35.2 Blood Pressure (mmHg): 121/58 Reference Range: 80 - 120 mg / dl Electronic Signature(s) Signed: 07/22/2017 4:40:38 PM By: [name] Entered By: [name] on 07/22/2017 13:25:42

## 2017-07-30 ENCOUNTER — Other Ambulatory Visit: Payer: Self-pay | Admitting: Gastroenterology

## 2017-08-05 ENCOUNTER — Encounter: Payer: Medicare Other | Attending: Internal Medicine | Admitting: Internal Medicine

## 2017-08-05 DIAGNOSIS — L02416 Cutaneous abscess of left lower limb: Secondary | ICD-10-CM | POA: Insufficient documentation

## 2017-08-05 DIAGNOSIS — M71062 Abscess of bursa, left knee: Secondary | ICD-10-CM | POA: Insufficient documentation

## 2017-08-05 DIAGNOSIS — B9561 Methicillin susceptible Staphylococcus aureus infection as the cause of diseases classified elsewhere: Secondary | ICD-10-CM | POA: Diagnosis not present

## 2017-08-05 DIAGNOSIS — G4730 Sleep apnea, unspecified: Secondary | ICD-10-CM | POA: Diagnosis not present

## 2017-08-05 DIAGNOSIS — Z882 Allergy status to sulfonamides status: Secondary | ICD-10-CM | POA: Insufficient documentation

## 2017-08-05 DIAGNOSIS — Z9981 Dependence on supplemental oxygen: Secondary | ICD-10-CM | POA: Insufficient documentation

## 2017-08-05 DIAGNOSIS — L97928 Non-pressure chronic ulcer of unspecified part of left lower leg with other specified severity: Secondary | ICD-10-CM | POA: Insufficient documentation

## 2017-08-05 DIAGNOSIS — I10 Essential (primary) hypertension: Secondary | ICD-10-CM | POA: Insufficient documentation

## 2017-08-05 DIAGNOSIS — M349 Systemic sclerosis, unspecified: Secondary | ICD-10-CM | POA: Diagnosis not present

## 2017-08-05 DIAGNOSIS — Z96652 Presence of left artificial knee joint: Secondary | ICD-10-CM | POA: Diagnosis not present

## 2017-08-06 NOTE — Progress Notes (Signed)
ALIVEA, GLADSON ([id number]) Visit Report for 08/05/2017 HPI Details [name], [name]. Date of Service: 08/05/2017 12:30 PM Medical Record Patient Account Number: [id number] Number: Treating RN: Montey Hora Mar 05, 1934 (83 y.o. Other Clinician: Date of Birth/Sex: Female) Treating Talayah Picardi Primary Care Provider: Loura Pardon Provider/Extender: G Referring Provider: Loura Pardon Weeks in Treatment: 3 History of Present Illness HPI Description: 10/28/16; this is an 83 year old woman who arrives with a complicated medical issue. 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 Initially in 2000 and apparently most recently in 2014. 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 Indeed she was hospitalized on 02/13/16 and discharged on 02/19/16. She was taken to the OR and it was found that the popliteal abscess was communicating with her joint space. Signed to feel fluid grew MSSA. Patient was treated with IandD of the abscess. She completed IV cefazolin on 03/28/16 and since then has been on Keflex and rifampin for chronic suppression. Her infectious disease doctor is Dr.Shrestha at [facility]. The patient was seen in our [place] sister clinic through much of October and November 2017. At that point she had a draining sinus that eventually closed over. She noted some pain behind her knee surrounding Christmas day and was seen in our emergency room on 10/23/16. During this ER visit she underwent tendon incision and drainage of a left Baker's cyst and clear colored synovial fluid was obtained. Doxycycline was added for a week to her Keflex and rifampin. A CT scan was ordered but I don't think was ever done due to artifacts from hardware. Her wound culture was negative. She has been using iodoform packing. She states her knee is painful when she stands on it for a period of time. She is not systemically unwell. She has a  history of scleroderma or without is been under control for some period of time. 11/04/16 the culture that I did of this area last week showed coag-negative staph and a few enterococcus faecium. Enterococcus is ampicillin resistant. The coag negative staph is only sensitive to vancomycin and tetracycline. Previous joint infection was methicillin sensitive staph aureus. It seems I misunderstood what she said and that her infectious disease clinic appointment is this Friday and the orthopedic appointment was last Friday. I faxed the culture results to the infectious disease clinic but I've given him a copy of it today. Per the patient and her family they orthopedic surgeon did not want to do anything further to this joint. The patient is not systemically unwell in particular no fever or chills and the pain she has is really minimal 11/18/16; this is a patient who is 83 who has an infected left knee prosthetic joint. She went to see infectious disease at [facility] who apparently did did not recommend any further antibiotic therapy. She continues to feel the same episodic pain but no systemic symptoms of particular no fever. The depth of the wound in the popliteal fossa of her left knee is 3.5 versus 5 cm last week 11/25/16 4.5 cm of depth today. Draining exit site. We have treating this with iodoform packing however I really have no expectation of healing and to be truthful the amount of drainage that is coming out of this knee I don't think closure of this site is necessarily considered a positive outcome. Her left knee remains warm but not painful there is an effusion she has no systemic symptoms no fever no chills or and she feels well [name], [name] M. (  [id number]) 12/31/16. Still is same amount of depth today using a skinny at 4.6 cm. There is less drainage. We have been using iodoform packing and I really have no expectation of healing and to be truthful closing this sinus probably is really not the best thing to  consider. 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 She is not felt to be a candidate for a fourth replacement. In the meantime she does not complain of pain except when the dressing is being changed episodically. She has not been systemically unwell no fever or chills or other issues. 01/14/17; after the patient left on 3/7 her daughter tells me the next day the wound had closed over and therefore this is not intact since. Over the last 3 days the patient has noted significant pain in the popliteal fossa of the involved left knee. 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 MSSA was the cause of the original identified joint infection treated at [facility]. 01/21/17; I saw this patient last week at which time there was intense erythema in the popliteal fossa, tenderness and a ballotable fluid collection. I did an IandD on this. Surprisingly the culture of this was negative although as a result of the IandD her tenderness and erythema in the area is resolved. We continue with the iodoform packing READMISSION 04/07/17; this is a patient I know from 2 prior admissions to this clinic starting in January of this year. Her history is essentially as noted above. 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 She has a draining sinus into the popliteal fossa of the left knee. This remains open and drains. She has been using iodoform packing to this area with gauze over the top. To be truthful I haven't really been certain whether having this area close would be helpful or harmful to this patient. Previously when it's closed she develops a lot more pain in the back of the popliteal fossa and more pain in the knee. She is here today because the packing of the area has become increasingly painful in fact the patient describes this as a  "torture chamber" experience. They are packing this wound twice a day. The patient follows with orthopedic surgery and infectious disease at [facility] although she hasn't seen them since she was last here in March. She denies any systemic symptoms including fever chills etc. She states except for the changing of the dressing her pain when she is mobilizing on the knee is 2/10. She also has a area on the left anterior patella in the incision that is excoriated open. The patient states this is itchy and she does a lot of scratching. They state this was here last time she was here but I have no recollection or documentation of this 04/21/17; patient was readmitted to clinic 2 weeks ago. She has a draining sinus in the left popliteal fossa and a chronically infected left total knee replacement. She also had an excoriation on the left anterior knee. She arrives in clinic today with all of this looking considerably better. The divot in the popliteal fossa is actually closed. READMISSION 07/15/17; the patient arrived today with a real opening of the draining sinus in her left popliteal fossa. This opened about a week ago after developing a painful "knot". Is been draining clear fluid. The patient's pain is a lot better. She is not  been systemically unwell. oShe also is been dealing with an area on the left anterior leg just below the tibial plateau. She also had this last time. Her daughter states that she scratches this area continuously. I have reviewed the last infectious disease note from August. 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 Much to my chagrin they seem reluctant to tap the joint. A culture of purulent material that came out of the popliteal fossa earlier this year cultured coag-negative staph aureus [not an uncommon cause of prosthetic joint infection]. However they really ignored this and they state that  the source was questionable. If they believe this I am wondering why they're so reluctant to tap the fluid. She has not seen orthopedics [name], [name]. ([id number]) 07/22/17; patient's sinus tract in the popliteal fossa about 2.5 cm. The area on the anterior part of her knee looks a lot better. We have been using iodoform packing to the popliteal fossa, collagen to the anterior part of her knee. She is not experiencing undue pain or systemic symptoms 08/05/17; patient has a sinus tract in the popliteal fossa on the left. 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 She does not have any options for further replacements she is only been offered an above-knee amputation. 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 She has not been systemically unwell no fever. There is some pain and swelling in the knee but all in all this is not unbearable. They've been using iodoform packing Electronic Signature(s) Signed: 08/05/2017 4:12:03 PM By: Linton Ham MD Entered By: Linton Ham on 08/05/2017 13:13:30 [name] ([id number]) -------------------------------------------------------------------------------- Physical Exam Details [name]. Date of Service: 08/05/2017 12:30 PM Medical Record Patient Account Number: [id number] Number: Treating RN: Montey Hora [birth year] (83 y.o. Other Clinician: Date of Birth/Sex: Female) Treating Ammi Hutt Primary Care Provider: Loura Pardon Provider/Extender: G Referring Provider: Loura Pardon Weeks in Treatment: 3 Constitutional Sitting or standing Blood Pressure is within target range for patient.. Pulse regular and within target range for patient.Marland Kitchen Respirations regular, non-labored and within target range.. Temperature is normal and within the target range for the patient.Marland Kitchen appears in no distress. Eyes Conjunctivae clear. No discharge. Respiratory Respiratory  effort is easy and symmetric bilaterally. Rate is normal at rest and on room air.Marland Kitchen Lymphatic None palpable in the left popliteal or inguinal area. Musculoskeletal Left knee is swollen with an effusion. There is palpable warmth but no erythema.Marland Kitchen Psychiatric No evidence of depression, anxiety, or agitation. Calm, cooperative, and communicative. Appropriate interactions and affect.. Notes Wound exam; the wound on the anterior left knee is epithelialized. She has some small irritated ear areas medially on the knee which may be from dressing rather than infection. Depth of this is now down to 1.1 which is an improvement. There is some tenderness around the orifice but no erythema. No purulent drainage Electronic Signature(s) Signed: 08/05/2017 4:12:03 PM By: Linton Ham MD Entered By: Linton Ham on 08/05/2017 13:15:34 [name] ([id number]) -------------------------------------------------------------------------------- Physician Orders Details [name]. Date of Service: 08/05/2017 12:30 PM Medical Record Patient Account Number: [id number] Number: Treating RN: Montey Hora [birth year] (83 y.o. Other Clinician: Date of Birth/Sex: Female) Treating Qasim Diveley Primary Care Provider: Loura Pardon Provider/Extender: G Referring Provider: Loura Pardon Weeks in Treatment: 3 Verbal / Phone Orders: No Diagnosis Coding Wound Cleansing Wound #5  Left,Posterior Knee o Clean wound with Normal Saline. o Cleanse wound with mild soap and water o May Shower, gently pat wound dry prior to applying new dressing. Anesthetic Wound #5 Left,Posterior Knee o Topical Lidocaine 4% cream applied to wound bed prior to debridement Skin Barriers/Peri-Wound Care Wound #5 Left,Posterior Knee o Skin Prep Primary Wound Dressing Wound #5 Left,Posterior Knee o Iodoform packing Gauze Secondary Dressing Wound #5 Left,Posterior Knee o Dry Gauze o  Boardered Foam Dressing o Other - coban to secure Dressing Change Frequency Wound #5 Left,Posterior Knee o Change dressing every day. Follow-up Appointments Wound #5 Left,Posterior Knee o Return Appointment in 1 month Edema Control [name], CRISTINA. ([id number]) Wound #5 Left,Posterior Knee o Elevate legs to the level of the heart and pump ankles as often as possible Additional Orders / Instructions Wound #5 Left,Posterior Knee o Increase protein intake. Electronic Signature(s) Signed: 08/05/2017 4:12:03 PM By: Linton Ham MD Signed: 08/05/2017 4:35:12 PM By: Montey Hora Entered By: Montey Hora on 08/05/2017 12:57:46 [name] ([id number]) -------------------------------------------------------------------------------- Problem List Details [name], HARR. Date of Service: 08/05/2017 12:30 PM Medical Record Patient Account Number: [id number] Number: Treating RN: Montey Hora 01/29/1934 (83 y.o. 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 Date of  Service: 08/05/2017 12:30 PM Medical Record Patient Account Number: [id number] Number: Treating RN: Montey Hora 1933/12/11 (83 y.o. Other Clinician: Date of Birth/Sex: Female) Treating Jamell Laymon Primary Care Provider: Loura Pardon Provider/Extender: G Referring Provider: Loura Pardon Weeks in Treatment: 3 Subjective History of Present Illness (HPI) 10/28/16; this is an 83 year old woman who arrives with a complicated medical issue. 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 Initially in 2000 and apparently most recently in 2014. 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 Indeed she was hospitalized on 02/13/16 and discharged on 02/19/16. She was taken to the OR and it was found that the popliteal abscess was communicating with her joint space. Signed to feel fluid grew MSSA. Patient was treated with IandD of the abscess. She completed IV cefazolin on 03/28/16 and since then has been on Keflex and rifampin for chronic suppression. Her infectious disease doctor is Dr.Shrestha at [facility]. The patient was seen in our [place] sister clinic through much of October and November 2017. At that point she had a draining sinus that eventually closed over. She noted some pain behind her knee surrounding Christmas day and was seen in our emergency room on 10/23/16. During this ER visit she underwent tendon incision and drainage of a left Baker's cyst and clear colored synovial fluid was obtained. Doxycycline was added for a week to her Keflex and rifampin. A CT scan was ordered but I don't think was ever done due to artifacts from hardware. Her wound culture was negative. She has been using iodoform packing. She states her  knee is painful when she stands on it for a period of time. She is not systemically unwell. She has a history of scleroderma or without is been under control for some period of time. 11/04/16 the culture that I did of  this area last week showed coag-negative staph and a few enterococcus faecium. Enterococcus is ampicillin resistant. The coag negative staph is only sensitive to vancomycin and tetracycline. Previous joint infection was methicillin sensitive staph aureus. It seems I misunderstood what she said and that her infectious disease clinic appointment is this Friday and the orthopedic appointment was last Friday. I faxed the culture results to the infectious disease clinic but I've given him a copy of it today. Per the patient and her family they orthopedic surgeon did not want to do anything further to this joint. The patient is not systemically unwell in particular no fever or chills and the pain she has is really minimal 11/18/16; this is a patient who is 83 who has an infected left knee prosthetic joint. She went to see infectious disease at [facility] who apparently did did not recommend any further antibiotic therapy. She continues to feel the same episodic pain but no systemic symptoms of particular no fever. The depth of the wound in the popliteal fossa of her left knee is 3.5 versus 5 cm last week 11/25/16 4.5 cm of depth today. Draining exit site. We have treating this with iodoform packing however I really have no expectation of healing and to be truthful the amount of drainage that is coming out of this knee I don't think closure of this site is necessarily considered a positive outcome. Her left knee remains warm but not painful there is an effusion she has no systemic symptoms no fever no chills or and she feels well 12/31/16. Still is same amount of depth today using a skinny at 4.6 cm. There is less drainage. We have been using iodoform packing and I really have no expectation of healing and to be truthful closing this sinus [name], [name]. ([id number]) probably is really not the best thing to consider. 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 She is not felt to be a candidate for a  fourth replacement. In the meantime she does not complain of pain except when the dressing is being changed episodically. She has not been systemically unwell no fever or chills or other issues. 01/14/17; after the patient left on 3/7 her daughter tells me the next day the wound had closed over and therefore this is not intact since. Over the last 3 days the patient has noted significant pain in the popliteal fossa of the involved left knee. 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 MSSA was the cause of the original identified joint infection treated at [facility]. 01/21/17; I saw this patient last week at which time there was intense erythema in the popliteal fossa, tenderness and a ballotable fluid collection. I did an IandD on this. Surprisingly the culture of this was negative although as a result of the IandD her tenderness and erythema in the area is resolved. We continue with the iodoform packing READMISSION 04/07/17; this is a patient I know from 2 prior admissions to this clinic starting in January of this year. Her history is essentially as noted above. 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 She has a draining sinus into  the popliteal fossa of the left knee. This remains open and drains. She has been using iodoform packing to this area with gauze over the top. To be truthful I haven't really been certain whether having this area close would be helpful or harmful to this patient. Previously when it's closed she develops a lot more pain in the back of the popliteal fossa and more pain in the knee. She is here today because the packing of the area has become increasingly painful in fact the patient describes this as a "torture chamber" experience. They are packing this wound twice a day. The patient follows with orthopedic surgery and  infectious disease at [facility] although she hasn't seen them since she was last here in March. She denies any systemic symptoms including fever chills etc. She states except for the changing of the dressing her pain when she is mobilizing on the knee is 2/10. She also has a area on the left anterior patella in the incision that is excoriated open. The patient states this is itchy and she does a lot of scratching. They state this was here last time she was here but I have no recollection or documentation of this 04/21/17; patient was readmitted to clinic 2 weeks ago. She has a draining sinus in the left popliteal fossa and a chronically infected left total knee replacement. She also had an excoriation on the left anterior knee. She arrives in clinic today with all of this looking considerably better. The divot in the popliteal fossa is actually closed. READMISSION 07/15/17; the patient arrived today with a real opening of the draining sinus in her left popliteal fossa. This opened about a week ago after developing a painful "knot". Is been draining clear fluid. The patient's pain is a lot better. She is not been systemically unwell. She also is been dealing with an area on the left anterior leg just below the tibial plateau. She also had this last time. Her daughter states that she scratches this area continuously. I have reviewed the last infectious disease note from August. 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 Much to my chagrin they seem reluctant to tap the joint. A culture of purulent material that came out of the popliteal fossa earlier this year cultured coag-negative staph aureus [not an uncommon cause of prosthetic joint infection]. However they really ignored this and they state that the source was questionable. If they believe this I am wondering why they're so reluctant to tap the fluid. She has not  seen orthopedics 07/22/17; patient's sinus tract in the popliteal fossa about 2.5 cm. The area on the anterior part of her knee looks a lot better. We have been using iodoform packing to the popliteal fossa, collagen to the anterior part [name], [name]. ([id number]) of her knee. She is not experiencing undue pain or systemic symptoms 08/05/17; patient has a sinus tract in the popliteal fossa on the left. 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 She does not have any options for further replacements she is only been offered an above-knee amputation. 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 She has not been systemically unwell no fever. There is some pain and swelling in the knee but all in all this is not unbearable. They've been using iodoform packing Objective Constitutional Sitting or standing Blood Pressure is within target range for patient.. Pulse regular  and within target range for patient.Marland Kitchen Respirations regular, non-labored and within target range.. Temperature is normal and within the target range for the patient.Marland Kitchen appears in no distress. Vitals Time Taken: 12:42 PM, Height: 66 in, Weight: 218.3 lbs, BMI: 35.2, Temperature: 98.3 F, Pulse: 69 bpm, Respiratory Rate: 18 breaths/min, Blood Pressure: 134/54 mmHg. Eyes Conjunctivae clear. No discharge. Respiratory Respiratory effort is easy and symmetric bilaterally. Rate is normal at rest and on room air.Marland Kitchen Lymphatic None palpable in the left popliteal or inguinal area. Musculoskeletal Left knee is swollen with an effusion. There is palpable warmth but no erythema.Marland Kitchen Psychiatric No evidence of depression, anxiety, or agitation. Calm, cooperative, and communicative. Appropriate interactions and affect.. General Notes: Wound exam; the wound on the anterior left knee is epithelialized. She has some small irritated ear areas medially on the knee which may be from dressing rather than  infection. Depth of this is now down to 1.1 which is an improvement. There is some tenderness around the orifice but no erythema. No purulent drainage Integumentary (Hair, Skin) Wound #4 status is Healed - Epithelialized. Original cause of wound was Gradually Appeared. The wound [name], [name]. ([id number]) is located on the Left,Anterior Knee. The wound measures 0cm length x 0cm width x 0cm depth; 0cm^2 area and 0cm^3 volume. Wound #5 status is Open. Original cause of wound was Gradually Appeared. The wound is located on the Left,Posterior Knee. The wound measures 0.2cm length x 0.2cm width x 1.1cm depth; 0.031cm^2 area and 0.035cm^3 volume. There is no tunneling or undermining noted. There is a large amount of purulent drainage noted. The wound margin is distinct with the outline attached to the wound base. There is large (67-100%) red granulation within the wound bed. There is no necrotic tissue within the wound bed. Periwound temperature was noted as No Abnormality. The periwound has tenderness on palpation. 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 Cleanse wound with mild soap and water May Shower, gently pat wound dry prior to applying new dressing. Anesthetic: Wound #5 Left,Posterior Knee: Topical Lidocaine 4% cream applied to wound bed prior to debridement Skin Barriers/Peri-Wound Care: Wound #5 Left,Posterior Knee: Skin Prep Primary Wound Dressing: Wound #5 Left,Posterior Knee: Iodoform packing Gauze Secondary Dressing: Wound #5 Left,Posterior Knee: Dry Gauze Boardered Foam Dressing Other - coban  to secure [name], [name] ([id number]) Dressing Change Frequency: Wound #5 Left,Posterior Knee: Change dressing every day. Follow-up Appointments: Wound #5 Left,Posterior Knee: Return Appointment in 1 month Edema Control: Wound #5 Left,Posterior Knee: Elevate legs to the level of the heart and pump ankles as often as possible Additional Orders / Instructions: Wound #5 Left,Posterior Knee: Increase protein intake. #1 I don't see any reason to change from the current dressing which is iodoform packing. #2 there seems to be slightly more in terms of the synovial infusion on the left knee. I told them I think this probably should be tapped although I've told her this before. She does not have any follow-up with orthopedics or infectious disease at [facility]3 follow-up in one month or when necessary when need arises Electronic Signature(s) Signed: 08/05/2017 4:12:03 PM By: Linton Ham MD Entered By: Linton Ham on 08/05/2017 13:16:51 [name], [name] Singer. (  [id number]) -------------------------------------------------------------------------------- SuperBill Details [name], [name]. Date of Service: 08/05/2017 Medical Record Patient Account Number: [id number] Number: Treating RN: Montey Hora 26-Oct-1934 (83 y.o. 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

## 2017-08-07 ENCOUNTER — Encounter: Payer: Self-pay | Admitting: Pulmonary Disease

## 2017-08-07 ENCOUNTER — Ambulatory Visit (INDEPENDENT_AMBULATORY_CARE_PROVIDER_SITE_OTHER): Payer: Medicare Other | Admitting: Pulmonary Disease

## 2017-08-07 DIAGNOSIS — G4733 Obstructive sleep apnea (adult) (pediatric): Secondary | ICD-10-CM | POA: Diagnosis not present

## 2017-08-07 DIAGNOSIS — J9611 Chronic respiratory failure with hypoxia: Secondary | ICD-10-CM

## 2017-08-07 DIAGNOSIS — I272 Pulmonary hypertension, unspecified: Secondary | ICD-10-CM | POA: Diagnosis not present

## 2017-08-07 NOTE — Patient Instructions (Signed)
Get back with Dr. Rayann Heman to assess  atrial fibrillation You may need echocardiogram to assess pulmonary artery pressure  Continue on oxygen during sleep

## 2017-08-07 NOTE — Assessment & Plan Note (Signed)
Was unable to tolerate CPAP 

## 2017-08-07 NOTE — Assessment & Plan Note (Addendum)
Get back with Dr. Rayann Heman to assess  atrial fibrillation - may need echocardiogram to assess pulmonary artery pressure, echo showed high pressures but right heart Cath showed normal pressures

## 2017-08-07 NOTE — Progress Notes (Signed)
   Subjective:    Patient ID: [name], female    DOB: 11-20-1933, 83 y.o.   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  Off Xarelto Dec 2015.  She has hx of Atrial Fib and PSVT s/pablation in 02/2015  She was unable to use the CPAP and turned it and has been maintained on nocturnal oxygen since 2017. She is compliant with this and this is helping her memory and concentration issues. Daughter reports at least 2 occasions when she felt she was not breathing and woke her up and checked her oxygen level and this was low  She is on long-term antibiotics-Keflex  for left infected knee prosthesis  She complained of a dry cough on her last visit, treated with an antihistaminic such as decongestant combination and this seems to have resolved completely  There is mention of scleroderma on her past medical history and problem list but patient or daughter does not have any knowledge of this  She did not desaturate on ambulation today  Significant tests/ events  RHC (06/2014)  RA mean 6  PA 43/12, mean 25  PCWP mean 16  PVR 1.6 WU  CI 2.9   PFTs (05/25/14) FEV1 1.88 L (88%) FVC 2.22 L (77%) ratio 114, DLCO 68%   HRCT chest 01/2015 mild bronchiectasis right lower lobe VQ 05/30/14: Low prob   PSG 06/2014 >> severe OSA, AHI 62 per hour, corrected by CPAP 12 cm with small fullface mask. Central apneas emergent higher pressures  ONO on CPPA/RA shows 71 min desatn -Add 2L O2 to CPAP ONO on CPAP/2 L O2 >>no desatn  Echo 02/12/15>> EF 55%, mild MR, moderate TR, PA pressure 57, RV severely dilated, small pericardial effusion, LA 37.   Review of Systems Patient denies significant dyspnea,cough, hemoptysis,  chest pain, palpitations, pedal edema,  orthopnea, paroxysmal nocturnal dyspnea, lightheadedness, nausea, vomiting, abdominal or  leg pains      Objective:   Physical Exam   Gen. Pleasant, well-nourished, in no distress ENT - no thrush, no post nasal drip Neck: No JVD, no thyromegaly, no carotid bruits Lungs: no use of accessory muscles, no dullness to percussion, clear without rales or rhonchi  Cardiovascular: Rhythm regular, heart sounds  normal, no murmurs or gallops, no peripheral edema Musculoskeletal: No deformities, no cyanosis or clubbing , Ambulates with walker        Assessment & Plan:

## 2017-08-07 NOTE — Assessment & Plan Note (Signed)
Continue 2 L of oxygen during sleep 

## 2017-08-08 NOTE — Progress Notes (Signed)
[name], [name] ([id number]) Visit Report for 08/05/2017 Arrival Information Details [name]. Date of Service: 08/05/2017 12:30 PM Medical Record Patient Account Number: [id number] Number: Treating RN: Montey Hora 10-19-1934 (83 y.o. Other Clinician: Date of Birth/Sex: Female) Treating ROBSON, MICHAEL Primary Care Murad Staples: Loura Pardon Denece Shearer/Extender: G Referring Zakia Sainato: Ayesha Mohair in Treatment: 3 Visit Information History Since Last Visit Added or deleted any medications: No Patient Arrived: Walker Any new allergies or adverse reactions: No Arrival Time: 12:39 Had a fall or experienced change in No Accompanied By: dtr activities of daily living that may affect Transfer Assistance: None risk of falls: Patient Identification Verified: Yes Signs or symptoms of abuse/neglect since last No Secondary Verification Process Completed: Yes visito Patient Requires Transmission-Based No Hospitalized since last visit: No Precautions: Has Dressing in Place as Prescribed: Yes Patient Has Alerts: No Pain Present Now: No Electronic Signature(s) Signed: 08/05/2017 4:35:12 PM By: Montey Hora Entered By: Montey Hora on 08/05/2017 12:41:56 [name] ([id number]) -------------------------------------------------------------------------------- Clinic Level of Care Assessment Details [name]. Date of Service: 08/05/2017 12:30 PM Medical Record Patient Account Number: [id number] Number: Treating RN: Montey Hora 1934/05/22 (83 y.o. Other Clinician: Date of Birth/Sex: Female) Treating ROBSON, [place] Primary Care Mical Brun: Loura Pardon Eldine Rencher/Extender: G Referring [name]: Loura Pardon Weeks in Treatment: 3 Clinic Level of Care Assessment Items TOOL 4 Quantity Score []  - Use when only an EandM is performed on FOLLOW-UP visit 0 ASSESSMENTS - Nursing Assessment / Reassessment X - Reassessment of  Co-morbidities (includes updates in patient status) 1 10 X - Reassessment of Adherence to Treatment Plan 1 5 ASSESSMENTS - Wound and Skin Assessment / Reassessment []  - Simple Wound Assessment / Reassessment - one wound 0 X - Complex Wound Assessment / Reassessment - multiple wounds 2 5 []  - Dermatologic / Skin Assessment (not related to wound area) 0 ASSESSMENTS - Focused Assessment []  - Circumferential Edema Measurements - multi extremities 0 []  - Nutritional Assessment / Counseling / Intervention 0 X - Lower Extremity Assessment (monofilament, tuning fork, pulses) 1 5 []  - Peripheral Arterial Disease Assessment (using hand held doppler) 0 ASSESSMENTS - Ostomy and/or Continence Assessment and Care []  - Incontinence Assessment and Management 0 []  - Ostomy Care Assessment and Management (repouching, etc.) 0 PROCESS - Coordination of Care X - Simple Patient / Family Education for ongoing care 1 15 []  - Complex (extensive) Patient / Family Education for ongoing care 0 []  - Staff obtains Programmer, systems, Records, Test Results / Process Orders 0 []  - Staff telephones HHA, Nursing Homes / Clarify orders / etc 0 [name], PICKING ([id number]) []  - Routine Transfer to another Facility (non-emergent condition) 0 []  - Routine Hospital Admission (non-emergent condition) 0 []  - New Admissions / Biomedical engineer / Ordering NPWT, Apligraf, etc. 0 []  - Emergency Hospital Admission (emergent condition) 0 X - Simple Discharge Coordination 1 10 []  - Complex (extensive) Discharge Coordination 0 PROCESS - Special Needs []  - Pediatric / Minor Patient Management 0 []  - Isolation Patient Management 0 []  - Hearing / Language / Visual special needs 0 []  - Assessment of Community assistance (transportation, D/C planning, etc.) 0 []  - Additional assistance / Altered mentation 0 []  - Support Surface(s) Assessment (bed, cushion, seat, etc.) 0 INTERVENTIONS - Wound Cleansing / Measurement []  - Simple Wound  Cleansing - one wound 0 X - Complex Wound Cleansing - multiple wounds 2 5 X - Wound Imaging (photographs - any number of wounds) 1 5 []  -  Wound Tracing (instead of photographs) 0 []  - Simple Wound Measurement - one wound 0 X - Complex Wound Measurement - multiple wounds 2 5 INTERVENTIONS - Wound Dressings X - Small Wound Dressing one or multiple wounds 1 10 []  - Medium Wound Dressing one or multiple wounds 0 []  - Large Wound Dressing one or multiple wounds 0 []  - Application of Medications - topical 0 []  - Application of Medications - injection 0 [name], [name] ([id number]) INTERVENTIONS - Miscellaneous []  - External ear exam 0 []  - Specimen Collection (cultures, biopsies, blood, body fluids, etc.) 0 []  - Specimen(s) / Culture(s) sent or taken to Lab for analysis 0 []  - Patient Transfer (multiple staff / Harrel Lemon Lift / Similar devices) 0 []  - Simple Staple / Suture removal (25 or less) 0 []  - Complex Staple / Suture removal (26 or more) 0 []  - Hypo / Hyperglycemic Management (close monitor of Blood Glucose) 0 []  - Ankle / Brachial Index (ABI) - do not check if billed separately 0 X - Vital Signs 1 5 Has the patient been seen at the hospital within the last three years: Yes Total Score: 95 Level Of Care: New/Established - Level 3 Electronic Signature(s) Signed: 08/05/2017 4:35:12 PM By: Montey Hora Entered By: Montey Hora on 08/05/2017 16:18:56 [name] ([id number]) -------------------------------------------------------------------------------- Encounter Discharge Information Details [name]. Date of Service: 08/05/2017 12:30 PM Medical Record Patient Account Number: [id number] Number: Treating RN: Montey Hora 02/20/1934 (83 y.o. Other Clinician: Date of Birth/Sex: Female) Treating ROBSON, MICHAEL Primary Care Reiss Mowrey: Loura Pardon Berdie Malter/Extender: G Referring Talis Iwan: Ayesha Mohair in Treatment: 3 Encounter Discharge  Information Items Discharge Pain Level: 0 Discharge Condition: Stable Ambulatory Status: Walker Discharge Destination: Home Transportation: Private Auto Accompanied By: dtr Schedule Follow-up Appointment: Yes Medication Reconciliation completed and provided to Patient/Care No Aimy Sweeting: Provided on Clinical Summary of Care: 08/05/2017 Form Type Recipient Paper Patient FW Electronic Signature(s) Signed: 08/07/2017 3:19:14 PM By: Ruthine Dose Entered By: Ruthine Dose on 08/05/2017 13:08:54 [name] ([id number]) -------------------------------------------------------------------------------- Lower Extremity Assessment Details [name]. Date of Service: 08/05/2017 12:30 PM Medical Record Patient Account Number: [id number] Number: Treating RN: Montey Hora November 23, 1933 (83 y.o. Other Clinician: Date of Birth/Sex: Female) Treating ROBSON, MICHAEL Primary Care Deanna Wiater: Loura Pardon Sharel Behne/Extender: G Referring Williom Cedar: Loura Pardon Weeks in Treatment: 3 Vascular Assessment Pulses: Dorsalis Pedis Palpable: [Left:Yes] Posterior Tibial Extremity colors, hair growth, and conditions: Extremity Color: [Left:Normal] Hair Growth on Extremity: [Left:Yes] Temperature of Extremity: [Left:Warm] Capillary Refill: [Left:< 3 seconds] Electronic Signature(s) Signed: 08/05/2017 4:35:12 PM By: Montey Hora Entered By: Montey Hora on 08/05/2017 12:48:31 [name] ([id number]) -------------------------------------------------------------------------------- Multi Wound Chart Details [name]. Date of Service: 08/05/2017 12:30 PM Medical Record Patient Account Number: [id number] Number: Treating RN: Montey Hora [birth year] (83 y.o. Other Clinician: Date of Birth/Sex: Female) Treating ROBSON, MICHAEL Primary Care Joanann Mies: Loura Pardon Dilan Novosad/Extender: G Referring Ulices Maack: Loura Pardon Weeks in  Treatment: 3 Vital Signs Height(in): 66 Pulse(bpm): 69 Weight(lbs): 218.3 Blood Pressure 134/54 (mmHg): Body Mass Index(BMI): 35 Temperature(F): 98.3 Respiratory Rate 18 (breaths/min): Photos: [4:No Photos] [5:No Photos] [N/A:N/A] Wound Location: [4:Left, Anterior Knee] [5:Left Knee - Posterior] [N/A:N/A] Wounding Event: [4:Gradually Appeared] [5:Gradually Appeared] [N/A:N/A] Primary Etiology: [4:Abscess] [5:Abscess] [N/A:N/A] Comorbid History: [4:N/A] [5:Cataracts, Sleep Apnea, N/A Arrhythmia, Hypertension, Scleroderma] Date Acquired: [4:07/07/2017] [5:07/07/2017] [N/A:N/A] Weeks of Treatment: [4:3] [5:3] [N/A:N/A] Wound Status: [4:Healed - Epithelialized] [5:Open] [N/A:N/A] Measurements L x W x D 0x0x0 [5:0.2x0.2x1.1] [N/A:N/A] (  cm) Area (cm) : [4:0] [5:0.031] [N/A:N/A] Volume (cm) : [4:0] [5:0.035] [N/A:N/A] % Reduction in Area: [4:100.00%] [5:-29.20%] [N/A:N/A] % Reduction in Volume: 100.00% [5:28.60%] [N/A:N/A] Classification: [4:Partial Thickness] [5:Full Thickness Without Exposed Support Structures] [N/A:N/A] Exudate Amount: [4:N/A] [5:Large] [N/A:N/A] Exudate Type: [4:N/A] [5:Purulent] [N/A:N/A] Exudate Color: [4:N/A] [5:yellow, brown, green] [N/A:N/A] Wound Margin: [4:N/A] [5:Distinct, outline attached] [N/A:N/A] Granulation Amount: [4:N/A] [5:Large (67-100%)] [N/A:N/A] Granulation Quality: [4:N/A] [5:Red] [N/A:N/A] Necrotic Amount: [4:N/A] [5:None Present (0%)] [N/A:N/A] Epithelialization: [4:N/A] [5:None] [N/A:N/A] Periwound Skin Texture: No Abnormalities Noted [5:No Abnormalities Noted] [N/A:N/A] Periwound Skin No Abnormalities Noted No Abnormalities Noted N/A Moisture: Periwound Skin Color: No Abnormalities Noted No Abnormalities Noted N/A Temperature: N/A No Abnormality N/A Tenderness on No Yes N/A Palpation: Wound Preparation: N/A Ulcer Cleansing: N/A Rinsed/Irrigated with Saline Topical Anesthetic Applied: None Treatment Notes Electronic  Signature(s) Signed: 08/05/2017 4:12:03 PM By: Linton Ham MD Entered By: Linton Ham on 08/05/2017 13:12:11 [name] ([id number]) -------------------------------------------------------------------------------- [place] Details [name]. Date of Service: 08/05/2017 12:30 PM Medical Record Patient Account Number: [id number] Number: Treating RN: Montey Hora 07-31-1934 (83 y.o. Other Clinician: Date of Birth/Sex: Female) Treating ROBSON, MICHAEL Primary Care Samuel Mcpeek: Loura Pardon Arsenia Goracke/Extender: G Referring Shiloh Swopes: Loura Pardon Weeks in Treatment: 3 Active Inactive ` Abuse / Safety / Falls / Self Care Management Nursing Diagnoses: Potential for falls Goals: Patient will not experience any injury related to falls Date Initiated: 07/15/2017 Target Resolution Date: 10/31/2017 Goal Status: Active Interventions: Assess Activities of Daily Living upon admission and as needed Assess fall risk on admission and as needed Notes: ` Nutrition Nursing Diagnoses: Imbalanced nutrition Potential for alteratiion in Nutrition/Potential for imbalanced nutrition Goals: Patient/caregiver agrees to and verbalizes understanding of need to use nutritional supplements and/or vitamins as prescribed Date Initiated: 07/15/2017 Target Resolution Date: 10/31/2017 Goal Status: Active Interventions: Assess patient nutrition upon admission and as needed per policy Notes: [name], [name] ([id number]) Orientation to the Wound Care Program Nursing Diagnoses: Knowledge deficit related to the wound healing center program Goals: Patient/caregiver will verbalize understanding of the [place] Program Date Initiated: 07/15/2017 Target Resolution Date: 08/01/2017 Goal Status: Active Interventions: Provide education on orientation to the wound center Notes: ` Pain, Acute or Chronic Nursing Diagnoses: Pain, acute or chronic:  actual or potential Potential alteration in comfort, pain Goals: Patient/caregiver will verbalize adequate pain control between visits Date Initiated: 07/15/2017 Target Resolution Date: 10/31/2017 Goal Status: Active Interventions: Complete pain assessment as per visit requirements Notes: ` Wound/Skin Impairment Nursing Diagnoses: Impaired tissue integrity Knowledge deficit related to ulceration/compromised skin integrity Goals: Ulcer/skin breakdown will have a volume reduction of 80% by week 12 Date Initiated: 07/15/2017 Target Resolution Date: 10/24/2017 Goal Status: Active Interventions: Assess patient/caregiver ability to perform ulcer/skin care regimen upon admission and as needed Assess ulceration(s) every visit [name], [name] ([id number]) Notes: Electronic Signature(s) Signed: 08/05/2017 4:35:12 PM By: Montey Hora Entered By: Montey Hora on 08/05/2017 12:52:58 [name] ([id number]) -------------------------------------------------------------------------------- Pain Assessment Details [name]. Date of Service: 08/05/2017 12:30 PM Medical Record Patient Account Number: [id number] Number: Treating RN: Montey Hora [birth year] (83 y.o. Other Clinician: Date of Birth/Sex: Female) Treating ROBSON, MICHAEL Primary Care Nikolaus Pienta: Loura Pardon Elvin Mccartin/Extender: G Referring Brach Birdsall: Loura Pardon Weeks in Treatment: 3 Active Problems Location of Pain Severity and Description of Pain Patient Has Paino No Site Locations Pain Management and Medication Current Pain Management: Goals for Pain Management only hurts when walking Notes Topical or injectable lidocaine is offered  to patient for acute pain when surgical debridement is performed. If needed, Patient is instructed to use over the counter pain medication for the following 24-48 hours after debridement. Wound care MDs do not prescribed pain medications. Patient has chronic  pain or uncontrolled pain. Patient has been instructed to make an appointment with their Primary Care Physician for pain management. Electronic Signature(s) Signed: 08/05/2017 4:35:12 PM By: Montey Hora Entered By: Montey Hora on 08/05/2017 12:42:15 [name] ([id number]) -------------------------------------------------------------------------------- Patient/Caregiver Education Details [name]. Date of Service: 08/05/2017 12:30 PM Medical Record Patient Account Number: [id number] Number: Treating RN: Montey Hora [birth year] (83 y.o. Other Clinician: Date of Birth/Gender: Female) Treating ROBSON, MICHAEL Primary Care Physician: Loura Pardon Physician/Extender: G Referring Physician: Ayesha Mohair in Treatment: 3 Education Assessment Education Provided To: Patient Education Topics Provided Wound/Skin Impairment: Handouts: Other: wound care as ordered Methods: Demonstration, Explain/Verbal Responses: State content correctly Electronic Signature(s) Signed: 08/05/2017 4:35:12 PM By: Montey Hora Entered By: Montey Hora on 08/05/2017 12:55:04 [name] ([id number]) -------------------------------------------------------------------------------- Wound Assessment Details [name]. Date of Service: 08/05/2017 12:30 PM Medical Record Patient Account Number: [id number] Number: Treating RN: Montey Hora Jan 29, 1934 (83 y.o. Other Clinician: Date of Birth/Sex: Female) Treating ROBSON, MICHAEL Primary Care Keeva Reisen: Loura Pardon Kimbely Whiteaker/Extender: G Referring Vincentina Sollers: Loura Pardon Weeks in Treatment: 3 Wound Status Wound Number: 4 Primary Etiology: Abscess Wound Location: Left, Anterior Knee Wound Status: Healed - Epithelialized Wounding Event: Gradually Appeared Date Acquired: 07/07/2017 Weeks Of Treatment: 3 Clustered Wound: No Wound Measurements Length: (cm) 0 Width: (cm) 0 Depth:  (cm) 0 Area: (cm) 0 Volume: (cm) 0 % Reduction in Area: 100% % Reduction in Volume: 100% Wound Description Classification: Partial Thickness Periwound Skin Texture Texture Color No Abnormalities Noted: No No Abnormalities Noted: No Moisture No Abnormalities Noted: No Electronic Signature(s) Signed: 08/05/2017 4:35:12 PM By: Montey Hora Entered By: Montey Hora on 08/05/2017 12:47:38 [name] ([id number]) -------------------------------------------------------------------------------- Wound Assessment Details [name]. Date of Service: 08/05/2017 12:30 PM Medical Record Patient Account Number: [id number] Number: Treating RN: Montey Hora 1934/07/19 (83 y.o. Other Clinician: Date of Birth/Sex: Female) Treating ROBSON, MICHAEL Primary Care Aleighya Mcanelly: Loura Pardon Alyda Megna/Extender: G Referring Yasamin Karel: Loura Pardon Weeks in Treatment: 3 Wound Status Wound Number: 5 Primary Abscess Etiology: Wound Location: Left Knee - Posterior Wound Open Wounding Event: Gradually Appeared Status: Date Acquired: 07/07/2017 Comorbid Cataracts, Sleep Apnea, Arrhythmia, Weeks Of Treatment: 3 History: Hypertension, Scleroderma Clustered Wound: No Wound Measurements Length: (cm) 0.2 Width: (cm) 0.2 Depth: (cm) 1.1 Area: (cm) 0.031 Volume: (cm) 0.035 % Reduction in Area: -29.2% % Reduction in Volume: 28.6% Epithelialization: None Tunneling: No Undermining: No Wound Description Full Thickness Without Exposed Classification: Support Structures Wound Margin: Distinct, outline attached Exudate Large Amount: Exudate Type: Purulent Exudate Color: yellow, brown, green Foul Odor After Cleansing: No Slough/Fibrino Yes Wound Bed Granulation Amount: Large (67-100%) Granulation Quality: Red Necrotic Amount: None Present (0%) Periwound Skin Texture Texture Color No Abnormalities Noted: No No Abnormalities Noted: No Moisture  Temperature / Pain No Abnormalities Noted: No Temperature: No Abnormality Tenderness on Palpation: Yes Wound Preparation [name], [name] ([id number]) Ulcer Cleansing: Rinsed/Irrigated with Saline Topical Anesthetic Applied: None Treatment Notes Wound #5 (Left, Posterior Knee) 1. Cleansed with: Clean wound with Normal Saline 4. Dressing Applied: Iodoform packing Gauze 5. Secondary Dressing Applied Dry Gauze 7. Secured with Recruitment consultant) Signed: 08/05/2017 4:35:12 PM By: Montey Hora Entered By: Montey Hora on 08/05/2017 12:47:57 [name],  [name] ([id number]) -------------------------------------------------------------------------------- Vitals Details Patient Name: ANADALAY, MACDONELL. Date of Service: 08/05/2017 12:30 PM Medical Record Patient Account Number: [id number] Number: Treating RN: Montey Hora [birth year] (83 y.o. Other Clinician: Date of Birth/Sex: Female) Treating ROBSON, MICHAEL Primary Care Gordon Vandunk: Loura Pardon Quenten Nawaz/Extender: G Referring Karizma Cheek: Loura Pardon Weeks in Treatment: 3 Vital Signs Time Taken: 12:42 Temperature (F): 98.3 Height (in): 66 Pulse (bpm): 69 Weight (lbs): 218.3 Respiratory Rate (breaths/min): 18 Body Mass Index (BMI): 35.2 Blood Pressure (mmHg): 134/54 Reference Range: 80 - 120 mg / dl Electronic Signature(s) Signed: 08/05/2017 4:35:12 PM By: Montey Hora Entered By: Montey Hora on 08/05/2017 12:42:39

## 2017-08-14 ENCOUNTER — Encounter: Payer: Self-pay | Admitting: Gastroenterology

## 2017-08-14 ENCOUNTER — Ambulatory Visit (INDEPENDENT_AMBULATORY_CARE_PROVIDER_SITE_OTHER): Payer: Medicare Other | Admitting: Gastroenterology

## 2017-08-14 VITALS — BP 125/80 | HR 69 | Ht 65.0 in | Wt 217.8 lb

## 2017-08-14 DIAGNOSIS — K6389 Other specified diseases of intestine: Secondary | ICD-10-CM | POA: Diagnosis not present

## 2017-08-14 DIAGNOSIS — K529 Noninfective gastroenteritis and colitis, unspecified: Secondary | ICD-10-CM | POA: Diagnosis not present

## 2017-08-14 NOTE — Progress Notes (Signed)
[name] Darby, MD [address]  [facility]  [place], [place] [address]  Main: [phone]  Fax: [phone]    Gastroenterology Consultation  Referring Provider:     Abner Greenspan, MD Primary Care Physician:  Tower, Wynelle Fanny, MD Primary Gastroenterologist:  Dr. Cephas [name] Reason for Consultation:     Chronic diarrhea        HPI:   [name] is a 83 y.o. y/o female referred by Dr. [name], Wynelle Fanny, MD  for consultation & management of Chronic diarrhea. She is accompanied by her daughter today. 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 Since January 2018, she has been experiencing nonbloody loose bowel movements, occurring about once or twice a week. Each time, she would have 2-3 bowel movements, and she had episodes of incontinence per her daughter. In between these episodes, she does not have any BM. She tried Citrucel but not helping. She will take Imodium after one bowel movement when she has diarrhea. Prior to January, she reports that she was going daily or every other day. She denies any rectal bleeding, abdominal pain, abdominal distention, weight loss. She also takes artificial sweeteners. She had hernia repair and cholecystectomy.  Her C. difficile was negative on 07/01/2017.  Follow up visit 08/14/2017:  She reports that her diarrhea resolved. I treated her with 2 weeks course of rifaximin 50 mg 3 times daily and she reports that her diarrhea resolved. She is currently having 1 bowel movement every other day and formed. She denies any rectal bleeding. She also cut back on using artificial sweeteners.  GI Procedures: Colonoscopy 10/24/2014, two 7 mm polyps, pathology sessile serrated adenoma, diverticulosis  Past Medical History:  Diagnosis Date  . Anemia   . Arthritis    knee  . Diverticulosis   . Hx of colonic polyps 10/24/2014  . Hyperlipidemia   . Hypertension   . Osteopenia   .  Overactive bladder    urge incontinence  . PSVT (paroxysmal supraventricular tachycardia) ([place])   . Pulmonary emboli ([place])   . Pulmonary hypertension ([place])   . Right ventricular failure ([place])   . Sleep apnea    CPAP  . Spinal stenosis     Past Surgical History:  Procedure Laterality Date  . BLADDER SUSPENSION  1993  . CARDIAC CATHETERIZATION N/A 02/28/2015   Procedure: Right/Left Heart Cath and Coronary Angiography;  Surgeon: Troy Sine, MD;  Location: [facility];  Service: Cardiovascular;  Laterality: N/A;  . CATARACT EXTRACTION Bilateral 2008  . CHOLECYSTECTOMY    . COLONOSCOPY    . COLONOSCOPY N/A 10/24/2014   Procedure: COLONOSCOPY;  Surgeon: Gatha Mayer, MD;  Location: [facility];  Service: Endoscopy;  Laterality: N/A;  . CYST REMOVAL LEG Left 02/15/2016   cyst removal from back of left knee  . ELECTROPHYSIOLOGIC STUDY N/A 03/02/2015   Procedure: SVT Ablation;  Surgeon: Evans Lance, MD;  Location: [facility];  Service: Cardiovascular;  Laterality: N/A;  . INCISIONAL HERNIA REPAIR    . RIGHT HEART CATHETERIZATION N/A 07/04/2014   Procedure: RIGHT HEART CATH;  Surgeon: Jolaine Artist, MD;  Location: [facility] C[facility];  Service: Cardiovascular;  Laterality: N/A;  . TONSILLECTOMY AND ADENOIDECTOMY    . TOTAL KNEE ARTHROPLASTY Left 2000  . TOTAL KNEE ARTHROPLASTY Left 10/29/2012   at [facility]  . TOTAL KNEE ARTHROPLASTY Right 03/09/2014  . TOTAL KNEE  REVISION  01/12/2012   Procedure: TOTAL KNEE REVISION;  Surgeon: Kerin Salen, MD;  Location: [facility];  Service: Orthopedics;  Laterality: Left;  DEPUY/ LCS , HAND SET  . TRIGGER FINGER RELEASE  01/12/2012   Procedure: RELEASE TRIGGER FINGER/A-1 PULLEY;  Surgeon: Kerin Salen, MD;  Location: [facility];  Service: Orthopedics;  Laterality: Right;    Prior to Admission medications   Medication Sig Start Date End Date Taking? Authorizing Provider  benzonatate (TESSALON) 200 MG capsule TAKE ONE CAPSULE BY MOUTH TWICE DAILY  ASNEEDED FOR COUGH Patient taking differently: Take 200 mg by mouth daily. TAKE ONE CAPSULE BY MOUTH TWICE DAILY ASNEEDED FOR COUGH 12/31/16   Parrett, Fonnie Mu, NP  Calcium Carb-Cholecalciferol (CALCIUM 600/VITAMIN D3) 600-800 MG-UNIT TABS Take 1 tablet by mouth 2 (two) times daily.    [provider]  cephALEXin (KEFLEX) 500 MG capsule  06/12/17   [provider]  Cholecalciferol (VITAMIN D-1000 MAX ST) 1000 UNITS tablet Take 1,000 Units by mouth daily. 09/08/15   [provider]  FLUoxetine (PROZAC) 10 MG capsule Take 1 capsule (10 mg total) by mouth daily. 07/03/17   Tower, Wynelle Fanny, MD  hydrochlorothiazide (HYDRODIURIL) 25 MG tablet TAKE 1 TABLET DAILY 07/09/17   Tower, Wynelle Fanny, MD  metoprolol tartrate (LOPRESSOR) 25 MG tablet TAKE ONE-HALF (1/2) OF A TABLET BY MOUTHTWICE DAILY *PT DUE FOR APPT,MUST CALL TO SCHEDULE [phone]!* 04/08/17   Allred, Jeneen Rinks, MD  naproxen sodium (ANAPROX) 220 MG tablet Take 220 mg by mouth daily.     [provider]  OXYGEN Inhale 2 L into the lungs at bedtime.    [provider]  Polyethyl Glycol-Propyl Glycol (SYSTANE) 0.4-0.3 % SOLN Apply 1 drop to eye 2 (two) times daily.    [provider]  polyethylene glycol (MIRALAX / GLYCOLAX) packet Take 17 g by mouth daily as needed (CONSTIPATOIN).     [provider]  rifampin (RIFADIN) 300 MG capsule  05/26/17   [provider]    Family History  Problem Relation Age of Onset  . Breast cancer Mother 63  . Heart disease Father   . Diabetes Father   . Hypertension Father   . Uterine cancer Sister      Social History  Substance Use Topics  . Smoking status: Never Smoker  . Smokeless tobacco: Never Used  . Alcohol use No    Allergies as of 08/14/2017 - Review Complete 08/14/2017  Allergen Reaction Noted  . Prednisone Other (See Comments) 02/23/2014  . Zithromax [azithromycin] Other (See Comments) 02/19/2015  . Penicillins Hives and Swelling  02/23/2007  . Sulfonamide derivatives Hives 02/23/2007  . Adhesive [tape] Other (See Comments) and Rash 09/29/2013    Review of Systems:    All systems reviewed and negative except where noted in HPI.   Physical Exam:  BP 125/80 (BP Location: Left Arm, Patient Position: Sitting, Cuff Size: Large)   Pulse 69   Ht 5\' 5"  (1.651 m)   Wt 217 lb 12.8 oz (98.8 kg)   BMI 36.24 kg/m  No LMP recorded. Patient is postmenopausal.  General:   Alert,  Well-developed, well-nourished, pleasant and cooperative in NAD Head:  Normocephalic and atraumatic. Eyes:  Sclera clear, no icterus.   Conjunctiva pink. Ears:  Normal auditory acuity. Nose:  No deformity, discharge, or lesions. Mouth:  No deformity or lesions,oropharynx pink & moist. Neck:  Supple; no masses or thyromegaly. Lungs:  Respirations even and unlabored.  Clear throughout to auscultation.  No wheezes, crackles, or rhonchi. No acute distress. Heart:  Regular rate and rhythm; no murmurs, clicks, rubs, or gallops. Abdomen:  Normal bowel sounds.  No bruits.  Soft, non-tender and non-distended without masses, hepatosplenomegaly or hernias noted.  No guarding or rebound tenderness.   Rectal: Nor performed Msk:  Symmetrical without gross deformities Pulses:  Normal pulses noted. Extremities:  No clubbing or edema.  No cyanosis. Neurologic:  Alert and oriented x3;  grossly normal neurologically. Psych:  Alert and cooperative. Normal mood and affect.  Imaging Studies: X-ray KUB revealed mild to moderate amount of stool  Assessment and Plan:   NYAISHA SIMAO is a 83 y.o. y/o female with chronic antibiotics to prevent prosthetic wound infection of the knee joint, with 9 months history of chronic, Intermittent watery diarrhea associated with fecal incontinence. Stool studies negative for C. difficile infection. She probably had bacterial overgrowth secondary to chronic antibiotic use which she responded to 2 weeks course of rifaximin 550 mg 3  times a day. Her diarrhea is currently resolved. She had no other constitutional symptoms to warrant colonoscopy.  Follow up as needed  [name] Darby, MD

## 2017-09-02 ENCOUNTER — Encounter: Payer: Medicare Other | Attending: Internal Medicine | Admitting: Internal Medicine

## 2017-09-02 DIAGNOSIS — Z9981 Dependence on supplemental oxygen: Secondary | ICD-10-CM | POA: Insufficient documentation

## 2017-09-02 DIAGNOSIS — Z96652 Presence of left artificial knee joint: Secondary | ICD-10-CM | POA: Diagnosis not present

## 2017-09-02 DIAGNOSIS — B9561 Methicillin susceptible Staphylococcus aureus infection as the cause of diseases classified elsewhere: Secondary | ICD-10-CM | POA: Insufficient documentation

## 2017-09-02 DIAGNOSIS — L97928 Non-pressure chronic ulcer of unspecified part of left lower leg with other specified severity: Secondary | ICD-10-CM | POA: Insufficient documentation

## 2017-09-02 DIAGNOSIS — M349 Systemic sclerosis, unspecified: Secondary | ICD-10-CM | POA: Insufficient documentation

## 2017-09-02 DIAGNOSIS — I10 Essential (primary) hypertension: Secondary | ICD-10-CM | POA: Insufficient documentation

## 2017-09-02 DIAGNOSIS — Z882 Allergy status to sulfonamides status: Secondary | ICD-10-CM | POA: Insufficient documentation

## 2017-09-02 DIAGNOSIS — M71062 Abscess of bursa, left knee: Secondary | ICD-10-CM | POA: Diagnosis not present

## 2017-09-02 DIAGNOSIS — G4730 Sleep apnea, unspecified: Secondary | ICD-10-CM | POA: Diagnosis not present

## 2017-09-02 DIAGNOSIS — L02416 Cutaneous abscess of left lower limb: Secondary | ICD-10-CM | POA: Insufficient documentation

## 2017-09-04 NOTE — Progress Notes (Signed)
PHEOBE, SANDIFORD ([id number]) Visit Report for 09/02/2017 HPI Details [name], [name]. Date of Service: 09/02/2017 11:00 AM Medical Record Number: [id number] Patient Account Number: [id number] Date of Birth/Sex: [birth year] (83 y.o. Female) Treating RN: Montey Hora Primary Care Provider: Loura Pardon Other Clinician: Referring Provider: Loura Pardon Treating Provider/Extender: Tito Dine in Treatment: 7 History of Present Illness HPI Description: 10/28/16; this is an 83 year old woman who arrives with a complicated medical issue. 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 Initially in 2000 and apparently most recently in 2014. 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 Indeed she was hospitalized on 02/13/16 and discharged on 02/19/16. She was taken to the OR and it was found that the popliteal abscess was communicating with her joint space. Signed to feel fluid grew MSSA. Patient was treated with IandD of the abscess. She completed IV cefazolin on 03/28/16 and since then has been on Keflex and rifampin for chronic suppression. Her infectious disease doctor is Dr.Shrestha at [facility]. The patient was seen in our [place] sister clinic through much of October and November 2017. At that point she had a draining sinus that eventually closed over. She noted some pain behind her knee surrounding Christmas day and was seen in our emergency room on 10/23/16. During this ER visit she underwent tendon incision and drainage of a left Baker's cyst and clear colored synovial fluid was obtained. Doxycycline was added for a week to her Keflex and rifampin. A CT scan was ordered but I don't think was ever done due to artifacts from hardware. Her wound culture was negative. She has been using iodoform packing. She states her knee is painful when she stands on it for a period of time. She is not systemically unwell. She has a history of  scleroderma or without is been under control for some period of time. 11/04/16 the culture that I did of this area last week showed coag-negative staph and a few enterococcus faecium. Enterococcus is ampicillin resistant. The coag negative staph is only sensitive to vancomycin and tetracycline. Previous joint infection was methicillin sensitive staph aureus. It seems I misunderstood what she said and that her infectious disease clinic appointment is this Friday and the orthopedic appointment was last Friday. I faxed the culture results to the infectious disease clinic but I've given him a copy of it today. Per the patient and her family they orthopedic surgeon did not want to do anything further to this joint. The patient is not systemically unwell in particular no fever or chills and the pain she has is really minimal 11/18/16; this is a patient who has an infected left knee prosthetic joint. She went to see infectious disease at [facility] who apparently did did not recommend any further antibiotic therapy. She continues to feel the same episodic pain but no systemic symptoms of particular no fever. The depth of the wound in the popliteal fossa of her left knee is 3.5 versus 5 cm last week 11/25/16 4.5 cm of depth today. Draining exit site. We have treating this with iodoform packing however I really have no expectation of healing and to be truthful the amount of drainage that is coming out of this knee I don't think closure of this site is necessarily considered a positive outcome. Her left knee remains warm but not painful there is an effusion she has no systemic symptoms no fever no chills or and she feels well 12/31/16. Still is  same amount of depth today using a skinny at 4.6 cm. There is less drainage. We have been using iodoform packing and I really have no expectation of healing and to be truthful closing this sinus probably is really not the best thing to consider. The patient has an infected  artificial knee joint on the left. She is not felt to be a candidate for a fourth replacement. In the meantime she does not complain of pain except when the dressing is being changed episodically. She has not been systemically unwell no fever or chills or other issues. 01/14/17; after the patient left on 3/7 her daughter tells me the next day the wound had closed over and therefore this is not intact since. Over the last 3 days the patient has noted significant pain in the popliteal fossa of the involved left knee. 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 MSSA was the cause of the original identified joint infection treated at [facility]. 01/21/17; I saw this patient last week at which time there was intense erythema in the popliteal fossa, tenderness and a ballotable fluid collection. I did an IandD on this. Surprisingly the culture of this was negative although as a result of the IandD her tenderness and erythema in the area is resolved. We continue with the iodoform packing READMISSION 04/07/17; this is a patient I know from 2 prior admissions to this clinic starting in January of this year. Her history is essentially as noted above. 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 ([id number]) suppressive antibiotics. She has a draining sinus into the popliteal fossa of the left knee. This remains open and drains. She has been using iodoform packing to this area with gauze over the top. To be truthful I haven't really been certain whether having this area close would be helpful or harmful to this patient. Previously when it's closed she develops a lot more pain in the back of the popliteal fossa and more pain in the knee. She is here today because the packing of the area has become increasingly painful in fact the patient describes this as a "torture chamber"  experience. They are packing this wound twice a day. The patient follows with orthopedic surgery and infectious disease at [facility] although she hasn't seen them since she was last here in March. She denies any systemic symptoms including fever chills etc. She states except for the changing of the dressing her pain when she is mobilizing on the knee is 2/10. She also has a area on the left anterior patella in the incision that is excoriated open. The patient states this is itchy and she does a lot of scratching. They state this was here last time she was here but I have no recollection or documentation of this 04/21/17; patient was readmitted to clinic 2 weeks ago. She has a draining sinus in the left popliteal fossa and a chronically infected left total knee replacement. She also had an excoriation on the left anterior knee. She arrives in clinic today with all of this looking considerably better. The divot in the popliteal fossa is actually closed. READMISSION 07/15/17; the patient arrived today with a real opening of the draining sinus in her left popliteal fossa. This opened about a week ago after developing a painful "knot". Is been draining clear fluid. The patient's pain is a lot better. She is not  been systemically unwell. oShe also is been dealing with an area on the left anterior leg just below the tibial plateau. She also had this last time. Her daughter states that she scratches this area continuously. I have reviewed the last infectious disease note from August. 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 Much to my chagrin they seem reluctant to tap the joint. A culture of purulent material that came out of the popliteal fossa earlier this year cultured coag-negative staph aureus [not an uncommon cause of prosthetic joint infection]. However they really ignored this and they state that the source  was questionable. If they believe this I am wondering why they're so reluctant to tap the fluid. She has not seen orthopedics 07/22/17; patient's sinus tract in the popliteal fossa about 2.5 cm. The area on the anterior part of her knee looks a lot better. We have been using iodoform packing to the popliteal fossa, collagen to the anterior part of her knee. She is not experiencing undue pain or systemic symptoms 08/05/17; patient has a sinus tract in the popliteal fossa on the left. 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 She does not have any options for further replacements she is only been offered an above-knee amputation. 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 She has not been systemically unwell no fever. There is some pain and swelling in the knee but all in all this is not unbearable. They've been using iodoform packing 09/02/17; continued sinus tract on the popliteal fossa on the left as part of a chronic infection on a left total knee replacement. She follows with infectious disease at [facility] although the patient and her daughter are not exactly sure when her next appointment is. She has not been systemically unwell specifically denies fever chills. She has some pain in the left knee however she is reluctant to take even simple analgesics because of the risk of" addiction" Electronic Signature(s) Signed: 09/02/2017 5:39:07 PM By: Linton Ham MD Entered By: Linton Ham on 09/02/2017 14:59:43 Mcgrady, [name] ([id number]) -------------------------------------------------------------------------------- Physical Exam Details [name]. Date of Service: 09/02/2017 11:00 AM Medical Record Number: [id number] Patient Account Number: [id number] Date of Birth/Sex: 1934-05-29 (83 y.o. Female) Treating RN: Montey Hora Primary Care Provider: Loura Pardon Other Clinician: Referring Provider: Loura Pardon Treating  Provider/Extender: Tito Dine in Treatment: 7 Constitutional Sitting or standing Blood Pressure is within target range for patient.. Pulse regular and within target range for patient.Marland Kitchen Respirations regular, non-labored and within target range.. Temperature is normal and within the target range for the patient.Marland Kitchen appears in no distress. Eyes Conjunctivae clear. No discharge. Respiratory Respiratory effort is easy and symmetric bilaterally. Rate is normal at rest and on room air.. Cardiovascular peda;r pulses are palpable on the left also with the left femoral. Lymphatic None palpable in the left popliteal or inguinal area. Musculoskeletal The left knee itself is unsteady. Clearly a small effusion although I've felt worse in this patient. Notes wound exam; the wound on the anterior left knee is epithelialized. The draining sinus posteriorly has some clear yellow fluid which looks like sinus feel fluid. There is no tenderness in this area no palpable adenopathy and no evidence of soft tissue infection. Electronic Signature(s) Signed: 09/02/2017 5:39:07 PM By: Linton Ham MD Entered By: Linton Ham on 09/02/2017 15:04:07 [name] ([id number]) -------------------------------------------------------------------------------- Physician Orders  Details Patient Name: DOSHA, BROSHEARS. Date of Service: 09/02/2017 11:00 AM Medical Record Number: [id number] Patient Account Number: [id number] Date of Birth/Sex: August 08, 1934 (83 y.o. Female) Treating RN: Montey Hora Primary Care Provider: Loura Pardon Other Clinician: Referring Provider: Loura Pardon Treating Provider/Extender: Tito Dine in Treatment: 7 Verbal / Phone Orders: No Diagnosis Coding Wound Cleansing Wound #5 Left,Posterior Knee o Clean wound with Normal Saline. o Cleanse wound with mild soap and water o May Shower, gently pat wound dry prior to applying new dressing. Anesthetic Wound #5  Left,Posterior Knee o Topical Lidocaine 4% cream applied to wound bed prior to debridement Skin Barriers/Peri-Wound Care Wound #5 Left,Posterior Knee o Skin Prep Primary Wound Dressing Wound #5 Left,Posterior Knee o Aquacel Ag Secondary Dressing Wound #5 Left,Posterior Knee o Dry Gauze o Boardered Foam Dressing o Other - coban to secure Dressing Change Frequency Wound #5 Left,Posterior Knee o Change dressing every day. Follow-up Appointments Wound #5 Left,Posterior Knee o Return Appointment in 1 month Edema Control Wound #5 Left,Posterior Knee o Elevate legs to the level of the heart and pump ankles as often as possible Additional Orders / Instructions Wound #5 Left,Posterior Knee o Increase protein intake. [name], [name] ([id number]) Electronic Signature(s) Signed: 09/02/2017 5:08:04 PM By: Montey Hora Signed: 09/02/2017 5:39:07 PM By: Linton Ham MD Entered By: Montey Hora on 09/02/2017 11:49:51 [name] ([id number]) -------------------------------------------------------------------------------- Problem List Details Patient Name: KAYDENSE, RIZO. Date of Service: 09/02/2017 11:00 AM Medical Record Number: [id number] Patient Account Number: [id number] Date of Birth/Sex: [birth year] (83 y.o. 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 Date of Service: 09/02/2017 11:00 AM Medical Record Number: [id number] Patient Account Number: [id number] Date of Birth/Sex: 14-Dec-1933 (83 y.o. Female) Treating RN: Montey Hora Primary Care Provider: Loura Pardon Other Clinician: Referring Provider: Loura Pardon Treating Provider/Extender: Tito Dine in Treatment: 7 Subjective History of Present Illness (HPI) 10/28/16; this is an 83 year old woman who arrives with a complicated medical issue. 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 Initially in 2000 and apparently most recently in 2014. 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 Indeed she was hospitalized on 02/13/16 and discharged on 02/19/16. She was taken to the OR and it was found that the popliteal abscess was communicating with her joint space. Signed to feel fluid grew MSSA. Patient was treated with IandD of the abscess. She completed IV cefazolin on 03/28/16 and since then has been on Keflex and rifampin for chronic suppression. Her infectious disease doctor is Dr.Shrestha at [facility]. The patient was seen in our [place] sister clinic through much of October and November 2017. At that point she had a draining sinus that eventually closed over. She noted some pain behind her knee surrounding Christmas day and was seen in our emergency room on 10/23/16. During this ER  visit she underwent tendon incision and drainage of a left Baker's cyst and clear colored synovial fluid was obtained. Doxycycline was added for a week to her Keflex and rifampin. A CT scan was ordered but I don't think was ever done due to artifacts from  hardware. Her wound culture was negative. She has been using iodoform packing. She states her knee is painful when she stands on it for a period of time. She is not systemically unwell. She has a history of scleroderma or without is been under control for some period of time. 11/04/16 the culture that I did of this area last week showed coag-negative staph and a few enterococcus faecium. Enterococcus is ampicillin resistant. The coag negative staph is only sensitive to vancomycin and tetracycline. Previous joint infection was methicillin sensitive staph aureus. It seems I misunderstood what she said and that her infectious disease clinic appointment is this Friday and the orthopedic appointment was last Friday. I faxed the culture results to the infectious disease clinic but I've given him a copy of it today. Per the patient and her family they orthopedic surgeon did not want to do anything further to this joint. The patient is not systemically unwell in particular no fever or chills and the pain she has is really minimal 11/18/16; this is a patient who has an infected left knee prosthetic joint. She went to see infectious disease at [facility] who apparently did did not recommend any further antibiotic therapy. She continues to feel the same episodic pain but no systemic symptoms of particular no fever. The depth of the wound in the popliteal fossa of her left knee is 3.5 versus 5 cm last week 11/25/16 4.5 cm of depth today. Draining exit site. We have treating this with iodoform packing however I really have no expectation of healing and to be truthful the amount of drainage that is coming out of this knee I don't think closure of this site is necessarily considered a positive outcome. Her left knee remains warm but not painful there is an effusion she has no systemic symptoms no fever no chills or and she feels well 12/31/16. Still is same amount of depth today using a skinny at 4.6 cm. There is less  drainage. We have been using iodoform packing and I really have no expectation of healing and to be truthful closing this sinus probably is really not the best thing to consider. 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 She is not felt to be a candidate for a fourth replacement. In the meantime she does not complain of pain except when the dressing is being changed episodically. She has not been systemically unwell no fever or chills or other issues. 01/14/17; after the patient left on 3/7 her daughter tells me the next day the wound had closed over and therefore this is not intact since. Over the last 3 days the patient has noted significant pain in the popliteal fossa of the involved left knee. 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 MSSA was the cause of the original identified joint infection treated at [facility]. 01/21/17; I saw this patient last week at which time there was intense erythema in the popliteal fossa, tenderness and a ballotable fluid collection. I did an IandD on this. Surprisingly the culture of this was negative although as a result of the IandD her tenderness and erythema in the area is resolved. We  continue with the iodoform packing READMISSION 04/07/17; this is a patient I know from 2 prior admissions to this clinic starting in January of this year. Her history is essentially as noted above. 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 She has a draining sinus into the popliteal fossa of the left knee. This remains open and drains. She has been using iodoform packing to this area with gauze over the top. To be truthful I haven't really been certain whether [name], [name]. ([id number]) having this area close would be helpful or harmful to this patient. Previously when it's closed she develops a lot more  pain in the back of the popliteal fossa and more pain in the knee. She is here today because the packing of the area has become increasingly painful in fact the patient describes this as a "torture chamber" experience. They are packing this wound twice a day. The patient follows with orthopedic surgery and infectious disease at [facility] although she hasn't seen them since she was last here in March. She denies any systemic symptoms including fever chills etc. She states except for the changing of the dressing her pain when she is mobilizing on the knee is 2/10. She also has a area on the left anterior patella in the incision that is excoriated open. The patient states this is itchy and she does a lot of scratching. They state this was here last time she was here but I have no recollection or documentation of this 04/21/17; patient was readmitted to clinic 2 weeks ago. She has a draining sinus in the left popliteal fossa and a chronically infected left total knee replacement. She also had an excoriation on the left anterior knee. She arrives in clinic today with all of this looking considerably better. The divot in the popliteal fossa is actually closed. READMISSION 07/15/17; the patient arrived today with a real opening of the draining sinus in her left popliteal fossa. This opened about a week ago after developing a painful "knot". Is been draining clear fluid. The patient's pain is a lot better. She is not been systemically unwell. She also is been dealing with an area on the left anterior leg just below the tibial plateau. She also had this last time. Her daughter states that she scratches this area continuously. I have reviewed the last infectious disease note from August. 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 Much to my chagrin they seem reluctant to tap the joint. A culture of purulent material that came out  of the popliteal fossa earlier this year cultured coag-negative staph aureus [not an uncommon cause of prosthetic joint infection]. However they really ignored this and they state that the source was questionable. If they believe this I am wondering why they're so reluctant to tap the fluid. She has not seen orthopedics 07/22/17; patient's sinus tract in the popliteal fossa about 2.5 cm. The area on the anterior part of her knee looks a lot better. We have been using iodoform packing to the popliteal fossa, collagen to the anterior part of her knee. She is not experiencing undue pain or systemic symptoms 08/05/17; patient has a sinus tract in the popliteal fossa on the left. 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 She does not have any options for further replacements she is only been offered an above-knee  amputation. 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 She has not been systemically unwell no fever. There is some pain and swelling in the knee but all in all this is not unbearable. They've been using iodoform packing 09/02/17; continued sinus tract on the popliteal fossa on the left as part of a chronic infection on a left total knee replacement. She follows with infectious disease at [facility] although the patient and her daughter are not exactly sure when her next appointment is. She has not been systemically unwell specifically denies fever chills. She has some pain in the left knee however she is reluctant to take even simple analgesics because of the risk of" addiction" Objective Constitutional Sitting or standing Blood Pressure is within target range for patient.. Pulse regular and within target range for patient.Marland Kitchen Respirations regular, non-labored and within target range.. Temperature is normal and within the target range for the patient.Marland Kitchen appears in no distress. Vitals Time Taken: 11:35 AM, Height: 66 in, Weight: 218.3 lbs, BMI:  35.2, Temperature: 97.5 F, Pulse: 61 bpm, Respiratory Rate: 18 breaths/min, Blood Pressure: 125/59 mmHg. Eyes Conjunctivae clear. No discharge. [name], [name]. ([id number]) Respiratory Respiratory effort is easy and symmetric bilaterally. Rate is normal at rest and on room air.. Cardiovascular peda;r pulses are palpable on the left also with the left femoral. Lymphatic None palpable in the left popliteal or inguinal area. Musculoskeletal The left knee itself is unsteady. Clearly a small effusion although I've felt worse in this patient. General Notes: wound exam; the wound on the anterior left knee is epithelialized. The draining sinus posteriorly has some clear yellow fluid which looks like sinus feel fluid. There is no tenderness in this area no palpable adenopathy and no evidence of soft tissue infection. Integumentary (Hair, Skin) Wound #5 status is Open. Original cause of wound was Gradually Appeared. The wound is located on the Left,Posterior Knee. The wound measures 0.2cm length x 0.2cm width x 2cm depth; 0.031cm^2 area and 0.063cm^3 volume. There is no tunneling or undermining noted. There is a large amount of purulent drainage noted. The wound margin is distinct with the outline attached to the wound base. There is large (67-100%) red granulation within the wound bed. There is no necrotic tissue within the wound bed. Periwound temperature was noted as No Abnormality. The periwound has tenderness on palpation. 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 Cleanse wound with mild soap and water May Shower,  gently pat wound dry prior to applying new dressing. Anesthetic: Wound #5 Left,Posterior Knee: Topical Lidocaine 4% cream applied to wound bed prior to debridement Skin Barriers/Peri-Wound Care: Wound #5 Left,Posterior Knee: Skin Prep Primary Wound Dressing: [name], [name] ([id number]) Wound #5 Left,Posterior Knee: Aquacel Ag Secondary Dressing: Wound #5 Left,Posterior Knee: Dry Gauze Boardered Foam Dressing Other - coban to secure Dressing Change Frequency: Wound #5 Left,Posterior Knee: Change dressing every day. Follow-up Appointments: Wound #5 Left,Posterior Knee: Return Appointment in 1 month Edema Control: Wound #5 Left,Posterior Knee: Elevate legs to the level of the heart and pump ankles as often as possible Additional Orders / Instructions: Wound #5 Left,Posterior Knee: Increase protein intake. change the primary dressing to silver alginate rope f/u  in 1 months as i understand things no additional rx unless she becomes systemically ill Electronic Signature(s) Signed: 09/02/2017 5:39:07 PM By: Linton Ham MD Entered By: Linton Ham on 09/02/2017 15:17:19 [name], [name] ([id number]) -------------------------------------------------------------------------------- SuperBill Details [name]. Date of Service: 09/02/2017 Medical Record Number: [id number] Patient Account Number: [id number] Date of Birth/Sex: 07-Sep-1934 (83 y.o. Female) Treating RN: Montey Hora Primary Care Provider: Loura Pardon Other Clinician: Referring Provider: Loura Pardon Treating Provider/Extender: Tito Dine in Treatment: 7 Diagnosis Coding ICD-10 Codes Code Description M71.062 Abscess of bursa, left knee B95.61 Methicillin susceptible Staphylococcus aureus infection as the cause of diseases classified elsewhere L02.416 Cutaneous abscess of left lower limb L97.928 Non-pressure chronic ulcer of unspecified part of left lower leg with other specified  severity Facility Procedures CPT4 Code: 59747185 Description: 99213 - WOUND CARE VISIT-LEV 3 EST PT Modifier: Quantity: 1 Physician Procedures CPT4: Description Modifier Quantity Code 5015868 25749 - WC PHYS LEVEL 3 - EST PT 1 ICD-10 Diagnosis Description L97.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

## 2017-09-04 NOTE — Progress Notes (Signed)
CAMARIE, MCTIGUE ([id number]) Visit Report for 09/02/2017 Arrival Information Details Patient Name: TORUNN, CHANCELLOR. Date of Service: 09/02/2017 11:00 AM Medical Record Number: [id number] Patient Account Number: [id number] Date of Birth/Sex: April 28, 1934 (83 y.o. Female) Treating RN: Montey Hora Primary Care Nolia Tschantz: Loura Pardon Other Clinician: Referring Diana Armijo: Loura Pardon Treating Suhayb Anzalone/Extender: Tito Dine in Treatment: 7 Visit Information History Since Last Visit Added or deleted any medications: No Patient Arrived: Walker Any new allergies or adverse reactions: No Arrival Time: 11:33 Had a fall or experienced change in No Accompanied By: dtr activities of daily living that may affect Transfer Assistance: None risk of falls: Patient Identification Verified: Yes Signs or symptoms of abuse/neglect since last visito No Secondary Verification Process Completed: Yes Hospitalized since last visit: No Patient Requires Transmission-Based Precautions: No Has Dressing in Place as Prescribed: Yes Patient Has Alerts: No Pain Present Now: No Electronic Signature(s) Signed: 09/02/2017 5:08:04 PM By: Montey Hora Entered By: Montey Hora on 09/02/2017 11:33:29 [name] ([id number]) -------------------------------------------------------------------------------- Clinic Level of Care Assessment Details Patient Name: [name]. Date of Service: 09/02/2017 11:00 AM Medical Record Number: [id number] Patient Account Number: [id number] Date of Birth/Sex: 06-02-1934 (83 y.o. Female) Treating RN: Montey Hora Primary Care Kreston Ahrendt: Loura Pardon Other Clinician: Referring Rondi Ivy: Loura Pardon Treating Kayleah Appleyard/Extender: Tito Dine in Treatment: 7 Clinic Level of Care Assessment Items TOOL 4 Quantity Score []  - Use when only an EandM is performed on FOLLOW-UP visit 0 ASSESSMENTS - Nursing Assessment / Reassessment X - Reassessment of  Co-morbidities (includes updates in patient status) 1 10 X- 1 5 Reassessment of Adherence to Treatment Plan ASSESSMENTS - Wound and Skin Assessment / Reassessment X - Simple Wound Assessment / Reassessment - one wound 1 5 []  - 0 Complex Wound Assessment / Reassessment - multiple wounds []  - 0 Dermatologic / Skin Assessment (not related to wound area) ASSESSMENTS - Focused Assessment []  - Circumferential Edema Measurements - multi extremities 0 []  - 0 Nutritional Assessment / Counseling / Intervention X- 1 5 Lower Extremity Assessment (monofilament, tuning fork, pulses) []  - 0 Peripheral Arterial Disease Assessment (using hand held doppler) ASSESSMENTS - Ostomy and/or Continence Assessment and Care []  - Incontinence Assessment and Management 0 []  - 0 Ostomy Care Assessment and Management (repouching, etc.) PROCESS - Coordination of Care X - Simple Patient / Family Education for ongoing care 1 15 []  - 0 Complex (extensive) Patient / Family Education for ongoing care []  - 0 Staff obtains Programmer, systems, Records, Test Results / Process Orders []  - 0 Staff telephones HHA, Nursing Homes / Clarify orders / etc []  - 0 Routine Transfer to another Facility (non-emergent condition) []  - 0 Routine Hospital Admission (non-emergent condition) []  - 0 New Admissions / Biomedical engineer / Ordering NPWT, Apligraf, etc. []  - 0 Emergency Hospital Admission (emergent condition) X- 1 10 Simple Discharge Coordination JADENCE, KINLAW ([id number]) []  - 0 Complex (extensive) Discharge Coordination PROCESS - Special Needs []  - Pediatric / Minor Patient Management 0 []  - 0 Isolation Patient Management []  - 0 Hearing / Language / Visual special needs []  - 0 Assessment of Community assistance (transportation, D/C planning, etc.) []  - 0 Additional assistance / Altered mentation []  - 0 Support Surface(s) Assessment (bed, cushion, seat, etc.) INTERVENTIONS - Wound Cleansing / Measurement X -  Simple Wound Cleansing - one wound 1 5 []  - 0 Complex Wound Cleansing - multiple wounds X- 1 5 Wound Imaging (photographs - any number of wounds) []  - 0 Wound  Tracing (instead of photographs) X- 1 5 Simple Wound Measurement - one wound []  - 0 Complex Wound Measurement - multiple wounds INTERVENTIONS - Wound Dressings X - Small Wound Dressing one or multiple wounds 1 10 []  - 0 Medium Wound Dressing one or multiple wounds []  - 0 Large Wound Dressing one or multiple wounds []  - 0 Application of Medications - topical []  - 0 Application of Medications - injection INTERVENTIONS - Miscellaneous []  - External ear exam 0 []  - 0 Specimen Collection (cultures, biopsies, blood, body fluids, etc.) []  - 0 Specimen(s) / Culture(s) sent or taken to Lab for analysis []  - 0 Patient Transfer (multiple staff / Civil Service fast streamer / Similar devices) []  - 0 Simple Staple / Suture removal (25 or less) []  - 0 Complex Staple / Suture removal (26 or more) []  - 0 Hypo / Hyperglycemic Management (close monitor of Blood Glucose) []  - 0 Ankle / Brachial Index (ABI) - do not check if billed separately X- 1 5 Vital Signs Posadas, Blima Singer ([id number]) Has the patient been seen at the hospital within the last three years: Yes Total Score: 80 Level Of Care: New/Established - Level 3 Electronic Signature(s) Signed: 09/02/2017 5:08:04 PM By: Montey Hora Entered By: Montey Hora on 09/02/2017 15:09:10 [name] ([id number]) -------------------------------------------------------------------------------- Encounter Discharge Information Details Patient Name: [name]. Date of Service: 09/02/2017 11:00 AM Medical Record Number: [id number] Patient Account Number: [id number] Date of Birth/Sex: 05/28/1934 (83 y.o. Female) Treating RN: Montey Hora Primary Care Kester Stimpson: Loura Pardon Other Clinician: Referring Tyauna Lacaze: Loura Pardon Treating Balen Woolum/Extender: Tito Dine in Treatment:  7 Encounter Discharge Information Items Discharge Pain Level: 0 Discharge Condition: Stable Ambulatory Status: Walker Discharge Destination: Home Transportation: Private Auto Accompanied By: dtr Schedule Follow-up Appointment: Yes Medication Reconciliation completed and No provided to Patient/Care Avari Gelles: Provided on Clinical Summary of Care: 09/02/2017 Form Type Recipient Paper Patient FW Electronic Signature(s) Signed: 09/02/2017 4:27:00 PM By: Montey Hora Entered By: Montey Hora on 09/02/2017 16:27:00 [name] ([id number]) -------------------------------------------------------------------------------- Lower Extremity Assessment Details Patient Name: [name]. Date of Service: 09/02/2017 11:00 AM Medical Record Number: [id number] Patient Account Number: [id number] Date of Birth/Sex: [birth year] (83 y.o. Female) Treating RN: Montey Hora Primary Care Lovell Nuttall: Loura Pardon Other Clinician: Referring Daliah Chaudoin: Loura Pardon Treating Majid Mccravy/Extender: Tito Dine in Treatment: 7 Vascular Assessment Pulses: Dorsalis Pedis Palpable: [Left:Yes] Posterior Tibial Extremity colors, hair growth, and conditions: Extremity Color: [Left:Normal] Hair Growth on Extremity: [Left:No] Temperature of Extremity: [Left:Warm] Capillary Refill: [Left:< 3 seconds] Electronic Signature(s) Signed: 09/02/2017 5:08:04 PM By: Montey Hora Entered By: Montey Hora on 09/02/2017 11:48:27 [name] ([id number]) -------------------------------------------------------------------------------- Multi Wound Chart Details Patient Name: [name]. Date of Service: 09/02/2017 11:00 AM Medical Record Number: [id number] Patient Account Number: [id number] Date of Birth/Sex: 03/11/1934 (83 y.o. Female) Treating RN: Montey Hora Primary Care Yuma Pacella: Loura Pardon Other Clinician: Referring Briannah Lona: Loura Pardon Treating Cadence Minton/Extender: Tito Dine in Treatment: 7 Vital Signs Height(in): 66 Pulse(bpm): 61 Weight(lbs): 218.3 Blood Pressure(mmHg): 125/59 Body Mass Index(BMI): 35 Temperature(F): 97.5 Respiratory Rate 18 (breaths/min): Photos: [N/A:N/A] Wound Location: Left Knee - Posterior N/A N/A Wounding Event: Gradually Appeared N/A N/A Primary Etiology: Abscess N/A N/A Comorbid History: Cataracts, Sleep Apnea, N/A N/A Arrhythmia, Hypertension, Scleroderma Date Acquired: 07/07/2017 N/A N/A Weeks of Treatment: 7 N/A N/A Wound Status: Open N/A N/A Measurements L x W x D 0.2x0.2x2 N/A N/A (cm) Area (cm) : 0.031 N/A N/A Volume (cm) : 0.063 N/A N/A %  Reduction in Area: -29.20% N/A N/A % Reduction in Volume: -28.60% N/A N/A Classification: Full Thickness Without N/A N/A Exposed Support Structures Exudate Amount: Large N/A N/A Exudate Type: Purulent N/A N/A Exudate Color: yellow, brown, green N/A N/A Wound Margin: Distinct, outline attached N/A N/A Granulation Amount: Large (67-100%) N/A N/A Granulation Quality: Red N/A N/A Necrotic Amount: None Present (0%) N/A N/A Epithelialization: None N/A N/A Periwound Skin Texture: No Abnormalities Noted N/A N/A Periwound Skin Moisture: No Abnormalities Noted N/A N/A Periwound Skin Color: No Abnormalities Noted N/A N/A Temperature: No Abnormality N/A N/A ZAN, TRISKA ([id number]) Tenderness on Palpation: Yes N/A N/A Wound Preparation: Ulcer Cleansing: N/A N/A Rinsed/Irrigated with Saline Topical Anesthetic Applied: None Treatment Notes Electronic Signature(s) Signed: 09/02/2017 5:39:07 PM By: Linton Ham MD Entered By: Linton Ham on 09/02/2017 14:49:24 [name] ([id number]) -------------------------------------------------------------------------------- [place] Details Patient Name: [name]. Date of Service: 09/02/2017 11:00 AM Medical Record Number: [id number] Patient Account Number: [id number] Date of Birth/Sex:  1934-02-02 (83 y.o. Female) Treating RN: Montey Hora Primary Care Sandeep Radell: Loura Pardon Other Clinician: Referring Deshawn Witty: Loura Pardon Treating Shaunae Sieloff/Extender: Tito Dine in Treatment: 7 Active Inactive ` Abuse / Safety / Falls / Self Care Management Nursing Diagnoses: Potential for falls Goals: Patient will not experience any injury related to falls Date Initiated: 07/15/2017 Target Resolution Date: 10/31/2017 Goal Status: Active Interventions: Assess Activities of Daily Living upon admission and as needed Assess fall risk on admission and as needed Notes: ` Nutrition Nursing Diagnoses: Imbalanced nutrition Potential for alteratiion in Nutrition/Potential for imbalanced nutrition Goals: Patient/caregiver agrees to and verbalizes understanding of need to use nutritional supplements and/or vitamins as prescribed Date Initiated: 07/15/2017 Target Resolution Date: 10/31/2017 Goal Status: Active Interventions: Assess patient nutrition upon admission and as needed per policy Notes: ` Orientation to the Wound Care Program Nursing Diagnoses: Knowledge deficit related to the wound healing center program Goals: Patient/caregiver will verbalize understanding of the [facility] Date Initiated: 07/15/2017 Target Resolution Date: 08/01/2017 Goal Status: Active IYA, HAMED ([id number]) Interventions: Provide education on orientation to the wound center Notes: ` Pain, Acute or Chronic Nursing Diagnoses: Pain, acute or chronic: actual or potential Potential alteration in comfort, pain Goals: Patient/caregiver will verbalize adequate pain control between visits Date Initiated: 07/15/2017 Target Resolution Date: 10/31/2017 Goal Status: Active Interventions: Complete pain assessment as per visit requirements Notes: ` Wound/Skin Impairment Nursing Diagnoses: Impaired tissue integrity Knowledge deficit related to ulceration/compromised skin  integrity Goals: Ulcer/skin breakdown will have a volume reduction of 80% by week 12 Date Initiated: 07/15/2017 Target Resolution Date: 10/24/2017 Goal Status: Active Interventions: Assess patient/caregiver ability to perform ulcer/skin care regimen upon admission and as needed Assess ulceration(s) every visit Notes: Electronic Signature(s) Signed: 09/02/2017 5:08:04 PM By: Montey Hora Entered By: Montey Hora on 09/02/2017 11:48:38 [name] ([id number]) -------------------------------------------------------------------------------- Pain Assessment Details Patient Name: [name]. Date of Service: 09/02/2017 11:00 AM Medical Record Number: [id number] Patient Account Number: [id number] Date of Birth/Sex: 1934-04-06 (83 y.o. Female) Treating RN: Montey Hora Primary Care Camaria Gerald: Loura Pardon Other Clinician: Referring Shmiel Morton: Loura Pardon Treating Makenley Shimp/Extender: Tito Dine in Treatment: 7 Active Problems Location of Pain Severity and Description of Pain Patient Has Paino Yes Site Locations Pain Location: Generalized Pain With Dressing Change: No Duration of the Pain. Constant / Intermittento Intermittent Pain Management and Medication Current Pain Management: Goals for Pain Management pain when walking Notes Topical or injectable lidocaine is offered to patient for acute pain when  surgical debridement is performed. If needed, Patient is instructed to use over the counter pain medication for the following 24-48 hours after debridement. Wound care MDs do not prescribed pain medications. Patient has chronic pain or uncontrolled pain. Patient has been instructed to make an appointment with their Primary Care Physician for pain management. Electronic Signature(s) Signed: 09/02/2017 5:08:04 PM By: Montey Hora Entered By: Montey Hora on 09/02/2017 11:35:52 [name]  ([id number]) -------------------------------------------------------------------------------- Patient/Caregiver Education Details Patient Name: [name]. Date of Service: 09/02/2017 11:00 AM Medical Record Number: [id number] Patient Account Number: [id number] Date of Birth/Gender: 11-Jul-1934 (83 y.o. Female) Treating RN: Montey Hora Primary Care Physician: Loura Pardon Other Clinician: Referring Physician: Loura Pardon Treating Physician/Extender: Tito Dine in Treatment: 7 Education Assessment Education Provided To: Patient and Caregiver Education Topics Provided Wound/Skin Impairment: Handouts: Other: wound care as ordered Methods: Demonstration, Explain/Verbal Responses: State content correctly Electronic Signature(s) Signed: 09/02/2017 5:08:04 PM By: Montey Hora Entered By: Montey Hora on 09/02/2017 16:27:17 [name] ([id number]) -------------------------------------------------------------------------------- Wound Assessment Details Patient Name: [name]. Date of Service: 09/02/2017 11:00 AM Medical Record Number: [id number] Patient Account Number: [id number] Date of Birth/Sex: 08/19/1934 (83 y.o. Female) Treating RN: Montey Hora Primary Care Corina Stacy: Loura Pardon Other Clinician: Referring Avaline Stillson: Loura Pardon Treating Jacori Mulrooney/Extender: Tito Dine in Treatment: 7 Wound Status Wound Number: 5 Primary Abscess Etiology: Wound Location: Left Knee - Posterior Wound Status: Open Wounding Event: Gradually Appeared Comorbid Cataracts, Sleep Apnea, Arrhythmia, Date Acquired: 07/07/2017 History: Hypertension, Scleroderma Weeks Of Treatment: 7 Clustered Wound: No Photos Photo Uploaded By: Montey Hora on 09/02/2017 13:05:13 Wound Measurements Length: (cm) 0.2 Width: (cm) 0.2 Depth: (cm) 2 Area: (cm) 0.031 Volume: (cm) 0.063 % Reduction in Area: -29.2% % Reduction in Volume: -28.6% Epithelialization:  None Tunneling: No Undermining: No Wound Description Full Thickness Without Exposed Support Classification: Structures Wound Margin: Distinct, outline attached Exudate Large Amount: Exudate Type: Purulent Exudate Color: yellow, brown, green Foul Odor After Cleansing: No Slough/Fibrino Yes Wound Bed Granulation Amount: Large (67-100%) Granulation Quality: Red Necrotic Amount: None Present (0%) Periwound Skin Texture Texture Color No Abnormalities Noted: No No Abnormalities Noted: No LIGAYA, CORMIER ([id number]) Moisture Temperature / Pain No Abnormalities Noted: No Temperature: No Abnormality Tenderness on Palpation: Yes Wound Preparation Ulcer Cleansing: Rinsed/Irrigated with Saline Topical Anesthetic Applied: None Treatment Notes Wound #5 (Left, Posterior Knee) 1. Cleansed with: Clean wound with Normal Saline 4. Dressing Applied: Aquacel Ag 5. Secondary Dressing Applied Dry Gauze 7. Secured with Recruitment consultant) Signed: 09/02/2017 5:08:04 PM By: Montey Hora Entered By: Montey Hora on 09/02/2017 11:42:11 [name] ([id number]) -------------------------------------------------------------------------------- Vitals Details Patient Name: [name]. Date of Service: 09/02/2017 11:00 AM Medical Record Number: [id number] Patient Account Number: [id number] Date of Birth/Sex: [birth year] (83 y.o. Female) Treating RN: Montey Hora Primary Care Chriselda Leppert: Loura Pardon Other Clinician: Referring Harvy Riera: Loura Pardon Treating Fountain Derusha/Extender: Tito Dine in Treatment: 7 Vital Signs Time Taken: 11:35 Temperature (F): 97.5 Height (in): 66 Pulse (bpm): 61 Weight (lbs): 218.3 Respiratory Rate (breaths/min): 18 Body Mass Index (BMI): 35.2 Blood Pressure (mmHg): 125/59 Reference Range: 80 - 120 mg / dl Electronic Signature(s) Signed: 09/02/2017 5:08:04 PM By: Montey Hora Entered By: Montey Hora on 09/02/2017 11:36:29

## 2017-09-14 ENCOUNTER — Ambulatory Visit (INDEPENDENT_AMBULATORY_CARE_PROVIDER_SITE_OTHER): Payer: Medicare Other | Admitting: Psychology

## 2017-09-14 ENCOUNTER — Ambulatory Visit: Payer: Medicare Other | Admitting: Psychology

## 2017-09-14 DIAGNOSIS — F4321 Adjustment disorder with depressed mood: Secondary | ICD-10-CM

## 2017-09-28 ENCOUNTER — Ambulatory Visit (INDEPENDENT_AMBULATORY_CARE_PROVIDER_SITE_OTHER): Payer: Medicare Other | Admitting: Psychology

## 2017-09-28 DIAGNOSIS — F4321 Adjustment disorder with depressed mood: Secondary | ICD-10-CM | POA: Diagnosis not present

## 2017-09-30 ENCOUNTER — Encounter: Payer: Medicare Other | Attending: Internal Medicine | Admitting: Internal Medicine

## 2017-09-30 DIAGNOSIS — M71062 Abscess of bursa, left knee: Secondary | ICD-10-CM | POA: Insufficient documentation

## 2017-09-30 DIAGNOSIS — L02416 Cutaneous abscess of left lower limb: Secondary | ICD-10-CM | POA: Insufficient documentation

## 2017-09-30 DIAGNOSIS — B9561 Methicillin susceptible Staphylococcus aureus infection as the cause of diseases classified elsewhere: Secondary | ICD-10-CM | POA: Insufficient documentation

## 2017-09-30 DIAGNOSIS — L97928 Non-pressure chronic ulcer of unspecified part of left lower leg with other specified severity: Secondary | ICD-10-CM | POA: Insufficient documentation

## 2017-10-02 NOTE — Progress Notes (Signed)
[name], [name] ([id number]) Visit Report for 09/30/2017 HPI Details [name], [name]. Date of [name]: 09/30/2017 10:15 AM Medical Record Number: [id number] Patient Account Number: [id number] Date of Birth/Sex: [birth year] (83 y.o. Female) Treating RN: Montey Hora Primary Care Provider: Loura Pardon Other Clinician: Referring Provider: Loura Pardon Treating Provider/Extender: Tito Dine in Treatment: 11 History of Present Illness HPI Description: 10/28/16; this is an 83 year old woman who arrives with a complicated medical issue. 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 Initially in 2000 and apparently most recently in 2014. 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 Indeed she was hospitalized on 02/13/16 and discharged on 02/19/16. She was taken to the OR and it was found that the popliteal abscess was communicating with her joint space. Signed to feel fluid grew MSSA. Patient was treated with IandD of the abscess. She completed IV cefazolin on 03/28/16 and since then has been on Keflex and rifampin for chronic suppression. Her infectious disease doctor is Dr.Shrestha at [facility]. The patient was seen in our [place] sister clinic through much of October and November 2017. At that point she had a draining sinus that eventually closed over. She noted some pain behind her knee surrounding Christmas day and was seen in our emergency room on 10/23/16. During this ER visit she underwent tendon incision and drainage of a left Baker's cyst and clear colored synovial fluid was obtained. Doxycycline was added for a week to her Keflex and rifampin. A CT scan was ordered but I don't think was ever done due to artifacts from hardware. Her wound culture was negative. She has been using iodoform packing. She states her knee is painful when she stands on it for a period of time. She is not systemically unwell. She has a history of  scleroderma or without is been under control for some period of time. 11/04/16 the culture that I did of this area last week showed coag-negative staph and a few enterococcus faecium. Enterococcus is ampicillin resistant. The coag negative staph is only sensitive to vancomycin and tetracycline. Previous joint infection was methicillin sensitive staph aureus. It seems I misunderstood what she said and that her infectious disease clinic appointment is this Friday and the orthopedic appointment was last Friday. I faxed the culture results to the infectious disease clinic but I've given him a copy of it today. Per the patient and her family they orthopedic surgeon did not want to do anything further to this joint. 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 She went to see infectious disease at [facility] who apparently did did not recommend any further antibiotic therapy. She continues to feel the same episodic pain but no systemic symptoms of particular no fever. The depth of the wound in the popliteal fossa of her left knee is 3.5 versus 5 cm last week 11/25/16 4.5 cm of depth today. Draining exit site. We have treating this with iodoform packing however I really have no expectation of healing and to be truthful the amount of drainage that is coming out of this knee I don't think closure of this site is necessarily considered a positive outcome. Her left knee remains warm but not painful there is an effusion she has no systemic symptoms no fever no chills or and she feels well 12/31/16. Still is  same amount of depth today using a skinny at 4.6 cm. There is less drainage. We have been using iodoform packing and I really have no expectation of healing and to be truthful closing this sinus probably is really not the best thing to consider. The patient has an infected  artificial knee joint on the left. She is not felt to be a candidate for a fourth replacement.83 In the meantime she does not complain of pain except when the dressing is being changed episodically. She has not been systemically unwell no fever or chills or other issues. 01/14/17; after the patient left on 3/7 her daughter tells me the next day the wound had closed over and therefore this is not intact since. Over the last 3 days the patient has noted significant pain in the popliteal fossa of the involved left knee. 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 MSSA was the cause of the original identified joint infection treated at [facility]. 01/21/17; I saw this patient last week at which time there was intense erythema in the popliteal fossa, tenderness and a ballotable fluid collection. I did an IandD on this. Surprisingly the culture of this was negative although as a result of the IandD her tenderness and erythema in the area is resolved. We continue with the iodoform packing READMISSION 04/07/17; this is a patient I know from 2 prior admissions to this clinic starting in January of this year. Her history is essentially as noted above. 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 ([id number]) suppressive antibiotics. She has a draining sinus into the popliteal fossa of the left knee. This remains open and drains. She has been using iodoform packing to this area with gauze over the top. To be truthful I haven't really been certain whether having this area close would be helpful or harmful to this patient. Previously when it's closed she develops a lot more pain in the back of the popliteal fossa and more pain in the knee. She is here today because the packing of the area has become increasingly painful in fact the patient describes this as a "torture chamber"  experience. They are packing this wound twice a day. The patient follows with orthopedic surgery and infectious disease at [facility] although she hasn't seen them since she was last here in March. She denies any systemic symptoms including fever chills etc. She states except for the changing of the dressing her pain when she is mobilizing on the knee is 2/10. She also has a area on the left anterior patella in the incision that is excoriated open. The patient states this is itchy and she does a lot of scratching. They state this was here last time she was here but I have no recollection or documentation of this 04/21/17; patient was readmitted to clinic 2 weeks ago. She has a draining sinus in the left popliteal fossa and a chronically infected left total knee replacement. She also had an excoriation on the left anterior knee. She arrives in clinic today with all of this looking considerably better. The divot in the popliteal fossa is actually closed. READMISSION 07/15/17; the patient arrived today with a real opening of the draining sinus in her left popliteal fossa. This opened about a week ago after developing a painful "knot". Is been draining clear fluid. The patient's pain is a lot better. She is not  been systemically unwell. oShe also is been dealing with an area on the left anterior leg just below the tibial plateau. She also had this last time. Her daughter states that she scratches this area continuously. I have reviewed the last infectious disease note from August. 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 Much to my chagrin they seem reluctant to tap the joint. A culture of purulent material that came out of the popliteal fossa earlier this year cultured coag-negative staph aureus [not an uncommon cause of prosthetic joint infection]. However they really ignored this and they state that the source  was questionable. If they believe this I am wondering why they're so reluctant to tap the fluid. She has not seen orthopedics 07/22/17; patient's sinus tract in the popliteal fossa about 2.5 cm. The area on the anterior part of her knee looks a lot better. We have been using iodoform packing to the popliteal fossa, collagen to the anterior part of her knee. She is not experiencing undue pain or systemic symptoms 08/05/17; patient has a sinus tract in the popliteal fossa on the left. 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 She does not have any options for further replacements she is only been offered an above-knee amputation. 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 She has not been systemically unwell no fever. There is some pain and swelling in the knee but all in all this is not unbearable. They've been using iodoform packing 09/02/17; continued sinus tract on the popliteal fossa on the left as part of a chronic infection on a left total knee replacement. She follows with infectious disease at [facility] although the patient and her daughter are not exactly sure when her next appointment is. She has not been systemically unwell specifically denies fever chills. She has some pain in the left knee however she is reluctant to take even simple analgesics because of the risk of" addiction" 09/30/17; continued sinus tract in the popliteal fossa on the left as part of a chronic infection on the left total knee replacement. She has not been following recently with infectious disease at [facility]. She has some pain in the left knee although this is not major and not changed over the last recent months. Depth of the probing area is 1.9 cm Electronic Signature(s) Signed: 09/30/2017 5:29:04 PM By: Linton Ham MD Entered By: Linton Ham on 09/30/2017 12:28:40 [name]  ([id number]) -------------------------------------------------------------------------------- Physical Exam Details [name]. Date of [name]: 09/30/2017 10:15 AM Medical Record Number: [id number] Patient Account Number: [id number] Date of Birth/Sex: [birth year] (83 y.o. Female) Treating RN: Montey Hora Primary Care Provider: Loura Pardon Other Clinician: Referring Provider: Loura Pardon Treating Provider/Extender: Tito Dine in Treatment: 11 Constitutional Sitting or standing Blood Pressure is within target range for patient.. Pulse regular and within target range for patient.Marland Kitchen Respirations regular, non-labored and within target range.. Temperature is normal and within the target range for the patient.Marland Kitchen appears in no distress. Musculoskeletal the left knee joint itself still has some swelling and warmth but no major effusion. This is not different from what I'm used to seeing. Notes wound exam; the wound on the anterior left knee remains closed. The draining sinus is down in depth really a small open area. This would be difficult to pack at this point. There is no tenderness no palpable adenopathy and no  evidence of soft tissue infection Electronic Signature(s) Signed: 09/30/2017 5:29:04 PM By: Linton Ham MD Entered By: Linton Ham on 09/30/2017 12:31:13 [name] ([id number]) -------------------------------------------------------------------------------- Physician Orders Details [name]. Date of [name]: 09/30/2017 10:15 AM Medical Record Number: [id number] Patient Account Number: [id number] Date of Birth/Sex: 1934/07/20 (83 y.o. Female) Treating RN: Montey Hora Primary Care Provider: Loura Pardon Other Clinician: Referring Provider: Loura Pardon Treating Provider/Extender: Tito Dine in Treatment: 11 Verbal / Phone Orders: No Diagnosis Coding Wound Cleansing Wound #5 Left,Posterior Knee o  Clean wound with Normal Saline. o Cleanse wound with mild soap and water o May Shower, gently pat wound dry prior to applying new dressing. Anesthetic Wound #5 Left,Posterior Knee o Topical Lidocaine 4% cream applied to wound bed prior to debridement Skin Barriers/Peri-Wound Care Wound #5 Left,Posterior Knee o Skin Prep Primary Wound Dressing Wound #5 Left,Posterior Knee o Aquacel Ag Secondary Dressing Wound #5 Left,Posterior Knee o Dry Gauze o Boardered Foam Dressing o Other - coban to secure Dressing Change Frequency Wound #5 Left,Posterior Knee o Change dressing every day. Follow-up Appointments Wound #5 Left,Posterior Knee o Other: - as needed Edema Control Wound #5 Left,Posterior Knee o Elevate legs to the level of the heart and pump ankles as often as possible Additional Orders / Instructions Wound #5 Left,Posterior Knee o Increase protein intake. [name], [name] ([id number]) Electronic Signature(s) Signed: 09/30/2017 5:06:00 PM By: Montey Hora Signed: 09/30/2017 5:29:04 PM By: Linton Ham MD Entered By: Montey Hora on 09/30/2017 11:07:24 [name], [name] ([id number]) -------------------------------------------------------------------------------- Problem List Details [name], [name]. Date of [name]: 09/30/2017 10:15 AM Medical Record Number: [id number] Patient Account Number: [id number] Date of Birth/Sex: [birth year] (83 y.o. 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 Date of [name]: 09/30/2017 10:15 AM Medical Record Number: [id number] Patient Account Number: [id number] Date of Birth/Sex: 1933-11-13 (83 y.o. Female) Treating RN: Montey Hora Primary Care Provider: Loura Pardon Other Clinician: Referring Provider: Loura Pardon Treating Provider/Extender: Tito Dine in Treatment: 11 Subjective History of Present Illness (HPI) 10/28/16; this is an 83 year old woman who arrives with a complicated medical issue. 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 Initially in 2000 and apparently most recently in 2014. 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 Indeed she was hospitalized on 02/13/16 and discharged on 02/19/16. She was taken to the OR and it was found that the popliteal abscess was communicating with her joint space. Signed to feel fluid grew MSSA. Patient was treated with IandD of the abscess. She completed IV cefazolin on 03/28/16 and since then has been on Keflex and rifampin for chronic suppression. Her infectious disease doctor is Dr.Shrestha at [facility]. The patient was seen in our [place] sister clinic through much of October and November 2017. At that point she had a  draining sinus that eventually closed over. She noted some pain behind her knee surrounding Christmas day and was seen in our emergency room on 10/23/16. During this ER visit she underwent tendon incision and drainage of a left Baker's cyst and clear colored synovial fluid was  obtained. Doxycycline was added for a week to her Keflex and rifampin. A CT scan was ordered but I don't think was ever done due to artifacts from hardware. Her wound culture was negative. She has been using iodoform packing. She states her knee is painful when she stands on it for a period of time. She is not systemically unwell. She has a history of scleroderma or without is been under control for some period of time. 11/04/16 the culture that I did of this area last week showed coag-negative staph and a few enterococcus faecium. Enterococcus is ampicillin resistant. The coag negative staph is only sensitive to vancomycin and tetracycline. Previous joint infection was methicillin sensitive staph aureus. It seems I misunderstood what she said and that her infectious disease clinic appointment is this Friday and the orthopedic appointment was last Friday. I faxed the culture results to the infectious disease clinic but I've given him a copy of it today. Per the patient and her family they orthopedic surgeon did not want to do anything further to this joint. 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 She went to see infectious disease at [facility] who apparently did did not recommend any further antibiotic therapy. She continues to feel the same episodic pain but no systemic symptoms of particular no fever. The depth of the wound in the popliteal fossa of her left knee is 3.5 versus 5 cm last week 11/25/16 4.5 cm of depth today. Draining exit site. We have treating this with iodoform packing however I really have no expectation of healing and to be truthful the amount of drainage that is coming out of this knee I don't think closure of this site is necessarily considered a positive outcome. Her left knee remains warm but not painful there is an effusion she has  no systemic symptoms no fever no chills or and she feels well 12/31/16. Still is same amount of depth today using a skinny at 4.6 cm. There is less drainage. We have been using iodoform packing and I really have no expectation of healing and to be truthful closing this sinus probably is really not the best thing to consider. 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 She is not felt to be a candidate for a fourth replacement.83 In the meantime she does not complain of pain except when the dressing is being changed episodically. She has not been systemically unwell no fever or chills or other issues. 01/14/17; after the patient left on 3/7 her daughter tells me the next day the wound had closed over and therefore this is not intact since. Over the last 3 days the patient has noted significant pain in the popliteal fossa of the involved left knee. 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 MSSA was the cause of the original identified joint infection treated at [facility]. 01/21/17; I saw this patient last week at which time there was intense erythema in the popliteal fossa, tenderness and a ballotable fluid collection. I did  an IandD on this. Surprisingly the culture of this was negative although as a result of the IandD her tenderness and erythema in the area is resolved. We continue with the iodoform packing READMISSION 04/07/17; this is a patient I know from 2 prior admissions to this clinic starting in January of this year. Her history is essentially as noted above. 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 She has a draining sinus into the popliteal fossa of the left knee. This remains open and drains. She has been using iodoform packing to this area with gauze over the top. To be truthful I haven't really been certain  whether [name], [name]. ([id number]) having this area close would be helpful or harmful to this patient. Previously when it's closed she develops a lot more pain in the back of the popliteal fossa and more pain in the knee. She is here today because the packing of the area has become increasingly painful in fact the patient describes this as a "torture chamber" experience. They are packing this wound twice a day. The patient follows with orthopedic surgery and infectious disease at [name] although she hasn't seen them since she was last here in March. She denies any systemic symptoms including fever chills etc. She states except for the changing of the dressing her pain when she is mobilizing on the knee is 2/10. She also has a area on the left anterior patella in the incision that is excoriated open. The patient states this is itchy and she does a lot of scratching. They state this was here last time she was here but I have no recollection or documentation of this 04/21/17; patient was readmitted to clinic 2 weeks ago. She has a draining sinus in the left popliteal fossa and a chronically infected left total knee replacement. She also had an excoriation on the left anterior knee. She arrives in clinic today with all of this looking considerably better. The divot in the popliteal fossa is actually closed. READMISSION 07/15/17; the patient arrived today with a real opening of the draining sinus in her left popliteal fossa. This opened about a week ago after developing a painful "knot". Is been draining clear fluid. The patient's pain is a lot better. She is not been systemically unwell. She also is been dealing with an area on the left anterior leg just below the tibial plateau. She also had this last time. Her daughter states that she scratches this area continuously. I have reviewed the last infectious disease note from August. Notable for the fact that she is on chronic suppressive cephalexin  for MSSA septic arthritis and an artificial knee joint. Much to my chagrin they seem reluctant to tap the joint. A culture of purulent material that came out of the popliteal fossa earlier this year cultured coag-negative staph aureus [not an uncommon cause of prosthetic joint infection]. However they really ignored this and they state that the source was questionable. If they believe this I am wondering why they're so reluctant to tap the fluid. She has not seen orthopedics 07/22/17; patient's sinus tract in the popliteal fossa about 2.5 cm. The area on the anterior part of her knee looks a lot better. We have been using iodoform packing to the popliteal fossa, collagen to the anterior part of her knee. She is not experiencing undue pain or systemic symptoms 08/05/17; patient has a sinus tract in the popliteal fossa on the left. This  is part of a chronic infection and a left total knee replacement. She does not have any options for further replacements she is only been offered an above-knee amputation. 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 She has not been systemically unwell no fever. There is some pain and swelling in the knee but all in all this is not unbearable. They've been using iodoform packing 09/02/17; continued sinus tract on the popliteal fossa on the left as part of a chronic infection on a left total knee replacement. She follows with infectious disease at [facility] although the patient and her daughter are not exactly sure when her next appointment is. She has not been systemically unwell specifically denies fever chills. She has some pain in the left knee however she is reluctant to take even simple analgesics because of the risk of" addiction" 09/30/17; continued sinus tract in the popliteal fossa on the left as part of a chronic infection on the left total knee replacement. She has not been following recently with infectious disease at [facility]. She has  some pain in the left knee although this is not major and not changed over the last recent months. Depth of the probing area is 1.9 cm Objective Constitutional Sitting or standing Blood Pressure is within target range for patient.. Pulse regular and within target range for patient.Marland Kitchen Respirations regular, non-labored and within target range.. Temperature is normal and within the target range for the patient.Marland Kitchen appears in no distress. Vitals Time Taken: 10:29 AM, Height: 66 in, Weight: 218.3 lbs, BMI: 35.2, Pulse: 53 bpm, Respiratory Rate: 18 breaths/min, Blood Pressure: 131/71 mmHg. [name], [name]. ([id number]) Musculoskeletal the left knee joint itself still has some swelling and warmth but no major effusion. This is not different from what I'm used to seeing. General Notes: wound exam; the wound on the anterior left knee remains closed. The draining sinus is down in depth really a small open area. This would be difficult to pack at this point. There is no tenderness no palpable adenopathy and no evidence of soft tissue infection Integumentary (Hair, Skin) Wound #5 status is Open. Original cause of wound was Gradually Appeared. The wound is located on the Left,Posterior Knee. The wound measures 0.2cm length x 0.2cm width x 2cm depth; 0.031cm^2 area and 0.063cm^3 volume. There is no tunneling or undermining noted. There is a large amount of purulent drainage noted. The wound margin is distinct with the outline attached to the wound base. There is large (67-100%) red granulation within the wound bed. There is no necrotic tissue within the wound bed. The periwound skin appearance did not exhibit: Callus, Crepitus, Excoriation, Induration, Rash, Scarring, Dry/Scaly, Maceration, Atrophie Blanche, Cyanosis, Ecchymosis, Hemosiderin Staining, Mottled, Pallor, Rubor, Erythema. Periwound temperature was noted as No Abnormality. The periwound has tenderness on palpation. 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 Cleanse wound with mild soap and water May Shower, gently pat wound dry prior to applying new dressing. Anesthetic: Wound #5 Left,Posterior Knee: Topical Lidocaine 4% cream applied to wound bed prior to debridement Skin Barriers/Peri-Wound Care: Wound #5 Left,Posterior Knee: Skin Prep Primary Wound Dressing: Wound #5 Left,Posterior Knee: Aquacel Ag Secondary Dressing: Wound #5  Left,Posterior Knee: Dry Gauze Boardered Foam Dressing Other - coban to secure [name], [name]. ([id number]) Dressing Change Frequency: Wound #5 Left,Posterior Knee: Change dressing every day. Follow-up Appointments: Wound #5 Left,Posterior Knee: Other: - as needed Edema Control: Wound #5 Left,Posterior Knee: Elevate legs to the level of the heart and pump ankles as often as possible Additional Orders / Instructions: Wound #5 Left,Posterior Knee: Increase protein intake. #1continue Aquacel Ag packing and gauze to cover #2 I've advised her daughter that if this becomes too small to pack just to put gauze on top of this #3 in the past when this area has actually closed over it would appear that the drainage which looks like sinus revealed fluid has no place to go creating a painful bulge and swelling in the posterior left popliteal fossa. I think would be better to actually have this left open #4 there is no good reason to follow her in this clinic at this point in time, they're to call us should they need our assistance Electronic Signature(s) Signed: 09/30/2017 5:29:04 PM By: Linton Ham MD Entered By:  Linton Ham on 09/30/2017 12:34:26 [name] ([id number]) -------------------------------------------------------------------------------- SuperBill Details [name]. Date of [name]: 09/30/2017 Medical Record Number: [id number] Patient Account Number: [id number] Date of Birth/Sex: Mar 11, 1934 (83 y.o. 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

## 2017-10-02 NOTE — Progress Notes (Addendum)
KENNIYA, WESTRICH ([id number]) Visit Report for 09/30/2017 Arrival Information Details Patient Name: PARKER, SAWATZKY. Date of Service: 09/30/2017 10:15 AM Medical Record Number: [id number] Patient Account Number: [id number] Date of Birth/Sex: [birth year] (83 y.o. Female) Treating RN: Montey Hora Primary Care Keyah Blizard: Loura Pardon Other Clinician: Referring Retia Cordle: Loura Pardon Treating Analysa Nutting/Extender: Tito Dine in Treatment: 11 Visit Information History Since Last Visit Added or deleted any medications: No Patient Arrived: Walker Any new allergies or adverse reactions: No Arrival Time: 10:27 Had a fall or experienced change in No Accompanied By: dtr activities of daily living that may affect Transfer Assistance: None risk of falls: Patient Identification Verified: Yes Signs or symptoms of abuse/neglect since last visito No Secondary Verification Process Completed: Yes Hospitalized since last visit: No Patient Requires Transmission-Based Precautions: No Has Dressing in Place as Prescribed: Yes Patient Has Alerts: No Pain Present Now: No Electronic Signature(s) Signed: 09/30/2017 5:06:00 PM By: Montey Hora Entered By: Montey Hora on 09/30/2017 10:29:42 [name] ([id number]) -------------------------------------------------------------------------------- Clinic Level of Care Assessment Details Patient Name: [name]. Date of Service: 09/30/2017 10:15 AM Medical Record Number: [id number] Patient Account Number: [id number] Date of Birth/Sex: Sep 16, 1934 (83 y.o. Female) Treating RN: Montey Hora Primary Care Fatmata Legere: Loura Pardon Other Clinician: Referring Tynan Boesel: Loura Pardon Treating Saveah Bahar/Extender: Tito Dine in Treatment: 11 Clinic Level of Care Assessment Items TOOL 4 Quantity Score []  - Use when only an EandM is performed on FOLLOW-UP visit 0 ASSESSMENTS - Nursing Assessment / Reassessment X - Reassessment of  Co-morbidities (includes updates in patient status) 1 10 X- 1 5 Reassessment of Adherence to Treatment Plan ASSESSMENTS - Wound and Skin Assessment / Reassessment X - Simple Wound Assessment / Reassessment - one wound 1 5 []  - 0 Complex Wound Assessment / Reassessment - multiple wounds []  - 0 Dermatologic / Skin Assessment (not related to wound area) ASSESSMENTS - Focused Assessment []  - Circumferential Edema Measurements - multi extremities 0 []  - 0 Nutritional Assessment / Counseling / Intervention X- 1 5 Lower Extremity Assessment (monofilament, tuning fork, pulses) []  - 0 Peripheral Arterial Disease Assessment (using hand held doppler) ASSESSMENTS - Ostomy and/or Continence Assessment and Care []  - Incontinence Assessment and Management 0 []  - 0 Ostomy Care Assessment and Management (repouching, etc.) PROCESS - Coordination of Care X - Simple Patient / Family Education for ongoing care 1 15 []  - 0 Complex (extensive) Patient / Family Education for ongoing care []  - 0 Staff obtains Programmer, systems, Records, Test Results / Process Orders []  - 0 Staff telephones HHA, Nursing Homes / Clarify orders / etc []  - 0 Routine Transfer to another Facility (non-emergent condition) []  - 0 Routine Hospital Admission (non-emergent condition) []  - 0 New Admissions / Biomedical engineer / Ordering NPWT, Apligraf, etc. []  - 0 Emergency Hospital Admission (emergent condition) X- 1 10 Simple Discharge Coordination MAYVIS, AGUDELO ([id number]) []  - 0 Complex (extensive) Discharge Coordination PROCESS - Special Needs []  - Pediatric / Minor Patient Management 0 []  - 0 Isolation Patient Management []  - 0 Hearing / Language / Visual special needs []  - 0 Assessment of Community assistance (transportation, D/C planning, etc.) []  - 0 Additional assistance / Altered mentation []  - 0 Support Surface(s) Assessment (bed, cushion, seat, etc.) INTERVENTIONS - Wound Cleansing / Measurement X -  Simple Wound Cleansing - one wound 1 5 []  - 0 Complex Wound Cleansing - multiple wounds X- 1 5 Wound Imaging (photographs - any number of wounds) []  - 0 Wound  Tracing (instead of photographs) X- 1 5 Simple Wound Measurement - one wound []  - 0 Complex Wound Measurement - multiple wounds INTERVENTIONS - Wound Dressings X - Small Wound Dressing one or multiple wounds 1 10 []  - 0 Medium Wound Dressing one or multiple wounds []  - 0 Large Wound Dressing one or multiple wounds []  - 0 Application of Medications - topical []  - 0 Application of Medications - injection INTERVENTIONS - Miscellaneous []  - External ear exam 0 []  - 0 Specimen Collection (cultures, biopsies, blood, body fluids, etc.) []  - 0 Specimen(s) / Culture(s) sent or taken to Lab for analysis []  - 0 Patient Transfer (multiple staff / Civil Service fast streamer / Similar devices) []  - 0 Simple Staple / Suture removal (25 or less) []  - 0 Complex Staple / Suture removal (26 or more) []  - 0 Hypo / Hyperglycemic Management (close monitor of Blood Glucose) []  - 0 Ankle / Brachial Index (ABI) - do not check if billed separately X- 1 5 Vital Signs Lackman, LAPORSCHA LINEHAN ([id number]) Has the patient been seen at the hospital within the last three years: Yes Total Score: 80 Level Of Care: New/Established - Level 3 Electronic Signature(s) Signed: 09/30/2017 5:06:00 PM By: Montey Hora Entered By: Montey Hora on 09/30/2017 15:50:40 [name] ([id number]) -------------------------------------------------------------------------------- Encounter Discharge Information Details Patient Name: [name]. Date of Service: 09/30/2017 10:15 AM Medical Record Number: [id number] Patient Account Number: [id number] Date of Birth/Sex: 1934/02/17 (83 y.o. Female) Treating RN: Montey Hora Primary Care Myan Locatelli: Loura Pardon Other Clinician: Referring Zoii Florer: Loura Pardon Treating Ellia Knowlton/Extender: Tito Dine in Treatment:  11 Encounter Discharge Information Items Discharge Pain Level: 0 Discharge Condition: Stable Ambulatory Status: Walker Discharge Destination: Home Transportation: Private Auto Accompanied By: self Schedule Follow-up Appointment: No Medication Reconciliation completed and No provided to Patient/Care Patsye Sullivant: Provided on Clinical Summary of Care: 09/30/2017 Form Type Recipient Paper Patient FW Notes follow up as needed Electronic Signature(s) Signed: 09/30/2017 5:06:00 PM By: Montey Hora Entered By: Montey Hora on 09/30/2017 15:51:48 [name] ([id number]) -------------------------------------------------------------------------------- Lower Extremity Assessment Details Patient Name: [name]. Date of Service: 09/30/2017 10:15 AM Medical Record Number: [id number] Patient Account Number: [id number] Date of Birth/Sex: 1934/01/20 (83 y.o. Female) Treating RN: Montey Hora Primary Care Suleiman Finigan: Loura Pardon Other Clinician: Referring Asmara Backs: Loura Pardon Treating Natascha Edmonds/Extender: Tito Dine in Treatment: 11 Vascular Assessment Pulses: Dorsalis Pedis Palpable: [Left:Yes] Posterior Tibial Extremity colors, hair growth, and conditions: Extremity Color: [Left:Normal] Hair Growth on Extremity: [Left:Yes] Temperature of Extremity: [Left:Warm] Capillary Refill: [Left:< 3 seconds] Electronic Signature(s) Signed: 09/30/2017 5:06:00 PM By: Montey Hora Entered By: Montey Hora on 09/30/2017 10:36:56 Weinreb, Blima Singer ([id number]) -------------------------------------------------------------------------------- Multi Wound Chart Details Patient Name: [name]. Date of Service: 09/30/2017 10:15 AM Medical Record Number: [id number] Patient Account Number: [id number] Date of Birth/Sex: May 18, 1934 (83 y.o. Female) Treating RN: Montey Hora Primary Care Darrold Bezek: Loura Pardon Other Clinician: Referring Shaden Higley: Loura Pardon Treating  Raymonde Hamblin/Extender: Tito Dine in Treatment: 11 Vital Signs Height(in): 66 Pulse(bpm): 53 Weight(lbs): 218.3 Blood Pressure(mmHg): 131/71 Body Mass Index(BMI): 35 Temperature(F): Respiratory Rate 18 (breaths/min): Photos: [5:No Photos] [N/A:N/A] Wound Location: [5:Left Knee - Posterior] [N/A:N/A] Wounding Event: [5:Gradually Appeared] [N/A:N/A] Primary Etiology: [5:Abscess] [N/A:N/A] Comorbid History: [5:Cataracts, Sleep Apnea, Arrhythmia, Hypertension, Scleroderma] [N/A:N/A] Date Acquired: [5:07/07/2017] [N/A:N/A] Weeks of Treatment: [5:11] [N/A:N/A] Wound Status: [5:Open] [N/A:N/A] Measurements L x W x D [5:0.2x0.2x2] [N/A:N/A] (cm) Area (cm) : [5:0.031] [N/A:N/A] Volume (cm) : [5:0.063] [N/A:N/A] % Reduction in Area: [5:-29.20%] [  N/A:N/A] % Reduction in Volume: [5:-28.60%] [N/A:N/A] Classification: [5:Full Thickness Without Exposed Support Structures] [N/A:N/A] Exudate Amount: [5:Large] [N/A:N/A] Exudate Type: [5:Purulent] [N/A:N/A] Exudate Color: [5:yellow, brown, green] [N/A:N/A] Wound Margin: [5:Distinct, outline attached] [N/A:N/A] Granulation Amount: [5:Large (67-100%)] [N/A:N/A] Granulation Quality: [5:Red] [N/A:N/A] Necrotic Amount: [5:None Present (0%)] [N/A:N/A] Exposed Structures: [5:Fascia: No Fat Layer (Subcutaneous Tissue) Exposed: No Tendon: No Muscle: No Joint: No Bone: No] [N/A:N/A] Epithelialization: [5:None] [N/A:N/A] Periwound Skin Texture: [5:Excoriation: No Induration: No Callus: No Crepitus: No] [N/A:N/A] Rash: No Scarring: No Periwound Skin Moisture: Maceration: No N/A N/A Dry/Scaly: No Periwound Skin Color: Atrophie Blanche: No N/A N/A Cyanosis: No Ecchymosis: No Erythema: No Hemosiderin Staining: No Mottled: No Pallor: No Rubor: No Temperature: No Abnormality N/A N/A Tenderness on Palpation: Yes N/A N/A Wound Preparation: Ulcer Cleansing: N/A N/A Rinsed/Irrigated with Saline Topical Anesthetic  Applied: None Treatment Notes Electronic Signature(s) Signed: 09/30/2017 5:29:04 PM By: Linton Ham MD Entered By: Linton Ham on 09/30/2017 12:27:37 [name] ([id number]) -------------------------------------------------------------------------------- [place] Details Patient Name: [name]. Date of Service: 09/30/2017 10:15 AM Medical Record Number: [id number] Patient Account Number: [id number] Date of Birth/Sex: [birth year] (83 y.o. Female) Treating RN: Montey Hora Primary Care Johnda Billiot: Loura Pardon Other Clinician: Referring Rielly Corlett: Loura Pardon Treating Ruperto Kiernan/Extender: Tito Dine in Treatment: 11 Active Inactive Electronic Signature(s) Signed: 11/30/2017 4:06:54 PM By: Gretta Cool, BSN, RN, CWS, Kim RN, BSN Signed: 11/30/2017 4:23:35 PM By: Montey Hora Previous Signature: 09/30/2017 5:06:00 PM Version By: Montey Hora Entered By: Gretta Cool BSN, RN, CWS, Kim on 11/30/2017 16:06:53 [name] ([id number]) -------------------------------------------------------------------------------- Pain Assessment Details Patient Name: SYMIA, HERDT. Date of Service: 09/30/2017 10:15 AM Medical Record Number: [id number] Patient Account Number: [id number] Date of Birth/Sex: 1934-05-19 (83 y.o. Female) Treating RN: Montey Hora Primary Care Montgomery Rothlisberger: Loura Pardon Other Clinician: Referring Khanh Tanori: Loura Pardon Treating Allessandra Bernardi/Extender: Tito Dine in Treatment: 11 Active Problems Location of Pain Severity and Description of Pain Patient Has Paino No Site Locations Pain Management and Medication Current Pain Management: Notes Topical or injectable lidocaine is offered to patient for acute pain when surgical debridement is performed. If needed, Patient is instructed to use over the counter pain medication for the following 24-48 hours after debridement. Wound care MDs do not prescribed pain medications. Patient has  chronic pain or uncontrolled pain. Patient has been instructed to make an appointment with their Primary Care Physician for pain management. Electronic Signature(s) Signed: 09/30/2017 5:06:00 PM By: Montey Hora Entered By: Montey Hora on 09/30/2017 10:29:51 [name] ([id number]) -------------------------------------------------------------------------------- Patient/Caregiver Education Details Patient Name: [name]. Date of Service: 09/30/2017 10:15 AM Medical Record Number: [id number] Patient Account Number: [id number] Date of Birth/Gender: July 12, 1934 (83 y.o. Female) Treating RN: Montey Hora Primary Care Physician: Loura Pardon Other Clinician: Referring Physician: Loura Pardon Treating Physician/Extender: Tito Dine in Treatment: 11 Education Assessment Education Provided To: Patient and Caregiver Education Topics Provided Wound/Skin Impairment: Handouts: Other: wound care as ordered Methods: Demonstration, Explain/Verbal Responses: State content correctly Electronic Signature(s) Signed: 09/30/2017 5:06:00 PM By: Montey Hora Entered By: Montey Hora on 09/30/2017 15:52:02 [name] ([id number]) -------------------------------------------------------------------------------- Wound Assessment Details Patient Name: [name]. Date of Service: 09/30/2017 10:15 AM Medical Record Number: [id number] Patient Account Number: [id number] Date of Birth/Sex: 1934-03-18 (83 y.o. Female) Treating RN: Montey Hora Primary Care Marbin Olshefski: Loura Pardon Other Clinician: Referring Deloros Beretta: Loura Pardon Treating Elasia Furnish/Extender: Ricard Dillon Weeks in Treatment: 11 Wound Status Wound Number: 5 Primary Abscess Etiology: Wound Location: Left Knee -  Posterior Wound Status: Open Wounding Event: Gradually Appeared Comorbid Cataracts, Sleep Apnea, Arrhythmia, Date Acquired: 07/07/2017 History: Hypertension, Scleroderma Weeks Of Treatment:  11 Clustered Wound: No Photos Photo Uploaded By: Montey Hora on 09/30/2017 16:24:40 Wound Measurements Length: (cm) 0.2 Width: (cm) 0.2 Depth: (cm) 2 Area: (cm) 0.031 Volume: (cm) 0.063 % Reduction in Area: -29.2% % Reduction in Volume: -28.6% Epithelialization: None Tunneling: No Undermining: No Wound Description Full Thickness Without Exposed Support Classification: Structures Wound Margin: Distinct, outline attached Exudate Large Amount: Exudate Type: Purulent Exudate Color: yellow, brown, green Foul Odor After Cleansing: No Slough/Fibrino Yes Wound Bed Granulation Amount: Large (67-100%) Exposed Structure Granulation Quality: Red Fascia Exposed: No Necrotic Amount: None Present (0%) Fat Layer (Subcutaneous Tissue) Exposed: No Tendon Exposed: No Muscle Exposed: No Joint Exposed: No Bone Exposed: No Beighley, Blima Singer ([id number]) Periwound Skin Texture Texture Color No Abnormalities Noted: No No Abnormalities Noted: No Callus: No Atrophie Blanche: No Crepitus: No Cyanosis: No Excoriation: No Ecchymosis: No Induration: No Erythema: No Rash: No Hemosiderin Staining: No Scarring: No Mottled: No Pallor: No Moisture Rubor: No No Abnormalities Noted: No Dry / Scaly: No Temperature / Pain Maceration: No Temperature: No Abnormality Tenderness on Palpation: Yes Wound Preparation Ulcer Cleansing: Rinsed/Irrigated with Saline Topical Anesthetic Applied: None Treatment Notes Wound #5 (Left, Posterior Knee) 1. Cleansed with: Clean wound with Normal Saline 2. Anesthetic Topical Lidocaine 4% cream to wound bed prior to debridement 4. Dressing Applied: Other dressing (specify in notes) 5. Secondary Dressing Applied Dry Gauze 7. Secured with Tape Notes silvercel Electronic Signature(s) Signed: 09/30/2017 5:06:00 PM By: Montey Hora Entered By: Montey Hora on 09/30/2017 10:36:42 [name]  ([id number]) -------------------------------------------------------------------------------- Vitals Details Patient Name: [name]. Date of Service: 09/30/2017 10:15 AM Medical Record Number: [id number] Patient Account Number: [id number] Date of Birth/Sex: [birth year] (83 y.o. Female) Treating RN: Montey Hora Primary Care Dolce Sylvia: Loura Pardon Other Clinician: Referring Edman Lipsey: Loura Pardon Treating Ruthella Kirchman/Extender: Tito Dine in Treatment: 11 Vital Signs Time Taken: 10:29 Pulse (bpm): 53 Height (in): 66 Respiratory Rate (breaths/min): 18 Weight (lbs): 218.3 Blood Pressure (mmHg): 131/71 Body Mass Index (BMI): 35.2 Reference Range: 80 - 120 mg / dl Electronic Signature(s) Signed: 09/30/2017 5:06:00 PM By: Montey Hora Entered By: Montey Hora on 09/30/2017 10:32:39

## 2017-10-05 ENCOUNTER — Ambulatory Visit: Payer: Self-pay | Admitting: Family Medicine

## 2017-10-08 ENCOUNTER — Telehealth: Payer: Self-pay | Admitting: Gastroenterology

## 2017-10-08 NOTE — Telephone Encounter (Signed)
Informed pts daughter that the rx was in my inbox for refill.  She said her mother is not experiencing diarrhea anymore.  But she will hold on to the medication in case it returns.  Thanks [employer]

## 2017-10-08 NOTE — Telephone Encounter (Signed)
Patient left a voice message that she got a RX for Xifaxan and was wondering why? Please call

## 2017-10-19 ENCOUNTER — Ambulatory Visit: Payer: Self-pay | Admitting: Family Medicine

## 2017-10-19 ENCOUNTER — Ambulatory Visit (INDEPENDENT_AMBULATORY_CARE_PROVIDER_SITE_OTHER): Payer: Medicare Other | Admitting: Family Medicine

## 2017-10-19 ENCOUNTER — Encounter: Payer: Self-pay | Admitting: Family Medicine

## 2017-10-19 VITALS — BP 124/78 | HR 57 | Temp 97.4°F | Ht 65.0 in | Wt 215.0 lb

## 2017-10-19 DIAGNOSIS — M349 Systemic sclerosis, unspecified: Secondary | ICD-10-CM | POA: Diagnosis not present

## 2017-10-19 DIAGNOSIS — J849 Interstitial pulmonary disease, unspecified: Secondary | ICD-10-CM

## 2017-10-19 DIAGNOSIS — I10 Essential (primary) hypertension: Secondary | ICD-10-CM | POA: Diagnosis not present

## 2017-10-19 DIAGNOSIS — E538 Deficiency of other specified B group vitamins: Secondary | ICD-10-CM

## 2017-10-19 DIAGNOSIS — R413 Other amnesia: Secondary | ICD-10-CM | POA: Diagnosis not present

## 2017-10-19 DIAGNOSIS — I471 Supraventricular tachycardia, unspecified: Secondary | ICD-10-CM

## 2017-10-19 DIAGNOSIS — I272 Pulmonary hypertension, unspecified: Secondary | ICD-10-CM

## 2017-10-19 DIAGNOSIS — F430 Acute stress reaction: Secondary | ICD-10-CM

## 2017-10-19 DIAGNOSIS — R197 Diarrhea, unspecified: Secondary | ICD-10-CM

## 2017-10-19 DIAGNOSIS — E039 Hypothyroidism, unspecified: Secondary | ICD-10-CM

## 2017-10-19 MED ORDER — FLUOXETINE HCL 20 MG PO TABS: 20.0000 mg | ORAL_TABLET | Freq: Every day | ORAL | 11 refills | Status: DC

## 2017-10-19 MED ORDER — LEVOTHYROXINE SODIUM 25 MCG PO TABS: 25.0000 ug | ORAL_TABLET | Freq: Every day | ORAL | 11 refills | Status: DC

## 2017-10-19 NOTE — Patient Instructions (Addendum)
If you are still having the diarrhea -start back on the xifaxan  If no improvement - make appt with GI   Start levothyroxine 25 mcg once daily in am 30 to 60 minutes before other medicines or eating   We will check thyroid numbers in 6 weeks (then we will change dose only if we need to)   Get back on vitamin B 12 over the counter every day!  We will check B12 level when we check thyroid in 6 weeks   Since you cannot do much with your leg - start working on upper body activity in a chair (use your weights)   Increase your fluoxetine (prozac) to 20 mg once daily   - you can take 2 of the pills you have now until you run out and the new px will be for 20 mg

## 2017-10-19 NOTE — Progress Notes (Signed)
Subjective:    Patient ID: [name], female    DOB: 1934-08-28, 83 y.o.   MRN: [id number]  HPI Here for f/u of multiple medical problems   Wt Readings from Last 3 Encounters:  10/19/17 215 lb (97.5 kg)  08/14/17 217 lb 12.8 oz (98.8 kg)  08/07/17 215 lb 9.6 oz (97.8 kg)   35.78 kg/m   Since last visit  Saw GI for chronic diarrhea  diag with bacterial overgrowth 2nd to chronic abx use (for knee)  Responded well to rifaximin and had resolution of symptoms  Per pt know it is sporatic /comes and goes   Thyroid Lab Results  Component Value Date   TSH 5.21 (H) 06/30/2017   is open to to starting the levothyroxine    Planned to tx this once diarrhea was better  B12 Lab Results  Component Value Date   VITAMINB12 237 06/30/2017  given B12 shot  Told to take 1000 mcg of B12 daily   bp is stable today  No cp or palpitations or headaches or edema  No side effects to medicines  BP Readings from Last 3 Encounters:  10/19/17 124/78  08/14/17 125/80  08/07/17 116/72     Stress reaction  Was ref to counseling  tx with prozac Some improvement  She is less tearful  Episodes of sadness but they do not last as long Not anxious  Would like to go up to 20 mg dose now    Memory issues thought to be multifactorial from above  Not a lot of change in that  Still has wit/sense of humor   Also got hearing aides - very happy with them   Patient Active Problem List   Diagnosis Date Noted  . Hypothyroid 07/03/2017  . B12 deficiency 07/03/2017  . Encounter for audiology evaluation 07/03/2017  . Encounter for screening mammogram for breast cancer 07/03/2017  . Tearfulness 06/28/2017  . Memory loss 06/28/2017  . Poor balance 09/26/2016  . Neck pain 07/30/2016  . Diarrhea 07/30/2016  . Routine general medical examination at a health care facility 06/26/2016  . Elevated TSH 06/25/2016  . Stress reaction 06/04/2016  . Intertrigo 06/04/2016  . Chronic cough 05/01/2016  .  Constipation 01/09/2016  . Umbilical hernia 47/82/9562  . Chronic respiratory failure ([facility]) 10/05/2015  . Thromboembolism of vein 10/02/2015  . Infection of prosthetic joint ([place]) 09/18/2015  . Infection of prosthetic left knee joint ([place]) 09/12/2015  . Effusion of knee 08/30/2015  . Pulmonary hypertension ([place])   . Physical deconditioning 02/25/2015  . RBBB-new 02/12/2015  . History of bilat PE after TKR (off Coumadin Dec 2015) 02/12/2015  . Abnormal EKG-TWI, RBBB, prolonged QT   . Hernia, umbilical 13/05/6577  . Hx of colonic polyps 10/24/2014  . Diverticulosis of colon without hemorrhage   . OSA (obstructive sleep apnea) 07/22/2014  . Unspecified arthropathy, lower leg 03/21/2014  . Status post surgery 03/15/2014  . Arthritis of knee 03/09/2014  . Solitary pulmonary nodule 05/03/2013  . Sternal fracture 04/26/2013  . PE (pulmonary embolism) 11/24/2012  . History of DVT (deep vein thrombosis) 11/24/2012  . History of artificial joint 10/29/2012  . 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 Hypertension 10/14/2012  . Complications due to internal joint prosthesis ([place]) 09/07/2012  . PES PLANUS 08/15/2010  . URINARY FREQUENCY, CHRONIC 07/14/2008  . Dyslipidemia 07/09/2007  . Essential hypertension 07/09/2007  . PSVT with SSS 02/23/2007  . SCLERODERMA 02/23/2007  . DEGENERATIVE JOINT DISEASE 02/23/2007  . SPINAL STENOSIS,  LUMBAR 02/23/2007  . History of osteopenia 02/23/2007  . SPONDYLOLISTHESIS 02/23/2007   Past Medical History:  Diagnosis Date  . Anemia   . Arthritis    knee  . Diverticulosis   . Hx of colonic polyps 10/24/2014  . Hyperlipidemia   . Hypertension   . Osteopenia   . Overactive bladder    urge incontinence  . PSVT (paroxysmal supraventricular tachycardia) ([place])   . Pulmonary emboli ([place])   . Pulmonary hypertension ([place])   . Right ventricular failure ([place])   . Sleep apnea    CPAP  . Spinal stenosis    Past Surgical History:  Procedure Laterality Date   . BLADDER SUSPENSION  1993  . CARDIAC CATHETERIZATION N/A 02/28/2015   Procedure: Right/Left Heart Cath and Coronary Angiography;  Surgeon: Troy Sine, MD;  Location: [facility] INVASIVE CV LAB CUPID;  Service: Cardiovascular;  Laterality: N/A;  . CATARACT EXTRACTION Bilateral 2008  . CHOLECYSTECTOMY    . COLONOSCOPY    . COLONOSCOPY N/A 10/24/2014   Procedure: COLONOSCOPY;  Surgeon: Gatha Mayer, MD;  Location: [facility] ENDOSCOPY;  Service: Endoscopy;  Laterality: N/A;  . CYST REMOVAL LEG Left 02/15/2016   cyst removal from back of left knee  . ELECTROPHYSIOLOGIC STUDY N/A 03/02/2015   Procedure: SVT Ablation;  Surgeon: Evans Lance, MD;  Location: [place] [facility] CV LAB;  Service: Cardiovascular;  Laterality: N/A;  . INCISIONAL HERNIA REPAIR    . RIGHT HEART CATHETERIZATION N/A 07/04/2014   Procedure: RIGHT HEART CATH;  Surgeon: Jolaine Artist, MD;  Location: [facility] CATH LAB;  Service: Cardiovascular;  Laterality: N/A;  . TONSILLECTOMY AND ADENOIDECTOMY    . TOTAL KNEE ARTHROPLASTY Left 2000  . TOTAL KNEE ARTHROPLASTY Left 10/29/2012   at [facility]  . TOTAL KNEE ARTHROPLASTY Right 03/09/2014  . TOTAL KNEE REVISION  01/12/2012   Procedure: TOTAL KNEE REVISION;  Surgeon: Kerin Salen, MD;  Location: [facility];  Service: Orthopedics;  Laterality: Left;  DEPUY/ LCS , HAND SET  . TRIGGER FINGER RELEASE  01/12/2012   Procedure: RELEASE TRIGGER FINGER/A-1 PULLEY;  Surgeon: Kerin Salen, MD;  Location: [facility];  Service: Orthopedics;  Laterality: Right;   Social History   Tobacco Use  . Smoking status: Never Smoker  . Smokeless tobacco: Never Used  Substance Use Topics  . Alcohol use: No    Alcohol/week: 0.0 oz  . Drug use: No   Family History  Problem Relation Age of Onset  . Breast cancer Mother 21  . Heart disease Father   . Diabetes Father   . Hypertension Father   . Uterine cancer Sister    Allergies  Allergen Reactions  . Prednisone Other (See Comments)    hyperctivity  . Zithromax [Azithromycin]  Other (See Comments)    Prolonged QT on EKG  . Penicillins Hives and Swelling    Questionable allergy (?) Currently on cefazolin, tolerating well since 4/21 Patient is tolerating dicloxacillin without issue. No skin changes or other concerns.   . Sulfonamide Derivatives Hives  . Adhesive [Tape] Other (See Comments) and Rash    redness   Current Outpatient Medications on File Prior to Visit  Medication Sig Dispense Refill  . benzonatate (TESSALON) 200 MG capsule TAKE ONE CAPSULE BY MOUTH TWICE DAILY ASNEEDED FOR COUGH (Patient taking differently: Take 200 mg by mouth daily. TAKE ONE CAPSULE BY MOUTH TWICE DAILY ASNEEDED FOR COUGH) 180 capsule 3  . Calcium Carb-Cholecalciferol (CALCIUM 600/VITAMIN D3) 600-800 MG-UNIT TABS Take 1 tablet by  mouth 2 (two) times daily.    . cephALEXin (KEFLEX) 500 MG capsule 500 mg 3 (three) times daily.     . Cholecalciferol (VITAMIN D-1000 MAX ST) 1000 UNITS tablet Take 1,000 Units by mouth daily.    Marland Kitchen FLUoxetine (PROZAC) 10 MG capsule Take 1 capsule (10 mg total) by mouth daily. 30 capsule 11  . hydrochlorothiazide (HYDRODIURIL) 25 MG tablet TAKE 1 TABLET DAILY 90 tablet 3  . metoprolol tartrate (LOPRESSOR) 25 MG tablet TAKE ONE-HALF (1/2) OF A TABLET BY MOUTHTWICE DAILY *PT DUE FOR APPT,MUST CALL TO SCHEDULE [phone]!* 15 tablet 0  . naproxen sodium (ANAPROX) 220 MG tablet Take 220 mg by mouth daily.     . OXYGEN Inhale 2 L into the lungs at bedtime.    Vladimir Faster Glycol-Propyl Glycol (SYSTANE) 0.4-0.3 % SOLN Apply 1 drop to eye 2 (two) times daily.     No current facility-administered medications on file prior to visit.     Review of Systems  Constitutional: Positive for fatigue. Negative for activity change, appetite change, fever and unexpected weight change.  HENT: Negative for congestion, ear pain, rhinorrhea, sinus pressure and sore throat.   Eyes: Negative for pain, redness and visual disturbance.  Respiratory: Positive for cough. Negative for  shortness of breath and wheezing.        Pos for chronic cough   Cardiovascular: Negative for chest pain and palpitations.  Gastrointestinal: Negative for abdominal pain, blood in stool, constipation and diarrhea.  Endocrine: Negative for polydipsia and polyuria.  Genitourinary: Negative for dysuria, frequency and urgency.  Musculoskeletal: Negative for arthralgias, back pain and myalgias.  Skin: Negative for pallor and rash.  Allergic/Immunologic: Negative for environmental allergies.  Neurological: Negative for dizziness, syncope and headaches.  Hematological: Negative for adenopathy. Does not bruise/bleed easily.  Psychiatric/Behavioral: Positive for decreased concentration and dysphoric mood. Negative for suicidal ideas. The patient is nervous/anxious.        Objective:   Physical Exam  Constitutional: She appears well-developed and well-nourished. No distress.  obese and well appearing   HENT:  Head: Normocephalic and atraumatic.  Mouth/Throat: Oropharynx is clear and moist.  Eyes: Conjunctivae and EOM are normal. Pupils are equal, round, and reactive to light.  Neck: Normal range of motion. Neck supple. No JVD present. Carotid bruit is not present. No thyromegaly present.  Cardiovascular: Normal rate, regular rhythm, normal heart sounds and intact distal pulses. Exam reveals no gallop.  Pulmonary/Chest: Effort normal and breath sounds normal. No respiratory distress. She has no wheezes. She has no rales.  No crackles  Abdominal: Soft. Bowel sounds are normal. She exhibits no distension, no abdominal bruit and no mass. There is no tenderness.  Musculoskeletal: She exhibits no edema.  Lymphadenopathy:    She has no cervical adenopathy.  Neurological: She is alert. She has normal reflexes.  Skin: Skin is warm and dry. No rash noted.  Psychiatric: Her mood appears anxious. Her affect is not blunt, not labile and not inappropriate. Thought content is not paranoid. She expresses no  homicidal and no suicidal ideation.  Improved affect/ brighter  Mildly anxious  Mentally sharp and attentive today          Assessment & Plan:   Problem List Items Addressed This Visit      Cardiovascular and Mediastinum   Essential hypertension    bp in fair control at this time  BP Readings from Last 1 Encounters:  10/19/17 124/78   No changes needed Disc lifstyle change with low  sodium diet and exercise        PSVT with SSS    No recent events      Pulmonary hypertension (HCC)    No clinical changes  Has appt with pulmonary in the spring  Chronic cough continues         Respiratory   ILD (interstitial lung disease) ([place])    No clinical change  For f/u with pulm in the spring         Endocrine   Hypothyroid    Lab Results  Component Value Date   TSH 5.21 (H) 06/30/2017   Will start levothyroxine 25 mcg daily in am before food or other medicines  Re check tsh in 6 wk  Hope this will help energy level/mood/cognitive status  F/u planned       Relevant Medications   levothyroxine (SYNTHROID, LEVOTHROID) 25 MCG tablet     Other   B12 deficiency    Pt had one shot after level  Lab Results  Component Value Date   VITAMINB12 237 06/30/2017    Was told to take otc 1000 mcg daily and she stopped Urged to re start it  Lab in 6 wk with tsh  F/u as planned  Hope this will help energy level /cognitive status       Diarrhea    Pt states this is still intermittent Rev GI notes-suspect bacterial overgrowth from her chronic abx for knee infection  Enc her to take the xifaxan as directed to keep this under control  If no further improvement she will f/u with GI      Memory loss    Working on B12 level/hypothyroidism and depression with anxiety  F/u planned after labs to further discuss once above are under control to see if we suspect underlying dementia       SCLERODERMA    Unsure if she has this No symptoms currently      Stress reaction -  Primary    Reviewed stressors/ coping techniques/symptoms/ support sources/ tx options and side effects in detail today Overall improving with fluoxetine 10- will inc to 20 mg daily  Discussed expectations of SSRI medication including time to effectiveness and mechanism of action, also poss of side effects (early and late)- including mental fuzziness, weight or appetite change, nausea and poss of worse dep or anxiety (even suicidal thoughts)  Pt voiced understanding and will stop med and update if this occurs   Disc imp of self care/nutrition  Will attempt to get upper body exercise since she is limited by chronic knee infection       Relevant Medications   FLUoxetine (PROZAC) 20 MG tablet

## 2017-10-20 DIAGNOSIS — J849 Interstitial pulmonary disease, unspecified: Secondary | ICD-10-CM | POA: Insufficient documentation

## 2017-10-20 NOTE — Assessment & Plan Note (Signed)
bp in fair control at this time  BP Readings from Last 1 Encounters:  10/19/17 124/78   No changes needed Disc lifstyle change with low sodium diet and exercise

## 2017-10-20 NOTE — Assessment & Plan Note (Signed)
Pt had one shot after level  Lab Results  Component Value Date   VITAMINB12 237 06/30/2017    Was told to take otc 1000 mcg daily and she stopped Urged to re start it  Lab in 6 wk with tsh  F/u as planned  Hope this will help energy level /cognitive status

## 2017-10-20 NOTE — Assessment & Plan Note (Signed)
No recent events.

## 2017-10-20 NOTE — Assessment & Plan Note (Signed)
No clinical change  For f/u with pulm in the spring

## 2017-10-20 NOTE — Assessment & Plan Note (Signed)
Working on B12 level/hypothyroidism and depression with anxiety  F/u planned after labs to further discuss once above are under control to see if we suspect underlying dementia

## 2017-10-20 NOTE — Assessment & Plan Note (Signed)
Unsure if she has this No symptoms currently

## 2017-10-20 NOTE — Assessment & Plan Note (Signed)
Pt states this is still intermittent Rev GI notes-suspect bacterial overgrowth from her chronic abx for knee infection  Enc her to take the xifaxan as directed to keep this under control  If no further improvement she will f/u with GI

## 2017-10-20 NOTE — Assessment & Plan Note (Signed)
Lab Results  Component Value Date   TSH 5.21 (H) 06/30/2017   Will start levothyroxine 25 mcg daily in am before food or other medicines  Re check tsh in 6 wk  Hope this will help energy level/mood/cognitive status  F/u planned

## 2017-10-20 NOTE — Assessment & Plan Note (Signed)
No clinical changes  Has appt with pulmonary in the spring  Chronic cough continues

## 2017-10-20 NOTE — Assessment & Plan Note (Signed)
Reviewed stressors/ coping techniques/symptoms/ support sources/ tx options and side effects in detail today Overall improving with fluoxetine 10- will inc to 20 mg daily  Discussed expectations of SSRI medication including time to effectiveness and mechanism of action, also poss of side effects (early and late)- including mental fuzziness, weight or appetite change, nausea and poss of worse dep or anxiety (even suicidal thoughts)  Pt voiced understanding and will stop med and update if this occurs   Disc imp of self care/nutrition  Will attempt to get upper body exercise since she is limited by chronic knee infection

## 2017-10-26 ENCOUNTER — Ambulatory Visit: Payer: Self-pay | Admitting: Internal Medicine

## 2017-11-02 ENCOUNTER — Ambulatory Visit: Payer: Medicare Other | Admitting: Psychology

## 2017-11-02 ENCOUNTER — Telehealth: Payer: Self-pay | Admitting: Gastroenterology

## 2017-11-02 NOTE — Telephone Encounter (Signed)
Patients daughter called and stated that [facility] isn't going to supple Xifaxon any longer due to the price. Can you call it into [facility] and please call patient and let her know.

## 2017-11-03 NOTE — Telephone Encounter (Signed)
Patients rx for Xifaxan price checked at [facility] the cost was $1,609.  I left message with pt informing her that the rx has not been sent and I will check with [facility] to see if they can get it cheaper for her.  Thanks [employer]

## 2017-11-05 ENCOUNTER — Telehealth: Payer: Self-pay | Admitting: Gastroenterology

## 2017-11-05 NOTE — Telephone Encounter (Signed)
Yes, I checked with [facility] to see if they could get med for her for a  cheaper price. He stated this was not one of the meds they worked with.  I contacted pt to let her know I LVM telling them rx would cost $1,609 and the rx has not been called to pharmacy. I think the next step would be to offer her samples from the office.

## 2017-11-05 NOTE — Telephone Encounter (Signed)
Patient left a voice message that she was suppose to have a RX for xifaxon called in.

## 2017-11-06 ENCOUNTER — Telehealth: Payer: Self-pay | Admitting: Gastroenterology

## 2017-11-06 ENCOUNTER — Ambulatory Visit (INDEPENDENT_AMBULATORY_CARE_PROVIDER_SITE_OTHER): Payer: Medicare Other | Admitting: Psychology

## 2017-11-06 ENCOUNTER — Encounter: Payer: Self-pay | Admitting: Gastroenterology

## 2017-11-06 DIAGNOSIS — F4321 Adjustment disorder with depressed mood: Secondary | ICD-10-CM | POA: Diagnosis not present

## 2017-11-06 NOTE — Telephone Encounter (Signed)
*  STAT* If patient is at the pharmacy, call can be transferred to refill team.   1. Which medications need to be refilled? (please list name of each medication and dose if known) Xifaxan 550 mg x3   2. Which pharmacy/location (including street and city if local pharmacy) is medication to be sent to? [place]  3. Do they need a 30 day or 90 day supply?30 day

## 2017-11-06 NOTE — Telephone Encounter (Signed)
Informed patient of cost and that we could provide samples for her to use until we can get something different for her that is more affordable//[place]

## 2017-11-09 NOTE — Telephone Encounter (Signed)
Error

## 2017-11-12 ENCOUNTER — Ambulatory Visit (INDEPENDENT_AMBULATORY_CARE_PROVIDER_SITE_OTHER): Payer: Medicare Other | Admitting: Family Medicine

## 2017-11-12 ENCOUNTER — Encounter: Payer: Self-pay | Admitting: Family Medicine

## 2017-11-12 DIAGNOSIS — R053 Chronic cough: Secondary | ICD-10-CM

## 2017-11-12 DIAGNOSIS — R05 Cough: Secondary | ICD-10-CM

## 2017-11-12 MED ORDER — DOXYCYCLINE HYCLATE 100 MG PO TABS: 100.0000 mg | ORAL_TABLET | Freq: Two times a day (BID) | ORAL | 0 refills | Status: DC

## 2017-11-12 MED ORDER — BENZONATATE 200 MG PO CAPS: 200.0000 mg | ORAL_CAPSULE | Freq: Two times a day (BID) | ORAL | Status: DC

## 2017-11-12 NOTE — Patient Instructions (Signed)
Likely bronchitis.   Start taking doxycycline, continue keflex, and update [place] as needed.  Take care.  Glad to see you.

## 2017-11-12 NOTE — Progress Notes (Signed)
Cough.  On keflex at baseline and able to tolerate that.  She has a cough at baseline, with tessalon used daily.  Her situation got worse in the last few days.  More cough now, more sputum, yellowish.  No fevers known.  No vomiting, no diarrhea.  No ear pain.  She is on O2 at baseline at night.  Lowest O2 sat was 89% off O2 at home.  Normal O2 sat today.  No known wheeze.    Meds, vitals, and allergies reviewed.   ROS: Per HPI unless specifically indicated in ROS section   GEN: nad, alert and oriented HEENT: mucous membranes moist, tm w/o erythema, nasal exam w/o erythema, clear discharge noted,  OP with cobblestoning NECK: supple w/o LA CV: rrr.   PULM: Coarse BS in the RLL but ctab o/w, no inc wob EXT: no edema

## 2017-11-13 ENCOUNTER — Ambulatory Visit: Payer: Self-pay

## 2017-11-13 NOTE — Assessment & Plan Note (Signed)
At this point still okay for outpatient follow-up.  Presumed bronchitis. Start taking doxycycline, continue keflex, and update [place] as needed.  Discussed with patient.  She agrees with plan.

## 2017-11-13 NOTE — Telephone Encounter (Signed)
Patient's daughter called to say the patient is worse than she was yesterday when she saw the doctor, she is not feeling any better. I asked the daughter is the patient urinating, she said "she is urinating, but she is weaker today. Should I just wait and see how the medicine works?" She put the patient on the phone, the patient says she doesn't feel good today. I asked what is going on, she says "I'm just not better." I asked is she nauseated, she said "I was, but not now." I asked is she drinking and eating, she said "I do drink, but I don't have an appetite." I advised the patient and her daughter to keep hydrated. I advised to give the antibiotic time to work.  I advised if the patient did not feel any better tomorrow or any time over the weekend, to go to the ED to be evaluated, she verbalized understanding.

## 2017-11-20 ENCOUNTER — Ambulatory Visit (INDEPENDENT_AMBULATORY_CARE_PROVIDER_SITE_OTHER): Payer: Medicare Other | Admitting: Psychology

## 2017-11-20 DIAGNOSIS — F4321 Adjustment disorder with depressed mood: Secondary | ICD-10-CM

## 2017-11-24 ENCOUNTER — Telehealth: Payer: Self-pay | Admitting: Adult Health

## 2017-11-24 MED ORDER — BENZONATATE 200 MG PO CAPS: 200.0000 mg | ORAL_CAPSULE | Freq: Two times a day (BID) | ORAL | 3 refills | Status: AC | PRN

## 2017-11-24 NOTE — Telephone Encounter (Signed)
Rx has been sent in per RA. Nothing further was needed.

## 2017-11-24 NOTE — Telephone Encounter (Signed)
Okay to refill x3

## 2017-11-24 NOTE — Telephone Encounter (Signed)
Received faxed [facility] from [facility] [place] for Tessalon 200mg  #180 1po BID prn for cough  Patient seen here for Pulmonary Hypertension and Chronic Cough Last ov 10.12.18 w/ RA, recs to follow up in 6 months with [facility] Tessalon last refilled by TP per the 3.7.18 phone note for #180 with 3 additional refills  Because pt last seen by RA, will route to him for authorization  Dr Elsworth Soho please advise, thank you

## 2017-11-30 ENCOUNTER — Other Ambulatory Visit: Payer: Medicare Other

## 2017-12-04 ENCOUNTER — Telehealth: Payer: Self-pay | Admitting: Gastroenterology

## 2017-12-04 ENCOUNTER — Ambulatory Visit (INDEPENDENT_AMBULATORY_CARE_PROVIDER_SITE_OTHER): Payer: Medicare Other | Admitting: Psychology

## 2017-12-04 DIAGNOSIS — F4321 Adjustment disorder with depressed mood: Secondary | ICD-10-CM

## 2017-12-04 NOTE — Telephone Encounter (Signed)
Patient's daughter called and [name] is having diarrhea again. Started a couple days ago. She needs an antibiotic. Please call daughter Shauna Hugh # [phone].

## 2017-12-07 ENCOUNTER — Telehealth: Payer: Self-pay | Admitting: Gastroenterology

## 2017-12-07 ENCOUNTER — Other Ambulatory Visit: Payer: Self-pay

## 2017-12-07 MED ORDER — CIPROFLOXACIN HCL 500 MG PO TABS: 500.0000 mg | ORAL_TABLET | Freq: Two times a day (BID) | ORAL | 0 refills | Status: AC

## 2017-12-07 MED ORDER — METRONIDAZOLE 500 MG PO TABS: 500.0000 mg | ORAL_TABLET | Freq: Two times a day (BID) | ORAL | 0 refills | Status: AC

## 2017-12-07 NOTE — Telephone Encounter (Signed)
[facility]

## 2017-12-07 NOTE — Telephone Encounter (Signed)
Medication called into pharmacy for abx treament of diarrhea.

## 2017-12-07 NOTE — Telephone Encounter (Signed)
Patient [name] and is struggling with diarrhea again. What can she do?

## 2017-12-18 ENCOUNTER — Ambulatory Visit (INDEPENDENT_AMBULATORY_CARE_PROVIDER_SITE_OTHER): Payer: Medicare Other | Admitting: Psychology

## 2017-12-18 DIAGNOSIS — F4321 Adjustment disorder with depressed mood: Secondary | ICD-10-CM

## 2018-01-08 ENCOUNTER — Ambulatory Visit: Payer: Medicare Other | Admitting: Psychology

## 2018-01-12 ENCOUNTER — Telehealth: Payer: Self-pay | Admitting: Family Medicine

## 2018-01-12 ENCOUNTER — Encounter: Payer: Self-pay | Admitting: Family Medicine

## 2018-01-12 ENCOUNTER — Ambulatory Visit (INDEPENDENT_AMBULATORY_CARE_PROVIDER_SITE_OTHER): Payer: Medicare Other | Admitting: Family Medicine

## 2018-01-12 VITALS — BP 130/74 | HR 69 | Temp 97.4°F | Ht 65.0 in | Wt 213.5 lb

## 2018-01-12 DIAGNOSIS — E538 Deficiency of other specified B group vitamins: Secondary | ICD-10-CM | POA: Diagnosis not present

## 2018-01-12 DIAGNOSIS — R5381 Other malaise: Secondary | ICD-10-CM | POA: Diagnosis not present

## 2018-01-12 DIAGNOSIS — E039 Hypothyroidism, unspecified: Secondary | ICD-10-CM

## 2018-01-12 DIAGNOSIS — J849 Interstitial pulmonary disease, unspecified: Secondary | ICD-10-CM | POA: Diagnosis not present

## 2018-01-12 DIAGNOSIS — I471 Supraventricular tachycardia: Secondary | ICD-10-CM | POA: Diagnosis not present

## 2018-01-12 DIAGNOSIS — I2699 Other pulmonary embolism without acute cor pulmonale: Secondary | ICD-10-CM | POA: Diagnosis not present

## 2018-01-12 DIAGNOSIS — I272 Pulmonary hypertension, unspecified: Secondary | ICD-10-CM | POA: Diagnosis not present

## 2018-01-12 DIAGNOSIS — I10 Essential (primary) hypertension: Secondary | ICD-10-CM

## 2018-01-12 DIAGNOSIS — R413 Other amnesia: Secondary | ICD-10-CM | POA: Diagnosis not present

## 2018-01-12 DIAGNOSIS — J9611 Chronic respiratory failure with hypoxia: Secondary | ICD-10-CM

## 2018-01-12 LAB — COMPREHENSIVE METABOLIC PANEL
ALK PHOS: 62 U/L (ref 39–117)
ALT: 14 U/L (ref 0–35)
AST: 20 U/L (ref 0–37)
Albumin: 4 g/dL (ref 3.5–5.2)
BUN: 24 mg/dL — ABNORMAL HIGH (ref 6–23)
CO2: 35 meq/L — AB (ref 19–32)
Calcium: 10.3 mg/dL (ref 8.4–10.5)
Chloride: 98 mEq/L (ref 96–112)
Creatinine, Ser: 0.98 mg/dL (ref 0.40–1.20)
GFR: 57.72 mL/min — ABNORMAL LOW (ref >60.00)
GLUCOSE: 127 mg/dL — AB (ref 70–99)
POTASSIUM: 4 meq/L (ref 3.5–5.1)
Sodium: 140 mEq/L (ref 135–145)
TOTAL PROTEIN: 7.1 g/dL (ref 6.0–8.3)
Total Bilirubin: 0.8 mg/dL (ref 0.2–1.2)

## 2018-01-12 LAB — TSH: TSH: 4.45 u[IU]/mL (ref 0.35–4.50)

## 2018-01-12 LAB — VITAMIN B12: VITAMIN B 12: 519 pg/mL (ref 211–911)

## 2018-01-12 MED ORDER — FLUOXETINE HCL 20 MG PO TABS: 30.0000 mg | ORAL_TABLET | Freq: Every day | ORAL | 11 refills | Status: DC

## 2018-01-12 NOTE — Telephone Encounter (Signed)
Thanks so much- will note in the chart

## 2018-01-12 NOTE — Progress Notes (Signed)
Subjective:    Patient ID: [name], female    DOB: 01-24-1934, 84 y.o.   MRN: [id number]  HPI  Here for f/u of chronic health problems   Wt Readings from Last 3 Encounters:  01/12/18 213 lb 8 oz (96.8 kg)  11/12/17 213 lb 8 oz (96.8 kg)  10/19/17 215 lb (97.5 kg)   35.53 kg/m   Stress reaction Last visit was ref to counseling  Inc dose of prozac from 10 to 20 mg  Thinks it has helped  Not depressed or down or sad  Still anxious - she is worse in the am (when she has to have her leg dressed)  When she is anxious - gets shaky (and feels like she is out of control) , not tearful  No new stressors - she is not particularly worried about anything Little to no caffeine  Is ok with inc her prozac   Likes to get out of the house  Some socialization  Goes out with a group  Goes to church   Is not very mobile  Lack of exercise may be a problem  She has arm weights but does not use them  Does not get outdoors   Thinks her leg is stable/still drains and both legs hurt more than they used to    Chronic diarrhea- doing much better with her medicine now  Appetite is good Sleep is also good    bp is stable today  No cp or palpitations or headaches or edema  No side effects to medicines  BP Readings from Last 3 Encounters:  01/12/18 130/74  11/12/17 122/68  10/19/17 124/78      Lab Results  Component Value Date   TSH 5.21 (H) 06/30/2017    Started levothyroxine 25 mcg -no side effects  Due for labs  No clinical changes   Lab Results  Component Value Date   VITAMINB12 237 06/30/2017   Was inst to re start B12 1000 mcg daily  Due for a re check  Was hoping tx of these things would help her memory problems  Pt thinks memory is stable to slt improved-her son agrees with her today    Patient Active Problem List   Diagnosis Date Noted  . ILD (interstitial lung disease) ([place]) 10/20/2017  . Hypothyroid 07/03/2017  . B12 deficiency 07/03/2017  . Encounter for  audiology evaluation 07/03/2017  . Encounter for screening mammogram for breast cancer 07/03/2017  . Tearfulness 06/28/2017  . Memory loss 06/28/2017  . Poor balance 09/26/2016  . Neck pain 07/30/2016  . Diarrhea 07/30/2016  . Routine general medical examination at a health care facility 06/26/2016  . Elevated TSH 06/25/2016  . Stress reaction 06/04/2016  . Intertrigo 06/04/2016  . Chronic cough 05/01/2016  . Constipation 01/09/2016  . Umbilical hernia 02/63/7858  . Chronic respiratory failure ([place]) 10/05/2015  . Thromboembolism of vein 10/02/2015  . Infection of prosthetic joint ([place]) 09/18/2015  . Infection of prosthetic left knee joint ([place]) 09/12/2015  . Effusion of knee 08/30/2015  . Pulmonary hypertension ([place])   . Physical deconditioning 02/25/2015  . RBBB-new 02/12/2015  . History of bilat PE after TKR (off Coumadin Dec 2015) 02/12/2015  . Abnormal EKG-TWI, RBBB, prolonged QT   . Hernia, umbilical 85/11/7739  . Hx of colonic polyps 10/24/2014  . Diverticulosis of colon without hemorrhage   . OSA (obstructive sleep apnea) 07/22/2014  . Unspecified arthropathy, lower leg 03/21/2014  . Status post surgery 03/15/2014  .  Arthritis of knee 03/09/2014  . Solitary pulmonary nodule 05/03/2013  . Sternal fracture 04/26/2013  . PE (pulmonary embolism) 11/24/2012  . History of DVT (deep vein thrombosis) 11/24/2012  . History of artificial joint 10/29/2012  . 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 Hypertension 10/14/2012  . Complications due to internal joint prosthesis ([place]) 09/07/2012  . PES PLANUS 08/15/2010  . URINARY FREQUENCY, CHRONIC 07/14/2008  . Dyslipidemia 07/09/2007  . Essential hypertension 07/09/2007  . PSVT with SSS 02/23/2007  . SCLERODERMA 02/23/2007  . DEGENERATIVE JOINT DISEASE 02/23/2007  . SPINAL STENOSIS, LUMBAR 02/23/2007  . History of osteopenia 02/23/2007  . SPONDYLOLISTHESIS 02/23/2007   Past Medical History:  Diagnosis Date  . Anemia   .  Arthritis    knee  . Diverticulosis   . Hx of colonic polyps 10/24/2014  . Hyperlipidemia   . Hypertension   . Osteopenia   . Overactive bladder    urge incontinence  . PSVT (paroxysmal supraventricular tachycardia) ([place])   . Pulmonary emboli ([place])   . Pulmonary hypertension ([place])   . Right ventricular failure ([place])   . Sleep apnea    CPAP  . Spinal stenosis    Past Surgical History:  Procedure Laterality Date  . BLADDER SUSPENSION  1993  . CARDIAC CATHETERIZATION N/A 02/28/2015   Procedure: Right/Left Heart Cath and Coronary Angiography;  Surgeon: Troy Sine, MD;  Location: [facility];  Service: Cardiovascular;  Laterality: N/A;  . CATARACT EXTRACTION Bilateral 2008  . CHOLECYSTECTOMY    . COLONOSCOPY    . COLONOSCOPY N/A 10/24/2014   Procedure: COLONOSCOPY;  Surgeon: Gatha Mayer, MD;  Location: [facility];  Service: Endoscopy;  Laterality: N/A;  . CYST REMOVAL LEG Left 02/15/2016   cyst removal from back of left knee  . ELECTROPHYSIOLOGIC STUDY N/A 03/02/2015   Procedure: SVT Ablation;  Surgeon: Evans Lance, MD;  Location: [facility];  Service: Cardiovascular;  Laterality: N/A;  . INCISIONAL HERNIA REPAIR    . RIGHT HEART CATHETERIZATION N/A 07/04/2014   Procedure: RIGHT HEART CATH;  Surgeon: Jolaine Artist, MD;  Location: [facility] CATH LAB;  Service: Cardiovascular;  Laterality: N/A;  . TONSILLECTOMY AND ADENOIDECTOMY    . TOTAL KNEE ARTHROPLASTY Left 2000  . TOTAL KNEE ARTHROPLASTY Left 10/29/2012   at [facility]  . TOTAL KNEE ARTHROPLASTY Right 03/09/2014  . TOTAL KNEE REVISION  01/12/2012   Procedure: TOTAL KNEE REVISION;  Surgeon: Kerin Salen, MD;  Location: [facility];  Service: Orthopedics;  Laterality: Left;  DEPUY/ LCS , HAND SET  . TRIGGER FINGER RELEASE  01/12/2012   Procedure: RELEASE TRIGGER FINGER/A-1 PULLEY;  Surgeon: Kerin Salen, MD;  Location: [facility];  Service: Orthopedics;  Laterality: Right;   Social History   Tobacco Use  . Smoking  status: Never Smoker  . Smokeless tobacco: Never Used  Substance Use Topics  . Alcohol use: No    Alcohol/week: 0.0 oz  . Drug use: No   Family History  Problem Relation Age of Onset  . Breast cancer Mother 53  . Heart disease Father   . Diabetes Father   . Hypertension Father   . Uterine cancer Sister    Allergies  Allergen Reactions  . Prednisone Other (See Comments)    hyperctivity  . Zithromax [Azithromycin] Other (See Comments)    Prolonged QT on EKG  . Penicillins Hives and Swelling    Questionable allergy (?) Currently on cefazolin, tolerating well since 4/21 Patient is tolerating  dicloxacillin without issue. No skin changes or other concerns.   . Sulfonamide Derivatives Hives  . Adhesive [Tape] Other (See Comments) and Rash    redness   Current Outpatient Medications on File Prior to Visit  Medication Sig Dispense Refill  . benzonatate (TESSALON) 200 MG capsule Take 1 capsule (200 mg total) by mouth 2 (two) times daily as needed for cough. 180 capsule 3  . Calcium Carb-Cholecalciferol (CALCIUM 600/VITAMIN D3) 600-800 MG-UNIT TABS Take 1 tablet by mouth 2 (two) times daily.    . cephALEXin (KEFLEX) 500 MG capsule 500 mg 3 (three) times daily.     . Cholecalciferol (VITAMIN D-1000 MAX ST) 1000 UNITS tablet Take 1,000 Units by mouth daily.    . hydrochlorothiazide (HYDRODIURIL) 25 MG tablet TAKE 1 TABLET DAILY 90 tablet 3  . levothyroxine (SYNTHROID, LEVOTHROID) 25 MCG tablet Take 1 tablet (25 mcg total) by mouth daily before breakfast. 30 tablet 11  . metoprolol tartrate (LOPRESSOR) 25 MG tablet TAKE ONE-HALF (1/2) OF A TABLET BY MOUTHTWICE DAILY *PT DUE FOR APPT,MUST CALL TO SCHEDULE [phone]* 15 tablet 0  . naproxen sodium (ANAPROX) 220 MG tablet Take 220 mg by mouth daily.     . OXYGEN Inhale 2 L into the lungs at bedtime.    Vladimir Faster Glycol-Propyl Glycol (SYSTANE) 0.4-0.3 % SOLN Apply 1 drop to eye 2 (two) times daily.     No current facility-administered  medications on file prior to visit.     Review of Systems     Objective:   Physical Exam  Constitutional: She appears well-developed and well-nourished. No distress.  obese and well appearing   HENT:  Head: Normocephalic and atraumatic.  Mouth/Throat: Oropharynx is clear and moist.  Eyes: Conjunctivae and EOM are normal. Pupils are equal, round, and reactive to light. Right eye exhibits no discharge. Left eye exhibits no discharge. No scleral icterus.  Neck: Normal range of motion. Neck supple. No JVD present. Carotid bruit is not present. No thyromegaly present.  Cardiovascular: Normal rate, regular rhythm, normal heart sounds and intact distal pulses. Exam reveals no gallop.  Pulmonary/Chest: Effort normal and breath sounds normal. No respiratory distress. She has no wheezes. She has no rales.  No crackles  Abdominal: Soft. Bowel sounds are normal. She exhibits no distension, no abdominal bruit and no mass. There is no tenderness.  Musculoskeletal: She exhibits no edema.  Baseline wound L leg/knee- wrapped  Lymphadenopathy:    She has no cervical adenopathy.  Neurological: She is alert. She has normal reflexes. She displays no tremor. No cranial nerve deficit. She exhibits normal muscle tone. Coordination normal.  Generally poor conditioning and balance  Skin: Skin is warm and dry. No rash noted. No pallor.  Psychiatric: She has a normal mood and affect.  Fairly mentally alert and attentive today  Notably less anxious than in the past Not tearful  Supportive son is present           Assessment & Plan:   Problem List Items Addressed This Visit      Cardiovascular and Mediastinum   Essential hypertension    bp in fair control at this time  BP Readings from Last 1 Encounters:  01/12/18 130/74   No changes needed Disc lifstyle change with low sodium diet and exercise  Last labs rev      Relevant Orders   Comprehensive metabolic panel (Completed)   PSVT with SSS     No recent events      RESOLVED: Pulmonary  embolism ([place])   Pulmonary hypertension (HCC)    No clinical changes Continue pulmonary f/u        Respiratory   Chronic respiratory failure (HCC)    Continues 02 at night Continue pulm f/u  stable      ILD (interstitial lung disease) ([place])    Continue pulm f/u stable        Endocrine   Hypothyroid - Primary    Lab today on levothyroxine       Relevant Orders   TSH (Completed)     Other   B12 deficiency    Pt is supposed to take 1000 mcg daily  B12 level today       Relevant Orders   Vitamin B12 (Completed)   Memory loss    Now tx for hypothyroid and B12 def and anx/dep Some improvement  Continue to follow      Physical deconditioning    Did enc her to use hand weights and do some chair exercise

## 2018-01-12 NOTE — Telephone Encounter (Signed)
Last visit we increased her from 10 mg to 20 mg of prozac daily  Today I increased her from 20 mg to 30 mg daily   If she has not been taking the 20 mg -let me know /please confirm with her  Thanks

## 2018-01-12 NOTE — Telephone Encounter (Signed)
Called home and daughter who fixes pill box wasn't available, I will call back in AM to check and see

## 2018-01-12 NOTE — Patient Instructions (Addendum)
More exercise may help with your anxiety (more physical activity)  Try to be more active  Walking in a safe area with walker when you can  Using light weight for arm exercises while sitting  Get out of the house when you can is also helpful  Getting outdoors more in good weather is important also    We can increase your prozac to 30 mg and see if that helps your anxiety  That means take 1 1/2 pills of your prozac 20 mg once daily   Labs today for thyroid and B12   Follow up in 3 months   Call and let us know if you have been taking the vitamin B12

## 2018-01-12 NOTE — Telephone Encounter (Signed)
On current med list prozac 20 mg tab with instructions 1 1/2 tab (30 mg) daily. Please advise.

## 2018-01-12 NOTE — Telephone Encounter (Signed)
Copied from [place] [id number]. Topic: Quick Communication - See Telephone Encounter >> Jan 12, 2018 11:25 AM Bea Graff, NT wrote: CRM for notification. See Telephone encounter for: Pts son, Herbie [place] calling to let the office know that his mom has been taking viatmin B12. He said it was in question if the pt had been taking it.   01/12/18.

## 2018-01-12 NOTE — Telephone Encounter (Signed)
Copied from [place]. Topic: Quick Communication - See Telephone Encounter >> Jan 12, 2018  2:42 PM Ether Griffins B wrote: CRM for notification. See Telephone encounter for:  Tammy with [facility] calling to clarify that pt is suppose to be on prozac 30 mg 1 1/2 tab a day. When the past 3 months pt has been taking the 10mg  1  tab a day. The last time she got a 20 mg in prozac was 10/19/17. Call back number [phone] 01/12/18.

## 2018-01-13 NOTE — Telephone Encounter (Signed)
Called # twice and it was busy both times

## 2018-01-13 NOTE — Telephone Encounter (Signed)
Then increase to 20 mg daily (not 30) Alert me if any problems Please correct on med list

## 2018-01-13 NOTE — Assessment & Plan Note (Signed)
No recent events.

## 2018-01-13 NOTE — Telephone Encounter (Signed)
Daughter [name] called to advise the pt has NOT been taking 20 mg.  [name] states the bottle had said 1/ 10 mg and that is what pt has been taking.

## 2018-01-13 NOTE — Assessment & Plan Note (Signed)
Now tx for hypothyroid and B12 def and anx/dep Some improvement  Continue to follow

## 2018-01-13 NOTE — Assessment & Plan Note (Signed)
Pt is supposed to take 1000 mcg daily  B12 level today

## 2018-01-13 NOTE — Assessment & Plan Note (Signed)
Continue pulm f/u stable

## 2018-01-13 NOTE — Assessment & Plan Note (Signed)
Lab today on levothyroxine

## 2018-01-13 NOTE — Assessment & Plan Note (Signed)
No clinical changes Continue pulmonary f/u

## 2018-01-13 NOTE — Assessment & Plan Note (Signed)
Did enc her to use hand weights and do some chair exercise

## 2018-01-13 NOTE — Assessment & Plan Note (Signed)
bp in fair control at this time  BP Readings from Last 1 Encounters:  01/12/18 130/74   No changes needed Disc lifstyle change with low sodium diet and exercise  Last labs rev

## 2018-01-13 NOTE — Assessment & Plan Note (Signed)
Continues 02 at night Continue pulm f/u  stable

## 2018-01-14 ENCOUNTER — Telehealth: Payer: Self-pay | Admitting: Family Medicine

## 2018-01-14 MED ORDER — FLUOXETINE HCL 20 MG PO TABS: 20.0000 mg | ORAL_TABLET | Freq: Every day | ORAL | 11 refills | Status: DC

## 2018-01-14 NOTE — Telephone Encounter (Signed)
Left VM letting [place] know Dr. [name] only wants pt to take one tab of the 20 mg and not 1.5, so new directions would be take one tab once daily not 1.5 and that once we get in touch with pt's daughter we will advise her of the dose change too

## 2018-01-14 NOTE — Telephone Encounter (Signed)
Copied from [place] [id number]. Topic: Quick Communication - See Telephone Encounter >> Jan 14, 2018  9:09 AM Conception Chancy, NT wrote: CRM for notification. See Telephone encounter for: 01/14/18.  [facility] and is needing the nurse to contact her in regards to FLUoxetine (PROZAC) 20 MG tablet please contact.  [phone]  [id number] [place] [place]  [address] [place] [address]  Phone: [phone] Fax: [phone]

## 2018-01-14 NOTE — Telephone Encounter (Signed)
Addressed through another phone note 

## 2018-01-14 NOTE — Telephone Encounter (Signed)
Also called pharmacy and advise them of the correct dose pt should be on and request that if they speak with pt's daughter to advise her pt should only take 20mg  qd not 30mg 

## 2018-01-14 NOTE — Telephone Encounter (Signed)
Larene Beach with [facility] said [place] was calling about pts prozac. When she went to transfer me the call ended. [facility] [place] and Richmond Campbell was not there and spoke with Vicente Males pharmacist that pt should now be taking prozac 20 mg taking one tab daily. Vicente Males voiced understanding. I tried calling [name] pts daughter to make sure it was not [name] calling (DPR signed). I spoke with [name] and advised pt should be taking prozac 20 mg daily. [name] said pt has been taking 10 mg daily but she will increase to 20 mg daily and when pt is out of the 10 mg tabs she will get the new rx. FYI to [place] that pharmacy and pts daughter have been notified and both voiced understanding.

## 2018-01-14 NOTE — Telephone Encounter (Signed)
See 01/12/18 phone note.

## 2018-01-14 NOTE — Telephone Encounter (Signed)
Left VM requesting pt's daughter to call the office back 

## 2018-01-15 ENCOUNTER — Ambulatory Visit (INDEPENDENT_AMBULATORY_CARE_PROVIDER_SITE_OTHER): Payer: Medicare Other | Admitting: Psychology

## 2018-01-15 DIAGNOSIS — F4321 Adjustment disorder with depressed mood: Secondary | ICD-10-CM

## 2018-01-18 ENCOUNTER — Encounter: Payer: Self-pay | Admitting: *Deleted

## 2018-02-05 ENCOUNTER — Ambulatory Visit (INDEPENDENT_AMBULATORY_CARE_PROVIDER_SITE_OTHER): Payer: Medicare Other | Admitting: Psychology

## 2018-02-05 ENCOUNTER — Ambulatory Visit: Payer: Self-pay | Admitting: Adult Health

## 2018-02-05 DIAGNOSIS — F4321 Adjustment disorder with depressed mood: Secondary | ICD-10-CM

## 2018-02-08 ENCOUNTER — Ambulatory Visit (INDEPENDENT_AMBULATORY_CARE_PROVIDER_SITE_OTHER): Payer: Medicare Other | Admitting: Adult Health

## 2018-02-08 ENCOUNTER — Encounter: Payer: Self-pay | Admitting: Adult Health

## 2018-02-08 DIAGNOSIS — J9611 Chronic respiratory failure with hypoxia: Secondary | ICD-10-CM | POA: Diagnosis not present

## 2018-02-08 DIAGNOSIS — I272 Pulmonary hypertension, unspecified: Secondary | ICD-10-CM | POA: Diagnosis not present

## 2018-02-08 DIAGNOSIS — I2781 Cor pulmonale (chronic): Secondary | ICD-10-CM | POA: Insufficient documentation

## 2018-02-08 NOTE — Assessment & Plan Note (Signed)
Appears compensated without volume overload on exam  Cont on current regimen .

## 2018-02-08 NOTE — Progress Notes (Signed)
@Patient  ID: [name], female    DOB: January 14, 1934, 84 y.o.   MRN: [id number]  No chief complaint on file.   Referring provider: Abner Greenspan, MD  HPI: 31 -year-old never smoker  with pulmonary hypertension, OSA CPAP intolerant and Chronic hypoxic respiratory failure on O2 (w/ CPAP) , PE , Cor Pulmonale and PSVT /A-Fib s/p Ablation 02/2015  PMH : Atrial Fib and PSVT s/pablation in 02/2015  TEST  PFTs (05/25/14) FEV1 1.88 L (88%) FVC 2.22 L (77%) ratio 114, DLCO 68%   HRCT chest 01/2015 mild bronchiectasis right lower lobe VQ 05/30/14: Low prob   PSG 06/2014 >> severe OSA, AHI 62 per hour, corrected by CPAP 12 cm with small fullface mask. Central apneas emergent higher pressures  ONO on CPPA/RA shows 71 min desatn -Add 2L O2 to CPAP ONO on CPAP/2 L O2 >>no desatn  Echo 02/12/15>> EF 55%, mild MR, moderate TR, PA pressure 57, RV severely dilated, small pericardial effusion, LA 37. [place] 02/2015 >mild to mod Res[facility]   02/08/2018 Follow up : Pulmonary HTN , O2 RF , Cor Pulmonale  Pt returns for 6 month follow up . Says breathing is doing the same. Gets winded with heavy activity . Uses a walker to get around . Says her cough has resolved.  Remains on Oxygen At bedtime at 2l/m . Denies increased ankle edema or orthopnea.  Remains on HCTZ daily .      Allergies  Allergen Reactions  . Prednisone Other (See Comments)    hyperctivity  . Zithromax [Azithromycin] Other (See Comments)    Prolonged QT on EKG  . Penicillins Hives and Swelling    Questionable allergy (?) Currently on cefazolin, tolerating well since 4/21 Patient is tolerating dicloxacillin without issue. No skin changes or other concerns.   . Sulfonamide Derivatives Hives  . Adhesive [Tape] Other (See Comments) and Rash    redness    Immunization History  Administered Date(s) Administered  . Influenza Split 07/29/2011  . Influenza Whole 08/14/2005, 07/28/2007, 07/14/2008, 07/26/2009, 07/16/2010  .  Influenza,inj,Quad PF,6+ Mos 07/19/2013, 07/14/2014, 07/11/2015, 06/25/2016, 07/03/2017  . Pneumococcal Conjugate-13 07/14/2014  . Pneumococcal Polysaccharide-23 06/25/2006, 08/15/2010  . Td 03/27/2004, 02/20/2012  . Tdap 02/20/2012  . Zoster 11/01/2007    Past Medical History:  Diagnosis Date  . Anemia   . Arthritis    knee  . Diverticulosis   . Hx of colonic polyps 10/24/2014  . Hyperlipidemia   . Hypertension   . Osteopenia   . Overactive bladder    urge incontinence  . PSVT (paroxysmal supraventricular tachycardia) ([place])   . Pulmonary emboli ([place])   . Pulmonary hypertension ([place])   . Right ventricular failure ([place])   . Sleep apnea    CPAP  . Spinal stenosis     Tobacco History: Social History   Tobacco Use  Smoking Status Never Smoker  Smokeless Tobacco Never Used   Counseling given: Not Answered   Outpatient Encounter Medications as of 02/08/2018  Medication Sig  . benzonatate (TESSALON) 200 MG capsule Take 1 capsule (200 mg total) by mouth 2 (two) times daily as needed for cough.  . Calcium Carb-Cholecalciferol (CALCIUM 600/VITAMIN D3) 600-800 MG-UNIT TABS Take 1 tablet by mouth 2 (two) times daily.  . cephALEXin (KEFLEX) 500 MG capsule 500 mg 3 (three) times daily.   . Cholecalciferol (VITAMIN D-1000 MAX ST) 1000 UNITS tablet Take 1,000 Units by mouth daily.  Marland Kitchen FLUoxetine (PROZAC) 20 MG tablet Take 1 tablet (20 mg  total) by mouth daily.  . hydrochlorothiazide (HYDRODIURIL) 25 MG tablet TAKE 1 TABLET DAILY  . levothyroxine (SYNTHROID, LEVOTHROID) 25 MCG tablet Take 1 tablet (25 mcg total) by mouth daily before breakfast.  . metoprolol tartrate (LOPRESSOR) 25 MG tablet TAKE ONE-HALF (1/2) OF A TABLET BY MOUTHTWICE DAILY *PT DUE FOR APPT,MUST CALL TO SCHEDULE [phone]!*  . naproxen sodium (ANAPROX) 220 MG tablet Take 220 mg by mouth daily.   . OXYGEN Inhale 2 L into the lungs at bedtime.  Vladimir Faster Glycol-Propyl Glycol (SYSTANE) 0.4-0.3 % SOLN Apply 1 drop  to eye 2 (two) times daily.   No facility-administered encounter medications on file as of 02/08/2018.      Review of Systems  Constitutional:   No  weight loss, night sweats,  Fevers, chills,  +fatigue, or  lassitude.  HEENT:   No headaches,  Difficulty swallowing,  Tooth/dental problems, or  Sore throat,                No sneezing, itching, ear ache, nasal congestion, post nasal drip,   CV:  No chest pain,  Orthopnea, PND, swelling in lower extremities, anasarca, dizziness, palpitations, syncope.   GI  No heartburn, indigestion, abdominal pain, nausea, vomiting, diarrhea, change in bowel habits, loss of appetite, bloody stools.   Resp:   No excess mucus, no productive cough,  No non-productive cough,  No coughing up of blood.  No change in color of mucus.  No wheezing.  No chest wall deformity  Skin: no rash or lesions.  GU: no dysuria, change in color of urine, no urgency or frequency.  No flank pain, no hematuria   MS:  No joint pain or swelling.  No decreased range of motion.  No back pain.    Physical Exam  BP 102/66 (BP Location: Left Arm, Cuff Size: Large)   Pulse 66   Ht 5\' 6"  (1.676 m)   Wt 211 lb 9.6 oz (96 kg)   SpO2 94%   BMI 34.15 kg/m   GEN: A/Ox3; pleasant , NAD, obese elderly , walker    HEENT:  [place]/AT,  EACs-clear, TMs-wnl, NOSE-clear, THROAT-clear, no lesions, no postnasal drip or exudate noted.   NECK:  Supple w/ fair ROM; no JVD; normal carotid impulses w/o bruits; no thyromegaly or nodules palpated; no lymphadenopathy.    RESP  Clear  P & A; w/o, wheezes/ rales/ or rhonchi. no accessory muscle use, no dullness to percussion  CARD:  RRR, no m/r/g, tr  peripheral edema, pulses intact, no cyanosis or clubbing.  GI:   Soft & nt; nml bowel sounds; no organomegaly or masses detected.   Musco: Warm bil, no deformities or joint swelling noted.   Neuro: alert, no focal deficits noted.    Skin: Warm, no lesions or rashes    Lab  Results:  CBC   BMET  BNP No results found for: BNP  ProBNP   Imaging: No results found.   Assessment & Plan:   No problem-specific Assessment & Plan notes found for this encounter.     Rexene Edison, NP 02/08/2018

## 2018-02-08 NOTE — Patient Instructions (Addendum)
Continue on oxygen at 2L At bedtime   Follow up with Dr. Elsworth Soho  In 1 year and As needed

## 2018-02-08 NOTE — Assessment & Plan Note (Signed)
Cont on O2 At bedtime   

## 2018-02-08 NOTE — Assessment & Plan Note (Signed)
Continue on diuretic and oxygen

## 2018-02-17 NOTE — Progress Notes (Signed)
Reviewed & agree with plan  

## 2018-02-26 ENCOUNTER — Ambulatory Visit (INDEPENDENT_AMBULATORY_CARE_PROVIDER_SITE_OTHER): Payer: Medicare Other | Admitting: Psychology

## 2018-02-26 DIAGNOSIS — F4321 Adjustment disorder with depressed mood: Secondary | ICD-10-CM

## 2018-03-10 ENCOUNTER — Encounter: Payer: Medicare Other | Attending: Internal Medicine | Admitting: Internal Medicine

## 2018-03-10 DIAGNOSIS — Z8614 Personal history of Methicillin resistant Staphylococcus aureus infection: Secondary | ICD-10-CM | POA: Diagnosis not present

## 2018-03-10 DIAGNOSIS — M71062 Abscess of bursa, left knee: Secondary | ICD-10-CM | POA: Insufficient documentation

## 2018-03-10 DIAGNOSIS — L97822 Non-pressure chronic ulcer of other part of left lower leg with fat layer exposed: Secondary | ICD-10-CM | POA: Diagnosis not present

## 2018-03-10 DIAGNOSIS — Z96652 Presence of left artificial knee joint: Secondary | ICD-10-CM | POA: Insufficient documentation

## 2018-03-10 DIAGNOSIS — L97829 Non-pressure chronic ulcer of other part of left lower leg with unspecified severity: Secondary | ICD-10-CM | POA: Diagnosis not present

## 2018-03-11 NOTE — Progress Notes (Signed)
[name], [name] ([id number]) Visit Report for 03/10/2018 Abuse/Suicide Risk Screen Details [name], [name]. Date of Service: 03/10/2018 9:45 AM Medical Record Number: [id number] Patient Account Number: [id number] Date of Birth/Sex: Jul 29, 1934 (84 y.o. F) Treating RN: Roger Shelter Primary Care Remus Hagedorn: Loura Pardon Other Clinician: Referring Lakota Schweppe: Referral, Self Treating Mariadel Mruk/Extender: Tito Dine in Treatment: 0 Abuse/Suicide Risk Screen Items Answer ABUSE/SUICIDE RISK SCREEN: Has anyone close to you tried to hurt or harm you recentlyo No Do you feel uncomfortable with anyone in your familyo No Has anyone forced you do things that you didnot want to doo No Do you have any thoughts of harming yourselfo No Patient displays signs or symptoms of abuse and/or neglect. No Electronic Signature(s) Signed: 03/10/2018 3:23:03 PM By: Roger Shelter Entered By: Roger Shelter on 03/10/2018 10:34:20 [name] ([id number]) -------------------------------------------------------------------------------- Activities of Daily Living Details Patient Name: [name], [name]. Date of Service: 03/10/2018 9:45 AM Medical Record Number: [id number] Patient Account Number: [id number] Date of Birth/Sex: 1934-10-09 (84 y.o. F) Treating RN: Roger Shelter Primary Care Maze Corniel: Loura Pardon Other Clinician: Referring Raechell Singleton: Referral, Self Treating Xxavier Noon/Extender: Tito Dine in Treatment: 0 Activities of Daily Living Items Answer Activities of Daily Living (Please select one for each item) Drive Automobile Not Able Take Medications Need Assistance Use Telephone Need Assistance Care for Appearance Need Assistance Use Toilet Need Assistance Bath / Shower Need Assistance Dress Self Need Assistance Feed Self Need Assistance Walk Need Assistance Get In / Out Bed Need Assistance Housework Need Assistance Prepare Meals Need Assistance Handle Money  Need Assistance Shop for Self Need Assistance Electronic Signature(s) Signed: 03/10/2018 3:23:03 PM By: Roger Shelter Entered By: Roger Shelter on 03/10/2018 10:34:53 [name] ([id number]) -------------------------------------------------------------------------------- Education Assessment Details [name]. Date of Service: 03/10/2018 9:45 AM Medical Record Number: [id number] Patient Account Number: [id number] Date of Birth/Sex: Jun 16, 1934 (84 y.o. F) Treating RN: Roger Shelter Primary Care Fady Stamps: Loura Pardon Other Clinician: Referring Lazette Estala: Referral, Self Treating Kalub Morillo/Extender: Tito Dine in Treatment: 0 Primary Learner Assessed: Patient Learning Preferences/Education Level/Primary Language Learning Preference: Explanation Highest Education Level: High School Preferred Language: English Cognitive Barrier Assessment/Beliefs Language Barrier: No Translator Needed: No Memory Deficit: No Emotional Barrier: No Cultural/Religious Beliefs Affecting Medical Care: No Physical Barrier Assessment Impaired Vision: Yes Glasses Impaired Hearing: No Decreased Hand dexterity: No Knowledge/Comprehension Assessment Knowledge Level: High Comprehension Level: High Ability to understand written High instructions: Ability to understand verbal High instructions: Motivation Assessment Anxiety Level: Calm Cooperation: Cooperative Education Importance: Acknowledges Need Interest in Health Problems: Asks Questions Perception: Coherent Willingness to Engage in Self- High Management Activities: Readiness to Engage in Self- High Management Activities: Electronic Signature(s) Signed: 03/10/2018 3:23:03 PM By: Roger Shelter Entered By: Roger Shelter on 03/10/2018 10:35:18 [name] ([id number]) -------------------------------------------------------------------------------- Fall Risk Assessment Details [name]. Date of Service: 03/10/2018 9:45 AM Medical Record Number: [id number] Patient Account Number: [id number] Date of Birth/Sex: 1934-02-19 (84 y.o. F) Treating RN: Roger Shelter Primary Care Lilliann Rossetti: Loura Pardon Other Clinician: Referring Lis Savitt: Referral, Self Treating Labrian Torregrossa/Extender: Tito Dine in Treatment: 0 Fall Risk Assessment Items Have you had 2 or more falls in the last 12 monthso 0 Yes Have you had any fall that resulted in injury in the last 12 monthso 0 No FALL RISK ASSESSMENT: History of falling - immediate or within 3 months 0 No Secondary diagnosis 0 No Ambulatory aid None/bed rest/wheelchair/nurse 0 No Crutches/cane/walker 0 No Furniture 0  No IV Access/Saline Lock 0 No Gait/Training Normal/bed rest/immobile 0 No Weak 0 No Impaired 0 No Mental Status Oriented to own ability 0 No Electronic Signature(s) Signed: 03/10/2018 3:23:03 PM By: Roger Shelter Entered By: Roger Shelter on 03/10/2018 10:35:36 [name] ([id number]) -------------------------------------------------------------------------------- Foot Assessment Details [name]. Date of Service: 03/10/2018 9:45 AM Medical Record Number: [id number] Patient Account Number: [id number] Date of Birth/Sex: 07/23/1934 (84 y.o. F) Treating RN: Roger Shelter Primary Care Nylen Creque: Loura Pardon Other Clinician: Referring Wlliam Grosso: Referral, Self Treating Mitchel Delduca/Extender: Tito Dine in Treatment: 0 Foot Assessment Items Site Locations + = Sensation present, - = Sensation absent, C = Callus, U = Ulcer R = Redness, W = Warmth, M = Maceration, PU = Pre-ulcerative lesion F = Fissure, S = Swelling, D = Dryness Assessment Right: Left: Other Deformity: No No Prior Foot Ulcer: No No Prior Amputation: No No Charcot Joint: No No Ambulatory Status: Ambulatory With Help Assistance Device: Walker Gait: Steady Electronic Signature(s) Signed:  03/10/2018 3:23:03 PM By: Roger Shelter Entered By: Roger Shelter on 03/10/2018 10:39:14 [name] ([id number]) -------------------------------------------------------------------------------- Nutrition Risk Assessment Details [name]. Date of Service: 03/10/2018 9:45 AM Medical Record Number: [id number] Patient Account Number: [id number] Date of Birth/Sex: 04-05-1934 (84 y.o. F) Treating RN: Roger Shelter Primary Care Modupe Shampine: Loura Pardon Other Clinician: Referring Quintana Canelo: Referral, Self Treating Gurnoor Sloop/Extender: Tito Dine in Treatment: 0 Height (in): 66 Weight (lbs): Body Mass Index (BMI): Nutrition Risk Assessment Items NUTRITION RISK SCREEN: I have an illness or condition that made me change the kind and/or amount of 0 No food I eat I eat fewer than two meals per day 0 No I eat few fruits and vegetables, or milk products 0 No I have three or more drinks of beer, liquor or wine almost every day 0 No I have tooth or mouth problems that make it hard for me to eat 0 No I don't always have enough money to buy the food I need 0 No I eat alone most of the time 0 No I take three or more different prescribed or over-the-counter drugs a day 0 No Without wanting to, I have lost or gained 10 pounds in the last six months 0 No I am not always physically able to shop, cook and/or feed myself 0 No Nutrition Protocols Good Risk Protocol 0 No interventions needed Moderate Risk Protocol Electronic Signature(s) Signed: 03/10/2018 3:23:03 PM By: Roger Shelter Entered By: Roger Shelter on 03/10/2018 10:36:00

## 2018-03-12 NOTE — Progress Notes (Signed)
ASTARIA, NANEZ ([id number]) Visit Report for 03/10/2018 Allergy List Details [name], [name]. Date of Service: 03/10/2018 9:45 AM Medical Record Number: [id number] Patient Account Number: [id number] Date of Birth/Sex: [birth year] (84 y.o. F) Treating RN: [name] Primary Care [name]: [name] Other Clinician: Referring [name]: Referral, Self Treating [name]: [name] Weeks in Treatment: 0 Allergies Active Allergies Zithromax Sulfa (Sulfonamide Antibiotics) prednisone adhesive tape Allergy Notes Electronic Signature(s) Signed: 03/10/2018 3:23:03 PM By: [name] Entered By: [name] on 03/10/2018 10:28:29 [name] ([id number]) -------------------------------------------------------------------------------- Arrival Information Details [name]. Date of Service: 03/10/2018 9:45 AM Medical Record Number: [id number] Patient Account Number: [id number] Date of Birth/Sex: 12-10-1933 (84 y.o. F) Treating RN: [name] Primary Care [name]: [name] Other Clinician: Referring [name]: Referral, Self Treating [name]/Extender: [name] in Treatment: 0 Visit Information Patient Arrived: Walker Arrival Time: 10:25 Accompanied By: daughter Transfer Assistance: None Patient Identification Verified: Yes Secondary Verification Process Completed: Yes History Since Last Visit Electronic Signature(s) Signed: 03/10/2018 3:23:03 PM By: [name] Entered By: [name] on 03/10/2018 10:26:15 [name] ([id number]) -------------------------------------------------------------------------------- Clinic Level of Care Assessment Details [name]. Date of Service: 03/10/2018 9:45 AM Medical Record Number: [id number] Patient Account Number: [id number] Date of Birth/Sex: [birth year] (84 y.o. F) Treating RN: [name] Primary Care [name]: [name]  Other Clinician: Referring [name]: Referral, Self Treating [name]/Extender: [name] in Treatment: 0 Clinic Level of Care Assessment Items TOOL 1 Quantity Score []  - Use when EandM and Procedure is performed on INITIAL visit 0 ASSESSMENTS - Nursing Assessment / Reassessment X - General Physical Exam (combine w/ comprehensive assessment (listed just below) when 1 20 performed on new pt. evals) X- 1 25 Comprehensive Assessment (HX, ROS, Risk Assessments, Wounds Hx, etc.) ASSESSMENTS - Wound and Skin Assessment / Reassessment []  - Dermatologic / Skin Assessment (not related to wound area) 0 ASSESSMENTS - Ostomy and/or Continence Assessment and Care []  - Incontinence Assessment and Management 0 []  - 0 Ostomy Care Assessment and Management (repouching, etc.) PROCESS - Coordination of Care X - Simple Patient / Family Education for ongoing care 1 15 []  - 0 Complex (extensive) Patient / Family Education for ongoing care X- 1 10 Staff obtains Programmer, systems, Records, Test Results / Process Orders []  - 0 Staff telephones HHA, Nursing Homes / Clarify orders / etc []  - 0 Routine Transfer to another Facility (non-emergent condition) []  - 0 Routine Hospital Admission (non-emergent condition) X- 1 15 New Admissions / Biomedical engineer / Ordering NPWT, Apligraf, etc. []  - 0 Emergency Hospital Admission (emergent condition) PROCESS - Special Needs []  - Pediatric / Minor Patient Management 0 []  - 0 Isolation Patient Management []  - 0 Hearing / Language / Visual special needs []  - 0 Assessment of Community assistance (transportation, D/C planning, etc.) []  - 0 Additional assistance / Altered mentation []  - 0 Support Surface(s) Assessment (bed, cushion, seat, etc.) [name], [name] ([id number]) INTERVENTIONS - Miscellaneous []  - External ear exam 0 []  - 0 Patient Transfer (multiple staff / Civil Service fast streamer / Similar devices) []  - 0 Simple Staple / Suture removal (25 or  less) []  - 0 Complex Staple / Suture removal (26 or more) []  - 0 Hypo/Hyperglycemic Management (do not check if billed separately) X- 1 15 Ankle / Brachial Index (ABI) - do not check if billed separately Has the patient been seen at the hospital within the last three years: Yes Total Score: 100  Level Of Care: New/Established - Level 3 Electronic Signature(s) Signed: 03/10/2018 4:52:44 PM By: Gretta Cool, BSN, RN, CWS, Kim RN, BSN Entered By: Gretta Cool, BSN, RN, CWS, Kim on 03/10/2018 13:24:09 [name] ([id number]) -------------------------------------------------------------------------------- Encounter Discharge Information Details [name]. Date of Service: 03/10/2018 9:45 AM Medical Record Number: [id number] Patient Account Number: [id number] Date of Birth/Sex: 1934/04/02 (84 y.o. F) Treating RN: Montey Hora Primary Care Laqueshia Cihlar: [name] Other Clinician: Referring Biance Moncrief: Referral, Self Treating Anvi Mangal/Extender: [name] in Treatment: 0 Encounter Discharge Information Items Discharge Condition: Stable Ambulatory Status: Walker Discharge Destination: Home Transportation: Private Auto Accompanied By: dtr Schedule Follow-up Appointment: Yes Clinical Summary of Care: Electronic Signature(s) Signed: 03/10/2018 4:31:04 PM By: Montey Hora Entered By: Montey Hora on 03/10/2018 12:02:57 [name] ([id number]) -------------------------------------------------------------------------------- Lower Extremity Assessment Details [name]. Date of Service: 03/10/2018 9:45 AM Medical Record Number: [id number] Patient Account Number: [id number] Date of Birth/Sex: 1934-05-15 (84 y.o. F) Treating RN: [name] Primary Care Vinson Tietze: [name] Other Clinician: Referring Artavia Jeanlouis: Referral, Self Treating Syan Cullimore/Extender: [name] in Treatment: 0 Edema Assessment Assessed: [Left: No] [Right:  No] Edema: [Left: Ye] [Right: s] Vascular Assessment Claudication: Claudication Assessment [Left:None] Pulses: Dorsalis Pedis Palpable: [Left:No] Doppler Audible: [Left:Yes] Posterior Tibial Doppler Audible: [Left:Yes] Popliteal Doppler Audible: [Left:Yes] Extremity colors, hair growth, and conditions: Extremity Color: [Left:Normal] Hair Growth on Extremity: [Left:Yes] Temperature of Extremity: [Left:Cool] Capillary Refill: [Left:< 3 seconds] Blood Pressure: Brachial: [Left:118] Dorsalis Pedis: 110 [Left:Dorsalis Pedis:] Ankle: Posterior Tibial: 120 [Left:Posterior Tibial: 1.02] Toe Nail Assessment Left: Right: Thick: Yes Discolored: Yes Deformed: No Improper Length and Hygiene: No Electronic Signature(s) Signed: 03/10/2018 3:23:03 PM By: [name] Entered By: [name] on 03/10/2018 11:14:18 [name] ([id number]) -------------------------------------------------------------------------------- Multi Wound Chart Details [name]. Date of Service: 03/10/2018 9:45 AM Medical Record Number: [id number] Patient Account Number: [id number] Date of Birth/Sex: 06/29/1934 (84 y.o. F) Treating RN: [name] Primary Care Dewayne Severe: [name] Other Clinician: Referring Navin Dogan: Referral, Self Treating Merna Baldi/Extender: [name] in Treatment: 0 Vital Signs Height(in): 52 Pulse(bpm): 30 Weight(lbs): Blood Pressure(mmHg): 133/59 Body Mass Index(BMI): Temperature(F): 97.7 Respiratory Rate 18 (breaths/min): Photos: [6:No Photos] [7:No Photos] [8:No Photos] Wound Location: [6:Left Knee] [7:Left Lower Leg - Medial] [8:Left Knee - Posterior] Wounding Event: [6:Gradually Appeared] [7:Gradually Appeared] [8:Surgical Injury] Primary Etiology: [6:To be determined] [7:To be determined] [8:Open Surgical Wound] Comorbid History: [6:Cataracts, Sleep Apnea, Arrhythmia, Hypertension, Scleroderma] [7:Cataracts, Sleep Apnea,  Arrhythmia, Hypertension, Scleroderma] [8:Cataracts, Sleep Apnea, Arrhythmia, Hypertension, Scleroderma] Date Acquired: [6:02/24/2018] [7:02/24/2018] [8:02/24/2018] Weeks of Treatment: [6:0] [7:0] [8:0] Wound Status: [6:Open] [7:Open] [8:Open] Measurements L x W x D [6:0.8x1.2x0.1] [7:1.4x1.1x0.1] [8:0.3x0.3x0.2] (cm) Area (cm) : [6:0.754] [7:1.21] [8:0.071] Volume (cm) : [6:0.075] [7:0.121] [8:0.014] Classification: [6:Partial Thickness] [7:Full Thickness Without Exposed Support Structures] [8:Partial Thickness] Exudate Amount: [6:Medium] [7:Medium] [8:Large] Exudate Type: [6:Serosanguineous] [7:Serous] [8:Serous] Exudate Color: [6:red, brown] [7:amber] [8:amber] Wound Margin: [6:Distinct, outline attached] [7:Distinct, outline attached] [8:Distinct, outline attached] Granulation Amount: [6:Medium (34-66%)] [7:None Present (0%)] [8:None Present (0%)] Granulation Quality: [6:Red] [7:N/A] [8:N/A] Necrotic Amount: [6:Medium (34-66%)] [7:Large (67-100%)] [8:None Present (0%)] Necrotic Tissue: [6:Eschar, Adherent Slough] [7:Adherent Slough] [8:N/A] Exposed Structures: [6:Fascia: No Fat Layer (Subcutaneous Tissue) Exposed: No Tendon: No Muscle: No Joint: No Bone: No] [7:Fat Layer (Subcutaneous Tissue) Exposed: Yes Fascia: No Tendon: No Muscle: No Joint: No Bone: No] [8:Fascia: No Fat Layer (Subcutaneous Tissue)  Exposed: No Tendon: No Muscle: No Joint: No Bone: No] Epithelialization: [6:Small (1-33%)] [7:Small (  1-33%)] [8:None] Periwound Skin Texture: [6:Excoriation: Yes Induration: No Callus: No Crepitus: No] [7:Excoriation: No Induration: No Callus: No Crepitus: No] [8:Excoriation: No Induration: No Callus: No Crepitus: No] Rash: No Rash: No Rash: No Scarring: No Scarring: No Scarring: No Periwound Skin Moisture: Maceration: No Maceration: No Maceration: No Dry/Scaly: No Dry/Scaly: No Dry/Scaly: No Periwound Skin Color: Atrophie Blanche: No Atrophie Blanche: No Atrophie Blanche:  No Cyanosis: No Cyanosis: No Cyanosis: No Ecchymosis: No Ecchymosis: No Ecchymosis: No Erythema: No Erythema: No Erythema: No Hemosiderin Staining: No Hemosiderin Staining: No Hemosiderin Staining: No Mottled: No Mottled: No Mottled: No Pallor: No Pallor: No Pallor: No Rubor: No Rubor: No Rubor: No Temperature: No Abnormality No Abnormality No Abnormality Tenderness on Palpation: Yes Yes Yes Wound Preparation: Ulcer Cleansing: Ulcer Cleansing: Ulcer Cleansing: Rinsed/Irrigated with Saline Rinsed/Irrigated with Saline Rinsed/Irrigated with Saline Topical Anesthetic Applied: Topical Anesthetic Applied: Other: lidocaine 4% Other: lidocaine 4% Assessment Notes: N/A N/A states area was lanced several months ago and has been draining yellow , states MD wishes for it to continue to drain due t body has been trying to resist hardware from knee surgery, Treatment Notes Electronic Signature(s) Signed: 03/10/2018 4:52:44 PM By: Gretta Cool, BSN, RN, CWS, Kim RN, BSN Entered By: Gretta Cool, BSN, RN, CWS, Kim on 03/10/2018 11:12:46 [name] ([id number]) -------------------------------------------------------------------------------- [place] Details [name], [name]. Date of Service: 03/10/2018 9:45 AM Medical Record Number: [id number] Patient Account Number: [id number] Date of Birth/Sex: 08-04-1934 (84 y.o. F) Treating RN: [name] Primary Care Khyle Goodell: [name] Other Clinician: Referring Madyson Lukach: Referral, Self Treating Veda Arrellano/Extender: [name] in Treatment: 0 Active Inactive ` Abuse / Safety / Falls / Self Care Management Nursing Diagnoses: History of Falls Goals: Patient will not experience any injury related to falls Date Initiated: 03/10/2018 Target Resolution Date: 04/09/2018 Goal Status: Active Patient will remain injury free related to falls Date Initiated: 03/10/2018 Target Resolution Date: 04/09/2018 Goal  Status: Active Interventions: Assess fall risk on admission and as needed Notes: ` Orientation to the Wound Care Program Nursing Diagnoses: Knowledge deficit related to the wound healing center program Goals: Patient/caregiver will verbalize understanding of the [place] Date Initiated: 03/10/2018 Target Resolution Date: 03/22/2018 Goal Status: Active Interventions: Provide education on orientation to the wound center Notes: ` Venous Leg Ulcer Nursing Diagnoses: Knowledge deficit related to disease process and management Goals: Patient will maintain optimal edema control Date Initiated: 03/10/2018 Target Resolution Date: 03/26/2018 [name], [name]. ([id number]) Goal Status: Active Interventions: Assess peripheral edema status every visit. Notes: ` Wound/Skin Impairment Nursing Diagnoses: Impaired tissue integrity Goals: Ulcer/skin breakdown will have a volume reduction of 50% by week 8 Date Initiated: 03/10/2018 Target Resolution Date: 05/10/2018 Goal Status: Active Interventions: Assess ulceration(s) every visit Treatment Activities: Skin care regimen initiated : 03/10/2018 Topical wound management initiated : 03/10/2018 Notes: Electronic Signature(s) Signed: 03/10/2018 1:25:53 PM By: Gretta Cool, BSN, RN, CWS, Kim RN, BSN Entered By: Gretta Cool, BSN, RN, CWS, Kim on 03/10/2018 13:25:53 [name], [name] ([id number]) -------------------------------------------------------------------------------- Pain Assessment Details [name]. Date of Service: 03/10/2018 9:45 AM Medical Record Number: [id number] Patient Account Number: [id number] Date of Birth/Sex: 09/19/1934 (84 y.o. F) Treating RN: [name] Primary Care Treylen Gibbs: [name] Other Clinician: Referring Jami Bogdanski: Referral, Self Treating Nisa Decaire/Extender: [name] in Treatment: 0 Active Problems Location of Pain Severity and Description of Pain Patient Has Paino  Patient Unable to Respond Site Locations Duration of the Pain. Constant / Intermittento Intermittent Rate the  pain. Current Pain Level: 7 Character of Pain Describe the Pain: Aching Pain Management and Medication Current Pain Management: Notes aleve for pain prn, states mostly hurts with sitting and standing Electronic Signature(s) Signed: 03/10/2018 3:23:03 PM By: [name] Entered By: [name] on 03/10/2018 10:26:57 [name] ([id number]) -------------------------------------------------------------------------------- Patient/Caregiver Education Details [name]. Date of Service: 03/10/2018 9:45 AM Medical Record Number: [id number] Patient Account Number: [id number] Date of Birth/Gender: 07/11/1934 (84 y.o. F) Treating RN: Montey Hora Primary Care Physician: [name] Other Clinician: Referring Physician: Referral, Self Treating Physician/Extender: [name] in Treatment: 0 Education Assessment Education Provided To: Patient and Caregiver Education Topics Provided Wound/Skin Impairment: Handouts: Other: wound care as ordered Methods: Demonstration, Explain/Verbal Responses: State content correctly Electronic Signature(s) Signed: 03/10/2018 4:31:04 PM By: Montey Hora Entered By: Montey Hora on 03/10/2018 12:03:16 [name] ([id number]) -------------------------------------------------------------------------------- Wound Assessment Details [name]. Date of Service: 03/10/2018 9:45 AM Medical Record Number: [id number] Patient Account Number: [id number] Date of Birth/Sex: 01-21-1934 (84 y.o. F) Treating RN: [name] Primary Care Denelda Akerley: [name] Other Clinician: Referring Jerrelle Michelsen: Referral, Self Treating Dyland Panuco/Extender: [name] in Treatment: 0 Wound Status Wound Number: 6 Primary To be determined Etiology: Wound Location: Left Knee Wound Status:  Open Wounding Event: Gradually Appeared Comorbid Cataracts, Sleep Apnea, Arrhythmia, Date Acquired: 02/24/2018 History: Hypertension, Scleroderma Weeks Of Treatment: 0 Clustered Wound: No Photos Photo Uploaded By: [name] on 03/10/2018 15:35:29 Wound Measurements Length: (cm) 0.8 Width: (cm) 1.2 Depth: (cm) 0.1 Area: (cm) 0.754 Volume: (cm) 0.075 % Reduction in Area: % Reduction in Volume: Epithelialization: Small (1-33%) Tunneling: No Undermining: No Wound Description Classification: Partial Thickness Wound Margin: Distinct, outline attached Exudate Amount: Medium Exudate Type: Serosanguineous Exudate Color: red, brown Foul Odor After Cleansing: No Slough/Fibrino Yes Wound Bed Granulation Amount: Medium (34-66%) Exposed Structure Granulation Quality: Red Fascia Exposed: No Necrotic Amount: Medium (34-66%) Fat Layer (Subcutaneous Tissue) Exposed: No Necrotic Quality: Eschar, Adherent Slough Tendon Exposed: No Muscle Exposed: No Joint Exposed: No Bone Exposed: No Periwound Skin Texture [name], [name] ([id number]) Texture Color No Abnormalities Noted: No No Abnormalities Noted: No Callus: No Atrophie Blanche: No Crepitus: No Cyanosis: No Excoriation: Yes Ecchymosis: No Induration: No Erythema: No Rash: No Hemosiderin Staining: No Scarring: No Mottled: No Pallor: No Moisture Rubor: No No Abnormalities Noted: No Dry / Scaly: No Temperature / Pain Maceration: No Temperature: No Abnormality Tenderness on Palpation: Yes Wound Preparation Ulcer Cleansing: Rinsed/Irrigated with Saline Topical Anesthetic Applied: Other: lidocaine 4%, Treatment Notes Wound #6 (Left Knee) 1. Cleansed with: Clean wound with Normal Saline 2. Anesthetic Topical Lidocaine 4% cream to wound bed prior to debridement 4. Dressing Applied: Prisma Ag 5. Secondary [place] Signature(s) Signed: 03/10/2018 3:23:03 PM By: [name] Entered By: [name] on 03/10/2018 10:42:31 [name] ([id number]) -------------------------------------------------------------------------------- Wound Assessment Details [name]. Date of Service: 03/10/2018 9:45 AM Medical Record Number: [id number] Patient Account Number: [id number] Date of Birth/Sex: [birth year] (84 y.o. F) Treating RN: [name] Primary Care Rafel Garde: [name] Other Clinician: Referring Belinda Schlichting: Referral, Self Treating Roscoe Witts/Extender: [name] in Treatment: 0 Wound Status Wound Number: 7 Primary To be determined Etiology: Wound Location: Left Lower Leg - Medial Wound Status: Open Wounding Event: Gradually Appeared Comorbid Cataracts, Sleep Apnea, Arrhythmia, Date Acquired: 02/24/2018 History: Hypertension, Scleroderma Weeks Of Treatment: 0 Clustered Wound: No Photos Photo Uploaded By: [name] on 03/10/2018 15:35:30 Wound Measurements  Length: (cm) 1.4 Width: (cm) 1.1 Depth: (cm) 0.1 Area: (cm) 1.21 Volume: (cm) 0.121 % Reduction in Area: % Reduction in Volume: Epithelialization: Small (1-33%) Tunneling: No Undermining: No Wound Description Full Thickness Without Exposed Support Classification: Structures Wound Margin: Distinct, outline attached Exudate Medium Amount: Exudate Type: Serous Exudate Color: amber Foul Odor After Cleansing: No Slough/Fibrino Yes Wound Bed Granulation Amount: None Present (0%) Exposed Structure Necrotic Amount: Large (67-100%) Fascia Exposed: No Necrotic Quality: Adherent Slough Fat Layer (Subcutaneous Tissue) Exposed: Yes Tendon Exposed: No Muscle Exposed: No Joint Exposed: No Bone Exposed: No [name], [name] ([id number]) Periwound Skin Texture Texture Color No Abnormalities Noted: No No Abnormalities Noted: No Callus: No Atrophie Blanche: No Crepitus: No Cyanosis: No Excoriation: No Ecchymosis: No Induration:  No Erythema: No Rash: No Hemosiderin Staining: No Scarring: No Mottled: No Pallor: No Moisture Rubor: No No Abnormalities Noted: No Dry / Scaly: No Temperature / Pain Maceration: No Temperature: No Abnormality Tenderness on Palpation: Yes Wound Preparation Ulcer Cleansing: Rinsed/Irrigated with Saline Topical Anesthetic Applied: Other: lidocaine 4%, Treatment Notes Wound #7 (Left, Medial Lower Leg) 1. Cleansed with: Clean wound with Normal Saline 2. Anesthetic Topical Lidocaine 4% cream to wound bed prior to debridement 4. Dressing Applied: Prisma Ag 5. Secondary [place] Signature(s) Signed: 03/10/2018 3:23:03 PM By: [name] Entered By: [name] on 03/10/2018 10:45:40 [name] ([id number]) -------------------------------------------------------------------------------- Wound Assessment Details [name]. Date of Service: 03/10/2018 9:45 AM Medical Record Number: [id number] Patient Account Number: [id number] Date of Birth/Sex: 03/31/1934 (84 y.o. F) Treating RN: [name] Primary Care Jiyan Walkowski: [name] Other Clinician: Referring Curtisha Bendix: Referral, Self Treating Esly Selvage/Extender: [name] in Treatment: 0 Wound Status Wound Number: 8 Primary Open Surgical Wound Etiology: Wound Location: Left Knee - Posterior Wound Status: Open Wounding Event: Surgical Injury Comorbid Cataracts, Sleep Apnea, Arrhythmia, Date Acquired: 02/24/2018 History: Hypertension, Scleroderma Weeks Of Treatment: 0 Clustered Wound: No Photos Photo Uploaded By: [name] on 03/10/2018 15:35:50 Wound Measurements Length: (cm) 0.3 Width: (cm) 0.3 Depth: (cm) 0.2 Area: (cm) 0.071 Volume: (cm) 0.014 % Reduction in Area: % Reduction in Volume: Epithelialization: None Tunneling: No Undermining: No Wound Description Classification: Partial Thickness Wound Margin: Distinct, outline  attached Exudate Amount: Large Exudate Type: Serous Exudate Color: amber Foul Odor After Cleansing: No Slough/Fibrino No Wound Bed Granulation Amount: None Present (0%) Exposed Structure Necrotic Amount: None Present (0%) Fascia Exposed: No Fat Layer (Subcutaneous Tissue) Exposed: No Tendon Exposed: No Muscle Exposed: No Joint Exposed: No Bone Exposed: No Periwound Skin Texture [name], [name] ([id number]) Texture Color No Abnormalities Noted: No No Abnormalities Noted: No Callus: No Atrophie Blanche: No Crepitus: No Cyanosis: No Excoriation: No Ecchymosis: No Induration: No Erythema: No Rash: No Hemosiderin Staining: No Scarring: No Mottled: No Pallor: No Moisture Rubor: No No Abnormalities Noted: No Dry / Scaly: No Temperature / Pain Maceration: No Temperature: No Abnormality Tenderness on Palpation: Yes Wound Preparation Ulcer Cleansing: Rinsed/Irrigated with Saline Assessment Notes states area was lanced several months ago and has been draining yellow , states MD wishes for it to continue to drain due t body has been trying to resist hardware from knee surgery, Electronic Signature(s) Signed: 03/10/2018 3:23:03 PM By: [name] Entered By: [name] on 03/10/2018 10:51:16 [name] ([id number]) -------------------------------------------------------------------------------- Vitals Details [name]. Date of Service: 03/10/2018 9:45 AM Medical Record Number: [id number] Patient Account Number: [id number] Date of Birth/Sex: 1934-05-13 (84 y.o. F) Treating RN: [name]  Primary Care Salaya Holtrop: [name] Other Clinician: Referring Wenona [place]: Referral, Self Treating Teal Raben/Extender: [name] in Treatment: 0 Vital Signs Time Taken: 10:26 Temperature (F): 97.7 Height (in): 66 Pulse (bpm): 76 Source: Stated Respiratory Rate (breaths/min): 18 Blood Pressure (mmHg): 133/59 Reference Range: 80 -  120 mg / dl Electronic Signature(s) Signed: 03/10/2018 3:23:03 PM By: [name] Entered By: [name] on 03/10/2018 10:27:43

## 2018-03-12 NOTE — Progress Notes (Signed)
MAYZIE, CAUGHLIN ([id number]) Visit Report for 03/10/2018 Chief Complaint Document Details Patient Name: TASHEENA, WAMBOLT. Date of Service: 03/10/2018 9:45 AM Medical Record Number: [id number] Patient Account Number: [id number] Date of Birth/Sex: [birth year] (84 y.o. F) Treating RN: Cornell Barman Primary Care Provider: Loura Pardon Other Clinician: Referring Provider: Referral, Self Treating Provider/Extender: Tito Dine in Treatment: 0 Information Obtained from: Patient Chief Complaint 10/28/16; patient is here for review of a draining area in her left popliteal fossa that is been present for 8 days 04/07/17; patient is here for review of a draining area in her left popliteal fossa that we have seen her for previously in this clinic. She also has an excoriated area on the left anterior patella. 07/15/17; patient is here for review of a draining area and her left popliteal fossa that opened up a week ago for which we have previously seen her in this clinic. She also has an excoriated area on the left anterior patella also not new 03/14/17; patient is here for review of superficial wounds on the left patella and left lower leg just below the tibial tuberosity. She has a continued draining area in the left popliteal fossa Electronic Signature(s) Signed: 03/10/2018 4:50:03 PM By: Linton Ham MD Entered By: Linton Ham on 03/10/2018 15:46:29 Wolin, Blima Singer ([id number]) -------------------------------------------------------------------------------- Debridement Details Patient Name: [name]. Date of Service: 03/10/2018 9:45 AM Medical Record Number: [id number] Patient Account Number: [id number] Date of Birth/Sex: [birth year] (84 y.o. F) Treating RN: Cornell Barman Primary Care Provider: Loura Pardon Other Clinician: Referring Provider: Referral, Self Treating Provider/Extender: Tito Dine in Treatment: 0 Debridement Performed for Wound #7 Left,Medial Lower  Leg Assessment: Performed By: Physician Ricard Dillon, MD Debridement Type: Debridement Pre-procedure Verification/Time Yes - 11:16 Out Taken: Start Time: 11:16 Pain Control: Other : lidocaine 4% Total Area Debrided (L x W): 1.4 (cm) x 1.1 (cm) = 1.54 (cm) Tissue and other material Viable, Non-Viable, Subcutaneous, Skin: Dermis debrided: Level: Skin/Subcutaneous Tissue Debridement Description: Excisional Instrument: Curette Bleeding: Minimum Hemostasis Achieved: Pressure End Time: 11:18 Procedural Pain: 0 Post Procedural Pain: 0 Response to Treatment: Procedure was tolerated well Level of Consciousness: Awake and Alert Post Procedure Vitals: Temperature: 97.7 Pulse: 76 Respiratory Rate: 16 Blood Pressure: Systolic Blood Pressure: 893 Diastolic Blood Pressure: 59 Post Debridement Measurements of Total Wound Length: (cm) 1.4 Width: (cm) 1.1 Depth: (cm) 0.1 Volume: (cm) 0.121 Character of Wound/Ulcer Post Debridement: Requires Further Debridement Post Procedure Diagnosis Same as Pre-procedure Electronic Signature(s) Signed: 03/10/2018 4:50:03 PM By: Linton Ham MD Signed: 03/10/2018 4:52:44 PM By: Gretta Cool, BSN, RN, CWS, Kim RN, BSN Previous Signature: 03/10/2018 1:41:09 PM Version By: Gretta Cool, BSN, RN, CWS, Kim RN, BSN Entered By: Linton Ham on 03/10/2018 14:09:57 Lenderman, Blima Singer ([id number]) Rogers Blocker, Blima Singer ([id number]) -------------------------------------------------------------------------------- HPI Details Patient Name: CHRISTIAN, TREADWAY. Date of Service: 03/10/2018 9:45 AM Medical Record Number: [id number] Patient Account Number: [id number] Date of Birth/Sex: 1933/12/11 (84 y.o. F) Treating RN: Cornell Barman Primary Care Provider: Loura Pardon Other Clinician: Referring Provider: Referral, Self Treating Provider/Extender: Tito Dine in Treatment: 0 History of Present Illness HPI Description: 10/28/16; this is an 84 year old woman who arrives  with a complicated medical issue. 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 Initially in 2000 and apparently most recently in 2014. 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 Indeed she was hospitalized on 02/13/16 and discharged on 02/19/16. She was taken to the OR and it  was found that the popliteal abscess was communicating with her joint space. Signed to feel fluid grew MSSA. Patient was treated with IandD of the abscess. She completed IV cefazolin on 03/28/16 and since then has been on Keflex and rifampin for chronic suppression. Her infectious disease doctor is Dr.Shrestha at [facility]. The patient was seen in our [place] sister clinic through much of October and November 2017. At that point she had a draining sinus that eventually closed over. She noted some pain behind her knee surrounding Christmas day and was seen in our emergency room on 10/23/16. During this ER visit she underwent tendon incision and drainage of a left Baker's cyst and clear colored synovial fluid was obtained. Doxycycline was added for a week to her Keflex and rifampin. A CT scan was ordered but I don't think was ever done due to artifacts from hardware. Her wound culture was negative. She has been using iodoform packing. She states her knee is painful when she stands on it for a period of time. She is not systemically unwell. She has a history of scleroderma or without is been under control for some period of time. 11/04/16 the culture that I did of this area last week showed coag-negative staph and a few enterococcus faecium. Enterococcus is ampicillin resistant. The coag negative staph is only sensitive to vancomycin and tetracycline. Previous joint infection was methicillin sensitive staph aureus. It seems I misunderstood what she said and that her infectious disease clinic appointment is this Friday and the orthopedic appointment was last Friday. I faxed the culture  results to the infectious disease clinic but I've given him a copy of it today. Per the patient and her family they orthopedic surgeon did not want to do anything further to this joint. The patient is not systemically unwell in particular no fever or chills and the pain she has is really minimal 11/18/16; this is a patient who has an infected left knee prosthetic joint. She went to see infectious disease at [facility] who apparently did did not recommend any further antibiotic therapy. She continues to feel the same episodic pain but no systemic symptoms of particular no fever. The depth of the wound in the popliteal fossa of her left knee is 3.5 versus 5 cm last week 11/25/16 4.5 cm of depth today. Draining exit site. We have treating this with iodoform packing however I really have no expectation of healing and to be truthful the amount of drainage that is coming out of this knee I don't think closure of this site is necessarily considered a positive outcome. Her left knee remains warm but not painful there is an effusion she has no systemic symptoms no fever no chills or and she feels well 12/31/16. Still is same amount of depth today using a skinny at 4.6 cm. There is less drainage. We have been using iodoform packing and I really have no expectation of healing and to be truthful closing this sinus probably is really not the best thing to consider. 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 She is not felt to be a candidate for a fourth replacement. In the meantime she does not complain of pain except when the dressing is being changed episodically. She has not been systemically unwell no fever or chills or other issues. 01/14/17; after the patient left on 3/7 her daughter tells me the next day the wound had closed over and therefore this is not intact since. Over the last 3 days  the patient has noted significant pain in the popliteal fossa of the involved left knee. The patient has  a chronically infected left total knee replacement and is on Keflex and rifampin directed against MSSA. MSSA was the cause of the original identified joint infection treated at [facility]. 01/21/17; I saw this patient last week at which time there was intense erythema in the popliteal fossa, tenderness and a ballotable fluid collection. I did an IandD on this. Surprisingly the culture of this was negative although as a result of the IandD her tenderness and erythema in the area is resolved. We continue with the iodoform packing READMISSION 04/07/17; this is a patient I know from 2 prior admissions to this clinic starting in January of this year. Her history is essentially as noted above. 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 She has a draining sinus into the popliteal fossa of the left knee. This remains open and drains. She has been using iodoform packing to this area with gauze over the top. To be truthful I haven't really been certain whether having this area close would be helpful or harmful to this patient. Previously when it's closed she develops a lot more pain in [place], MIRAYA CUDNEY. ([id number]) the back of the popliteal fossa and more pain in the knee. She is here today because the packing of the area has become increasingly painful in fact the patient describes this as a "torture chamber" experience. They are packing this wound twice a day. The patient follows with orthopedic surgery and infectious disease at [facility] although she hasn't seen them since she was last here in March. She denies any systemic symptoms including fever chills etc. She states except for the changing of the dressing her pain when she is mobilizing on the knee is 2/10. She also has a area on the left anterior patella in the incision that is excoriated open. The patient states this is itchy and she does a lot of scratching. They state this was  here last time she was here but I have no recollection or documentation of this 04/21/17; patient was readmitted to clinic 2 weeks ago. She has a draining sinus in the left popliteal fossa and a chronically infected left total knee replacement. She also had an excoriation on the left anterior knee. She arrives in clinic today with all of this looking considerably better. The divot in the popliteal fossa is actually closed. READMISSION 07/15/17; the patient arrived today with a real opening of the draining sinus in her left popliteal fossa. This opened about a week ago after developing a painful "knot". Is been draining clear fluid. The patient's pain is a lot better. She is not been systemically unwell. oShe also is been dealing with an area on the left anterior leg just below the tibial plateau. She also had this last time. Her daughter states that she scratches this area continuously. I have reviewed the last infectious disease note from August. 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 Much to my chagrin they seem reluctant to tap the joint. A culture of purulent material that came out of the popliteal fossa earlier this year cultured coag-negative staph aureus [not an uncommon cause of prosthetic joint infection]. However they really ignored this and they state that the source was questionable. If they believe this I am wondering why they're so reluctant to  tap the fluid. She has not seen orthopedics 07/22/17; patient's sinus tract in the popliteal fossa about 2.5 cm. The area on the anterior part of her knee looks a lot better. We have been using iodoform packing to the popliteal fossa, collagen to the anterior part of her knee. She is not experiencing undue pain or systemic symptoms 08/05/17; patient has a sinus tract in the popliteal fossa on the left. This is part of a chronic infection and a left total  knee replacement. She does not have any options for further replacements she is only been offered an above-knee amputation. 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 She has not been systemically unwell no fever. There is some pain and swelling in the knee but all in all this is not unbearable. They've been using iodoform packing 09/02/17; continued sinus tract on the popliteal fossa on the left as part of a chronic infection on a left total knee replacement. She follows with infectious disease at [facility] although the patient and her daughter are not exactly sure when her next appointment is. She has not been systemically unwell specifically denies fever chills. She has some pain in the left knee however she is reluctant to take even simple analgesics because of the risk of" addiction" 09/30/17; continued sinus tract in the popliteal fossa on the left as part of a chronic infection on the left total knee replacement. She has not been following recently with infectious disease at [facility]. She has some pain in the left knee although this is not major and not changed over the last recent months. Depth of the probing area is 1.9 cm READMISSION 5/50/19; this is a patient we have seen in this clinic several times before predominantly related to a draining sinus tract in the left popliteal fossa as part of a chronic infection in the left total knee replacement. She sought infectious disease in February. A discontinue doxycycline she was on at the time and continued her on long-standing prophylactic cephalexin which she is still on. 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 I see she was seen by pulmonary in April with chronic respiratory failure with hypoxia and cor pulmonale and pulmonary hypertension. She comes clinic this time with the sudden onset of 2 small wounds on the right anterior patella and the right anterior lower leg just below the  tibial tuberosity. It is not really clear how this happened although presumably minor trauma that the patient didn't recognize. Her daughter states that she simply noticed blood on the sheets. She may have picked at her skin as well. I believe we've had superficial wounds on the patella previously in this clinic didn't seem to heal over fairly easily. The patient has not been systemically unwell no fever no chills she does not have a lot of pain in the knee just occasionally when she is up on it Electronic Signature(s) Signed: 03/10/2018 4:50:03 PM By: Linton Ham MD [name] ([id number]) Entered By: Linton Ham on 03/10/2018 15:59:25 [name] ([id number]) -------------------------------------------------------------------------------- Physical Exam Details Patient Name: [name]. Date of Service: 03/10/2018 9:45 AM Medical Record Number: [id number] Patient Account Number: [id number] Date of Birth/Sex: September 03, 1934 (84 y.o. F) Treating RN: Cornell Barman Primary Care Provider: Loura Pardon Other Clinician: Referring Provider: Referral, Self Treating Provider/Extender: Tito Dine in Treatment: 0 Constitutional Sitting or standing Blood Pressure is within target range for patient.. Pulse regular and within target range for  patient.Marland Kitchen Respirations regular, non-labored and within target range.. Temperature is normal and within the target range for the patient.Marland Kitchen appears in no distress. Eyes Conjunctivae clear. No discharge. Respiratory Respiratory effort is easy and symmetric bilaterally. Rate is normal at rest and on room air.. Cardiovascular Pedal pulses palpable and strong bilaterally.. Lymphatic none palpable in the left popliteal or inguinal area. Musculoskeletal marked swelling of the left knee perhaps a small effusion. Tender along the joint lines. Psychiatric No evidence of depression, anxiety, or agitation. Calm, cooperative, and communicative.  Appropriate interactions and affect.. Notes wound exam; #1 the original draining sinus tract in the popliteal fossa is still open and probably should remain so. It drains yellowish-looking synovial fluid #2 small superficial areas over the left patella and left anterior tibia just below the tibial tuberosity. Significant surface eschar removed from these with a #3 curet. These cleanup quite neat nicely hemostasis with direct pressure Electronic Signature(s) Signed: 03/10/2018 4:50:03 PM By: Linton Ham MD Entered By: Linton Ham on 03/10/2018 16:05:18 [name] ([id number]) -------------------------------------------------------------------------------- Physician Orders Details Patient Name: [name]. Date of Service: 03/10/2018 9:45 AM Medical Record Number: [id number] Patient Account Number: [id number] Date of Birth/Sex: 1934-02-05 (84 y.o. F) Treating RN: Cornell Barman Primary Care Provider: Loura Pardon Other Clinician: Referring Provider: Referral, Self Treating Provider/Extender: Tito Dine in Treatment: 0 Verbal / Phone Orders: No Diagnosis Coding Wound Cleansing Wound #6 Left Knee o Cleanse wound with mild soap and water Wound #7 Left,Medial Lower Leg o Cleanse wound with mild soap and water Wound #8 Left,Posterior Knee o Cleanse wound with mild soap and water Anesthetic (add to Medication List) Wound #6 Left Knee o Topical Lidocaine 4% cream applied to wound bed prior to debridement (In Clinic Only). Wound #7 Left,Medial Lower Leg o Topical Lidocaine 4% cream applied to wound bed prior to debridement (In Clinic Only). Wound #8 Left,Posterior Knee o Topical Lidocaine 4% cream applied to wound bed prior to debridement (In Clinic Only). Primary Wound Dressing Wound #6 Left Knee o Silver Collagen Wound #7 Left,Medial Lower Leg o Silver Collagen Secondary Dressing Wound #6 Left Knee o [place] Wound #7 Left,Medial Lower  Leg o [place] #8 [place] Dressing Change Frequency Wound #6 Left Knee o Change dressing every other day. Wound #7 Left,Medial Lower Leg o Change dressing every other day. KETINA, MARS. ([id number]) Wound #8 Left,Posterior Knee o Change dressing every other day. Follow-up Appointments Wound #6 Left Knee o Return Appointment in 1 week. Wound #7 Left,Medial Lower Leg o Return Appointment in 1 week. Wound #8 Left,Posterior Knee o Return Appointment in 1 week. Electronic Signature(s) Signed: 03/10/2018 4:50:03 PM By: Linton Ham MD Signed: 03/10/2018 4:52:44 PM By: Gretta Cool, BSN, RN, CWS, Kim RN, BSN Entered By: Gretta Cool, BSN, RN, CWS, Kim on 03/10/2018 11:21:49 JAIDYN, USERY ([id number]) -------------------------------------------------------------------------------- Problem List Details Patient Name: JAHARA, DAIL. Date of Service: 03/10/2018 9:45 AM Medical Record Number: [id number] Patient Account Number: [id number] Date of Birth/Sex: Mar 02, 1934 (84 y.o. 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 Date of Service: 03/10/2018 9:45 AM Medical Record Number: [id number] Patient Account  Number: [id number] Date of Birth/Sex: 1934-09-30 (84 y.o. F) Treating RN: Cornell Barman Primary Care Provider: Loura Pardon Other Clinician: Referring Provider: Referral, Self Treating Provider/Extender: Tito Dine in Treatment: 0 Subjective Chief Complaint Information obtained from Patient 10/28/16; patient is here for review of a draining area in her left popliteal fossa that is been present for 8 days 04/07/17; patient is here for review of a draining area in her left popliteal fossa that we have seen her for previously in this clinic. She also has an excoriated area on the left anterior patella. 07/15/17; patient is here for review of a draining area and her left popliteal fossa that opened up a week ago for which we have previously seen her in this clinic. She also has an excoriated area on the left anterior patella also not new 03/14/17; patient is here for review of superficial wounds on the left patella and left lower leg just below the tibial tuberosity. She has a continued draining area in the left popliteal fossa History of Present Illness (HPI) 10/28/16; this is an 84 year old woman who arrives with a complicated medical issue. 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 Initially in 2000 and apparently most recently in 2014. 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 Indeed she was hospitalized on 02/13/16 and discharged on 02/19/16. She was taken to the OR and it was found that the popliteal abscess was communicating with her joint space. Signed to feel fluid grew MSSA. Patient was treated with IandD of the abscess. She completed IV cefazolin on 03/28/16 and since then has been on Keflex and rifampin for chronic suppression. Her infectious disease doctor is Dr.Shrestha at [facility]. The patient was seen in our [place] sister clinic through much of October and November 2017. At that point she had a draining sinus that eventually  closed over. She noted some pain behind her knee surrounding Christmas day and was seen in our emergency room on 10/23/16. During this ER visit she underwent tendon incision and drainage of a left Baker's cyst and clear colored synovial fluid was obtained. Doxycycline was added for a week to her Keflex and rifampin. A CT scan was ordered but I don't think was ever done due to artifacts from hardware. Her wound culture was negative. She has been using iodoform packing. She states her knee is painful when she stands on it for a period of time. She is not systemically unwell. She has a history of scleroderma or without is been under control for some period of time. 11/04/16 the culture that I did of this area last week showed coag-negative staph and a few enterococcus faecium. Enterococcus is ampicillin resistant. The coag negative staph is only sensitive to vancomycin and tetracycline. Previous joint infection was methicillin sensitive staph aureus. It seems I misunderstood what she said and that her infectious disease clinic appointment is this Friday and the orthopedic appointment was last Friday. I faxed the culture results to the infectious disease clinic but I've given him a copy of it today. Per the patient and her family they orthopedic surgeon did not want to do anything further to this joint. The patient is not systemically unwell in particular no fever or chills and the pain she has is really minimal 11/18/16; this  is a patient who has an infected left knee prosthetic joint. She went to see infectious disease at [facility] who apparently did did not recommend any further antibiotic therapy. She continues to feel the same episodic pain but no systemic symptoms of particular no fever. The depth of the wound in the popliteal fossa of her left knee is 3.5 versus 5 cm last week 11/25/16 4.5 cm of depth today. Draining exit site. We have treating this with iodoform packing however I really have  no expectation of healing and to be truthful the amount of drainage that is coming out of this knee I don't think closure of this site is necessarily considered a positive outcome. Her left knee remains warm but not painful there is an effusion she has no systemic symptoms no fever no chills or and she feels well 12/31/16. Still is same amount of depth today using a skinny at 4.6 cm. There is less drainage. We have been using iodoform packing and I really have no expectation of healing and to be truthful closing this sinus probably is really not the best thing to consider. 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 She is not felt to be a candidate for a fourth replacement. In the meantime she does not complain of pain except when the dressing is being changed episodically. She has not been systemically unwell no fever or chills or other issues. 01/14/17; after the patient left on 3/7 her daughter tells me the next day the wound had closed over and therefore this is not intact since. Over the last 3 days the patient has noted significant pain in the popliteal fossa of the involved left knee. 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 MSSA was DEANIE, JUPITER. ([id number]) the cause of the original identified joint infection treated at [facility]. 01/21/17; I saw this patient last week at which time there was intense erythema in the popliteal fossa, tenderness and a ballotable fluid collection. I did an IandD on this. Surprisingly the culture of this was negative although as a result of the IandD her tenderness and erythema in the area is resolved. We continue with the iodoform packing READMISSION 04/07/17; this is a patient I know from 2 prior admissions to this clinic starting in January of this year. Her history is essentially as noted above. 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 She has a draining sinus into the popliteal fossa of the left knee. This remains open and drains. She has been using iodoform packing to this area with gauze over the top. To be truthful I haven't really been certain whether having this area close would be helpful or harmful to this patient. Previously when it's closed she develops a lot more pain in the back of the popliteal fossa and more pain in the knee. She is here today because the packing of the area has become increasingly painful in fact the patient describes this as a "torture chamber" experience. They are packing this wound twice a day. The patient follows with orthopedic surgery and infectious disease at [facility] although she hasn't seen them since she was last here in March. She denies any systemic symptoms including fever chills etc. She states except for the changing of the dressing her pain when she is mobilizing on the knee is 2/10. She also has a area on  the left anterior patella in the incision that is excoriated open. The patient states this is itchy and she does a lot of scratching. They state this was here last time she was here but I have no recollection or documentation of this 04/21/17; patient was readmitted to clinic 2 weeks ago. She has a draining sinus in the left popliteal fossa and a chronically infected left total knee replacement. She also had an excoriation on the left anterior knee. She arrives in clinic today with all of this looking considerably better. The divot in the popliteal fossa is actually closed. READMISSION 07/15/17; the patient arrived today with a real opening of the draining sinus in her left popliteal fossa. This opened about a week ago after developing a painful "knot". Is been draining clear fluid. The patient's pain is a lot better. She is not been systemically unwell. She also is been dealing with an area on the left anterior leg just below the  tibial plateau. She also had this last time. Her daughter states that she scratches this area continuously. I have reviewed the last infectious disease note from August. 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 Much to my chagrin they seem reluctant to tap the joint. A culture of purulent material that came out of the popliteal fossa earlier this year cultured coag-negative staph aureus [not an uncommon cause of prosthetic joint infection]. However they really ignored this and they state that the source was questionable. If they believe this I am wondering why they're so reluctant to tap the fluid. She has not seen orthopedics 07/22/17; patient's sinus tract in the popliteal fossa about 2.5 cm. The area on the anterior part of her knee looks a lot better. We have been using iodoform packing to the popliteal fossa, collagen to the anterior part of her knee. She is not experiencing undue pain or systemic symptoms 08/05/17; patient has a sinus tract in the popliteal fossa on the left. 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 She does not have any options for further replacements she is only been offered an above-knee amputation. 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 She has not been systemically unwell no fever. There is some pain and swelling in the knee but all in all this is not unbearable. They've been using iodoform packing 09/02/17; continued sinus tract on the popliteal fossa on the left as part of a chronic infection on a left total knee replacement. She follows with infectious disease at [facility] although the patient and her daughter are not exactly sure when her next appointment is. She has not been systemically unwell specifically denies fever chills. She has some pain in the left knee however she is reluctant to take even simple analgesics because of  the risk of" addiction" 09/30/17; continued sinus tract in the popliteal fossa on the left as part of a chronic infection on the left total knee replacement. She has not been following recently with infectious disease at [facility]. She has some pain in the left knee although this is not major and not changed over the last recent months. Depth of the probing area is 1.9 cm READMISSION 5/50/19; this is a patient we have seen in this clinic several times before predominantly related to a draining sinus tract in the left popliteal fossa as part of a chronic infection in the left total knee replacement.  She sought infectious disease in February. A discontinue doxycycline she was on at the time and continued her on long-standing prophylactic cephalexin which she is still on. 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 I see she was seen by pulmonary in April with chronic respiratory failure with hypoxia and cor pulmonale and pulmonary hypertension. ROSA, WYLY. ([id number]) She comes clinic this time with the sudden onset of 2 small wounds on the right anterior patella and the right anterior lower leg just below the tibial tuberosity. It is not really clear how this happened although presumably minor trauma that the patient didn't recognize. Her daughter states that she simply noticed blood on the sheets. She may have picked at her skin as well. I believe we've had superficial wounds on the patella previously in this clinic didn't seem to heal over fairly easily. The patient has not been systemically unwell no fever no chills she does not have a lot of pain in the knee just occasionally when she is up on it Wound History Patient presents with 3 open wounds that have been present for approximately 1 a few months. Patient has been treating wounds in the following manner: triple antibiotic. Laboratory tests have not been performed in the last month. Patient reportedly has not  tested positive for an antibiotic resistant organism. Patient reportedly has not tested positive for osteomyelitis. Patient reportedly has not had testing performed to evaluate circulation in the legs. Patient History Information obtained from Patient. Allergies Zithromax, Sulfa (Sulfonamide Antibiotics), prednisone, adhesive tape Family History Cancer - Mother, Diabetes - Siblings,Father, Heart Disease - Father, Hypertension - Father, Seizures - Child, No family history of Kidney Disease, Lung Disease, Stroke, Thyroid Problems, Tuberculosis. Social History Never smoker, Marital Status - Widowed, Alcohol Use - Rarely, Drug Use - No History, Caffeine Use - Never. Medical History Eyes Patient has history of Cataracts - Removed Denies history of Glaucoma, Optic Neuritis Ear/Nose/Mouth/Throat Denies history of Chronic sinus problems/congestion, Middle ear problems Hematologic/Lymphatic Denies history of Anemia, Hemophilia, Human Immunodeficiency Virus, Lymphedema Respiratory Patient has history of Sleep Apnea - 2L O2 at night Denies history of Aspiration, Asthma, Chronic Obstructive Pulmonary Disease (COPD), Pneumothorax, Tuberculosis Cardiovascular Patient has history of Arrhythmia, Hypertension Denies history of Angina, Congestive Heart Failure, Coronary Artery Disease, Deep Vein Thrombosis, Hypotension, Myocardial Infarction, Peripheral Arterial Disease, Peripheral Venous Disease, Phlebitis, Vasculitis Gastrointestinal Denies history of Cirrhosis , Colitis, Crohn s, Hepatitis A, Hepatitis B, Hepatitis C Endocrine Denies history of Type I Diabetes, Type II Diabetes Genitourinary Denies history of End Stage Renal Disease Immunological Patient has history of Scleroderma Denies history of Lupus Erythematosus, Raynaud s Integumentary (Skin) Denies history of History of Burn, History of pressure wounds Musculoskeletal Denies history of Gout, Rheumatoid Arthritis, Osteoarthritis,  Osteomyelitis Neurologic Denies history of Dementia, Neuropathy, Quadriplegia, Paraplegia, Seizure Disorder Oncologic ZINEB, GLADE. ([id number]) Denies history of Received Chemotherapy, Received Radiation Psychiatric Denies history of Anorexia/bulimia, Confinement Anxiety Review of Systems (ROS) Eyes Complains or has symptoms of Glasses / Contacts. Gastrointestinal Denies complaints or symptoms of Frequent diarrhea, Nausea, Vomiting. Endocrine Denies complaints or symptoms of Hepatitis, Thyroid disease, Polydypsia (Excessive Thirst). Genitourinary Denies complaints or symptoms of Kidney failure/ Dialysis, Incontinence/dribbling. Immunological Denies complaints or symptoms of Hives, Itching. Integumentary (Skin) Complains or has symptoms of Wounds, Bleeding or bruising tendency, Swelling. Denies complaints or symptoms of Breakdown. Musculoskeletal Complains or has symptoms of Muscle Weakness. Denies complaints or symptoms of Muscle Pain. Objective Constitutional Sitting or standing Blood Pressure  is within target range for patient.. Pulse regular and within target range for patient.Marland Kitchen Respirations regular, non-labored and within target range.. Temperature is normal and within the target range for the patient.Marland Kitchen appears in no distress. Vitals Time Taken: 10:26 AM, Height: 66 in, Source: Stated, Temperature: 97.7 F, Pulse: 76 bpm, Respiratory Rate: 18 breaths/min, Blood Pressure: 133/59 mmHg. Eyes Conjunctivae clear. No discharge. Respiratory Respiratory effort is easy and symmetric bilaterally. Rate is normal at rest and on room air.. Cardiovascular Pedal pulses palpable and strong bilaterally.. Lymphatic none palpable in the left popliteal or inguinal area. Musculoskeletal marked swelling of the left knee perhaps a small effusion. Tender along the joint lines. Psychiatric No evidence of depression, anxiety, or agitation. Calm, cooperative, and communicative. Appropriate  interactions and affect.Rogers Blocker, Blima Singer ([id number]) General Notes: wound exam; #1 the original draining sinus tract in the popliteal fossa is still open and probably should remain so. It drains yellowish-looking synovial fluid #2 small superficial areas over the left patella and left anterior tibia just below the tibial tuberosity. Significant surface eschar removed from these with a #3 curet. These cleanup quite neat nicely hemostasis with direct pressure Integumentary (Hair, Skin) Wound #6 status is Open. Original cause of wound was Gradually Appeared. The wound is located on the Left Knee. The wound measures 0.8cm length x 1.2cm width x 0.1cm depth; 0.754cm^2 area and 0.075cm^3 volume. There is no tunneling or undermining noted. There is a medium amount of serosanguineous drainage noted. The wound margin is distinct with the outline attached to the wound base. There is medium (34-66%) red granulation within the wound bed. There is a medium (34- 66%) amount of necrotic tissue within the wound bed including Eschar and Adherent Slough. The periwound skin appearance exhibited: Excoriation. The periwound skin appearance did not exhibit: Callus, Crepitus, Induration, Rash, Scarring, Dry/Scaly, Maceration, Atrophie Blanche, Cyanosis, Ecchymosis, Hemosiderin Staining, Mottled, Pallor, Rubor, Erythema. Periwound temperature was noted as No Abnormality. The periwound has tenderness on palpation. Wound #7 status is Open. Original cause of wound was Gradually Appeared. The wound is located on the Left,Medial Lower Leg. The wound measures 1.4cm length x 1.1cm width x 0.1cm depth; 1.21cm^2 area and 0.121cm^3 volume. There is Fat Layer (Subcutaneous Tissue) Exposed exposed. There is no tunneling or undermining noted. There is a medium amount of serous drainage noted. The wound margin is distinct with the outline attached to the wound base. There is no granulation within the wound bed. There is a large  (67-100%) amount of necrotic tissue within the wound bed including Adherent Slough. The periwound skin appearance did not exhibit: Callus, Crepitus, Excoriation, Induration, Rash, Scarring, Dry/Scaly, Maceration, Atrophie Blanche, Cyanosis, Ecchymosis, Hemosiderin Staining, Mottled, Pallor, Rubor, Erythema. Periwound temperature was noted as No Abnormality. The periwound has tenderness on palpation. Wound #8 status is Open. Original cause of wound was Surgical Injury. The wound is located on the Left,Posterior Knee. The wound measures 0.3cm length x 0.3cm width x 0.2cm depth; 0.071cm^2 area and 0.014cm^3 volume. There is no tunneling or undermining noted. There is a large amount of serous drainage noted. The wound margin is distinct with the outline attached to the wound base. There is no granulation within the wound bed. There is no necrotic tissue within the wound bed. The periwound skin appearance did not exhibit: Callus, Crepitus, Excoriation, Induration, Rash, Scarring, Dry/Scaly, Maceration, Atrophie Blanche, Cyanosis, Ecchymosis, Hemosiderin Staining, Mottled, Pallor, Rubor, Erythema. Periwound temperature was noted as No Abnormality. The periwound has tenderness on palpation. 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 There was a Excisional Skin/Subcutaneous Tissue Debridement with a total area of 1.54 sq cm performed by Ricard Dillon, MD. With the following instrument(s): Curette to remove Viable and Non-Viable tissue/material. Material  removed includes Subcutaneous Tissue and Skin: Dermis and after achieving pain control using Other (lidocaine 4%). No specimens were taken. A time out JENE, HUQ. ([id number]) was conducted at 11:16, prior to the start of the procedure. A Minimum amount of bleeding was controlled with Pressure. The procedure was tolerated well with a pain level of 0 throughout and a pain level of 0 following the procedure. Patient s Level of Consciousness post procedure was recorded as Awake and Alert and post-procedure vitals were taken including Temperature: 97.7 F, Pulse: 76 bpm, Respiratory Rate: 16 breaths/min, Blood Pressure: (133)/(59) mmHg. Post Debridement Measurements: 1.4cm length x 1.1cm width x 0.1cm depth; 0.121cm^3 volume. Character of Wound/Ulcer Post Debridement requires further debridement. Post procedure Diagnosis Wound #7: Same as Pre-Procedure Plan Wound Cleansing: Wound #6 Left Knee: Cleanse wound with mild soap and water Wound #7 Left,Medial Lower Leg: Cleanse wound with mild soap and water Wound #8 Left,Posterior Knee: Cleanse wound with mild soap and water Anesthetic (add to Medication List): Wound #6 Left Knee: Topical Lidocaine 4% cream applied to wound bed prior to debridement (In Clinic Only). Wound #7 Left,Medial Lower Leg: Topical Lidocaine 4% cream applied to wound bed prior to debridement (In Clinic Only). Wound #8 Left,Posterior Knee: Topical Lidocaine 4% cream applied to wound bed prior to debridement (In Clinic Only). Primary Wound Dressing: Wound #6 Left Knee: Silver Collagen Wound #7 Left,Medial Lower Leg: Silver Collagen Secondary Dressing: Wound #6 Left Knee: [place] Wound #7 Left,Medial Lower Leg: [place] Wound #8 Left,Posterior Knee: [place] Dressing Change Frequency: Wound #6 Left Knee: Change dressing every other day. Wound #7 Left,Medial Lower Leg: Change dressing every other day. Wound #8 Left,Posterior Knee: Change dressing  every other day. Follow-up Appointments: Wound #6 Left Knee: Return Appointment in 1 week. Wound #7 Left,Medial Lower Leg: Return Appointment in 1 week. Wound #8 Left,Posterior Knee: Return Appointment in 1 week. TOMASINA, KEASLING. ([id number]) #1 continue to put gauze over the area in the popliteal fossa. This should not be encouraged to close. She develops a very painful almost abscess-like condition that setting. #2 to the superficial wounds on the left patella left anterior tibia. Silver collagen covered by [place]. I think these are not very worrisome. Probably incidental trauma Electronic Signature(s) Signed: 03/10/2018 4:50:03 PM By: Linton Ham MD Entered By: Linton Ham on 03/10/2018 16:07:00 [name] ([id number]) -------------------------------------------------------------------------------- ROS/PFSH Details Patient Name: [name]. Date of Service: 03/10/2018 9:45 AM Medical Record Number: [id number] Patient Account Number: [id number] Date of Birth/Sex: [birth year] (84 y.o. F) Treating RN: Roger Shelter Primary Care Provider: Loura Pardon Other Clinician: Referring Provider: Referral, Self Treating Provider/Extender: Tito Dine in Treatment: 0 Label Progress Note Print Version as History and Physical for this encounter Information Obtained From Patient Wound History Do you currently have one or more open woundso  Yes How many open wounds do you currently haveo 3 Approximately how long have you had your woundso 1 a few months How have you been treating your wound(s) until nowo triple antibiotic Has your wound(s) ever healed and then re-openedo No Have you had any lab work done in the past montho No Have you tested positive for an antibiotic resistant organism (MRSA, VRE)o No Have you tested positive for osteomyelitis (bone infection)o No Have you had any tests for circulation on your legso No Eyes Complaints and Symptoms: Positive for:  Glasses / Contacts Medical History: Positive for: Cataracts - Removed Negative for: Glaucoma; Optic Neuritis Gastrointestinal Complaints and Symptoms: Negative for: Frequent diarrhea; Nausea; Vomiting Medical History: Negative for: Cirrhosis ; Colitis; Crohnos; Hepatitis A; Hepatitis B; Hepatitis C Endocrine Complaints and Symptoms: Negative for: Hepatitis; Thyroid disease; Polydypsia (Excessive Thirst) Medical History: Negative for: Type I Diabetes; Type II Diabetes Genitourinary Complaints and Symptoms: Negative for: Kidney failure/ Dialysis; Incontinence/dribbling Medical History: Negative for: End Stage Renal Disease Immunological HOLLYE, PRITT. ([id number]) Complaints and Symptoms: Negative for: Hives; Itching Medical History: Positive for: Scleroderma Negative for: Lupus Erythematosus; Raynaudos Integumentary (Skin) Complaints and Symptoms: Positive for: Wounds; Bleeding or bruising tendency; Swelling Negative for: Breakdown Medical History: Negative for: History of Burn; History of pressure wounds Musculoskeletal Complaints and Symptoms: Positive for: Muscle Weakness Negative for: Muscle Pain Medical History: Negative for: Gout; Rheumatoid Arthritis; Osteoarthritis; Osteomyelitis Ear/Nose/Mouth/Throat Medical History: Negative for: Chronic sinus problems/congestion; Middle ear problems Hematologic/Lymphatic Medical History: Negative for: Anemia; Hemophilia; Human Immunodeficiency Virus; Lymphedema Respiratory Medical History: Positive for: Sleep Apnea - 2L O2 at night Negative for: Aspiration; Asthma; Chronic Obstructive Pulmonary Disease (COPD); Pneumothorax; Tuberculosis Cardiovascular Medical History: Positive for: Arrhythmia; Hypertension Negative for: Angina; Congestive Heart Failure; Coronary Artery Disease; Deep Vein Thrombosis; Hypotension; Myocardial Infarction; Peripheral Arterial Disease; Peripheral Venous Disease; Phlebitis;  Vasculitis Neurologic Medical History: Negative for: Dementia; Neuropathy; Quadriplegia; Paraplegia; Seizure Disorder Oncologic Medical History: Negative for: Received Chemotherapy; Received Radiation Psychiatric GUDRUN, AXE ([id number]) Medical History: Negative for: Anorexia/bulimia; Confinement Anxiety HBO Extended History Items Eyes: Cataracts Immunizations Pneumococcal Vaccine: Received Pneumococcal Vaccination: Yes Immunization Notes: up to date also flu shot up to date Implantable Devices Family and Social History Cancer: Yes - Mother; Diabetes: Yes - Siblings,Father; Heart Disease: Yes - Father; Hypertension: Yes - Father; Kidney Disease: No; Lung Disease: No; Seizures: Yes - Child; Stroke: No; Thyroid Problems: No; Tuberculosis: No; Never smoker; Marital Status - Widowed; Alcohol Use: Rarely; Drug Use: No History; Caffeine Use: Never; Financial Concerns: No; Food, Clothing or Shelter Needs: No; Support System Lacking: No; Transportation Concerns: No; Advanced Directives: Yes (Not Provided); Patient does not want information on Advanced Directives; Do not resuscitate: No; Living Will: Yes (Not Provided); Medical Power of Attorney: Yes Santiago Glad (Not Provided) Electronic Signature(s) Signed: 03/10/2018 3:23:03 PM By: Roger Shelter Signed: 03/10/2018 4:50:03 PM By: Linton Ham MD Entered By: Roger Shelter on 03/10/2018 10:34:08 [name] ([id number]) -------------------------------------------------------------------------------- SuperBill Details Patient Name: [name]. Date of Service: 03/10/2018 Medical Record Number: [id number] Patient Account Number: [id number] Date of Birth/Sex: 08-21-1934 (84 y.o. 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

## 2018-03-17 ENCOUNTER — Encounter: Payer: Medicare Other | Admitting: Internal Medicine

## 2018-03-17 DIAGNOSIS — L97829 Non-pressure chronic ulcer of other part of left lower leg with unspecified severity: Secondary | ICD-10-CM | POA: Diagnosis not present

## 2018-03-19 NOTE — Progress Notes (Signed)
SHERHONDA, GASPAR ([id number]) Visit Report for 03/17/2018 HPI Details [name], [name]. Date of Service: 03/17/2018 10:15 AM Medical Record Number: [id number] Patient Account Number: [id number] Date of Birth/Sex: [birth year] (84 y.o. F) Treating RN: Cornell Barman Primary Care Provider: Loura Pardon Other Clinician: Referring Provider: Loura Pardon Treating Provider/Extender: Tito Dine in Treatment: 1 History of Present Illness HPI Description: 10/28/16; this is an 84 year old woman who arrives with a complicated medical issue. 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 Initially in 2000 and apparently most recently in 2014. 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 Indeed she was hospitalized on 02/13/16 and discharged on 02/19/16. She was taken to the OR and it was found that the popliteal abscess was communicating with her joint space. Signed to feel fluid grew MSSA. Patient was treated with IandD of the abscess. She completed IV cefazolin on 03/28/16 and since then has been on Keflex and rifampin for chronic suppression. Her infectious disease doctor is Dr.Shrestha at [facility]. The patient was seen in our [place] sister clinic through much of October and November 2017. At that point she had a draining sinus that eventually closed over. She noted some pain behind her knee surrounding Christmas day and was seen in our emergency room on 10/23/16. During this ER visit she underwent tendon incision and drainage of a left Baker's cyst and clear colored synovial fluid was obtained. Doxycycline was added for a week to her Keflex and rifampin. A CT scan was ordered but I don't think was ever done due to artifacts from hardware. Her wound culture was negative. She has been using iodoform packing. She states her knee is painful when she stands on it for a period of time. She is not systemically unwell. She has a history of  scleroderma or without is been under control for some period of time. 11/04/16 the culture that I did of this area last week showed coag-negative staph and a few enterococcus faecium. Enterococcus is ampicillin resistant. The coag negative staph is only sensitive to vancomycin and tetracycline. Previous joint infection was methicillin sensitive staph aureus. It seems I misunderstood what she said and that her infectious disease clinic appointment is this Friday and the orthopedic appointment was last Friday. I faxed the culture results to the infectious disease clinic but I've given him a copy of it today. Per the patient and her family they orthopedic surgeon did not want to do anything further to this joint. The patient is not systemically unwell in particular no fever or chills and the pain she has is really minimal 11/18/16; this is a patient who has an infected left knee prosthetic joint. She went to see infectious disease at [facility] who apparently did did not recommend any further antibiotic therapy. She continues to feel the same episodic pain but no systemic symptoms of particular no fever. The depth of the wound in the popliteal fossa of her left knee is 3.5 versus 5 cm last week 11/25/16 4.5 cm of depth today. Draining exit site. We have treating this with iodoform packing however I really have no expectation of healing and to be truthful the amount of drainage that is coming out of this knee I don't think closure of this site is necessarily considered a positive outcome. Her left knee remains warm but not painful there is an effusion she has no systemic symptoms no fever no chills or and she feels well 12/31/16. Still is  same amount of depth today using a skinny at 4.6 cm. There is less drainage. We have been using iodoform packing and I really have no expectation of healing and to be truthful closing this sinus probably is really not the best thing to consider. The patient has an infected  artificial knee joint on the left. She is not felt to be a candidate for a fourth replacement. In the meantime she does not complain of pain except when the dressing is being changed episodically. She has not been systemically unwell no fever or chills or other issues. 01/14/17; after the patient left on 3/7 her daughter tells me the next day the wound had closed over and therefore this is not intact since. Over the last 3 days the patient has noted significant pain in the popliteal fossa of the involved left knee. 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 MSSA was the cause of the original identified joint infection treated at [facility]. 01/21/17; I saw this patient last week at which time there was intense erythema in the popliteal fossa, tenderness and a ballotable fluid collection. I did an IandD on this. Surprisingly the culture of this was negative although as a result of the IandD her tenderness and erythema in the area is resolved. We continue with the iodoform packing READMISSION 04/07/17; this is a patient I know from 2 prior admissions to this clinic starting in January of this year. Her history is essentially as noted above. 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 ([id number]) suppressive antibiotics. She has a draining sinus into the popliteal fossa of the left knee. This remains open and drains. She has been using iodoform packing to this area with gauze over the top. To be truthful I haven't really been certain whether having this area close would be helpful or harmful to this patient. Previously when it's closed she develops a lot more pain in the back of the popliteal fossa and more pain in the knee. She is here today because the packing of the area has become increasingly painful in fact the patient describes this as a "torture chamber"  experience. They are packing this wound twice a day. The patient follows with orthopedic surgery and infectious disease at [facility] although she hasn't seen them since she was last here in March. She denies any systemic symptoms including fever chills etc. She states except for the changing of the dressing her pain when she is mobilizing on the knee is 2/10. She also has a area on the left anterior patella in the incision that is excoriated open. The patient states this is itchy and she does a lot of scratching. They state this was here last time she was here but I have no recollection or documentation of this 04/21/17; patient was readmitted to clinic 2 weeks ago. She has a draining sinus in the left popliteal fossa and a chronically infected left total knee replacement. She also had an excoriation on the left anterior knee. She arrives in clinic today with all of this looking considerably better. The divot in the popliteal fossa is actually closed. READMISSION 07/15/17; the patient arrived today with a real opening of the draining sinus in her left popliteal fossa. This opened about a week ago after developing a painful "knot". Is been draining clear fluid. The patient's pain is a lot better. She is not  been systemically unwell. oShe also is been dealing with an area on the left anterior leg just below the tibial plateau. She also had this last time. Her daughter states that she scratches this area continuously. I have reviewed the last infectious disease note from August. 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 Much to my chagrin they seem reluctant to tap the joint. A culture of purulent material that came out of the popliteal fossa earlier this year cultured coag-negative staph aureus [not an uncommon cause of prosthetic joint infection]. However they really ignored this and they state that the source  was questionable. If they believe this I am wondering why they're so reluctant to tap the fluid. She has not seen orthopedics 07/22/17; patient's sinus tract in the popliteal fossa about 2.5 cm. The area on the anterior part of her knee looks a lot better. We have been using iodoform packing to the popliteal fossa, collagen to the anterior part of her knee. She is not experiencing undue pain or systemic symptoms 08/05/17; patient has a sinus tract in the popliteal fossa on the left. 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 She does not have any options for further replacements she is only been offered an above-knee amputation. 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 She has not been systemically unwell no fever. There is some pain and swelling in the knee but all in all this is not unbearable. They've been using iodoform packing 09/02/17; continued sinus tract on the popliteal fossa on the left as part of a chronic infection on a left total knee replacement. She follows with infectious disease at [facility] although the patient and her daughter are not exactly sure when her next appointment is. She has not been systemically unwell specifically denies fever chills. She has some pain in the left knee however she is reluctant to take even simple analgesics because of the risk of" addiction" 09/30/17; continued sinus tract in the popliteal fossa on the left as part of a chronic infection on the left total knee replacement. She has not been following recently with infectious disease at [facility]. She has some pain in the left knee although this is not major and not changed over the last recent months. Depth of the probing area is 1.9 cm READMISSION 03/10/18; this is a patient we have seen in this clinic several times before predominantly related to a draining sinus tract in the left popliteal fossa as part of a chronic infection in the left total knee  replacement. She sought infectious disease in February. A discontinue doxycycline she was on at the time and continued her on long-standing prophylactic cephalexin which she is still on. 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 I see she was seen by pulmonary in April with chronic respiratory failure with hypoxia and cor pulmonale and pulmonary hypertension. She comes clinic this time with the sudden onset of 2 small wounds on the right anterior patella and the right anterior lower leg just below the tibial tuberosity. It is not really clear how this happened although presumably minor trauma that the patient didn't recognize. Her daughter states that she simply noticed blood on the sheets. She may have picked at her skin as well. I believe we've had superficial wounds on the patella previously in this clinic didn't seem to heal over fairly  easily. The patient has not been systemically unwell no fever no chills she does not have a lot of pain in the knee just occasionally when she is up on it 03/17/18; this is a patient we have seen in this clinic previously. She has a chronically infected left total knee replacement that TIRZAH, FROSS. ([id number]) is not felt by [facility] to be a candidate for joint replacement. Previous organism has been MSSA. She has a draining sinus in the left popliteal fossa. I think the area here is been stable. She has superficial areas over the left patella and just below the tibial tuberosity. We've using silver collagen. The area over the patellas just about closed and these area under the tibial tuberosity is smaller this week. Electronic Signature(s) Signed: 03/17/2018 5:10:30 PM By: Linton Ham MD Entered By: Linton Ham on 03/17/2018 12:35:48 [name] ([id number]) -------------------------------------------------------------------------------- Physical Exam Details [name], [name]. Date of Service: 03/17/2018  10:15 AM Medical Record Number: [id number] Patient Account Number: [id number] Date of Birth/Sex: [birth year] (84 y.o. F) Treating RN: Cornell Barman Primary Care Provider: Loura Pardon Other Clinician: Referring Provider: Loura Pardon Treating Provider/Extender: Tito Dine in Treatment: 1 Constitutional Sitting or standing Blood Pressure is within target range for patient.. Pulse regular and within target range for patient.Marland Kitchen Respirations regular, non-labored and within target range.. Temperature is normal and within the target range for the patient.Marland Kitchen appears in no distress. Eyes Conjunctivae clear. No discharge. Respiratory Respiratory effort is easy and symmetric bilaterally. Rate is normal at rest and on room air.. Cardiovascular pulses are palpable. Lymphatic none palpable in the popliteal area. Integumentary (Hair, Skin) no primary skin issue. Psychiatric No evidence of depression, anxiety, or agitation. Calm, cooperative, and communicative. Appropriate interactions and affect.. Notes wound exam; the original draining sinus tract remains intact. This probably should not close as it results in an almost abscess- like collection of fluid that I have had to IandD at some point in the past. oThe superficial area over the left patella is almost fully epithelialized oThe area below the tibial tuberosity is a lot smaller looks healthy. No debridement is required Electronic Signature(s) Signed: 03/17/2018 5:10:30 PM By: Linton Ham MD Entered By: Linton Ham on 03/17/2018 12:37:52 [name] ([id number]) -------------------------------------------------------------------------------- Physician Orders Details Patient Name: [name]. Date of Service: 03/17/2018 10:15 AM Medical Record Number: [id number] Patient Account Number: [id number] Date of Birth/Sex: Jul 25, 1934 (84 y.o. F) Treating RN: Cornell Barman Primary Care Provider: Loura Pardon Other  Clinician: Referring Provider: Loura Pardon Treating Provider/Extender: Tito Dine in Treatment: 1 Verbal / Phone Orders: No Diagnosis Coding Wound Cleansing Wound #6 Left Knee o Cleanse wound with mild soap and water Wound #7 Left,Medial Lower Leg o Cleanse wound with mild soap and water Wound #8 Left,Posterior Knee o Cleanse wound with mild soap and water Anesthetic (add to Medication List) Wound #6 Left Knee o Topical Lidocaine 4% cream applied to wound bed prior to debridement (In Clinic Only). Wound #7 Left,Medial Lower Leg o Topical Lidocaine 4% cream applied to wound bed prior to debridement (In Clinic Only). Wound #8 Left,Posterior Knee o Topical Lidocaine 4% cream applied to wound bed prior to debridement (In Clinic Only). Primary Wound Dressing Wound #6 Left Knee o Silver Collagen Wound #7 Left,Medial Lower Leg o Silver Collagen Secondary Dressing Wound #6 Left Knee o [place] Wound #7 Left,Medial Lower Leg o [place] #8 [place] Dressing Change Frequency Wound #6  Left Knee o Change dressing every other day. Wound #7 Left,Medial Lower Leg o Change dressing every other day. TAMMRA, PRESSMAN. ([id number]) Wound #8 Left,Posterior Knee o Change dressing every other day. Follow-up Appointments Wound #6 Left Knee o Return Appointment in 1 week. Wound #7 Left,Medial Lower Leg o Return Appointment in 1 week. Wound #8 Left,Posterior Knee o Return Appointment in 1 week. Electronic Signature(s) Signed: 03/17/2018 5:10:30 PM By: Linton Ham MD Signed: 03/17/2018 5:19:09 PM By: Gretta Cool, BSN, RN, CWS, Kim RN, BSN Entered By: Gretta Cool, BSN, RN, CWS, Kim on 03/17/2018 11:37:09 [name], [name] ([id number]) -------------------------------------------------------------------------------- Problem List Details [name], [name]. Date of Service: 03/17/2018 10:15 AM Medical Record  Number: [id number] Patient Account Number: [id number] Date of Birth/Sex: September 21, 1934 (84 y.o. 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 Date of Service: 03/17/2018 10:15 AM Medical Record Number: [id number] Patient Account Number: [id number] Date of Birth/Sex: 10-27-1934 (84 y.o. F) Treating RN: Cornell Barman Primary Care Provider: Loura Pardon Other Clinician: Referring Provider: Loura Pardon Treating Provider/Extender: Tito Dine in Treatment: 1 Subjective History of Present Illness (HPI) 10/28/16; this is an 84 year old woman who arrives with a complicated medical issue. 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 Initially in 2000 and apparently most recently in 2014. 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 Indeed she was hospitalized on 02/13/16 and discharged on 02/19/16. She was taken to the OR and it was found that the popliteal abscess was communicating with her joint space. Signed to feel fluid grew MSSA. Patient was treated with IandD of the abscess. She completed IV cefazolin on 03/28/16 and since then has been on Keflex and rifampin for chronic suppression. Her infectious disease doctor is Dr.Shrestha  at [facility]. The patient was seen in our [place] sister clinic through much of October and November 2017. At that point she had a draining sinus that eventually closed over. She noted some pain behind her knee surrounding Christmas day and was seen in our emergency room on 10/23/16. During this ER visit she underwent tendon incision and drainage of a left Baker's cyst and clear colored synovial fluid was obtained. Doxycycline was added for a week to her Keflex and rifampin. A CT scan was ordered but I don't think was ever done due to artifacts from hardware. Her wound culture was negative. She has been using iodoform packing. She states her knee is painful when she stands on it for a period of time. She is not systemically unwell. She has a history of scleroderma or without is been under control for some period of time. 11/04/16 the culture that I did of this area last week showed coag-negative staph and a few enterococcus faecium. Enterococcus is ampicillin resistant. The coag negative staph is only sensitive to vancomycin and tetracycline. Previous joint infection was methicillin sensitive staph aureus. It seems I misunderstood what she said and that her infectious disease clinic appointment is this Friday  and the orthopedic appointment was last Friday. I faxed the culture results to the infectious disease clinic but I've given him a copy of it today. Per the patient and her family they orthopedic surgeon did not want to do anything further to this joint. The patient is not systemically unwell in particular no fever or chills and the pain she has is really minimal 11/18/16; this is a patient who has an infected left knee prosthetic joint. She went to see infectious disease at [facility] who apparently did did not recommend any further antibiotic therapy. She continues to feel the same episodic pain but no systemic symptoms of particular no fever. The depth of the wound in the popliteal fossa of her  left knee is 3.5 versus 5 cm last week 11/25/16 4.5 cm of depth today. Draining exit site. We have treating this with iodoform packing however I really have no expectation of healing and to be truthful the amount of drainage that is coming out of this knee I don't think closure of this site is necessarily considered a positive outcome. Her left knee remains warm but not painful there is an effusion she has no systemic symptoms no fever no chills or and she feels well 12/31/16. Still is same amount of depth today using a skinny at 4.6 cm. There is less drainage. We have been using iodoform packing and I really have no expectation of healing and to be truthful closing this sinus probably is really not the best thing to consider. 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 She is not felt to be a candidate for a fourth replacement. In the meantime she does not complain of pain except when the dressing is being changed episodically. She has not been systemically unwell no fever or chills or other issues. 01/14/17; after the patient left on 3/7 her daughter tells me the next day the wound had closed over and therefore this is not intact since. Over the last 3 days the patient has noted significant pain in the popliteal fossa of the involved left knee. 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 MSSA was the cause of the original identified joint infection treated at [facility]. 01/21/17; I saw this patient last week at which time there was intense erythema in the popliteal fossa, tenderness and a ballotable fluid collection. I did an IandD on this. Surprisingly the culture of this was negative although as a result of the IandD her tenderness and erythema in the area is resolved. We continue with the iodoform packing READMISSION 04/07/17; this is a patient I know from 2 prior admissions to this clinic starting in  January of this year. Her history is essentially as noted above. 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 She has a draining sinus into the popliteal fossa of the left knee. This remains open and drains. She has been using iodoform packing to this area with gauze over the top. To be truthful I haven't really been certain whether [name], [name]. ([id number]) having this area close would be helpful or harmful to this patient. Previously when it's closed she develops a lot more pain in the back of the popliteal fossa and more pain in the knee. She is here today because the packing of the area has become increasingly painful in fact the patient describes this as a "torture chamber"  experience. They are packing this wound twice a day. The patient follows with orthopedic surgery and infectious disease at [facility] although she hasn't seen them since she was last here in March. She denies any systemic symptoms including fever chills etc. She states except for the changing of the dressing her pain when she is mobilizing on the knee is 2/10. She also has a area on the left anterior patella in the incision that is excoriated open. The patient states this is itchy and she does a lot of scratching. They state this was here last time she was here but I have no recollection or documentation of this 04/21/17; patient was readmitted to clinic 2 weeks ago. She has a draining sinus in the left popliteal fossa and a chronically infected left total knee replacement. She also had an excoriation on the left anterior knee. She arrives in clinic today with all of this looking considerably better. The divot in the popliteal fossa is actually closed. READMISSION 07/15/17; the patient arrived today with a real opening of the draining sinus in her left popliteal fossa. This opened about a week ago after developing a painful "knot". Is been draining  clear fluid. The patient's pain is a lot better. She is not been systemically unwell. She also is been dealing with an area on the left anterior leg just below the tibial plateau. She also had this last time. Her daughter states that she scratches this area continuously. I have reviewed the last infectious disease note from August. 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 Much to my chagrin they seem reluctant to tap the joint. A culture of purulent material that came out of the popliteal fossa earlier this year cultured coag-negative staph aureus [not an uncommon cause of prosthetic joint infection]. However they really ignored this and they state that the source was questionable. If they believe this I am wondering why they're so reluctant to tap the fluid. She has not seen orthopedics 07/22/17; patient's sinus tract in the popliteal fossa about 2.5 cm. The area on the anterior part of her knee looks a lot better. We have been using iodoform packing to the popliteal fossa, collagen to the anterior part of her knee. She is not experiencing undue pain or systemic symptoms 08/05/17; patient has a sinus tract in the popliteal fossa on the left. 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 She does not have any options for further replacements she is only been offered an above-knee amputation. 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 She has not been systemically unwell no fever. There is some pain and swelling in the knee but all in all this is not unbearable. They've been using iodoform packing 09/02/17; continued sinus tract on the popliteal fossa on the left as part of a chronic infection on a left total knee replacement. She follows with infectious disease at [facility] although the patient and her daughter are not exactly sure when her next appointment is. She has  not been systemically unwell specifically denies fever chills. She has some pain in the left knee however she is reluctant to take even simple analgesics because of the risk of" addiction" 09/30/17; continued sinus tract in the popliteal fossa on the left as part of a chronic infection on the left total knee replacement. She has not been following recently with infectious disease at [facility]. She  has some pain in the left knee although this is not major and not changed over the last recent months. Depth of the probing area is 1.9 cm READMISSION 03/10/18; this is a patient we have seen in this clinic several times before predominantly related to a draining sinus tract in the left popliteal fossa as part of a chronic infection in the left total knee replacement. She sought infectious disease in February. A discontinue doxycycline she was on at the time and continued her on long-standing prophylactic cephalexin which she is still on. 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 I see she was seen by pulmonary in April with chronic respiratory failure with hypoxia and cor pulmonale and pulmonary hypertension. She comes clinic this time with the sudden onset of 2 small wounds on the right anterior patella and the right anterior lower leg just below the tibial tuberosity. It is not really clear how this happened although presumably minor trauma that the patient didn't recognize. Her daughter states that she simply noticed blood on the sheets. She may have picked at her skin as well. I believe we've had superficial wounds on the patella previously in this clinic didn't seem to heal over fairly easily. The patient has not been systemically unwell no fever no chills she does not have a lot of pain in the knee just occasionally when she is up on it 03/17/18; this is a patient we have seen in this clinic previously. She has a chronically infected left total knee replacement that is not  felt by [facility] to be a candidate for joint replacement. Previous organism has been MSSA. She has a draining sinus in the left popliteal fossa. I think the area here is been stable. [name], [name]. ([id number]) She has superficial areas over the left patella and just below the tibial tuberosity. We've using silver collagen. The area over the patellas just about closed and these area under the tibial tuberosity is smaller this week. Objective Constitutional Sitting or standing Blood Pressure is within target range for patient.. Pulse regular and within target range for patient.Marland Kitchen Respirations regular, non-labored and within target range.. Temperature is normal and within the target range for the patient.Marland Kitchen appears in no distress. Vitals Time Taken: 11:11 AM, Height: 66 in, Temperature: 97.5 F, Pulse: 59 bpm, Respiratory Rate: 16 breaths/min, Blood Pressure: 125/60 mmHg. Eyes Conjunctivae clear. No discharge. Respiratory Respiratory effort is easy and symmetric bilaterally. Rate is normal at rest and on room air.. Cardiovascular pulses are palpable. Lymphatic none palpable in the popliteal area. Psychiatric No evidence of depression, anxiety, or agitation. Calm, cooperative, and communicative. Appropriate interactions and affect.. General Notes: wound exam; the original draining sinus tract remains intact. This probably should not close as it results in an almost abscess-like collection of fluid that I have had to IandD at some point in the past. The superficial area over the left patella is almost fully epithelialized The area below the tibial tuberosity is a lot smaller looks healthy. No debridement is required Integumentary (Hair, Skin) no primary skin issue. Wound #6 status is Open. Original cause of wound was Gradually Appeared. The wound is located on the Left Knee. The wound measures 1.2cm length x 0.4cm width x 0.1cm depth; 0.377cm^2 area and 0.038cm^3 volume. There is no  tunneling or undermining noted. There is a medium amount of serosanguineous drainage noted. The wound margin is distinct with the outline attached to the wound base. There is large (67-100%) red  granulation within the wound bed. There is no necrotic tissue within the wound bed. The periwound skin appearance exhibited: Excoriation. The periwound skin appearance did not exhibit: Callus, Crepitus, Induration, Rash, Scarring, Dry/Scaly, Maceration, Atrophie Blanche, Cyanosis, Ecchymosis, Hemosiderin Staining, Mottled, Pallor, Rubor, Erythema. Periwound temperature was noted as No Abnormality. The periwound has tenderness on palpation. Wound #7 status is Open. Original cause of wound was Gradually Appeared. The wound is located on the Left,Medial Lower Leg. The wound measures 1cm length x 0.9cm width x 0.1cm depth; 0.707cm^2 area and 0.071cm^3 volume. There is Fat AVALEIGH, [name]. ([id number]) Layer (Subcutaneous Tissue) Exposed exposed. There is no tunneling or undermining noted. There is a medium amount of serous drainage noted. The wound margin is distinct with the outline attached to the wound base. There is medium (34-66%) pink granulation within the wound bed. There is a medium (34-66%) amount of necrotic tissue within the wound bed including Adherent Slough. The periwound skin appearance did not exhibit: Callus, Crepitus, Excoriation, Induration, Rash, Scarring, Dry/Scaly, Maceration, Atrophie Blanche, Cyanosis, Ecchymosis, Hemosiderin Staining, Mottled, Pallor, Rubor, Erythema. Periwound temperature was noted as No Abnormality. The periwound has tenderness on palpation. Wound #8 status is Open. Original cause of wound was Surgical Injury. The wound is located on the Left,Posterior Knee. The wound measures 0.2cm length x 0.2cm width x 0.2cm depth; 0.031cm^2 area and 0.006cm^3 volume. There is no tunneling or undermining noted. There is a large amount of serous drainage noted. The wound margin is  distinct with the outline attached to the wound base. There is no granulation within the wound bed. There is no necrotic tissue within the wound bed. The periwound skin appearance did not exhibit: Callus, Crepitus, Excoriation, Induration, Rash, Scarring, Dry/Scaly, Maceration, Atrophie Blanche, Cyanosis, Ecchymosis, Hemosiderin Staining, Mottled, Pallor, Rubor, Erythema. Periwound temperature was noted as No Abnormality. The periwound has tenderness on palpation. 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 Wound #7 Left,Medial Lower Leg: Topical Lidocaine 4% cream applied to wound bed prior to debridement (In Clinic Only). Wound #8 Left,Posterior Knee: Topical Lidocaine 4% cream applied to wound bed prior to debridement (In Clinic Only). Primary Wound Dressing: Wound #6 Left Knee: Silver Collagen Wound #7 Left,Medial Lower Leg: Silver Collagen Secondary Dressing: Wound #6 Left Knee: [place] Wound #7 Left,Medial Lower Leg: [place] Wound #8 Left,Posterior Knee: KARON, COTTERILL ([id number]) [place] Dressing Change Frequency: Wound #6 Left Knee: Change dressing every other day. Wound #7 Left,Medial Lower Leg: Change dressing every other day. Wound #8 Left,Posterior Knee: Change dressing every other day. Follow-up Appointments: Wound #6 Left Knee: Return Appointment in 1 week. Wound #7 Left,Medial Lower Leg: Return Appointment in 1 week. Wound #8 Left,Posterior  Knee: Return Appointment in 1 week. continue with silver collagen with [place] over both wound areas. Follow-up in 2 weeks I expect these areas to be closed I have no expectation that the popliteal fossa draining sinus will close and my current thinking about this is that it really should not be allowed to. In the past when  this happens the patient ends up with a very painful swelling almost like an abscess but I think filled with infected synovial fluid Electronic Signature(s) Signed: 03/17/2018 5:10:30 PM By: Linton Ham MD Entered By: Linton Ham on 03/17/2018 12:38:56 Souders, Blima Singer ([id number]) -------------------------------------------------------------------------------- SuperBill Details Patient Name: [name]. Date of Service: 03/17/2018 Medical Record Number: [id number] Patient Account Number: [id number] Date of Birth/Sex: 10/02/1934 (84 y.o. 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 M71.062 Abscess of bursa, left knee Facility Procedures CPT4 Code: [id number] Description: 99213 - WOUND CARE VISIT-LEV 3 EST PT Modifier: Quantity: 1 Physician Procedures CPT4: Description Modifier Quantity Code [id number] - WC PHYS LEVEL 3 - EST PT 1 ICD-10 Diagnosis Description L97.928 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

## 2018-03-19 NOTE — Progress Notes (Signed)
TENISE, STETLER ([id number]) Visit Report for 03/17/2018 Arrival Information Details [name], [name]. Date of Service: 03/17/2018 10:15 AM Medical Record Number: [id number] Patient Account Number: [id number] Date of Birth/Sex: Nov 05, 1933 (84 y.o. F) Treating RN: Montey Hora Primary Care Lazariah Savard: Loura Pardon Other Clinician: Referring Sherrelle Prochazka: Loura Pardon Treating Dolph Tavano/Extender: Tito Dine in Treatment: 1 Visit Information History Since Last Visit Added or deleted any medications: No Patient Arrived: Walker Any new allergies or adverse reactions: No Arrival Time: 11:03 Had a fall or experienced change in No Accompanied By: dtr activities of daily living that may affect Transfer Assistance: None risk of falls: Patient Identification Verified: Yes Signs or symptoms of abuse/neglect since last visito No Secondary Verification Process Completed: Yes Hospitalized since last visit: No Implantable device outside of the clinic excluding No cellular tissue based products placed in the center since last visit: Has Dressing in Place as Prescribed: Yes Pain Present Now: No Electronic Signature(s) Signed: 03/17/2018 4:55:05 PM By: Montey Hora Entered By: Montey Hora on 03/17/2018 11:03:57 [name] ([id number]) -------------------------------------------------------------------------------- Clinic Level of Care Assessment Details [name]. Date of Service: 03/17/2018 10:15 AM Medical Record Number: [id number] Patient Account Number: [id number] Date of Birth/Sex: 08-21-1934 (84 y.o. F) Treating RN: Cornell Barman Primary Care Maymie Brunke: Loura Pardon Other Clinician: Referring Raissa Dam: Loura Pardon Treating Rogerio Boutelle/Extender: Tito Dine in Treatment: 1 Clinic Level of Care Assessment Items TOOL 4 Quantity Score []  - Use when only an EandM is performed on FOLLOW-UP visit 0 ASSESSMENTS - Nursing Assessment /  Reassessment []  - Reassessment of Co-morbidities (includes updates in patient status) 0 []  - 0 Reassessment of Adherence to Treatment Plan ASSESSMENTS - Wound and Skin Assessment / Reassessment []  - Simple Wound Assessment / Reassessment - one wound 0 X- 2 5 Complex Wound Assessment / Reassessment - multiple wounds []  - 0 Dermatologic / Skin Assessment (not related to wound area) ASSESSMENTS - Focused Assessment []  - Circumferential Edema Measurements - multi extremities 0 []  - 0 Nutritional Assessment / Counseling / Intervention []  - 0 Lower Extremity Assessment (monofilament, tuning fork, pulses) []  - 0 Peripheral Arterial Disease Assessment (using hand held doppler) ASSESSMENTS - Ostomy and/or Continence Assessment and Care []  - Incontinence Assessment and Management 0 []  - 0 Ostomy Care Assessment and Management (repouching, etc.) PROCESS - Coordination of Care X - Simple Patient / Family Education for ongoing care 1 15 []  - 0 Complex (extensive) Patient / Family Education for ongoing care []  - 0 Staff obtains Programmer, systems, Records, Test Results / Process Orders []  - 0 Staff telephones HHA, Nursing Homes / Clarify orders / etc []  - 0 Routine Transfer to another Facility (non-emergent condition) []  - 0 Routine Hospital Admission (non-emergent condition) []  - 0 New Admissions / Biomedical engineer / Ordering NPWT, Apligraf, etc. []  - 0 Emergency Hospital Admission (emergent condition) X- 1 10 Simple Discharge Coordination KHALILAH, HOKE ([id number]) []  - 0 Complex (extensive) Discharge Coordination PROCESS - Special Needs []  - Pediatric / Minor Patient Management 0 []  - 0 Isolation Patient Management []  - 0 Hearing / Language / Visual special needs []  - 0 Assessment of Community assistance (transportation, D/C planning, etc.) []  - 0 Additional assistance / Altered mentation []  - 0 Support Surface(s) Assessment (bed, cushion, seat, etc.) INTERVENTIONS - Wound  Cleansing / Measurement []  - Simple Wound Cleansing - one wound 0 X- 2 5 Complex Wound Cleansing - multiple wounds X- 1 5 Wound Imaging (photographs - any  number of wounds) []  - 0 Wound Tracing (instead of photographs) []  - 0 Simple Wound Measurement - one wound X- 2 5 Complex Wound Measurement - multiple wounds INTERVENTIONS - Wound Dressings []  - Small Wound Dressing one or multiple wounds 0 []  - 0 Medium Wound Dressing one or multiple wounds X- 2 20 Large Wound Dressing one or multiple wounds []  - 0 Application of Medications - topical []  - 0 Application of Medications - injection INTERVENTIONS - Miscellaneous []  - External ear exam 0 []  - 0 Specimen Collection (cultures, biopsies, blood, body fluids, etc.) []  - 0 Specimen(s) / Culture(s) sent or taken to Lab for analysis []  - 0 Patient Transfer (multiple staff / Civil Service fast streamer / Similar devices) []  - 0 Simple Staple / Suture removal (25 or less) []  - 0 Complex Staple / Suture removal (26 or more) []  - 0 Hypo / Hyperglycemic Management (close monitor of Blood Glucose) []  - 0 Ankle / Brachial Index (ABI) - do not check if billed separately []  - 0 Vital Signs Tadlock, ASHLEY BULTEMA ([id number]) Has the patient been seen at the hospital within the last three years: Yes Total Score: 100 Level Of Care: New/Established - Level 3 Electronic Signature(s) Signed: 03/17/2018 5:19:09 PM By: Gretta Cool, BSN, RN, CWS, Kim RN, BSN Entered By: Gretta Cool, BSN, RN, CWS, Kim on 03/17/2018 13:37:16 Cisse, Blima Singer ([id number]) -------------------------------------------------------------------------------- Encounter Discharge Information Details [name]. Date of Service: 03/17/2018 10:15 AM Medical Record Number: [id number] Patient Account Number: [id number] Date of Birth/Sex: 04/01/1934 (84 y.o. F) Treating RN: Montey Hora Primary Care Halee Glynn: Loura Pardon Other Clinician: Referring Teyon Odette: Loura Pardon Treating  Yacoub Diltz/Extender: Tito Dine in Treatment: 1 Encounter Discharge Information Items Discharge Condition: Stable Ambulatory Status: Walker Discharge Destination: Home Transportation: Private Auto Accompanied By: dtr Schedule Follow-up Appointment: Yes Clinical Summary of Care: Electronic Signature(s) Signed: 03/17/2018 1:04:08 PM By: Montey Hora Entered By: Montey Hora on 03/17/2018 13:04:08 [name] ([id number]) -------------------------------------------------------------------------------- Lower Extremity Assessment Details [name]. Date of Service: 03/17/2018 10:15 AM Medical Record Number: [id number] Patient Account Number: [id number] Date of Birth/Sex: 21-Mar-1934 (84 y.o. F) Treating RN: Montey Hora Primary Care Naz Denunzio: Loura Pardon Other Clinician: Referring Khloi Rawl: Loura Pardon Treating Ritha Sampedro/Extender: Tito Dine in Treatment: 1 Vascular Assessment Pulses: Dorsalis Pedis Palpable: [Left:Yes] Posterior Tibial Extremity colors, hair growth, and conditions: Extremity Color: [Left:Normal] Hair Growth on Extremity: [Left:Yes] Temperature of Extremity: [Left:Warm] Capillary Refill: [Left:< 3 seconds] Toe Nail Assessment Left: Right: Thick: Yes Discolored: Yes Deformed: No Improper Length and Hygiene: Yes Electronic Signature(s) Signed: 03/17/2018 4:55:05 PM By: Montey Hora Entered By: Montey Hora on 03/17/2018 11:15:03 [name] ([id number]) -------------------------------------------------------------------------------- Multi Wound Chart Details [name]. Date of Service: 03/17/2018 10:15 AM Medical Record Number: [id number] Patient Account Number: [id number] Date of Birth/Sex: Mar 02, 1934 (84 y.o. F) Treating RN: Cornell Barman Primary Care Yana Schorr: Loura Pardon Other Clinician: Referring Nasha Diss: Loura Pardon Treating Jaree Dwight/Extender: Tito Dine in  Treatment: 1 Vital Signs Height(in): 53 Pulse(bpm): 53 Weight(lbs): Blood Pressure(mmHg): 125/60 Body Mass Index(BMI): Temperature(F): 97.5 Respiratory Rate 16 (breaths/min): Photos: [6:No Photos] [7:No Photos] [8:No Photos] Wound Location: [6:Left Knee] [7:Left Lower Leg - Medial] [8:Left Knee - Posterior] Wounding Event: [6:Gradually Appeared] [7:Gradually Appeared] [8:Surgical Injury] Primary Etiology: [6:To be determined] [7:To be determined] [8:Infection - not elsewhere classified] Comorbid History: [6:Cataracts, Sleep Apnea, Arrhythmia, Hypertension, Scleroderma] [7:Cataracts, Sleep Apnea, Arrhythmia, Hypertension, Scleroderma] [8:Cataracts, Sleep Apnea, Arrhythmia, Hypertension, Scleroderma] Date Acquired: [6:02/24/2018] [  7:02/24/2018] [8:02/24/2018] Weeks of Treatment: [6:1] [7:1] [8:1] Wound Status: [6:Open] [7:Open] [8:Open] Measurements L x W x D [6:1.2x0.4x0.1] [7:1x0.9x0.1] [8:0.2x0.2x0.2] (cm) Area (cm) : [6:0.377] [7:0.707] [8:0.031] Volume (cm) : [6:0.038] [7:0.071] [8:0.006] % Reduction in Area: [6:50.00%] [7:41.60%] [8:56.30%] % Reduction in Volume: [6:49.30%] [7:41.30%] [8:57.10%] Classification: [6:Partial Thickness] [7:Full Thickness Without Exposed Support Structures] [8:Partial Thickness] Exudate Amount: [6:Medium] [7:Medium] [8:Large] Exudate Type: [6:Serosanguineous] [7:Serous] [8:Serous] Exudate Color: [6:red, brown] [7:amber] [8:amber] Wound Margin: [6:Distinct, outline attached] [7:Distinct, outline attached] [8:Distinct, outline attached] Granulation Amount: [6:Large (67-100%)] [7:Medium (34-66%)] [8:None Present (0%)] Granulation Quality: [6:Red] [7:Pink] [8:N/A] Necrotic Amount: [6:None Present (0%)] [7:Medium (34-66%)] [8:None Present (0%)] Exposed Structures: [6:Fascia: No Fat Layer (Subcutaneous Tissue) Exposed: No Tendon: No Muscle: No Joint: No Bone: No] [7:Fat Layer (Subcutaneous Tissue) Exposed: Yes Fascia: No Tendon: No Muscle: No Joint: No  Bone: No] [8:Fascia: No Fat Layer (Subcutaneous Tissue)  Exposed: No Tendon: No Muscle: No Joint: No Bone: No] Epithelialization: [6:Small (1-33%)] [7:Small (1-33%)] [8:None] Periwound Skin Texture: [6:Excoriation: Yes Induration: No Callus: No] [7:Excoriation: No Induration: No Callus: No] [8:Excoriation: No Induration: No Callus: No] Crepitus: No Crepitus: No Crepitus: No Rash: No Rash: No Rash: No Scarring: No Scarring: No Scarring: No Periwound Skin Moisture: Maceration: No Maceration: No Maceration: No Dry/Scaly: No Dry/Scaly: No Dry/Scaly: No Periwound Skin Color: Atrophie Blanche: No Atrophie Blanche: No Atrophie Blanche: No Cyanosis: No Cyanosis: No Cyanosis: No Ecchymosis: No Ecchymosis: No Ecchymosis: No Erythema: No Erythema: No Erythema: No Hemosiderin Staining: No Hemosiderin Staining: No Hemosiderin Staining: No Mottled: No Mottled: No Mottled: No Pallor: No Pallor: No Pallor: No Rubor: No Rubor: No Rubor: No Temperature: No Abnormality No Abnormality No Abnormality Tenderness on Palpation: Yes Yes Yes Wound Preparation: Ulcer Cleansing: Ulcer Cleansing: Ulcer Cleansing: Rinsed/Irrigated with Saline Rinsed/Irrigated with Saline Rinsed/Irrigated with Saline Topical Anesthetic Applied: Topical Anesthetic Applied: Topical Anesthetic Applied: None Other: lidocaine 4% None Treatment Notes Electronic Signature(s) Signed: 03/17/2018 5:10:30 PM By: Linton Ham MD Entered By: Linton Ham on 03/17/2018 12:34:02 [name] ([id number]) -------------------------------------------------------------------------------- [place] Details [name]. Date of Service: 03/17/2018 10:15 AM Medical Record Number: [id number] Patient Account Number: [id number] Date of Birth/Sex: 10/09/1934 (84 y.o. F) Treating RN: Cornell Barman Primary Care Joab Carden: Loura Pardon Other Clinician: Referring Cypher Paule: Loura Pardon Treating Aman Bonet/Extender: Tito Dine in Treatment: 1 Active Inactive ` Abuse / Safety / Falls / Self Care Management Nursing Diagnoses: History of Falls Goals: Patient will not experience any injury related to falls Date Initiated: 03/10/2018 Target Resolution Date: 04/09/2018 Goal Status: Active Patient will remain injury free related to falls Date Initiated: 03/10/2018 Target Resolution Date: 04/09/2018 Goal Status: Active Interventions: Assess fall risk on admission and as needed Notes: ` Orientation to the Wound Care Program Nursing Diagnoses: Knowledge deficit related to the wound healing center program Goals: Patient/caregiver will verbalize understanding of the [place] Date Initiated: 03/10/2018 Target Resolution Date: 03/22/2018 Goal Status: Active Interventions: Provide education on orientation to the wound center Notes: ` Venous Leg Ulcer Nursing Diagnoses: Knowledge deficit related to disease process and management Goals: Patient will maintain optimal edema control Date Initiated: 03/10/2018 Target Resolution Date: 03/26/2018 HAIZLEY, CANNELLA. ([id number]) Goal Status: Active Interventions: Assess peripheral edema status every visit. Notes: ` Wound/Skin Impairment Nursing Diagnoses: Impaired tissue integrity Goals: Ulcer/skin breakdown will have a volume reduction of 50% by week 8 Date Initiated: 03/10/2018 Target Resolution Date: 05/10/2018 Goal Status: Active Interventions: Assess ulceration(s) every visit Treatment Activities: Skin care  regimen initiated : 03/10/2018 Topical wound management initiated : 03/10/2018 Notes: Electronic Signature(s) Signed: 03/17/2018 5:19:09 PM By: Gretta Cool, BSN, RN, CWS, Kim RN, BSN Entered By: Gretta Cool, BSN, RN, CWS, Kim on 03/17/2018 11:36:08 [name] ([id number]) -------------------------------------------------------------------------------- Pain Assessment Details Patient  Name: [name]. Date of Service: 03/17/2018 10:15 AM Medical Record Number: [id number] Patient Account Number: [id number] Date of Birth/Sex: July 22, 1934 (84 y.o. F) Treating RN: Montey Hora Primary Care Ronne Savoia: Loura Pardon Other Clinician: Referring Mitsuo Budnick: Loura Pardon Treating Jeancarlos Marchena/Extender: Tito Dine in Treatment: 1 Active Problems Location of Pain Severity and Description of Pain Patient Has Paino Yes Site Locations Pain Location: Generalized Pain With Dressing Change: No Pain Management and Medication Current Pain Management: Notes hurts when standing Electronic Signature(s) Signed: 03/17/2018 4:55:05 PM By: Montey Hora Entered By: Montey Hora on 03/17/2018 11:04:28 [name] ([id number]) -------------------------------------------------------------------------------- Patient/Caregiver Education Details [name]. Date of Service: 03/17/2018 10:15 AM Medical Record Number: [id number] Patient Account Number: [id number] Date of Birth/Gender: 26-Nov-1933 (84 y.o. F) Treating RN: Montey Hora Primary Care Physician: Loura Pardon Other Clinician: Referring Physician: Loura Pardon Treating Physician/Extender: Tito Dine in Treatment: 1 Education Assessment Education Provided To: Patient Education Topics Provided Wound/Skin Impairment: Handouts: Other: wound care as ordered Methods: Demonstration, Explain/Verbal Responses: State content correctly Electronic Signature(s) Signed: 03/17/2018 4:55:05 PM By: Montey Hora Entered By: Montey Hora on 03/17/2018 13:04:29 [name] ([id number]) -------------------------------------------------------------------------------- Wound Assessment Details [name]. Date of Service: 03/17/2018 10:15 AM Medical Record Number: [id number] Patient Account Number: [id number] Date of Birth/Sex: [birth year] (84 y.o. F) Treating RN: Montey Hora Primary Care Tarrell Debes: Loura Pardon Other Clinician: Referring Koreen Lizaola: Loura Pardon Treating Lodie Waheed/Extender: Tito Dine in Treatment: 1 Wound Status Wound Number: 6 Primary To be determined Etiology: Wound Location: Left Knee Wound Status: Open Wounding Event: Gradually Appeared Comorbid Cataracts, Sleep Apnea, Arrhythmia, Date Acquired: 02/24/2018 History: Hypertension, Scleroderma Weeks Of Treatment: 1 Clustered Wound: No Photos Photo Uploaded By: Montey Hora on 03/17/2018 16:13:20 Wound Measurements Length: (cm) 1.2 Width: (cm) 0.4 Depth: (cm) 0.1 Area: (cm) 0.377 Volume: (cm) 0.038 % Reduction in Area: 50% % Reduction in Volume: 49.3% Epithelialization: Small (1-33%) Tunneling: No Undermining: No Wound Description Classification: Partial Thickness Wound Margin: Distinct, outline attached Exudate Amount: Medium Exudate Type: Serosanguineous Exudate Color: red, brown Foul Odor After Cleansing: No Slough/Fibrino Yes Wound Bed Granulation Amount: Large (67-100%) Exposed Structure Granulation Quality: Red Fascia Exposed: No Necrotic Amount: None Present (0%) Fat Layer (Subcutaneous Tissue) Exposed: No Tendon Exposed: No Muscle Exposed: No Joint Exposed: No Bone Exposed: No Periwound Skin Texture PAYSON, CRUMBY ([id number]) Texture Color No Abnormalities Noted: No No Abnormalities Noted: No Callus: No Atrophie Blanche: No Crepitus: No Cyanosis: No Excoriation: Yes Ecchymosis: No Induration: No Erythema: No Rash: No Hemosiderin Staining: No Scarring: No Mottled: No Pallor: No Moisture Rubor: No No Abnormalities Noted: No Dry / Scaly: No Temperature / Pain Maceration: No Temperature: No Abnormality Tenderness on Palpation: Yes Wound Preparation Ulcer Cleansing: Rinsed/Irrigated with Saline Topical Anesthetic Applied: None Treatment Notes Wound #6 (Left Knee) 1. Cleansed with: Clean wound with Normal Saline 2.  Anesthetic Topical Lidocaine 4% cream to wound bed prior to debridement 4. Dressing Applied: Prisma Ag 5. Secondary [place] Signature(s) Signed: 03/17/2018 4:55:05 PM By: Montey Hora Entered By: Montey Hora on 03/17/2018 11:13:09 [name] ([id number]) -------------------------------------------------------------------------------- Wound Assessment Details [name]. Date of Service: 03/17/2018 10:15 AM  Medical Record Number: [id number] Patient Account Number: [id number] Date of Birth/Sex: 1933/11/18 (84 y.o. F) Treating RN: Montey Hora Primary Care Masao Junker: Loura Pardon Other Clinician: Referring Emmalina Espericueta: Loura Pardon Treating Jasiyah Poland/Extender: Tito Dine in Treatment: 1 Wound Status Wound Number: 7 Primary To be determined Etiology: Wound Location: Left Lower Leg - Medial Wound Status: Open Wounding Event: Gradually Appeared Comorbid Cataracts, Sleep Apnea, Arrhythmia, Date Acquired: 02/24/2018 History: Hypertension, Scleroderma Weeks Of Treatment: 1 Clustered Wound: No Photos Photo Uploaded By: Montey Hora on 03/17/2018 16:13:21 Wound Measurements Length: (cm) 1 Width: (cm) 0.9 Depth: (cm) 0.1 Area: (cm) 0.707 Volume: (cm) 0.071 % Reduction in Area: 41.6% % Reduction in Volume: 41.3% Epithelialization: Small (1-33%) Tunneling: No Undermining: No Wound Description Full Thickness Without Exposed Support Classification: Structures Wound Margin: Distinct, outline attached Exudate Medium Amount: Exudate Type: Serous Exudate Color: amber Foul Odor After Cleansing: No Slough/Fibrino Yes Wound Bed Granulation Amount: Medium (34-66%) Exposed Structure Granulation Quality: Pink Fascia Exposed: No Necrotic Amount: Medium (34-66%) Fat Layer (Subcutaneous Tissue) Exposed: Yes Necrotic Quality: Adherent Slough Tendon Exposed: No Muscle Exposed: No Joint Exposed: No Bone Exposed:  No Minix, Blima Singer ([id number]) Periwound Skin Texture Texture Color No Abnormalities Noted: No No Abnormalities Noted: No Callus: No Atrophie Blanche: No Crepitus: No Cyanosis: No Excoriation: No Ecchymosis: No Induration: No Erythema: No Rash: No Hemosiderin Staining: No Scarring: No Mottled: No Pallor: No Moisture Rubor: No No Abnormalities Noted: No Dry / Scaly: No Temperature / Pain Maceration: No Temperature: No Abnormality Tenderness on Palpation: Yes Wound Preparation Ulcer Cleansing: Rinsed/Irrigated with Saline Topical Anesthetic Applied: Other: lidocaine 4%, Treatment Notes Wound #7 (Left, Medial Lower Leg) 1. Cleansed with: Clean wound with Normal Saline 2. Anesthetic Topical Lidocaine 4% cream to wound bed prior to debridement 4. Dressing Applied: Prisma Ag 5. Secondary [place] Signature(s) Signed: 03/17/2018 4:55:05 PM By: Montey Hora Entered By: Montey Hora on 03/17/2018 11:13:26 [name] ([id number]) -------------------------------------------------------------------------------- Wound Assessment Details [name]. Date of Service: 03/17/2018 10:15 AM Medical Record Number: [id number] Patient Account Number: [id number] Date of Birth/Sex: 1934/08/23 (84 y.o. F) Treating RN: Montey Hora Primary Care Diyan Dave: Loura Pardon Other Clinician: Referring Sriman Tally: Loura Pardon Treating Kdyn Vonbehren/Extender: Tito Dine in Treatment: 1 Wound Status Wound Number: 8 Primary Infection - not elsewhere classified Etiology: Wound Location: Left Knee - Posterior Wound Status: Open Wounding Event: Surgical Injury Comorbid Cataracts, Sleep Apnea, Arrhythmia, Date Acquired: 02/24/2018 History: Hypertension, Scleroderma Weeks Of Treatment: 1 Clustered Wound: No Wound under treatment by Demarques Pilz outside of [place] Uploaded By: Montey Hora on 03/17/2018  16:13:57 Wound Measurements Length: (cm) 0.2 Width: (cm) 0.2 Depth: (cm) 0.2 Area: (cm) 0.031 Volume: (cm) 0.006 % Reduction in Area: 56.3% % Reduction in Volume: 57.1% Epithelialization: None Tunneling: No Undermining: No Wound Description Classification: Partial Thickness Wound Margin: Distinct, outline attached Exudate Amount: Large Exudate Type: Serous Exudate Color: amber Foul Odor After Cleansing: No Slough/Fibrino No Wound Bed Granulation Amount: None Present (0%) Exposed Structure Necrotic Amount: None Present (0%) Fascia Exposed: No Fat Layer (Subcutaneous Tissue) Exposed: No Tendon Exposed: No Muscle Exposed: No Joint Exposed: No Bone Exposed: No Periwound Skin Texture Eleazer, Alyssia Jerilynn Mages ([id number]) Texture Color No Abnormalities Noted: No No Abnormalities Noted: No Callus: No Atrophie Blanche: No Crepitus: No Cyanosis: No Excoriation: No Ecchymosis: No Induration: No Erythema: No Rash: No Hemosiderin Staining: No Scarring: No Mottled: No Pallor: No Moisture Rubor: No No Abnormalities Noted: No Dry / Scaly:  No Temperature / Pain Maceration: No Temperature: No Abnormality Tenderness on Palpation: Yes Wound Preparation Ulcer Cleansing: Rinsed/Irrigated with Saline Topical Anesthetic Applied: None Treatment Notes Wound #8 (Left, Posterior Knee) 1. Cleansed with: Clean wound with Normal Saline 4. Dressing Applied: Dry Gauze 5. Secondary [place] Signature(s) Signed: 03/17/2018 4:55:05 PM By: Montey Hora Entered By: Montey Hora on 03/17/2018 11:13:41 [name] ([id number]) -------------------------------------------------------------------------------- [place] Details [name]. Date of Service: 03/17/2018 10:15 AM Medical Record Number: [id number] Patient Account Number: [id number] Date of Birth/Sex: 07-25-1934 (84 y.o. F) Treating RN: Montey Hora Primary Care Joslyne Marshburn:  Loura Pardon Other Clinician: Referring Flemon Kelty: Loura Pardon Treating Taheera Thomann/Extender: Tito Dine in Treatment: 1 Vital Signs Time Taken: 11:11 Temperature (F): 97.5 Height (in): 66 Pulse (bpm): 59 Respiratory Rate (breaths/min): 16 Blood Pressure (mmHg): 125/60 Reference Range: 80 - 120 mg / dl Electronic Signature(s) Signed: 03/17/2018 4:55:05 PM By: Montey Hora Entered By: Montey Hora on 03/17/2018 11:11:48

## 2018-03-24 ENCOUNTER — Ambulatory Visit: Payer: Self-pay | Admitting: Internal Medicine

## 2018-03-31 ENCOUNTER — Encounter: Payer: Medicare Other | Attending: Internal Medicine | Admitting: Internal Medicine

## 2018-03-31 DIAGNOSIS — Z96652 Presence of left artificial knee joint: Secondary | ICD-10-CM | POA: Insufficient documentation

## 2018-03-31 DIAGNOSIS — L97928 Non-pressure chronic ulcer of unspecified part of left lower leg with other specified severity: Secondary | ICD-10-CM | POA: Diagnosis present

## 2018-03-31 DIAGNOSIS — M71062 Abscess of bursa, left knee: Secondary | ICD-10-CM | POA: Insufficient documentation

## 2018-03-31 DIAGNOSIS — I272 Pulmonary hypertension, unspecified: Secondary | ICD-10-CM | POA: Insufficient documentation

## 2018-03-31 DIAGNOSIS — J9611 Chronic respiratory failure with hypoxia: Secondary | ICD-10-CM | POA: Insufficient documentation

## 2018-04-02 NOTE — Progress Notes (Signed)
[name], [name] ([id number]) Visit Report for 03/31/2018 Arrival Information Details [name], [name]. Date of Service: 03/31/2018 2:45 PM Medical Record Number: [id number] Patient Account Number: [id number] Date of Birth/Sex: 04-13-1934 (84 y.o. F) Treating RN: Roger Shelter Primary Care Dinero Chavira: Loura Pardon Other Clinician: Referring Dannika Hilgeman: Loura Pardon Treating Roda Lauture/Extender: Tito Dine in Treatment: 3 Visit Information History Since Last Visit All ordered tests and consults were completed: No Patient Arrived: Gilford Rile Added or deleted any medications: No Arrival Time: 14:45 Any new allergies or adverse reactions: No Accompanied By: daughter Had a fall or experienced change in No Transfer Assistance: None activities of daily living that may affect Patient Identification Verified: Yes risk of falls: Secondary Verification Process Completed: Yes Signs or symptoms of abuse/neglect since last visito No Hospitalized since last visit: No Implantable device outside of the clinic excluding No cellular tissue based products placed in the center since last visit: Pain Present Now: No Electronic Signature(s) Signed: 03/31/2018 5:08:02 PM By: Roger Shelter Entered By: Roger Shelter on 03/31/2018 14:45:55 Fluegel, Blima Singer ([id number]) -------------------------------------------------------------------------------- Clinic Level of Care Assessment Details [name]. Date of Service: 03/31/2018 2:45 PM Medical Record Number: [id number] Patient Account Number: [id number] Date of Birth/Sex: 1934-04-25 (84 y.o. F) Treating RN: Cornell Barman Primary Care Kamori Barbier: Loura Pardon Other Clinician: Referring Laureen Frederic: Loura Pardon Treating Myranda Pavone/Extender: Tito Dine in Treatment: 3 Clinic Level of Care Assessment Items TOOL 4 Quantity Score []  - Use when only an EandM is performed on FOLLOW-UP visit 0 ASSESSMENTS - Nursing  Assessment / Reassessment X - Reassessment of Co-morbidities (includes updates in patient status) 1 10 X- 1 5 Reassessment of Adherence to Treatment Plan ASSESSMENTS - Wound and Skin Assessment / Reassessment X - Simple Wound Assessment / Reassessment - one wound 1 5 []  - 0 Complex Wound Assessment / Reassessment - multiple wounds []  - 0 Dermatologic / Skin Assessment (not related to wound area) ASSESSMENTS - Focused Assessment []  - Circumferential Edema Measurements - multi extremities 0 []  - 0 Nutritional Assessment / Counseling / Intervention []  - 0 Lower Extremity Assessment (monofilament, tuning fork, pulses) []  - 0 Peripheral Arterial Disease Assessment (using hand held doppler) ASSESSMENTS - Ostomy and/or Continence Assessment and Care []  - Incontinence Assessment and Management 0 []  - 0 Ostomy Care Assessment and Management (repouching, etc.) PROCESS - Coordination of Care X - Simple Patient / Family Education for ongoing care 1 15 []  - 0 Complex (extensive) Patient / Family Education for ongoing care []  - 0 Staff obtains Programmer, systems, Records, Test Results / Process Orders []  - 0 Staff telephones HHA, Nursing Homes / Clarify orders / etc []  - 0 Routine Transfer to another Facility (non-emergent condition) []  - 0 Routine Hospital Admission (non-emergent condition) []  - 0 New Admissions / Biomedical engineer / Ordering NPWT, Apligraf, etc. []  - 0 Emergency Hospital Admission (emergent condition) X- 1 10 Simple Discharge Coordination DAVI, ROTAN ([id number]) []  - 0 Complex (extensive) Discharge Coordination PROCESS - Special Needs []  - Pediatric / Minor Patient Management 0 []  - 0 Isolation Patient Management []  - 0 Hearing / Language / Visual special needs []  - 0 Assessment of Community assistance (transportation, D/C planning, etc.) []  - 0 Additional assistance / Altered mentation []  - 0 Support Surface(s) Assessment (bed, cushion, seat,  etc.) INTERVENTIONS - Wound Cleansing / Measurement []  - Simple Wound Cleansing - one wound 0 X- 3 5 Complex Wound Cleansing - multiple wounds X- 1 5 Wound Imaging (  photographs - any number of wounds) []  - 0 Wound Tracing (instead of photographs) []  - 0 Simple Wound Measurement - one wound X- 3 5 Complex Wound Measurement - multiple wounds INTERVENTIONS - Wound Dressings []  - Small Wound Dressing one or multiple wounds 0 X- 2 15 Medium Wound Dressing one or multiple wounds []  - 0 Large Wound Dressing one or multiple wounds []  - 0 Application of Medications - topical []  - 0 Application of Medications - injection INTERVENTIONS - Miscellaneous []  - External ear exam 0 []  - 0 Specimen Collection (cultures, biopsies, blood, body fluids, etc.) []  - 0 Specimen(s) / Culture(s) sent or taken to Lab for analysis []  - 0 Patient Transfer (multiple staff / Civil Service fast streamer / Similar devices) []  - 0 Simple Staple / Suture removal (25 or less) []  - 0 Complex Staple / Suture removal (26 or more) []  - 0 Hypo / Hyperglycemic Management (close monitor of Blood Glucose) []  - 0 Ankle / Brachial Index (ABI) - do not check if billed separately X- 1 5 Vital Signs Minton, ALLYSSON RINEHIMER ([id number]) Has the patient been seen at the hospital within the last three years: Yes Total Score: 115 Level Of Care: New/Established - Level 3 Electronic Signature(s) Signed: 04/01/2018 2:15:42 PM By: Gretta Cool, BSN, RN, CWS, Kim RN, BSN Entered By: Gretta Cool, BSN, RN, CWS, Kim on 03/31/2018 15:08:39 [name] ([id number]) -------------------------------------------------------------------------------- Encounter Discharge Information Details [name]. Date of Service: 03/31/2018 2:45 PM Medical Record Number: [id number] Patient Account Number: [id number] Date of Birth/Sex: [birth year] (84 y.o. F) Treating RN: Montey Hora Primary Care Halea Lieb: Loura Pardon Other Clinician: Referring Zeev Deakins: Loura Pardon Treating Mckaela Howley/Extender: Tito Dine in Treatment: 3 Encounter Discharge Information Items Discharge Condition: Stable Ambulatory Status: Walker Discharge Destination: Home Transportation: Private Auto Accompanied By: dtr Schedule Follow-up Appointment: Yes Clinical Summary of Care: Electronic Signature(s) Signed: 03/31/2018 3:33:59 PM By: Montey Hora Entered By: Montey Hora on 03/31/2018 15:33:59 [name] ([id number]) -------------------------------------------------------------------------------- Lower Extremity Assessment Details [name]. Date of Service: 03/31/2018 2:45 PM Medical Record Number: [id number] Patient Account Number: [id number] Date of Birth/Sex: 20-May-1934 (84 y.o. F) Treating RN: Roger Shelter Primary Care Maud Rubendall: Loura Pardon Other Clinician: Referring Meda Dudzinski: Loura Pardon Treating Amire Leazer/Extender: Tito Dine in Treatment: 3 Edema Assessment Assessed: [Left: No] [Right: No] Edema: [Left: N] [Right: o] Vascular Assessment Claudication: Claudication Assessment [Left:None] Pulses: Dorsalis Pedis Palpable: [Left:Yes] Posterior Tibial Extremity colors, hair growth, and conditions: Extremity Color: [Left:Normal] Hair Growth on Extremity: [Left:Yes] Temperature of Extremity: [Left:Warm] Capillary Refill: [Left:< 3 seconds] Toe Nail Assessment Left: Right: Thick: No Discolored: No Deformed: No Improper Length and Hygiene: No Electronic Signature(s) Signed: 03/31/2018 5:08:02 PM By: Roger Shelter Entered By: Roger Shelter on 03/31/2018 14:56:16 Chou, Blima Singer ([id number]) -------------------------------------------------------------------------------- Multi Wound Chart Details [name]. Date of Service: 03/31/2018 2:45 PM Medical Record Number: [id number] Patient Account Number: [id number] Date of Birth/Sex: Sep 21, 1934 (84 y.o. F) Treating RN: Cornell Barman Primary Care Christo Hain: Loura Pardon Other Clinician: Referring Emelio Schneller: Loura Pardon Treating Edsel Shives/Extender: Tito Dine in Treatment: 3 Vital Signs Height(in): 66 Pulse(bpm): 27 Weight(lbs): Blood Pressure(mmHg): 132/84 Body Mass Index(BMI): Temperature(F): 98.2 Respiratory Rate 16 (breaths/min): Photos: [8:No Photos] Wound Location: Left Knee Left Lower Leg - Medial Left Knee - Posterior Wounding Event: Gradually Appeared Gradually Appeared Surgical Injury Primary Etiology: To be determined To be determined Infection - not elsewhere classified Comorbid History: Cataracts, Sleep Apnea, Cataracts, Sleep Apnea,  Cataracts, Sleep Apnea, Arrhythmia, Hypertension, Arrhythmia, Hypertension, Arrhythmia, Hypertension, Scleroderma Scleroderma Scleroderma Date Acquired: 02/24/2018 02/24/2018 02/24/2018 Weeks of Treatment: 3 3 3  Wound Status: Open Open Open Measurements L x W x D 2x1.6x0.1 0.7x0.4x0.1 0.2x0.2x0.2 (cm) Area (cm) : 2.513 0.22 0.031 Volume (cm) : 0.251 0.022 0.006 % Reduction in Area: -233.30% 81.80% 56.30% % Reduction in Volume: -234.70% 81.80% 57.10% Classification: Partial Thickness Full Thickness Without Partial Thickness Exposed Support Structures Exudate Amount: Medium Small Large Exudate Type: Serosanguineous Sanguinous Serous Exudate Color: red, brown red amber Wound Margin: Distinct, outline attached Distinct, outline attached Distinct, outline attached Granulation Amount: Large (67-100%) Large (67-100%) None Present (0%) Granulation Quality: Red Red, Pink N/A Necrotic Amount: Small (1-33%) Small (1-33%) None Present (0%) Necrotic Tissue: Eschar, Adherent Slough Eschar N/A Exposed Structures: Fascia: No Fat Layer (Subcutaneous Fascia: No Fat Layer (Subcutaneous Tissue) Exposed: Yes Fat Layer (Subcutaneous Tissue) Exposed: No Fascia: No Tissue) Exposed: No Mauriello, Claudia Jerilynn Mages ([id number]) Tendon: No Tendon: No Tendon: No Muscle:  No Muscle: No Muscle: No Joint: No Joint: No Joint: No Bone: No Bone: No Bone: No Epithelialization: Medium (34-66%) Large (67-100%) None Periwound Skin Texture: Scarring: Yes Scarring: Yes Excoriation: No Excoriation: No Excoriation: No Induration: No Induration: No Induration: No Callus: No Callus: No Callus: No Crepitus: No Crepitus: No Crepitus: No Rash: No Rash: No Rash: No Scarring: No Periwound Skin Moisture: Maceration: No Maceration: No Maceration: No Dry/Scaly: No Dry/Scaly: No Dry/Scaly: No Periwound Skin Color: Atrophie Blanche: No Atrophie Blanche: No Atrophie Blanche: No Cyanosis: No Cyanosis: No Cyanosis: No Ecchymosis: No Ecchymosis: No Ecchymosis: No Erythema: No Erythema: No Erythema: No Hemosiderin Staining: No Hemosiderin Staining: No Hemosiderin Staining: No Mottled: No Mottled: No Mottled: No Pallor: No Pallor: No Pallor: No Rubor: No Rubor: No Rubor: No Temperature: No Abnormality No Abnormality No Abnormality Tenderness on Palpation: Yes Yes Yes Wound Preparation: Ulcer Cleansing: Ulcer Cleansing: Ulcer Cleansing: Rinsed/Irrigated with Saline Rinsed/Irrigated with Saline Rinsed/Irrigated with Saline Topical Anesthetic Applied: Topical Anesthetic Applied: Topical Anesthetic Applied: None, Other: lidocaine 4% Other: lidocaine 4% None Treatment Notes Wound #6 (Left Knee) 1. Cleansed with: Clean wound with Normal Saline 2. Anesthetic Topical Lidocaine 4% cream to wound bed prior to debridement 4. Dressing Applied: Other dressing (specify in notes) 5. Secondary [place] Notes silvercel Wound #7 (Left, Medial Lower Leg) 1. Cleansed with: Clean wound with Normal Saline 2. Anesthetic Topical Lidocaine 4% cream to wound bed prior to debridement 4. Dressing Applied: Other dressing (specify in notes) 5. Secondary [place] Notes silvercel KAYLIEGH, BOYERS.  ([id number]) Wound #8 (Left, Posterior Knee) 1. Cleansed with: Clean wound with Normal Saline 2. Anesthetic Topical Lidocaine 4% cream to wound bed prior to debridement 4. Dressing Applied: Other dressing (specify in notes) 5. Secondary [place] Notes silvercel Electronic Signature(s) Signed: 03/31/2018 6:13:14 PM By: Linton Ham MD Entered By: Linton Ham on 03/31/2018 17:17:42 [name] ([id number]) -------------------------------------------------------------------------------- [place] Details Patient Name: SANVI, EHLER. Date of Service: 03/31/2018 2:45 PM Medical Record Number: [id number] Patient Account Number: [id number] Date of Birth/Sex: [birth year] (84 y.o. F) Treating RN: Cornell Barman Primary Care Able Malloy: Loura Pardon Other Clinician: Referring Savvas Roper: Loura Pardon Treating Desare Duddy/Extender: Tito Dine in Treatment: 3 Active Inactive ` Abuse / Safety / Falls / Self Care Management Nursing Diagnoses: History of Falls Goals: Patient will not experience any injury related to falls Date Initiated: 03/10/2018 Target Resolution Date: 04/09/2018 Goal Status: Active Patient will remain injury free  related to falls Date Initiated: 03/10/2018 Target Resolution Date: 04/09/2018 Goal Status: Active Interventions: Assess fall risk on admission and as needed Notes: ` Orientation to the Wound Care Program Nursing Diagnoses: Knowledge deficit related to the wound healing center program Goals: Patient/caregiver will verbalize understanding of the [place] Date Initiated: 03/10/2018 Target Resolution Date: 03/22/2018 Goal Status: Active Interventions: Provide education on orientation to the wound center Notes: ` Venous Leg Ulcer Nursing Diagnoses: Knowledge deficit related to disease process and management Goals: Patient will maintain optimal edema control Date Initiated:  03/10/2018 Target Resolution Date: 03/26/2018 ANGEL, HOBDY. ([id number]) Goal Status: Active Interventions: Assess peripheral edema status every visit. Notes: ` Wound/Skin Impairment Nursing Diagnoses: Impaired tissue integrity Goals: Ulcer/skin breakdown will have a volume reduction of 50% by week 8 Date Initiated: 03/10/2018 Target Resolution Date: 05/10/2018 Goal Status: Active Interventions: Assess ulceration(s) every visit Treatment Activities: Skin care regimen initiated : 03/10/2018 Topical wound management initiated : 03/10/2018 Notes: Electronic Signature(s) Signed: 04/01/2018 2:15:42 PM By: Gretta Cool, BSN, RN, CWS, Kim RN, BSN Entered By: Gretta Cool, BSN, RN, CWS, Kim on 03/31/2018 15:06:53 [name] ([id number]) -------------------------------------------------------------------------------- Pain Assessment Details [name]. Date of Service: 03/31/2018 2:45 PM Medical Record Number: [id number] Patient Account Number: [id number] Date of Birth/Sex: 1934-02-26 (84 y.o. F) Treating RN: Roger Shelter Primary Care Luvia Orzechowski: Loura Pardon Other Clinician: Referring Harlean Regula: Loura Pardon Treating Anglea Gordner/Extender: Tito Dine in Treatment: 3 Active Problems Location of Pain Severity and Description of Pain Patient Has Paino No Site Locations Pain Management and Medication Current Pain Management: Electronic Signature(s) Signed: 03/31/2018 5:08:02 PM By: Roger Shelter Entered By: Roger Shelter on 03/31/2018 14:46:04 [name] ([id number]) -------------------------------------------------------------------------------- Patient/Caregiver Education Details [name]. Date of Service: 03/31/2018 2:45 PM Medical Record Number: [id number] Patient Account Number: [id number] Date of Birth/Gender: Aug 23, 1934 (84 y.o. F) Treating RN: Montey Hora Primary Care Physician: Loura Pardon Other Clinician: Referring Physician:  Loura Pardon Treating Physician/Extender: Tito Dine in Treatment: 3 Education Assessment Education Provided To: Patient and Caregiver Education Topics Provided Wound/Skin Impairment: Handouts: Other: wound care as ordered Methods: Demonstration, Explain/Verbal Responses: State content correctly Electronic Signature(s) Signed: 03/31/2018 5:11:38 PM By: Montey Hora Entered By: Montey Hora on 03/31/2018 15:34:34 Vipond, Blima Singer ([id number]) -------------------------------------------------------------------------------- Wound Assessment Details [name]. Date of Service: 03/31/2018 2:45 PM Medical Record Number: [id number] Patient Account Number: [id number] Date of Birth/Sex: 08/03/1934 (84 y.o. F) Treating RN: Roger Shelter Primary Care Shadrick Senne: Loura Pardon Other Clinician: Referring Deavon Podgorski: Loura Pardon Treating Lucresia Simic/Extender: Tito Dine in Treatment: 3 Wound Status Wound Number: 6 Primary To be determined Etiology: Wound Location: Left Knee Wound Status: Open Wounding Event: Gradually Appeared Comorbid Cataracts, Sleep Apnea, Arrhythmia, Date Acquired: 02/24/2018 History: Hypertension, Scleroderma Weeks Of Treatment: 3 Clustered Wound: No Photos Photo Uploaded By: Roger Shelter on 03/31/2018 17:17:29 Wound Measurements Length: (cm) 2 Width: (cm) 1.6 Depth: (cm) 0.1 Area: (cm) 2.513 Volume: (cm) 0.251 % Reduction in Area: -233.3% % Reduction in Volume: -234.7% Epithelialization: Medium (34-66%) Tunneling: No Undermining: No Wound Description Classification: Partial Thickness Wound Margin: Distinct, outline attached Exudate Amount: Medium Exudate Type: Serosanguineous Exudate Color: red, brown Foul Odor After Cleansing: No Slough/Fibrino Yes Wound Bed Granulation Amount: Large (67-100%) Exposed Structure Granulation Quality: Red Fascia Exposed: No Necrotic Amount: Small (1-33%) Fat Layer  (Subcutaneous Tissue) Exposed: No Necrotic Quality: Eschar, Adherent Slough Tendon Exposed: No Muscle Exposed: No Joint Exposed: No Bone Exposed: No Periwound Skin  Texture MONICE, LUNDY ([id number]) Texture Color No Abnormalities Noted: No No Abnormalities Noted: No Callus: No Atrophie Blanche: No Crepitus: No Cyanosis: No Excoriation: No Ecchymosis: No Induration: No Erythema: No Rash: No Hemosiderin Staining: No Scarring: Yes Mottled: No Pallor: No Moisture Rubor: No No Abnormalities Noted: No Dry / Scaly: No Temperature / Pain Maceration: No Temperature: No Abnormality Tenderness on Palpation: Yes Wound Preparation Ulcer Cleansing: Rinsed/Irrigated with Saline Topical Anesthetic Applied: None, Other: lidocaine 4%, Treatment Notes Wound #6 (Left Knee) 1. Cleansed with: Clean wound with Normal Saline 2. Anesthetic Topical Lidocaine 4% cream to wound bed prior to debridement 4. Dressing Applied: Other dressing (specify in notes) 5. Secondary [place] Notes silvercel Electronic Signature(s) Signed: 03/31/2018 5:08:02 PM By: Roger Shelter Entered By: Roger Shelter on 03/31/2018 14:51:12 [name] ([id number]) -------------------------------------------------------------------------------- Wound Assessment Details [name]. Date of Service: 03/31/2018 2:45 PM Medical Record Number: [id number] Patient Account Number: [id number] Date of Birth/Sex: 1934/01/19 (84 y.o. F) Treating RN: Roger Shelter Primary Care Jailen Coward: Loura Pardon Other Clinician: Referring Jessen Siegman: Loura Pardon Treating Hubbard Seldon/Extender: Tito Dine in Treatment: 3 Wound Status Wound Number: 7 Primary To be determined Etiology: Wound Location: Left Lower Leg - Medial Wound Status: Open Wounding Event: Gradually Appeared Comorbid Cataracts, Sleep Apnea, Arrhythmia, Date Acquired: 02/24/2018 History: Hypertension,  Scleroderma Weeks Of Treatment: 3 Clustered Wound: No Photos Photo Uploaded By: Roger Shelter on 03/31/2018 17:17:29 Wound Measurements Length: (cm) 0.7 Width: (cm) 0.4 Depth: (cm) 0.1 Area: (cm) 0.22 Volume: (cm) 0.022 % Reduction in Area: 81.8% % Reduction in Volume: 81.8% Epithelialization: Large (67-100%) Tunneling: No Undermining: No Wound Description Full Thickness Without Exposed Support Classification: Structures Wound Margin: Distinct, outline attached Exudate Small Amount: Exudate Type: Sanguinous Exudate Color: red Foul Odor After Cleansing: No Slough/Fibrino Yes Wound Bed Granulation Amount: Large (67-100%) Exposed Structure Granulation Quality: Red, Pink Fascia Exposed: No Necrotic Amount: Small (1-33%) Fat Layer (Subcutaneous Tissue) Exposed: Yes Necrotic Quality: Eschar Tendon Exposed: No Muscle Exposed: No Joint Exposed: No Bone Exposed: No Lehane, Blima Singer ([id number]) Periwound Skin Texture Texture Color No Abnormalities Noted: No No Abnormalities Noted: No Callus: No Atrophie Blanche: No Crepitus: No Cyanosis: No Excoriation: No Ecchymosis: No Induration: No Erythema: No Rash: No Hemosiderin Staining: No Scarring: Yes Mottled: No Pallor: No Moisture Rubor: No No Abnormalities Noted: No Dry / Scaly: No Temperature / Pain Maceration: No Temperature: No Abnormality Tenderness on Palpation: Yes Wound Preparation Ulcer Cleansing: Rinsed/Irrigated with Saline Topical Anesthetic Applied: Other: lidocaine 4%, Treatment Notes Wound #7 (Left, Medial Lower Leg) 1. Cleansed with: Clean wound with Normal Saline 2. Anesthetic Topical Lidocaine 4% cream to wound bed prior to debridement 4. Dressing Applied: Other dressing (specify in notes) 5. Secondary [place] Notes silvercel Electronic Signature(s) Signed: 03/31/2018 5:08:02 PM By: Roger Shelter Entered By: Roger Shelter on 03/31/2018  14:52:28 [name] ([id number]) -------------------------------------------------------------------------------- Wound Assessment Details [name]. Date of Service: 03/31/2018 2:45 PM Medical Record Number: [id number] Patient Account Number: [id number] Date of Birth/Sex: 06-23-1934 (84 y.o. F) Treating RN: Roger Shelter Primary Care Marceil Welp: Loura Pardon Other Clinician: Referring Saadia Dewitt: Loura Pardon Treating Zakariyah Freimark/Extender: Tito Dine in Treatment: 3 Wound Status Wound Number: 8 Primary Infection - not elsewhere classified Etiology: Wound Location: Left Knee - Posterior Wound Status: Open Wounding Event: Surgical Injury Comorbid Cataracts, Sleep Apnea, Arrhythmia, Date Acquired: 02/24/2018 History: Hypertension, Scleroderma Weeks Of Treatment: 3 Clustered Wound: No Wound under treatment by Kelia Gibbon  outside of [place] Uploaded By: Roger Shelter on 03/31/2018 17:17:48 Wound Measurements Length: (cm) 0.2 Width: (cm) 0.2 Depth: (cm) 0.2 Area: (cm) 0.031 Volume: (cm) 0.006 % Reduction in Area: 56.3% % Reduction in Volume: 57.1% Epithelialization: None Tunneling: No Undermining: No Wound Description Classification: Partial Thickness Wound Margin: Distinct, outline attached Exudate Amount: Large Exudate Type: Serous Exudate Color: amber Foul Odor After Cleansing: No Slough/Fibrino No Wound Bed Granulation Amount: None Present (0%) Exposed Structure Necrotic Amount: None Present (0%) Fascia Exposed: No Fat Layer (Subcutaneous Tissue) Exposed: No Tendon Exposed: No Muscle Exposed: No Joint Exposed: No Bone Exposed: No Periwound Skin Texture ELIANY, MCCARTER ([id number]) Texture Color No Abnormalities Noted: No No Abnormalities Noted: No Callus: No Atrophie Blanche: No Crepitus: No Cyanosis: No Excoriation: No Ecchymosis: No Induration: No Erythema: No Rash: No Hemosiderin Staining:  No Scarring: No Mottled: No Pallor: No Moisture Rubor: No No Abnormalities Noted: No Dry / Scaly: No Temperature / Pain Maceration: No Temperature: No Abnormality Tenderness on Palpation: Yes Wound Preparation Ulcer Cleansing: Rinsed/Irrigated with Saline Topical Anesthetic Applied: None Treatment Notes Wound #8 (Left, Posterior Knee) 1. Cleansed with: Clean wound with Normal Saline 2. Anesthetic Topical Lidocaine 4% cream to wound bed prior to debridement 4. Dressing Applied: Other dressing (specify in notes) 5. Secondary [place] Notes silvercel Electronic Signature(s) Signed: 03/31/2018 5:08:02 PM By: Roger Shelter Entered By: Roger Shelter on 03/31/2018 14:54:11 [name] ([id number]) -------------------------------------------------------------------------------- Vitals Details [name]. Date of Service: 03/31/2018 2:45 PM Medical Record Number: [id number] Patient Account Number: [id number] Date of Birth/Sex: August 08, 1934 (84 y.o. F) Treating RN: Roger Shelter Primary Care Nandika Stetzer: Loura Pardon Other Clinician: Referring Shatarra Wehling: Loura Pardon Treating Keiosha Cancro/Extender: Tito Dine in Treatment: 3 Vital Signs Time Taken: 14:46 Temperature (F): 98.2 Height (in): 66 Pulse (bpm): 78 Respiratory Rate (breaths/min): 16 Blood Pressure (mmHg): 132/84 Reference Range: 80 - 120 mg / dl Electronic Signature(s) Signed: 03/31/2018 5:08:02 PM By: Roger Shelter Entered By: [email or web address] on 03/31/2018 14:48:54

## 2018-04-02 NOTE — Progress Notes (Signed)
[name], [name] ([id number]) Visit Report for 03/31/2018 HPI Details [name], [name]. Date of Service: 03/31/2018 2:45 PM Medical Record Number: [id number] Patient Account Number: [id number] Date of Birth/Sex: 1933-11-12 (84 y.o. F) Treating RN: Cornell Barman Primary Care Provider: Loura Pardon Other Clinician: Referring Provider: Loura Pardon Treating Provider/Extender: Tito Dine in Treatment: 3 History of Present Illness HPI Description: 10/28/16; this is an 84 year old woman who arrives with a complicated medical issue. 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 Initially in 2000 and apparently most recently in 2014. 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 Indeed she was hospitalized on 02/13/16 and discharged on 02/19/16. She was taken to the OR and it was found that the popliteal abscess was communicating with her joint space. Signed to feel fluid grew MSSA. Patient was treated with IandD of the abscess. She completed IV cefazolin on 03/28/16 and since then has been on Keflex and rifampin for chronic suppression. Her infectious disease doctor is Dr.Shrestha at [facility]. The patient was seen in our [place] sister clinic through much of October and November 2017. At that point she had a draining sinus that eventually closed over. She noted some pain behind her knee surrounding Christmas day and was seen in our emergency room on 10/23/16. During this ER visit she underwent tendon incision and drainage of a left Baker's cyst and clear colored synovial fluid was obtained. Doxycycline was added for a week to her Keflex and rifampin. A CT scan was ordered but I don't think was ever done due to artifacts from hardware. Her wound culture was negative. She has been using iodoform packing. She states her knee is painful when she stands on it for a period of time. She is not systemically unwell. She has a history of scleroderma  or without is been under control for some period of time. 11/04/16 the culture that I did of this area last week showed coag-negative staph and a few enterococcus faecium. Enterococcus is ampicillin resistant. The coag negative staph is only sensitive to vancomycin and tetracycline. Previous joint infection was methicillin sensitive staph aureus. It seems I misunderstood what she said and that her infectious disease clinic appointment is this Friday and the orthopedic appointment was last Friday. I faxed the culture results to the infectious disease clinic but I've given him a copy of it today. Per the patient and her family they orthopedic surgeon did not want to do anything further to this joint. The patient is not systemically unwell in particular no fever or chills and the pain she has is really minimal 11/18/16; this is a patient who has an infected left knee prosthetic joint. She went to see infectious disease at [facility] who apparently did did not recommend any further antibiotic therapy. She continues to feel the same episodic pain but no systemic symptoms of particular no fever. The depth of the wound in the popliteal fossa of her left knee is 3.5 versus 5 cm last week 11/25/16 4.5 cm of depth today. Draining exit site. We have treating this with iodoform packing however I really have no expectation of healing and to be truthful the amount of drainage that is coming out of this knee I don't think closure of this site is necessarily considered a positive outcome. Her left knee remains warm but not painful there is an effusion she has no systemic symptoms no fever no chills or and she feels well 12/31/16. Still is  same amount of depth today using a skinny at 4.6 cm. There is less drainage. We have been using iodoform packing and I really have no expectation of healing and to be truthful closing this sinus probably is really not the best thing to consider. The patient has an infected artificial knee  joint on the left. She is not felt to be a candidate for a fourth replacement. In the meantime she does not complain of pain except when the dressing is being changed episodically. She has not been systemically unwell no fever or chills or other issues. 01/14/17; after the patient left on 3/7 her daughter tells me the next day the wound had closed over and therefore this is not intact since. Over the last 3 days the patient has noted significant pain in the popliteal fossa of the involved left knee. 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 MSSA was the cause of the original identified joint infection treated at [facility]. 01/21/17; I saw this patient last week at which time there was intense erythema in the popliteal fossa, tenderness and a ballotable fluid collection. I did an IandD on this. Surprisingly the culture of this was negative although as a result of the IandD her tenderness and erythema in the area is resolved. We continue with the iodoform packing READMISSION 04/07/17; this is a patient I know from 2 prior admissions to this clinic starting in January of this year. Her history is essentially as noted above. 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 ([id number]) suppressive antibiotics. She has a draining sinus into the popliteal fossa of the left knee. This remains open and drains. She has been using iodoform packing to this area with gauze over the top. To be truthful I haven't really been certain whether having this area close would be helpful or harmful to this patient. Previously when it's closed she develops a lot more pain in the back of the popliteal fossa and more pain in the knee. She is here today because the packing of the area has become increasingly painful in fact the patient describes this as a "torture chamber" experience. They  are packing this wound twice a day. The patient follows with orthopedic surgery and infectious disease at [facility] although she hasn't seen them since she was last here in March. She denies any systemic symptoms including fever chills etc. She states except for the changing of the dressing her pain when she is mobilizing on the knee is 2/10. She also has a area on the left anterior patella in the incision that is excoriated open. The patient states this is itchy and she does a lot of scratching. They state this was here last time she was here but I have no recollection or documentation of this 04/21/17; patient was readmitted to clinic 2 weeks ago. She has a draining sinus in the left popliteal fossa and a chronically infected left total knee replacement. She also had an excoriation on the left anterior knee. She arrives in clinic today with all of this looking considerably better. The divot in the popliteal fossa is actually closed. READMISSION 07/15/17; the patient arrived today with a real opening of the draining sinus in her left popliteal fossa. This opened about a week ago after developing a painful "knot". Is been draining clear fluid. The patient's pain is a lot better. She is not  been systemically unwell. oShe also is been dealing with an area on the left anterior leg just below the tibial plateau. She also had this last time. Her daughter states that she scratches this area continuously. I have reviewed the last infectious disease note from August. 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 Much to my chagrin they seem reluctant to tap the joint. A culture of purulent material that came out of the popliteal fossa earlier this year cultured coag-negative staph aureus [not an uncommon cause of prosthetic joint infection]. However they really ignored this and they state that the source was questionable. If they  believe this I am wondering why they're so reluctant to tap the fluid. She has not seen orthopedics 07/22/17; patient's sinus tract in the popliteal fossa about 2.5 cm. The area on the anterior part of her knee looks a lot better. We have been using iodoform packing to the popliteal fossa, collagen to the anterior part of her knee. She is not experiencing undue pain or systemic symptoms 08/05/17; patient has a sinus tract in the popliteal fossa on the left. 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 She does not have any options for further replacements she is only been offered an above-knee amputation. 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 She has not been systemically unwell no fever. There is some pain and swelling in the knee but all in all this is not unbearable. They've been using iodoform packing 09/02/17; continued sinus tract on the popliteal fossa on the left as part of a chronic infection on a left total knee replacement. She follows with infectious disease at [facility] although the patient and her daughter are not exactly sure when her next appointment is. She has not been systemically unwell specifically denies fever chills. She has some pain in the left knee however she is reluctant to take even simple analgesics because of the risk of" addiction" 09/30/17; continued sinus tract in the popliteal fossa on the left as part of a chronic infection on the left total knee replacement. She has not been following recently with infectious disease at [facility]. She has some pain in the left knee although this is not major and not changed over the last recent months. Depth of the probing area is 1.9 cm READMISSION 03/10/18; this is a patient we have seen in this clinic several times before predominantly related to a draining sinus tract in the left popliteal fossa as part of a chronic infection in the left total knee replacement. She sought  infectious disease in February. A discontinue doxycycline she was on at the time and continued her on long-standing prophylactic cephalexin which she is still on. 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 I see she was seen by pulmonary in April with chronic respiratory failure with hypoxia and cor pulmonale and pulmonary hypertension. She comes clinic this time with the sudden onset of 2 small wounds on the right anterior patella and the right anterior lower leg just below the tibial tuberosity. It is not really clear how this happened although presumably minor trauma that the patient didn't recognize. Her daughter states that she simply noticed blood on the sheets. She may have picked at her skin as well. I believe we've had superficial wounds on the patella previously in this clinic didn't seem to heal over fairly  easily. The patient has not been systemically unwell no fever no chills she does not have a lot of pain in the knee just occasionally when she is up on it 03/17/18; this is a patient we have seen in this clinic previously. She has a chronically infected left total knee replacement that [name], [name]. ([id number]) is not felt by [facility] to be a candidate for joint replacement. Previous organism has been MSSA. She has a draining sinus in the left popliteal fossa. I think the area here is been stable. She has superficial areas over the left patella and just below the tibial tuberosity. We've using silver collagen. The area over the patellas just about closed and these area under the tibial tuberosity is smaller this week. 03/31/18; patient with a chronically infected left total knee replacement that is not felt by [facility] to be candidate for joint replacement. She has a draining area in the popliteal fossa which is chronically allowed to drain. She has not tolerated when this closes oShe has a superficial area on the left patella and one just under the left  tibial tuberosity. Electronic Signature(s) Signed: 03/31/2018 6:13:14 PM By: Linton Ham MD Entered By: Linton Ham on 03/31/2018 17:18:41 [name] ([id number]) -------------------------------------------------------------------------------- Physical Exam Details [name], [name]. Date of Service: 03/31/2018 2:45 PM Medical Record Number: [id number] Patient Account Number: [id number] Date of Birth/Sex: 1934-04-29 (84 y.o. F) Treating RN: Cornell Barman Primary Care Provider: Loura Pardon Other Clinician: Referring Provider: Loura Pardon Treating Provider/Extender: Tito Dine in Treatment: 3 Constitutional Sitting or standing Blood Pressure is within target range for patient.. Pulse regular and within target range for patient.Marland Kitchen Respirations regular, non-labored and within target range.. Temperature is normal and within the target range for the patient.Marland Kitchen appears in no distress. Notes would exam; the original draining sinus remains intact. oThe superficial area over the left patella is about 75% epithelialized oThe area below the tibial tuberosity looks about 100% epithelialized today Electronic Signature(s) Signed: 03/31/2018 6:13:14 PM By: Linton Ham MD Entered By: Linton Ham on 03/31/2018 17:19:26 [name] ([id number]) -------------------------------------------------------------------------------- Physician Orders Details [name]. Date of Service: 03/31/2018 2:45 PM Medical Record Number: [id number] Patient Account Number: [id number] Date of Birth/Sex: 10/24/1934 (84 y.o. F) Treating RN: Cornell Barman Primary Care Provider: Loura Pardon Other Clinician: Referring Provider: Loura Pardon Treating Provider/Extender: Tito Dine in Treatment: 3 Verbal / Phone Orders: No Diagnosis Coding Wound Cleansing Wound #6 Left Knee o Cleanse wound with mild soap and water Wound #7 Left,Medial Lower Leg o Cleanse  wound with mild soap and water Wound #8 Left,Posterior Knee o Cleanse wound with mild soap and water Anesthetic (add to Medication List) Wound #6 Left Knee o Topical Lidocaine 4% cream applied to wound bed prior to debridement (In Clinic Only). Wound #7 Left,Medial Lower Leg o Topical Lidocaine 4% cream applied to wound bed prior to debridement (In Clinic Only). Wound #8 Left,Posterior Knee o Topical Lidocaine 4% cream applied to wound bed prior to debridement (In Clinic Only). Primary Wound Dressing Wound #6 Left Knee o Silver Alginate Wound #7 Left,Medial Lower Leg o Silver Alginate Wound #8 Left,Posterior Knee o Silver Alginate Secondary Dressing Wound #6 Left Knee o [place] Wound #7 Left,Medial Lower Leg o [place] #8 [place] Dressing Change Frequency Wound #6 Left Knee o Change dressing every other day. [name], [name]. ([id number]) Wound #7 Left,Medial Lower Leg o Change dressing every other  day. Wound #8 Left,Posterior Knee o Change dressing every other day. Follow-up Appointments Wound #6 Left Knee o Return Appointment in 1 week. Wound #7 Left,Medial Lower Leg o Return Appointment in 1 week. Wound #8 Left,Posterior Knee o Return Appointment in 1 week. Electronic Signature(s) Signed: 03/31/2018 6:13:14 PM By: Linton Ham MD Signed: 04/01/2018 2:15:42 PM By: Gretta Cool, BSN, RN, CWS, Kim RN, BSN Entered By: Gretta Cool, BSN, RN, CWS, Kim on 03/31/2018 15:08:03 [name], [name] ([id number]) -------------------------------------------------------------------------------- Problem List Details [name], [name]. Date of Service: 03/31/2018 2:45 PM Medical Record Number: [id number] Patient Account Number: [id number] Date of Birth/Sex: [birth year] (84 y.o. 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 Date of Service: 03/31/2018 2:45 PM Medical Record Number: [id number] Patient Account Number: [id number] Date of Birth/Sex: [birth year] (84 y.o. F) Treating RN: Cornell Barman Primary Care Provider: Loura Pardon Other Clinician: Referring Provider: Loura Pardon Treating Provider/Extender: Tito Dine in Treatment: 3 Subjective History of Present Illness (HPI) 10/28/16; this is an 84 year old woman who arrives with a complicated medical issue. 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 Initially in 2000 and apparently most recently in 2014. 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 Indeed she was hospitalized on 02/13/16 and discharged on 02/19/16. She was taken to the OR and it was found that the popliteal abscess was communicating with her joint space. Signed to feel fluid grew MSSA. Patient was treated with IandD of the abscess. She completed IV cefazolin on 03/28/16 and since then has been on Keflex and rifampin for chronic suppression. Her infectious disease doctor is Dr.Shrestha at [facility]. The patient was seen in our [place] sister clinic through much of October and November 2017. At that point she had a draining sinus that eventually closed over. She noted some pain behind her knee surrounding  Christmas day and was seen in our emergency room on 10/23/16. During this ER visit she underwent tendon incision and drainage of a left Baker's cyst and clear colored synovial fluid was obtained. Doxycycline was added for a week to her Keflex and rifampin. A CT scan was ordered but I don't think was ever done due to artifacts from hardware. Her wound culture was negative. She has been using iodoform packing. She states her knee is painful when she stands on it for a period of time. She is not systemically unwell. She has a history of scleroderma or without is been under control for some period of time. 11/04/16 the culture that I did of this area last week showed coag-negative staph and a few enterococcus faecium. Enterococcus is ampicillin resistant. The coag negative staph is only sensitive to vancomycin and tetracycline. Previous joint infection was methicillin sensitive staph aureus. It seems I misunderstood what she said and that her infectious disease clinic appointment is this Friday and the orthopedic appointment was last Friday. I faxed the culture results to the infectious disease clinic but I've given  him a copy of it today. Per the patient and her family they orthopedic surgeon did not want to do anything further to this joint. The patient is not systemically unwell in particular no fever or chills and the pain she has is really minimal 11/18/16; this is a patient who has an infected left knee prosthetic joint. She went to see infectious disease at [facility] who apparently did did not recommend any further antibiotic therapy. She continues to feel the same episodic pain but no systemic symptoms of particular no fever. The depth of the wound in the popliteal fossa of her left knee is 3.5 versus 5 cm last week 11/25/16 4.5 cm of depth today. Draining exit site. We have treating this with iodoform packing however I really have no expectation of healing and to be truthful the amount of drainage  that is coming out of this knee I don't think closure of this site is necessarily considered a positive outcome. Her left knee remains warm but not painful there is an effusion she has no systemic symptoms no fever no chills or and she feels well 12/31/16. Still is same amount of depth today using a skinny at 4.6 cm. There is less drainage. We have been using iodoform packing and I really have no expectation of healing and to be truthful closing this sinus probably is really not the best thing to consider. 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 She is not felt to be a candidate for a fourth replacement. In the meantime she does not complain of pain except when the dressing is being changed episodically. She has not been systemically unwell no fever or chills or other issues. 01/14/17; after the patient left on 3/7 her daughter tells me the next day the wound had closed over and therefore this is not intact since. Over the last 3 days the patient has noted significant pain in the popliteal fossa of the involved left knee. 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 MSSA was the cause of the original identified joint infection treated at [facility]. 01/21/17; I saw this patient last week at which time there was intense erythema in the popliteal fossa, tenderness and a ballotable fluid collection. I did an IandD on this. Surprisingly the culture of this was negative although as a result of the IandD her tenderness and erythema in the area is resolved. We continue with the iodoform packing READMISSION 04/07/17; this is a patient I know from 2 prior admissions to this clinic starting in January of this year. Her history is essentially as noted above. 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 She has a draining sinus into the popliteal  fossa of the left knee. This remains open and drains. She has been using iodoform packing to this area with gauze over the top. To be truthful I haven't really been certain whether [name], [name]. ([id number]) having this area close would be helpful or harmful to this patient. Previously when it's closed she develops a lot more pain in the back of the popliteal fossa and more pain in the knee. She is here today because the packing of the area has become increasingly painful in fact the patient describes this as a "torture chamber" experience. They are packing this wound twice a day. The patient follows with orthopedic surgery and infectious disease at [facility]  although she hasn't seen them since she was last here in March. She denies any systemic symptoms including fever chills etc. She states except for the changing of the dressing her pain when she is mobilizing on the knee is 2/10. She also has a area on the left anterior patella in the incision that is excoriated open. The patient states this is itchy and she does a lot of scratching. They state this was here last time she was here but I have no recollection or documentation of this 04/21/17; patient was readmitted to clinic 2 weeks ago. She has a draining sinus in the left popliteal fossa and a chronically infected left total knee replacement. She also had an excoriation on the left anterior knee. She arrives in clinic today with all of this looking considerably better. The divot in the popliteal fossa is actually closed. READMISSION 07/15/17; the patient arrived today with a real opening of the draining sinus in her left popliteal fossa. This opened about a week ago after developing a painful "knot". Is been draining clear fluid. The patient's pain is a lot better. She is not been systemically unwell. She also is been dealing with an area on the left anterior leg just below the tibial plateau. She also had this last time. Her daughter states  that she scratches this area continuously. I have reviewed the last infectious disease note from August. 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 Much to my chagrin they seem reluctant to tap the joint. A culture of purulent material that came out of the popliteal fossa earlier this year cultured coag-negative staph aureus [not an uncommon cause of prosthetic joint infection]. However they really ignored this and they state that the source was questionable. If they believe this I am wondering why they're so reluctant to tap the fluid. She has not seen orthopedics 07/22/17; patient's sinus tract in the popliteal fossa about 2.5 cm. The area on the anterior part of her knee looks a lot better. We have been using iodoform packing to the popliteal fossa, collagen to the anterior part of her knee. She is not experiencing undue pain or systemic symptoms 08/05/17; patient has a sinus tract in the popliteal fossa on the left. 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 She does not have any options for further replacements she is only been offered an above-knee amputation. 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 She has not been systemically unwell no fever. There is some pain and swelling in the knee but all in all this is not unbearable. They've been using iodoform packing 09/02/17; continued sinus tract on the popliteal fossa on the left as part of a chronic infection on a left total knee replacement. She follows with infectious disease at [facility] although the patient and her daughter are not exactly sure when her next appointment is. She has not been systemically unwell specifically denies fever chills. She has some pain in the left knee however she is reluctant to take even simple analgesics because of the risk of" addiction" 09/30/17; continued sinus tract in the  popliteal fossa on the left as part of a chronic infection on the left total knee replacement. She has not been following recently with infectious disease at [facility]. She has some pain in the left knee although this is not major and not changed over the last recent months.  Depth of the probing area is 1.9 cm READMISSION 03/10/18; this is a patient we have seen in this clinic several times before predominantly related to a draining sinus tract in the left popliteal fossa as part of a chronic infection in the left total knee replacement. She sought infectious disease in February. A discontinue doxycycline she was on at the time and continued her on long-standing prophylactic cephalexin which she is still on. 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 I see she was seen by pulmonary in April with chronic respiratory failure with hypoxia and cor pulmonale and pulmonary hypertension. She comes clinic this time with the sudden onset of 2 small wounds on the right anterior patella and the right anterior lower leg just below the tibial tuberosity. It is not really clear how this happened although presumably minor trauma that the patient didn't recognize. Her daughter states that she simply noticed blood on the sheets. She may have picked at her skin as well. I believe we've had superficial wounds on the patella previously in this clinic didn't seem to heal over fairly easily. The patient has not been systemically unwell no fever no chills she does not have a lot of pain in the knee just occasionally when she is up on it 03/17/18; this is a patient we have seen in this clinic previously. She has a chronically infected left total knee replacement that is not felt by [facility] to be a candidate for joint replacement. Previous organism has been MSSA. She has a draining sinus in the left popliteal fossa. I think the area here is been stable. [name], [name]. ([id number]) She has  superficial areas over the left patella and just below the tibial tuberosity. We've using silver collagen. The area over the patellas just about closed and these area under the tibial tuberosity is smaller this week. 03/31/18; patient with a chronically infected left total knee replacement that is not felt by [facility] to be candidate for joint replacement. She has a draining area in the popliteal fossa which is chronically allowed to drain. She has not tolerated when this closes She has a superficial area on the left patella and one just under the left tibial tuberosity. Objective Constitutional Sitting or standing Blood Pressure is within target range for patient.. Pulse regular and within target range for patient.Marland Kitchen Respirations regular, non-labored and within target range.. Temperature is normal and within the target range for the patient.Marland Kitchen appears in no distress. Vitals Time Taken: 2:46 PM, Height: 66 in, Temperature: 98.2 F, Pulse: 78 bpm, Respiratory Rate: 16 breaths/min, Blood Pressure: 132/84 mmHg. General Notes: would exam; the original draining sinus remains intact. The superficial area over the left patella is about 75% epithelialized The area below the tibial tuberosity looks about 100% epithelialized today Integumentary (Hair, Skin) Wound #6 status is Open. Original cause of wound was Gradually Appeared. The wound is located on the Left Knee. The wound measures 2cm length x 1.6cm width x 0.1cm depth; 2.513cm^2 area and 0.251cm^3 volume. There is no tunneling or undermining noted. There is a medium amount of serosanguineous drainage noted. The wound margin is distinct with the outline attached to the wound base. There is large (67-100%) red granulation within the wound bed. There is a small (1-33%) amount of necrotic tissue within the wound bed including Eschar and Adherent Slough. The periwound skin appearance exhibited: Scarring. The periwound skin appearance did not exhibit: Callus,  Crepitus, Excoriation, Induration, Rash, Dry/Scaly, Maceration, Atrophie Blanche,  Cyanosis, Ecchymosis, Hemosiderin Staining, Mottled, Pallor, Rubor, Erythema. Periwound temperature was noted as No Abnormality. The periwound has tenderness on palpation. Wound #7 status is Open. Original cause of wound was Gradually Appeared. The wound is located on the Left,Medial Lower Leg. The wound measures 0.7cm length x 0.4cm width x 0.1cm depth; 0.22cm^2 area and 0.022cm^3 volume. There is Fat Layer (Subcutaneous Tissue) Exposed exposed. There is no tunneling or undermining noted. There is a small amount of sanguinous drainage noted. The wound margin is distinct with the outline attached to the wound base. There is large (67- 100%) red, pink granulation within the wound bed. There is a small (1-33%) amount of necrotic tissue within the wound bed including Eschar. The periwound skin appearance exhibited: Scarring. The periwound skin appearance did not exhibit: Callus, Crepitus, Excoriation, Induration, Rash, Dry/Scaly, Maceration, Atrophie Blanche, Cyanosis, Ecchymosis, Hemosiderin Staining, Mottled, Pallor, Rubor, Erythema. Periwound temperature was noted as No Abnormality. The periwound has tenderness on palpation. Wound #8 status is Open. Original cause of wound was Surgical Injury. The wound is located on the Left,Posterior Knee. The wound measures 0.2cm length x 0.2cm width x 0.2cm depth; 0.031cm^2 area and 0.006cm^3 volume. There is no tunneling or undermining noted. There is a large amount of serous drainage noted. The wound margin is distinct with the outline attached to the wound base. There is no granulation within the wound bed. There is no necrotic tissue within the wound bed. The periwound skin appearance did not exhibit: Callus, Crepitus, Excoriation, Induration, Rash, Scarring, Dry/Scaly, Maceration, Atrophie Blanche, Cyanosis, Ecchymosis, Hemosiderin Staining, Mottled, Pallor, Rubor, Erythema.  [place] ([id number]) temperature was noted as No Abnormality. The periwound has tenderness on palpation. 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 Wound #7 Left,Medial Lower Leg: Topical Lidocaine 4% cream applied to wound bed prior to debridement (In Clinic Only). Wound #8 Left,Posterior Knee: Topical Lidocaine 4% cream applied to wound bed prior to debridement (In Clinic Only). Primary Wound Dressing: Wound #6 Left Knee: Silver Alginate Wound #7 Left,Medial Lower Leg: Silver Alginate Wound #8 Left,Posterior Knee: Silver Alginate Secondary Dressing: Wound #6 Left Knee: [place] Wound #7 Left,Medial Lower Leg: [place] Wound #8 Left,Posterior Knee: [place] Dressing Change Frequency: Wound #6 Left Knee: Change dressing every other day. Wound #7 Left,Medial Lower Leg: Change dressing every other day. Wound #8 Left,Posterior Knee: Change dressing every other day. Follow-up Appointments: Wound #6 Left Knee: Return Appointment in 1 week. [name], [name]. ([id number]) Wound #7 Left,Medial Lower Leg: Return Appointment in 1 week. Wound #8 Left,Posterior Knee: Return Appointment in 1 week. #1continue with the silver alginate/[place] #2 as usual he asked me about a connection with the underlying infection in the joint with these superficial anterior wounds are, I really don't think there is one Electronic Signature(s) Signed: 03/31/2018 6:13:14 PM By: Linton Ham  MD Entered By: Linton Ham on 03/31/2018 17:20:16 [name] ([id number]) -------------------------------------------------------------------------------- SuperBill Details [name]. Date of Service: 03/31/2018 Medical Record Number: [id number] Patient Account Number: [id number] Date of Birth/Sex: August 03, 1934 (84  y.o. 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

## 2018-04-07 ENCOUNTER — Encounter: Payer: Self-pay | Admitting: Family Medicine

## 2018-04-07 ENCOUNTER — Ambulatory Visit (INDEPENDENT_AMBULATORY_CARE_PROVIDER_SITE_OTHER): Payer: Medicare Other | Admitting: Family Medicine

## 2018-04-07 VITALS — BP 128/68 | HR 57 | Temp 97.4°F | Ht 66.0 in | Wt 212.2 lb

## 2018-04-07 DIAGNOSIS — R5381 Other malaise: Secondary | ICD-10-CM | POA: Diagnosis not present

## 2018-04-07 DIAGNOSIS — M25562 Pain in left knee: Secondary | ICD-10-CM

## 2018-04-07 DIAGNOSIS — G8929 Other chronic pain: Secondary | ICD-10-CM | POA: Diagnosis not present

## 2018-04-07 DIAGNOSIS — R2689 Other abnormalities of gait and mobility: Secondary | ICD-10-CM | POA: Diagnosis not present

## 2018-04-07 DIAGNOSIS — I10 Essential (primary) hypertension: Secondary | ICD-10-CM | POA: Diagnosis not present

## 2018-04-07 DIAGNOSIS — F411 Generalized anxiety disorder: Secondary | ICD-10-CM

## 2018-04-07 MED ORDER — FLUOXETINE HCL 40 MG PO CAPS: 40.0000 mg | ORAL_CAPSULE | Freq: Every day | ORAL | 11 refills | Status: AC

## 2018-04-07 NOTE — Progress Notes (Signed)
Subjective:    Patient ID: [name], female    DOB: 07-02-1934, 84 y.o.   MRN: [id number]  HPI  Here for symptoms of anxiety and weakness and fatigue   Wt Readings from Last 3 Encounters:  04/07/18 212 lb 4 oz (96.3 kg)  02/08/18 211 lb 9.6 oz (96 kg)  01/12/18 213 lb 8 oz (96.8 kg)  appetite is good  Able to sleep through the night  34.26 kg/m   Family is concerned  Got a little better and worse again after last prozac change  More anxious Jumpy Tearful  Triggers include- getting out/getting ready to get out  Moving in general makes her stressed  Leg continues to hurt    Takes prozac 20 mg daily  No side effects  Still goes to her counselor - not as often   [facility] to [facility] the senior center    Hesitant to move  (pain and anxiety)  Has hand weights but she does not use them - it is boring  Sits all day in recliner  Does not want to get out   Getting weaker  Poor balance  Resistant to do PT   Knees hurt more  Still watching her leg infection- 2 more open places / goes to wound doctor very regularly  Still on keflex   bp is stable today  No cp or palpitations or headaches or edema  No side effects to medicines  BP Readings from Last 3 Encounters:  04/07/18 128/68  02/08/18 102/66  01/12/18 130/74     Lab Results  Component Value Date   TSH 4.45 01/12/2018    Lab Results  Component Value Date   WBC 5.0 06/30/2017   HGB 14.2 06/30/2017   HCT 43.9 06/30/2017   MCV 92.4 06/30/2017   PLT 230.0 06/30/2017   Lab Results  Component Value Date   CREATININE 0.98 01/12/2018   BUN 24 (H) 01/12/2018   NA 140 01/12/2018   K 4.0 01/12/2018   CL 98 01/12/2018   CO2 35 (H) 01/12/2018   Patient Active Problem List   Diagnosis Date Noted  . Left knee pain 04/07/2018  . Cor pulmonale (chronic) ([place]) 02/08/2018  . ILD (interstitial lung disease) ([place]) 10/20/2017  . Hypothyroid 07/03/2017  . B12 deficiency 07/03/2017  . Encounter for audiology  evaluation 07/03/2017  . Encounter for screening mammogram for breast cancer 07/03/2017  . Tearfulness 06/28/2017  . Memory loss 06/28/2017  . Poor balance 09/26/2016  . Neck pain 07/30/2016  . Diarrhea 07/30/2016  . Routine general medical examination at a health care facility 06/26/2016  . Elevated TSH 06/25/2016  . Generalized anxiety disorder 06/04/2016  . Intertrigo 06/04/2016  . Chronic cough 05/01/2016  . Constipation 01/09/2016  . Umbilical hernia 36/46/8032  . Chronic respiratory failure ([place]) 10/05/2015  . Thromboembolism of vein 10/02/2015  . Infection of prosthetic joint ([place]) 09/18/2015  . Infection of prosthetic left knee joint ([place]) 09/12/2015  . Effusion of knee 08/30/2015  . Pulmonary hypertension ([place])   . Physical deconditioning 02/25/2015  . RBBB-new 02/12/2015  . History of bilat PE after TKR (off Coumadin Dec 2015) 02/12/2015  . Abnormal EKG-TWI, RBBB, prolonged QT   . Hernia, umbilical 10/19/8249  . Hx of colonic polyps 10/24/2014  . Diverticulosis of colon without hemorrhage   . OSA (obstructive sleep apnea) 07/22/2014  . Unspecified arthropathy, lower leg 03/21/2014  . Status post surgery 03/15/2014  . Arthritis of knee 03/09/2014  . Solitary  pulmonary nodule 05/03/2013  . Sternal fracture 04/26/2013  . History of DVT (deep vein thrombosis) 11/24/2012  . History of artificial joint 10/29/2012  . 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 Complications due to internal joint prosthesis ([place]) 09/07/2012  . PES PLANUS 08/15/2010  . URINARY FREQUENCY, CHRONIC 07/14/2008  . Dyslipidemia 07/09/2007  . Essential hypertension 07/09/2007  . PSVT with SSS 02/23/2007  . DEGENERATIVE JOINT DISEASE 02/23/2007  . SPINAL STENOSIS, LUMBAR 02/23/2007  . History of osteopenia 02/23/2007  . SPONDYLOLISTHESIS 02/23/2007   Past Medical History:  Diagnosis Date  . Anemia   . Arthritis    knee  . Diverticulosis   . Hx of colonic polyps 10/24/2014  . Hyperlipidemia     . Hypertension   . Osteopenia   . Overactive bladder    urge incontinence  . PSVT (paroxysmal supraventricular tachycardia) ([place])   . Pulmonary emboli ([place])   . Pulmonary hypertension ([place])   . Right ventricular failure ([place])   . Sleep apnea    CPAP  . Spinal stenosis    Past Surgical History:  Procedure Laterality Date  . BLADDER SUSPENSION  1993  . CARDIAC CATHETERIZATION N/A 02/28/2015   Procedure: Right/Left Heart Cath and Coronary Angiography;  Surgeon: Troy Sine, MD;  Location: [facility]AS[facility]UP[facility];  Service: Cardiovascular;  Laterality: N/A;  . CATARACT EXTRACTION Bilateral 2008  . CHOLECYSTECTOMY    . COLONOSCOPY    . COLONOSCOPY N/A 10/24/2014   Procedure: COLONOSCOPY;  Surgeon: Gatha Mayer, MD;  Location: [facility] ENDOSCOPY;  Service: Endoscopy;  Laterality: N/A;  . CYST REMOVAL LEG Left 02/15/2016   cyst removal from back of left knee  . ELECTROPHYSIOLOGIC STUDY N/A 03/02/2015   Procedure: SVT Ablation;  Surgeon: Evans Lance, MD;  Location: [facility] CV LAB;  Service: Cardiovascular;  Laterality: N/A;  . INCISIONAL HERNIA REPAIR    . RIGHT HEART CATHETERIZATION N/A 07/04/2014   Procedure: RIGHT HEART CATH;  Surgeon: Jolaine Artist, MD;  Location: [facility] CATH LAB;  Service: Cardiovascular;  Laterality: N/A;  . TONSILLECTOMY AND ADENOIDECTOMY    . TOTAL KNEE ARTHROPLASTY Left 2000  . TOTAL KNEE ARTHROPLASTY Left 10/29/2012   at [facility]  . TOTAL KNEE ARTHROPLASTY Right 03/09/2014  . TOTAL KNEE REVISION  01/12/2012   Procedure: TOTAL KNEE REVISION;  Surgeon: Kerin Salen, MD;  Location: [facility];  Service: Orthopedics;  Laterality: Left;  DEPUY/ LCS , HAND SET  . TRIGGER FINGER RELEASE  01/12/2012   Procedure: RELEASE TRIGGER FINGER/A-1 PULLEY;  Surgeon: Kerin Salen, MD;  Location: [facility];  Service: Orthopedics;  Laterality: Right;   Social History   Tobacco Use  . Smoking status: Never Smoker  . Smokeless tobacco: Never Used  Substance Use Topics  . Alcohol use: No     Alcohol/week: 0.0 oz  . Drug use: No   Family History  Problem Relation Age of Onset  . Breast cancer Mother 9  . Heart disease Father   . Diabetes Father   . Hypertension Father   . Uterine cancer Sister    Allergies  Allergen Reactions  . Prednisone Other (See Comments)    hyperctivity  . Zithromax [Azithromycin] Other (See Comments)    Prolonged QT on EKG  . Penicillins Hives and Swelling    Questionable allergy (?) Currently on cefazolin, tolerating well since 4/21 Patient is tolerating dicloxacillin without issue. No skin changes or other concerns.   . Sulfonamide Derivatives Hives  . Adhesive [Tape] Other (  See Comments) and Rash    redness   Current Outpatient Medications on File Prior to Visit  Medication Sig Dispense Refill  . benzonatate (TESSALON) 200 MG capsule Take 1 capsule (200 mg total) by mouth 2 (two) times daily as needed for cough. 180 capsule 3  . Calcium Carb-Cholecalciferol (CALCIUM 600/VITAMIN D3) 600-800 MG-UNIT TABS Take 1 tablet by mouth 2 (two) times daily.    . cephALEXin (KEFLEX) 500 MG capsule 500 mg 3 (three) times daily.     . Cholecalciferol (VITAMIN D-1000 MAX ST) 1000 UNITS tablet Take 1,000 Units by mouth daily.    . hydrochlorothiazide (HYDRODIURIL) 25 MG tablet TAKE 1 TABLET DAILY 90 tablet 3  . levothyroxine (SYNTHROID, LEVOTHROID) 25 MCG tablet Take 1 tablet (25 mcg total) by mouth daily before breakfast. 30 tablet 11  . metoprolol tartrate (LOPRESSOR) 25 MG tablet TAKE ONE-HALF (1/2) OF A TABLET BY MOUTHTWICE DAILY *PT DUE FOR APPT,MUST CALL TO SCHEDULE [phone]!* 15 tablet 0  . naproxen sodium (ANAPROX) 220 MG tablet Take 220 mg by mouth daily.     . OXYGEN Inhale 2 L into the lungs at bedtime.    Vladimir Faster Glycol-Propyl Glycol (SYSTANE) 0.4-0.3 % SOLN Apply 1 drop to eye 2 (two) times daily.     No current facility-administered medications on file prior to visit.     Review of Systems  Constitutional: Positive for fatigue.  Negative for activity change, appetite change, fever and unexpected weight change.  HENT: Negative for congestion, ear pain, rhinorrhea, sinus pressure and sore throat.   Eyes: Negative for pain, redness and visual disturbance.  Respiratory: Negative for cough, shortness of breath and wheezing.   Cardiovascular: Negative for chest pain and palpitations.  Gastrointestinal: Negative for abdominal pain, blood in stool, constipation and diarrhea.  Endocrine: Negative for polydipsia and polyuria.  Genitourinary: Negative for dysuria, frequency and urgency.  Musculoskeletal: Positive for arthralgias and joint swelling. Negative for back pain and myalgias.  Skin: Negative for pallor and rash.  Allergic/Immunologic: Negative for environmental allergies.  Neurological: Negative for dizziness, syncope and headaches.  Hematological: Negative for adenopathy. Does not bruise/bleed easily.  Psychiatric/Behavioral: Positive for dysphoric mood. Negative for decreased concentration, self-injury and suicidal ideas. The patient is nervous/anxious.        General lack of motivation       Objective:   Physical Exam  Constitutional: She appears well-developed and well-nourished. No distress.  Frail appearing elderly female sitting in chair   HENT:  Head: Normocephalic and atraumatic.  Mouth/Throat: Oropharynx is clear and moist.  Eyes: Pupils are equal, round, and reactive to light. Conjunctivae and EOM are normal.  Neck: Normal range of motion. Neck supple. No JVD present. Carotid bruit is not present. No thyromegaly present.  Cardiovascular: Normal rate, regular rhythm, normal heart sounds and intact distal pulses. Exam reveals no gallop.  Pulmonary/Chest: Effort normal and breath sounds normal. No respiratory distress. She has no wheezes. She has no rales.  No crackles  Abdominal: Soft. Bowel sounds are normal. She exhibits no distension, no abdominal bruit and no mass. There is no tenderness.    Musculoskeletal: She exhibits no edema.  Chronic infection and swelling of L knee   Lymphadenopathy:    She has no cervical adenopathy.  Neurological: She is alert. She has normal reflexes. She displays normal reflexes. No cranial nerve deficit. Coordination normal.  No tremor   Unable to rise from chair or walk w/o assistance  Poor balance   Skin: Skin is  warm and dry. No rash noted.  Psychiatric: Her speech is normal and behavior is normal. Thought content normal. Her mood appears anxious. She exhibits a depressed mood.  [place] anxiety  Not tearful today          Assessment & Plan:   Problem List Items Addressed This Visit      Cardiovascular and Mediastinum   Essential hypertension    bp in fair control at this time  BP Readings from Last 1 Encounters:  04/07/18 128/68   No changes needed Most recent labs reviewed  Disc lifstyle change with low sodium diet and exercise          Other   Generalized anxiety disorder - Primary    Worse lately  Some stressors  Will inc prozac to 40 mg  Family to watch carefully  Reviewed stressors/ coping techniques/symptoms/ support sources/ tx options and side effects in detail today Discussed expectations of SSRI medication including time to effectiveness and mechanism of action, also poss of side effects (early and late)- including mental fuzziness, weight or appetite change, nausea and poss of worse dep or anxiety (even suicidal thoughts)  Pt voiced understanding and will stop med and update if this occurs        Relevant Medications   FLUoxetine (PROZAC) 40 MG capsule   Left knee pain   Relevant Orders   Ambulatory referral to Physical Therapy   Physical deconditioning    Due to pain and dis motivation-not moving much and getting weaker  Needs enc  Ref for PT        Relevant Orders   Ambulatory referral to Physical Therapy   Poor balance    With gen physical deconditioning Chronic leg/knee pain and  infection play a role Ref to PT      Relevant Orders   Ambulatory referral to Physical Therapy

## 2018-04-07 NOTE — Patient Instructions (Addendum)
Let's increase your prozac to 40 mg  Let me know if any side effects  Continue counseling  Try hard to get out more   Lets refer you for physical therapy - tell Rosaria Ferries you want to stick with [facility]   Follow up here in about 2-3 months (earlier if problems or not improving)

## 2018-04-09 ENCOUNTER — Ambulatory Visit: Payer: Self-pay | Admitting: Psychology

## 2018-04-11 NOTE — Assessment & Plan Note (Signed)
With gen physical deconditioning Chronic leg/knee pain and infection play a role Ref to PT

## 2018-04-11 NOTE — Assessment & Plan Note (Signed)
Worse lately  Some stressors  Will inc prozac to 40 mg  Family to watch carefully  Reviewed stressors/ coping techniques/symptoms/ support sources/ tx options and side effects in detail today Discussed expectations of SSRI medication including time to effectiveness and mechanism of action, also poss of side effects (early and late)- including mental fuzziness, weight or appetite change, nausea and poss of worse dep or anxiety (even suicidal thoughts)  Pt voiced understanding and will stop med and update if this occurs

## 2018-04-11 NOTE — Assessment & Plan Note (Signed)
Due to pain and dis motivation-not moving much and getting weaker  Needs enc  Ref for PT

## 2018-04-11 NOTE — Assessment & Plan Note (Signed)
bp in fair control at this time  BP Readings from Last 1 Encounters:  04/07/18 128/68   No changes needed Most recent labs reviewed  Disc lifstyle change with low sodium diet and exercise

## 2018-04-12 ENCOUNTER — Ambulatory Visit: Payer: Medicare Other | Admitting: Psychology

## 2018-04-14 ENCOUNTER — Encounter: Payer: Medicare Other | Admitting: Internal Medicine

## 2018-04-14 ENCOUNTER — Ambulatory Visit (INDEPENDENT_AMBULATORY_CARE_PROVIDER_SITE_OTHER): Payer: Medicare Other | Admitting: Psychology

## 2018-04-14 DIAGNOSIS — F4321 Adjustment disorder with depressed mood: Secondary | ICD-10-CM

## 2018-04-14 DIAGNOSIS — L97928 Non-pressure chronic ulcer of unspecified part of left lower leg with other specified severity: Secondary | ICD-10-CM | POA: Diagnosis not present

## 2018-04-16 NOTE — Progress Notes (Signed)
KANEESHA, CONSTANTINO ([id number]) Visit Report for 04/14/2018 HPI Details [name], [name]. Date of Service: 04/14/2018 11:15 AM Medical Record Number: [id number] Patient Account Number: [id number] Date of Birth/Sex: 08/05/1934 (84 y.o. F) Treating RN: Primary Care Provider: Loura Pardon Other Clinician: Referring Provider: Loura Pardon Treating Provider/Extender: Tito Dine in Treatment: 5 History of Present Illness HPI Description: 10/28/16; this is an 84 year old woman who arrives with a complicated medical issue. 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 Initially in 2000 and apparently most recently in 2014. 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 Indeed she was hospitalized on 02/13/16 and discharged on 02/19/16. She was taken to the OR and it was found that the popliteal abscess was communicating with her joint space. Signed to feel fluid grew MSSA. Patient was treated with IandD of the abscess. She completed IV cefazolin on 03/28/16 and since then has been on Keflex and rifampin for chronic suppression. Her infectious disease doctor is Dr.Shrestha at [facility]. The patient was seen in our [place] sister clinic through much of October and November 2017. At that point she had a draining sinus that eventually closed over. She noted some pain behind her knee surrounding Christmas day and was seen in our emergency room on 10/23/16. During this ER visit she underwent tendon incision and drainage of a left Baker's cyst and clear colored synovial fluid was obtained. Doxycycline was added for a week to her Keflex and rifampin. A CT scan was ordered but I don't think was ever done due to artifacts from hardware. Her wound culture was negative. She has been using iodoform packing. She states her knee is painful when she stands on it for a period of time. She is not systemically unwell. She has a history of scleroderma or without  is been under control for some period of time. 11/04/16 the culture that I did of this area last week showed coag-negative staph and a few enterococcus faecium. Enterococcus is ampicillin resistant. The coag negative staph is only sensitive to vancomycin and tetracycline. Previous joint infection was methicillin sensitive staph aureus. It seems I misunderstood what she said and that her infectious disease clinic appointment is this Friday and the orthopedic appointment was last Friday. I faxed the culture results to the infectious disease clinic but I've given him a copy of it today. Per the patient and her family they orthopedic surgeon did not want to do anything further to this joint. The patient is not systemically unwell in particular no fever or chills and the pain she has is really minimal 11/18/16; this is a patient who has an infected left knee prosthetic joint. She went to see infectious disease at [facility] who apparently did did not recommend any further antibiotic therapy. She continues to feel the same episodic pain but no systemic symptoms of particular no fever. The depth of the wound in the popliteal fossa of her left knee is 3.5 versus 5 cm last week 11/25/16 4.5 cm of depth today. Draining exit site. We have treating this with iodoform packing however I really have no expectation of healing and to be truthful the amount of drainage that is coming out of this knee I don't think closure of this site is necessarily considered a positive outcome. Her left knee remains warm but not painful there is an effusion she has no systemic symptoms no fever no chills or and she feels well 12/31/16. Still is same amount  of depth today using a skinny at 4.6 cm. There is less drainage. We have been using iodoform packing and I really have no expectation of healing and to be truthful closing this sinus probably is really not the best thing to consider. The patient has an infected artificial knee joint on the  left. She is not felt to be a candidate for a fourth replacement. In the meantime she does not complain of pain except when the dressing is being changed episodically. She has not been systemically unwell no fever or chills or other issues. 01/14/17; after the patient left on 3/7 her daughter tells me the next day the wound had closed over and therefore this is not intact since. Over the last 3 days the patient has noted significant pain in the popliteal fossa of the involved left knee. 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 MSSA was the cause of the original identified joint infection treated at [facility]. 01/21/17; I saw this patient last week at which time there was intense erythema in the popliteal fossa, tenderness and a ballotable fluid collection. I did an IandD on this. Surprisingly the culture of this was negative although as a result of the IandD her tenderness and erythema in the area is resolved. We continue with the iodoform packing READMISSION 04/07/17; this is a patient I know from 2 prior admissions to this clinic starting in January of this year. Her history is essentially as noted above. 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 ([id number]) suppressive antibiotics. She has a draining sinus into the popliteal fossa of the left knee. This remains open and drains. She has been using iodoform packing to this area with gauze over the top. To be truthful I haven't really been certain whether having this area close would be helpful or harmful to this patient. Previously when it's closed she develops a lot more pain in the back of the popliteal fossa and more pain in the knee. She is here today because the packing of the area has become increasingly painful in fact the patient describes this as a "torture chamber" experience. They are packing  this wound twice a day. The patient follows with orthopedic surgery and infectious disease at [facility] although she hasn't seen them since she was last here in March. She denies any systemic symptoms including fever chills etc. She states except for the changing of the dressing her pain when she is mobilizing on the knee is 2/10. She also has a area on the left anterior patella in the incision that is excoriated open. The patient states this is itchy and she does a lot of scratching. They state this was here last time she was here but I have no recollection or documentation of this 04/21/17; patient was readmitted to clinic 2 weeks ago. She has a draining sinus in the left popliteal fossa and a chronically infected left total knee replacement. She also had an excoriation on the left anterior knee. She arrives in clinic today with all of this looking considerably better. The divot in the popliteal fossa is actually closed. READMISSION 07/15/17; the patient arrived today with a real opening of the draining sinus in her left popliteal fossa. This opened about a week ago after developing a painful "knot". Is been draining clear fluid. The patient's pain is a lot better. She is not been systemically  unwell. oShe also is been dealing with an area on the left anterior leg just below the tibial plateau. She also had this last time. Her daughter states that she scratches this area continuously. I have reviewed the last infectious disease note from August. 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 Much to my chagrin they seem reluctant to tap the joint. A culture of purulent material that came out of the popliteal fossa earlier this year cultured coag-negative staph aureus [not an uncommon cause of prosthetic joint infection]. However they really ignored this and they state that the source was questionable. If they believe this I  am wondering why they're so reluctant to tap the fluid. She has not seen orthopedics 07/22/17; patient's sinus tract in the popliteal fossa about 2.5 cm. The area on the anterior part of her knee looks a lot better. We have been using iodoform packing to the popliteal fossa, collagen to the anterior part of her knee. She is not experiencing undue pain or systemic symptoms 08/05/17; patient has a sinus tract in the popliteal fossa on the left. 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 She does not have any options for further replacements she is only been offered an above-knee amputation. 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 She has not been systemically unwell no fever. There is some pain and swelling in the knee but all in all this is not unbearable. They've been using iodoform packing 09/02/17; continued sinus tract on the popliteal fossa on the left as part of a chronic infection on a left total knee replacement. She follows with infectious disease at [facility] although the patient and her daughter are not exactly sure when her next appointment is. She has not been systemically unwell specifically denies fever chills. She has some pain in the left knee however she is reluctant to take even simple analgesics because of the risk of" addiction" 09/30/17; continued sinus tract in the popliteal fossa on the left as part of a chronic infection on the left total knee replacement. She has not been following recently with infectious disease at [facility] She has some pain in the left knee although this is not major and not changed over the last recent months. Depth of the probing area is 1.9 cm READMISSION 03/10/18; this is a patient we have seen in this clinic several times before predominantly related to a draining sinus tract in the left popliteal fossa as part of a chronic infection in the left total knee replacement. She sought infectious disease  in February. A discontinue doxycycline she was on at the time and continued her on long-standing prophylactic cephalexin which she is still on. 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 I see she was seen by pulmonary in April with chronic respiratory failure with hypoxia and cor pulmonale and pulmonary hypertension. She comes clinic this time with the sudden onset of 2 small wounds on the right anterior patella and the right anterior lower leg just below the tibial tuberosity. It is not really clear how this happened although presumably minor trauma that the patient didn't recognize. Her daughter states that she simply noticed blood on the sheets. She may have picked at her skin as well. I believe we've had superficial wounds on the patella previously in this clinic didn't seem to heal over fairly easily. The  patient has not been systemically unwell no fever no chills she does not have a lot of pain in the knee just occasionally when she is up on it 03/17/18; this is a patient we have seen in this clinic previously. She has a chronically infected left total knee replacement that REVECCA, NACHTIGAL. ([id number]) is not felt by [facility] to be a candidate for joint replacement. Previous organism has been MSSA. She has a draining sinus in the left popliteal fossa. I think the area here is been stable. She has superficial areas over the left patella and just below the tibial tuberosity. We've using silver collagen. The area over the patellas just about closed and these area under the tibial tuberosity is smaller this week. 03/31/18; patient with a chronically infected left total knee replacement that is not felt by [facility] to be candidate for joint replacement. She has a draining area in the popliteal fossa which is chronically allowed to drain. She has not tolerated when this closes oShe has a superficial area on the left patella and one just under the left tibial  tuberosity. 04/14/18; this is a patient with a chronically infected left total knee replacement that is not felt to be a candidate for further joint replacement surgery. She has a draining sinus in the popliteal fossa which drain sign no real fluid. Previously when this area has closed over she develops pain and swelling in the popliteal fossa similar to an abscess and it has to be IandD. oShe has a area over the anterior patella which is about 50% epithelialized and a small area below the left ischial tuberosity that is actually closed this week we've been using silver alginate Electronic Signature(s) Signed: 04/14/2018 4:07:49 PM By: Linton Ham MD Entered By: Linton Ham on 04/14/2018 13:08:50 [name] ([id number]) -------------------------------------------------------------------------------- Physical Exam Details Patient Name: [name]. Date of Service: 04/14/2018 11:15 AM Medical Record Number: [id number] Patient Account Number: [id number] Date of Birth/Sex: 1934/08/05 (84 y.o. F) Treating RN: Primary Care Provider: Loura Pardon Other Clinician: Referring Provider: Loura Pardon Treating Provider/Extender: Tito Dine in Treatment: 5 Notes wound exam; the original draining area remains intact leaking sinoatrial fluid oSuperficial area on the left patella is mostly epithelialized perhaps about 75-80% oThe area below this is closed Electronic Signature(s) Signed: 04/14/2018 4:07:49 PM By: Linton Ham MD Entered By: Linton Ham on 04/14/2018 13:09:27 [name] ([id number]) -------------------------------------------------------------------------------- Physician Orders Details Patient Name: [name]. Date of Service: 04/14/2018 11:15 AM Medical Record Number: [id number] Patient Account Number: [id number] Date of Birth/Sex: 05-28-1934 (84 y.o. F) Treating RN: Cornell Barman Primary Care Provider: Loura Pardon Other Clinician: Referring  Provider: Loura Pardon Treating Provider/Extender: Tito Dine in Treatment: 5 Verbal / Phone Orders: No Diagnosis Coding Wound Cleansing Wound #6 Left Knee o Cleanse wound with mild soap and water Wound #8 Left,Posterior Knee o Cleanse wound with mild soap and water Anesthetic (add to Medication List) Wound #6 Left Knee o Topical Lidocaine 4% cream applied to wound bed prior to debridement (In Clinic Only). Wound #8 Left,Posterior Knee o Topical Lidocaine 4% cream applied to wound bed prior to debridement (In Clinic Only). Primary Wound Dressing Wound #6 Left Knee o Silver Alginate Wound #8 Left,Posterior Knee o Silver Alginate Secondary Dressing Wound #6 Left Knee o Logan #8 Thornton Dressing Change Frequency Wound #6 Left Knee o Change dressing every other day. Wound #8 Left,Posterior Knee o Change dressing every other day.  Follow-up Appointments Wound #6 Left Knee o Return Appointment in 3 weeks. Wound #8 Left,Posterior Knee o Return Appointment in 3 weeks. Edema Control AALIYAH, GAVEL ([id number]) Wound #6 Left Knee o Elevate legs to the level of the heart and pump ankles as often as possible Wound #8 Left,Posterior Knee o Elevate legs to the level of the heart and pump ankles as often as possible Electronic Signature(s) Signed: 04/14/2018 4:07:49 PM By: Linton Ham MD Signed: 04/14/2018 4:19:21 PM By: Gretta Cool, BSN, RN, CWS, Kim RN, BSN Entered By: Gretta Cool, BSN, RN, CWS, Kim on 04/14/2018 11:47:56 Moser, Blima Singer ([id number]) -------------------------------------------------------------------------------- Problem List Details Patient Name: SHAYLEY, MEDLIN. Date of Service: 04/14/2018 11:15 AM Medical Record Number: [id number] Patient Account Number: [id number] Date of Birth/Sex: 12/20/1933 (84 y.o. 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 Date of Service: 04/14/2018 11:15 AM Medical Record Number: [id number] Patient Account Number: [id number] Date of Birth/Sex: 08-25-1934 (84 y.o. F) Treating RN: Primary Care Provider: Loura Pardon Other Clinician: Referring Provider: Loura Pardon Treating Provider/Extender: Tito Dine in Treatment: 5 Subjective History of Present Illness (HPI) 10/28/16; this is an 84 year old woman who arrives with a complicated medical issue. 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 Initially in 2000 and apparently most recently in 2014. 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 Indeed she was hospitalized on 02/13/16 and discharged on 02/19/16. She was taken to the OR and it was found that the popliteal abscess was communicating with her joint space. Signed to feel fluid grew MSSA. Patient was treated with IandD of the abscess. She completed IV cefazolin on 03/28/16 and since then has been on Keflex and rifampin for chronic suppression. Her infectious disease doctor is Dr.Shrestha at [facility]. The patient was seen in our [place] sister clinic through much of October and November 2017. At that point she had a draining sinus that eventually closed over. She noted some pain  behind her knee surrounding Christmas day and was seen in our emergency room on 10/23/16. During this ER visit she underwent tendon incision and drainage of a left Baker's cyst and clear colored synovial fluid was obtained. Doxycycline was added for a week to her Keflex and rifampin. A CT scan was ordered but I don't think was ever done due to artifacts from hardware. Her wound culture was negative. She has been using iodoform packing. She states her knee is painful when she stands on it for a period of time. She is not systemically unwell. She has a history of scleroderma or without is been under control for some period of time. 11/04/16 the culture that I did of this area last week showed coag-negative staph and a few enterococcus faecium. Enterococcus is ampicillin resistant. The coag negative staph is only sensitive to vancomycin and tetracycline. Previous joint infection was methicillin sensitive staph aureus. It seems I misunderstood what she said and that her infectious disease clinic appointment is this Friday  and the orthopedic appointment was last Friday. I faxed the culture results to the infectious disease clinic but I've given him a copy of it today. Per the patient and her family they orthopedic surgeon did not want to do anything further to this joint. The patient is not systemically unwell in particular no fever or chills and the pain she has is really minimal 11/18/16; this is a patient who has an infected left knee prosthetic joint. She went to see infectious disease at [facility] who apparently did did not recommend any further antibiotic therapy. She continues to feel the same episodic pain but no systemic symptoms of particular no fever. The depth of the wound in the popliteal fossa of her left knee is 3.5 versus 5 cm last week 11/25/16 4.5 cm of depth today. Draining exit site. We have treating this with iodoform packing however I really have no expectation of healing and to be  truthful the amount of drainage that is coming out of this knee I don't think closure of this site is necessarily considered a positive outcome. Her left knee remains warm but not painful there is an effusion she has no systemic symptoms no fever no chills or and she feels well 12/31/16. Still is same amount of depth today using a skinny at 4.6 cm. There is less drainage. We have been using iodoform packing and I really have no expectation of healing and to be truthful closing this sinus probably is really not the best thing to consider. 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 She is not felt to be a candidate for a fourth replacement. In the meantime she does not complain of pain except when the dressing is being changed episodically. She has not been systemically unwell no fever or chills or other issues. 01/14/17; after the patient left on 3/7 her daughter tells me the next day the wound had closed over and therefore this is not intact since. Over the last 3 days the patient has noted significant pain in the popliteal fossa of the involved left knee. 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 MSSA was the cause of the original identified joint infection treated at [facility]. 01/21/17; I saw this patient last week at which time there was intense erythema in the popliteal fossa, tenderness and a ballotable fluid collection. I did an IandD on this. Surprisingly the culture of this was negative although as a result of the IandD her tenderness and erythema in the area is resolved. We continue with the iodoform packing READMISSION 04/07/17; this is a patient I know from 2 prior admissions to this clinic starting in January of this year. Her history is essentially as noted above. 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 She has a  draining sinus into the popliteal fossa of the left knee. This remains open and drains. She has been using iodoform packing to this area with gauze over the top. To be truthful I haven't really been certain whether CHANTAE, SOO. ([id number]) having this area close would be helpful or harmful to this patient. Previously when it's closed she develops a lot more pain in the back of the popliteal fossa and more pain in the knee. She is here today because the packing of the area has become increasingly painful in fact the patient describes this as a "torture chamber"  experience. They are packing this wound twice a day. The patient follows with orthopedic surgery and infectious disease at [facility] although she hasn't seen them since she was last here in March. She denies any systemic symptoms including fever chills etc. She states except for the changing of the dressing her pain when she is mobilizing on the knee is 2/10. She also has a area on the left anterior patella in the incision that is excoriated open. The patient states this is itchy and she does a lot of scratching. They state this was here last time she was here but I have no recollection or documentation of this 04/21/17; patient was readmitted to clinic 2 weeks ago. She has a draining sinus in the left popliteal fossa and a chronically infected left total knee replacement. She also had an excoriation on the left anterior knee. She arrives in clinic today with all of this looking considerably better. The divot in the popliteal fossa is actually closed. READMISSION 07/15/17; the patient arrived today with a real opening of the draining sinus in her left popliteal fossa. This opened about a week ago after developing a painful "knot". Is been draining clear fluid. The patient's pain is a lot better. She is not been systemically unwell. She also is been dealing with an area on the left anterior leg just below the tibial plateau. She also had this  last time. Her daughter states that she scratches this area continuously. I have reviewed the last infectious disease note from August. 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 Much to my chagrin they seem reluctant to tap the joint. A culture of purulent material that came out of the popliteal fossa earlier this year cultured coag-negative staph aureus [not an uncommon cause of prosthetic joint infection]. However they really ignored this and they state that the source was questionable. If they believe this I am wondering why they're so reluctant to tap the fluid. She has not seen orthopedics 07/22/17; patient's sinus tract in the popliteal fossa about 2.5 cm. The area on the anterior part of her knee looks a lot better. We have been using iodoform packing to the popliteal fossa, collagen to the anterior part of her knee. She is not experiencing undue pain or systemic symptoms 08/05/17; patient has a sinus tract in the popliteal fossa on the left. 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 She does not have any options for further replacements she is only been offered an above-knee amputation. 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 She has not been systemically unwell no fever. There is some pain and swelling in the knee but all in all this is not unbearable. They've been using iodoform packing 09/02/17; continued sinus tract on the popliteal fossa on the left as part of a chronic infection on a left total knee replacement. She follows with infectious disease at [facility] although the patient and her daughter are not exactly sure when her next appointment is. She has not been systemically unwell specifically denies fever chills. She has some pain in the left knee however she is reluctant to take even simple analgesics because of the risk of" addiction" 09/30/17;  continued sinus tract in the popliteal fossa on the left as part of a chronic infection on the left total knee replacement. She has not been following recently with infectious disease at [facility]. She  has some pain in the left knee although this is not major and not changed over the last recent months. Depth of the probing area is 1.9 cm READMISSION 03/10/18; this is a patient we have seen in this clinic several times before predominantly related to a draining sinus tract in the left popliteal fossa as part of a chronic infection in the left total knee replacement. She sought infectious disease in February. A discontinue doxycycline she was on at the time and continued her on long-standing prophylactic cephalexin which she is still on. 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 I see she was seen by pulmonary in April with chronic respiratory failure with hypoxia and cor pulmonale and pulmonary hypertension. She comes clinic this time with the sudden onset of 2 small wounds on the right anterior patella and the right anterior lower leg just below the tibial tuberosity. It is not really clear how this happened although presumably minor trauma that the patient didn't recognize. Her daughter states that she simply noticed blood on the sheets. She may have picked at her skin as well. I believe we've had superficial wounds on the patella previously in this clinic didn't seem to heal over fairly easily. The patient has not been systemically unwell no fever no chills she does not have a lot of pain in the knee just occasionally when she is up on it 03/17/18; this is a patient we have seen in this clinic previously. She has a chronically infected left total knee replacement that is not felt by [facility] to be a candidate for joint replacement. Previous organism has been MSSA. She has a draining sinus in the left popliteal fossa. I think the area here is been stable. LORALYN, RACHEL.  ([id number]) She has superficial areas over the left patella and just below the tibial tuberosity. We've using silver collagen. The area over the patellas just about closed and these area under the tibial tuberosity is smaller this week. 03/31/18; patient with a chronically infected left total knee replacement that is not felt by [facility] to be candidate for joint replacement. She has a draining area in the popliteal fossa which is chronically allowed to drain. She has not tolerated when this closes She has a superficial area on the left patella and one just under the left tibial tuberosity. 04/14/18; this is a patient with a chronically infected left total knee replacement that is not felt to be a candidate for further joint replacement surgery. She has a draining sinus in the popliteal fossa which drain sign no real fluid. Previously when this area has closed over she develops pain and swelling in the popliteal fossa similar to an abscess and it has to be IandD. She has a area over the anterior patella which is about 50% epithelialized and a small area below the left ischial tuberosity that is actually closed this week we've been using silver alginate Objective Constitutional Vitals Time Taken: 11:28 AM, Height: 66 in, Temperature: 97.8 F, Pulse: 72 bpm, Respiratory Rate: 16 breaths/min, Blood Pressure: 140/85 mmHg. Integumentary (Hair, Skin) Wound #6 status is Open. Original cause of wound was Gradually Appeared. The wound is located on the Left Knee. The wound measures 2.2cm length x 1.2cm width x 0.1cm depth; 2.073cm^2 area and 0.207cm^3 volume. There is no tunneling noted. There is a large amount of sanguinous drainage noted. The wound margin is distinct with the outline attached to the wound base. There is medium (34-66%) red granulation  within the wound bed. There is a medium (34-66%) amount of necrotic tissue within the wound bed including Eschar and Adherent Slough. The periwound skin  appearance exhibited: Scarring. The periwound skin appearance did not exhibit: Callus, Crepitus, Excoriation, Induration, Rash, Dry/Scaly, Maceration, Atrophie Blanche, Cyanosis, Ecchymosis, Hemosiderin Staining, Mottled, Pallor, Rubor, Erythema. Periwound temperature was noted as No Abnormality. The periwound has tenderness on palpation. Wound #7 status is Healed - Epithelialized. Original cause of wound was Gradually Appeared. The wound is located on the Left,Medial Lower Leg. The wound measures 0cm length x 0cm width x 0cm depth; 0cm^2 area and 0cm^3 volume. Wound #8 status is Open. Original cause of wound was Surgical Injury. The wound is located on the Left,Posterior Knee. The wound measures 0.2cm length x 0.2cm width x 0.2cm depth; 0.031cm^2 area and 0.006cm^3 volume. There is no tunneling or undermining noted. There is a large amount of serous drainage noted. The wound margin is distinct with the outline attached to the wound base. There is large (67-100%) red granulation within the wound bed. There is no necrotic tissue within the wound bed. The periwound skin appearance did not exhibit: Callus, Crepitus, Excoriation, Induration, Rash, Scarring, Dry/Scaly, Maceration, Atrophie Blanche, Cyanosis, Ecchymosis, Hemosiderin Staining, Mottled, Pallor, Rubor, Erythema. Periwound temperature was noted as No Abnormality. The periwound has tenderness on palpation. 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 Wound #8 Left,Posterior Knee: Topical Lidocaine 4% cream  applied to wound bed prior to debridement (In Clinic Only). Primary Wound Dressing: Wound #6 Left Knee: Silver Alginate Wound #8 Left,Posterior Knee: Silver Alginate Secondary Dressing: Wound #6 Left Knee: [place] Wound #8 Left,Posterior Knee: [place] Dressing Change Frequency: Wound #6 Left Knee: Change dressing every other day. Wound #8 Left,Posterior Knee: Change dressing every other day. Follow-up Appointments: Wound #6 Left Knee: Return Appointment in 3 weeks. Wound #8 Left,Posterior Knee: Return Appointment in 3 weeks. Edema Control: Wound #6 Left Knee: Elevate legs to the level of the heart and pump ankles as often as possible Wound #8 Left,Posterior Knee: Elevate legs to the level of the heart and pump ankles as often as possible #1I'm going to continue with silver alginate to the left superficial patella. We cover this with [place] #2the popliteal fossa sinus should be allowed to continue to drain #3 the patient has a chronically swollen warm knee joint with a small effusion. It is unchanged today Electronic Signature(s) Signed: 04/14/2018 4:07:49 PM By: Linton Ham MD [name] ([id number]) Entered By: Linton Ham on 04/14/2018 13:11:43 [name] ([id number]) -------------------------------------------------------------------------------- SuperBill Details Patient Name: TOWANNA, AVERY. Date of Service: 04/14/2018 Medical Record Number: [id number] Patient Account Number: [id number] Date of Birth/Sex: 1934/04/26 (84 y.o. 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

## 2018-04-17 NOTE — Progress Notes (Signed)
[name], [name] ([id number]) Visit Report for 04/14/2018 Arrival Information Details [name], [name]. Date of Service: 04/14/2018 11:15 AM Medical Record Number: [id number] Patient Account Number: [id number] Date of Birth/Sex: 1934-04-28 (84 y.o. F) Treating RN: Ahmed Prima Primary Care Thinh Cuccaro: Loura Pardon Other Clinician: Referring Deone Omahoney: Loura Pardon Treating [place] Noyce/Extender: Tito Dine in Treatment: 5 Visit Information History Since Last Visit All ordered tests and consults were completed: No Patient Arrived: Gilford Rile Added or deleted any medications: No Arrival Time: 11:26 Any new allergies or adverse reactions: No Accompanied By: son Had a fall or experienced change in No Transfer Assistance: EasyPivot Patient activities of daily living that may affect Lift risk of falls: Patient Identification Verified: Yes Signs or symptoms of abuse/neglect since last visito No Secondary Verification Process Yes Hospitalized since last visit: No Completed: Implantable device outside of the clinic excluding No Patient Requires Transmission-Based No cellular tissue based products placed in the center Precautions: since last visit: Patient Has Alerts: No Has Dressing in Place as Prescribed: Yes Pain Present Now: No Electronic Signature(s) Signed: 04/15/2018 4:29:22 PM By: Alric Quan Entered By: Alric Quan on 04/14/2018 11:27:25 [name] ([id number]) -------------------------------------------------------------------------------- Clinic Level of Care Assessment Details [name]. Date of Service: 04/14/2018 11:15 AM Medical Record Number: [id number] Patient Account Number: [id number] Date of Birth/Sex: [birth year] (84 y.o. F) Treating RN: Cornell Barman Primary Care Hawa Henly: Loura Pardon Other Clinician: Referring Emanuella Nickle: Loura Pardon Treating Doral Ventrella/Extender: Tito Dine in Treatment: 5 Clinic Level of  Care Assessment Items TOOL 4 Quantity Score []  - Use when only an EandM is performed on FOLLOW-UP visit 0 ASSESSMENTS - Nursing Assessment / Reassessment []  - Reassessment of Co-morbidities (includes updates in patient status) 0 X- 1 5 Reassessment of Adherence to Treatment Plan ASSESSMENTS - Wound and Skin Assessment / Reassessment X - Simple Wound Assessment / Reassessment - one wound 1 5 []  - 0 Complex Wound Assessment / Reassessment - multiple wounds []  - 0 Dermatologic / Skin Assessment (not related to wound area) ASSESSMENTS - Focused Assessment []  - Circumferential Edema Measurements - multi extremities 0 []  - 0 Nutritional Assessment / Counseling / Intervention []  - 0 Lower Extremity Assessment (monofilament, tuning fork, pulses) []  - 0 Peripheral Arterial Disease Assessment (using hand held doppler) ASSESSMENTS - Ostomy and/or Continence Assessment and Care []  - Incontinence Assessment and Management 0 []  - 0 Ostomy Care Assessment and Management (repouching, etc.) PROCESS - Coordination of Care X - Simple Patient / Family Education for ongoing care 1 15 []  - 0 Complex (extensive) Patient / Family Education for ongoing care X- 1 10 Staff obtains Programmer, systems, Records, Test Results / Process Orders []  - 0 Staff telephones HHA, Nursing Homes / Clarify orders / etc []  - 0 Routine Transfer to another Facility (non-emergent condition) []  - 0 Routine Hospital Admission (non-emergent condition) []  - 0 New Admissions / Biomedical engineer / Ordering NPWT, Apligraf, etc. []  - 0 Emergency Hospital Admission (emergent condition) X- 1 10 Simple Discharge Coordination [name], [name]. ([id number]) []  - 0 Complex (extensive) Discharge Coordination PROCESS - Special Needs []  - Pediatric / Minor Patient Management 0 []  - 0 Isolation Patient Management []  - 0 Hearing / Language / Visual special needs []  - 0 Assessment of Community assistance (transportation, D/C planning,  etc.) []  - 0 Additional assistance / Altered mentation []  - 0 Support Surface(s) Assessment (bed, cushion, seat, etc.) INTERVENTIONS - Wound Cleansing / Measurement X - Simple Wound Cleansing -  one wound 1 5 []  - 0 Complex Wound Cleansing - multiple wounds X- 1 5 Wound Imaging (photographs - any number of wounds) []  - 0 Wound Tracing (instead of photographs) X- 1 5 Simple Wound Measurement - one wound []  - 0 Complex Wound Measurement - multiple wounds INTERVENTIONS - Wound Dressings []  - Small Wound Dressing one or multiple wounds 0 X- 1 15 Medium Wound Dressing one or multiple wounds []  - 0 Large Wound Dressing one or multiple wounds []  - 0 Application of Medications - topical []  - 0 Application of Medications - injection INTERVENTIONS - Miscellaneous []  - External ear exam 0 []  - 0 Specimen Collection (cultures, biopsies, blood, body fluids, etc.) []  - 0 Specimen(s) / Culture(s) sent or taken to Lab for analysis []  - 0 Patient Transfer (multiple staff / Civil Service fast streamer / Similar devices) []  - 0 Simple Staple / Suture removal (25 or less) []  - 0 Complex Staple / Suture removal (26 or more) []  - 0 Hypo / Hyperglycemic Management (close monitor of Blood Glucose) []  - 0 Ankle / Brachial Index (ABI) - do not check if billed separately X- 1 5 Vital Signs [name], [name] ([id number]) Has the patient been seen at the hospital within the last three years: Yes Total Score: 80 Level Of Care: New/Established - Level 3 Electronic Signature(s) Signed: 04/14/2018 4:19:21 PM By: Gretta Cool, BSN, RN, CWS, Kim RN, BSN Entered By: Gretta Cool, BSN, RN, CWS, Kim on 04/14/2018 11:48:47 [name] ([id number]) -------------------------------------------------------------------------------- Encounter Discharge Information Details [name]. Date of Service: 04/14/2018 11:15 AM Medical Record Number: [id number] Patient Account Number: [id number] Date of Birth/Sex: 09/05/1934 (84  y.o. F) Treating RN: Roger Shelter Primary Care Miracle Mongillo: Loura Pardon Other Clinician: Referring Avrielle Fry: Loura Pardon Treating Buddie Marston/Extender: Tito Dine in Treatment: 5 Encounter Discharge Information Items Discharge Condition: Stable Ambulatory Status: Walker Discharge Destination: Home Transportation: Private Auto Accompanied By: son Schedule Follow-up Appointment: Yes Clinical Summary of Care: Electronic Signature(s) Signed: 04/14/2018 4:10:19 PM By: Roger Shelter Entered By: Roger Shelter on 04/14/2018 11:58:47 [name], [name] ([id number]) -------------------------------------------------------------------------------- Lower Extremity Assessment Details [name]. Date of Service: 04/14/2018 11:15 AM Medical Record Number: [id number] Patient Account Number: [id number] Date of Birth/Sex: 08-23-1934 (84 y.o. F) Treating RN: Ahmed Prima Primary Care Azjah Pardo: Loura Pardon Other Clinician: Referring Berneice Zettlemoyer: Loura Pardon Treating Donnica Jarnagin/Extender: Ricard Dillon Weeks in Treatment: 5 Electronic Signature(s) Signed: 04/15/2018 4:29:22 PM By: Alric Quan Entered By: Alric Quan on 04/14/2018 11:37:38 [name], [name] ([id number]) -------------------------------------------------------------------------------- Multi Wound Chart Details [name]. Date of Service: 04/14/2018 11:15 AM Medical Record Number: [id number] Patient Account Number: [id number] Date of Birth/Sex: 1934/05/09 (84 y.o. F) Treating RN: Cornell Barman Primary Care Freeda Spivey: Loura Pardon Other Clinician: Referring Reiner Loewen: Loura Pardon Treating Mikita Lesmeister/Extender: Tito Dine in Treatment: 5 Vital Signs Height(in): 60 Pulse(bpm): 72 Weight(lbs): Blood Pressure(mmHg): 140/85 Body Mass Index(BMI): Temperature(F): 97.8 Respiratory Rate 16 (breaths/min): Photos: [6:No Photos] [7:No Photos] [8:No Photos] Wound Location:  [6:Left Knee] [7:Left, Medial Lower Leg] [8:Left Knee - Posterior] Wounding Event: [6:Gradually Appeared] [7:Gradually Appeared] [8:Surgical Injury] Primary Etiology: [6:Lesion] [7:Venous Leg Ulcer] [8:Infection - not elsewhere classified] Comorbid History: [6:Cataracts, Sleep Apnea, Arrhythmia, Hypertension, Scleroderma] [7:N/A] [8:Cataracts, Sleep Apnea, Arrhythmia, Hypertension, Scleroderma] Date Acquired: [6:02/24/2018] [7:02/24/2018] [8:02/24/2018] Weeks of Treatment: [6:5] [7:5] [8:5] Wound Status: [6:Open] [7:Healed - Epithelialized] [8:Open] Measurements L x W x D [6:2.2x1.2x0.1] [7:0x0x0] [8:0.2x0.2x0.2] (cm) Area (cm) : [6:2.073] [7:0] [8:0.031] Volume (cm) : [6:0.207] [  7:0] [8:0.006] % Reduction in Area: [6:-174.90%] [7:100.00%] [8:56.30%] % Reduction in Volume: [6:-176.00%] [7:100.00%] [8:57.10%] Classification: [6:Partial Thickness] [7:Full Thickness Without Exposed Support Structures] [8:Partial Thickness] Exudate Amount: [6:Large] [7:N/A] [8:Large] Exudate Type: [6:Sanguinous] [7:N/A] [8:Serous] Exudate Color: [6:red] [7:N/A] [8:amber] Wound Margin: [6:Distinct, outline attached] [7:N/A] [8:Distinct, outline attached] Granulation Amount: [6:Medium (34-66%)] [7:N/A] [8:Large (67-100%)] Granulation Quality: [6:Red] [7:N/A] [8:Red] Necrotic Amount: [6:Medium (34-66%)] [7:N/A] [8:None Present (0%)] Necrotic Tissue: [6:Eschar, Adherent Slough] [7:N/A] [8:N/A] Exposed Structures: [6:Fascia: No Fat Layer (Subcutaneous Tissue) Exposed: No Tendon: No Muscle: No Joint: No Bone: No] [7:N/A] [8:Fascia: No Fat Layer (Subcutaneous Tissue) Exposed: No Tendon: No Muscle: No Joint: No Bone: No] Epithelialization: [6:Medium (34-66%)] [7:N/A] [8:None] Periwound Skin Texture: [6:Scarring: Yes Excoriation: No] [7:No Abnormalities Noted] [8:Excoriation: No Induration: No] Induration: No Callus: No Callus: No Crepitus: No Crepitus: No Rash: No Rash: No Scarring: No Periwound Skin  Moisture: Maceration: No No Abnormalities Noted Maceration: No Dry/Scaly: No Dry/Scaly: No Periwound Skin Color: Atrophie Blanche: No No Abnormalities Noted Atrophie Blanche: No Cyanosis: No Cyanosis: No Ecchymosis: No Ecchymosis: No Erythema: No Erythema: No Hemosiderin Staining: No Hemosiderin Staining: No Mottled: No Mottled: No Pallor: No Pallor: No Rubor: No Rubor: No Temperature: No Abnormality N/A No Abnormality Tenderness on Palpation: Yes No Yes Wound Preparation: Ulcer Cleansing: N/A Ulcer Cleansing: Rinsed/Irrigated with Saline Rinsed/Irrigated with Saline Topical Anesthetic Applied: Topical Anesthetic Applied: None, Other: lidocaine 4% Other: lidocaine 4% Treatment Notes Wound #6 (Left Knee) 1. Cleansed with: Clean wound with Normal Saline 2. Anesthetic Topical Lidocaine 4% cream to wound bed prior to debridement 4. Dressing Applied: Other dressing (specify in notes) 5. Secondary [place] Notes silvercel Wound #8 (Left, Posterior Knee) 1. Cleansed with: Clean wound with Normal Saline 2. Anesthetic Topical Lidocaine 4% cream to wound bed prior to debridement 4. Dressing Applied: Other dressing (specify in notes) Notes silvercel, drawtex, telfa Associate Professor) Signed: 04/14/2018 4:07:49 PM By: Linton Ham MD Entered By: Linton Ham on 04/14/2018 13:06:00 [name] ([id number]) -------------------------------------------------------------------------------- [place] Details [name], [name]. Date of Service: 04/14/2018 11:15 AM Medical Record Number: [id number] Patient Account Number: [id number] Date of Birth/Sex: 12-Aug-1934 (84 y.o. F) Treating RN: Cornell Barman Primary Care Jayvian Escoe: Loura Pardon Other Clinician: Referring Siniyah Evangelist: Loura Pardon Treating Martel Galvan/Extender: Tito Dine in Treatment: 5 Active Inactive ` Abuse / Safety / Falls / Self  Care Management Nursing Diagnoses: History of Falls Goals: Patient will not experience any injury related to falls Date Initiated: 03/10/2018 Target Resolution Date: 04/09/2018 Goal Status: Active Patient will remain injury free related to falls Date Initiated: 03/10/2018 Target Resolution Date: 04/09/2018 Goal Status: Active Interventions: Assess fall risk on admission and as needed Notes: ` Orientation to the Wound Care Program Nursing Diagnoses: Knowledge deficit related to the wound healing center program Goals: Patient/caregiver will verbalize understanding of the [place] Date Initiated: 03/10/2018 Target Resolution Date: 03/22/2018 Goal Status: Active Interventions: Provide education on orientation to the wound center Notes: ` Venous Leg Ulcer Nursing Diagnoses: Knowledge deficit related to disease process and management Goals: Patient will maintain optimal edema control Date Initiated: 03/10/2018 Target Resolution Date: 03/26/2018 [name], [name]. ([id number]) Goal Status: Active Interventions: Assess peripheral edema status every visit. Notes: ` Wound/Skin Impairment Nursing Diagnoses: Impaired tissue integrity Goals: Ulcer/skin breakdown will have a volume reduction of 50% by week 8 Date Initiated: 03/10/2018 Target Resolution Date: 05/10/2018 Goal Status: Active Interventions: Assess ulceration(s) every visit Treatment Activities: Skin care regimen initiated :  03/10/2018 Topical wound management initiated : 03/10/2018 Notes: Electronic Signature(s) Signed: 04/14/2018 4:19:21 PM By: Gretta Cool, BSN, RN, CWS, Kim RN, BSN Entered By: Gretta Cool, BSN, RN, CWS, Kim on 04/14/2018 11:45:27 [name] ([id number]) -------------------------------------------------------------------------------- Pain Assessment Details [name], [name]. Date of Service: 04/14/2018 11:15 AM Medical Record Number: [id number] Patient Account Number:  [id number] Date of Birth/Sex: 01-03-1934 (84 y.o. F) Treating RN: Ahmed Prima Primary Care Christee Mervine: Loura Pardon Other Clinician: Referring Micaiah Remillard: Loura Pardon Treating Xzaria Teo/Extender: Tito Dine in Treatment: 5 Active Problems Location of Pain Severity and Description of Pain Patient Has Paino No Site Locations Pain Management and Medication Current Pain Management: Electronic Signature(s) Signed: 04/15/2018 4:29:22 PM By: Alric Quan Entered By: Alric Quan on 04/14/2018 11:27:31 [name] ([id number]) -------------------------------------------------------------------------------- Patient/Caregiver Education Details [name]. Date of Service: 04/14/2018 11:15 AM Medical Record Number: [id number] Patient Account Number: [id number] Date of Birth/Gender: 1934-05-05 (84 y.o. F) Treating RN: Roger Shelter Primary Care Physician: Loura Pardon Other Clinician: Referring Physician: Loura Pardon Treating Physician/Extender: Tito Dine in Treatment: 5 Education Assessment Education Provided To: Patient Education Topics Provided Wound/Skin Impairment: Handouts: Caring for Your Ulcer Methods: Explain/Verbal Responses: State content correctly Electronic Signature(s) Signed: 04/14/2018 4:10:19 PM By: Roger Shelter Entered By: Roger Shelter on 04/14/2018 11:59:01 [name] ([id number]) -------------------------------------------------------------------------------- Wound Assessment Details [name]. Date of Service: 04/14/2018 11:15 AM Medical Record Number: [id number] Patient Account Number: [id number] Date of Birth/Sex: 1934-02-27 (84 y.o. F) Treating RN: Ahmed Prima Primary Care Thania Woodlief: Loura Pardon Other Clinician: Referring Terrel Manalo: Loura Pardon Treating Diamonique Ruedas/Extender: Tito Dine in Treatment: 5 Wound Status Wound Number: 6 Primary  Lesion Etiology: Wound Location: Left Knee Wound Status: Open Wounding Event: Gradually Appeared Comorbid Cataracts, Sleep Apnea, Arrhythmia, Date Acquired: 02/24/2018 History: Hypertension, Scleroderma Weeks Of Treatment: 5 Clustered Wound: No Wound Measurements Length: (cm) 2.2 Width: (cm) 1.2 Depth: (cm) 0.1 Area: (cm) 2.073 Volume: (cm) 0.207 % Reduction in Area: -174.9% % Reduction in Volume: -176% Epithelialization: Medium (34-66%) Tunneling: No Wound Description Classification: Partial Thickness Wound Margin: Distinct, outline attached Exudate Amount: Large Exudate Type: Sanguinous Exudate Color: red Foul Odor After Cleansing: No Slough/Fibrino Yes Wound Bed Granulation Amount: Medium (34-66%) Exposed Structure Granulation Quality: Red Fascia Exposed: No Necrotic Amount: Medium (34-66%) Fat Layer (Subcutaneous Tissue) Exposed: No Necrotic Quality: Eschar, Adherent Slough Tendon Exposed: No Muscle Exposed: No Joint Exposed: No Bone Exposed: No Periwound Skin Texture Texture Color No Abnormalities Noted: No No Abnormalities Noted: No Callus: No Atrophie Blanche: No Crepitus: No Cyanosis: No Excoriation: No Ecchymosis: No Induration: No Erythema: No Rash: No Hemosiderin Staining: No Scarring: Yes Mottled: No Pallor: No Moisture Rubor: No No Abnormalities Noted: No Dry / Scaly: No Temperature / Pain Maceration: No Temperature: No Abnormality Tenderness on Palpation: Yes [name], [name] ([id number]) Wound Preparation Ulcer Cleansing: Rinsed/Irrigated with Saline Topical Anesthetic Applied: None, Other: lidocaine 4%, Treatment Notes Wound #6 (Left Knee) 1. Cleansed with: Clean wound with Normal Saline 2. Anesthetic Topical Lidocaine 4% cream to wound bed prior to debridement 4. Dressing Applied: Other dressing (specify in notes) 5. Secondary [place] Notes silvercel Electronic Signature(s) Signed: 04/15/2018  4:29:22 PM By: Alric Quan Entered By: Alric Quan on 04/14/2018 11:37:02 [name] ([id number]) -------------------------------------------------------------------------------- Wound Assessment Details [name]. Date of Service: 04/14/2018 11:15 AM Medical Record Number: [id number] Patient Account Number: [id number] Date of Birth/Sex: 1934/01/06 (84 y.o. F) Treating RN: Ahmed Prima  Primary Care Anvika Gashi: Loura Pardon Other Clinician: Referring Ardith Test: Loura Pardon Treating Priseis Cratty/Extender: Ricard Dillon Weeks in Treatment: 5 Wound Status Wound Number: 7 Primary Etiology: Venous Leg Ulcer Wound Location: Left, Medial Lower Leg Wound Status: Healed - Epithelialized Wounding Event: Gradually Appeared Date Acquired: 02/24/2018 Weeks Of Treatment: 5 Clustered Wound: No Wound Measurements Length: (cm) 0 Width: (cm) 0 Depth: (cm) 0 Area: (cm) 0 Volume: (cm) 0 % Reduction in Area: 100% % Reduction in Volume: 100% Wound Description Full Thickness Without Exposed Support Classification: Structures Periwound Skin Texture Texture Color No Abnormalities Noted: No No Abnormalities Noted: No Moisture No Abnormalities Noted: No Electronic Signature(s) Signed: 04/15/2018 4:29:22 PM By: Alric Quan Entered By: Alric Quan on 04/14/2018 11:34:43 Fellman, [name] ([id number]) -------------------------------------------------------------------------------- Wound Assessment Details [name]. Date of Service: 04/14/2018 11:15 AM Medical Record Number: [id number] Patient Account Number: [id number] Date of Birth/Sex: 12/14/1933 (84 y.o. F) Treating RN: Ahmed Prima Primary Care Caroll Cunnington: Loura Pardon Other Clinician: Referring Alyscia Carmon: Loura Pardon Treating Rhone Ozaki/Extender: Tito Dine in Treatment: 5 Wound Status Wound Number: 8 Primary Infection - not elsewhere classified Etiology: Wound  Location: Left Knee - Posterior Wound Status: Open Wounding Event: Surgical Injury Comorbid Cataracts, Sleep Apnea, Arrhythmia, Date Acquired: 02/24/2018 History: Hypertension, Scleroderma Weeks Of Treatment: 5 Clustered Wound: No Wound under treatment by Attie Nawabi outside of [place] Wound Measurements Length: (cm) 0.2 Width: (cm) 0.2 Depth: (cm) 0.2 Area: (cm) 0.031 Volume: (cm) 0.006 % Reduction in Area: 56.3% % Reduction in Volume: 57.1% Epithelialization: None Tunneling: No Undermining: No Wound Description Classification: Partial Thickness Wound Margin: Distinct, outline attached Exudate Amount: Large Exudate Type: Serous Exudate Color: amber Foul Odor After Cleansing: No Slough/Fibrino No Wound Bed Granulation Amount: Large (67-100%) Exposed Structure Granulation Quality: Red Fascia Exposed: No Necrotic Amount: None Present (0%) Fat Layer (Subcutaneous Tissue) Exposed: No Tendon Exposed: No Muscle Exposed: No Joint Exposed: No Bone Exposed: No Periwound Skin Texture Texture Color No Abnormalities Noted: No No Abnormalities Noted: No Callus: No Atrophie Blanche: No Crepitus: No Cyanosis: No Excoriation: No Ecchymosis: No Induration: No Erythema: No Rash: No Hemosiderin Staining: No Scarring: No Mottled: No Pallor: No Moisture Rubor: No No Abnormalities Noted: No Dry / Scaly: No Temperature / Pain Maceration: No Temperature: No Abnormality Swartzentruber, Anjelique M. ([id number]) Tenderness on Palpation: Yes Wound Preparation Ulcer Cleansing: Rinsed/Irrigated with Saline Topical Anesthetic Applied: Other: lidocaine 4%, Treatment Notes Wound #8 (Left, Posterior Knee) 1. Cleansed with: Clean wound with Normal Saline 2. Anesthetic Topical Lidocaine 4% cream to wound bed prior to debridement 4. Dressing Applied: Other dressing (specify in notes) Notes silvercel, drawtex, telfa Materials engineer Signature(s) Signed: 04/15/2018 4:29:22 PM By:  Alric Quan Entered By: Alric Quan on 04/14/2018 11:33:15 [name] ([id number]) -------------------------------------------------------------------------------- Vitals Details [name]. Date of Service: 04/14/2018 11:15 AM Medical Record Number: [id number] Patient Account Number: [id number] Date of Birth/Sex: 1934-02-26 (84 y.o. F) Treating RN: Ahmed Prima Primary Care Tyjanae Bartek: Loura Pardon Other Clinician: Referring Roshan Salamon: Loura Pardon Treating Rubena Roseman/Extender: Tito Dine in Treatment: 5 Vital Signs Time Taken: 11:28 Temperature (F): 97.8 Height (in): 66 Pulse (bpm): 72 Respiratory Rate (breaths/min): 16 Blood Pressure (mmHg): 140/85 Reference Range: 80 - 120 mg / dl Electronic Signature(s) Signed: 04/15/2018 4:29:22 PM By: Alric Quan Entered By: Alric Quan on 04/14/2018 11:29:41

## 2018-05-05 ENCOUNTER — Encounter: Payer: Medicare Other | Attending: Internal Medicine | Admitting: Internal Medicine

## 2018-05-05 DIAGNOSIS — M71062 Abscess of bursa, left knee: Secondary | ICD-10-CM | POA: Insufficient documentation

## 2018-05-05 DIAGNOSIS — L97928 Non-pressure chronic ulcer of unspecified part of left lower leg with other specified severity: Secondary | ICD-10-CM | POA: Diagnosis present

## 2018-05-05 DIAGNOSIS — M00862 Arthritis due to other bacteria, left knee: Secondary | ICD-10-CM | POA: Diagnosis not present

## 2018-05-06 NOTE — Progress Notes (Signed)
NEELAH, MANNINGS ([id number]) Visit Report for 05/05/2018 HPI Details [name], [name]. Date of Service: 05/05/2018 10:00 AM Medical Record Number: [id number] Patient Account Number: [id number] Date of Birth/Sex: 1934/10/10 (84 y.o. F) Treating RN: Roger Shelter Primary Care Provider: Loura Pardon Other Clinician: Referring Provider: Loura Pardon Treating Provider/Extender: Tito Dine in Treatment: 8 History of Present Illness HPI Description: 10/28/16; this is an 84 year old woman who arrives with a complicated medical issue. 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 Initially in 2000 and apparently most recently in 2014. 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 Indeed she was hospitalized on 02/13/16 and discharged on 02/19/16. She was taken to the OR and it was found that the popliteal abscess was communicating with her joint space. Signed to feel fluid grew MSSA. Patient was treated with IandD of the abscess. She completed IV cefazolin on 03/28/16 and since then has been on Keflex and rifampin for chronic suppression. Her infectious disease doctor is Dr.Shrestha at [facility]. The patient was seen in our [place] sister clinic through much of October and November 2017. At that point she had a draining sinus that eventually closed over. She noted some pain behind her knee surrounding Christmas day and was seen in our emergency room on 10/23/16. During this ER visit she underwent tendon incision and drainage of a left Baker's cyst and clear colored synovial fluid was obtained. Doxycycline was added for a week to her Keflex and rifampin. A CT scan was ordered but I don't think was ever done due to artifacts from hardware. Her wound culture was negative. She has been using iodoform packing. She states her knee is painful when she stands on it for a period of time. She is not systemically unwell. She has a history of  scleroderma or without is been under control for some period of time. 11/04/16 the culture that I did of this area last week showed coag-negative staph and a few enterococcus faecium. Enterococcus is ampicillin resistant. The coag negative staph is only sensitive to vancomycin and tetracycline. Previous joint infection was methicillin sensitive staph aureus. It seems I misunderstood what she said and that her infectious disease clinic appointment is this Friday and the orthopedic appointment was last Friday. I faxed the culture results to the infectious disease clinic but I've given him a copy of it today. Per the patient and her family they orthopedic surgeon did not want to do anything further to this joint. The patient is not systemically unwell in particular no fever or chills and the pain she has is really minimal 11/18/16; this is a patient who has an infected left knee prosthetic joint. She went to see infectious disease at [facility] who apparently did did not recommend any further antibiotic therapy. She continues to feel the same episodic pain but no systemic symptoms of particular no fever. The depth of the wound in the popliteal fossa of her left knee is 3.5 versus 5 cm last week 11/25/16 4.5 cm of depth today. Draining exit site. We have treating this with iodoform packing however I really have no expectation of healing and to be truthful the amount of drainage that is coming out of this knee I don't think closure of this site is necessarily considered a positive outcome. Her left knee remains warm but not painful there is an effusion she has no systemic symptoms no fever no chills or and she feels well 12/31/16. Still is  same amount of depth today using a skinny at 4.6 cm. There is less drainage. We have been using iodoform packing and I really have no expectation of healing and to be truthful closing this sinus probably is really not the best thing to consider. The patient has an infected  artificial knee joint on the left. She is not felt to be a candidate for a fourth replacement. In the meantime she does not complain of pain except when the dressing is being changed episodically. She has not been systemically unwell no fever or chills or other issues. 01/14/17; after the patient left on 3/7 her daughter tells me the next day the wound had closed over and therefore this is not intact since. Over the last 3 days the patient has noted significant pain in the popliteal fossa of the involved left knee. 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 MSSA was the cause of the original identified joint infection treated at [facility]. 01/21/17; I saw this patient last week at which time there was intense erythema in the popliteal fossa, tenderness and a ballotable fluid collection. I did an IandD on this. Surprisingly the culture of this was negative although as a result of the IandD her tenderness and erythema in the area is resolved. We continue with the iodoform packing READMISSION 04/07/17; this is a patient I know from 2 prior admissions to this clinic starting in January of this year. Her history is essentially as noted above. 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 ([id number]) suppressive antibiotics. She has a draining sinus into the popliteal fossa of the left knee. This remains open and drains. She has been using iodoform packing to this area with gauze over the top. To be truthful I haven't really been certain whether having this area close would be helpful or harmful to this patient. Previously when it's closed she develops a lot more pain in the back of the popliteal fossa and more pain in the knee. She is here today because the packing of the area has become increasingly painful in fact the patient describes this as a "torture chamber"  experience. They are packing this wound twice a day. The patient follows with orthopedic surgery and infectious disease at [facility] although she hasn't seen them since she was last here in March. She denies any systemic symptoms including fever chills etc. She states except for the changing of the dressing her pain when she is mobilizing on the knee is 2/10. She also has a area on the left anterior patella in the incision that is excoriated open. The patient states this is itchy and she does a lot of scratching. They state this was here last time she was here but I have no recollection or documentation of this 04/21/17; patient was readmitted to clinic 2 weeks ago. She has a draining sinus in the left popliteal fossa and a chronically infected left total knee replacement. She also had an excoriation on the left anterior knee. She arrives in clinic today with all of this looking considerably better. The divot in the popliteal fossa is actually closed. READMISSION 07/15/17; the patient arrived today with a real opening of the draining sinus in her left popliteal fossa. This opened about a week ago after developing a painful "knot". Is been draining clear fluid. The patient's pain is a lot better. She is not  been systemically unwell. oShe also is been dealing with an area on the left anterior leg just below the tibial plateau. She also had this last time. Her daughter states that she scratches this area continuously. I have reviewed the last infectious disease note from August. 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 Much to my chagrin they seem reluctant to tap the joint. A culture of purulent material that came out of the popliteal fossa earlier this year cultured coag-negative staph aureus [not an uncommon cause of prosthetic joint infection]. However they really ignored this and they state that the source  was questionable. If they believe this I am wondering why they're so reluctant to tap the fluid. She has not seen orthopedics 07/22/17; patient's sinus tract in the popliteal fossa about 2.5 cm. The area on the anterior part of her knee looks a lot better. We have been using iodoform packing to the popliteal fossa, collagen to the anterior part of her knee. She is not experiencing undue pain or systemic symptoms 08/05/17; patient has a sinus tract in the popliteal fossa on the left. 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 She does not have any options for further replacements she is only been offered an above-knee amputation. 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 She has not been systemically unwell no fever. There is some pain and swelling in the knee but all in all this is not unbearable. They've been using iodoform packing 09/02/17; continued sinus tract on the popliteal fossa on the left as part of a chronic infection on a left total knee replacement. She follows with infectious disease at [facility] although the patient and her daughter are not exactly sure when her next appointment is. She has not been systemically unwell specifically denies fever chills. She has some pain in the left knee however she is reluctant to take even simple analgesics because of the risk of" addiction" 09/30/17; continued sinus tract in the popliteal fossa on the left as part of a chronic infection on the left total knee replacement. She has not been following recently with infectious disease at [facility]. She has some pain in the left knee although this is not major and not changed over the last recent months. Depth of the probing area is 1.9 cm READMISSION 03/10/18; this is a patient we have seen in this clinic several times before predominantly related to a draining sinus tract in the left popliteal fossa as part of a chronic infection in the left total knee  replacement. She sought infectious disease in February. A discontinue doxycycline she was on at the time and continued her on long-standing prophylactic cephalexin which she is still on. 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 I see she was seen by pulmonary in April with chronic respiratory failure with hypoxia and cor pulmonale and pulmonary hypertension. She comes clinic this time with the sudden onset of 2 small wounds on the right anterior patella and the right anterior lower leg just below the tibial tuberosity. It is not really clear how this happened although presumably minor trauma that the patient didn't recognize. Her daughter states that she simply noticed blood on the sheets. She may have picked at her skin as well. I believe we've had superficial wounds on the patella previously in this clinic didn't seem to heal over fairly  easily. The patient has not been systemically unwell no fever no chills she does not have a lot of pain in the knee just occasionally when she is up on it 03/17/18; this is a patient we have seen in this clinic previously. She has a chronically infected left total knee replacement that BRANDEY, VANDALEN. ([id number]) is not felt by [facility] to be a candidate for joint replacement. Previous organism has been MSSA. She has a draining sinus in the left popliteal fossa. I think the area here is been stable. She has superficial areas over the left patella and just below the tibial tuberosity. We've using silver collagen. The area over the patellas just about closed and these area under the tibial tuberosity is smaller this week. 03/31/18; patient with a chronically infected left total knee replacement that is not felt by [facility] to be candidate for joint replacement. She has a draining area in the popliteal fossa which is chronically allowed to drain. She has not tolerated when this closes oShe has a superficial area on the left patella and one  just under the left tibial tuberosity. 04/14/18; this is a patient with a chronically infected left total knee replacement that is not felt to be a candidate for further joint replacement surgery. She has a draining sinus in the popliteal fossa which drain sign no real fluid. Previously when this area has closed over she develops pain and swelling in the popliteal fossa similar to an abscess and it has to be IandD. oShe has a area over the anterior patella which is about 50% epithelialized and a small area below the left ischial tuberosity that is actually closed this week we've been using silver alginate 05/05/18; this is a patient I follow-up essentially palliative basis for a draining fistula site in the left popliteal fossa secondary to septic arthritis which is chronic and apparently not amenable to further orthopedic surgery. She also has a refractory area over the patella on the left. This at times is looked as though it's progressing towards closure although today it looks fairly stalled. We've been using silver alginate Electronic Signature(s) Signed: 05/05/2018 6:13:38 PM By: Linton Ham MD Entered By: Linton Ham on 05/05/2018 12:23:25 [name] ([id number]) -------------------------------------------------------------------------------- Physical Exam Details Patient Name: [name]. Date of Service: 05/05/2018 10:00 AM Medical Record Number: [id number] Patient Account Number: [id number] Date of Birth/Sex: 1934-08-24 (84 y.o. F) Treating RN: Roger Shelter Primary Care Provider: Loura Pardon Other Clinician: Referring Provider: Loura Pardon Treating Provider/Extender: Tito Dine in Treatment: 8 Constitutional Sitting or standing Blood Pressure is within target range for patient.. Pulse regular and within target range for patient.Marland Kitchen Respirations regular, non-labored and within target range.. Temperature is normal and within the target range for the  patient.Marland Kitchen appears in no distress. Notes wound exam; the original draining sinus remains intact leaking synovial fluid A stubborn superficial area on the left patella which was just about 100% epithelialized last time is now open again. There is superficial. There is no reason to believe that this has anything to do with the intra-articular infection Electronic Signature(s) Signed: 05/05/2018 6:13:38 PM By: Linton Ham MD Entered By: Linton Ham on 05/05/2018 12:26:50 [name] ([id number]) -------------------------------------------------------------------------------- Physician Orders Details Patient Name: [name]. Date of Service: 05/05/2018 10:00 AM Medical Record Number: [id number] Patient Account Number: [id number] Date of Birth/Sex: 11/29/1933 (84 y.o. F) Treating RN: Roger Shelter Primary Care Provider: Loura Pardon Other Clinician: Referring Provider: Loura Pardon Treating Provider/Extender: Ricard Dillon  Weeks in Treatment: 8 Verbal / Phone Orders: No Diagnosis Coding Wound Cleansing Wound #6 Left Knee o Cleanse wound with mild soap and water Wound #8 Left,Posterior Knee o Cleanse wound with mild soap and water Anesthetic (add to Medication List) Wound #6 Left Knee o Topical Lidocaine 4% cream applied to wound bed prior to debridement (In Clinic Only). Wound #8 Left,Posterior Knee o Topical Lidocaine 4% cream applied to wound bed prior to debridement (In Clinic Only). Primary Wound Dressing Wound #6 Left Knee o Hydrafera Blue Ready Transfer Wound #8 Left,Posterior Knee o ABD Pad Secondary Dressing Wound #6 Left Knee o Wilbur Wound #8 Left,Posterior Knee o Boardered Foam Dressing Dressing Change Frequency Wound #6 Left Knee o Change dressing every other day. Wound #8 Left,Posterior Knee o Change dressing every other day. Follow-up Appointments Wound #6 Left Knee o Return Appointment in 2 weeks. Wound #8  Left,Posterior Knee o Return Appointment in 2 weeks. Edema Control ALLIANNA, BEAUBIEN ([id number]) Wound #6 Left Knee o Elevate legs to the level of the heart and pump ankles as often as possible Wound #8 Left,Posterior Knee o Elevate legs to the level of the heart and pump ankles as often as possible Electronic Signature(s) Signed: 05/05/2018 5:14:04 PM By: Roger Shelter Signed: 05/05/2018 6:13:38 PM By: Linton Ham MD Entered By: Roger Shelter on 05/05/2018 11:11:45 [name] ([id number]) -------------------------------------------------------------------------------- Problem List Details Patient Name: [name]. Date of Service: 05/05/2018 10:00 AM Medical Record Number: [id number] Patient Account Number: [id number] Date of Birth/Sex: 13-Nov-1933 (84 y.o. 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 He was Medical vigorously washed Decision with saline in the Making : clinic. Change the primary dressing Hydrofera Blue M71.062 Abscess of bursa, left knee 03/10/2018 Yes Yes Status Complications Interventions No the patient has a draining fistula tract from the infected knee no additional change joint to the skin in the popliteal fossa. This drains interventions other Medical significantly and has to be changed 3 times a day than topical agents Decision to collect the Making : drainage. I have selected AVD pads. Inactive Problems Resolved Problems Electronic Signature(s) Signed: 05/05/2018  6:13:38 PM By: Linton Ham MD Entered By: Linton Ham on 05/05/2018 12:19:27 [name] ([id number]) -------------------------------------------------------------------------------- Progress Note Details Patient Name: [name]. Date of Service: 05/05/2018 10:00 AM Medical Record Number: [id number] Patient Account Number: [id number] Date of Birth/Sex: [birth year] (84 y.o. F) Treating RN: Roger Shelter Primary Care Provider: Loura Pardon Other Clinician: Referring Provider: Loura Pardon Treating Provider/Extender: Tito Dine in Treatment: 8 Subjective History of Present Illness (HPI) 10/28/16; this is an 84 year old woman who arrives with a complicated medical issue. 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 Initially in 2000 and apparently most recently in 2014. 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 Indeed she was hospitalized on 02/13/16 and discharged on 02/19/16. She was taken to the OR and it was found that the popliteal abscess was communicating with her joint space. Signed to feel fluid grew MSSA. Patient was treated with IandD of the abscess. She completed IV cefazolin  on 03/28/16 and since then has been on Keflex and rifampin for chronic suppression. Her infectious disease doctor is Dr.Shrestha at [facility]. The patient was seen in our [place] sister clinic through much of October and November 2017. At that point she had a draining sinus that eventually closed over. She noted some pain behind her knee surrounding Christmas day and was seen in our emergency room on 10/23/16. During this ER visit she underwent tendon incision and drainage of a left Baker's cyst and clear colored synovial fluid was obtained. Doxycycline was added for a week to her Keflex and rifampin. A CT scan was ordered but I don't think was ever done due to artifacts from hardware. Her wound culture was negative. She has been  using iodoform packing. She states her knee is painful when she stands on it for a period of time. She is not systemically unwell. She has a history of scleroderma or without is been under control for some period of time. 11/04/16 the culture that I did of this area last week showed coag-negative staph and a few enterococcus faecium. Enterococcus is ampicillin resistant. The coag negative staph is only sensitive to vancomycin and tetracycline. Previous joint infection was methicillin sensitive staph aureus. It seems I misunderstood what she said and that her infectious disease clinic appointment is this Friday and the orthopedic appointment was last Friday. I faxed the culture results to the infectious disease clinic but I've given him a copy of it today. Per the patient and her family they orthopedic surgeon did not want to do anything further to this joint. The patient is not systemically unwell in particular no fever or chills and the pain she has is really minimal 11/18/16; this is a patient who has an infected left knee prosthetic joint. She went to see infectious disease at [facility] who apparently did did not recommend any further antibiotic therapy. She continues to feel the same episodic pain but no systemic symptoms of particular no fever. The depth of the wound in the popliteal fossa of her left knee is 3.5 versus 5 cm last week 11/25/16 4.5 cm of depth today. Draining exit site. We have treating this with iodoform packing however I really have no expectation of healing and to be truthful the amount of drainage that is coming out of this knee I don't think closure of this site is necessarily considered a positive outcome. Her left knee remains warm but not painful there is an effusion she has no systemic symptoms no fever no chills or and she feels well 12/31/16. Still is same amount of depth today using a skinny at 4.6 cm. There is less drainage. We have been using iodoform packing and I really  have no expectation of healing and to be truthful closing this sinus probably is really not the best thing to consider. 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 She is not felt to be a candidate for a fourth replacement. In the meantime she does not complain of pain except when the dressing is being changed episodically. She has not been systemically unwell no fever or chills or other issues. 01/14/17; after the patient left on 3/7 her daughter tells me the next day the wound had closed over and therefore this is not intact since. Over the last 3 days the patient has noted significant pain in the popliteal fossa of the involved left knee. The patient has a chronically infected left total knee replacement and is on Keflex  and rifampin directed against MSSA. MSSA was the cause of the original identified joint infection treated at [facility]. 01/21/17; I saw this patient last week at which time there was intense erythema in the popliteal fossa, tenderness and a ballotable fluid collection. I did an IandD on this. Surprisingly the culture of this was negative although as a result of the IandD her tenderness and erythema in the area is resolved. We continue with the iodoform packing READMISSION 04/07/17; this is a patient I know from 2 prior admissions to this clinic starting in January of this year. Her history is essentially as noted above. 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 She has a draining sinus into the popliteal fossa of the left knee. This remains open and drains. She has been using iodoform packing to this area with gauze over the top. To be truthful I haven't really been certain whether ALMETTA, LIDDICOAT. ([id number]) having this area close would be helpful or harmful to this patient. Previously when it's closed she develops a lot more pain in the back of the popliteal fossa and more pain in the  knee. She is here today because the packing of the area has become increasingly painful in fact the patient describes this as a "torture chamber" experience. They are packing this wound twice a day. The patient follows with orthopedic surgery and infectious disease at [facility] although she hasn't seen them since she was last here in March. She denies any systemic symptoms including fever chills etc. She states except for the changing of the dressing her pain when she is mobilizing on the knee is 2/10. She also has a area on the left anterior patella in the incision that is excoriated open. The patient states this is itchy and she does a lot of scratching. They state this was here last time she was here but I have no recollection or documentation of this 04/21/17; patient was readmitted to clinic 2 weeks ago. She has a draining sinus in the left popliteal fossa and a chronically infected left total knee replacement. She also had an excoriation on the left anterior knee. She arrives in clinic today with all of this looking considerably better. The divot in the popliteal fossa is actually closed. READMISSION 07/15/17; the patient arrived today with a real opening of the draining sinus in her left popliteal fossa. This opened about a week ago after developing a painful "knot". Is been draining clear fluid. The patient's pain is a lot better. She is not been systemically unwell. She also is been dealing with an area on the left anterior leg just below the tibial plateau. She also had this last time. Her daughter states that she scratches this area continuously. I have reviewed the last infectious disease note from August. 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 Much to my chagrin they seem reluctant to tap the joint. A culture of purulent material that came out of the popliteal fossa earlier this year cultured coag-negative  staph aureus [not an uncommon cause of prosthetic joint infection]. However they really ignored this and they state that the source was questionable. If they believe this I am wondering why they're so reluctant to tap the fluid. She has not seen orthopedics 07/22/17; patient's sinus tract in the popliteal fossa about 2.5 cm. The area on the anterior part of her knee looks  a lot better. We have been using iodoform packing to the popliteal fossa, collagen to the anterior part of her knee. She is not experiencing undue pain or systemic symptoms 08/05/17; patient has a sinus tract in the popliteal fossa on the left. 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 She does not have any options for further replacements she is only been offered an above-knee amputation. 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 She has not been systemically unwell no fever. There is some pain and swelling in the knee but all in all this is not unbearable. They've been using iodoform packing 09/02/17; continued sinus tract on the popliteal fossa on the left as part of a chronic infection on a left total knee replacement. She follows with infectious disease at [facility] although the patient and her daughter are not exactly sure when her next appointment is. She has not been systemically unwell specifically denies fever chills. She has some pain in the left knee however she is reluctant to take even simple analgesics because of the risk of" addiction" 09/30/17; continued sinus tract in the popliteal fossa on the left as part of a chronic infection on the left total knee replacement. She has not been following recently with infectious disease at [facility]. She has some pain in the left knee although this is not major and not changed over the last recent months. Depth of the probing area is 1.9 cm READMISSION 03/10/18; this is a patient we have seen in this clinic several times before  predominantly related to a draining sinus tract in the left popliteal fossa as part of a chronic infection in the left total knee replacement. She sought infectious disease in February. A discontinue doxycycline she was on at the time and continued her on long-standing prophylactic cephalexin which she is still on. 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 I see she was seen by pulmonary in April with chronic respiratory failure with hypoxia and cor pulmonale and pulmonary hypertension. She comes clinic this time with the sudden onset of 2 small wounds on the right anterior patella and the right anterior lower leg just below the tibial tuberosity. It is not really clear how this happened although presumably minor trauma that the patient didn't recognize. Her daughter states that she simply noticed blood on the sheets. She may have picked at her skin as well. I believe we've had superficial wounds on the patella previously in this clinic didn't seem to heal over fairly easily. The patient has not been systemically unwell no fever no chills she does not have a lot of pain in the knee just occasionally when she is up on it 03/17/18; this is a patient we have seen in this clinic previously. She has a chronically infected left total knee replacement that is not felt by [facility] to be a candidate for joint replacement. Previous organism has been MSSA. She has a draining sinus in the left popliteal fossa. I think the area here is been stable. JAMYIA, FORTUNE. ([id number]) She has superficial areas over the left patella and just below the tibial tuberosity. We've using silver collagen. The area over the patellas just about closed and these area under the tibial tuberosity is smaller this week. 03/31/18; patient with a chronically infected left total knee replacement that is not felt by [facility] to be candidate for joint replacement. She has a draining area in the popliteal  fossa  which is chronically allowed to drain. She has not tolerated when this closes She has a superficial area on the left patella and one just under the left tibial tuberosity. 04/14/18; this is a patient with a chronically infected left total knee replacement that is not felt to be a candidate for further joint replacement surgery. She has a draining sinus in the popliteal fossa which drain sign no real fluid. Previously when this area has closed over she develops pain and swelling in the popliteal fossa similar to an abscess and it has to be IandD. 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 She also has a refractory area over the patella on the left. This at times is looked as though it's progressing towards closure although today it looks fairly stalled. We've been using silver alginate Objective Constitutional Sitting or standing Blood Pressure is within target range for patient.. Pulse regular and within target range for patient.Marland Kitchen Respirations regular, non-labored and within target range.. Temperature is normal and within the target range for the patient.Marland Kitchen appears in no distress. Vitals Time Taken: 10:16 AM, Height: 66 in, Temperature: 97.5 F, Pulse: 55 bpm, Respiratory Rate: 16 breaths/min, Blood Pressure: 124/55 mmHg. General Notes: wound exam; the original draining sinus remains intact leaking synovial fluid A stubborn superficial area on the left patella which was just about 100% epithelialized last time is now open again. There is superficial. There is no reason to believe that this has anything to do with the  intra-articular infection Integumentary (Hair, Skin) Wound #6 status is Open. Original cause of wound was Gradually Appeared. The wound is located on the Left Knee. The wound measures 1.6cm length x 1.1cm width x 0.2cm depth; 1.382cm^2 area and 0.276cm^3 volume. Wound #8 status is Open. Original cause of wound was Surgical Injury. The wound is located on the Left,Posterior Knee. The wound measures 0.2cm length x 0.2cm width x 0.2cm depth; 0.031cm^2 area and 0.006cm^3 volume. 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 Wound #8 Left,Posterior Knee: Topical Lidocaine 4% cream applied to wound bed prior to debridement (In Clinic Only). Primary Wound Dressing: Wound #6 Left Knee: Hydrafera Blue Ready Transfer Wound #8 Left,Posterior Knee: ABD Pad Secondary Dressing: Wound #6 Left Knee: [place] Wound #8 Left,Posterior Knee: Boardered Foam Dressing Dressing Change Frequency: Wound #6 Left Knee: Change dressing every other day. Wound #8 Left,Posterior Knee: Change dressing every other day. Follow-up Appointments: Wound #6 Left Knee: Return Appointment in 2 weeks. Wound #8 Left,Posterior Knee: Return Appointment in 2 weeks. Edema Control: Wound #6 Left Knee: Elevate legs to the level of the heart and pump ankles as often as possible Wound #8 Left,Posterior Knee: Elevate legs to the level of the heart and pump ankles as often as possible  Medical Decision Making Non-pressure chronic ulcer of unspecified part of left lower leg with other specified  severity 03/10/2018 Status: No change Complications: wound on the superficial left patella Interventions: this is not any better at all. He was vigorously washed with saline in the clinic. Change the primary dressing Hydrofera Blue Abscess of bursa, left knee 03/10/2018 Status: No change Complications: the patient has a draining fistula tract from the infected knee joint to the skin in the popliteal fossa. This drains significantly and has to be changed 3 times a day Interventions: no additional interventions other than topical agents to collect the drainage. I have selected AVD pads. ISRAELLA, HUBERT. ([id number]) #1 I cleansed the area on the left patella with saline and change the dressing Hydrofera Blue. This looks healthy and I don't have an obvious reason why this will close. #2 draining area on the left popliteal fossa needs to be left intact. When this is closed it results in an almost abscess-like picture. Electronic Signature(s) Signed: 05/05/2018 6:13:38 PM By: Linton Ham MD Entered By: Linton Ham on 05/05/2018 12:28:05 [name] ([id number]) -------------------------------------------------------------------------------- SuperBill Details Patient Name: [name]. Date of Service: 05/05/2018 Medical Record Number: [id number] Patient Account Number: [id number] Date of Birth/Sex: 12-06-1933 (84 y.o. 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

## 2018-05-06 NOTE — Progress Notes (Signed)
AKAIYA, TOUCHETTE ([id number]) Visit Report for 05/05/2018 Arrival Information Details [name], [name]. Date of Service: 05/05/2018 10:00 AM Medical Record Number: [id number] Patient Account Number: [id number] Date of Birth/Sex: Jun 24, 1934 (84 y.o. F) Treating RN: Montey Hora Primary Care Jasmin Trumbull: Loura Pardon Other Clinician: Referring Viviano Bir: Loura Pardon Treating Ramesses Crampton/Extender: Tito Dine in Treatment: 8 Visit Information History Since Last Visit Added or deleted any medications: No Patient Arrived: [place] Any new allergies or adverse reactions: No Arrival Time: 10:14 Had a fall or experienced change in No Accompanied By: daughter activities of daily living that may affect Transfer Assistance: None risk of falls: Patient Identification Verified: Yes Signs or symptoms of abuse/neglect since last visito No Secondary Verification Process Completed: Yes Hospitalized since last visit: No Patient Requires Transmission-Based Precautions: No Implantable device outside of the clinic excluding No Patient Has Alerts: No cellular tissue based products placed in the center since last visit: Has Dressing in Place as Prescribed: Yes Pain Present Now: No Electronic Signature(s) Signed: 05/05/2018 5:21:12 PM By: Montey Hora Entered By: Montey Hora on 05/05/2018 10:28:23 [name] ([id number]) -------------------------------------------------------------------------------- Clinic Level of Care Assessment Details Patient Name: [name]. Date of Service: 05/05/2018 10:00 AM Medical Record Number: [id number] Patient Account Number: [id number] Date of Birth/Sex: Jan 08, 1934 (84 y.o. F) Treating RN: Roger Shelter Primary Care Candler Ginsberg: Loura Pardon Other Clinician: Referring Donique Hammonds: Loura Pardon Treating Shallyn Constancio/Extender: Tito Dine in Treatment: 8 Clinic Level of Care Assessment Items TOOL 4 Quantity Score X - Use when only an  EandM is performed on FOLLOW-UP visit 1 0 ASSESSMENTS - Nursing Assessment / Reassessment X - Reassessment of Co-morbidities (includes updates in patient status) 1 10 X- 1 5 Reassessment of Adherence to Treatment Plan ASSESSMENTS - Wound and Skin Assessment / Reassessment []  - Simple Wound Assessment / Reassessment - one wound 0 X- 2 5 Complex Wound Assessment / Reassessment - multiple wounds []  - 0 Dermatologic / Skin Assessment (not related to wound area) ASSESSMENTS - Focused Assessment []  - Circumferential Edema Measurements - multi extremities 0 []  - 0 Nutritional Assessment / Counseling / Intervention []  - 0 Lower Extremity Assessment (monofilament, tuning fork, pulses) []  - 0 Peripheral Arterial Disease Assessment (using hand held doppler) ASSESSMENTS - Ostomy and/or Continence Assessment and Care []  - Incontinence Assessment and Management 0 []  - 0 Ostomy Care Assessment and Management (repouching, etc.) PROCESS - Coordination of Care X - Simple Patient / Family Education for ongoing care 1 15 []  - 0 Complex (extensive) Patient / Family Education for ongoing care []  - 0 Staff obtains Programmer, systems, Records, Test Results / Process Orders []  - 0 Staff telephones HHA, Nursing Homes / Clarify orders / etc []  - 0 Routine Transfer to another Facility (non-emergent condition) []  - 0 Routine Hospital Admission (non-emergent condition) []  - 0 New Admissions / Biomedical engineer / Ordering NPWT, Apligraf, etc. []  - 0 Emergency Hospital Admission (emergent condition) X- 1 10 Simple Discharge Coordination YURI, FLENER ([id number]) []  - 0 Complex (extensive) Discharge Coordination PROCESS - Special Needs []  - Pediatric / Minor Patient Management 0 []  - 0 Isolation Patient Management []  - 0 Hearing / Language / Visual special needs []  - 0 Assessment of Community assistance (transportation, D/C planning, etc.) []  - 0 Additional assistance / Altered mentation []  -  0 Support Surface(s) Assessment (bed, cushion, seat, etc.) INTERVENTIONS - Wound Cleansing / Measurement []  - Simple Wound Cleansing - one wound 0 X- 2 5 Complex Wound Cleansing -  multiple wounds X- 1 5 Wound Imaging (photographs - any number of wounds) []  - 0 Wound Tracing (instead of photographs) []  - 0 Simple Wound Measurement - one wound X- 2 5 Complex Wound Measurement - multiple wounds INTERVENTIONS - Wound Dressings X - Small Wound Dressing one or multiple wounds 2 10 []  - 0 Medium Wound Dressing one or multiple wounds []  - 0 Large Wound Dressing one or multiple wounds []  - 0 Application of Medications - topical []  - 0 Application of Medications - injection INTERVENTIONS - Miscellaneous []  - External ear exam 0 []  - 0 Specimen Collection (cultures, biopsies, blood, body fluids, etc.) []  - 0 Specimen(s) / Culture(s) sent or taken to Lab for analysis []  - 0 Patient Transfer (multiple staff / Civil Service fast streamer / Similar devices) []  - 0 Simple Staple / Suture removal (25 or less) []  - 0 Complex Staple / Suture removal (26 or more) []  - 0 Hypo / Hyperglycemic Management (close monitor of Blood Glucose) []  - 0 Ankle / Brachial Index (ABI) - do not check if billed separately X- 1 5 Vital Signs Tierce, JEANE CASHATT ([id number]) Has the patient been seen at the hospital within the last three years: Yes Total Score: 100 Level Of Care: New/Established - Level 3 Electronic Signature(s) Signed: 05/05/2018 5:14:04 PM By: Roger Shelter Entered By: Roger Shelter on 05/05/2018 11:13:34 [name] ([id number]) -------------------------------------------------------------------------------- Lower Extremity Assessment Details Patient Name: [name]. Date of Service: 05/05/2018 10:00 AM Medical Record Number: [id number] Patient Account Number: [id number] Date of Birth/Sex: May 22, 1934 (84 y.o. F) Treating RN: Montey Hora Primary Care Makhari Dovidio: Loura Pardon Other  Clinician: Referring Riannah Stagner: Loura Pardon Treating Sondos Wolfman/Extender: Tito Dine in Treatment: 8 Vascular Assessment Pulses: Dorsalis Pedis Palpable: [Left:Yes] Posterior Tibial Extremity colors, hair growth, and conditions: Extremity Color: [Left:Normal] Hair Growth on Extremity: [Left:Yes] Temperature of Extremity: [Left:Warm] Capillary Refill: [Left:< 3 seconds] Toe Nail Assessment Left: Right: Thick: Yes Discolored: No Deformed: No Improper Length and Hygiene: No Electronic Signature(s) Signed: 05/05/2018 5:21:12 PM By: Montey Hora Entered By: Montey Hora on 05/05/2018 10:29:22 [name] ([id number]) -------------------------------------------------------------------------------- Multi Wound Chart Details Patient Name: [name]. Date of Service: 05/05/2018 10:00 AM Medical Record Number: [id number] Patient Account Number: [id number] Date of Birth/Sex: 07-Dec-1933 (84 y.o. F) Treating RN: Roger Shelter Primary Care Lesean Woolverton: Loura Pardon Other Clinician: Referring Hensley Aziz: Loura Pardon Treating Coalton Arch/Extender: Tito Dine in Treatment: 8 Vital Signs Height(in): 66 Pulse(bpm): 55 Weight(lbs): Blood Pressure(mmHg): 124/55 Body Mass Index(BMI): Temperature(F): 97.5 Respiratory Rate 16 (breaths/min): Photos: [N/A:N/A] Wound Location: Left Knee Left, Posterior Knee N/A Wounding Event: Gradually Appeared Surgical Injury N/A Primary Etiology: Lesion Infection - not elsewhere N/A classified Date Acquired: 02/24/2018 02/24/2018 N/A Weeks of Treatment: 8 8 N/A Wound Status: Open Open N/A Measurements L x W x D 1.6x1.1x0.2 0.2x0.2x0.2 N/A (cm) Area (cm) : 1.382 0.031 N/A Volume (cm) : 0.276 0.006 N/A % Reduction in Area: -83.30% 56.30% N/A % Reduction in Volume: -268.00% 57.10% N/A Classification: Partial Thickness Partial Thickness N/A Periwound Skin Texture: No Abnormalities Noted No Abnormalities Noted  N/A Periwound Skin Moisture: No Abnormalities Noted No Abnormalities Noted N/A Periwound Skin Color: No Abnormalities Noted No Abnormalities Noted N/A Tenderness on Palpation: No No N/A Treatment Notes Electronic Signature(s) Signed: 05/05/2018 6:13:38 PM By: Linton Ham MD Entered By: Linton Ham on 05/05/2018 12:21:47 [name] ([id number]) -------------------------------------------------------------------------------- [place] Details Patient Name: [name]. Date of Service: 05/05/2018 10:00 AM Medical Record Number: [id number]  Patient Account Number: [id number] Date of Birth/Sex: 1934/08/14 (84 y.o. F) Treating RN: Roger Shelter Primary Care Manu Rubey: Loura Pardon Other Clinician: Referring Jurni Cesaro: Loura Pardon Treating Gaynelle Pastrana/Extender: Tito Dine in Treatment: 8 Active Inactive ` Abuse / Safety / Falls / Self Care Management Nursing Diagnoses: History of Falls Goals: Patient will not experience any injury related to falls Date Initiated: 03/10/2018 Target Resolution Date: 04/09/2018 Goal Status: Active Patient will remain injury free related to falls Date Initiated: 03/10/2018 Target Resolution Date: 04/09/2018 Goal Status: Active Interventions: Assess fall risk on admission and as needed Notes: ` Orientation to the Wound Care Program Nursing Diagnoses: Knowledge deficit related to the wound healing center program Goals: Patient/caregiver will verbalize understanding of the [place] Date Initiated: 03/10/2018 Target Resolution Date: 03/22/2018 Goal Status: Active Interventions: Provide education on orientation to the wound center Notes: ` Venous Leg Ulcer Nursing Diagnoses: Knowledge deficit related to disease process and management Goals: Patient will maintain optimal edema control Date Initiated: 03/10/2018 Target Resolution Date: 03/26/2018 EVEREST, HACKING. ([id number]) Goal Status:  Active Interventions: Assess peripheral edema status every visit. Notes: ` Wound/Skin Impairment Nursing Diagnoses: Impaired tissue integrity Goals: Ulcer/skin breakdown will have a volume reduction of 50% by week 8 Date Initiated: 03/10/2018 Target Resolution Date: 05/10/2018 Goal Status: Active Interventions: Assess ulceration(s) every visit Treatment Activities: Skin care regimen initiated : 03/10/2018 Topical wound management initiated : 03/10/2018 Notes: Electronic Signature(s) Signed: 05/05/2018 5:14:04 PM By: Roger Shelter Entered By: Roger Shelter on 05/05/2018 11:04:35 [name] ([id number]) -------------------------------------------------------------------------------- Pain Assessment Details Patient Name: [name]. Date of Service: 05/05/2018 10:00 AM Medical Record Number: [id number] Patient Account Number: [id number] Date of Birth/Sex: [birth year] (84 y.o. F) Treating RN: Montey Hora Primary Care Tavius Turgeon: Loura Pardon Other Clinician: Referring Cleotis Sparr: Loura Pardon Treating Julissa Browning/Extender: Tito Dine in Treatment: 8 Active Problems Location of Pain Severity and Description of Pain Patient Has Paino No Site Locations Pain Management and Medication Current Pain Management: Electronic Signature(s) Signed: 05/05/2018 5:21:12 PM By: Montey Hora Entered By: Montey Hora on 05/05/2018 10:28:33 [name] ([id number]) -------------------------------------------------------------------------------- Wound Assessment Details Patient Name: [name]. Date of Service: 05/05/2018 10:00 AM Medical Record Number: [id number] Patient Account Number: [id number] Date of Birth/Sex: 10/26/1934 (84 y.o. F) Treating RN: Montey Hora Primary Care Billy Rocco: Loura Pardon Other Clinician: Referring Joana Nolton: Loura Pardon Treating Nickey Kloepfer/Extender: Tito Dine in Treatment: 8 Wound Status Wound Number: 6 Primary  Etiology: Lesion Wound Location: Left Knee Wound Status: Open Wounding Event: Gradually Appeared Date Acquired: 02/24/2018 Weeks Of Treatment: 8 Clustered Wound: No Photos Photo Uploaded By: Montey Hora on 05/05/2018 11:43:28 Wound Measurements Length: (cm) 1.6 Width: (cm) 1.1 Depth: (cm) 0.2 Area: (cm) 1.382 Volume: (cm) 0.276 % Reduction in Area: -83.3% % Reduction in Volume: -268% Wound Description Classification: Partial Thickness Periwound Skin Texture Texture Color No Abnormalities Noted: No No Abnormalities Noted: No Moisture No Abnormalities Noted: No Electronic Signature(s) Signed: 05/05/2018 5:21:12 PM By: Montey Hora Entered By: Montey Hora on 05/05/2018 10:27:54 [name] ([id number]) -------------------------------------------------------------------------------- Wound Assessment Details Patient Name: [name]. Date of Service: 05/05/2018 10:00 AM Medical Record Number: [id number] Patient Account Number: [id number] Date of Birth/Sex: 1934/08/19 (84 y.o. F) Treating RN: Montey Hora Primary Care Sonita Michiels: Loura Pardon Other Clinician: Referring Brailon Don: Loura Pardon Treating Anayansi Rundquist/Extender: Tito Dine in Treatment: 8 Wound Status Wound Number: 8 Primary Etiology: Infection - not elsewhere classified Wound Location: Left, Posterior Knee Wound Status: Open Wounding Event:  Surgical Injury Date Acquired: 02/24/2018 Weeks Of Treatment: 8 Clustered Wound: No Wound under treatment by Neylan Koroma outside of [place] Uploaded By: Montey Hora on 05/05/2018 11:43:38 Wound Measurements Length: (cm) 0.2 Width: (cm) 0.2 Depth: (cm) 0.2 Area: (cm) 0.031 Volume: (cm) 0.006 % Reduction in Area: 56.3% % Reduction in Volume: 57.1% Wound Description Classification: Partial Thickness Periwound Skin Texture Texture Color No Abnormalities Noted: No No Abnormalities Noted: No Moisture No Abnormalities  Noted: No Electronic Signature(s) Signed: 05/05/2018 5:21:12 PM By: Montey Hora Entered By: Montey Hora on 05/05/2018 10:27:55 [name] ([id number]) -------------------------------------------------------------------------------- Vitals Details Patient Name: [name]. Date of Service: 05/05/2018 10:00 AM Medical Record Number: [id number] Patient Account Number: [id number] Date of Birth/Sex: [birth year] (84 y.o. F) Treating RN: Montey Hora Primary Care Ori Trejos: Loura Pardon Other Clinician: Referring Shaquanna Lycan: Loura Pardon Treating Hadas Jessop/Extender: Tito Dine in Treatment: 8 Vital Signs Time Taken: 10:16 Temperature (F): 97.5 Height (in): 66 Pulse (bpm): 55 Respiratory Rate (breaths/min): 16 Blood Pressure (mmHg): 124/55 Reference Range: 80 - 120 mg / dl Electronic Signature(s) Signed: 05/05/2018 5:21:12 PM By: Montey Hora Entered By: Montey Hora on 05/05/2018 10:29:01

## 2018-05-19 ENCOUNTER — Encounter: Payer: Medicare Other | Admitting: Internal Medicine

## 2018-05-19 DIAGNOSIS — L97928 Non-pressure chronic ulcer of unspecified part of left lower leg with other specified severity: Secondary | ICD-10-CM | POA: Diagnosis not present

## 2018-05-26 ENCOUNTER — Encounter: Payer: Medicare Other | Admitting: Internal Medicine

## 2018-05-26 DIAGNOSIS — L97928 Non-pressure chronic ulcer of unspecified part of left lower leg with other specified severity: Secondary | ICD-10-CM | POA: Diagnosis not present

## 2018-05-28 NOTE — Progress Notes (Addendum)
[name], [name] ([id number]) Visit Report for 05/19/2018 Arrival Information Details [name], [name]. Date of Service: 05/19/2018 8:00 AM Medical Record Number: [id number] Patient Account Number: [id number] Date of Birth/Sex: 11/09/1933 (84 y.o. F) Treating RN: Cornell Barman Primary Care Samaad Hashem: Loura Pardon Other Clinician: Referring Elyna Pangilinan: Loura Pardon Treating Elesia Pemberton/Extender: Tito Dine in Treatment: 10 Visit Information History Since Last Visit Added or deleted any medications: No Patient Arrived: Walker Any new allergies or adverse reactions: No Arrival Time: 14:31 Had a fall or experienced change in No Accompanied By: daughter activities of daily living that may affect Transfer Assistance: None risk of falls: Patient Identification Verified: Yes Signs or symptoms of abuse/neglect since last visito No Secondary Verification Process Completed: Yes Hospitalized since last visit: No Patient Requires Transmission-Based Precautions: No Has Dressing in Place as Prescribed: Yes Patient Has Alerts: No Pain Present Now: No Electronic Signature(s) Signed: 05/24/2018 2:32:38 PM By: Gretta Cool, BSN, RN, CWS, Kim RN, BSN Entered By: Gretta Cool, BSN, RN, CWS, Kim on 05/24/2018 14:32:37 [name] ([id number]) -------------------------------------------------------------------------------- Clinic Level of Care Assessment Details [name]. Date of Service: 05/19/2018 8:00 AM Medical Record Number: [id number] Patient Account Number: [id number] Date of Birth/Sex: 1934-10-20 (84 y.o. F) Treating RN: Cornell Barman Primary Care Thomas Rhude: Loura Pardon Other Clinician: Referring Jersey Ravenscroft: Loura Pardon Treating Ezmeralda Stefanick/Extender: Tito Dine in Treatment: 10 Clinic Level of Care Assessment Items TOOL 4 Quantity Score []  - Use when only an EandM is performed on FOLLOW-UP visit 0 ASSESSMENTS - Nursing Assessment / Reassessment []  - Reassessment  of Co-morbidities (includes updates in patient status) 0 X- 1 5 Reassessment of Adherence to Treatment Plan ASSESSMENTS - Wound and Skin Assessment / Reassessment []  - Simple Wound Assessment / Reassessment - one wound 0 X- 2 5 Complex Wound Assessment / Reassessment - multiple wounds []  - 0 Dermatologic / Skin Assessment (not related to wound area) ASSESSMENTS - Focused Assessment []  - Circumferential Edema Measurements - multi extremities 0 []  - 0 Nutritional Assessment / Counseling / Intervention []  - 0 Lower Extremity Assessment (monofilament, tuning fork, pulses) []  - 0 Peripheral Arterial Disease Assessment (using hand held doppler) ASSESSMENTS - Ostomy and/or Continence Assessment and Care []  - Incontinence Assessment and Management 0 []  - 0 Ostomy Care Assessment and Management (repouching, etc.) PROCESS - Coordination of Care X - Simple Patient / Family Education for ongoing care 1 15 []  - 0 Complex (extensive) Patient / Family Education for ongoing care []  - 0 Staff obtains Programmer, systems, Records, Test Results / Process Orders []  - 0 Staff telephones HHA, Nursing Homes / Clarify orders / etc []  - 0 Routine Transfer to another Facility (non-emergent condition) []  - 0 Routine Hospital Admission (non-emergent condition) []  - 0 New Admissions / Biomedical engineer / Ordering NPWT, Apligraf, etc. []  - 0 Emergency Hospital Admission (emergent condition) X- 1 10 Simple Discharge Coordination [name], [name] ([id number]) []  - 0 Complex (extensive) Discharge Coordination PROCESS - Special Needs []  - Pediatric / Minor Patient Management 0 []  - 0 Isolation Patient Management []  - 0 Hearing / Language / Visual special needs []  - 0 Assessment of Community assistance (transportation, D/C planning, etc.) []  - 0 Additional assistance / Altered mentation []  - 0 Support Surface(s) Assessment (bed, cushion, seat, etc.) INTERVENTIONS - Wound Cleansing / Measurement []  -  Simple Wound Cleansing - one wound 0 X- 2 5 Complex Wound Cleansing - multiple wounds []  - 0 Wound Imaging (photographs - any number of  wounds) []  - 0 Wound Tracing (instead of photographs) []  - 0 Simple Wound Measurement - one wound X- 2 5 Complex Wound Measurement - multiple wounds INTERVENTIONS - Wound Dressings X - Small Wound Dressing one or multiple wounds 1 10 X- 1 15 Medium Wound Dressing one or multiple wounds []  - 0 Large Wound Dressing one or multiple wounds []  - 0 Application of Medications - topical []  - 0 Application of Medications - injection INTERVENTIONS - Miscellaneous []  - External ear exam 0 []  - 0 Specimen Collection (cultures, biopsies, blood, body fluids, etc.) []  - 0 Specimen(s) / Culture(s) sent or taken to Lab for analysis []  - 0 Patient Transfer (multiple staff / Civil Service fast streamer / Similar devices) []  - 0 Simple Staple / Suture removal (25 or less) []  - 0 Complex Staple / Suture removal (26 or more) []  - 0 Hypo / Hyperglycemic Management (close monitor of Blood Glucose) []  - 0 Ankle / Brachial Index (ABI) - do not check if billed separately X- 1 5 Vital Signs [name], [name] ([id number]) Has the patient been seen at the hospital within the last three years: Yes Total Score: 90 Level Of Care: New/Established - Level 3 Electronic Signature(s) Signed: 05/28/2018 6:17:48 PM By: Gretta Cool, BSN, RN, CWS, Kim RN, BSN Entered By: Gretta Cool, BSN, RN, CWS, Kim on 05/24/2018 14:45:18 [name] ([id number]) -------------------------------------------------------------------------------- Encounter Discharge Information Details [name]. Date of Service: 05/19/2018 8:00 AM Medical Record Number: [id number] Patient Account Number: [id number] Date of Birth/Sex: Jul 04, 1934 (84 y.o. F) Treating RN: Cornell Barman Primary Care Marae Cottrell: Loura Pardon Other Clinician: Referring Aitana Burry: Loura Pardon Treating Seraphim Affinito/Extender: Tito Dine  in Treatment: 10 Encounter Discharge Information Items Discharge Condition: Stable Ambulatory Status: Walker Discharge Destination: Home Transportation: Private Auto Accompanied By: daughter Schedule Follow-up Appointment: Yes Clinical Summary of Care: Electronic Signature(s) Signed: 05/24/2018 2:47:37 PM By: Gretta Cool, BSN, RN, CWS, Kim RN, BSN Entered By: Gretta Cool, BSN, RN, CWS, Kim on 05/24/2018 14:47:36 [name] ([id number]) -------------------------------------------------------------------------------- General Visit Notes Details [name]. Date of Service: 05/19/2018 8:00 AM Medical Record Number: [id number] Patient Account Number: [id number] Date of Birth/Sex: Feb 20, 1934 (84 y.o. F) Treating RN: Cornell Barman Primary Care Anaise Sterbenz: Loura Pardon Other Clinician: Referring Ariyon Gerstenberger: Loura Pardon Treating Kelten Enochs/Extender: Tito Dine in Treatment: 10 Notes Late entry of documentation due to EMR being unavailable. RN entering all notes from visit. Electronic Signature(s) Signed: 05/24/2018 2:40:17 PM By: Gretta Cool, BSN, RN, CWS, Kim RN, BSN Entered By: Gretta Cool, BSN, RN, CWS, Kim on 05/24/2018 14:40:16 [name] ([id number]) -------------------------------------------------------------------------------- Lower Extremity Assessment Details [name], [name]. Date of Service: 05/19/2018 8:00 AM Medical Record Number: [id number] Patient Account Number: [id number] Date of Birth/Sex: Aug 08, 1934 (84 y.o. F) Treating RN: Cornell Barman Primary Care Liddie Chichester: Loura Pardon Other Clinician: Referring Aleathea Pugmire: Loura Pardon Treating Swan Zayed/Extender: Tito Dine in Treatment: 10 Vascular Assessment Pulses: Dorsalis Pedis Palpable: [Left:No] Doppler Audible: [Left:Yes] Posterior Tibial Extremity colors, hair growth, and conditions: Extremity Color: [Left:Mottled] Hair Growth on Extremity: [Left:Yes] Temperature of Extremity:  [Left:Cool] Capillary Refill: [Left:> 3 seconds] Electronic Signature(s) Signed: 05/24/2018 2:38:21 PM By: Gretta Cool, BSN, RN, CWS, Kim RN, BSN Entered By: Gretta Cool, BSN, RN, CWS, Kim on 05/24/2018 14:38:20 [name] ([id number]) -------------------------------------------------------------------------------- Multi Wound Chart Details [name]. Date of Service: 05/19/2018 8:00 AM Medical Record Number: [id number] Patient Account Number: [id number] Date of Birth/Sex: 1934/01/09 (84 y.o. F) Treating RN: Cornell Barman Primary Care  Damica Gravlin: Loura Pardon Other Clinician: Referring Sereniti Wan: Loura Pardon Treating Tonja Jezewski/Extender: Tito Dine in Treatment: 10 Vital Signs Height(in): 40 Pulse(bpm): 27 Weight(lbs): Blood Pressure(mmHg): 133/69 Body Mass Index(BMI): Temperature(F): 97.6 Respiratory Rate 16 (breaths/min): Photos: [6:No Photos] [8:No Photos] [N/A:N/A] Wound Location: [6:Left Knee] [8:Left Knee - Posterior] [N/A:N/A] Wounding Event: [6:Gradually Appeared] [8:Surgical Injury] [N/A:N/A] Primary Etiology: [6:Lesion] [8:Infection - not elsewhere classified] [N/A:N/A] Comorbid History: [6:Cataracts, Sleep Apnea, Arrhythmia, Hypertension, Scleroderma] [8:Cataracts, Sleep Apnea, Arrhythmia, Hypertension, Scleroderma] [N/A:N/A] Date Acquired: [6:02/24/2018] [8:02/24/2018] [N/A:N/A] Weeks of Treatment: [6:10] [8:10] [N/A:N/A] Wound Status: [6:Open] [8:Open] [N/A:N/A] Measurements L x W x D [6:1.8x0.8x0.1] [8:0.2x0.2x0.5] [N/A:N/A] (cm) Area (cm) : [6:1.131] [8:0.031] [N/A:N/A] Volume (cm) : [6:0.113] [8:0.016] [N/A:N/A] % Reduction in Area: [6:-50.00%] [8:56.30%] [N/A:N/A] % Reduction in Volume: [6:-50.70%] [8:-14.30%] [N/A:N/A] Classification: [6:Partial Thickness] [8:Partial Thickness] [N/A:N/A] Exudate Amount: [6:Medium] [8:Large] [N/A:N/A] Exudate Type: [6:Serosanguineous] [8:Serosanguineous] [N/A:N/A] Exudate Color: [6:red, brown] [8:red, brown]  [N/A:N/A] Wound Margin: [6:Flat and Intact] [8:Flat and Intact] [N/A:N/A] Granulation Amount: [6:Large (67-100%)] [8:N/A] [N/A:N/A] Granulation Quality: [6:Red] [8:N/A] [N/A:N/A] Necrotic Amount: [6:Small (1-33%)] [8:N/A] [N/A:N/A] Necrotic Tissue: [6:Eschar, Adherent Slough] [8:N/A] [N/A:N/A] Exposed Structures: [6:Fascia: No Fat Layer (Subcutaneous Tissue) Exposed: No Tendon: No Muscle: No Joint: No Bone: No] [8:N/A] [N/A:N/A] Epithelialization: [6:None] [8:None] [N/A:N/A] Periwound Skin Texture: [6:No Abnormalities Noted] [8:No Abnormalities Noted] [N/A:N/A] Periwound Skin Moisture: [6:No Abnormalities Noted] [8:No Abnormalities Noted] [N/A:N/A] Periwound Skin Color: [6:No Abnormalities Noted] [8:No Abnormalities Noted] [N/A:N/A] Tenderness on Palpation: No No N/A Treatment Notes Wound #6 (Left Knee) 1. Cleansed with: Clean wound with Normal Saline 2. Anesthetic Topical Lidocaine 4% cream to wound bed prior to debridement 4. Dressing Applied: Other dressing (specify in notes) 5. Secondary [place] Notes Hydrofera Blue on anterior knee Wound #8 (Left, Posterior Knee) 1. Cleansed with: Clean wound with Normal Saline 2. Anesthetic Topical Lidocaine 4% cream to wound bed prior to debridement 4. Dressing Applied: Other dressing (specify in notes) 5. Secondary [place] Notes Hydrofera Blue on anterior knee Electronic Signature(s) Signed: 05/26/2018 8:05:08 AM By: Linton Ham MD Previous Signature: 05/24/2018 2:40:59 PM Version By: Gretta Cool, BSN, RN, CWS, Kim RN, BSN Entered By: Linton Ham on 05/24/2018 21:13:46 [name] ([id number]) -------------------------------------------------------------------------------- [place] Details [name], [name]. Date of Service: 05/19/2018 8:00 AM Medical Record Number: [id number] Patient Account Number: [id number] Date of Birth/Sex: [birth year] (84 y.o.  F) Treating RN: Cornell Barman Primary Care Alverna Fawley: Loura Pardon Other Clinician: Referring Omelia Marquart: Loura Pardon Treating Avarey Yaeger/Extender: Tito Dine in Treatment: 10 Active Inactive ` Abuse / Safety / Falls / Self Care Management Nursing Diagnoses: History of Falls Goals: Patient will not experience any injury related to falls Date Initiated: 03/10/2018 Target Resolution Date: 04/09/2018 Goal Status: Active Patient will remain injury free related to falls Date Initiated: 03/10/2018 Target Resolution Date: 04/09/2018 Goal Status: Active Interventions: Assess fall risk on admission and as needed Notes: ` Orientation to the Wound Care Program Nursing Diagnoses: Knowledge deficit related to the wound healing center program Goals: Patient/caregiver will verbalize understanding of the [place] Date Initiated: 03/10/2018 Target Resolution Date: 03/22/2018 Goal Status: Active Interventions: Provide education on orientation to the wound center Notes: ` Venous Leg Ulcer Nursing Diagnoses: Knowledge deficit related to disease process and management Goals: Patient will maintain optimal edema control Date Initiated: 03/10/2018 Target Resolution Date: 03/26/2018 [name], [name]. ([id number]) Goal Status: Active Interventions: Assess peripheral edema status every visit. Notes: ` Wound/Skin Impairment Nursing Diagnoses: Impaired tissue integrity Goals:  Ulcer/skin breakdown will have a volume reduction of 50% by week 8 Date Initiated: 03/10/2018 Target Resolution Date: 05/10/2018 Goal Status: Active Interventions: Assess ulceration(s) every visit Treatment Activities: Skin care regimen initiated : 03/10/2018 Topical wound management initiated : 03/10/2018 Notes: Electronic Signature(s) Signed: 05/24/2018 2:40:40 PM By: Gretta Cool, BSN, RN, CWS, Kim RN, BSN Entered By: Gretta Cool, BSN, RN, CWS, Kim on 05/24/2018 14:40:36 [name], [name]  ([id number]) -------------------------------------------------------------------------------- Pain Assessment Details [name]. Date of Service: 05/19/2018 8:00 AM Medical Record Number: [id number] Patient Account Number: [id number] Date of Birth/Sex: 02-May-1934 (84 y.o. F) Treating RN: Cornell Barman Primary Care Destan Franchini: Loura Pardon Other Clinician: Referring Treonna Klee: Loura Pardon Treating Corinna Burkman/Extender: Tito Dine in Treatment: 10 Active Problems Location of Pain Severity and Description of Pain Patient Has Paino No Site Locations Pain Management and Medication Current Pain Management: Electronic Signature(s) Signed: 05/24/2018 2:32:51 PM By: Gretta Cool, BSN, RN, CWS, Kim RN, BSN Entered By: Gretta Cool, BSN, RN, CWS, Kim on 05/24/2018 14:32:50 [name] ([id number]) -------------------------------------------------------------------------------- Patient/Caregiver Education Details [name]. Date of Service: 05/19/2018 8:00 AM Medical Record Number: [id number] Patient Account Number: [id number] Date of Birth/Gender: 1934-03-20 (84 y.o. F) Treating RN: Cornell Barman Primary Care Physician: Loura Pardon Other Clinician: Referring Physician: Loura Pardon Treating Physician/Extender: Tito Dine in Treatment: 10 Education Assessment Education Provided To: Patient Education Topics Provided Wound/Skin Impairment: Handouts: Caring for Your Ulcer, Other: Continue wound care as prescribed Methods: Explain/Verbal Responses: State content correctly Electronic Signature(s) Signed: 05/28/2018 6:17:48 PM By: Gretta Cool, BSN, RN, CWS, Kim RN, BSN Entered By: Gretta Cool, BSN, RN, CWS, Kim on 05/24/2018 14:48:07 [name] ([id number]) -------------------------------------------------------------------------------- Wound Assessment Details [name]. Date of Service: 05/19/2018 8:00 AM Medical Record Number:  [id number] Patient Account Number: [id number] Date of Birth/Sex: 10/23/1934 (84 y.o. F) Treating RN: Cornell Barman Primary Care Yigit Norkus: Loura Pardon Other Clinician: Referring Frisco Cordts: Loura Pardon Treating Yelina Sarratt/Extender: Tito Dine in Treatment: 10 Wound Status Wound Number: 6 Primary Lesion Etiology: Wound Location: Left Knee Wound Status: Open Wounding Event: Gradually Appeared Comorbid Cataracts, Sleep Apnea, Arrhythmia, Date Acquired: 02/24/2018 History: Hypertension, Scleroderma Weeks Of Treatment: 10 Clustered Wound: No Photos Photo Uploaded By: Sharon Mt on 05/25/2018 13:14:26 Wound Measurements Length: (cm) 1.8 Width: (cm) 0.8 Depth: (cm) 0.1 Area: (cm) 1.131 Volume: (cm) 0.113 % Reduction in Area: -50% % Reduction in Volume: -50.7% Epithelialization: None Undermining: No Wound Description Classification: Partial Thickness Wound Margin: Flat and Intact Exudate Amount: Medium Exudate Type: Serosanguineous Exudate Color: red, brown Foul Odor After Cleansing: No Slough/Fibrino No Wound Bed Granulation Amount: Large (67-100%) Exposed Structure Granulation Quality: Red Fascia Exposed: No Necrotic Amount: Small (1-33%) Fat Layer (Subcutaneous Tissue) Exposed: No Necrotic Quality: Eschar, Adherent Slough Tendon Exposed: No Muscle Exposed: No Joint Exposed: No Bone Exposed: No Periwound Skin Texture HOLLEE, FATE ([id number]) Texture Color No Abnormalities Noted: No No Abnormalities Noted: No Moisture No Abnormalities Noted: No Electronic Signature(s) Signed: 05/24/2018 2:36:52 PM By: Gretta Cool, BSN, RN, CWS, Kim RN, BSN Entered By: Gretta Cool, BSN, RN, CWS, Kim on 05/24/2018 14:36:51 [name], [name] ([id number]) -------------------------------------------------------------------------------- Wound Assessment Details [name]. Date of Service: 05/19/2018 8:00 AM Medical Record Number: [id number] Patient Account Number:  [id number] Date of Birth/Sex: Oct 03, 1934 (84 y.o. F) Treating RN: Cornell Barman Primary Care Generoso Cropper: Loura Pardon Other Clinician: Referring Charma Mocarski: Loura Pardon Treating Ornella Coderre/Extender: Tito Dine in Treatment: 10 Wound Status Wound Number: 8 Primary Infection - not  elsewhere classified Etiology: Wound Location: Left Knee - Posterior Wound Status: Open Wounding Event: Surgical Injury Comorbid Cataracts, Sleep Apnea, Arrhythmia, Date Acquired: 02/24/2018 History: Hypertension, Scleroderma Weeks Of Treatment: 10 Clustered Wound: No Wound under treatment by Quantae Martel outside of [place] Uploaded By: Sharon Mt on 05/25/2018 13:13:50 Wound Measurements Length: (cm) 0.2 Width: (cm) 0.2 Depth: (cm) 0.5 Area: (cm) 0.031 Volume: (cm) 0.016 % Reduction in Area: 56.3% % Reduction in Volume: -14.3% Epithelialization: None Tunneling: No Undermining: No Wound Description Classification: Partial Thickness Wound Margin: Flat and Intact Exudate Amount: Large Exudate Type: Serosanguineous Exudate Color: red, brown Foul Odor After Cleansing: No Slough/Fibrino No Periwound Skin Texture Texture Color No Abnormalities Noted: No No Abnormalities Noted: No Moisture No Abnormalities Noted: No Electronic Signature(s) Signed: 05/24/2018 2:37:31 PM By: Gretta Cool, BSN, RN, CWS, Kim RN, BSN [name], [name] ([id number]) Entered By: Gretta Cool, BSN, RN, CWS, Kim on 05/24/2018 14:37:30 [name], [name] ([id number]) -------------------------------------------------------------------------------- Vitals Details [name]. Date of Service: 05/19/2018 8:00 AM Medical Record Number: [id number] Patient Account Number: [id number] Date of Birth/Sex: [birth year] (84 y.o. F) Treating RN: Cornell Barman Primary Care Carloyn Lahue: Loura Pardon Other Clinician: Referring Adenike Shidler: Loura Pardon Treating Keriana Sarsfield/Extender: Tito Dine in Treatment: 10 Vital  Signs Time Taken: 08:14 Temperature (F): 97.6 Height (in): 66 Pulse (bpm): 63 Respiratory Rate (breaths/min): 16 Blood Pressure (mmHg): 133/69 Reference Range: 80 - 120 mg / dl Electronic Signature(s) Signed: 05/24/2018 2:33:27 PM By: Gretta Cool, BSN, RN, CWS, Kim RN, BSN Entered By: Gretta Cool, BSN, RN, CWS, Kim on 05/24/2018 14:33:26

## 2018-06-02 ENCOUNTER — Ambulatory Visit (INDEPENDENT_AMBULATORY_CARE_PROVIDER_SITE_OTHER): Payer: Medicare Other | Admitting: Family Medicine

## 2018-06-02 ENCOUNTER — Encounter: Payer: Self-pay | Admitting: Family Medicine

## 2018-06-02 VITALS — BP 118/70 | HR 72 | Temp 97.9°F | Ht 66.0 in | Wt 212.2 lb

## 2018-06-02 DIAGNOSIS — R350 Frequency of micturition: Secondary | ICD-10-CM | POA: Diagnosis not present

## 2018-06-02 DIAGNOSIS — F411 Generalized anxiety disorder: Secondary | ICD-10-CM

## 2018-06-02 LAB — POC URINALSYSI DIPSTICK (AUTOMATED)
Bilirubin, UA: NEGATIVE
GLUCOSE UA: NEGATIVE
KETONES UA: NEGATIVE
Leukocytes, UA: NEGATIVE
Nitrite, UA: NEGATIVE
Protein, UA: NEGATIVE
RBC UA: NEGATIVE
SPEC GRAV UA: 1.025 (ref 1.010–1.025)
UROBILINOGEN UA: 0.2 U/dL
pH, UA: 6 (ref 5.0–8.0)

## 2018-06-02 MED ORDER — BUSPIRONE HCL 15 MG PO TABS: 7.5000 mg | ORAL_TABLET | Freq: Two times a day (BID) | ORAL | 11 refills | Status: DC

## 2018-06-02 NOTE — Progress Notes (Signed)
Subjective:    Patient ID: [name], female    DOB: [birth year], 84 y.o.   MRN: [id number]  HPI  Here for f/u for symptoms of anxiety and also urinary symptoms   Wt Readings from Last 3 Encounters:  06/02/18 212 lb 4 oz (96.3 kg)  04/07/18 212 lb 4 oz (96.3 kg)  02/08/18 211 lb 9.6 oz (96 kg)  stable  34.26 kg/m   Takes prozac 40 mg daily  Inc this in June from 20 mg  Has had counseling   Anxiety has increased  It initially improved after the inc in prozac and then worse again   Gets very upset/anxious about going to PT  One episode got upset when she needed to go the the bathroom  Having panic attacks  They tend to last about 10 minutes  Never had these before or when younger   Daughter is home all the time (lost her job)-this may be stressful to her  PT is very hard on her / is painful -but she makes the effort  Sleep is ok- uses melatonin  occ trouble concentrating   Frustrating- cannot zero in on what she is worried or upset about  Short term memory may be problematic also   Not tearful No anhedonia  Really enjoys seeing great grandchild  Also had event at church with bingo   Urinary symptoms - frequency  Unusual urine odor  Making effort to drink water (some soda and tea)  No burning  Clear UA today  Currently takes cephalexin for wound care 500 mg tid   Results for orders placed or performed in visit on 06/02/18  POCT Urinalysis Dipstick (Automated)  Result Value Ref Range   Color, UA Yellow    Clarity, UA Clear    Glucose, UA Negative Negative   Bilirubin, UA Negative    Ketones, UA Negative    Spec Grav, UA 1.025 1.010 - 1.025   Blood, UA Negative    pH, UA 6.0 5.0 - 8.0   Protein, UA Negative Negative   Urobilinogen, UA 0.2 0.2 or 1.0 E.U./dL   Nitrite, UA Negative    Leukocytes, UA Negative Negative    Patient Active Problem List   Diagnosis Date Noted  . Left knee pain 04/07/2018  . Cor pulmonale (chronic) ([place]) 02/08/2018  . ILD  (interstitial lung disease) ([place]) 10/20/2017  . Hypothyroid 07/03/2017  . B12 deficiency 07/03/2017  . Encounter for audiology evaluation 07/03/2017  . Encounter for screening mammogram for breast cancer 07/03/2017  . Tearfulness 06/28/2017  . Memory loss 06/28/2017  . Poor balance 09/26/2016  . Neck pain 07/30/2016  . Diarrhea 07/30/2016  . Routine general medical examination at a health care facility 06/26/2016  . Elevated TSH 06/25/2016  . Generalized anxiety disorder 06/04/2016  . Intertrigo 06/04/2016  . Chronic cough 05/01/2016  . Constipation 01/09/2016  . Umbilical hernia 67/61/9509  . Chronic respiratory failure ([place]) 10/05/2015  . Thromboembolism of vein 10/02/2015  . Infection of prosthetic joint ([place]) 09/18/2015  . Infection of prosthetic left knee joint ([place]) 09/12/2015  . Effusion of knee 08/30/2015  . Pulmonary hypertension ([place])   . Physical deconditioning 02/25/2015  . RBBB-new 02/12/2015  . History of bilat PE after TKR (off Coumadin Dec 2015) 02/12/2015  . Abnormal EKG-TWI, RBBB, prolonged QT   . Hernia, umbilical 32/67/1245  . Hx of colonic polyps 10/24/2014  . Diverticulosis of colon without hemorrhage   . OSA (obstructive sleep apnea) 07/22/2014  .  Unspecified arthropathy, lower leg 03/21/2014  . Status post surgery 03/15/2014  . Arthritis of knee 03/09/2014  . Solitary pulmonary nodule 05/03/2013  . Sternal fracture 04/26/2013  . History of DVT (deep vein thrombosis) 11/24/2012  . History of artificial joint 10/29/2012  . 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 Complications due to internal joint prosthesis ([place]) 09/07/2012  . PES PLANUS 08/15/2010  . URINARY FREQUENCY, CHRONIC 07/14/2008  . Dyslipidemia 07/09/2007  . Essential hypertension 07/09/2007  . PSVT with SSS 02/23/2007  . DEGENERATIVE JOINT DISEASE 02/23/2007  . SPINAL STENOSIS, LUMBAR 02/23/2007  . History of osteopenia 02/23/2007  . SPONDYLOLISTHESIS 02/23/2007   Past Medical History:    Diagnosis Date  . Anemia   . Arthritis    knee  . Diverticulosis   . Hx of colonic polyps 10/24/2014  . Hyperlipidemia   . Hypertension   . Osteopenia   . Overactive bladder    urge incontinence  . PSVT (paroxysmal supraventricular tachycardia) ([place])   . Pulmonary emboli ([place])   . Pulmonary hypertension ([place])   . Right ventricular failure ([place])   . Sleep apnea    CPAP  . Spinal stenosis    Past Surgical History:  Procedure Laterality Date  . BLADDER SUSPENSION  1993  . CARDIAC CATHETERIZATION N/A 02/28/2015   Procedure: Right/Left Heart Cath and Coronary Angiography;  Surgeon: Troy Sine, MD;  Location: [facility]ASIVE [facility] CUPID;  Service: Cardiovascular;  Laterality: N/A;  . CATARACT EXTRACTION Bilateral 2008  . CHOLECYSTECTOMY    . COLONOSCOPY    . COLONOSCOPY N/A 10/24/2014   Procedure: COLONOSCOPY;  Surgeon: Gatha Mayer, MD;  Location: [facility] ENDOSCOPY;  Service: Endoscopy;  Laterality: N/A;  . CYST REMOVAL LEG Left 02/15/2016   cyst removal from back of left knee  . ELECTROPHYSIOLOGIC STUDY N/A 03/02/2015   Procedure: SVT Ablation;  Surgeon: Evans Lance, MD;  Location: [facility] CV [facility];  Service: Cardiovascular;  Laterality: N/A;  . INCISIONAL HERNIA REPAIR    . RIGHT HEART CATHETERIZATION N/A 07/04/2014   Procedure: RIGHT HEART CATH;  Surgeon: Jolaine Artist, MD;  Location: [facility] CATH LAB;  Service: Cardiovascular;  Laterality: N/A;  . TONSILLECTOMY AND ADENOIDECTOMY    . TOTAL KNEE ARTHROPLASTY Left 2000  . TOTAL KNEE ARTHROPLASTY Left 10/29/2012   at [facility]  . TOTAL KNEE ARTHROPLASTY Right 03/09/2014  . TOTAL KNEE REVISION  01/12/2012   Procedure: TOTAL KNEE REVISION;  Surgeon: Kerin Salen, MD;  Location: [facility];  Service: Orthopedics;  Laterality: Left;  DEPUY/ LCS , HAND SET  . TRIGGER FINGER RELEASE  01/12/2012   Procedure: RELEASE TRIGGER FINGER/A-1 PULLEY;  Surgeon: Kerin Salen, MD;  Location: [facility];  Service: Orthopedics;  Laterality: Right;   Social  History   Tobacco Use  . Smoking status: Never Smoker  . Smokeless tobacco: Never Used  Substance Use Topics  . Alcohol use: No    Alcohol/week: 0.0 standard drinks  . Drug use: No   Family History  Problem Relation Age of Onset  . Breast cancer Mother 64  . Heart disease Father   . Diabetes Father   . Hypertension Father   . Uterine cancer Sister    Allergies  Allergen Reactions  . Prednisone Other (See Comments)    hyperctivity  . Zithromax [Azithromycin] Other (See Comments)    Prolonged QT on EKG  . Penicillins Hives and Swelling    Questionable allergy (?) Currently on cefazolin, tolerating well since 4/21 Patient is  tolerating dicloxacillin without issue. No skin changes or other concerns.   . Sulfonamide Derivatives Hives  . Adhesive [Tape] Other (See Comments) and Rash    redness   Current Outpatient Medications on File Prior to Visit  Medication Sig Dispense Refill  . benzonatate (TESSALON) 200 MG capsule Take 1 capsule (200 mg total) by mouth 2 (two) times daily as needed for cough. 180 capsule 3  . Calcium Carb-Cholecalciferol (CALCIUM 600/VITAMIN D3) 600-800 MG-UNIT TABS Take 1 tablet by mouth 2 (two) times daily.    . cephALEXin (KEFLEX) 500 MG capsule 500 mg 3 (three) times daily.     . Cholecalciferol (VITAMIN D-1000 MAX ST) 1000 UNITS tablet Take 1,000 Units by mouth daily.    Marland Kitchen FLUoxetine (PROZAC) 40 MG capsule Take 1 capsule (40 mg total) by mouth daily. 30 capsule 11  . hydrochlorothiazide (HYDRODIURIL) 25 MG tablet TAKE 1 TABLET DAILY 90 tablet 3  . levothyroxine (SYNTHROID, LEVOTHROID) 25 MCG tablet Take 1 tablet (25 mcg total) by mouth daily before breakfast. 30 tablet 11  . metoprolol tartrate (LOPRESSOR) 25 MG tablet TAKE ONE-HALF (1/2) OF A TABLET BY MOUTHTWICE DAILY *PT DUE FOR APPT,MUST CALL TO SCHEDULE [phone]!* 15 tablet 0  . naproxen sodium (ANAPROX) 220 MG tablet Take 220 mg by mouth daily.     . OXYGEN Inhale 2 L into the lungs at  bedtime.    Vladimir Faster Glycol-Propyl Glycol (SYSTANE) 0.4-0.3 % SOLN Apply 1 drop to eye 2 (two) times daily.     No current facility-administered medications on file prior to visit.       Review of Systems  Constitutional: Negative for activity change, appetite change, fatigue, fever and unexpected weight change.  HENT: Negative for congestion, ear pain, rhinorrhea, sinus pressure and sore throat.   Eyes: Negative for pain, redness and visual disturbance.  Respiratory: Negative for cough, shortness of breath and wheezing.   Cardiovascular: Negative for chest pain and palpitations.  Gastrointestinal: Negative for abdominal pain, blood in stool, constipation and diarrhea.  Endocrine: Negative for polydipsia and polyuria.  Genitourinary: Negative for dysuria, frequency and urgency.  Musculoskeletal: Positive for arthralgias. Negative for back pain and myalgias.  Skin: Positive for wound. Negative for pallor and rash.       Chronic wound/knee-on daily abx  Allergic/Immunologic: Negative for environmental allergies.  Neurological: Negative for dizziness, syncope and headaches.  Hematological: Negative for adenopathy. Does not bruise/bleed easily.  Psychiatric/Behavioral: Positive for dysphoric mood. Negative for decreased concentration, sleep disturbance and suicidal ideas. The patient is nervous/anxious.        Objective:   Physical Exam  Constitutional: She appears well-developed and well-nourished. No distress.  overwt and well appearing elderly female   HENT:  Head: Normocephalic and atraumatic.  Mouth/Throat: Oropharynx is clear and moist.  Eyes: Pupils are equal, round, and reactive to light. Conjunctivae and EOM are normal.  Neck: Normal range of motion. Neck supple. No JVD present. Carotid bruit is not present. No thyromegaly present.  Cardiovascular: Normal rate, regular rhythm, normal heart sounds and intact distal pulses. Exam reveals no gallop.  Pulmonary/Chest: Effort  normal and breath sounds normal. No respiratory distress. She has no wheezes. She has no rales.  No crackles  Abdominal: Soft. Bowel sounds are normal. She exhibits no distension, no abdominal bruit and no mass. There is no tenderness.  No suprapubic tenderness or fullness  No cva tenderness   Musculoskeletal: She exhibits no edema.  Lymphadenopathy:    She has no cervical adenopathy.  Neurological: She is alert. She has normal reflexes. She displays normal reflexes. No cranial nerve deficit. Coordination normal.  Mobility impaired   Skin: Skin is warm and dry. No rash noted. No erythema.  Baseline knee wound  Psychiatric: Her mood appears anxious. Her affect is not blunt, not labile and not inappropriate. She is slowed. Cognition and memory are normal. She exhibits a depressed mood.  Anxious and slow to respond at times  Talks candidly about her symptoms           Assessment & Plan:   Problem List Items Addressed This Visit      Other   Generalized anxiety disorder - Primary    Currently on prozac 40 mg daily  Having more anxiety attacks as well as generalized anxiety Not interested in counseling  Reviewed stressors/ coping techniques/symptoms/ support sources/ tx options and side effects in detail today - she does not site a specific stressor  Disc opt for tx  Will add buspar 7.5 mg bid (can titrate up if successful)  Discussed expectations of this medication including time to effectiveness and mechanism of action, also poss of side effects (early and late)- including mental fuzziness, weight or appetite change, nausea and poss of worse dep or anxiety (even suicidal thoughts)  Pt voiced understanding and will stop med and update if this occurs  >25 minutes spent in face to face time with patient, >50% spent in counselling or coordination of care - including review of importance of self care as well as exercise at tolerated        Relevant Medications   busPIRone (BUSPAR) 15 MG  tablet   URINARY FREQUENCY, CHRONIC    UA is clear today  Suspect some element of overactive bladder with urgency  Anxiety may also fuel this   Will continue to follow       Relevant Orders   POCT Urinalysis Dipstick (Automated) (Completed)

## 2018-06-02 NOTE — Patient Instructions (Signed)
Continue the fluoxetine (prozac) Add buspar 1/2 pill twice daily    (7.5 mg twice daily)  Update me in the next 2 weeks -re: how it is working We can titrate the dose  If you feel worse or have side effects please let me know   Stay active Take care of yourself  Try to get enough sleep  Also enough fluids  UA is normal /negative today

## 2018-06-04 ENCOUNTER — Emergency Department: Payer: Medicare Other

## 2018-06-04 ENCOUNTER — Encounter: Payer: Self-pay | Admitting: Emergency Medicine

## 2018-06-04 ENCOUNTER — Emergency Department
Admission: EM | Admit: 2018-06-04 | Discharge: 2018-06-04 | Disposition: A | Discharge status: Home / Self Care | Payer: Medicare Other | Attending: Emergency Medicine | Admitting: Emergency Medicine

## 2018-06-04 ENCOUNTER — Other Ambulatory Visit: Payer: Self-pay

## 2018-06-04 DIAGNOSIS — Z86718 Personal history of other venous thrombosis and embolism: Secondary | ICD-10-CM | POA: Diagnosis not present

## 2018-06-04 DIAGNOSIS — I10 Essential (primary) hypertension: Secondary | ICD-10-CM | POA: Diagnosis not present

## 2018-06-04 DIAGNOSIS — Z79899 Other long term (current) drug therapy: Secondary | ICD-10-CM | POA: Diagnosis not present

## 2018-06-04 DIAGNOSIS — Y92002 Bathroom of unspecified non-institutional (private) residence single-family (private) house as the place of occurrence of the external cause: Secondary | ICD-10-CM | POA: Diagnosis not present

## 2018-06-04 DIAGNOSIS — S79921A Unspecified injury of right thigh, initial encounter: Secondary | ICD-10-CM | POA: Diagnosis present

## 2018-06-04 DIAGNOSIS — S0990XA Unspecified injury of head, initial encounter: Secondary | ICD-10-CM | POA: Insufficient documentation

## 2018-06-04 DIAGNOSIS — Z86711 Personal history of pulmonary embolism: Secondary | ICD-10-CM | POA: Insufficient documentation

## 2018-06-04 DIAGNOSIS — Y92009 Unspecified place in unspecified non-institutional (private) residence as the place of occurrence of the external cause: Secondary | ICD-10-CM

## 2018-06-04 DIAGNOSIS — W010XXA Fall on same level from slipping, tripping and stumbling without subsequent striking against object, initial encounter: Secondary | ICD-10-CM | POA: Diagnosis not present

## 2018-06-04 DIAGNOSIS — Z96653 Presence of artificial knee joint, bilateral: Secondary | ICD-10-CM | POA: Diagnosis not present

## 2018-06-04 DIAGNOSIS — S72114A Nondisplaced fracture of greater trochanter of right femur, initial encounter for closed fracture: Secondary | ICD-10-CM | POA: Diagnosis not present

## 2018-06-04 DIAGNOSIS — E039 Hypothyroidism, unspecified: Secondary | ICD-10-CM | POA: Insufficient documentation

## 2018-06-04 DIAGNOSIS — Y9301 Activity, walking, marching and hiking: Secondary | ICD-10-CM | POA: Diagnosis not present

## 2018-06-04 DIAGNOSIS — Y999 Unspecified external cause status: Secondary | ICD-10-CM | POA: Diagnosis not present

## 2018-06-04 DIAGNOSIS — S2241XA Multiple fractures of ribs, right side, initial encounter for closed fracture: Secondary | ICD-10-CM

## 2018-06-04 DIAGNOSIS — W19XXXA Unspecified fall, initial encounter: Secondary | ICD-10-CM

## 2018-06-04 MED ORDER — HYDROCODONE-ACETAMINOPHEN 5-325 MG PO TABS
1.0000 | ORAL_TABLET | Freq: Once | ORAL | Status: AC
  Administered 2018-06-04: 1 via ORAL
  Filled 2018-06-04: qty 1

## 2018-06-04 MED ORDER — HYDROCODONE-ACETAMINOPHEN 5-325 MG PO TABS: 1.0000 | ORAL_TABLET | ORAL | 0 refills | Status: DC | PRN

## 2018-06-04 NOTE — ED Provider Notes (Signed)
[facility] Emergency Department Provider Note  ____________________________________________  Time seen: Approximately 9:12 PM  I have reviewed the triage vital signs and the nursing notes.   HISTORY  Chief Complaint Fall    HPI [name] is a 84 y.o. female presents the emergency department via EMS after mechanical fall.  She reports that she was in the bathroom, tripped over a rug, falling onto the right hip/side.  Patient reports that she did hit her head but did not lose consciousness.  She is complaining of a mild headache, neck pain, right rib and right hip pain.  Patient denies any visual changes, numbness and tingling in the upper or lower extremities, back pain, bowel or bladder dysfunction, saddle anesthesia, paresthesias.  She denies any difficulty breathing, shortness of breath or cough.  Patient does not use blood thinners.  Patient did not receive any medications for this complaint prior to arrival.  Patient has a history of arthritis, anemia, hypertension, osteopenia, paroxysmal SVT, PE, sleep apnea, spinal stenosis.  No complaints of chronic medical problems.    Past Medical History:  Diagnosis Date  . Anemia   . Arthritis    knee  . Diverticulosis   . Hx of colonic polyps 10/24/2014  . Hyperlipidemia   . Hypertension   . Osteopenia   . Overactive bladder    urge incontinence  . PSVT (paroxysmal supraventricular tachycardia) ([place])   . Pulmonary emboli ([place])   . Pulmonary hypertension ([place])   . Right ventricular failure ([place])   . Sleep apnea    CPAP  . Spinal stenosis     Patient Active Problem List   Diagnosis Date Noted  . Left knee pain 04/07/2018  . Cor pulmonale (chronic) ([place]) 02/08/2018  . ILD (interstitial lung disease) ([place]) 10/20/2017  . Hypothyroid 07/03/2017  . B12 deficiency 07/03/2017  . Encounter for audiology evaluation 07/03/2017  . Encounter for screening mammogram for breast cancer 07/03/2017  .  Tearfulness 06/28/2017  . Memory loss 06/28/2017  . Poor balance 09/26/2016  . Neck pain 07/30/2016  . Diarrhea 07/30/2016  . Routine general medical examination at a health care facility 06/26/2016  . Elevated TSH 06/25/2016  . Generalized anxiety disorder 06/04/2016  . Intertrigo 06/04/2016  . Chronic cough 05/01/2016  . Constipation 01/09/2016  . Umbilical hernia 86[birth year]1950  . Chronic respiratory failure ([place]) 10/05/2015  . Thromboembolism of vein 10/02/2015  . Infection of prosthetic joint ([place]) 09/18/2015  . Infection of prosthetic left knee joint ([place]) 09/12/2015  . Effusion of knee 08/30/2015  . Pulmonary hypertension ([place])   . Physical deconditioning 02/25/2015  . RBBB-new 02/12/2015  . History of bilat PE after TKR (off Coumadin Dec 2015) 02/12/2015  . Abnormal EKG-TWI, RBBB, prolonged QT   . Hernia, umbilical 93/26/7124  . Hx of colonic polyps 10/24/2014  . Diverticulosis of colon without hemorrhage   . OSA (obstructive sleep apnea) 07/22/2014  . Unspecified arthropathy, lower leg 03/21/2014  . Status post surgery 03/15/2014  . Arthritis of knee 03/09/2014  . Solitary pulmonary nodule 05/03/2013  . Sternal fracture 04/26/2013  . History of DVT (deep vein thrombosis) 11/24/2012  . History of artificial joint 10/29/2012  . 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 Complications due to internal joint prosthesis ([place]) 09/07/2012  . PES PLANUS 08/15/2010  . URINARY FREQUENCY, CHRONIC 07/14/2008  . Dyslipidemia 07/09/2007  . Essential hypertension 07/09/2007  . PSVT with SSS 02/23/2007  . DEGENERATIVE JOINT DISEASE 02/23/2007  . SPINAL STENOSIS, LUMBAR 02/23/2007  .  History of osteopenia 02/23/2007  . SPONDYLOLISTHESIS 02/23/2007    Past Surgical History:  Procedure Laterality Date  . BLADDER SUSPENSION  1993  . CARDIAC CATHETERIZATION N/A 02/28/2015   Procedure: Right/Left Heart Cath and Coronary Angiography;  Surgeon: Troy Sine, MD;  Location: [place] INVASIVE CV LAB  CUPID;  Service: Cardiovascular;  Laterality: N/A;  . CATARACT EXTRACTION Bilateral 2008  . CHOLECYSTECTOMY    . COLONOSCOPY    . COLONOSCOPY N/A 10/24/2014   Procedure: COLONOSCOPY;  Surgeon: Gatha Mayer, MD;  Location: [facility] ENDOSCOPY;  Service: Endoscopy;  Laterality: N/A;  . CYST REMOVAL LEG Left 02/15/2016   cyst removal from back of left knee  . ELECTROPHYSIOLOGIC STUDY N/A 03/02/2015   Procedure: SVT Ablation;  Surgeon: Evans Lance, MD;  Location: [place] CV LAB;  Service: Cardiovascular;  Laterality: N/A;  . INCISIONAL HERNIA REPAIR    . RIGHT HEART CATHETERIZATION N/A 07/04/2014   Procedure: RIGHT HEART CATH;  Surgeon: Jolaine Artist, MD;  Location: [facility] CATH LAB;  Service: Cardiovascular;  Laterality: N/A;  . TONSILLECTOMY AND ADENOIDECTOMY    . TOTAL KNEE ARTHROPLASTY Left 2000  . TOTAL KNEE ARTHROPLASTY Left 10/29/2012   at [facility]  . TOTAL KNEE ARTHROPLASTY Right 03/09/2014  . TOTAL KNEE REVISION  01/12/2012   Procedure: TOTAL KNEE REVISION;  Surgeon: Kerin Salen, MD;  Location: [place];  Service: Orthopedics;  Laterality: Left;  DEPUY/ LCS , HAND SET  . TRIGGER FINGER RELEASE  01/12/2012   Procedure: RELEASE TRIGGER FINGER/A-1 PULLEY;  Surgeon: Kerin Salen, MD;  Location: [place];  Service: Orthopedics;  Laterality: Right;    Prior to Admission medications   Medication Sig Start Date End Date Taking? Authorizing Provider  benzonatate (TESSALON) 200 MG capsule Take 1 capsule (200 mg total) by mouth 2 (two) times daily as needed for cough. 11/24/17   Rigoberto Noel, MD  busPIRone (BUSPAR) 15 MG tablet Take 0.5 tablets (7.5 mg total) by mouth 2 (two) times daily. 06/02/18   Tower, Wynelle Fanny, MD  Calcium Carb-Cholecalciferol (CALCIUM 600/VITAMIN D3) 600-800 MG-UNIT TABS Take 1 tablet by mouth 2 (two) times daily.    [provider]  cephALEXin (KEFLEX) 500 MG capsule 500 mg 3 (three) times daily.  06/12/17   [provider]  Cholecalciferol (VITAMIN D-1000 MAX ST)  1000 UNITS tablet Take 1,000 Units by mouth daily. 09/08/15   [provider]  FLUoxetine (PROZAC) 40 MG capsule Take 1 capsule (40 mg total) by mouth daily. 04/07/18   Tower, Wynelle Fanny, MD  hydrochlorothiazide (HYDRODIURIL) 25 MG tablet TAKE 1 TABLET DAILY 07/09/17   Tower, Wynelle Fanny, MD  HYDROcodone-acetaminophen (NORCO/VICODIN) 5-325 MG tablet Take 1 tablet by mouth every 4 (four) hours as needed for moderate pain. 06/04/18   Cuthriell, Charline Bills, PA-C  levothyroxine (SYNTHROID, LEVOTHROID) 25 MCG tablet Take 1 tablet (25 mcg total) by mouth daily before breakfast. 10/19/17   Tower, Wynelle Fanny, MD  metoprolol tartrate (LOPRESSOR) 25 MG tablet TAKE ONE-HALF (1/2) OF A TABLET BY MOUTHTWICE DAILY *PT DUE FOR APPT,MUST CALL TO SCHEDULE [phone]!* 04/08/17   Allred, Jeneen Rinks, MD  naproxen sodium (ANAPROX) 220 MG tablet Take 220 mg by mouth daily.     [provider]  OXYGEN Inhale 2 L into the lungs at bedtime.    [provider]  Polyethyl Glycol-Propyl Glycol (SYSTANE) 0.4-0.3 % SOLN Apply 1 drop to eye 2 (two) times daily.    [provider]    Allergies  Prednisone; Zithromax [azithromycin]; Penicillins; Sulfonamide derivatives; and Adhesive [tape]  Family History  Problem Relation Age of Onset  . Breast cancer Mother 61  . Heart disease Father   . Diabetes Father   . Hypertension Father   . Uterine cancer Sister     Social History Social History   Tobacco Use  . Smoking status: Never Smoker  . Smokeless tobacco: Never Used  Substance Use Topics  . Alcohol use: No    Alcohol/week: 0.0 standard drinks  . Drug use: No     Review of Systems  Constitutional: No fever/chills Eyes: No visual changes. No discharge ENT: No upper respiratory complaints. Cardiovascular: no chest pain. Respiratory: no cough. No SOB. Gastrointestinal: No abdominal pain.  No nausea, no vomiting.  No diarrhea.  No constipation. Genitourinary: Negative for dysuria. No  hematuria Musculoskeletal: Positive for right rib pain, right hip pain. Skin: Negative for rash, abrasions, lacerations, ecchymosis. Neurological: Patient endorses mild headache but denies focal weakness or numbness. 10-point ROS otherwise negative.  ____________________________________________   PHYSICAL EXAM:  VITAL SIGNS: ED Triage Vitals [06/04/18 2038]  Enc Vitals Group     BP (!) 149/69     Pulse Rate 72     Resp 18     Temp 98.1 F (36.7 C)     Temp Source Oral     SpO2 95 %     Weight 210 lb (95.3 kg)     Height 5\' 6"  (1.676 m)     Head Circumference      Peak Flow      Pain Score 4     Pain Loc      Pain Edu?      Excl. in [place]?      Constitutional: Alert and oriented. Well appearing and in no acute distress. Eyes: Conjunctivae are normal. PERRL. EOMI. Head: Atraumatic.  No visible signs of trauma with ecchymosis, lacerations or abrasions.  Patient is nontender to palpation over the osseous structures of the skull and face.  No battle signs, raccoon eyes, serosanguineous fluid drainage from the ears or nares. ENT:      Ears:       Nose: No congestion/rhinnorhea.      Mouth/Throat: Mucous membranes are moist.  Neck: No stridor.  No cervical spine tenderness to palpation.  Cardiovascular: Normal rate, regular rhythm. Normal S1 and S2.  Good peripheral circulation. Respiratory: Normal respiratory effort without tachypnea or retractions. Lungs CTAB. Good air entry to the bases with no decreased or absent breath sounds. Gastrointestinal: Bowel sounds 4 quadrants. Soft and nontender to palpation. No guarding or rigidity. No palpable masses. No distention. No CVA tenderness. Musculoskeletal: Full range of motion to all extremities. No gross deformities appreciated.  Visualization of the right ribs reveals no gross deformity, no paradoxical chest wall movement.  Patient is diffusely tender to palpation in the ribs 8 through 12 with no palpable abnormality or deficit.  Good  underlying breath sounds bilaterally.  Visualization of the right hip reveals no gross deformity, edema, ecchymosis.  No shortening or rotation of the right leg.  Patient is tender to palpation over the greater trochanteric region as well as the posterior hip and groin region.  No palpable abnormality.  No range of motion testing at this time.  Examination of the right knee, right ankle is unremarkable.  Dorsalis pedis pulse intact bilateral lower extremities.  Sensation intact and equal bilateral lower extremities. Neurologic:  Normal speech and language. No gross focal neurologic deficits are  appreciated.  Cranial nerves II through XII grossly intact. Skin:  Skin is warm, dry and intact. No rash noted. Psychiatric: Mood and affect are normal. Speech and behavior are normal. Patient exhibits appropriate insight and judgement.   ____________________________________________   LABS (all labs ordered are listed, but only abnormal results are displayed)  Labs Reviewed - No data to display ____________________________________________  EKG   ____________________________________________  RADIOLOGY I personally viewed and evaluated these images as part of my medical decision making, as well as reviewing the written report by the radiologist.  Dg Ribs Unilateral W/chest Right  Result Date: 06/04/2018 CLINICAL DATA:  Mechanical fall with right rib and hip pain. EXAM: RIGHT RIBS AND CHEST - 3+ VIEW COMPARISON:  CXR 05/01/2016 FINDINGS: Cardiomegaly with atherosclerotic ectatic thoracic aorta. Central pulmonary vascular redistribution and congestion consistent with CHF. Subpleural atelectasis and/or fibrosis is identified at the lung bases, left greater than right. Minimally displaced acute right anterolateral ninth, tenth and eleventh rib fractures are identified. No pneumothorax or hemothorax. IMPRESSION: 1. Acute minimally displaced right anterolateral ninth, tenth and eleventh rib fractures without  pneumothorax or hemothorax. 2. Cardiomegaly with aortic atherosclerosis and central vascular congestion. Electronically Signed   By: Ashley Royalty M.D.   On: 06/04/2018 22:35   Ct Head Wo Contrast  Result Date: 06/04/2018 CLINICAL DATA:  84 year old female with history of mechanical fall. EXAM: CT HEAD WITHOUT CONTRAST CT CERVICAL SPINE WITHOUT CONTRAST TECHNIQUE: Multidetector CT imaging of the head and cervical spine was performed following the standard protocol without intravenous contrast. Multiplanar CT image reconstructions of the cervical spine were also generated. COMPARISON:  Head CT 03/14/2014.  Cervical spine CT 04/25/2010. FINDINGS: CT HEAD FINDINGS Brain: Mild cerebral and cerebellar atrophy. Patchy and confluent areas of decreased attenuation are noted throughout the deep and periventricular white matter of the cerebral hemispheres bilaterally, compatible with chronic microvascular ischemic disease. Mega cisterna magna again noted. No evidence of acute infarction, hemorrhage, hydrocephalus, extra-axial collection or mass lesion/mass effect. Vascular: No hyperdense vessel or unexpected calcification. Skull: Normal. Negative for fracture or focal lesion. Sinuses/Orbits: No acute finding. Other: None. CT CERVICAL SPINE FINDINGS Alignment: Normal. Skull base and vertebrae: No acute fracture. No primary bone lesion or focal pathologic process. Soft tissues and spinal canal: No prevertebral fluid or swelling. No visible canal hematoma. Disc levels: Multilevel degenerative disc disease, most severe at C5-C6 and C6-C7. Severe multilevel facet arthropathy. Upper chest: Unremarkable. Other: Negative. IMPRESSION: 1. No evidence of significant acute traumatic injury to the skull, brain or cervical spine. 2. Mild cerebral and cerebellar atrophy with severe chronic microvascular ischemic changes in the cerebral white matter, similar to the prior study. 3. Multilevel degenerative disc disease and facet arthropathy,  as above. Electronically Signed   By: Vinnie Langton M.D.   On: 06/04/2018 21:36   Ct Cervical Spine Wo Contrast  Result Date: 06/04/2018 CLINICAL DATA:  84 year old female with history of mechanical fall. EXAM: CT HEAD WITHOUT CONTRAST CT CERVICAL SPINE WITHOUT CONTRAST TECHNIQUE: Multidetector CT imaging of the head and cervical spine was performed following the standard protocol without intravenous contrast. Multiplanar CT image reconstructions of the cervical spine were also generated. COMPARISON:  Head CT 03/14/2014.  Cervical spine CT 04/25/2010. FINDINGS: CT HEAD FINDINGS Brain: Mild cerebral and cerebellar atrophy. Patchy and confluent areas of decreased attenuation are noted throughout the deep and periventricular white matter of the cerebral hemispheres bilaterally, compatible with chronic microvascular ischemic disease. Mega cisterna magna again noted. No evidence of acute infarction, hemorrhage, hydrocephalus,  extra-axial collection or mass lesion/mass effect. Vascular: No hyperdense vessel or unexpected calcification. Skull: Normal. Negative for fracture or focal lesion. Sinuses/Orbits: No acute finding. Other: None. CT CERVICAL SPINE FINDINGS Alignment: Normal. Skull base and vertebrae: No acute fracture. No primary bone lesion or focal pathologic process. Soft tissues and spinal canal: No prevertebral fluid or swelling. No visible canal hematoma. Disc levels: Multilevel degenerative disc disease, most severe at C5-C6 and C6-C7. Severe multilevel facet arthropathy. Upper chest: Unremarkable. Other: Negative. IMPRESSION: 1. No evidence of significant acute traumatic injury to the skull, brain or cervical spine. 2. Mild cerebral and cerebellar atrophy with severe chronic microvascular ischemic changes in the cerebral white matter, similar to the prior study. 3. Multilevel degenerative disc disease and facet arthropathy, as above. Electronically Signed   By: Vinnie Langton M.D.   On: 06/04/2018  21:36   Ct Hip Right Wo Contrast  Result Date: 06/04/2018 CLINICAL DATA:  Right hip pain after fall. EXAM: CT OF THE RIGHT HIP WITHOUT CONTRAST TECHNIQUE: Multidetector CT imaging of the right hip was performed according to the standard protocol. Multiplanar CT image reconstructions were also generated. COMPARISON:  None. FINDINGS: Bones/Joint/Cartilage Faint nondisplaced fracture lucency involving the greater trochanter of the right femur without avulsion. Gluteal calcific tendinopathy seen adjacent to this within the soft tissues. Rim of osteophytes is noted about the femoral head-neck junction with osteoarthritic spurring also noted of the right acetabulum. The pubic rami and pubic symphysis appear intact with osteoarthritis of the pubic symphysis. Ligaments Suboptimally assessed by CT. Muscles and Tendons No muscle atrophy or intramuscular hemorrhage. Soft tissues No subcutaneous soft tissue hematoma or significant soft tissue induration. IMPRESSION: 1. Faint linear lucency involving the lateral aspect of the greater trochanter is suspicious for a nondisplaced incomplete fracture. No avulsion is noted. 2. Osteoarthritis of the right hip. Electronically Signed   By: Ashley Royalty M.D.   On: 06/04/2018 22:30   Dg Hip Unilat  With Pelvis 2-3 Views Right  Result Date: 06/04/2018 CLINICAL DATA:  Right hip pain following a fall. EXAM: DG HIP (WITH OR WITHOUT PELVIS) 2-3V RIGHT COMPARISON:  None. FINDINGS: Moderate right hip degenerative changes. No fracture or dislocation seen. Lower lumbar spine degenerative changes. IMPRESSION: 1. No fracture or dislocation. 2. Moderate right hip degenerative changes. 3. Lower lumbar spine degenerative changes. Electronically Signed   By: Claudie Revering M.D.   On: 06/04/2018 21:18    ____________________________________________    PROCEDURES  Procedure(s) performed:    Procedures    Medications  HYDROcodone-acetaminophen (NORCO/VICODIN) 5-325 MG per tablet 1  tablet (1 tablet Oral Given 06/04/18 2238)     ____________________________________________   INITIAL IMPRESSION / ASSESSMENT AND PLAN / ED COURSE  Pertinent labs & imaging results that were available during my care of the patient were reviewed by me and considered in my medical decision making (see chart for details).  Review of the [place] CSRS was performed in accordance of the [place] prior to dispensing any controlled drugs.      Patient's diagnosis is consistent with fall at home resulting in multiple rib fractures, greater trochanteric fracture.  Patient presented to the emergency department with right-sided rib pain, hip pain and striking her head during a fall at home.  Patient was neurologically intact on arrival.  CT scan of the head and neck is reassuring with no acute findings.  X-ray of the right wrist reveals multiple rib fractures.  Initial x-ray of the hip was concerning for possible underlying fracture even with  negative results, CT scan was ordered which returns with lucency of the greater trochanteric for possible incomplete fracture.  I discussed the case with orthopedic surgeon, Dr Donivan Scull who states that the patient may go home, use assistive device and ambulate as tolerated.  Follow-up in 2 weeks with orthopedics.. Patient will be discharged home with prescriptions for Norco to be taken only with extreme pain.  Patient is given instructions regarding prescription to be taken with extreme caution to prevent further complications such as constipation and/or dizziness, drowsiness, instability.. Patient is to follow up with me care as needed or otherwise directed orthopedics in 2 weeks. Patient is given ED precautions to return to the ED for any worsening or new symptoms.     ____________________________________________  FINAL CLINICAL IMPRESSION(S) / ED DIAGNOSES  Final diagnoses:  Fall in home, initial encounter  Closed fracture of multiple ribs of right side, initial encounter   Closed nondisplaced fracture of greater trochanter of right femur, initial encounter ([place])      NEW MEDICATIONS STARTED DURING THIS VISIT:  ED Discharge Orders         Ordered    HYDROcodone-acetaminophen (NORCO/VICODIN) 5-325 MG tablet  Every 4 hours PRN     06/04/18 2258              This chart was dictated using voice recognition software/Dragon. Despite best efforts to proofread, errors can occur which can change the meaning. Any change was purely unintentional.    Darletta Moll, PA-C 06/04/18 2343    Earleen Newport, MD 06/07/18 [phone]

## 2018-06-04 NOTE — ED Triage Notes (Addendum)
Pt presents to ED via [facility] from home c/o mechanical fall and R hip pain. Pt states she was in bathroom and tripped over rug, falling on to R hip. States she thinks she hit her head as well but denies LOC. Denies blood thinner use. No shortening or rotation noted at this time.

## 2018-06-05 NOTE — Assessment & Plan Note (Signed)
UA is clear today  Suspect some element of overactive bladder with urgency  Anxiety may also fuel this   Will continue to follow

## 2018-06-05 NOTE — Assessment & Plan Note (Signed)
Currently on prozac 40 mg daily  Having more anxiety attacks as well as generalized anxiety Not interested in counseling  Reviewed stressors/ coping techniques/symptoms/ support sources/ tx options and side effects in detail today - she does not site a specific stressor  Disc opt for tx  Will add buspar 7.5 mg bid (can titrate up if successful)  Discussed expectations of this medication including time to effectiveness and mechanism of action, also poss of side effects (early and late)- including mental fuzziness, weight or appetite change, nausea and poss of worse dep or anxiety (even suicidal thoughts)  Pt voiced understanding and will stop med and update if this occurs  >25 minutes spent in face to face time with patient, >50% spent in counselling or coordination of care - including review of importance of self care as well as exercise at tolerated

## 2018-06-06 NOTE — Progress Notes (Signed)
JEREE, DELCID ([id number]) Visit Report for 05/26/2018 Debridement Details [name], [name]. Date of Service: 05/26/2018 8:30 AM Medical Record Number: [id number] Patient Account Number: [id number] Date of Birth/Sex: 12/24/1933 (84 y.o. F) Treating RN: Cornell Barman Primary Care Provider: Loura Pardon Other Clinician: Referring Provider: Loura Pardon Treating Provider/Extender: Tito Dine in Treatment: 11 Debridement Performed for Wound #6 Left Knee Assessment: Performed By: Physician Ricard Dillon, MD Debridement Type: Debridement Pre-procedure Verification/Time Yes - 09:34 Out Taken: Start Time: 09:34 Pain Control: Other : lidocaine 4% Total Area Debrided (L x W): 0.9 (cm) x 0.4 (cm) = 0.36 (cm) Tissue and other material Non-Viable, Eschar debrided: Level: Non-Viable Tissue Debridement Description: Selective/Open Wound Instrument: Curette Bleeding: None End Time: 09:35 Response to Treatment: Procedure was tolerated well Level of Consciousness: Awake and Alert Post Debridement Measurements of Total Wound Length: (cm) 0.9 Width: (cm) 0.4 Depth: (cm) 0.1 Volume: (cm) 0.028 Character of Wound/Ulcer Post Debridement: Requires Further Debridement Post Procedure Diagnosis Same as Pre-procedure Electronic Signature(s) Signed: 05/26/2018 6:17:43 PM By: Linton Ham MD Signed: 05/28/2018 6:17:48 PM By: Gretta Cool, BSN, RN, CWS, Kim RN, BSN Entered By: Linton Ham on 05/26/2018 10:42:33 Javid, Blima Singer ([id number]) -------------------------------------------------------------------------------- HPI Details [name]. Date of Service: 05/26/2018 8:30 AM Medical Record Number: [id number] Patient Account Number: [id number] Date of Birth/Sex: 10/26/1934 (84 y.o. F) Treating RN: Cornell Barman Primary Care Provider: Loura Pardon Other Clinician: Referring Provider: Loura Pardon Treating Provider/Extender: Tito Dine in  Treatment: 11 History of Present Illness HPI Description: 10/28/16; this is an 84 year old woman who arrives with a complicated medical issue. 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 Initially in 2000 and apparently most recently in 2014. 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 Indeed she was hospitalized on 02/13/16 and discharged on 02/19/16. She was taken to the OR and it was found that the popliteal abscess was communicating with her joint space. Signed to feel fluid grew MSSA. Patient was treated with IandD of the abscess. She completed IV cefazolin on 03/28/16 and since then has been on Keflex and rifampin for chronic suppression. Her infectious disease doctor is Dr.Shrestha at [facility]. The patient was seen in our [place] sister clinic through much of October and November 2017. At that point she had a draining sinus that eventually closed over. She noted some pain behind her knee surrounding Christmas day and was seen in our emergency room on 10/23/16. During this ER visit she underwent tendon incision and drainage of a left Baker's cyst and clear colored synovial fluid was obtained. Doxycycline was added for a week to her Keflex and rifampin. A CT scan was ordered but I don't think was ever done due to artifacts from hardware. Her wound culture was negative. She has been using iodoform packing. She states her knee is painful when she stands on it for a period of time. She is not systemically unwell. She has a history of scleroderma or without is been under control for some period of time. 11/04/16 the culture that I did of this area last week showed coag-negative staph and a few enterococcus faecium. Enterococcus is ampicillin resistant. The coag negative staph is only sensitive to vancomycin and tetracycline. Previous joint infection was methicillin sensitive staph aureus. It seems I misunderstood what she said and that her infectious  disease clinic appointment is this Friday and the orthopedic appointment was last Friday. I faxed the culture results  to the infectious disease clinic but I've given him a copy of it today. Per the patient and her family they orthopedic surgeon did not want to do anything further to this joint. The patient is not systemically unwell in particular no fever or chills and the pain she has is really minimal 11/18/16; this is a patient who has an infected left knee prosthetic joint. She went to see infectious disease at [facility] who apparently did did not recommend any further antibiotic therapy. She continues to feel the same episodic pain but no systemic symptoms of particular no fever. The depth of the wound in the popliteal fossa of her left knee is 3.5 versus 5 cm last week 11/25/16 4.5 cm of depth today. Draining exit site. We have treating this with iodoform packing however I really have no expectation of healing and to be truthful the amount of drainage that is coming out of this knee I don't think closure of this site is necessarily considered a positive outcome. Her left knee remains warm but not painful there is an effusion she has no systemic symptoms no fever no chills or and she feels well 12/31/16. Still is same amount of depth today using a skinny at 4.6 cm. There is less drainage. We have been using iodoform packing and I really have no expectation of healing and to be truthful closing this sinus probably is really not the best thing to consider. 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 She is not felt to be a candidate for a fourth replacement. In the meantime she does not complain of pain except when the dressing is being changed episodically. She has not been systemically unwell no fever or chills or other issues. 01/14/17; after the patient left on 3/7 her daughter tells me the next day the wound had closed over and therefore this is not intact since. Over the last 3  days the patient has noted significant pain in the popliteal fossa of the involved left knee. 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 MSSA was the cause of the original identified joint infection treated at [facility]: Lambert Rhonda W. 01/21/17; I saw this patient last week at which time there was intense erythema in the popliteal fossa, tenderness and a ballotable fluid collection. I did an IandD on this. Surprisingly the culture of this was negative although as a result of the IandD her tenderness and erythema in the area is resolved. We continue with the iodoform packing READMISSION 04/07/17; this is a patient I know from 2 prior admissions to this clinic starting in January of this year. Her history is essentially as noted above. 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 She has a draining sinus into the popliteal fossa of the left knee. This remains open and drains. She has been using iodoform packing to this area with gauze over the top. To be truthful I haven't really been certain whether having this area close would be helpful or harmful to this patient. Previously when it's closed she develops a lot more pain in [place], ALVARETTA EISENBERGER. ([id number]) the back of the popliteal fossa and more pain in the knee. She is here today because the packing of the area has become increasingly painful in fact the patient describes this as a "torture chamber" experience. They are packing this wound twice a day. The patient follows with  orthopedic surgery and infectious disease at [facility] although she hasn't seen them since she was last here in March. She denies any systemic symptoms including fever chills etc. She states except for the changing of the dressing her pain when she is mobilizing on the knee is 2/10. She also has a area on the left anterior patella in the  incision that is excoriated open. The patient states this is itchy and she does a lot of scratching. They state this was here last time she was here but I have no recollection or documentation of this 04/21/17; patient was readmitted to clinic 2 weeks ago. She has a draining sinus in the left popliteal fossa and a chronically infected left total knee replacement. She also had an excoriation on the left anterior knee. She arrives in clinic today with all of this looking considerably better. The divot in the popliteal fossa is actually closed. READMISSION 07/15/17; the patient arrived today with a real opening of the draining sinus in her left popliteal fossa. This opened about a week ago after developing a painful "knot". Is been draining clear fluid. The patient's pain is a lot better. She is not been systemically unwell. oShe also is been dealing with an area on the left anterior leg just below the tibial plateau. She also had this last time. Her daughter states that she scratches this area continuously. I have reviewed the last infectious disease note from August. 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 Much to my chagrin they seem reluctant to tap the joint. A culture of purulent material that came out of the popliteal fossa earlier this year cultured coag-negative staph aureus [not an uncommon cause of prosthetic joint infection]. However they really ignored this and they state that the source was questionable. If they believe this I am wondering why they're so reluctant to tap the fluid. She has not seen orthopedics 07/22/17; patient's sinus tract in the popliteal fossa about 2.5 cm. The area on the anterior part of her knee looks a lot better. We have been using iodoform packing to the popliteal fossa, collagen to the anterior part of her knee. She is not experiencing undue pain or systemic symptoms 08/05/17;  patient has a sinus tract in the popliteal fossa on the left. 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 She does not have any options for further replacements she is only been offered an above-knee amputation. 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 She has not been systemically unwell no fever. There is some pain and swelling in the knee but all in all this is not unbearable. They've been using iodoform packing 09/02/17; continued sinus tract on the popliteal fossa on the left as part of a chronic infection on a left total knee replacement. She follows with infectious disease at [facility] although the patient and her daughter are not exactly sure when her next appointment is. She has not been systemically unwell specifically denies fever chills. She has some pain in the left knee however she is reluctant to take even simple analgesics because of the risk of" addiction" 09/30/17; continued sinus tract in the popliteal fossa on the left as part of a chronic infection on the left total knee replacement. She has not been following recently with infectious disease at [facility] She has some pain in the left knee although this is not major and  not changed over the last recent months. Depth of the probing area is 1.9 cm READMISSION 03/10/18; this is a patient we have seen in this clinic several times before predominantly related to a draining sinus tract in the left popliteal fossa as part of a chronic infection in the left total knee replacement. She sought infectious disease in February. A discontinue doxycycline she was on at the time and continued her on long-standing prophylactic cephalexin which she is still on. 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 I see she was seen by pulmonary in April with chronic respiratory failure with hypoxia and cor pulmonale and pulmonary hypertension. She comes clinic this time with the  sudden onset of 2 small wounds on the right anterior patella and the right anterior lower leg just below the tibial tuberosity. It is not really clear how this happened although presumably minor trauma that the patient didn't recognize. Her daughter states that she simply noticed blood on the sheets. She may have picked at her skin as well. I believe we've had superficial wounds on the patella previously in this clinic didn't seem to heal over fairly easily. The patient has not been systemically unwell no fever no chills she does not have a lot of pain in the knee just occasionally when she is up on it 03/17/18; this is a patient we have seen in this clinic previously. She has a chronically infected left total knee replacement that is not felt by [facility] to be a candidate for joint replacement. Previous organism has been MSSA. She has a draining sinus in the left popliteal fossa. I think the area here is been stable. AMELYA, MABRY. ([id number]) She has superficial areas over the left patella and just below the tibial tuberosity. We've using silver collagen. The area over the patellas just about closed and these area under the tibial tuberosity is smaller this week. 03/31/18; patient with a chronically infected left total knee replacement that is not felt by [facility] to be candidate for joint replacement. She has a draining area in the popliteal fossa which is chronically allowed to drain. She has not tolerated when this closes oShe has a superficial area on the left patella and one just under the left tibial tuberosity. 04/14/18; this is a patient with a chronically infected left total knee replacement that is not felt to be a candidate for further joint replacement surgery. She has a draining sinus in the popliteal fossa which drain sign no real fluid. Previously when this area has closed over she develops pain and swelling in the popliteal fossa similar to an abscess and it has to be IandD. 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 She also has a refractory area over the patella on the left. This at times is looked as though it's progressing towards closure although today it looks fairly stalled. We've been using silver alginate 05/19/18; palliative basis for a draining sinus in the left popliteal fossa secondary to septic arthritis which drains in this area. She has a total knee replacement and is not felt to be a candidate for a redo of  that prosthesis. oShe also has a refractory area on the left patella. Actually looks fairly healthy today. Last visit changed her to [facility] with a Telfa covering 05/26/18; we follow her on a palliative basis for a draining sinus in the left popliteal fossa. oShe has also a refractory wound on the left patella. I brought her in more frequently for a surface debridement and change the dressing Hydrofera Blue last time and today this wound looks really quite good. Still debridement required however the wound is smaller with a healthy granulated bed. Electronic Signature(s) Signed: 05/26/2018 6:17:43 PM By: Linton Ham MD Entered By: Linton Ham on 05/26/2018 10:43:42 [name] ([id number]) -------------------------------------------------------------------------------- Physical Exam Details Patient Name: WHITLEY, PATCHEN. Date of Service: 05/26/2018 8:30 AM Medical Record Number: [id number] Patient Account Number: [id number] Date of Birth/Sex: [birth year] (84 y.o. F) Treating RN: Cornell Barman Primary Care Provider: Loura Pardon Other Clinician: Referring Provider:  Loura Pardon Treating Provider/Extender: Tito Dine in Treatment: 11 Notes wound exam oThe original draining sinus wound is unchanged. I debrided the wound of some slight amount of necrotic adherent debris but significant eschar around the circumference of the wound. This is much better post debridement smaller. Hemostasis with direct pressure. There also appears to be less synovial fluid/effusion than I'm used to seeing and the tension on the tissue may be improved, that could be a reason why this is also getting better Electronic Signature(s) Signed: 05/26/2018 6:17:43 PM By: Linton Ham MD Entered By: Linton Ham on 05/26/2018 10:44:49 [name] ([id number]) -------------------------------------------------------------------------------- Physician Orders Details [name]. Date of Service: 05/26/2018 8:30 AM Medical Record Number: [id number] Patient Account Number: [id number] Date of Birth/Sex: 1934/01/13 (84 y.o. F) Treating RN: Cornell Barman Primary Care Provider: Loura Pardon Other Clinician: Referring Provider: Loura Pardon Treating Provider/Extender: Tito Dine in Treatment: 11 Verbal / Phone Orders: No Diagnosis Coding Wound Cleansing Wound #6 Left Knee o Cleanse wound with mild soap and water Wound #8 Left,Posterior Knee o Cleanse wound with mild soap and water Anesthetic (add to Medication List) Wound #6 Left Knee o Topical Lidocaine 4% cream applied to wound bed prior to debridement (In Clinic Only). Wound #8 Left,Posterior Knee o Topical Lidocaine 4% cream applied to wound bed prior to debridement (In Clinic Only). Primary Wound Dressing Wound #6 Left Knee o Hydrafera Blue Ready Transfer Wound #8 Left,Posterior Knee o ABD Pad Secondary Dressing Wound #6 Left Knee o [place] Wound #8 Left,Posterior Knee o Boardered Foam Dressing Dressing Change Frequency Wound #6 Left Knee o Change  dressing every other day. Wound #8 Left,Posterior Knee o Change dressing every other day. Follow-up Appointments Wound #6 Left Knee o Return Appointment in 1 month Wound #8 Left,Posterior Knee o Return Appointment in 1 month Edema Control SUVI, ARCHULETTA ([id number]) Wound #6 Left Knee o Elevate legs to the level of the heart and pump ankles as often as possible Wound #8 Left,Posterior Knee o Elevate legs to the level of the heart and pump ankles as often as possible Electronic Signature(s) Signed: 05/26/2018 6:17:43 PM By: Linton Ham MD Signed: 05/28/2018 6:17:48 PM By: Gretta Cool, BSN, RN, CWS, Kim RN, BSN Entered By: Gretta Cool, BSN, RN, CWS, Kim on 05/26/2018 09:35:59 [name] ([id number]) -------------------------------------------------------------------------------- Problem List Details Patient Name: GERLENE, GLASSBURN. Date of Service: 05/26/2018 8:30 AM Medical Record Number: [id number] Patient Account Number: [id number] Date of Birth/Sex: [birth year] (84 y.o. 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 #2 Making : continue Hydrofera Blue M71.062 Abscess of bursa, left knee 03/10/2018 Yes Yes Status Complications Interventions No the patient has a draining fistula tract from the infected knee no additional change joint to the skin in the popliteal fossa. This drains interventions other Medical significantly and has to be changed 3 times a day than topical  agents Decision to collect the Making : drainage. I have selected ABD pads. Inactive Problems Resolved Problems Electronic Signature(s) Signed: 05/26/2018 6:17:43 PM By: Linton Ham MD Entered By: Linton Ham on 05/26/2018 10:41:57 [name] ([id number]) -------------------------------------------------------------------------------- Progress Note Details [name]. Date of Service: 05/26/2018 8:30 AM Medical Record Number: [id number] Patient Account Number: [id number] Date of Birth/Sex: December 28, 1933 (84 y.o. F) Treating RN: Cornell Barman Primary Care Provider: Loura Pardon Other Clinician: Referring Provider: Loura Pardon Treating Provider/Extender: Tito Dine in Treatment: 11 Subjective History of Present Illness (HPI) 10/28/16; this is an 84 year old woman who arrives with a complicated medical issue. 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 Initially in 2000 and apparently most recently in 2014. 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 Indeed she was hospitalized on 02/13/16 and discharged on 02/19/16. She was taken to the OR and it was found that the popliteal abscess was communicating with her joint space. Signed to feel fluid grew MSSA. Patient was treated with IandD of the abscess. She completed IV cefazolin on 03/28/16 and since then has been on Keflex and rifampin for chronic suppression. Her infectious disease doctor is Dr.Shrestha at [facility]. The patient was seen in our [place] sister clinic through much of October and November 2017. At that point she had a draining sinus that eventually closed over. She noted some pain behind her knee surrounding Christmas day and was seen in our emergency room on 10/23/16. During this ER visit she underwent tendon incision and drainage of a left Baker's cyst and clear colored synovial fluid was obtained. Doxycycline was added for a week to her  Keflex and rifampin. A CT scan was ordered but I don't think was ever done due to artifacts from hardware. Her wound culture was negative. She has been using iodoform packing. She states her knee is painful when she stands on it for a period of time. She is not systemically unwell. She has a history of scleroderma or without is been under control for some period of time. 11/04/16 the culture that I did of this area last week showed coag-negative staph and a few enterococcus faecium. Enterococcus is ampicillin resistant. The coag negative staph is only sensitive to vancomycin and tetracycline. Previous joint infection was methicillin sensitive staph aureus. It seems I misunderstood what she said and that her infectious disease clinic appointment is this Friday and the orthopedic appointment was last Friday. I faxed the culture results to the infectious disease clinic but I've given him a copy of it today. Per the patient and her family they orthopedic surgeon did not want to do anything further to this joint. The patient is not systemically unwell in particular no fever or chills and the  pain she has is really minimal 11/18/16; this is a patient who has an infected left knee prosthetic joint. She went to see infectious disease at [facility] who apparently did did not recommend any further antibiotic therapy. She continues to feel the same episodic pain but no systemic symptoms of particular no fever. The depth of the wound in the popliteal fossa of her left knee is 3.5 versus 5 cm last week 11/25/16 4.5 cm of depth today. Draining exit site. We have treating this with iodoform packing however I really have no expectation of healing and to be truthful the amount of drainage that is coming out of this knee I don't think closure of this site is necessarily considered a positive outcome. Her left knee remains warm but not painful there is an effusion she has no systemic symptoms no fever no chills or and she  feels well 12/31/16. Still is same amount of depth today using a skinny at 4.6 cm. There is less drainage. We have been using iodoform packing and I really have no expectation of healing and to be truthful closing this sinus probably is really not the best thing to consider. 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 She is not felt to be a candidate for a fourth replacement. In the meantime she does not complain of pain except when the dressing is being changed episodically. She has not been systemically unwell no fever or chills or other issues. 01/14/17; after the patient left on 3/7 her daughter tells me the next day the wound had closed over and therefore this is not intact since. Over the last 3 days the patient has noted significant pain in the popliteal fossa of the involved left knee. 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 MSSA was the cause of the original identified joint infection treated at [facility]. 01/21/17; I saw this patient last week at which time there was intense erythema in the popliteal fossa, tenderness and a ballotable fluid collection. I did an IandD on this. Surprisingly the culture of this was negative although as a result of the IandD her tenderness and erythema in the area is resolved. We continue with the iodoform packing READMISSION 04/07/17; this is a patient I know from 2 prior admissions to this clinic starting in January of this year. Her history is essentially as noted above. 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 She has a draining sinus into the popliteal fossa of the left knee. This remains open and drains. She has been using iodoform packing to this area with gauze over the top. To be truthful I haven't really been certain whether ANGELMARIE, PONZO. ([id number]) having this area close  would be helpful or harmful to this patient. Previously when it's closed she develops a lot more pain in the back of the popliteal fossa and more pain in the knee. She is here today because the packing of the area has become increasingly painful in fact the patient describes this as a "torture chamber" experience. They are packing this wound twice a day. The patient follows with orthopedic surgery and infectious disease at [facility] although she hasn't seen them since she was last here in March. She denies any systemic symptoms including fever chills etc. She states except for the changing of the dressing her pain when she is mobilizing on the knee  is 2/10. She also has a area on the left anterior patella in the incision that is excoriated open. The patient states this is itchy and she does a lot of scratching. They state this was here last time she was here but I have no recollection or documentation of this 04/21/17; patient was readmitted to clinic 2 weeks ago. She has a draining sinus in the left popliteal fossa and a chronically infected left total knee replacement. She also had an excoriation on the left anterior knee. She arrives in clinic today with all of this looking considerably better. The divot in the popliteal fossa is actually closed. READMISSION 07/15/17; the patient arrived today with a real opening of the draining sinus in her left popliteal fossa. This opened about a week ago after developing a painful "knot". Is been draining clear fluid. The patient's pain is a lot better. She is not been systemically unwell. She also is been dealing with an area on the left anterior leg just below the tibial plateau. She also had this last time. Her daughter states that she scratches this area continuously. I have reviewed the last infectious disease note from August. 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 Much to  my chagrin they seem reluctant to tap the joint. A culture of purulent material that came out of the popliteal fossa earlier this year cultured coag-negative staph aureus [not an uncommon cause of prosthetic joint infection]. However they really ignored this and they state that the source was questionable. If they believe this I am wondering why they're so reluctant to tap the fluid. She has not seen orthopedics 07/22/17; patient's sinus tract in the popliteal fossa about 2.5 cm. The area on the anterior part of her knee looks a lot better. We have been using iodoform packing to the popliteal fossa, collagen to the anterior part of her knee. She is not experiencing undue pain or systemic symptoms 08/05/17; patient has a sinus tract in the popliteal fossa on the left. 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 She does not have any options for further replacements she is only been offered an above-knee amputation. 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 She has not been systemically unwell no fever. There is some pain and swelling in the knee but all in all this is not unbearable. They've been using iodoform packing 09/02/17; continued sinus tract on the popliteal fossa on the left as part of a chronic infection on a left total knee replacement. She follows with infectious disease at [facility] although the patient and her daughter are not exactly sure when her next appointment is. She has not been systemically unwell specifically denies fever chills. She has some pain in the left knee however she is reluctant to take even simple analgesics because of the risk of" addiction" 09/30/17; continued sinus tract in the popliteal fossa on the left as part of a chronic infection on the left total knee replacement. She has not been following recently with infectious disease at [facility]. She has some pain in the left knee although this is not major and not  changed over the last recent months. Depth of the probing area is 1.9 cm READMISSION 03/10/18; this is a patient we have seen in this clinic several times before predominantly related to a draining sinus tract in the left popliteal fossa as part of a  chronic infection in the left total knee replacement. She sought infectious disease in February. A discontinue doxycycline she was on at the time and continued her on long-standing prophylactic cephalexin which she is still on. 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 I see she was seen by pulmonary in April with chronic respiratory failure with hypoxia and cor pulmonale and pulmonary hypertension. She comes clinic this time with the sudden onset of 2 small wounds on the right anterior patella and the right anterior lower leg just below the tibial tuberosity. It is not really clear how this happened although presumably minor trauma that the patient didn't recognize. Her daughter states that she simply noticed blood on the sheets. She may have picked at her skin as well. I believe we've had superficial wounds on the patella previously in this clinic didn't seem to heal over fairly easily. The patient has not been systemically unwell no fever no chills she does not have a lot of pain in the knee just occasionally when she is up on it 03/17/18; this is a patient we have seen in this clinic previously. She has a chronically infected left total knee replacement that is not felt by [facility] to be a candidate for joint replacement. Previous organism has been MSSA. She has a draining sinus in the left popliteal fossa. I think the area here is been stable. REATHA, SUR. ([id number]) She has superficial areas over the left patella and just below the tibial tuberosity. We've using silver collagen. The area over the patellas just about closed and these area under the tibial tuberosity is smaller this week. 03/31/18; patient with a  chronically infected left total knee replacement that is not felt by [facility] to be candidate for joint replacement. She has a draining area in the popliteal fossa which is chronically allowed to drain. She has not tolerated when this closes She has a superficial area on the left patella and one just under the left tibial tuberosity. 04/14/18; this is a patient with a chronically infected left total knee replacement that is not felt to be a candidate for further joint replacement surgery. She has a draining sinus in the popliteal fossa which drain sign no real fluid. Previously when this area has closed over she develops pain and swelling in the popliteal fossa similar to an abscess and it has to be IandD. 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 She also has a refractory area over the patella on the left. This at times is looked as though it's progressing towards closure although today it looks fairly stalled. We've been using silver alginate 05/19/18; palliative basis for a draining sinus in the left popliteal fossa secondary to septic arthritis which drains in this area. She has a total knee replacement and is not felt to be a candidate for a redo of that prosthesis. She also has a refractory area on the left patella. Actually looks fairly healthy today. Last visit changed her to [facility] with a Telfa covering 05/26/18; we follow her on a palliative basis for a draining sinus in the left popliteal fossa. She has  also a refractory wound on the left patella. I brought her in more frequently for a surface debridement and  change the dressing Hydrofera Blue last time and today this wound looks really quite good. Still debridement required however the wound is smaller with a healthy granulated bed. Objective Constitutional Vitals Time Taken: 8:49 AM, Height: 66 in, Temperature: 97.5 F, Pulse: 67 bpm, Respiratory Rate: 16 breaths/min, Blood Pressure: 128/54 mmHg. Integumentary (Hair, Skin) Wound #6 status is Open. Original cause of wound was Gradually Appeared. The wound is located on the Left Knee. The wound measures 0.9cm length x 0.4cm width x 0.1cm depth; 0.283cm^2 area and 0.028cm^3 volume. There is no tunneling or undermining noted. There is a medium amount of serosanguineous drainage noted. The wound margin is flat and intact. There is large (67-100%) red granulation within the wound bed. There is a small (1-33%) amount of necrotic tissue within the wound bed including Eschar and Adherent Slough. The periwound skin appearance had no abnormalities noted for color. Periwound temperature was noted as No Abnormality. The periwound has tenderness on palpation. Wound #8 status is Open. Original cause of wound was Surgical Injury. The wound is located on the Left,Posterior Knee. The wound measures 0.2cm length x 0.2cm width x 1.3cm depth; 0.031cm^2 area and 0.041cm^3 volume. There is no tunneling or undermining noted. There is a large amount of serosanguineous drainage noted. The wound margin is flat and intact. There is large (67-100%) pink granulation within the wound bed. There is no necrotic tissue within the wound bed. The periwound skin appearance did not exhibit: Callus, Crepitus, Excoriation, Induration, Rash, Scarring, Dry/Scaly, Maceration, Atrophie Blanche, Cyanosis, Ecchymosis, Hemosiderin Staining, Mottled, Pallor, Rubor, Erythema. Periwound temperature was noted as No Abnormality. The periwound has tenderness on palpation. 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 There was a Selective/Open Wound Non-Viable Tissue Debridement with a total area of 0.36 sq cm performed by Ricard Dillon, MD. With the following instrument (s): Curette to remove Non-Viable tissue/material. Material removed includes Eschar after achieving pain control using Other (lidocaine 4%). No specimens were taken. A time out was conducted at 09:34, prior to the start of the procedure. There was no bleeding. The procedure was tolerated well. Patient s Level of Consciousness post procedure was recorded as Awake and Alert. Post Debridement Measurements: 0.9cm length x 0.4cm width x 0.1cm depth; 0.028cm^3 volume. Character of Wound/Ulcer Post Debridement requires further debridement. Post procedure Diagnosis Wound #6: Same as Pre-Procedure Plan Wound Cleansing: Wound #6 Left Knee: Cleanse wound with mild soap and water Wound #8 Left,Posterior Knee: Cleanse wound with mild soap and water Anesthetic (add to Medication List): Wound #6 Left Knee: Topical Lidocaine 4% cream applied to wound bed prior to debridement (In Clinic Only). Wound #8 Left,Posterior Knee: Topical Lidocaine 4% cream applied to wound bed prior to debridement (In Clinic Only). Primary Wound Dressing: Wound #6 Left Knee: Hydrafera Blue Ready Transfer Wound #8 Left,Posterior Knee: ABD Pad Secondary Dressing: Wound #6 Left Knee: [place] Wound #8 Left,Posterior Knee: Boardered Foam Dressing Dressing Change Frequency: Wound #6 Left Knee: Change dressing every other day. Wound #8 Left,Posterior Knee: LOVINIA, SNARE. ([id number]) Change dressing every other day. Follow-up Appointments: Wound #6 Left Knee: Return Appointment in 1 month Wound #8 Left,Posterior Knee: Return Appointment in  1 month Edema  Control: Wound #6 Left Knee: Elevate legs to the level of the heart and pump ankles as often as possible Wound #8 Left,Posterior Knee: Elevate legs to the level of the heart and pump ankles as often as possible Medical Decision Making Non-pressure chronic ulcer of unspecified part of left lower leg with other specified severity 03/10/2018 Status: Improving Complications: his wound is quite a bit better.better looking wound surface Interventions: surface and circumferential debridement. #2 continue Hydrofera Blue Abscess of bursa, left knee 03/10/2018 Status: No change Complications: the patient has a draining fistula tract from the infected knee joint to the skin in the popliteal fossa. This drains significantly and has to be changed 3 times a day Interventions: no additional interventions other than topical agents to collect the drainage. I have selected ABD pads. #1 I'm continuing with Hydrofera Blue as the primary dressing to the superficial area over the left patella #2 no changes to the draining sinus from the septic arthritis to the popliteal fossa. This will be a simple maintenance issue over time. Electronic Signature(s) Signed: 05/26/2018 6:17:43 PM By: Linton Ham MD Entered By: Linton Ham on 05/26/2018 10:45:55 [name] ([id number]) -------------------------------------------------------------------------------- SuperBill Details [name]. Date of Service: 05/26/2018 Medical Record Number: [id number] Patient Account Number: [id number] Date of Birth/Sex: 06/10/1934 (84 y.o. F) Treating RN: Cornell Barman Primary Care Provider: Loura Pardon Other Clinician: Referring Provider: Loura Pardon Treating Provider/Extender: Tito Dine in Treatment: 11 Diagnosis Coding ICD-10 Codes Code Description [phone] Non-pressure chronic ulcer of unspecified part of left lower leg with other specified severity M71.062 Abscess of bursa, left  knee Facility Procedures CPT4: Description Modifier Quantity Code 18563149 97597 - DEBRIDE WOUND 1ST 20 SQ CM OR < 1 ICD-10 Diagnosis Description L97.928 Non-pressure chronic ulcer of unspecified part of left lower leg with other specified severity Physician Procedures CPT4: Description Modifier Quantity Code [id number] - WC PHYS DEBR WO ANESTH 20 SQ CM 1 ICD-10 Diagnosis Description L97.928 Non-pressure chronic ulcer of unspecified part of left lower leg with other specified severity Electronic Signature(s) Signed: 05/26/2018 6:17:43 PM By: Linton Ham MD Entered By: Linton Ham on 05/26/2018 10:46:23

## 2018-06-07 ENCOUNTER — Telehealth: Payer: Self-pay

## 2018-06-07 NOTE — Telephone Encounter (Signed)
[name] (daughter) advised.

## 2018-06-07 NOTE — Telephone Encounter (Signed)
Please advise them to see when they can get in with Dr Marry Guan- if it is longer than 2 weeks please let me know and we can do a referral  Thanks for the update  I hope she is doing ok

## 2018-06-07 NOTE — Telephone Encounter (Signed)
I spoke with [name] (DPR signed); pt fell and was seen  At [facility] ED on 06/04/18 with fx to rt ribs and rt hip pain with ? Non displaced incomplete fx of rt hip. [name] has a call into Dr [employer] office for cb for appt. ED recommended to be seen by ortho in 2 weeks. [name] wants to know if pt could see another ortho sooner. [name] said pt taking the hydrocodone apap 5-325 mg taking 1 tab q 4 h for pain but so far is taking care of the pain. [name] request cb after Dr [name] reviews.

## 2018-06-07 NOTE — Telephone Encounter (Signed)
Copied from [place] [id number]. Topic: Quick Communication - See Telephone Encounter >> Jun 07, 2018  3:08 PM Antonieta Iba C wrote: CRM for notification. See Telephone encounter for: 06/07/18.  [phone] - Beverlee Nims ( pt's daughter) called in to request a call back from providers CMA

## 2018-06-09 ENCOUNTER — Ambulatory Visit: Payer: Medicare Other | Admitting: Nurse Practitioner

## 2018-06-09 NOTE — Progress Notes (Signed)
DALENE, ROBARDS ([id number]) Visit Report for 05/26/2018 Arrival Information Details [name], [name]. Date of Service: 05/26/2018 8:30 AM Medical Record Number: [id number] Patient Account Number: [id number] Date of Birth/Sex: May 22, 1934 (84 y.o. F) Treating RN: Ahmed Prima Primary Care Colston Pyle: Loura Pardon Other Clinician: Referring Quanika Solem: Loura Pardon Treating Gussie Towson/Extender: Tito Dine in Treatment: 11 Visit Information History Since Last Visit All ordered tests and consults were completed: No Patient Arrived: Walker Added or deleted any medications: No Arrival Time: 08:49 Any new allergies or adverse reactions: No Accompanied By: dtr Had a fall or experienced change in No Transfer Assistance: EasyPivot Patient activities of daily living that may affect Lift risk of falls: Patient Identification Verified: Yes Signs or symptoms of abuse/neglect since last visito No Secondary Verification Process Yes Hospitalized since last visit: No Completed: Implantable device outside of the clinic excluding No Patient Requires Transmission-Based No cellular tissue based products placed in the center Precautions: since last visit: Patient Has Alerts: No Has Dressing in Place as Prescribed: Yes Pain Present Now: Yes Electronic Signature(s) Signed: 05/31/2018 4:59:33 PM By: Alric Quan Entered By: Alric Quan on 05/26/2018 08:49:33 [name], [name] ([id number]) -------------------------------------------------------------------------------- Encounter Discharge Information Details [name]. Date of Service: 05/26/2018 8:30 AM Medical Record Number: [id number] Patient Account Number: [id number] Date of Birth/Sex: [birth year] (84 y.o. F) Treating RN: Montey Hora Primary Care Vyolet Sakuma: Loura Pardon Other Clinician: Referring Rhys Lichty: Loura Pardon Treating Gabriele Zwilling/Extender: Tito Dine in Treatment: 11 Encounter  Discharge Information Items Discharge Condition: Stable Ambulatory Status: Walker Discharge Destination: Home Transportation: Private Auto Accompanied By: dtr Schedule Follow-up Appointment: Yes Clinical Summary of Care: Electronic Signature(s) Signed: 05/26/2018 11:02:03 AM By: Montey Hora Entered By: Montey Hora on 05/26/2018 11:02:02 [name] ([id number]) -------------------------------------------------------------------------------- Lower Extremity Assessment Details [name]. Date of Service: 05/26/2018 8:30 AM Medical Record Number: [id number] Patient Account Number: [id number] Date of Birth/Sex: 1934/08/08 (84 y.o. F) Treating RN: Ahmed Prima Primary Care Kymoni Lesperance: Loura Pardon Other Clinician: Referring Jereme Loren: Loura Pardon Treating Kavaughn Faucett/Extender: Ricard Dillon Weeks in Treatment: 11 Electronic Signature(s) Signed: 05/31/2018 4:59:33 PM By: Alric Quan Entered By: Alric Quan on 05/26/2018 08:58:00 [name] ([id number]) -------------------------------------------------------------------------------- Multi Wound Chart Details [name]. Date of Service: 05/26/2018 8:30 AM Medical Record Number: [id number] Patient Account Number: [id number] Date of Birth/Sex: 11/08/1933 (84 y.o. F) Treating RN: Cornell Barman Primary Care Jocelynn Gioffre: Loura Pardon Other Clinician: Referring Liahm Grivas: Loura Pardon Treating Aisha Greenberger/Extender: Tito Dine in Treatment: 11 Vital Signs Height(in): 63 Pulse(bpm): 22 Weight(lbs): Blood Pressure(mmHg): 128/54 Body Mass Index(BMI): Temperature(F): 97.5 Respiratory Rate 16 (breaths/min): Photos: [6:No Photos] [8:No Photos] [N/A:N/A] Wound Location: [6:Left Knee] [8:Left Knee - Posterior] [N/A:N/A] Wounding Event: [6:Gradually Appeared] [8:Surgical Injury] [N/A:N/A] Primary Etiology: [6:Lesion] [8:Infection - not elsewhere classified] [N/A:N/A] Comorbid  History: [6:Cataracts, Sleep Apnea, Arrhythmia, Hypertension, Scleroderma] [8:Cataracts, Sleep Apnea, Arrhythmia, Hypertension, Scleroderma] [N/A:N/A] Date Acquired: [6:02/24/2018] [8:02/24/2018] [N/A:N/A] Weeks of Treatment: [6:11] [8:11] [N/A:N/A] Wound Status: [6:Open] [8:Open] [N/A:N/A] Measurements L x W x D [6:0.9x0.4x0.1] [8:0.2x0.2x1.3] [N/A:N/A] (cm) Area (cm) : [6:0.283] [8:0.031] [N/A:N/A] Volume (cm) : [6:0.028] [8:0.041] [N/A:N/A] % Reduction in Area: [6:62.50%] [8:56.30%] [N/A:N/A] % Reduction in Volume: [6:62.70%] [8:-192.90%] [N/A:N/A] Classification: [6:Partial Thickness] [8:Partial Thickness] [N/A:N/A] Exudate Amount: [6:Medium] [8:Large] [N/A:N/A] Exudate Type: [6:Serosanguineous] [8:Serosanguineous] [N/A:N/A] Exudate Color: [6:red, brown] [8:red, brown] [N/A:N/A] Wound Margin: [6:Flat and Intact] [8:Flat and Intact] [N/A:N/A] Granulation Amount: [6:Large (67-100%)] [8:Large (67-100%)] [N/A:N/A] Granulation Quality: [6:Red] [8:Pink] [N/A:N/A] Necrotic Amount: [6:Small (  1-33%)] [8:None Present (0%)] [N/A:N/A] Necrotic Tissue: [6:Eschar, Adherent Slough] [8:N/A] [N/A:N/A] Exposed Structures: [6:Fascia: No Fat Layer (Subcutaneous Tissue) Exposed: No Tendon: No Muscle: No Joint: No Bone: No] [8:Fascia: No Fat Layer (Subcutaneous Tissue) Exposed: No Tendon: No Muscle: No Joint: No Bone: No] [N/A:N/A] Epithelialization: [6:None] [8:None] [N/A:N/A] Debridement: [6:Debridement - Selective/Open Wound 09:34] [8:N/A N/A] [N/A:N/A N/A] Pre-procedure Verification/Time Out Taken: Pain Control: Other N/A N/A Tissue Debrided: Necrotic/Eschar N/A N/A Level: Non-Viable Tissue N/A N/A Debridement Area (sq cm): 0.36 N/A N/A Instrument: Curette N/A N/A Bleeding: None N/A N/A Debridement Treatment Procedure was tolerated well N/A N/A Response: Post Debridement 0.9x0.4x0.1 N/A N/A Measurements L x W x D (cm) Post Debridement Volume: 0.028 N/A N/A (cm) Periwound Skin Texture: No  Abnormalities Noted Excoriation: No N/A Induration: No Callus: No Crepitus: No Rash: No Scarring: No Periwound Skin Moisture: No Abnormalities Noted Maceration: No N/A Dry/Scaly: No Periwound Skin Color: No Abnormalities Noted Atrophie Blanche: No N/A Cyanosis: No Ecchymosis: No Erythema: No Hemosiderin Staining: No Mottled: No Pallor: No Rubor: No Temperature: No Abnormality No Abnormality N/A Tenderness on Palpation: Yes Yes N/A Wound Preparation: Ulcer Cleansing: Ulcer Cleansing: N/A Rinsed/Irrigated with Saline Rinsed/Irrigated with Saline Topical Anesthetic Applied: Topical Anesthetic Applied: Other: lidocaine 4% Other: lidocaine 4% Procedures Performed: Debridement N/A N/A Treatment Notes Electronic Signature(s) Signed: 05/26/2018 6:17:43 PM By: Linton Ham MD Entered By: Linton Ham on 05/26/2018 10:42:10 [name] ([id number]) -------------------------------------------------------------------------------- [place] Details [name], [name]. Date of Service: 05/26/2018 8:30 AM Medical Record Number: [id number] Patient Account Number: [id number] Date of Birth/Sex: 09-08-1934 (84 y.o. F) Treating RN: Cornell Barman Primary Care Leaira Fullam: Loura Pardon Other Clinician: Referring Breanna Shorkey: Loura Pardon Treating Camila Norville/Extender: Tito Dine in Treatment: 11 Active Inactive ` Abuse / Safety / Falls / Self Care Management Nursing Diagnoses: History of Falls Goals: Patient will not experience any injury related to falls Date Initiated: 03/10/2018 Target Resolution Date: 04/09/2018 Goal Status: Active Patient will remain injury free related to falls Date Initiated: 03/10/2018 Target Resolution Date: 04/09/2018 Goal Status: Active Interventions: Assess fall risk on admission and as needed Notes: ` Orientation to the Wound Care Program Nursing Diagnoses: Knowledge deficit related to the wound healing center  program Goals: Patient/caregiver will verbalize understanding of the [facility] Date Initiated: 03/10/2018 Target Resolution Date: 03/22/2018 Goal Status: Active Interventions: Provide education on orientation to the wound center Notes: ` Venous Leg Ulcer Nursing Diagnoses: Knowledge deficit related to disease process and management Goals: Patient will maintain optimal edema control Date Initiated: 03/10/2018 Target Resolution Date: 03/26/2018 [name], [name]. ([id number]) Goal Status: Active Interventions: Assess peripheral edema status every visit. Notes: ` Wound/Skin Impairment Nursing Diagnoses: Impaired tissue integrity Goals: Ulcer/skin breakdown will have a volume reduction of 50% by week 8 Date Initiated: 03/10/2018 Target Resolution Date: 05/10/2018 Goal Status: Active Interventions: Assess ulceration(s) every visit Treatment Activities: Skin care regimen initiated : 03/10/2018 Topical wound management initiated : 03/10/2018 Notes: Electronic Signature(s) Signed: 05/28/2018 6:17:48 PM By: Gretta Cool, BSN, RN, CWS, Kim RN, BSN Entered By: Gretta Cool, BSN, RN, CWS, Kim on 05/26/2018 09:33:55 [name], [name] ([id number]) -------------------------------------------------------------------------------- Pain Assessment Details [name]. Date of Service: 05/26/2018 8:30 AM Medical Record Number: [id number] Patient Account Number: [id number] Date of Birth/Sex: Apr 30, 1934 (84 y.o. F) Treating RN: Ahmed Prima Primary Care Amair Shrout: Loura Pardon Other Clinician: Referring Layten Aiken: Loura Pardon Treating Edmonia Gonser/Extender: Tito Dine in Treatment: 11 Active Problems Location of Pain Severity and Description of Pain  Patient Has Paino No Site Locations Rate the pain. Current Pain Level: 2 Character of Pain Describe the Pain: Aching Pain Management and Medication Current Pain Management: Electronic Signature(s) Signed: 05/31/2018  4:59:33 PM By: Alric Quan Entered By: Alric Quan on 05/26/2018 08:49:44 [name] ([id number]) -------------------------------------------------------------------------------- Patient/Caregiver Education Details [name]. Date of Service: 05/26/2018 8:30 AM Medical Record Number: [id number] Patient Account Number: [id number] Date of Birth/Gender: Dec 27, 1933 (84 y.o. F) Treating RN: Montey Hora Primary Care Physician: Loura Pardon Other Clinician: Referring Physician: Loura Pardon Treating Physician/Extender: Tito Dine in Treatment: 11 Education Assessment Education Provided To: Patient and Caregiver Education Topics Provided Wound/Skin Impairment: Handouts: Other: wound care as ordered Methods: Demonstration, Explain/Verbal Responses: State content correctly Electronic Signature(s) Signed: 05/26/2018 5:29:24 PM By: Montey Hora Entered By: Montey Hora on 05/26/2018 11:28:47 [name] ([id number]) -------------------------------------------------------------------------------- Wound Assessment Details [name]. Date of Service: 05/26/2018 8:30 AM Medical Record Number: [id number] Patient Account Number: [id number] Date of Birth/Sex: 1934/05/19 (84 y.o. F) Treating RN: Ahmed Prima Primary Care Cartha Rotert: Loura Pardon Other Clinician: Referring Brysen Shankman: Loura Pardon Treating Kiearra Oyervides/Extender: Tito Dine in Treatment: 11 Wound Status Wound Number: 6 Primary Lesion Etiology: Wound Location: Left Knee Wound Status: Open Wounding Event: Gradually Appeared Comorbid Cataracts, Sleep Apnea, Arrhythmia, Date Acquired: 02/24/2018 History: Hypertension, Scleroderma Weeks Of Treatment: 11 Clustered Wound: No Photos Photo Uploaded By: Alric Quan on 05/27/2018 08:16:02 Wound Measurements Length: (cm) 0.9 Width: (cm) 0.4 Depth: (cm) 0.1 Area: (cm) 0.283 Volume: (cm) 0.028 %  Reduction in Area: 62.5% % Reduction in Volume: 62.7% Epithelialization: None Tunneling: No Undermining: No Wound Description Classification: Partial Thickness Wound Margin: Flat and Intact Exudate Amount: Medium Exudate Type: Serosanguineous Exudate Color: red, brown Foul Odor After Cleansing: No Slough/Fibrino No Wound Bed Granulation Amount: Large (67-100%) Exposed Structure Granulation Quality: Red Fascia Exposed: No Necrotic Amount: Small (1-33%) Fat Layer (Subcutaneous Tissue) Exposed: No Necrotic Quality: Eschar, Adherent Slough Tendon Exposed: No Muscle Exposed: No Joint Exposed: No Bone Exposed: No Periwound Skin Texture [name], [name] ([id number]) Texture Color No Abnormalities Noted: No No Abnormalities Noted: Yes Moisture Temperature / Pain No Abnormalities Noted: No Temperature: No Abnormality Tenderness on Palpation: Yes Wound Preparation Ulcer Cleansing: Rinsed/Irrigated with Saline Topical Anesthetic Applied: Other: lidocaine 4%, Treatment Notes Wound #6 (Left Knee) 1. Cleansed with: Clean wound with Normal Saline 2. Anesthetic Topical Lidocaine 4% cream to wound bed prior to debridement 4. Dressing Applied: Hydrafera Blue 5. Secondary [place] Notes Hydrofera Blue on anterior knee Electronic Signature(s) Signed: 05/31/2018 4:59:33 PM By: Alric Quan Entered By: Alric Quan on 05/26/2018 08:57:00 [name] ([id number]) -------------------------------------------------------------------------------- Wound Assessment Details [name]. Date of Service: 05/26/2018 8:30 AM Medical Record Number: [id number] Patient Account Number: [id number] Date of Birth/Sex: 1934-10-19 (84 y.o. F) Treating RN: Ahmed Prima Primary Care Klynn Linnemann: Loura Pardon Other Clinician: Referring Malcomb Gangemi: Loura Pardon Treating Herndon Grill/Extender: Tito Dine in Treatment: 11 Wound Status Wound Number: 8  Primary Infection - not elsewhere classified Etiology: Wound Location: Left Knee - Posterior Wound Status: Open Wounding Event: Surgical Injury Comorbid Cataracts, Sleep Apnea, Arrhythmia, Date Acquired: 02/24/2018 History: Hypertension, Scleroderma Weeks Of Treatment: 11 Clustered Wound: No Wound under treatment by Canary Fister outside of [place] Uploaded By: Alric Quan on 05/27/2018 08:16:03 Wound Measurements Length: (cm) 0.2 Width: (cm) 0.2 Depth: (cm) 1.3 Area: (cm) 0.031 Volume: (cm) 0.041 % Reduction in Area: 56.3% % Reduction in Volume: -  192.9% Epithelialization: None Tunneling: No Undermining: No Wound Description Classification: Partial Thickness Wound Margin: Flat and Intact Exudate Amount: Large Exudate Type: Serosanguineous Exudate Color: red, brown Foul Odor After Cleansing: No Slough/Fibrino No Wound Bed Granulation Amount: Large (67-100%) Exposed Structure Granulation Quality: Pink Fascia Exposed: No Necrotic Amount: None Present (0%) Fat Layer (Subcutaneous Tissue) Exposed: No Tendon Exposed: No Muscle Exposed: No Joint Exposed: No Bone Exposed: No Periwound Skin Texture [name], [name] ([id number]) Texture Color No Abnormalities Noted: No No Abnormalities Noted: No Callus: No Atrophie Blanche: No Crepitus: No Cyanosis: No Excoriation: No Ecchymosis: No Induration: No Erythema: No Rash: No Hemosiderin Staining: No Scarring: No Mottled: No Pallor: No Moisture Rubor: No No Abnormalities Noted: No Dry / Scaly: No Temperature / Pain Maceration: No Temperature: No Abnormality Tenderness on Palpation: Yes Wound Preparation Ulcer Cleansing: Rinsed/Irrigated with Saline Topical Anesthetic Applied: Other: lidocaine 4%, Treatment Notes Wound #8 (Left, Posterior Knee) 1. Cleansed with: Clean wound with Normal Saline 2. Anesthetic Topical Lidocaine 4% cream to wound bed prior to debridement 4. Dressing  Applied: Other dressing (specify in notes) 5. Secondary Dressing Applied Dry [place] Notes drawtex Electronic Signature(s) Signed: 05/31/2018 4:59:33 PM By: Alric Quan Entered By: Alric Quan on 05/26/2018 08:55:30 [name] ([id number]) -------------------------------------------------------------------------------- Vitals Details [name]. Date of Service: 05/26/2018 8:30 AM Medical Record Number: [id number] Patient Account Number: [id number] Date of Birth/Sex: [birth year] (84 y.o. F) Treating RN: Ahmed Prima Primary Care Cheyenne Schumm: Loura Pardon Other Clinician: Referring Xaidyn Kepner: Loura Pardon Treating Sherisse Fullilove/Extender: Tito Dine in Treatment: 11 Vital Signs Time Taken: 08:49 Temperature (F): 97.5 Height (in): 66 Pulse (bpm): 67 Respiratory Rate (breaths/min): 16 Blood Pressure (mmHg): 128/54 Reference Range: 80 - 120 mg / dl Electronic Signature(s) Signed: 05/31/2018 4:59:33 PM By: Alric Quan Entered By: Alric Quan on 05/26/2018 08:51:45

## 2018-06-26 ENCOUNTER — Emergency Department (HOSPITAL_COMMUNITY): Payer: Medicare Other

## 2018-06-26 ENCOUNTER — Emergency Department (HOSPITAL_COMMUNITY)
Admission: EM | Admit: 2018-06-26 | Discharge: 2018-06-27 | Disposition: A | Discharge status: Home / Self Care | Payer: Medicare Other | Attending: Emergency Medicine | Admitting: Emergency Medicine

## 2018-06-26 DIAGNOSIS — I10 Essential (primary) hypertension: Secondary | ICD-10-CM | POA: Diagnosis not present

## 2018-06-26 DIAGNOSIS — I471 Supraventricular tachycardia: Secondary | ICD-10-CM | POA: Diagnosis not present

## 2018-06-26 DIAGNOSIS — E039 Hypothyroidism, unspecified: Secondary | ICD-10-CM | POA: Insufficient documentation

## 2018-06-26 DIAGNOSIS — Z79899 Other long term (current) drug therapy: Secondary | ICD-10-CM | POA: Diagnosis not present

## 2018-06-26 DIAGNOSIS — R0789 Other chest pain: Secondary | ICD-10-CM | POA: Diagnosis not present

## 2018-06-26 DIAGNOSIS — R1013 Epigastric pain: Secondary | ICD-10-CM | POA: Diagnosis present

## 2018-06-26 DIAGNOSIS — R079 Chest pain, unspecified: Secondary | ICD-10-CM | POA: Insufficient documentation

## 2018-06-26 LAB — BASIC METABOLIC PANEL
Anion gap: 11 (ref 5–15)
BUN: 21 mg/dL (ref 8–23)
CHLORIDE: 96 mmol/L — AB (ref 98–111)
CO2: 30 mmol/L (ref 22–32)
CREATININE: 0.95 mg/dL (ref 0.44–1.00)
Calcium: 10.4 mg/dL — ABNORMAL HIGH (ref 8.9–10.3)
GFR calc Af Amer: >60 mL/min (ref >60)
GFR calc non Af Amer: 54 mL/min — ABNORMAL LOW (ref >60)
GLUCOSE: 104 mg/dL — AB (ref 70–99)
POTASSIUM: 4 mmol/L (ref 3.5–5.1)
SODIUM: 137 mmol/L (ref 135–145)

## 2018-06-26 LAB — CBC
HEMATOCRIT: 49.6 % — AB (ref 36.0–46.0)
HEMOGLOBIN: 15.8 g/dL — AB (ref 12.0–15.0)
MCH: 29.6 pg (ref 26.0–34.0)
MCHC: 31.9 g/dL (ref 30.0–36.0)
MCV: 92.9 fL (ref 78.0–100.0)
Platelets: 270 10*3/uL (ref 150–400)
RBC: 5.34 MIL/uL — AB (ref 3.87–5.11)
RDW: 13.2 % (ref 11.5–15.5)
WBC: 6.5 10*3/uL (ref 4.0–10.5)

## 2018-06-26 LAB — URINALYSIS, ROUTINE W REFLEX MICROSCOPIC
Bilirubin Urine: NEGATIVE
Glucose, UA: NEGATIVE mg/dL
Hgb urine dipstick: NEGATIVE
Ketones, ur: 20 mg/dL — AB
LEUKOCYTES UA: NEGATIVE
NITRITE: NEGATIVE
PH: 5 (ref 5.0–8.0)
Protein, ur: NEGATIVE mg/dL
Specific Gravity, Urine: 1.019 (ref 1.005–1.030)

## 2018-06-26 LAB — I-STAT TROPONIN, ED: TROPONIN I, POC: 0.01 ng/mL (ref 0.00–0.08)

## 2018-06-26 LAB — LIPASE, BLOOD: Lipase: 33 U/L (ref 11–51)

## 2018-06-26 NOTE — ED Triage Notes (Signed)
Pt reports chest discomfort "it could be indigestion but I just don't know."  Only other complaint was nausea and dizziness recently but not at this time.

## 2018-06-27 DIAGNOSIS — R0789 Other chest pain: Secondary | ICD-10-CM

## 2018-06-27 DIAGNOSIS — I471 Supraventricular tachycardia: Secondary | ICD-10-CM

## 2018-06-27 LAB — HEPATIC FUNCTION PANEL
ALT: 15 U/L (ref 0–44)
AST: 22 U/L (ref 15–41)
Albumin: 3.6 g/dL (ref 3.5–5.0)
Alkaline Phosphatase: 79 U/L (ref 38–126)
BILIRUBIN DIRECT: 0.2 mg/dL (ref 0.0–0.2)
Indirect Bilirubin: 0.9 mg/dL (ref 0.3–0.9)
Total Bilirubin: 1.1 mg/dL (ref 0.3–1.2)
Total Protein: 7.2 g/dL (ref 6.5–8.1)

## 2018-06-27 MED ORDER — SODIUM CHLORIDE 0.9 % IV BOLUS
500.0000 mL | Freq: Once | INTRAVENOUS | Status: AC
  Administered 2018-06-27: 500 mL via INTRAVENOUS

## 2018-06-27 MED ORDER — GI COCKTAIL ~~LOC~~
30.0000 mL | Freq: Once | ORAL | Status: AC
  Administered 2018-06-27: 30 mL via ORAL
  Filled 2018-06-27: qty 30

## 2018-06-27 MED ORDER — FAMOTIDINE 20 MG PO TABS: 20.0000 mg | ORAL_TABLET | Freq: Every day | ORAL | 0 refills | Status: DC

## 2018-06-27 NOTE — Consult Note (Signed)
Cardiology Consultation:   Patient ID: [name]; [id number]; 1934/02/23   Admit date: 06/26/2018 Date of Consult: 06/27/2018  Primary Care Provider: Abner Greenspan, MD Primary Electrophysiologist:  Dr. Joylene Grapes   Patient Profile:   [name] is a 84 y.o. female with a hx of PSVT s/p ablation, anemia, HTN, HLD and pulmonary HTN who is being seen today for the evaluation of chest pain/ irregular rhythm  at the request of Dr. Leonette Monarch.  History of Present Illness:   [name] presents with chest discomfort. The pain started in the epigastric region and was associated with nausea and a couple episodes of vomiting. The pain then started to radiate into her chest more. She has had the pain for about three days. She reports intermittent fever and chills. She denies any shortness of breath, diaphoresis, orthopnea, lower extremity edema or syncope. She does report some dizziness upon standing. She had a recent mechanical fall but no episode where she has passed out or lost consciousness. She was given a GI cocktail in the ED which has improved her chest discomfort. There was concern for new onset atrial fibrillation so cardiology was called.  Past Medical History:  Diagnosis Date  . Anemia   . Arthritis    knee  . Diverticulosis   . Hx of colonic polyps 10/24/2014  . Hyperlipidemia   . Hypertension   . Osteopenia   . Overactive bladder    urge incontinence  . PSVT (paroxysmal supraventricular tachycardia) ([place])   . Pulmonary emboli ([place])   . Pulmonary hypertension ([place])   . Right ventricular failure ([place])   . Sleep apnea    CPAP  . Spinal stenosis     Past Surgical History:  Procedure Laterality Date  . BLADDER SUSPENSION  1993  . CARDIAC CATHETERIZATION N/A 02/28/2015   Procedure: Right/Left Heart Cath and Coronary Angiography;  Surgeon: Troy Sine, MD;  Location: [place] INVASIVE CV LAB CUPID;  Service: Cardiovascular;  Laterality: N/A;  . CATARACT EXTRACTION Bilateral 2008  .  CHOLECYSTECTOMY    . COLONOSCOPY    . COLONOSCOPY N/A 10/24/2014   Procedure: COLONOSCOPY;  Surgeon: Gatha Mayer, MD;  [facility]: [facility];  Service: Endoscopy;  Laterality: N/A;  . CYST REMOVAL LEG Left 02/15/2016   cyst removal from back of left knee  . ELECTROPHYSIOLOGIC STUDY N/A 03/02/2015   Procedure: SVT Ablation;  Surgeon: Evans Lance, MD;  Location: [place] CV LAB;  Service: Cardiovascular;  Laterality: N/A;  . INCISIONAL HERNIA REPAIR    . RIGHT HEART CATHETERIZATION N/A 07/04/2014   Procedure: RIGHT HEART CATH;  Surgeon: Jolaine Artist, MD;  Location: [facility] CATH LAB;  Service: Cardiovascular;  Laterality: N/A;  . TONSILLECTOMY AND ADENOIDECTOMY    . TOTAL KNEE ARTHROPLASTY Left 2000  . TOTAL KNEE ARTHROPLASTY Left 10/29/2012   at [facility]  . TOTAL KNEE ARTHROPLASTY Right 03/09/2014  . TOTAL KNEE REVISION  01/12/2012   Procedure: TOTAL KNEE REVISION;  Surgeon: Kerin Salen, MD;  Location: [facility];  Service: Orthopedics;  Laterality: Left;  DEPUY/ LCS , HAND SET  . TRIGGER FINGER RELEASE  01/12/2012   Procedure: RELEASE TRIGGER FINGER/A-1 PULLEY;  Surgeon: Kerin Salen, MD;  Location: [facility];  Service: Orthopedics;  Laterality: Right;     Allergies:    Allergies  Allergen Reactions  . Prednisone Other (See Comments)    hyperctivity  . Zithromax [Azithromycin] Other (See Comments)    Prolonged QT on EKG  . Penicillins  Hives and Swelling    Questionable allergy (?) Currently on cefazolin, tolerating well since 4/21 Patient is tolerating dicloxacillin without issue. No skin changes or other concerns.   . Sulfonamide Derivatives Hives  . Adhesive [Tape] Other (See Comments) and Rash    redness    Social History:   Social History   Socioeconomic History  . Marital status: Widowed    Spouse name: Not on file  . Number of children: 3  . Years of education: Not on file  . Highest education level: Not on file  Occupational History  . Occupation: Retired    Fish farm manager:  RETIRED  Social Needs  . Financial resource strain: Not on file  . Food insecurity:    Worry: Not on file    Inability: Not on file  . Transportation needs:    Medical: Not on file    Non-medical: Not on file  Tobacco Use  . Smoking status: Never Smoker  . Smokeless tobacco: Never Used  Substance and Sexual Activity  . Alcohol use: No    Alcohol/week: 0.0 standard drinks  . Drug use: No  . Sexual activity: Never  Lifestyle  . Physical activity:    Days per week: Not on file    Minutes per session: Not on file  . Stress: Not on file  Relationships  . Social connections:    Talks on phone: Not on file    Gets together: Not on file    Attends religious service: Not on file    Active member of club or organization: Not on file    Attends meetings of clubs or organizations: Not on file    Relationship status: Not on file  . Intimate partner violence:    Fear of current or ex partner: Not on file    Emotionally abused: Not on file    Physically abused: Not on file    Forced sexual activity: Not on file  Other Topics Concern  . Not on file  Social History Narrative   Pt lives alone but dtr lives on property and has been staying with her since surgery.     Family History:   Family History  Problem Relation Age of Onset  . Breast cancer Mother 37  . Heart disease Father   . Diabetes Father   . Hypertension Father   . Uterine cancer Sister      REVIEW OF SYSTEMS (positive in bold, otherwise negative):  Constitutional: change in weight, fever/chills, fatigue, diaphoresis Skin: change in color, rashes, ulcerations or suspicious lesions HENT: head trauma, headache, difficulty hearing, nasal discharge, sore throat Eyes: blurred vision, diplopia Cardiovascular: chest pain, palpitations, syncope, edema, orthopnea, PND Respiratory: pleuritic chest pain, SOB, DOE, cough Gastrointestinal: constipation, abdominal pain, change in stool, nausea, vomiting, diarrhea  Genitourinary:  dysuria, hematuria, incontinence, urgency Musculoskeletal: arthritis, muscle stiffness, weakness. Neurological: seizures, focal weakness, sensory change  Physical Exam/Data:   Vitals:   06/26/18 1925 06/26/18 2135 06/26/18 2336  BP: 111/86 140/60 (!) 144/102  Pulse: 74 81 (!) 109  Resp: 18 14 18   Temp: 98.6 F (37 C)  97.6 F (36.4 C)  TempSrc: Oral  Oral  SpO2: 99% 93% 94%    General: Alert, cooperative, no distress, appears stated age. Elderly female  Head: Normocephalic, without obvious abnormality, atraumatic.  Eyes: Conjunctivae/corneas clear. PERRL, EOMs intact.   Nose: Nares normal.  Septum midline, Mucosa normal. No drainage or sinus tenderness.  Throat: Lips, mucosa, and tongue normal.  Teeth and gums normal.  Neck: Supple, symmetrical, trachea midline, no adenopathy, no thryroid enlargment, tenderness or nodules, no carotid bruit and no JVD  Lungs: Clear to auscultation bilaterally.  Heart: Regular rate and rhythm, S1, S2 normal, no murmur, click, rub or gallop  Chest Wall: No tenderness.  Abdomen: Soft, non-tender.  Bowel sounds normal.  No masses. No organomegaly.  Extremities: Extremities normal, atraumatic, no cyanosis or edema  Skin: Skin color, texture, turgor normal.  No rashes or lesions.  Neurologic: Nonfocal   Relevant CV Studies: EKG:  Sinus rhythm with frequent PAC's  Laboratory Data: Recent Labs    06/26/18 1928  WBC 6.5  HGB 15.8*  HCT 49.6*  PLT 270   Recent Labs    06/26/18 1928  NA 137  K 4.0  CL 96*  CO2 30  BUN 21  CREATININE 0.95   Recent Labs    06/27/18 0120  AST 22  ALT 15  ALKPHOS 79  BILITOT 1.1   Lab Results  Component Value Date   TSH 4.45 01/12/2018    Recent Labs  Lab 06/26/18 1943  TROPIPOC 0.01     Radiology/Studies:  Dg Chest 2 View  Result Date: 06/26/2018 CLINICAL DATA:  Pt reports chest discomfort "it could be indigestion but I just don't know." Only other complaint was nausea and dizziness  recently but not at this time EXAM: CHEST - 2 VIEW COMPARISON:  05/01/2016 FINDINGS: Heart size is normal. There is tortuosity of the thoracic aorta. There is atherosclerotic calcification at the arch. There is minimal bibasilar atelectasis. No focal consolidations. No pleural effusions or pulmonary edema. There is mild midthoracic spondylosis. IMPRESSION: No evidence for acute cardiopulmonary abnormality. Aortic atherosclerosis. (ICD10-I70.0) Electronically Signed   By: Nolon Nations M.D.   On: 06/26/2018 20:14    Assessment and Plan:   1. Irregular Rhythm - This appears to be sinus rhythm with frequent PAC's and occasional PVC's - No role for anticoagulation - Continue low dose metoprolol 12. 5 mg BID - Keep regular follow up with Dr. Rayann Heman  2. Chest Pain - Likely GI in origin, improved with GI cocktail - Consider repeat troponin at 6 hour mark - No further provocative testing at this point  For questions or updates, please contact [facility] Please consult [email or web address] for contact info under Cardiology/STEMI.   Signed, Roselyn Meier, MD  06/27/2018 4:10 AM

## 2018-06-27 NOTE — ED Provider Notes (Signed)
[facility] EMERGENCY DEPARTMENT Provider Note  CSN: [id number] Arrival date & time: 06/26/18 1902  Chief Complaint(s) Chest Pain points to epigastrum  HPI [name] is a 84 y.o. female   The history is provided by a relative.  Abdominal Pain   This is a new problem. Episode onset: 3 days. The problem occurs constantly. The problem has not changed (fluctuating) since onset.The pain is associated with an unknown factor. The pain is located in the epigastric region. The quality of the pain is aching, sharp and dull. The pain is moderate. Associated symptoms include anorexia, nausea and constipation. Pertinent negatives include fever, diarrhea, hematochezia, melena, vomiting and dysuria. Nothing aggravates the symptoms. Nothing relieves the symptoms.   Remainder of history, ROS, and physical exam limited due to patient's condition (memory deficits). Additional information was obtained from family.   Level V Caveat.   Past Medical History Past Medical History:  Diagnosis Date  . Anemia   . Arthritis    knee  . Diverticulosis   . Hx of colonic polyps 10/24/2014  . Hyperlipidemia   . Hypertension   . Osteopenia   . Overactive bladder    urge incontinence  . PSVT (paroxysmal supraventricular tachycardia) ([place])   . Pulmonary emboli ([place])   . Pulmonary hypertension ([employer])   . Right ventricular failure ([place])   . Sleep apnea    CPAP  . Spinal stenosis    Patient Active Problem List   Diagnosis Date Noted  . Left knee pain 04/07/2018  . Cor pulmonale (chronic) ([place]) 02/08/2018  . ILD (interstitial lung disease) ([place]) 10/20/2017  . Hypothyroid 07/03/2017  . B12 deficiency 07/03/2017  . Encounter for audiology evaluation 07/03/2017  . Encounter for screening mammogram for breast cancer 07/03/2017  . Tearfulness 06/28/2017  . Memory loss 06/28/2017  . Poor balance 09/26/2016  . Neck pain 07/30/2016  . Diarrhea 07/30/2016  . Routine general medical  examination at a health care facility 06/26/2016  . Elevated TSH 06/25/2016  . Generalized anxiety disorder 06/04/2016  . Intertrigo 06/04/2016  . Chronic cough 05/01/2016  . Constipation 01/09/2016  . Umbilical hernia 93/26/7124  . Chronic respiratory failure ([place]) 10/05/2015  . Thromboembolism of vein 10/02/2015  . Infection of prosthetic joint ([place]) 09/18/2015  . Infection of prosthetic left knee joint ([place]) 09/12/2015  . Effusion of knee 08/30/2015  . Other chest pain   . Pulmonary hypertension ([place])   . Physical deconditioning 02/25/2015  . RBBB-new 02/12/2015  . History of bilat PE after TKR (off Coumadin Dec 2015) 02/12/2015  . Abnormal EKG-TWI, RBBB, prolonged QT   . Hernia, umbilical 58/06/9832  . Hx of colonic polyps 10/24/2014  . Diverticulosis of colon without hemorrhage   . OSA (obstructive sleep apnea) 07/22/2014  . Unspecified arthropathy, lower leg 03/21/2014  . Status post surgery 03/15/2014  . Arthritis of knee 03/09/2014  . Solitary pulmonary nodule 05/03/2013  . Sternal fracture 04/26/2013  . History of DVT (deep vein thrombosis) 11/24/2012  . History of artificial joint 10/29/2012  . 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 Complications due to internal joint prosthesis ([place]) 09/07/2012  . PES PLANUS 08/15/2010  . URINARY FREQUENCY, CHRONIC 07/14/2008  . Dyslipidemia 07/09/2007  . Essential hypertension 07/09/2007  . PSVT (paroxysmal supraventricular tachycardia) ([place]) 02/23/2007  . DEGENERATIVE JOINT DISEASE 02/23/2007  . SPINAL STENOSIS, LUMBAR 02/23/2007  . History of osteopenia 02/23/2007  . SPONDYLOLISTHESIS 02/23/2007   Home Medication(s) Prior to Admission medications   Medication Sig Start Date End  Date Taking? Authorizing Provider  benzonatate (TESSALON) 200 MG capsule Take 1 capsule (200 mg total) by mouth 2 (two) times daily as needed for cough. 11/24/17  Yes Rigoberto Noel, MD  busPIRone (BUSPAR) 15 MG tablet Take 0.5 tablets (7.5 mg total) by  mouth 2 (two) times daily. 06/02/18  Yes Tower, Wynelle Fanny, MD  Calcium Carb-Cholecalciferol (CALCIUM 600/VITAMIN D3) 600-800 MG-UNIT TABS Take 1 tablet by mouth 2 (two) times daily.   Yes [provider]  cephALEXin (KEFLEX) 500 MG capsule Take 500 mg by mouth 3 (three) times daily.  06/12/17  Yes [provider]  Cholecalciferol (VITAMIN D-1000 MAX ST) 1000 UNITS tablet Take 1,000 Units by mouth daily. 09/08/15  Yes [provider]  FLUoxetine (PROZAC) 40 MG capsule Take 1 capsule (40 mg total) by mouth daily. 04/07/18  Yes Tower, Wynelle Fanny, MD  hydrochlorothiazide (HYDRODIURIL) 25 MG tablet TAKE 1 TABLET DAILY 07/09/17  Yes Tower, Wynelle Fanny, MD  HYDROcodone-acetaminophen (NORCO/VICODIN) 5-325 MG tablet Take 1 tablet by mouth every 4 (four) hours as needed for moderate pain. 06/04/18  Yes Cuthriell, Charline Bills, PA-C  levothyroxine (SYNTHROID, LEVOTHROID) 25 MCG tablet Take 1 tablet (25 mcg total) by mouth daily before breakfast. 10/19/17  Yes Tower, Wynelle Fanny, MD  metoprolol tartrate (LOPRESSOR) 25 MG tablet TAKE ONE-HALF (1/2) OF A TABLET BY MOUTHTWICE DAILY *PT DUE FOR APPT,MUST CALL TO SCHEDULE [phone]!* 04/08/17  Yes Allred, Jeneen Rinks, MD  naproxen sodium (ANAPROX) 220 MG tablet Take 220 mg by mouth daily as needed (pain).    Yes [provider]  OXYGEN Inhale 2 L into the lungs at bedtime.   Yes [provider]  Polyethyl Glycol-Propyl Glycol (SYSTANE) 0.4-0.3 % SOLN Apply 1 drop to eye 2 (two) times daily.   Yes [provider]                                                                                                                                    Past Surgical History Past Surgical History:  Procedure Laterality Date  . BLADDER SUSPENSION  1993  . CARDIAC CATHETERIZATION N/A 02/28/2015   Procedure: Right/Left Heart Cath and Coronary Angiography;  Surgeon: Troy Sine, MD;  Location: [facility] INVASIVE CV LAB CUPID;  Service: Cardiovascular;   Laterality: N/A;  . CATARACT EXTRACTION Bilateral 2008  . CHOLECYSTECTOMY    . COLONOSCOPY    . COLONOSCOPY N/A 10/24/2014   Procedure: COLONOSCOPY;  Surgeon: Gatha Mayer, MD;  [facility];  Service: Endoscopy;  Laterality: N/A;  . CYST REMOVAL LEG Left 02/15/2016   cyst removal from back of left knee  . ELECTROPHYSIOLOGIC STUDY N/A 03/02/2015   Procedure: SVT Ablation;  Surgeon: Evans Lance, MD;  Location: [facility] CV LAB;  Service: Cardiovascular;  Laterality: N/A;  . INCISIONAL HERNIA REPAIR    . RIGHT HEART CATHETERIZATION N/A 07/04/2014   Procedure: RIGHT HEART CATH;  Surgeon: Quillian Quince  R Bensimhon, MD;  Location: [facility] CATH LAB;  Service: Cardiovascular;  Laterality: N/A;  . TONSILLECTOMY AND ADENOIDECTOMY    . TOTAL KNEE ARTHROPLASTY Left 2000  . TOTAL KNEE ARTHROPLASTY Left 10/29/2012   at [facility]  . TOTAL KNEE ARTHROPLASTY Right 03/09/2014  . TOTAL KNEE REVISION  01/12/2012   Procedure: TOTAL KNEE REVISION;  Surgeon: Kerin Salen, MD;  Location: [facility];  Service: Orthopedics;  Laterality: Left;  DEPUY/ LCS , HAND SET  . TRIGGER FINGER RELEASE  01/12/2012   Procedure: RELEASE TRIGGER FINGER/A-1 PULLEY;  Surgeon: Kerin Salen, MD;  Location: [facility];  Service: Orthopedics;  Laterality: Right;   Family History Family History  Problem Relation Age of Onset  . Breast cancer Mother 38  . Heart disease Father   . Diabetes Father   . Hypertension Father   . Uterine cancer Sister     Social History Social History   Tobacco Use  . Smoking status: Never Smoker  . Smokeless tobacco: Never Used  Substance Use Topics  . Alcohol use: No    Alcohol/week: 0.0 standard drinks  . Drug use: No   Allergies Prednisone; Zithromax [azithromycin]; Penicillins; Sulfonamide derivatives; and Adhesive [tape]  Review of Systems Review of Systems  Constitutional: Negative for fever.  Gastrointestinal: Positive for abdominal pain, anorexia, constipation and nausea. Negative for diarrhea,  hematochezia, melena and vomiting.  Genitourinary: Negative for dysuria.   All other systems are reviewed and are negative for acute change except as noted in the HPI  Physical Exam Vital Signs  I have reviewed the triage vital signs BP (!) 144/102 (BP Location: Left Arm)   Pulse (!) 109   Temp 97.6 F (36.4 C) (Oral)   Resp 18   SpO2 94%   Physical Exam  Constitutional: She is oriented to person, place, and time. She appears well-developed and well-nourished. No distress.  HENT:  Head: Normocephalic and atraumatic.  Nose: Nose normal.  Eyes: Pupils are equal, round, and reactive to light. Conjunctivae and EOM are normal. Right eye exhibits no discharge. Left eye exhibits no discharge. No scleral icterus.  Neck: Normal range of motion. Neck supple.  Cardiovascular: Normal rate and regular rhythm. Exam reveals no gallop and no friction rub.  No murmur heard. Pulmonary/Chest: Effort normal and breath sounds normal. No stridor. No respiratory distress. She has no rales.  Abdominal: Soft. She exhibits no distension. There is tenderness (mild discomfort) in the epigastric area. There is no rigidity, no rebound and no guarding.  Musculoskeletal: She exhibits no edema or tenderness.  Neurological: She is alert and oriented to person, place, and time.  Skin: Skin is warm and dry. No rash noted. She is not diaphoretic. No erythema.  Psychiatric: She has a normal mood and affect.  Vitals reviewed.   ED Results and Treatments Labs (all labs ordered are listed, but only abnormal results are displayed) Labs Reviewed  BASIC METABOLIC PANEL - Abnormal; Notable for the following components:      Result Value   Chloride 96 (*)    Glucose, Bld 104 (*)    Calcium 10.4 (*)    GFR calc non Af Amer 54 (*)    All other components within normal limits  CBC - Abnormal; Notable for the following components:   RBC 5.34 (*)    Hemoglobin 15.8 (*)    HCT 49.6 (*)    All other components within  normal limits  URINALYSIS, ROUTINE W REFLEX MICROSCOPIC - Abnormal; Notable for the  following components:   Ketones, ur 20 (*)    All other components within normal limits  LIPASE, BLOOD  HEPATIC FUNCTION PANEL  I-STAT TROPONIN, ED                                                                                                                         EKG  EKG Interpretation  Date/Time:  Saturday June 26 2018 19:22:57 EDT Ventricular Rate:  77 PR Interval:  168 QRS Duration: 90 QT Interval:  324 QTC Calculation: 366 R Axis:   -26 Text Interpretation:  Sinus rhythm with Premature atrial complexes Nonspecific T wave abnormality Abnormal ECG NO STEMI Confirmed by Addison Lank [phone]) on 06/27/2018 12:07:34 AM        EKG Interpretation  Date/Time:  Sunday June 27 2018 01:56:16 EDT Ventricular Rate:  80 PR Interval:  168 QRS Duration: 92 QT Interval:  558 QTC Calculation: 581 R Axis:   -51 Text Interpretation:  Unknown rhythm, irregular rate Left anterior fascicular block Low voltage, precordial leads Abnormal R-wave progression, late transition Nonspecific T abnrm, anterolateral leads Prolonged QT interval Confirmed by Addison Lank [phone]) on 06/27/2018 2:14:01 AM       Radiology Dg Chest 2 View  Result Date: 06/26/2018 CLINICAL DATA:  Pt reports chest discomfort "it could be indigestion but I just don't know." Only other complaint was nausea and dizziness recently but not at this time EXAM: CHEST - 2 VIEW COMPARISON:  05/01/2016 FINDINGS: Heart size is normal. There is tortuosity of the thoracic aorta. There is atherosclerotic calcification at the arch. There is minimal bibasilar atelectasis. No focal consolidations. No pleural effusions or pulmonary edema. There is mild midthoracic spondylosis. IMPRESSION: No evidence for acute cardiopulmonary abnormality. Aortic atherosclerosis. (ICD10-I70.0) Electronically Signed   By: Nolon Nations M.D.   On: 06/26/2018 20:14    Pertinent labs & imaging results that were available during my care of the patient were reviewed by me and considered in my medical decision making (see chart for details).  Medications Ordered in ED Medications  gi cocktail (Maalox,Lidocaine,Donnatal) (30 mLs Oral Given 06/27/18 0125)  sodium chloride 0.9 % bolus 500 mL (0 mLs Intravenous Stopped 06/27/18 0238)                                                                                                                                    Procedures Procedures  (including critical  care time)  Medical Decision Making / ED Course I have reviewed the nursing notes for this encounter and the patient's prior records (if available in EHR or on provided paperwork).     EKG with intermittent irregular rhythm.  New onset.  Chads vas score of 5.  No evidence of acute ischemia or pericarditis.  On review of systems patient has a history of paroxysmal SVT requiring ablation.  Symptoms appear to be more GI related however given EKG changes, cardiac markers were obtained.  Negative troponin.  Rest of the labs revealed evidence of hemoconcentration and the CBC.  No significant electrolyte derangements or renal insufficiency.  No evidence of biliary obstructionor pancreatitis.  Patient was provided with a GI cocktail resulting in complete resolution of her discomfort.  Chest x-ray without evidence suggestive of pneumonia, pneumothorax, pneumomediastinum.  No abnormal contour of the mediastinum to suggest dissection. No evidence of acute injuries.  Given the fluctuating cardiac rhythm, will discuss case with cardiology.  Cardiology evaluated the patient in the emergency department.  They assessed the patient's telemetry and noted that the patient has frequent varying PACs with PVCs.  They felt that she was stable for discharge home with close cardiology follow-up.  No need for anticoagulation at this time.   Final Clinical Impression(s) / ED  Diagnoses Final diagnoses:  Epigastric discomfort    Disposition: Discharge  Condition: Good  I have discussed the results, Dx and Tx plan with the patient who expressed understanding and agree(s) with the plan. Discharge instructions discussed at great length. The patient was given strict return precautions who verbalized understanding of the instructions. No further questions at time of discharge.    ED Discharge Orders    None       Follow Up: Tower, Wynelle Fanny, MD [place] [address] [phone]  Schedule an appointment as soon as possible for a visit  As needed  Thompson Grayer, [place] [address] [place] [address] [phone]  Schedule an appointment as soon as possible for a visit       This chart was dictated using voice recognition software.  Despite best efforts to proofread,  errors can occur which can change the documentation meaning.   Fatima Blank, MD 06/27/18 [phone]

## 2018-06-27 NOTE — ED Notes (Signed)
Delay in lab draw edp at bedside. 

## 2018-06-29 ENCOUNTER — Telehealth: Payer: Self-pay

## 2018-06-29 NOTE — Telephone Encounter (Signed)
Per chart review tab pt went to [facility] ED on 06/26/18.

## 2018-06-29 NOTE — Telephone Encounter (Signed)
PLEASE NOTE: All timestamps contained within this report are represented as [place] Standard Time. CONFIDENTIALTY NOTICE: This fax transmission is intended only for the addressee. It contains information that is legally privileged, confidential or otherwise protected from use or disclosure. If you are not the intended recipient, you are strictly prohibited from reviewing, disclosing, copying using or disseminating any of this information or taking any action in reliance on or regarding this information. If you have received this fax in error, please notify us immediately by telephone so that we can arrange for its return to [place]. Phone: [phone], Toll-Free: [phone], Fax: [phone] Page: 1 of 2 Call Id: [id number] [place] [name] Gender: Female DOB: 1934/09/15 Age: 84 Y 55 M 20 D Return Phone Number: [phone] (Primary) Address: City/State/Zip: [place] [address] Client [facility] Night - Client Client Site [facility], Roque Lias - MD Contact Type Call Who Is Calling Patient / Member / Family / Caregiver Call Type Triage / Clinical Caller Name Federico Flake Relationship To Patient Daughter Return Phone Number [phone] (Primary) Chief Complaint Indigestion/Acid Reflux Reason for Call Symptomatic / Request for Foreman states that her mother has been having indigestion and nausea for the last 3 days. She is not eating. Tums has not helped Translation No Nurse Assessment Nurse: Mills-Hernandez, RN, Izora Gala Date/Time (Eastern Time): 06/26/2018 1:11:50 PM Confirm and document reason for call. If symptomatic, describe symptoms. ---Caller states that her mother has been having indigestion and nausea for the last 3 days. She is not eating. Tums has not helped Does  the patient have any new or worsening symptoms? ---Yes Will a triage be completed? ---Yes Related visit to physician within the last 2 weeks? ---Yes Does the PT have any chronic conditions? (i.e. diabetes, asthma, this includes High risk factors for pregnancy, etc.) ---Yes List chronic conditions. ---HTN, Depression Is this a behavioral health or substance abuse call? ---No Guidelines Guideline Title Affirmed Question Affirmed Notes Nurse Date/Time Eilene Ghazi Time) Abdominal Pain - Upper [1] MILD-MODERATE pain AND [2] not relieved by antacids Mills-Hernandez, RN, Izora Gala 06/26/2018 1:14:33 PM Disp. Time Eilene Ghazi Time) Disposition Final User 06/26/2018 1:17:55 PM See HCP within 4 Hours (or PCP triage) Yes Mills-Hernandez, RN, Cindee Salt Disagree/Comply Comply PLEASE NOTE: All timestamps contained within this report are represented as [place] Standard Time. CONFIDENTIALTY NOTICE: This fax transmission is intended only for the addressee. It contains information that is legally privileged, confidential or otherwise protected from use or disclosure. If you are not the intended recipient, you are strictly prohibited from reviewing, disclosing, copying using or disseminating any of this information or taking any action in reliance on or regarding this information. If you have received this fax in error, please notify us immediately by telephone so that we can arrange for its return to [place]. Phone: [phone], Toll-Free: [phone], Fax: [phone] Page: 2 of 2 Call Id: [id number] [place] Understands Yes PreDisposition Did not know what to do Care Advice Given Per Guideline * IF OFFICE WILL BE CLOSED AND NO PCP (PRIMARY CARE PROVIDER) SECOND-LEVEL TRIAGE: You need to be seen within the next 3 or 4 hours. A nearby Ur[facility]) is often a good source of care. Another choice is to go to the ED. Go sooner if you become worse. CALL BACK IF: * You become worse. CARE ADVICE given per  Abdominal Pain,  Upper (Adult) guideline. SEE HCP WITHIN 4 HOURS (OR PCP TRIAGE): Referrals [facility] [place]

## 2018-06-30 ENCOUNTER — Encounter: Payer: Self-pay | Admitting: Internal Medicine

## 2018-06-30 ENCOUNTER — Ambulatory Visit (INDEPENDENT_AMBULATORY_CARE_PROVIDER_SITE_OTHER): Payer: Medicare Other | Admitting: Internal Medicine

## 2018-06-30 VITALS — BP 124/68 | HR 74 | Ht 66.0 in | Wt 209.0 lb

## 2018-06-30 DIAGNOSIS — I491 Atrial premature depolarization: Secondary | ICD-10-CM

## 2018-06-30 DIAGNOSIS — Z87898 Personal history of other specified conditions: Secondary | ICD-10-CM

## 2018-06-30 NOTE — Progress Notes (Signed)
PCP: Abner Greenspan, MD   Primary EP: Dr Rayann Heman  [name] is a 84 y.o. female who presents today for routine electrophysiology followup. She presented to the ED 06/27/18 with palpitations and chest pain.  She was found to have sinus with PACs.  She was placed on low dose metoprolol and advised to follow-up with me.   Today, she denies symptoms of palpitations, chest pain, shortness of breath,  lower extremity edema, dizziness, presyncope, or syncope.  The patient is otherwise without complaint today.   Past Medical History:  Diagnosis Date  . Anemia   . Arthritis    knee  . Diverticulosis   . Hx of colonic polyps 10/24/2014  . Hyperlipidemia   . Hypertension   . Osteopenia   . Overactive bladder    urge incontinence  . PSVT (paroxysmal supraventricular tachycardia) ([place])   . Pulmonary emboli ([place])   . Pulmonary hypertension ([place])   . Right ventricular failure ([place])   . Sleep apnea    CPAP  . Spinal stenosis    Past Surgical History:  Procedure Laterality Date  . BLADDER SUSPENSION  1993  . CARDIAC CATHETERIZATION N/A 02/28/2015   Procedure: Right/Left Heart Cath and Coronary Angiography;  Surgeon: Troy Sine, MD;  Location: [facility];  Service: Cardiovascular;  Laterality: N/A;  . CATARACT EXTRACTION Bilateral 2008  . CHOLECYSTECTOMY    . COLONOSCOPY    . COLONOSCOPY N/A 10/24/2014   Procedure: COLONOSCOPY;  Surgeon: Gatha Mayer, MD;  Location: [facility];  Service: Endoscopy;  Laterality: N/A;  . CYST REMOVAL LEG Left 02/15/2016   cyst removal from back of left knee  . ELECTROPHYSIOLOGIC STUDY N/A 03/02/2015   Procedure: SVT Ablation;  Surgeon: Evans Lance, MD;  Location: [facility];  Service: Cardiovascular;  Laterality: N/A;  . INCISIONAL HERNIA REPAIR    . RIGHT HEART CATHETERIZATION N/A 07/04/2014   Procedure: RIGHT HEART CATH;  Surgeon: Jolaine Artist, MD;  Location: [facility] CATH LAB;  Service: Cardiovascular;  Laterality: N/A;  .  TONSILLECTOMY AND ADENOIDECTOMY    . TOTAL KNEE ARTHROPLASTY Left 2000  . TOTAL KNEE ARTHROPLASTY Left 10/29/2012   at [facility]  . TOTAL KNEE ARTHROPLASTY Right 03/09/2014  . TOTAL KNEE REVISION  01/12/2012   Procedure: TOTAL KNEE REVISION;  Surgeon: Kerin Salen, MD;  Location: [facility];  Service: Orthopedics;  Laterality: Left;  DEPUY/ LCS , HAND SET  . TRIGGER FINGER RELEASE  01/12/2012   Procedure: RELEASE TRIGGER FINGER/A-1 PULLEY;  Surgeon: Kerin Salen, MD;  Location: [facility];  Service: Orthopedics;  Laterality: Right;    ROS- all systems are reviewed and negatives except as per HPI above  Current Outpatient Medications  Medication Sig Dispense Refill  . benzonatate (TESSALON) 200 MG capsule Take 1 capsule (200 mg total) by mouth 2 (two) times daily as needed for cough. 180 capsule 3  . busPIRone (BUSPAR) 15 MG tablet Take 0.5 tablets (7.5 mg total) by mouth 2 (two) times daily. 30 tablet 11  . Calcium Carb-Cholecalciferol (CALCIUM 600/VITAMIN D3) 600-800 MG-UNIT TABS Take 1 tablet by mouth 2 (two) times daily.    . cephALEXin (KEFLEX) 500 MG capsule Take 500 mg by mouth 3 (three) times daily.     . Cholecalciferol (VITAMIN D-1000 MAX ST) 1000 UNITS tablet Take 1,000 Units by mouth daily.    . famotidine (PEPCID) 20 MG tablet Take 1 tablet (20 mg total) by mouth daily. 30 tablet  0  . FLUoxetine (PROZAC) 40 MG capsule Take 1 capsule (40 mg total) by mouth daily. 30 capsule 11  . hydrochlorothiazide (HYDRODIURIL) 25 MG tablet TAKE 1 TABLET DAILY 90 tablet 3  . HYDROcodone-acetaminophen (NORCO/VICODIN) 5-325 MG tablet Take 1 tablet by mouth every 4 (four) hours as needed for moderate pain. 20 tablet 0  . levothyroxine (SYNTHROID, LEVOTHROID) 25 MCG tablet Take 1 tablet (25 mcg total) by mouth daily before breakfast. 30 tablet 11  . metoprolol tartrate (LOPRESSOR) 25 MG tablet TAKE ONE-HALF (1/2) OF A TABLET BY MOUTHTWICE DAILY *PT DUE FOR APPT,MUST CALL TO SCHEDULE [phone]* 15 tablet 0  .  naproxen sodium (ANAPROX) 220 MG tablet Take 220 mg by mouth daily as needed (pain).     . OXYGEN Inhale 2 L into the lungs at bedtime.    Vladimir Faster Glycol-Propyl Glycol (SYSTANE) 0.4-0.3 % SOLN Apply 1 drop to eye 2 (two) times daily.     No current facility-administered medications for this visit.     Physical Exam: Vitals:   06/30/18 1616  BP: 124/68  Pulse: 74  SpO2: 93%  Weight: 209 lb (94.8 kg)  Height: 5\' 6"  (1.676 m)    GEN- The patient is well appearing, alert and oriented x 3 today.   Head- normocephalic, atraumatic Eyes-  Sclera clear, conjunctiva pink Ears- hearing intact Oropharynx- clear Lungs- Clear to ausculation bilaterally, normal work of breathing Heart- Regular rate and rhythm, no murmurs, rubs or gallops, PMI not laterally displaced GI- soft, NT, ND, + BS Extremities- no clubbing, cyanosis, or edema  Wt Readings from Last 3 Encounters:  06/30/18 209 lb (94.8 kg)  06/04/18 210 lb (95.3 kg)  06/02/18 212 lb 4 oz (96.3 kg)    EKG tracing ordered today is personally reviewed and shows sinus rhythm with PACs  Assessment and Plan:  1. Atypical chest pain Resolved She does not wish to have additional evaluation Dr Self felt to be GI in nature  2. PACs/ palpitations Improved with metoprolol  3. SVT Resolved post ablation  4. Overweight Body mass index is 33.73 kg/m. Lifestyle modification is encouraged  Return to see EP PA in 2 months  Thompson Grayer MD, [facility]edic [facility] 06/30/2018 4:31 PM

## 2018-06-30 NOTE — Patient Instructions (Signed)
Medication Instructions:  Your physician recommends that you continue on your current medications as directed. Please refer to the Current Medication list given to you today.  Labwork: None ordered.  Testing/Procedures: None ordered.  Follow-Up: Your physician recommends that you schedule a follow-up appointment in:   [place] in 2 months  Any Other Special Instructions Will Be Listed Below (If Applicable).     If you need a refill on your cardiac medications before your next appointment, please call your pharmacy.

## 2018-07-02 ENCOUNTER — Telehealth: Payer: Self-pay | Admitting: Internal Medicine

## 2018-07-02 MED ORDER — METOPROLOL TARTRATE 25 MG PO TABS: 12.5000 mg | ORAL_TABLET | Freq: Two times a day (BID) | ORAL | 3 refills | Status: AC

## 2018-07-02 NOTE — Telephone Encounter (Signed)
New Message      Patient daughter called states that "Metoprolol" 25 mg is on her mother's chart and at this time she does not take that medication, Pls call and advise. [phone]

## 2018-07-02 NOTE — Telephone Encounter (Signed)
Call received from Pt's daughter.  Daughter states Pt has not been taking metoprolol.  Appears Pt may have run out of medication-overdue for yearly appt.  Notified daughter Dr. Rayann Heman would like Pt to be taking this medication.  Refilled medication.

## 2018-07-03 ENCOUNTER — Telehealth: Payer: Self-pay | Admitting: Family Medicine

## 2018-07-03 DIAGNOSIS — E785 Hyperlipidemia, unspecified: Secondary | ICD-10-CM

## 2018-07-03 DIAGNOSIS — E538 Deficiency of other specified B group vitamins: Secondary | ICD-10-CM

## 2018-07-03 DIAGNOSIS — E039 Hypothyroidism, unspecified: Secondary | ICD-10-CM

## 2018-07-03 DIAGNOSIS — I10 Essential (primary) hypertension: Secondary | ICD-10-CM

## 2018-07-03 NOTE — Telephone Encounter (Signed)
-----   Message from Eustace Pen, LPN sent at 06/30/7653  3:40 PM EDT ----- Regarding: Labs 9/10 Lab orders needed. Thank you.  Insurance:  [facility]

## 2018-07-05 ENCOUNTER — Other Ambulatory Visit: Payer: Self-pay | Admitting: Family Medicine

## 2018-07-06 ENCOUNTER — Ambulatory Visit (INDEPENDENT_AMBULATORY_CARE_PROVIDER_SITE_OTHER): Payer: Medicare Other

## 2018-07-06 ENCOUNTER — Ambulatory Visit: Payer: Self-pay

## 2018-07-06 ENCOUNTER — Ambulatory Visit (INDEPENDENT_AMBULATORY_CARE_PROVIDER_SITE_OTHER): Payer: Medicare Other | Admitting: Family Medicine

## 2018-07-06 ENCOUNTER — Encounter: Payer: Self-pay | Admitting: Family Medicine

## 2018-07-06 VITALS — BP 128/62 | HR 54 | Temp 98.0°F

## 2018-07-06 VITALS — BP 128/62 | HR 54 | Temp 98.0°F | Ht 66.0 in

## 2018-07-06 DIAGNOSIS — E538 Deficiency of other specified B group vitamins: Secondary | ICD-10-CM

## 2018-07-06 DIAGNOSIS — R0789 Other chest pain: Secondary | ICD-10-CM

## 2018-07-06 DIAGNOSIS — Z0000 Encounter for general adult medical examination without abnormal findings: Secondary | ICD-10-CM | POA: Diagnosis not present

## 2018-07-06 DIAGNOSIS — E785 Hyperlipidemia, unspecified: Secondary | ICD-10-CM

## 2018-07-06 DIAGNOSIS — I10 Essential (primary) hypertension: Secondary | ICD-10-CM

## 2018-07-06 DIAGNOSIS — K2970 Gastritis, unspecified, without bleeding: Secondary | ICD-10-CM | POA: Insufficient documentation

## 2018-07-06 DIAGNOSIS — E039 Hypothyroidism, unspecified: Secondary | ICD-10-CM

## 2018-07-06 DIAGNOSIS — K2900 Acute gastritis without bleeding: Secondary | ICD-10-CM

## 2018-07-06 DIAGNOSIS — F411 Generalized anxiety disorder: Secondary | ICD-10-CM

## 2018-07-06 DIAGNOSIS — Z23 Encounter for immunization: Secondary | ICD-10-CM | POA: Diagnosis not present

## 2018-07-06 DIAGNOSIS — R413 Other amnesia: Secondary | ICD-10-CM | POA: Diagnosis not present

## 2018-07-06 DIAGNOSIS — R200 Anesthesia of skin: Secondary | ICD-10-CM

## 2018-07-06 LAB — CBC WITH DIFFERENTIAL/PLATELET
BASOS ABS: 0.1 10*3/uL (ref 0.0–0.1)
BASOS PCT: 1.3 % (ref 0.0–3.0)
EOS ABS: 0.4 10*3/uL (ref 0.0–0.7)
Eosinophils Relative: 5.8 % — ABNORMAL HIGH (ref 0.0–5.0)
HEMATOCRIT: 44.2 % (ref 36.0–46.0)
HEMOGLOBIN: 14.6 g/dL (ref 12.0–15.0)
LYMPHS PCT: 12.3 % (ref 12.0–46.0)
Lymphs Abs: 0.8 10*3/uL (ref 0.7–4.0)
MCHC: 33 g/dL (ref 30.0–36.0)
MCV: 90 fl (ref 78.0–100.0)
MONOS PCT: 8.5 % (ref 3.0–12.0)
Monocytes Absolute: 0.6 10*3/uL (ref 0.1–1.0)
Neutro Abs: 4.7 10*3/uL (ref 1.4–7.7)
Neutrophils Relative %: 72.1 % (ref 43.0–77.0)
Platelets: 305 10*3/uL (ref 150.0–400.0)
RBC: 4.91 Mil/uL (ref 3.87–5.11)
RDW: 13.8 % (ref 11.5–15.5)
WBC: 6.6 10*3/uL (ref 4.0–10.5)

## 2018-07-06 LAB — LIPID PANEL
CHOL/HDL RATIO: 3
Cholesterol: 135 mg/dL (ref 0–200)
HDL: 42.9 mg/dL (ref >39.00)
LDL CALC: 73 mg/dL (ref 0–99)
NONHDL: 92.49
Triglycerides: 96 mg/dL (ref 0.0–149.0)
VLDL: 19.2 mg/dL (ref 0.0–40.0)

## 2018-07-06 LAB — COMPREHENSIVE METABOLIC PANEL
ALBUMIN: 3.5 g/dL (ref 3.5–5.2)
ALT: 10 U/L (ref 0–35)
AST: 13 U/L (ref 0–37)
Alkaline Phosphatase: 70 U/L (ref 39–117)
BILIRUBIN TOTAL: 0.4 mg/dL (ref 0.2–1.2)
BUN: 30 mg/dL — ABNORMAL HIGH (ref 6–23)
CALCIUM: 9.7 mg/dL (ref 8.4–10.5)
CHLORIDE: 98 meq/L (ref 96–112)
CO2: 35 mEq/L — ABNORMAL HIGH (ref 19–32)
CREATININE: 1.13 mg/dL (ref 0.40–1.20)
GFR: 48.92 mL/min — ABNORMAL LOW (ref >60.00)
Glucose, Bld: 120 mg/dL — ABNORMAL HIGH (ref 70–99)
Potassium: 4.1 mEq/L (ref 3.5–5.1)
Sodium: 140 mEq/L (ref 135–145)
Total Protein: 7.2 g/dL (ref 6.0–8.3)

## 2018-07-06 LAB — HEMOGLOBIN A1C: HEMOGLOBIN A1C: 5.6 % (ref 4.6–6.5)

## 2018-07-06 LAB — TSH: TSH: 4.86 u[IU]/mL — ABNORMAL HIGH (ref 0.35–4.50)

## 2018-07-06 LAB — VITAMIN B12: VITAMIN B 12: 894 pg/mL (ref 211–911)

## 2018-07-06 MED ORDER — ALPRAZOLAM 0.5 MG PO TABS: 0.5000 mg | ORAL_TABLET | Freq: Two times a day (BID) | ORAL | 0 refills | Status: DC | PRN

## 2018-07-06 NOTE — Patient Instructions (Signed)
[name] , Thank you for taking time to come for your Medicare Wellness Visit. I appreciate your ongoing commitment to your health goals. Please review the following plan we discussed and let me know if I can assist you in the future.   These are the goals we discussed: Goals    . Increase physical activity     Starting 07/06/2018, I will continue to participate in physical therapy for 60 minutes twice weekly and as directed by ortho surgeon.        This is a list of the screening recommended for you and due dates:  Health Maintenance  Topic Date Due  . Mammogram  07/21/2018  . Tetanus Vaccine  02/19/2022  . Flu Shot  Completed  . DEXA scan (bone density measurement)  Completed  . Pneumonia vaccines  Completed    [name] , Thank you for taking time to come for your Medicare Wellness Visit. I appreciate your ongoing commitment to your health goals. Please review the following plan we discussed and let me know if I can assist you in the future.   These are the goals we discussed: Goals    . Increase physical activity     Starting 07/06/2018, I will continue to participate in physical therapy for 60 minutes twice weekly and as directed by ortho surgeon.        This is a list of the screening recommended for you and due dates:  Health Maintenance  Topic Date Due  . Mammogram  07/21/2018  . Tetanus Vaccine  02/19/2022  . Flu Shot  Completed  . DEXA scan (bone density measurement)  Completed  . Pneumonia vaccines  Completed   Preventive Care for Adults  A healthy lifestyle and preventive care can promote health and wellness. Preventive health guidelines for adults include the following key practices.  . A routine yearly physical is a good way to check with your health care provider about your health and preventive screening. It is a chance to share any concerns and updates on your health and to receive a thorough exam.  . Visit your dentist for a routine exam and preventive  care every 6 months. Brush your teeth twice a day and floss once a day. Good oral hygiene prevents tooth decay and gum disease.  . The frequency of eye exams is based on your age, health, family medical history, use  of contact lenses, and other factors. Follow your health care provider's recommendations for frequency of eye exams.  . Eat a healthy diet. Foods like vegetables, fruits, whole grains, low-fat dairy products, and lean protein foods contain the nutrients you need without too many calories. Decrease your intake of foods high in solid fats, added sugars, and salt. Eat the right amount of calories for you. Get information about a proper diet from your health care provider, if necessary.  . Regular physical exercise is one of the most important things you can do for your health. Most adults should get at least 150 minutes of moderate-intensity exercise (any activity that increases your heart rate and causes you to sweat) each week. In addition, most adults need muscle-strengthening exercises on 2 or more days a week.  Silver Sneakers may be a benefit available to you. To determine eligibility, you may visit the website: www.silversneakers.com or contact program at 615-728-3197 Mon-Fri between 8AM-8PM.   . Maintain a healthy weight. The body mass index (BMI) is a screening tool to identify possible weight problems. It provides an  estimate of body fat based on height and weight. Your health care provider can find your BMI and can help you achieve or maintain a healthy weight.   For adults 20 years and older: ? A BMI below 18.5 is considered underweight. ? A BMI of 18.5 to 24.9 is normal. ? A BMI of 25 to 29.9 is considered overweight. ? A BMI of 30 and above is considered obese.   . Maintain normal blood lipids and cholesterol levels by exercising and minimizing your intake of saturated fat. Eat a balanced diet with plenty of fruit and vegetables. Blood tests for lipids and cholesterol  should begin at age 80 and be repeated every 5 years. If your lipid or cholesterol levels are high, you are over 50, or you are at high risk for heart disease, you may need your cholesterol levels checked more frequently. Ongoing high lipid and cholesterol levels should be treated with medicines if diet and exercise are not working.  . If you smoke, find out from your health care provider how to quit. If you do not use tobacco, please do not start.  . If you choose to drink alcohol, please do not consume more than 2 drinks per day. One drink is considered to be 12 ounces (355 mL) of beer, 5 ounces (148 mL) of wine, or 1.5 ounces (44 mL) of liquor.  . If you are 18-63 years old, ask your health care provider if you should take aspirin to prevent strokes.  . Use sunscreen. Apply sunscreen liberally and repeatedly throughout the day. You should seek shade when your shadow is shorter than you. Protect yourself by wearing long sleeves, pants, a wide-brimmed hat, and sunglasses year round, whenever you are outdoors.  . Once a month, do a whole body skin exam, using a mirror to look at the skin on your back. Tell your health care provider of new moles, moles that have irregular borders, moles that are larger than a pencil eraser, or moles that have changed in shape or color.

## 2018-07-06 NOTE — Progress Notes (Signed)
Subjective:   [name] is a 84 y.o. female who presents for Medicare Annual (Subsequent) preventive examination.  Review of Systems:  N/A Cardiac Risk Factors include: advanced age (>6men, >77 women);dyslipidemia;hypertension;obesity (BMI >30kg/m2)     Objective:     Vitals: BP 128/62   Pulse (!) 54   Temp 98 F (36.7 C)   SpO2 93%   There is no height or weight on file to calculate BMI.  Advanced Directives 06/04/2018 06/30/2017 10/23/2016 06/20/2016 01/23/2016 01/06/2016 10/05/2015  Does Patient Have a Medical Advance Directive? Yes Yes No Yes Yes No Yes  Type of Advance Directive Living will [place];Living will - [place];Living will Living will - [place];Living will  Does patient want to make changes to medical advance directive? No - Patient declined - - No - Patient declined - - -  Copy of [place] in Chart? - No - copy requested - No - copy requested No - copy requested - -  Would patient like information on creating a medical advance directive? - - - - - No - patient declined information -  Pre-existing out of facility DNR order (yellow form or pink MOST form) - - - - - - -    Tobacco Social History   Tobacco Use  Smoking Status Never Smoker  Smokeless Tobacco Never Used     Counseling given: No   Clinical Intake:  Pre-visit preparation completed: Yes  Pain : No/denies pain Pain Score: 0-No pain     Nutritional Status: BMI > 30  Obese Nutritional Risks: Non-healing wound(sees wound clinic for wounds to left knee) Diabetes: No  How often do you need to have someone help you when you read instructions, pamphlets, or other written materials from your doctor or pharmacy?: 1 - Never What is the last grade level you completed in school?: 12th grade  Interpreter Needed?: No  Comments: pt is a widow and lives alone Information entered by :: LPinson, LPN  Past Medical History:    Diagnosis Date  . Anemia   . Arthritis    knee  . Diverticulosis   . Hx of colonic polyps 10/24/2014  . Hyperlipidemia   . Hypertension   . Osteopenia   . Overactive bladder    urge incontinence  . PSVT (paroxysmal supraventricular tachycardia) ([place])   . Pulmonary emboli ([place])   . Pulmonary hypertension ([place])   . Right ventricular failure ([place])   . Sleep apnea    CPAP  . Spinal stenosis    Past Surgical History:  Procedure Laterality Date  . BLADDER SUSPENSION  1993  . CARDIAC CATHETERIZATION N/A 02/28/2015   Procedure: Right/Left Heart Cath and Coronary Angiography;  Surgeon: Troy Sine, MD;  Location: [facility];  Service: Cardiovascular;  Laterality: N/A;  . CATARACT EXTRACTION Bilateral 2008  . CHOLECYSTECTOMY    . COLONOSCOPY    . COLONOSCOPY N/A 10/24/2014   Procedure: COLONOSCOPY;  Surgeon: Gatha Mayer, MD;  Location: [facility];  Service: Endoscopy;  Laterality: N/A;  . CYST REMOVAL LEG Left 02/15/2016   cyst removal from back of left knee  . ELECTROPHYSIOLOGIC STUDY N/A 03/02/2015   Procedure: SVT Ablation;  Surgeon: Evans Lance, MD;  Location: [facility];  Service: Cardiovascular;  Laterality: N/A;  . INCISIONAL HERNIA REPAIR    . RIGHT HEART CATHETERIZATION N/A 07/04/2014   Procedure: RIGHT HEART CATH;  Surgeon: Shaune Pascal Bensimhon,  MD;  Location: [facility];  Service: Cardiovascular;  Laterality: N/A;  . TONSILLECTOMY AND ADENOIDECTOMY    . TOTAL KNEE ARTHROPLASTY Left 2000  . TOTAL KNEE ARTHROPLASTY Left 10/29/2012   at [facility]  . TOTAL KNEE ARTHROPLASTY Right 03/09/2014  . TOTAL KNEE REVISION  01/12/2012   Procedure: TOTAL KNEE REVISION;  Surgeon: Kerin Salen, MD;  Location: [facility];  Service: Orthopedics;  Laterality: Left;  DEPUY/ LCS , HAND SET  . TRIGGER FINGER RELEASE  01/12/2012   Procedure: RELEASE TRIGGER FINGER/A-1 PULLEY;  Surgeon: Kerin Salen, MD;  Location: [facility];  Service: Orthopedics;  Laterality: Right;   Family  History  Problem Relation Age of Onset  . Breast cancer Mother 27  . Heart disease Father   . Diabetes Father   . Hypertension Father   . Uterine cancer Sister    Social History   Socioeconomic History  . Marital status: Widowed    Spouse name: Not on file  . Number of children: 3  . Years of education: Not on file  . Highest education level: Not on file  Occupational History  . Occupation: Retired    Fish farm manager: RETIRED  Social Needs  . Financial resource strain: Not on file  . Food insecurity:    Worry: Not on file    Inability: Not on file  . Transportation needs:    Medical: Not on file    Non-medical: Not on file  Tobacco Use  . Smoking status: Never Smoker  . Smokeless tobacco: Never Used  Substance and Sexual Activity  . Alcohol use: No    Alcohol/week: 0.0 standard drinks  . Drug use: No  . Sexual activity: Never  Lifestyle  . Physical activity:    Days per week: Not on file    Minutes per session: Not on file  . Stress: Not on file  Relationships  . Social connections:    Talks on phone: Not on file    Gets together: Not on file    Attends religious service: Not on file    Active member of club or organization: Not on file    Attends meetings of clubs or organizations: Not on file    Relationship status: Not on file  Other Topics Concern  . Not on file  Social History Narrative   Pt lives alone but dtr lives on property and has been staying with her since surgery.    Outpatient Encounter Medications as of 07/06/2018  Medication Sig  . ALPRAZolam (XANAX) 0.5 MG tablet Take 1 tablet (0.5 mg total) by mouth 2 (two) times daily as needed for anxiety. Caution of sedation  . benzonatate (TESSALON) 200 MG capsule Take 1 capsule (200 mg total) by mouth 2 (two) times daily as needed for cough.  . Calcium Carb-Cholecalciferol (CALCIUM 600/VITAMIN D3) 600-800 MG-UNIT TABS Take 1 tablet by mouth 2 (two) times daily.  . cephALEXin (KEFLEX) 500 MG capsule Take 500  mg by mouth 3 (three) times daily.   . Cholecalciferol (VITAMIN D-1000 MAX ST) 1000 UNITS tablet Take 1,000 Units by mouth daily.  . famotidine (PEPCID) 20 MG tablet Take 1 tablet (20 mg total) by mouth daily.  Marland Kitchen FLUoxetine (PROZAC) 40 MG capsule Take 1 capsule (40 mg total) by mouth daily.  . hydrochlorothiazide (HYDRODIURIL) 25 MG tablet TAKE 1 TABLET DAILY  . levothyroxine (SYNTHROID, LEVOTHROID) 25 MCG tablet Take 1 tablet (25 mcg total) by mouth daily before breakfast.  . metoprolol tartrate (LOPRESSOR) 25 MG  tablet Take 0.5 tablets (12.5 mg total) by mouth 2 (two) times daily.  . naproxen sodium (ANAPROX) 220 MG tablet Take 220 mg by mouth daily as needed (pain).   . OXYGEN Inhale 2 L into the lungs at bedtime.  Vladimir Faster Glycol-Propyl Glycol (SYSTANE) 0.4-0.3 % SOLN Apply 1 drop to eye 2 (two) times daily.   No facility-administered encounter medications on file as of 07/06/2018.     Activities of Daily Living In your present state of health, do you have any difficulty performing the following activities: 07/06/2018  Hearing? Y  Vision? Y  Difficulty concentrating or making decisions? Y  Walking or climbing stairs? Y  Dressing or bathing? Y  Doing errands, shopping? Y  Preparing Food and eating ? Y  Using the Toilet? N  In the past six months, have you accidently leaked urine? Y  Do you have problems with loss of bowel control? Y  Managing your Medications? Y  Managing your Finances? Y  Housekeeping or managing your Housekeeping? Y  Some recent data might be hidden    Patient Care Team: Tower, Wynelle Fanny, MD as PCP - General    Assessment:   This is a routine wellness examination for [facility].  Exercise Activities and Dietary recommendations Current Exercise Habits: Structured exercise class, Type of exercise: stretching;strength training/weights, Time (Minutes): 60, Frequency (Times/Week): 2, Weekly Exercise (Minutes/Week): 120, Intensity: Mild, Exercise limited by:  orthopedic condition(s)  Goals    . Increase physical activity     Starting 07/06/2018, I will continue to participate in physical therapy for 60 minutes twice weekly and as directed by ortho surgeon.        Fall Risk Fall Risk  07/06/2018 06/30/2017 06/20/2016 07/14/2014 02/08/2013  Falls in the past year? Yes No Yes No No  Number falls in past yr: 2 or more - 1 - -  Injury with Fall? Yes - No - -  Risk Factor Category  High Fall Risk - - - -  Risk for fall due to : Impaired balance/gait;Impaired mobility;History of fall(s);Mental status change - History of fall(s);Impaired balance/gait;Impaired mobility - -  Follow up - - Falls evaluation completed - -   Depression Screen PHQ 2/9 Scores 07/06/2018 06/02/2018 04/07/2018 06/30/2017  PHQ - 2 Score 3 2 2 1   PHQ- 9 Score 10 5 11 6      Cognitive Function MMSE - Mini Mental State Exam 07/06/2018 06/30/2017 06/20/2016  Orientation to time 5 5 5   Orientation to Place 5 5 5   Registration 3 3 3   Attention/ Calculation 0 0 0  Recall 3 3 3   Language- name 2 objects 0 0 0  Language- repeat 1 1 1   Language- follow 3 step command 3 3 3   Language- read & follow direction 0 0 0  Write a sentence 0 0 0  Copy design 0 0 0  Total score 20 20 20      PLEASE NOTE: A Mini-Cog screen was completed. Maximum score is 20. A value of 0 denotes this part of [place] MMSE was not completed or the patient failed this part of the Mini-Cog screening.   Mini-Cog Screening Orientation to Time - Max 5 pts Orientation to Place - Max 5 pts Registration - Max 3 pts Recall - Max 3 pts Language Repeat - Max 1 pts Language Follow 3 Step Command - Max 3 pts     Immunization History  Administered Date(s) Administered  . Influenza Split 07/29/2011  . Influenza Whole 08/14/2005, 07/28/2007,  07/14/2008, 07/26/2009, 07/16/2010  . Influenza,inj,Quad PF,6+ Mos 07/19/2013, 07/14/2014, 07/11/2015, 06/25/2016, 07/03/2017, 07/06/2018  . Pneumococcal Conjugate-13 07/14/2014  .  Pneumococcal Polysaccharide-23 06/25/2006, 08/15/2010  . Td 03/27/2004, 02/20/2012  . Tdap 02/20/2012  . Zoster 11/01/2007    Screening Tests Health Maintenance  Topic Date Due  . MAMMOGRAM  07/21/2018  . TETANUS/TDAP  02/19/2022  . INFLUENZA VACCINE  Completed  . DEXA SCAN  Completed  . PNA vac Low Risk Adult  Completed       Plan:   I have personally reviewed, addressed, and noted the following in the patient's chart:  A. Medical and social history B. Use of alcohol, tobacco or illicit drugs  C. Current medications and supplements D. Functional ability and status E.  Nutritional status F.  Physical activity G. Advance directives H. List of other physicians I.  Hospitalizations, surgeries, and ER visits in previous 12 months J.  [place] to include hearing, vision, cognitive, depression L. Referrals and appointments - none  In addition, I have reviewed and discussed with patient certain preventive protocols, quality metrics, and best practice recommendations. A written personalized care plan for preventive services as well as general preventive health recommendations were provided to patient.  See attached scanned questionnaire for additional information.   Signed,   Lindell Noe, MHA, BS, LPN Health Coach

## 2018-07-06 NOTE — Progress Notes (Signed)
PCP notes:   Health maintenance:  Flu vaccine - administered  Abnormal screenings:   Fall risk - hx of multiple falls Fall Risk  07/06/2018 06/30/2017 06/20/2016 07/14/2014 02/08/2013  Falls in the past year? Yes No Yes No No  Number falls in past yr: 2 or more - 1 - -  Injury with Fall? Yes - No - -  Risk Factor Category  High Fall Risk - - - -  Risk for fall due to : Impaired balance/gait;Impaired mobility;History of fall(s);Mental status change - History of fall(s);Impaired balance/gait;Impaired mobility - -  Follow up - - Falls evaluation completed - -   Depression score: 10 Depression screen [facility] 2/9 07/06/2018 06/02/2018 04/07/2018 06/30/2017 06/20/2016  Decreased Interest 2 1 1 1 1   Down, Depressed, Hopeless 1 1 1  0 0  PHQ - 2 Score 3 2 2 1 1   Altered sleeping 1 0 1 1 -  Tired, decreased energy 2 2 2 2  -  Change in appetite 1 0 1 0 -  Feeling bad or failure about yourself  0 0 2 0 -  Trouble concentrating 2 1 2 1  -  Moving slowly or fidgety/restless 1 0 1 0 -  Suicidal thoughts 0 0 0 1 -  PHQ-9 Score 10 5 11 6  -  Difficult doing work/chores Very difficult Very difficult - Not difficult at all -  Some recent data might be hidden   Patient concerns:   None  Nurse concerns:  None  Next PCP appt:   07/09/2018 @ 0830  I reviewed health advisor's note, was available for consultation, and agree with documentation and plan. Loura Pardon MD

## 2018-07-06 NOTE — Patient Instructions (Signed)
Stop the buspar  Give xanax only as needed for severe anxiety - and watch for sedation or balance issues   Continue pepcid If nausea continues for 2-4 more weeks please let me know  Try a cracker for nausea   We will refer to neurology for memory issues and confusion   We will add A1C to labs today to screen for diabetes in light of foot issue

## 2018-07-06 NOTE — Progress Notes (Signed)
Subjective:    Patient ID: [name], female    DOB: Oct 16, 1934, 85 y.o.   MRN: [id number]  HPI  Here for f/u of ED visit 06/27/18 for abd/chest pain   EKG showed fluctuating rhythm - eval by cardiology and felt to be PAC and PVCs  Adv to keep f/u with Dr Rayann Heman Enzymes were negative  CXR normal  Complete resolution with GI cocktail  Labs showed hemoconcentration / also ketones in urine (likely dehydrated and given IVF)   Results for orders placed or performed during the hospital encounter of 38/25/05  Basic metabolic panel  Result Value Ref Range   Sodium 137 135 - 145 mmol/L   Potassium 4.0 3.5 - 5.1 mmol/L   Chloride 96 (L) 98 - 111 mmol/L   CO2 30 22 - 32 mmol/L   Glucose, Bld 104 (H) 70 - 99 mg/dL   BUN 21 8 - 23 mg/dL   Creatinine, Ser 0.95 0.44 - 1.00 mg/dL   Calcium 10.4 (H) 8.9 - 10.3 mg/dL   GFR calc non Af Amer 54 (L) >60 mL/min   GFR calc Af Amer >60 >60 mL/min   Anion gap 11 5 - 15  CBC  Result Value Ref Range   WBC 6.5 4.0 - 10.5 K/uL   RBC 5.34 (H) 3.87 - 5.11 MIL/uL   Hemoglobin 15.8 (H) 12.0 - 15.0 g/dL   HCT 49.6 (H) 36.0 - 46.0 %   MCV 92.9 78.0 - 100.0 fL   MCH 29.6 26.0 - 34.0 pg   MCHC 31.9 30.0 - 36.0 g/dL   RDW 13.2 11.5 - 15.5 %   Platelets 270 150 - 400 K/uL  Lipase, blood  Result Value Ref Range   Lipase 33 11 - 51 U/L  Urinalysis, Routine w reflex microscopic  Result Value Ref Range   Color, Urine YELLOW YELLOW   APPearance CLEAR CLEAR   Specific Gravity, Urine 1.019 1.005 - 1.030   pH 5.0 5.0 - 8.0   Glucose, UA NEGATIVE NEGATIVE mg/dL   Hgb urine dipstick NEGATIVE NEGATIVE   Bilirubin Urine NEGATIVE NEGATIVE   Ketones, ur 20 (A) NEGATIVE mg/dL   Protein, ur NEGATIVE NEGATIVE mg/dL   Nitrite NEGATIVE NEGATIVE   Leukocytes, UA NEGATIVE NEGATIVE  Hepatic function panel  Result Value Ref Range   Total Protein 7.2 6.5 - 8.1 g/dL   Albumin 3.6 3.5 - 5.0 g/dL   AST 22 15 - 41 U/L   ALT 15 0 - 44 U/L   Alkaline Phosphatase 79 38 -  126 U/L   Total Bilirubin 1.1 0.3 - 1.2 mg/dL   Bilirubin, Direct 0.2 0.0 - 0.2 mg/dL   Indirect Bilirubin 0.9 0.3 - 0.9 mg/dL  I-stat troponin, ED  Result Value Ref Range   Troponin i, poc 0.01 0.00 - 0.08 ng/mL   Comment 3            Dg Chest 2 View  Result Date: 06/26/2018 CLINICAL DATA:  Pt reports chest discomfort "it could be indigestion but I just don't know." Only other complaint was nausea and dizziness recently but not at this time EXAM: CHEST - 2 VIEW COMPARISON:  05/01/2016 FINDINGS: Heart size is normal. There is tortuosity of the thoracic aorta. There is atherosclerotic calcification at the arch. There is minimal bibasilar atelectasis. No focal consolidations. No pleural effusions or pulmonary edema. There is mild midthoracic spondylosis. IMPRESSION: No evidence for acute cardiopulmonary abnormality. Aortic atherosclerosis. (ICD10-I70.0) Electronically Signed   By: Benjamine Mola  Owens Shark M.D.   On: 06/26/2018 20:14    Was px pepcid 20 mg daily    Per note from daughter- still exhibiting paranoia / severe anxiety and confusion at times  Added buspar at last visit -not at all improved   Memory -is worse  Gets confused and then does not remember it   Sleeps well once asleep  Woke up last night - yelling for something- able to go back to sleep  Melatonin helps  Did make it to church on sunday   bp is stable today  No cp or palpitations or headaches or edema  No side effects to medicines  BP Readings from Last 3 Encounters:  07/06/18 128/62  06/30/18 124/68  06/26/18 (!) 144/102     Pulse Readings from Last 3 Encounters:  07/06/18 (!) 54  06/30/18 74  06/26/18 (!) 109   Was nauseated this am  No pain however  Thinks pepcid has helped overall  At a ham biscuit this am   [place] Myl[place]- daughter cut her toe nails and feet were numb   Lab Results  Component Value Date   HGBA1C 5.8 (H) 02/14/2015   No headaches  Ambulation-less steady   Patient Active Problem List    Diagnosis Date Noted  . Gastritis 07/06/2018  . Numbness in feet 07/06/2018  . Left knee pain 04/07/2018  . Cor pulmonale (chronic) ([place]) 02/08/2018  . ILD (interstitial lung disease) ([place]) 10/20/2017  . Hypothyroid 07/03/2017  . B12 deficiency 07/03/2017  . Encounter for audiology evaluation 07/03/2017  . Encounter for screening mammogram for breast cancer 07/03/2017  . Tearfulness 06/28/2017  . Memory loss 06/28/2017  . Poor balance 09/26/2016  . Neck pain 07/30/2016  . Diarrhea 07/30/2016  . Routine general medical examination at a health care facility 06/26/2016  . Elevated TSH 06/25/2016  . Generalized anxiety disorder 06/04/2016  . Intertrigo 06/04/2016  . Chronic cough 05/01/2016  . Constipation 01/09/2016  . Umbilical hernia 40/98/1191  . Chronic respiratory failure ([place]) 10/05/2015  . Thromboembolism of vein 10/02/2015  . Infection of prosthetic joint ([place]) 09/18/2015  . Infection of prosthetic left knee joint ([place]) 09/12/2015  . Effusion of knee 08/30/2015  . Pulmonary hypertension ([place])   . Physical deconditioning 02/25/2015  . RBBB-new 02/12/2015  . History of bilat PE after TKR (off Coumadin Dec 2015) 02/12/2015  . Abnormal EKG-TWI, RBBB, prolonged QT   . Hernia, umbilical 47/82/9562  . Hx of colonic polyps 10/24/2014  . Diverticulosis of colon without hemorrhage   . OSA (obstructive sleep apnea) 07/22/2014  . Unspecified arthropathy, lower leg 03/21/2014  . Status post surgery 03/15/2014  . Arthritis of knee 03/09/2014  . Solitary pulmonary nodule 05/03/2013  . Sternal fracture 04/26/2013  . History of DVT (deep vein thrombosis) 11/24/2012  . History of artificial joint 10/29/2012  . 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 Complications due to internal joint prosthesis ([place]) 09/07/2012  . PES PLANUS 08/15/2010  . URINARY FREQUENCY, CHRONIC 07/14/2008  . Dyslipidemia 07/09/2007  . Essential hypertension 07/09/2007  . PSVT (paroxysmal supraventricular  tachycardia) ([place]) 02/23/2007  . DEGENERATIVE JOINT DISEASE 02/23/2007  . SPINAL STENOSIS, LUMBAR 02/23/2007  . History of osteopenia 02/23/2007  . SPONDYLOLISTHESIS 02/23/2007   Past Medical History:  Diagnosis Date  . Anemia   . Arthritis    knee  . Diverticulosis   . Hx of colonic polyps 10/24/2014  . Hyperlipidemia   . Hypertension   . Osteopenia   . Overactive bladder    urge incontinence  .  PSVT (paroxysmal supraventricular tachycardia) ([place])   . Pulmonary emboli ([place])   . Pulmonary hypertension ([place])   . Right ventricular failure ([place])   . Sleep apnea    CPAP  . Spinal stenosis    Past Surgical History:  Procedure Laterality Date  . BLADDER SUSPENSION  1993  . CARDIAC CATHETERIZATION N/A 02/28/2015   Procedure: Right/Left Heart Cath and Coronary Angiography;  Surgeon: Troy Sine, MD;  Location: [facility];  Service: Cardiovascular;  Laterality: N/A;  . CATARACT EXTRACTION Bilateral 2008  . CHOLECYSTECTOMY    . COLONOSCOPY    . COLONOSCOPY N/A 10/24/2014   Procedure: COLONOSCOPY;  Surgeon: Gatha Mayer, MD;  Location: [facility] ENDOS[facility];  Service: Endoscopy;  Laterality: N/A;  . CYST REMOVAL LEG Left 02/15/2016   cyst removal from back of left knee  . ELECTROPHYSIOLOGIC STUDY N/A 03/02/2015   Procedure: SVT Ablation;  Surgeon: Evans Lance, MD;  Location: [facility];  Service: Cardiovascular;  Laterality: N/A;  . INCISIONAL HERNIA REPAIR    . RIGHT HEART CATHETERIZATION N/A 07/04/2014   Procedure: RIGHT HEART CATH;  Surgeon: Jolaine Artist, MD;  Location: [facility] CATH LAB;  Service: Cardiovascular;  Laterality: N/A;  . TONSILLECTOMY AND ADENOIDECTOMY    . TOTAL KNEE ARTHROPLASTY Left 2000  . TOTAL KNEE ARTHROPLASTY Left 10/29/2012   at [facility]  . TOTAL KNEE ARTHROPLASTY Right 03/09/2014  . TOTAL KNEE REVISION  01/12/2012   Procedure: TOTAL KNEE REVISION;  Surgeon: Kerin Salen, MD;  Location: [facility];  Service: Orthopedics;  Laterality: Left;  DEPUY/  LCS , HAND SET  . TRIGGER FINGER RELEASE  01/12/2012   Procedure: RELEASE TRIGGER FINGER/A-1 PULLEY;  Surgeon: Kerin Salen, MD;  Location: [facility];  Service: Orthopedics;  Laterality: Right;   Social History   Tobacco Use  . Smoking status: Never Smoker  . Smokeless tobacco: Never Used  Substance Use Topics  . Alcohol use: No    Alcohol/week: 0.0 standard drinks  . Drug use: No   Family History  Problem Relation Age of Onset  . Breast cancer Mother 50  . Heart disease Father   . Diabetes Father   . Hypertension Father   . Uterine cancer Sister    Allergies  Allergen Reactions  . Prednisone Other (See Comments)    hyperctivity  . Zithromax [Azithromycin] Other (See Comments)    Prolonged QT on EKG  . Penicillins Hives and Swelling    Questionable allergy (?) Currently on cefazolin, tolerating well since 4/21 Patient is tolerating dicloxacillin without issue. No skin changes or other concerns.   . Sulfonamide Derivatives Hives  . Adhesive [Tape] Other (See Comments) and Rash    redness   Current Outpatient Medications on File Prior to Visit  Medication Sig Dispense Refill  . benzonatate (TESSALON) 200 MG capsule Take 1 capsule (200 mg total) by mouth 2 (two) times daily as needed for cough. 180 capsule 3  . Calcium Carb-Cholecalciferol (CALCIUM 600/VITAMIN D3) 600-800 MG-UNIT TABS Take 1 tablet by mouth 2 (two) times daily.    . cephALEXin (KEFLEX) 500 MG capsule Take 500 mg by mouth 3 (three) times daily.     . Cholecalciferol (VITAMIN D-1000 MAX ST) 1000 UNITS tablet Take 1,000 Units by mouth daily.    . famotidine (PEPCID) 20 MG tablet Take 1 tablet (20 mg total) by mouth daily. 30 tablet 0  . FLUoxetine (PROZAC) 40 MG capsule Take 1 capsule (40 mg total) by mouth  daily. 30 capsule 11  . hydrochlorothiazide (HYDRODIURIL) 25 MG tablet TAKE 1 TABLET DAILY 90 tablet 3  . levothyroxine (SYNTHROID, LEVOTHROID) 25 MCG tablet Take 1 tablet (25 mcg total) by mouth daily before  breakfast. 30 tablet 11  . metoprolol tartrate (LOPRESSOR) 25 MG tablet Take 0.5 tablets (12.5 mg total) by mouth 2 (two) times daily. 90 tablet 3  . naproxen sodium (ANAPROX) 220 MG tablet Take 220 mg by mouth daily as needed (pain).     . OXYGEN Inhale 2 L into the lungs at bedtime.    [name] Glycol-Propyl Glycol (SYSTANE) 0.4-0.3 % SOLN Apply 1 drop to eye 2 (two) times daily.     No current facility-administered medications on file prior to visit.     Review of Systems  Constitutional: Positive for appetite change and fatigue. Negative for activity change, fever and unexpected weight change.  HENT: Negative for congestion, ear pain, rhinorrhea, sinus pressure and sore throat.   Eyes: Negative for pain, redness and visual disturbance.  Respiratory: Negative for cough, shortness of breath and wheezing.   Cardiovascular: Negative for chest pain, palpitations and leg swelling.       Chest pain is resolved  Gastrointestinal: Positive for nausea. Negative for abdominal distention, abdominal pain, anal bleeding, blood in stool, constipation, diarrhea, rectal pain and vomiting.       Epigastric pain is resolved on pepcid occ nausea- if anx or has not eaten  Endocrine: Negative for polydipsia and polyuria.  Genitourinary: Negative for dysuria, frequency, hematuria and urgency.  Musculoskeletal: Positive for arthralgias. Negative for back pain and myalgias.       Chronic infection of L knee prosthesis  Skin: Negative for pallor and rash.  Allergic/Immunologic: Negative for environmental allergies.  Neurological: Positive for numbness. Negative for dizziness, tremors, seizures, syncope, facial asymmetry, speech difficulty, weakness, light-headedness and headaches.       C/o dizziness once when anxious- better now   Hematological: Negative for adenopathy. Does not bruise/bleed easily.  Psychiatric/Behavioral: Positive for agitation, confusion, decreased concentration and dysphoric mood.  Negative for self-injury, sleep disturbance and suicidal ideas. The patient is nervous/anxious.        Objective:   Physical Exam  Constitutional: She appears well-developed and well-nourished. No distress.  Mildly anxious elderly female in no distress Frail appearing   HENT:  Head: Normocephalic and atraumatic.  Mouth/Throat: Oropharynx is clear and moist.  Eyes: Pupils are equal, round, and reactive to light. Conjunctivae and EOM are normal.  Neck: Normal range of motion. Neck supple. No JVD present. Carotid bruit is not present. No thyromegaly present.  Cardiovascular: Normal rate, regular rhythm, normal heart sounds and intact distal pulses. Exam reveals no gallop.  Toes are cool with slt purple discoloration  Pedal pulses are palpable  Pulmonary/Chest: Effort normal and breath sounds normal. No respiratory distress. She has no wheezes. She has no rales.  No crackles  Good air exch No rales or rhonchi   Abdominal: Soft. Bowel sounds are normal. She exhibits no distension, no abdominal bruit, no pulsatile midline mass and no mass. There is no tenderness. There is no rigidity, no rebound, no guarding, no CVA tenderness, no tenderness at McBurney's point and negative Murphy's sign. No hernia.  Musculoskeletal: She exhibits no edema.  Baseline swelling and dressing on L knee   Lymphadenopathy:    She has no cervical adenopathy.  Neurological: She is alert. She has normal reflexes. She displays normal reflexes. No cranial nerve deficit. She exhibits normal muscle  tone. Coordination normal.  Poor balance Could feel soft touch in feet/toes today   Skin: Skin is warm and dry. No rash noted. No pallor.  Psychiatric: Her speech is normal. Her mood appears anxious. Her affect is not blunt, not labile and not inappropriate. She is withdrawn. She is not agitated. Thought content is not paranoid. Cognition and memory are impaired. She exhibits a depressed mood. She expresses no homicidal and no  suicidal ideation. She exhibits abnormal recent memory.  Slightly timid/quiet and withdrawn today  Anxious mood when we discuss her ED visit           Assessment & Plan:   Problem List Items Addressed This Visit      Cardiovascular and Mediastinum   Essential hypertension    bp in fair control at this time  BP Readings from Last 1 Encounters:  07/06/18 128/62   No changes needed Most recent labs reviewed  Disc lifstyle change with low sodium diet and exercise          Digestive   Gastritis - Primary    Recent ED visit for chest and epigastric pain Reviewed hospital records, lab results and studies in detail  Symptoms resolved with GI cocktail and she was started on pepcid- this is helping  A little nausea this am-poss from not eating  Will watch carefully and if symptoms break through consider change to PPI        Other   Generalized anxiety disorder    Symptoms of this and dementia (sundowning) have worsened with addition of buspar Will stop this  Continue fluoxetine for this and depression  Given short px for xanax 0.5 mg to use only for severe anxiety and agitation - disc risks incl sedation/falls and habit- this is just for short term  With inc paranoia and memory issues- ref made to neuro for further eval  Suspect start of dementia         Relevant Medications   ALPRAZolam (XANAX) 0.5 MG tablet   Memory loss    Short term memory loss worsened / now sundowning with confusion at night  This is mixed with anx/depressive symptoms  Pt is aware to some extent and wants to get help  Daughter cares for her - wants to keep her as independent as possible  Will ref to neurology for further eval        Relevant Orders   Ambulatory referral to Neurology   Numbness in feet    Some decreased sensation in toes recently  Also cool and at times purple  Nl pulses on exam Suspect possible raynaud's syndrome  Disc imp of keeping toes/feet warm/socks when able if  sitting  Also add A1C to lab today to monitor for blood sugar issues/DM      Relevant Orders   Hemoglobin A1c (Completed)   RESOLVED: Other chest pain    Resolved with tx of gastritis with pepcid Reviewed hospital records, lab results and studies in detail   Cardiology was consulted  Reassuring exam today

## 2018-07-07 ENCOUNTER — Telehealth: Payer: Self-pay

## 2018-07-07 NOTE — Assessment & Plan Note (Signed)
bp in fair control at this time  BP Readings from Last 1 Encounters:  07/06/18 128/62   No changes needed Most recent labs reviewed  Disc lifstyle change with low sodium diet and exercise

## 2018-07-07 NOTE — Assessment & Plan Note (Signed)
Resolved with tx of gastritis with pepcid Reviewed hospital records, lab results and studies in detail   Cardiology was consulted  Reassuring exam today

## 2018-07-07 NOTE — Assessment & Plan Note (Signed)
Recent ED visit for chest and epigastric pain Reviewed hospital records, lab results and studies in detail  Symptoms resolved with GI cocktail and she was started on pepcid- this is helping  A little nausea this am-poss from not eating  Will watch carefully and if symptoms break through consider change to PPI

## 2018-07-07 NOTE — Telephone Encounter (Signed)
Copied from [place] [phone]. Topic: Referral - Question >> Jul 07, 2018  3:54 PM Judyann Munson wrote: Reason for CRM:  patient daughter  ( diane) is calling to advise to referral that was sent I regards to memory loss. The practice stated she could be seen till 09-01-18, she is wondering if this appt could  be sooner. She is requesting a call back please advise

## 2018-07-07 NOTE — Assessment & Plan Note (Addendum)
Symptoms of this and dementia (sundowning) have worsened with addition of buspar Will stop this  Continue fluoxetine for this and depression  Given short px for xanax 0.5 mg to use only for severe anxiety and agitation - disc risks incl sedation/falls and habit- this is just for short term  With inc paranoia and memory issues- ref made to neuro for further eval  Suspect start of dementia

## 2018-07-07 NOTE — Assessment & Plan Note (Signed)
Short term memory loss worsened / now sundowning with confusion at night  This is mixed with anx/depressive symptoms  Pt is aware to some extent and wants to get help  Daughter cares for her - wants to keep her as independent as possible  Will ref to neurology for further eval

## 2018-07-07 NOTE — Telephone Encounter (Signed)
Called [facility] to see if anyone could see her any sooner than 09/01/18, 8 weeks out. Noone had a sooner Appt. I did place her on a cancellation list and also called [name] and gave her the main phone number to call daily for cancellations with any of their providers. [name] will call daily to see if they have any cancellations.

## 2018-07-07 NOTE — Assessment & Plan Note (Signed)
Some decreased sensation in toes recently  Also cool and at times purple  Nl pulses on exam Suspect possible raynaud's syndrome  Disc imp of keeping toes/feet warm/socks when able if sitting  Also add A1C to lab today to monitor for blood sugar issues/DM

## 2018-07-07 NOTE — Telephone Encounter (Signed)
Aware. thanks

## 2018-07-09 ENCOUNTER — Telehealth: Payer: Self-pay | Admitting: Family Medicine

## 2018-07-09 ENCOUNTER — Encounter: Payer: Self-pay | Admitting: Family Medicine

## 2018-07-09 ENCOUNTER — Ambulatory Visit (INDEPENDENT_AMBULATORY_CARE_PROVIDER_SITE_OTHER): Payer: Medicare Other | Admitting: Family Medicine

## 2018-07-09 VITALS — BP 128/78 | HR 53 | Temp 98.1°F | Ht 66.0 in

## 2018-07-09 DIAGNOSIS — E538 Deficiency of other specified B group vitamins: Secondary | ICD-10-CM

## 2018-07-09 DIAGNOSIS — I10 Essential (primary) hypertension: Secondary | ICD-10-CM | POA: Diagnosis not present

## 2018-07-09 DIAGNOSIS — E785 Hyperlipidemia, unspecified: Secondary | ICD-10-CM

## 2018-07-09 DIAGNOSIS — F411 Generalized anxiety disorder: Secondary | ICD-10-CM

## 2018-07-09 DIAGNOSIS — Z0000 Encounter for general adult medical examination without abnormal findings: Secondary | ICD-10-CM | POA: Diagnosis not present

## 2018-07-09 DIAGNOSIS — I272 Pulmonary hypertension, unspecified: Secondary | ICD-10-CM

## 2018-07-09 DIAGNOSIS — R413 Other amnesia: Secondary | ICD-10-CM

## 2018-07-09 DIAGNOSIS — Z7409 Other reduced mobility: Secondary | ICD-10-CM | POA: Insufficient documentation

## 2018-07-09 DIAGNOSIS — E2839 Other primary ovarian failure: Secondary | ICD-10-CM

## 2018-07-09 DIAGNOSIS — E039 Hypothyroidism, unspecified: Secondary | ICD-10-CM | POA: Diagnosis not present

## 2018-07-09 DIAGNOSIS — T8454XS Infection and inflammatory reaction due to internal left knee prosthesis, sequela: Secondary | ICD-10-CM

## 2018-07-09 DIAGNOSIS — Z1231 Encounter for screening mammogram for malignant neoplasm of breast: Secondary | ICD-10-CM

## 2018-07-09 MED ORDER — FAMOTIDINE 20 MG PO TABS: 20.0000 mg | ORAL_TABLET | Freq: Every day | ORAL | 3 refills | Status: DC

## 2018-07-09 MED ORDER — LEVOTHYROXINE SODIUM 50 MCG PO TABS: 50.0000 ug | ORAL_TABLET | Freq: Every day | ORAL | 3 refills | Status: AC

## 2018-07-09 NOTE — Progress Notes (Signed)
Subjective:    Patient ID: [name], female    DOB: 03-09-1934, 84 y.o.   MRN: [id number]  HPI  Here for health maintenance exam and to review chronic medical problems    Wt Readings from Last 3 Encounters:  06/30/18 209 lb (94.8 kg)  06/04/18 210 lb (95.3 kg)  06/02/18 212 lb 4 oz (96.3 kg)  33.73 kg/m   Had amw on 9/10  Had her flu vaccine  Disc falls. Has had several fractures  Depression score 10 - very anxious- recently stopped buspar because it made her worse   Mammogram 9/18- is waiting  Self breast exam  Mother had early breast cancer   dexa 10/13-normal (but a hx of osteopenia before that) Falls but no fx  Ca and D  Colonoscopy 12/15 -no recall due to age   Still on cancellation list for neurology for w/u of memory problems   bp is stable today  No cp or palpitations or headaches or edema  No side effects to medicines  BP Readings from Last 3 Encounters:  07/09/18 128/78  07/06/18 128/62  07/06/18 128/62     Hx of cor pulmonale and pulmonary HTN  Uses 02 No changes   zostavax 1/09   Hypothyroidism  Pt has no clinical changes No change in energy level/ hair or skin/ edema and no tremor Lab Results  Component Value Date   TSH 4.86 (H) 07/06/2018    levothy 25 mcg    Hyperlipidemia Lab Results  Component Value Date   CHOL 135 07/06/2018   CHOL 167 06/30/2017   CHOL 162 06/20/2016   Lab Results  Component Value Date   HDL 42.90 07/06/2018   HDL 50.30 06/30/2017   HDL 52.90 06/20/2016   Lab Results  Component Value Date   LDLCALC 73 07/06/2018   LDLCALC 97 06/30/2017   LDLCALC 87 06/20/2016   Lab Results  Component Value Date   TRIG 96.0 07/06/2018   TRIG 100.0 06/30/2017   TRIG 109.0 06/20/2016   Lab Results  Component Value Date   CHOLHDL 3 07/06/2018   CHOLHDL 3 06/30/2017   CHOLHDL 3 06/20/2016   No results found for: LDLDIRECT  B12 deficiency  Lab Results  Component Value Date   VITAMINB12 894 07/06/2018  well  controlled with current supplementation   Is interested in getting a wheelchair for prn use  Can walk with a walker - can get short distance - from chair to bathroom - and for going out needs this  Needs for both weakness and balance   Lab Results  Component Value Date   CREATININE 1.13 07/06/2018   BUN 30 (H) 07/06/2018   NA 140 07/06/2018   K 4.1 07/06/2018   CL 98 07/06/2018   CO2 35 (H) 07/06/2018   Lab Results  Component Value Date   ALT 10 07/06/2018   AST 13 07/06/2018   ALKPHOS 70 07/06/2018   BILITOT 0.4 07/06/2018    Lab Results  Component Value Date   WBC 6.6 07/06/2018   HGB 14.6 07/06/2018   HCT 44.2 07/06/2018   MCV 90.0 07/06/2018   PLT 305.0 07/06/2018    Lab Results  Component Value Date   HGBA1C 5.6 07/06/2018     Review of Systems  Constitutional: Positive for fatigue. Negative for activity change, appetite change, fever and unexpected weight change.  HENT: Negative for congestion, ear pain, rhinorrhea, sinus pressure and sore throat.   Eyes: Negative for pain, redness and  visual disturbance.  Respiratory: Negative for cough, shortness of breath, wheezing and stridor.        Baseline sob   Cardiovascular: Negative for chest pain and palpitations.  Gastrointestinal: Negative for abdominal pain, blood in stool, constipation and diarrhea.  Endocrine: Negative for polydipsia and polyuria.  Genitourinary: Positive for urgency. Negative for dysuria, frequency and hematuria.  Musculoskeletal: Negative for arthralgias, back pain and myalgias.       Baseline R knee infection   Skin: Negative for pallor and rash.  Allergic/Immunologic: Negative for environmental allergies.  Neurological: Negative for dizziness, syncope, facial asymmetry and headaches.  Hematological: Negative for adenopathy. Does not bruise/bleed easily.  Psychiatric/Behavioral: Positive for agitation and confusion. Negative for decreased concentration, dysphoric mood, self-injury and  suicidal ideas. The patient is nervous/anxious.        Objective:   Physical Exam  Constitutional: She appears well-developed and well-nourished. No distress.  Well appearing elderly female in wheelchair   HENT:  Head: Normocephalic and atraumatic.  Right Ear: External ear normal.  Left Ear: External ear normal.  Mouth/Throat: Oropharynx is clear and moist.  Eyes: Pupils are equal, round, and reactive to light. Conjunctivae and EOM are normal. Right eye exhibits no discharge. Left eye exhibits no discharge. No scleral icterus.  Neck: Normal range of motion. Neck supple. No JVD present. Carotid bruit is not present. No thyromegaly present.  Cardiovascular: Normal rate, regular rhythm, normal heart sounds and intact distal pulses. Exam reveals no gallop.  Pulmonary/Chest: Effort normal and breath sounds normal. No stridor. No respiratory distress. She has no wheezes. She exhibits no tenderness. No breast tenderness, discharge or bleeding.  Diffusely distant bs   Abdominal: Soft. Bowel sounds are normal. She exhibits no distension, no abdominal bruit and no mass. There is no tenderness.  Genitourinary: No breast tenderness, discharge or bleeding.  Genitourinary Comments: Breast exam sitting Breast exam: No mass, nodules, thickening, tenderness, bulging, retraction, inflamation, nipple discharge or skin changes noted.  No axillary or clavicular LA.      Musculoskeletal: Normal range of motion. She exhibits no edema or tenderness.  L knee-baseline swelling/ dressed with gauze   Lymphadenopathy:    She has no cervical adenopathy.  Neurological: She is alert. She has normal reflexes. She displays normal reflexes. No cranial nerve deficit. She exhibits normal muscle tone. Coordination normal.  Cannot walk or stand w/o assistance Cannot rise from wheelchair w/o assistance    Skin: Skin is warm and dry. No rash noted. No erythema. No pallor.  Solar lentigines diffusely   Psychiatric: Her mood  appears anxious. Her speech is delayed. She is slowed. Thought content is not paranoid. Cognition and memory are impaired. She exhibits a depressed mood. She expresses no homicidal and no suicidal ideation. She exhibits abnormal recent memory.  Alert but very quiet today  Answers questions appropriately / with delay             Assessment & Plan:   Problem List Items Addressed This Visit      Cardiovascular and Mediastinum   Essential hypertension    bp in fair control at this time  BP Readings from Last 1 Encounters:  07/09/18 128/78   No changes needed Most recent labs reviewed  Disc lifstyle change with low sodium diet and exercise        Pulmonary hypertension ([place])    No changes clinically        Endocrine   Hypothyroid    Lab Results  Component Value Date  TSH 4.86 (H) 07/06/2018    Will inc levothyroxine to 50 mcg daily  Rev proper way to take  Re check at next f/u      Relevant Medications   levothyroxine (SYNTHROID, LEVOTHROID) 50 MCG tablet     Musculoskeletal and Integument   Infection of prosthetic left knee joint ([place])    Continues regular wound f/u / also keflex No clinical changes         Other   B12 deficiency    Lab Results  Component Value Date   VITAMINB12 894 07/06/2018  controlled with current supplementation        Dyslipidemia    Disc goals for lipids and reasons to control them Rev last labs with pt Rev low sat fat diet in detail Low LDL/good control w/o medication       Estrogen deficiency    Ref for dexa  Has had multiple fx      Relevant Orders   DG Bone Density   Generalized anxiety disorder    Has xanax for prn use  Continues fluoxetine Off buspar -no change so far  Dementia may be adding to agitation/mood changes      Memory loss    Pending neuro appt and eval      Mobility impaired    Pt cannot rise from sitting w/o assistance and can only walk less than 1 minute with walker  Family req px for  wheelchair to use when going out      Routine general medical examination at a health care facility - Primary    Reviewed health habits including diet and exercise and skin cancer prevention Reviewed appropriate screening tests for age  Also reviewed health mt list, fam hx and immunization status , as well as social and family history   See HPI Labs rev amw rev Has had flu shot  Mammogram and dexa ordered       Screening mammogram, encounter for    Scheduled annual screening mammogram Nl breast exam today  Encouraged monthly self exams        Relevant Orders   MM 3D SCREEN BREAST BILATERAL

## 2018-07-09 NOTE — Patient Instructions (Addendum)
Go up to 50 mcg daily of levothyroxine  Give it first thing in the am / 30 minutes before food or other medicines   We will refer for bone density test and mammogram at check out   No changes

## 2018-07-09 NOTE — Telephone Encounter (Signed)
Order done and in IN box  

## 2018-07-09 NOTE — Telephone Encounter (Signed)
[name] went to [facility] requesting wheelchair.  They said they need a rx written for a lightweight wheelchair and the dx code and demographics.  Fax rx and demographics to [facility] fax # [phone]

## 2018-07-10 NOTE — Assessment & Plan Note (Signed)
Disc goals for lipids and reasons to control them Rev last labs with pt Rev low sat fat diet in detail Low LDL/good control w/o medication

## 2018-07-10 NOTE — Assessment & Plan Note (Signed)
Continues regular wound f/u / also keflex No clinical changes

## 2018-07-10 NOTE — Assessment & Plan Note (Signed)
Pt cannot rise from sitting w/o assistance and can only walk less than 1 minute with walker  Family req px for wheelchair to use when going out

## 2018-07-10 NOTE — Assessment & Plan Note (Signed)
Reviewed health habits including diet and exercise and skin cancer prevention Reviewed appropriate screening tests for age  Also reviewed health mt list, fam hx and immunization status , as well as social and family history   See HPI Labs rev amw rev Has had flu shot  Mammogram and dexa ordered

## 2018-07-10 NOTE — Assessment & Plan Note (Signed)
bp in fair control at this time  BP Readings from Last 1 Encounters:  07/09/18 128/78   No changes needed Most recent labs reviewed  Disc lifstyle change with low sodium diet and exercise

## 2018-07-10 NOTE — Assessment & Plan Note (Signed)
Ref for dexa  Has had multiple fx

## 2018-07-10 NOTE — Assessment & Plan Note (Signed)
Pending neuro appt and eval

## 2018-07-10 NOTE — Assessment & Plan Note (Signed)
Lab Results  Component Value Date   TSH 4.86 (H) 07/06/2018    Will inc levothyroxine to 50 mcg daily  Rev proper way to take  Re check at next f/u

## 2018-07-10 NOTE — Assessment & Plan Note (Signed)
Lab Results  Component Value Date   [id number] 07/06/2018  controlled with current supplementation

## 2018-07-10 NOTE — Assessment & Plan Note (Signed)
No changes clinically  

## 2018-07-10 NOTE — Assessment & Plan Note (Signed)
Has xanax for prn use  Continues fluoxetine Off buspar -no change so far  Dementia may be adding to agitation/mood changes

## 2018-07-10 NOTE — Assessment & Plan Note (Signed)
Scheduled annual screening mammogram Nl breast exam today  Encouraged monthly self exams   

## 2018-07-12 NOTE — Telephone Encounter (Signed)
Order faxed with demographics, insurance card and last OV note

## 2018-07-14 ENCOUNTER — Telehealth: Payer: Self-pay | Admitting: Family Medicine

## 2018-07-14 ENCOUNTER — Encounter: Payer: Medicare Other | Attending: Internal Medicine | Admitting: Internal Medicine

## 2018-07-14 DIAGNOSIS — I499 Cardiac arrhythmia, unspecified: Secondary | ICD-10-CM | POA: Insufficient documentation

## 2018-07-14 DIAGNOSIS — I10 Essential (primary) hypertension: Secondary | ICD-10-CM | POA: Diagnosis not present

## 2018-07-14 DIAGNOSIS — M71062 Abscess of bursa, left knee: Secondary | ICD-10-CM | POA: Insufficient documentation

## 2018-07-14 DIAGNOSIS — G4730 Sleep apnea, unspecified: Secondary | ICD-10-CM | POA: Insufficient documentation

## 2018-07-14 DIAGNOSIS — Z7409 Other reduced mobility: Secondary | ICD-10-CM

## 2018-07-14 DIAGNOSIS — M7122 Synovial cyst of popliteal space [Baker], left knee: Secondary | ICD-10-CM | POA: Insufficient documentation

## 2018-07-14 DIAGNOSIS — L97929 Non-pressure chronic ulcer of unspecified part of left lower leg with unspecified severity: Secondary | ICD-10-CM | POA: Diagnosis not present

## 2018-07-14 DIAGNOSIS — Z96652 Presence of left artificial knee joint: Secondary | ICD-10-CM | POA: Insufficient documentation

## 2018-07-14 DIAGNOSIS — I272 Pulmonary hypertension, unspecified: Secondary | ICD-10-CM | POA: Diagnosis not present

## 2018-07-14 DIAGNOSIS — R5381 Other malaise: Secondary | ICD-10-CM

## 2018-07-14 DIAGNOSIS — M349 Systemic sclerosis, unspecified: Secondary | ICD-10-CM | POA: Insufficient documentation

## 2018-07-14 DIAGNOSIS — R2689 Other abnormalities of gait and mobility: Secondary | ICD-10-CM

## 2018-07-14 DIAGNOSIS — G8929 Other chronic pain: Secondary | ICD-10-CM

## 2018-07-14 DIAGNOSIS — M25562 Pain in left knee: Principal | ICD-10-CM

## 2018-07-14 DIAGNOSIS — Z8781 Personal history of (healed) traumatic fracture: Secondary | ICD-10-CM | POA: Insufficient documentation

## 2018-07-14 NOTE — Telephone Encounter (Signed)
Do they want me to order home health PT?

## 2018-07-14 NOTE — Telephone Encounter (Signed)
Ref done  Will route to PCC 

## 2018-07-14 NOTE — Telephone Encounter (Signed)
Yes daughter would like to have pt get home health PT

## 2018-07-14 NOTE — Telephone Encounter (Signed)
Copied from [place] [id number]. Topic: Quick Communication - See Telephone Encounter >> Jul 14, 2018 12:27 PM Ivar Drape wrote: CRM for notification. See Telephone encounter for: 07/14/18. Patient's daughter, [name] called to tell the provider that the physical therapy is not working for her mother because she is going through a lot of turmoil to get there.  Using her walker to the car, then from the car to inside the building is very hard on her and she cries and cries when she has to do it.  Please advise.

## 2018-07-15 NOTE — Telephone Encounter (Signed)
Sent message to [facility] to ask if they can accept the patient, waiting on response from them.

## 2018-07-17 NOTE — Progress Notes (Addendum)
ARELYS, GLASSCO ([id number]) Visit Report for 07/14/2018 HPI Details [name], [name]. Date of Service: 07/14/2018 2:00 PM Medical Record Number: [id number] Patient Account Number: [id number] Date of Birth/Sex: Mar 11, 1934 (84 y.o. F) Treating RN: Cornell Barman Primary Care Provider: Loura Pardon Other Clinician: Referring Provider: Loura Pardon Treating Provider/Extender: Tito Dine in Treatment: 18 History of Present Illness HPI Description: 10/28/16; this is an 84 year old woman who arrives with a complicated medical issue. 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 Initially in 2000 and apparently most recently in 2014. 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 Indeed she was hospitalized on 02/13/16 and discharged on 02/19/16. She was taken to the OR and it was found that the popliteal abscess was communicating with her joint space. Signed to feel fluid grew MSSA. Patient was treated with IandD of the abscess. She completed IV cefazolin on 03/28/16 and since then has been on Keflex and rifampin for chronic suppression. Her infectious disease doctor is Dr.Shrestha at [facility]. The patient was seen in our [place] sister clinic through much of October and November 2017. At that point she had a draining sinus that eventually closed over. She noted some pain behind her knee surrounding Christmas day and was seen in our emergency room on 10/23/16. During this ER visit she underwent tendon incision and drainage of a left Baker's cyst and clear colored synovial fluid was obtained. Doxycycline was added for a week to her Keflex and rifampin. A CT scan was ordered but I don't think was ever done due to artifacts from hardware. Her wound culture was negative. She has been using iodoform packing. She states her knee is painful when she stands on it for a period of time. She is not systemically unwell. She has a history of  scleroderma or without is been under control for some period of time. 11/04/16 the culture that I did of this area last week showed coag-negative staph and a few enterococcus faecium. Enterococcus is ampicillin resistant. The coag negative staph is only sensitive to vancomycin and tetracycline. Previous joint infection was methicillin sensitive staph aureus. It seems I misunderstood what she said and that her infectious disease clinic appointment is this Friday and the orthopedic appointment was last Friday. I faxed the culture results to the infectious disease clinic but I've given him a copy of it today. Per the patient and her family they orthopedic surgeon did not want to do anything further to this joint. The patient is not systemically unwell in particular no fever or chills and the pain she has is really minimal 11/18/16; this is a patient who has an infected left knee prosthetic joint. She went to see infectious disease at [facility] who apparently did did not recommend any further antibiotic therapy. She continues to feel the same episodic pain but no systemic symptoms of particular no fever. The depth of the wound in the popliteal fossa of her left knee is 3.5 versus 5 cm last week 11/25/16 4.5 cm of depth today. Draining exit site. We have treating this with iodoform packing however I really have no expectation of healing and to be truthful the amount of drainage that is coming out of this knee I don't think closure of this site is necessarily considered a positive outcome. Her left knee remains warm but not painful there is an effusion she has no systemic symptoms no fever no chills or and she feels well 12/31/16. Still is  same amount of depth today using a skinny at 4.6 cm. There is less drainage. We have been using iodoform packing and I really have no expectation of healing and to be truthful closing this sinus probably is really not the best thing to consider. The patient has an infected  artificial knee joint on the left. She is not felt to be a candidate for a fourth replacement. In the meantime she does not complain of pain except when the dressing is being changed episodically. She has not been systemically unwell no fever or chills or other issues. 01/14/17; after the patient left on 3/7 her daughter tells me the next day the wound had closed over and therefore this is not intact since. Over the last 3 days the patient has noted significant pain in the popliteal fossa of the involved left knee. 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 MSSA was the cause of the original identified joint infection treated at [facility]. 01/21/17; I saw this patient last week at which time there was intense erythema in the popliteal fossa, tenderness and a ballotable fluid collection. I did an IandD on this. Surprisingly the culture of this was negative although as a result of the IandD her tenderness and erythema in the area is resolved. We continue with the iodoform packing READMISSION 04/07/17; this is a patient I know from 2 prior admissions to this clinic starting in January of this year. Her history is essentially as noted above. 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 ([id number]) suppressive antibiotics. She has a draining sinus into the popliteal fossa of the left knee. This remains open and drains. She has been using iodoform packing to this area with gauze over the top. To be truthful I haven't really been certain whether having this area close would be helpful or harmful to this patient. Previously when it's closed she develops a lot more pain in the back of the popliteal fossa and more pain in the knee. She is here today because the packing of the area has become increasingly painful in fact the patient describes this as a "torture chamber"  experience. They are packing this wound twice a day. The patient follows with orthopedic surgery and infectious disease at [facility] although she hasn't seen them since she was last here in March. She denies any systemic symptoms including fever chills etc. She states except for the changing of the dressing her pain when she is mobilizing on the knee is 2/10. She also has a area on the left anterior patella in the incision that is excoriated open. The patient states this is itchy and she does a lot of scratching. They state this was here last time she was here but I have no recollection or documentation of this 04/21/17; patient was readmitted to clinic 2 weeks ago. She has a draining sinus in the left popliteal fossa and a chronically infected left total knee replacement. She also had an excoriation on the left anterior knee. She arrives in clinic today with all of this looking considerably better. The divot in the popliteal fossa is actually closed. READMISSION 07/15/17; the patient arrived today with a real opening of the draining sinus in her left popliteal fossa. This opened about a week ago after developing a painful "knot". Is been draining clear fluid. The patient's pain is a lot better. She is not  been systemically unwell. oShe also is been dealing with an area on the left anterior leg just below the tibial plateau. She also had this last time. Her daughter states that she scratches this area continuously. I have reviewed the last infectious disease note from August. 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 Much to my chagrin they seem reluctant to tap the joint. A culture of purulent material that came out of the popliteal fossa earlier this year cultured coag-negative staph aureus [not an uncommon cause of prosthetic joint infection]. However they really ignored this and they state that the source  was questionable. If they believe this I am wondering why they're so reluctant to tap the fluid. She has not seen orthopedics 07/22/17; patient's sinus tract in the popliteal fossa about 2.5 cm. The area on the anterior part of her knee looks a lot better. We have been using iodoform packing to the popliteal fossa, collagen to the anterior part of her knee. She is not experiencing undue pain or systemic symptoms 08/05/17; patient has a sinus tract in the popliteal fossa on the left. 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 She does not have any options for further replacements she is only been offered an above-knee amputation. 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 She has not been systemically unwell no fever. There is some pain and swelling in the knee but all in all this is not unbearable. They've been using iodoform packing 09/02/17; continued sinus tract on the popliteal fossa on the left as part of a chronic infection on a left total knee replacement. She follows with infectious disease at [facility] although the patient and her daughter are not exactly sure when her next appointment is. She has not been systemically unwell specifically denies fever chills. She has some pain in the left knee however she is reluctant to take even simple analgesics because of the risk of" addiction" 09/30/17; continued sinus tract in the popliteal fossa on the left as part of a chronic infection on the left total knee replacement. She has not been following recently with infectious disease at [facility]. She has some pain in the left knee although this is not major and not changed over the last recent months. Depth of the probing area is 1.9 cm READMISSION 03/10/18; this is a patient we have seen in this clinic several times before predominantly related to a draining sinus tract in the left popliteal fossa as part of a chronic infection in the left total knee  replacement. She sought infectious disease in February. A discontinue doxycycline she was on at the time and continued her on long-standing prophylactic cephalexin which she is still on. 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 I see she was seen by pulmonary in April with chronic respiratory failure with hypoxia and cor pulmonale and pulmonary hypertension. She comes clinic this time with the sudden onset of 2 small wounds on the right anterior patella and the right anterior lower leg just below the tibial tuberosity. It is not really clear how this happened although presumably minor trauma that the patient didn't recognize. Her daughter states that she simply noticed blood on the sheets. She may have picked at her skin as well. I believe we've had superficial wounds on the patella previously in this clinic didn't seem to heal over fairly  easily. The patient has not been systemically unwell no fever no chills she does not have a lot of pain in the knee just occasionally when she is up on it 03/17/18; this is a patient we have seen in this clinic previously. She has a chronically infected left total knee replacement that KRISTENA, WILHELMI. ([id number]) is not felt by [facility] to be a candidate for joint replacement. Previous organism has been MSSA. She has a draining sinus in the left popliteal fossa. I think the area here is been stable. She has superficial areas over the left patella and just below the tibial tuberosity. We've using silver collagen. The area over the patellas just about closed and these area under the tibial tuberosity is smaller this week. 03/31/18; patient with a chronically infected left total knee replacement that is not felt by [facility] to be candidate for joint replacement. She has a draining area in the popliteal fossa which is chronically allowed to drain. She has not tolerated when this closes oShe has a superficial area on the left patella and one  just under the left tibial tuberosity. 04/14/18; this is a patient with a chronically infected left total knee replacement that is not felt to be a candidate for further joint replacement surgery. She has a draining sinus in the popliteal fossa which drain sign no real fluid. Previously when this area has closed over she develops pain and swelling in the popliteal fossa similar to an abscess and it has to be IandD. oShe has a area over the anterior patella which is about 50% epithelialized and a small area below the left ischial tuberosity that is actually closed this week we've been using silver alginate 05/05/18; this is a patient I follow-up essentially palliative basis for a draining fistula site in the left popliteal fossa secondary to septic arthritis which is chronic and apparently not amenable to further orthopedic surgery. She also has a refractory area over the patella on the left. This at times is looked as though it's progressing towards closure although today it looks fairly stalled. We've been using silver alginate 05/19/18; palliative basis for a draining sinus in the left popliteal fossa secondary to septic arthritis which drains in this area. She has a total knee replacement and is not felt to be a candidate for a redo of that prosthesis. oShe also has a refractory area on the left patella. Actually looks fairly healthy today. Last visit changed her to [facility] with a Telfa covering 05/26/18; we follow her on a palliative basis for a draining sinus in the left popliteal fossa. oShe has also a refractory wound on the left patella. I brought her in more frequently for a surface debridement and change the dressing Hydrofera Blue last time and today this wound looks really quite good. Still debridement required however the wound is smaller with a healthy granulated bed. 07/14/18; I follow this patient for 2 wound areas. One on the left popliteal fossa which is a drainage site for a  chronically infected left total knee replacement. She also has a small open area on the left patella which has been particularly problematic to close. Her daughter relates that she constantly picks at this area scratches at it even without recognizing what she is doing. We've been using Hydrofera Blue to this area. With regards to the popliteal fossa. This needs to continue to drain. If this stops draining generally the fluid builds up and causes extreme pain. I've had to open this at least  once Electronic Signature(s) Signed: 07/21/2018 8:09:05 AM By: Gretta Cool, BSN, RN, CWS, Kim RN, BSN Signed: 07/21/2018 6:15:06 PM By: Linton Ham MD Previous Signature: 07/14/2018 4:51:54 PM Version By: Linton Ham MD Entered By: Gretta Cool, BSN, RN, CWS, Kim on 07/21/2018 08:09:04 [name] ([id number]) -------------------------------------------------------------------------------- Physical Exam Details Patient Name: TEARIA, GIBBS. Date of Service: 07/14/2018 2:00 PM Medical Record Number: [id number] Patient Account Number: [id number] Date of Birth/Sex: 08/20/1934 (84 y.o. F) Treating RN: Cornell Barman Primary Care Provider: Loura Pardon Other Clinician: Referring Provider: Loura Pardon Treating Provider/Extender: Tito Dine in Treatment: 18 Constitutional Patient is hypotensive.however she appears well. Pulse regular and within target range for patient.. Temperature is normal and within the target range for the patient.Marland Kitchen appears in no distress. Musculoskeletal the patient's enlarged left knee has an effusion. It is warm. Perhaps slightly more fluid than I'm used to feeling. However she does not look to be septic. Notes 07/14/18; I did not inspect the original draining sinus. It is still draining. My only concern would be that if this closes over. oThe area on the left patella looks to be a healthy wound is smaller. There are no excoriations and no evidence of infection Electronic  Signature(s) Signed: 07/14/2018 4:51:54 PM By: Linton Ham MD Entered By: Linton Ham on 07/14/2018 16:24:42 [name] ([id number]) -------------------------------------------------------------------------------- Physician Orders Details Patient Name: [name]. Date of Service: 07/14/2018 2:00 PM Medical Record Number: [id number] Patient Account Number: [id number] Date of Birth/Sex: [birth year] (84 y.o. F) Treating RN: Cornell Barman Primary Care Provider: Loura Pardon Other Clinician: Referring Provider: Loura Pardon Treating Provider/Extender: Tito Dine in Treatment: 77 Verbal / Phone Orders: No Diagnosis Coding Wound Cleansing Wound #6 Left Knee o Cleanse wound with mild soap and water Wound #8 Left,Posterior Knee o Cleanse wound with mild soap and water Anesthetic (add to Medication List) Wound #6 Left Knee o Topical Lidocaine 4% cream applied to wound bed prior to debridement (In Clinic Only). Wound #8 Left,Posterior Knee o Topical Lidocaine 4% cream applied to wound bed prior to debridement (In Clinic Only). Primary Wound Dressing Wound #6 Left Knee o Hydrafera Blue Ready Transfer Wound #8 Left,Posterior Knee o Other: - Drawtex Secondary Dressing Wound #6 Left Knee o [place] Wound #8 Left,Posterior Knee o Boardered Foam Dressing Dressing Change Frequency Wound #6 Left Knee o Change dressing every other day. Wound #8 Left,Posterior Knee o Change dressing every other day. Follow-up Appointments Wound #6 Left Knee o Return Appointment in 1 month Wound #8 Left,Posterior Knee o Return Appointment in 1 month Edema Control JASAMINE, POTTINGER ([id number]) Wound #6 Left Knee o Elevate legs to the level of the heart and pump ankles as often as possible Wound #8 Left,Posterior Knee o Elevate legs to the level of the heart and pump ankles as often as possible Electronic Signature(s) Signed: 07/14/2018 4:51:54 PM By:  Linton Ham MD Signed: 07/15/2018 5:07:26 PM By: Gretta Cool, BSN, RN, CWS, Kim RN, BSN Entered By: Gretta Cool, BSN, RN, CWS, Kim on 07/14/2018 14:56:36 Arana, Blima Singer ([id number]) -------------------------------------------------------------------------------- Problem List Details Patient Name: YENESIS, EVEN. Date of Service: 07/14/2018 2:00 PM Medical Record Number: [id number] Patient Account Number: [id number] Date of Birth/Sex: [birth year] (84 y.o. 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 Date of Service: 07/14/2018 2:00 PM Medical Record Number: [id number] Patient Account Number: [id number] Date of Birth/Sex: 1934-05-26 (84 y.o. F) Treating RN: Cornell Barman Primary Care Provider: Loura Pardon Other Clinician: Referring Provider: Loura Pardon Treating Provider/Extender: Tito Dine in Treatment: 18 Subjective History of Present Illness (HPI) 10/28/16; this is an 84 year old woman who arrives with a complicated medical issue. 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 Initially in 2000 and apparently most recently in 2014. 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 Indeed she was hospitalized on 02/13/16 and  discharged on 02/19/16. She was taken to the OR and it was found that the popliteal abscess was communicating with her joint space. Signed to feel fluid grew MSSA. Patient was treated with IandD of the abscess. She completed IV cefazolin on 03/28/16 and since then has been on Keflex and rifampin for chronic suppression. Her infectious disease doctor is Dr.Shrestha at [facility]. The patient was seen in our [place] sister clinic through much of October and November 2017. At that point she had a draining sinus that eventually closed over. She noted some pain behind her knee surrounding Christmas day and was seen in our emergency room on 10/23/16. During this ER visit she underwent tendon incision and drainage of a left Baker's cyst and clear colored synovial fluid was obtained. Doxycycline was added for a week to her Keflex and rifampin. A CT scan was ordered but I don't think was ever done due to artifacts from hardware. Her wound culture was negative. She has been using iodoform packing. She states her knee is painful when she stands on it for a period of time. She is not systemically unwell. She has a history of scleroderma or without is been under control for some period of time. 11/04/16 the culture that I did of this area last week showed coag-negative staph and a few enterococcus faecium. Enterococcus is ampicillin resistant. The coag negative staph is only sensitive to vancomycin and tetracycline. Previous joint infection was methicillin sensitive staph aureus. It seems I misunderstood what she said and that her infectious disease clinic appointment is this Friday and the orthopedic appointment was last Friday. I faxed the culture results to the infectious disease clinic but I've given him a copy of it today. Per the patient and her family they orthopedic surgeon did not want to do anything further to this joint. The patient is not systemically unwell in particular no fever or chills and the pain  she has is really minimal 11/18/16; this is a patient who has an infected left knee prosthetic joint. She went to see infectious disease at [facility] who apparently did did not recommend any further antibiotic therapy. She continues to feel the same episodic pain but no systemic symptoms of particular no fever. The depth of the wound in the popliteal fossa of her left knee is 3.5 versus 5 cm last week 11/25/16 4.5 cm of depth today. Draining exit site. We have treating this with iodoform packing however I really have no expectation of healing and to be truthful the amount of drainage that is coming out of this knee I don't think closure of this  site is necessarily considered a positive outcome. Her left knee remains warm but not painful there is an effusion she has no systemic symptoms no fever no chills or and she feels well 12/31/16. Still is same amount of depth today using a skinny at 4.6 cm. There is less drainage. We have been using iodoform packing and I really have no expectation of healing and to be truthful closing this sinus probably is really not the best thing to consider. 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 She is not felt to be a candidate for a fourth replacement. In the meantime she does not complain of pain except when the dressing is being changed episodically. She has not been systemically unwell no fever or chills or other issues. 01/14/17; after the patient left on 3/7 her daughter tells me the next day the wound had closed over and therefore this is not intact since. Over the last 3 days the patient has noted significant pain in the popliteal fossa of the involved left knee. 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 MSSA was the cause of the original identified joint infection treated at [facility]. 01/21/17; I saw this patient last week at which time there was intense erythema in the  popliteal fossa, tenderness and a ballotable fluid collection. I did an IandD on this. Surprisingly the culture of this was negative although as a result of the IandD her tenderness and erythema in the area is resolved. We continue with the iodoform packing READMISSION 04/07/17; this is a patient I know from 2 prior admissions to this clinic starting in January of this year. Her history is essentially as noted above. 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 She has a draining sinus into the popliteal fossa of the left knee. This remains open and drains. She has been using iodoform packing to this area with gauze over the top. To be truthful I haven't really been certain whether YALISSA, FINK. ([id number]) having this area close would be helpful or harmful to this patient. Previously when it's closed she develops a lot more pain in the back of the popliteal fossa and more pain in the knee. She is here today because the packing of the area has become increasingly painful in fact the patient describes this as a "torture chamber" experience. They are packing this wound twice a day. The patient follows with orthopedic surgery and infectious disease at [facility] although she hasn't seen them since she was last here in March. She denies any systemic symptoms including fever chills etc. She states except for the changing of the dressing her pain when she is mobilizing on the knee is 2/10. She also has a area on the left anterior patella in the incision that is excoriated open. The patient states this is itchy and she does a lot of scratching. They state this was here last time she was here but I have no recollection or documentation of this 04/21/17; patient was readmitted to clinic 2 weeks ago. She has a draining sinus in the left popliteal fossa and a chronically infected left total knee replacement. She also had an excoriation  on the left anterior knee. She arrives in clinic today with all of this looking considerably better. The divot in the popliteal fossa is actually closed. READMISSION 07/15/17; the patient arrived today with a real  opening of the draining sinus in her left popliteal fossa. This opened about a week ago after developing a painful "knot". Is been draining clear fluid. The patient's pain is a lot better. She is not been systemically unwell. She also is been dealing with an area on the left anterior leg just below the tibial plateau. She also had this last time. Her daughter states that she scratches this area continuously. I have reviewed the last infectious disease note from August. 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 Much to my chagrin they seem reluctant to tap the joint. A culture of purulent material that came out of the popliteal fossa earlier this year cultured coag-negative staph aureus [not an uncommon cause of prosthetic joint infection]. However they really ignored this and they state that the source was questionable. If they believe this I am wondering why they're so reluctant to tap the fluid. She has not seen orthopedics 07/22/17; patient's sinus tract in the popliteal fossa about 2.5 cm. The area on the anterior part of her knee looks a lot better. We have been using iodoform packing to the popliteal fossa, collagen to the anterior part of her knee. She is not experiencing undue pain or systemic symptoms 08/05/17; patient has a sinus tract in the popliteal fossa on the left. 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 She does not have any options for further replacements she is only been offered an above-knee amputation. 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 She has not been systemically unwell no fever. There is some pain and swelling in the  knee but all in all this is not unbearable. They've been using iodoform packing 09/02/17; continued sinus tract on the popliteal fossa on the left as part of a chronic infection on a left total knee replacement. She follows with infectious disease at [facility] although the patient and her daughter are not exactly sure when her next appointment is. She has not been systemically unwell specifically denies fever chills. She has some pain in the left knee however she is reluctant to take even simple analgesics because of the risk of" addiction" 09/30/17; continued sinus tract in the popliteal fossa on the left as part of a chronic infection on the left total knee replacement. She has not been following recently with infectious disease at [facility]. She has some pain in the left knee although this is not major and not changed over the last recent months. Depth of the probing area is 1.9 cm READMISSION 03/10/18; this is a patient we have seen in this clinic several times before predominantly related to a draining sinus tract in the left popliteal fossa as part of a chronic infection in the left total knee replacement. She sought infectious disease in February. A discontinue doxycycline she was on at the time and continued her on long-standing prophylactic cephalexin which she is still on. 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 I see she was seen by pulmonary in April with chronic respiratory failure with hypoxia and cor pulmonale and pulmonary hypertension. She comes clinic this time with the sudden onset of 2 small wounds on the right anterior patella and the right anterior lower leg just below the tibial tuberosity. It is not really clear how this happened although presumably minor trauma that the patient didn't recognize. Her daughter states that  she simply noticed blood on the sheets. She may have picked at her skin as well. I believe we've had superficial wounds on the  patella previously in this clinic didn't seem to heal over fairly easily. The patient has not been systemically unwell no fever no chills she does not have a lot of pain in the knee just occasionally when she is up on it 03/17/18; this is a patient we have seen in this clinic previously. She has a chronically infected left total knee replacement that is not felt by [facility] to be a candidate for joint replacement. Previous organism has been MSSA. She has a draining sinus in the left popliteal fossa. I think the area here is been stable. SALWA, BAI. ([id number]) She has superficial areas over the left patella and just below the tibial tuberosity. We've using silver collagen. The area over the patellas just about closed and these area under the tibial tuberosity is smaller this week. 03/31/18; patient with a chronically infected left total knee replacement that is not felt by [facility] to be candidate for joint replacement. She has a draining area in the popliteal fossa which is chronically allowed to drain. She has not tolerated when this closes She has a superficial area on the left patella and one just under the left tibial tuberosity. 04/14/18; this is a patient with a chronically infected left total knee replacement that is not felt to be a candidate for further joint replacement surgery. She has a draining sinus in the popliteal fossa which drain sign no real fluid. Previously when this area has closed over she develops pain and swelling in the popliteal fossa similar to an abscess and it has to be IandD. She has a area over the anterior patella which is about 50% epithelialized and a small area below the left ischial tuberosity that is actually closed this week we've been using silver alginate 05/05/18; this is a patient I follow-up essentially palliative basis for a draining fistula site in the left popliteal fossa secondary to septic arthritis which is chronic and apparently not amenable to  further orthopedic surgery. She also has a refractory area over the patella on the left. This at times is looked as though it's progressing towards closure although today it looks fairly stalled. We've been using silver alginate 05/19/18; palliative basis for a draining sinus in the left popliteal fossa secondary to septic arthritis which drains in this area. She has a total knee replacement and is not felt to be a candidate for a redo of that prosthesis. She also has a refractory area on the left patella. Actually looks fairly healthy today. Last visit changed her to [facility] with a Telfa covering 05/26/18; we follow her on a palliative basis for a draining sinus in the left popliteal fossa. She has also a refractory wound on the left patella. I brought her in more frequently for a surface debridement and change the dressing Hydrofera Blue last time and today this wound looks really quite good. Still debridement required however the wound is smaller with a healthy granulated bed. 07/14/18; I follow this patient for 2 wound areas. One on the left popliteal fossa which is a drainage site for a chronically infected left total knee replacement. She also has a small open area on the left patella which has been particularly problematic to close. Her daughter relates that she constantly picks at this area scratches at it even without recognizing what she is doing. We've been using Hydrofera Blue  to this area. With regards to the popliteal fossa. This needs to continue to drain. If this stops draining generally the fluid builds up and causes extreme pain. I've had to open this at least once Objective Constitutional Patient is hypotensive.however she appears well. Pulse regular and within target range for patient.. Temperature is normal and within the target range for the patient.Marland Kitchen appears in no distress. Vitals Time Taken: 2:02 PM, Height: 66 in, Temperature: 97.6 F, Pulse: 53 bpm, Respiratory  Rate: 16 breaths/min, Blood Pressure: 94/58 mmHg. Musculoskeletal the patient's enlarged left knee has an effusion. It is warm. Perhaps slightly more fluid than I'm used to feeling. However she does not look to be septic. General Notes: 07/14/18; I did not inspect the original draining sinus. It is still draining. My only concern would be that if this closes over. The area on the left patella looks to be a healthy wound is smaller. There are no excoriations and no evidence ANABETH, CHILCOTT. ([id number]) of infection Integumentary (Hair, Skin) Wound #6 status is Open. Original cause of wound was Gradually Appeared. The wound is located on the Left Knee. The wound measures 0.6cm length x 0.4cm width x 0.1cm depth; 0.188cm^2 area and 0.019cm^3 volume. There is Fat Layer (Subcutaneous Tissue) Exposed exposed. There is no tunneling noted. There is a medium amount of serosanguineous drainage noted. The wound margin is flat and intact. There is large (67-100%) red granulation within the wound bed. There is a small (1-33%) amount of necrotic tissue within the wound bed including Eschar. The periwound skin appearance had no abnormalities noted for color. The periwound skin appearance did not exhibit: Callus, Crepitus, Excoriation, Induration, Rash, Scarring, Dry/Scaly, Maceration. Periwound temperature was noted as No Abnormality. The periwound has tenderness on palpation. Wound #8 status is Open. Original cause of wound was Surgical Injury. The wound is located on the Left,Posterior Knee. The wound measures 0.3cm length x 0.3cm width x 0.1cm depth; 0.071cm^2 area and 0.007cm^3 volume. There is no tunneling or undermining noted. There is a large amount of serosanguineous drainage noted. The wound margin is flat and intact. There is large (67-100%) pink, hyper - granulation within the wound bed. There is no necrotic tissue within the wound bed. The periwound skin appearance had no abnormalities noted for  color. The periwound skin appearance did not exhibit: Callus, Crepitus, Excoriation, Induration, Rash, Scarring, Dry/Scaly, Maceration. Periwound temperature was noted as No Abnormality. The periwound has tenderness on palpation. 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 Wound #8 Left,Posterior Knee: Topical Lidocaine 4% cream applied to wound bed prior to debridement (In Clinic Only). Primary Wound Dressing: Wound #6 Left Knee: Hydrafera Blue Ready Transfer Wound #8 Left,Posterior Knee: Other: - Drawtex Secondary Dressing: Wound #6 Left Knee: [place] Wound #8 Left,Posterior Knee: Boardered Foam Dressing KAYONNA, LAWNICZAK ([id number]) Dressing Change Frequency: Wound #6 Left Knee: Change dressing every other day. Wound #8 Left,Posterior Knee: Change dressing every other day. Follow-up Appointments: Wound #6 Left Knee: Return Appointment in 1 month Wound #8 Left,Posterior Knee: Return Appointment in 1 month Edema Control: Wound #6 Left Knee: Elevate legs to the level of  the heart and pump ankles as often as possible Wound #8 Left,Posterior Knee: Elevate legs to the level of the heart and pump ankles as often as possible #1Hydrofera Blue to the left anterior knee #2 Drawtex to the left posterior knee Electronic Signature(s) Signed: 07/28/2018 8:03:30 AM By: Gretta Cool, BSN, RN, CWS, Kim RN, BSN Signed: 07/28/2018 8:31:10 AM By: Linton Ham MD Previous Signature: 07/14/2018 4:51:54 PM Version By: Linton Ham MD Entered By: Gretta Cool, BSN, RN, CWS, Kim on 07/28/2018  08:03:30 Eilee, Schader Blima Singer ([id number]) -------------------------------------------------------------------------------- [place] Details Patient Name: TAKERIA, MARQUINA. Date of Service: 07/14/2018 Medical Record Number: [id number] Patient Account Number: [id number] Date of Birth/Sex: 09/06/1934 (84 y.o. 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

## 2018-07-17 NOTE — Progress Notes (Addendum)
[name], [name] ([id number]) Visit Report for 07/14/2018 Arrival Information Details [name], [name]. Date of Service: 07/14/2018 2:00 PM Medical Record Number: [id number] Patient Account Number: [id number] Date of Birth/Sex: 1934/03/30 (84 y.o. F) Treating RN: Secundino Ginger Primary Care Vonnetta Akey: Loura Pardon Other Clinician: Referring Jeromiah Ohalloran: Loura Pardon Treating Sanaa Zilberman/Extender: Tito Dine in Treatment: 18 Visit Information History Since Last Visit Added or deleted any medications: No Patient Arrived: Wheel Chair Any new allergies or adverse reactions: No Arrival Time: 14:02 Had a fall or experienced change in No activities of daily living that may affect Accompanied By: daughter risk of falls: Transfer Assistance: Other Signs or symptoms of abuse/neglect since last visito No Patient Identification Verified: Yes Hospitalized since last visit: No Secondary Verification Process Completed: Yes Implantable device outside of the clinic excluding No Patient Requires Transmission-Based No cellular tissue based products placed in the center Precautions: since last visit: Patient Has Alerts: No Has Dressing in Place as Prescribed: Yes Pain Present Now: No Notes pt requested to stay in [facility]. Electronic Signature(s) Signed: 07/14/2018 3:42:50 PM By: Secundino Ginger Entered By: Secundino Ginger on 07/14/2018 14:03:41 [place], [name] ([id number]) -------------------------------------------------------------------------------- Clinic Level of Care Assessment Details [name]. Date of Service: 07/14/2018 2:00 PM Medical Record Number: [id number] Patient Account Number: [id number] Date of Birth/Sex: 27-Sep-1934 (84 y.o. F) Treating RN: Cornell Barman Primary Care Shemiah Rosch: Loura Pardon Other Clinician: Referring Bari Handshoe: Loura Pardon Treating Casanova Schurman/Extender: Tito Dine in Treatment: 18 Clinic Level of Care Assessment Items TOOL 4 Quantity  Score []  - Use when only an EandM is performed on FOLLOW-UP visit 0 ASSESSMENTS - Nursing Assessment / Reassessment X - Reassessment of Co-morbidities (includes updates in patient status) 1 10 X- 1 5 Reassessment of Adherence to Treatment Plan ASSESSMENTS - Wound and Skin Assessment / Reassessment X - Simple Wound Assessment / Reassessment - one wound 1 5 []  - 0 Complex Wound Assessment / Reassessment - multiple wounds []  - 0 Dermatologic / Skin Assessment (not related to wound area) ASSESSMENTS - Focused Assessment []  - Circumferential Edema Measurements - multi extremities 0 []  - 0 Nutritional Assessment / Counseling / Intervention []  - 0 Lower Extremity Assessment (monofilament, tuning fork, pulses) []  - 0 Peripheral Arterial Disease Assessment (using hand held doppler) ASSESSMENTS - Ostomy and/or Continence Assessment and Care []  - Incontinence Assessment and Management 0 []  - 0 Ostomy Care Assessment and Management (repouching, etc.) PROCESS - Coordination of Care X - Simple Patient / Family Education for ongoing care 1 15 []  - 0 Complex (extensive) Patient / Family Education for ongoing care X- 1 10 Staff obtains Programmer, systems, Records, Test Results / Process Orders []  - 0 Staff telephones HHA, Nursing Homes / Clarify orders / etc []  - 0 Routine Transfer to another Facility (non-emergent condition) []  - 0 Routine Hospital Admission (non-emergent condition) []  - 0 New Admissions / Biomedical engineer / Ordering NPWT, Apligraf, etc. []  - 0 Emergency Hospital Admission (emergent condition) X- 1 10 Simple Discharge Coordination LERIN, JECH ([id number]) []  - 0 Complex (extensive) Discharge Coordination PROCESS - Special Needs []  - Pediatric / Minor Patient Management 0 []  - 0 Isolation Patient Management []  - 0 Hearing / Language / Visual special needs []  - 0 Assessment of Community assistance (transportation, D/C planning, etc.) []  - 0 Additional assistance  / Altered mentation []  - 0 Support Surface(s) Assessment (bed, cushion, seat, etc.) INTERVENTIONS - Wound Cleansing / Measurement X - Simple Wound Cleansing - one  wound 1 5 []  - 0 Complex Wound Cleansing - multiple wounds X- 1 5 Wound Imaging (photographs - any number of wounds) []  - 0 Wound Tracing (instead of photographs) X- 1 5 Simple Wound Measurement - one wound []  - 0 Complex Wound Measurement - multiple wounds INTERVENTIONS - Wound Dressings []  - Small Wound Dressing one or multiple wounds 0 []  - 0 Medium Wound Dressing one or multiple wounds X- 1 20 Large Wound Dressing one or multiple wounds []  - 0 Application of Medications - topical []  - 0 Application of Medications - injection INTERVENTIONS - Miscellaneous []  - External ear exam 0 []  - 0 Specimen Collection (cultures, biopsies, blood, body fluids, etc.) []  - 0 Specimen(s) / Culture(s) sent or taken to Lab for analysis []  - 0 Patient Transfer (multiple staff / Civil Service fast streamer / Similar devices) []  - 0 Simple Staple / Suture removal (25 or less) []  - 0 Complex Staple / Suture removal (26 or more) []  - 0 Hypo / Hyperglycemic Management (close monitor of Blood Glucose) []  - 0 Ankle / Brachial Index (ABI) - do not check if billed separately X- 1 5 Vital Signs [name], [name] ([id number]) Has the patient been seen at the hospital within the last three years: Yes Total Score: 95 Level Of Care: New/Established - Level 3 Electronic Signature(s) Signed: 07/15/2018 5:07:26 PM By: Gretta Cool, BSN, RN, CWS, Kim RN, BSN Entered By: Gretta Cool, BSN, RN, CWS, Kim on 07/14/2018 14:29:28 [name] ([id number]) -------------------------------------------------------------------------------- Complex / Palliative Patient Assessment Details [name]. Date of Service: 07/14/2018 2:00 PM Medical Record Number: [id number] Patient Account Number: [id number] Date of Birth/Sex: 09/07/1934 (84 y.o. F) Treating RN: Cornell Barman Primary Care Cade Dashner: Loura Pardon Other Clinician: Referring Takyra Cantrall: Loura Pardon Treating Philana Younis/Extender: Tito Dine in Treatment: 18 Palliative Management Criteria Complex Wound Management Criteria Patient requires a surgical procedure in order to achieve wound healing: However, the surgeon has determined that the patient is not a surgical candidate due to medical status. However, the patient does not wish to undergo the recommended surgical procedure. Care Approach Wound Care Plan: Complex Wound Management Electronic Signature(s) Signed: 07/14/2018 8:37:59 AM By: Gretta Cool, BSN, RN, CWS, Kim RN, BSN Signed: 07/14/2018 4:51:54 PM By: Linton Ham MD Entered By: Gretta Cool, BSN, RN, CWS, Kim on 07/14/2018 08:37:58 [name] ([id number]) -------------------------------------------------------------------------------- Encounter Discharge Information Details [name], [name]. Date of Service: 07/14/2018 2:00 PM Medical Record Number: [id number] Patient Account Number: [id number] Date of Birth/Sex: 07-25-1934 (84 y.o. F) Treating RN: Montey Hora Primary Care Nathen Balaban: Loura Pardon Other Clinician: Referring Mark Hassey: Loura Pardon Treating Zebedee Segundo/Extender: Tito Dine in Treatment: 18 Encounter Discharge Information Items Discharge Condition: Stable Ambulatory Status: Wheelchair Discharge Destination: Home Transportation: Private Auto Accompanied By: dtr Schedule Follow-up Appointment: Yes Clinical Summary of Care: Electronic Signature(s) Signed: 07/14/2018 3:12:33 PM By: Montey Hora Entered By: Montey Hora on 07/14/2018 15:12:33 [name] ([id number]) -------------------------------------------------------------------------------- Lower Extremity Assessment Details [name]. Date of Service: 07/14/2018 2:00 PM Medical Record Number: [id number] Patient Account Number: [id number] Date of Birth/Sex:  06-01-1934 (84 y.o. F) Treating RN: Secundino Ginger Primary Care Mariyanna Mucha: Loura Pardon Other Clinician: Referring Mikeyla Music: Loura Pardon Treating Kyrin Gratz/Extender: Ricard Dillon Weeks in Treatment: 18 Electronic Signature(s) Signed: 07/14/2018 3:42:50 PM By: Secundino Ginger Entered By: Secundino Ginger on 07/14/2018 14:05:37 [name], [name] ([id number]) -------------------------------------------------------------------------------- Multi Wound Chart Details [name]. Date of Service: 07/14/2018 2:00 PM Medical Record  Number: [id number] Patient Account Number: [id number] Date of Birth/Sex: [birth year] (84 y.o. F) Treating RN: Cornell Barman Primary Care Tyneka Scafidi: Loura Pardon Other Clinician: Referring Areebah Meinders: Loura Pardon Treating Hildy Nicholl/Extender: Tito Dine in Treatment: 18 Vital Signs Height(in): 65 Pulse(bpm): 29 Weight(lbs): Blood Pressure(mmHg): 38/58 Body Mass Index(BMI): Temperature(F): 97.6 Respiratory Rate 16 (breaths/min): Photos: [N/A:N/A] Wound Location: Left Knee Left Knee - Posterior N/A Wounding Event: Gradually Appeared Surgical Injury N/A Primary Etiology: Lesion Infection - not elsewhere N/A classified Comorbid History: Cataracts, Sleep Apnea, Cataracts, Sleep Apnea, N/A Arrhythmia, Hypertension, Arrhythmia, Hypertension, Scleroderma Scleroderma Date Acquired: 02/24/2018 02/24/2018 N/A Weeks of Treatment: 18 18 N/A Wound Status: Open Open N/A Measurements L x W x D 0.6x0.4x0.1 0.3x0.3x0.1 N/A (cm) Area (cm) : 0.188 0.071 N/A Volume (cm) : 0.019 0.007 N/A % Reduction in Area: 75.10% 0.00% N/A % Reduction in Volume: 74.70% 50.00% N/A Classification: Partial Thickness Partial Thickness N/A Exudate Amount: Medium Large N/A Exudate Type: Serosanguineous Serosanguineous N/A Exudate Color: red, brown red, brown N/A Wound Margin: Flat and Intact Flat and Intact N/A Granulation Amount: Large (67-100%) Large (67-100%) N/A Granulation  Quality: Red Pink, Hyper-granulation N/A Necrotic Amount: Small (1-33%) None Present (0%) N/A Necrotic Tissue: Eschar N/A N/A Exposed Structures: Fat Layer (Subcutaneous Fascia: No N/A Tissue) Exposed: Yes Fat Layer (Subcutaneous Fascia: No Tissue) Exposed: No Tendon: No Tendon: No [name], [name] ([id number]) Muscle: No Muscle: No Joint: No Joint: No Bone: No Bone: No Epithelialization: None None N/A Periwound Skin Texture: Excoriation: No Excoriation: No N/A Induration: No Induration: No Callus: No Callus: No Crepitus: No Crepitus: No Rash: No Rash: No Scarring: No Scarring: No Periwound Skin Moisture: Maceration: No Maceration: No N/A Dry/Scaly: No Dry/Scaly: No Periwound Skin Color: No Abnormalities Noted Atrophie Blanche: No N/A Cyanosis: No Ecchymosis: No Erythema: No Hemosiderin Staining: No Mottled: No Pallor: No Rubor: No Temperature: No Abnormality No Abnormality N/A Tenderness on Palpation: Yes Yes N/A Wound Preparation: Ulcer Cleansing: Ulcer Cleansing: N/A Rinsed/Irrigated with Saline Rinsed/Irrigated with Saline Topical Anesthetic Applied: Topical Anesthetic Applied: Other: lidocaine 4% Other: lidocaine 4% Treatment Notes Wound #6 (Left Knee) 1. Cleansed with: Clean wound with Normal Saline 4. Dressing Applied: Hydrafera Blue 5. Secondary [place] Wound #8 (Left, Posterior Knee) 1. Cleansed with: Clean wound with Normal Saline 4. Dressing Applied: Other dressing (specify in notes) 5. Secondary Dressing Applied Dry Gauze 7. Secured with Tape Notes drawtex Electronic Signature(s) Signed: 07/14/2018 4:51:54 PM By: Linton Ham MD Entered By: Linton Ham on 07/14/2018 16:19:46 [name], [name] ([id number]) [name], [name] ([id number]) -------------------------------------------------------------------------------- [place] Details [name], [name]. Date of Service:  07/14/2018 2:00 PM Medical Record Number: [id number] Patient Account Number: [id number] Date of Birth/Sex: 1934-03-29 (84 y.o. F) Treating RN: Cornell Barman Primary Care Krystyl Cannell: Loura Pardon Other Clinician: Referring Marty Sadlowski: Loura Pardon Treating Onetha Gaffey/Extender: Tito Dine in Treatment: 18 Active Inactive Electronic Signature(s) Signed: 09/03/2018 7:44:20 AM By: Gretta Cool, BSN, RN, CWS, Kim RN, BSN Previous Signature: 07/15/2018 5:07:26 PM Version By: Gretta Cool, BSN, RN, CWS, Kim RN, BSN Entered By: Gretta Cool, BSN, RN, CWS, Kim on 09/03/2018 07:44:20 [name] ([id number]) -------------------------------------------------------------------------------- Pain Assessment Details [name], [name]. Date of Service: 07/14/2018 2:00 PM Medical Record Number: [id number] Patient Account Number: [id number] Date of Birth/Sex: 10-22-1934 (84 y.o. F) Treating RN: Secundino Ginger Primary Care Davius Goudeau: Loura Pardon Other Clinician: Referring Lorrene Graef: Loura Pardon Treating Meah Jiron/Extender: Tito Dine in Treatment: 18 Active Problems Location of Pain Severity and Description of  Pain Patient Has Paino No Site Locations Pain Management and Medication Current Pain Management: Goals for Pain Management pt denies any pain at this time. Electronic Signature(s) Signed: 07/14/2018 3:42:50 PM By: Secundino Ginger Entered By: Secundino Ginger on 07/14/2018 14:03:59 [name] ([id number]) -------------------------------------------------------------------------------- Patient/Caregiver Education Details [name]. Date of Service: 07/14/2018 2:00 PM Medical Record Number: [id number] Patient Account Number: [id number] Date of Birth/Gender: September 20, 1934 (84 y.o. F) Treating RN: Montey Hora Primary Care Physician: Loura Pardon Other Clinician: Referring Physician: Loura Pardon Treating Physician/Extender: Tito Dine in Treatment: 49 Education  Assessment Education Provided To: Patient and Caregiver Education Topics Provided Wound/Skin Impairment: Handouts: Other: wound care as ordered Methods: Demonstration, Explain/Verbal Responses: State content correctly Electronic Signature(s) Signed: 07/14/2018 4:04:54 PM By: Montey Hora Entered By: Montey Hora on 07/14/2018 15:12:51 [name] ([id number]) -------------------------------------------------------------------------------- Wound Assessment Details [name]. Date of Service: 07/14/2018 2:00 PM Medical Record Number: [id number] Patient Account Number: [id number] Date of Birth/Sex: 07/13/1934 (84 y.o. F) Treating RN: Secundino Ginger Primary Care Zeriah Baysinger: Loura Pardon Other Clinician: Referring Ethyle Tiedt: Loura Pardon Treating Cohen Doleman/Extender: Tito Dine in Treatment: 18 Wound Status Wound Number: 6 Primary Lesion Etiology: Wound Location: Left Knee Wound Status: Open Wounding Event: Gradually Appeared Comorbid Cataracts, Sleep Apnea, Arrhythmia, Date Acquired: 02/24/2018 History: Hypertension, Scleroderma Weeks Of Treatment: 18 Clustered Wound: No Photos Photo Uploaded By: Secundino Ginger on 07/14/2018 14:16:30 Wound Measurements Length: (cm) 0.6 Width: (cm) 0.4 Depth: (cm) 0.1 Area: (cm) 0.188 Volume: (cm) 0.019 % Reduction in Area: 75.1% % Reduction in Volume: 74.7% Epithelialization: None Tunneling: No Wound Description Classification: Partial Thickness Foul Odo Wound Margin: Flat and Intact Slough/F Exudate Amount: Medium Exudate Type: Serosanguineous Exudate Color: red, brown r After Cleansing: No ibrino No Wound Bed Granulation Amount: Large (67-100%) Exposed Structure Granulation Quality: Red Fascia Exposed: No Necrotic Amount: Small (1-33%) Fat Layer (Subcutaneous Tissue) Exposed: Yes Necrotic Quality: Eschar Tendon Exposed: No Muscle Exposed: No Joint Exposed: No Bone Exposed: No Periwound Skin  Texture [name], [name] ([id number]) Texture Color No Abnormalities Noted: No No Abnormalities Noted: Yes Callus: No Temperature / Pain Crepitus: No Temperature: No Abnormality Excoriation: No Tenderness on Palpation: Yes Induration: No Rash: No Scarring: No Moisture No Abnormalities Noted: No Dry / Scaly: No Maceration: No Wound Preparation Ulcer Cleansing: Rinsed/Irrigated with Saline Topical Anesthetic Applied: Other: lidocaine 4%, Electronic Signature(s) Signed: 07/14/2018 3:42:50 PM By: Secundino Ginger Entered By: Secundino Ginger on 07/14/2018 14:08:58 [name] ([id number]) -------------------------------------------------------------------------------- Wound Assessment Details [name]. Date of Service: 07/14/2018 2:00 PM Medical Record Number: [id number] Patient Account Number: [id number] Date of Birth/Sex: May 05, 1934 (84 y.o. F) Treating RN: Secundino Ginger Primary Care Harbour Nordmeyer: Loura Pardon Other Clinician: Referring Jeromiah Ohalloran: Loura Pardon Treating Brydan Downard/Extender: Tito Dine in Treatment: 18 Wound Status Wound Number: 8 Primary Infection - not elsewhere classified Etiology: Wound Location: Left Knee - Posterior Wound Status: Open Wounding Event: Surgical Injury Comorbid Cataracts, Sleep Apnea, Arrhythmia, Date Acquired: 02/24/2018 History: Hypertension, Scleroderma Weeks Of Treatment: 18 Clustered Wound: No Wound under treatment by Jacqueleen Pulver outside of [place] Uploaded By: Secundino Ginger on 07/14/2018 14:16:31 Wound Measurements Length: (cm) 0.3 Width: (cm) 0.3 Depth: (cm) 0.1 Area: (cm) 0.071 Volume: (cm) 0.007 % Reduction in Area: 0% % Reduction in Volume: 50% Epithelialization: None Tunneling: No Undermining: No Wound Description Classification: Partial Thickness Wound Margin: Flat and Intact Exudate Amount: Large Exudate Type: Serosanguineous Exudate Color: red, brown Foul Odor After  Cleansing:  No Slough/Fibrino No Wound Bed Granulation Amount: Large (67-100%) Exposed Structure Granulation Quality: Pink, Hyper-granulation Fascia Exposed: No Necrotic Amount: None Present (0%) Fat Layer (Subcutaneous Tissue) Exposed: No Tendon Exposed: No Muscle Exposed: No Joint Exposed: No Bone Exposed: No Periwound Skin Texture [name], [name] ([id number]) Texture Color No Abnormalities Noted: No No Abnormalities Noted: Yes Callus: No Temperature / Pain Crepitus: No Temperature: No Abnormality Excoriation: No Tenderness on Palpation: Yes Induration: No Rash: No Scarring: No Moisture No Abnormalities Noted: No Dry / Scaly: No Maceration: No Wound Preparation Ulcer Cleansing: Rinsed/Irrigated with Saline Topical Anesthetic Applied: Other: lidocaine 4%, Electronic Signature(s) Signed: 07/14/2018 3:42:50 PM By: Secundino Ginger Entered By: Secundino Ginger on 07/14/2018 14:12:28 [name] ([id number]) -------------------------------------------------------------------------------- [place] Details [name]. Date of Service: 07/14/2018 2:00 PM Medical Record Number: [id number] Patient Account Number: [id number] Date of Birth/Sex: Sep 28, 1934 (84 y.o. F) Treating RN: Secundino Ginger Primary Care Raniah Karan: Loura Pardon Other Clinician: Referring Chrisha Vogel: Loura Pardon Treating Daria Mcmeekin/Extender: Tito Dine in Treatment: 18 Vital Signs Time Taken: 14:02 Temperature (F): 97.6 Height (in): 66 Pulse (bpm): 53 Respiratory Rate (breaths/min): 16 Blood Pressure (mmHg): 94/58 Reference Range: 80 - 120 mg / dl Electronic Signature(s) Signed: 07/14/2018 3:42:50 PM By: Secundino Ginger Entered By: Secundino Ginger on 07/14/2018 14:04:34

## 2018-07-19 ENCOUNTER — Telehealth: Payer: Self-pay | Admitting: Family Medicine

## 2018-07-19 NOTE — Telephone Encounter (Signed)
Left VM giving Gerald Stabs the verbal order

## 2018-07-19 NOTE — Telephone Encounter (Signed)
Please ok those verbal orders  

## 2018-07-19 NOTE — Telephone Encounter (Signed)
Copied from [place] [phone]. Topic: Quick Communication - See Telephone Encounter >> Jul 19, 2018 11:22 AM Conception Chancy, NT wrote: CRM for notification. See Telephone encounter for: 07/19/18.  Gerald Stabs is a physical therapist with [facility] and is requesting verbal orders to continue Pt. 2x a week for 3 weeks.  Gerald Stabs # [phone]

## 2018-07-21 ENCOUNTER — Telehealth: Payer: Self-pay | Admitting: Family Medicine

## 2018-07-21 NOTE — Telephone Encounter (Signed)
Copied from [place] [id number]. Topic: Quick Communication - Rx Refill/Question >> Jul 21, 2018  3:57 PM Margot Ables wrote: Medication: famotidine (PEPCID) 20 MG tablet - medication is not covered by ins and is $40 per month - please advise of an alternative.  Preferred Pharmacy (with phone number or street name): [id number] - [place], [place] [phone] (Phone) [phone] (Fax)

## 2018-07-21 NOTE — Telephone Encounter (Signed)
Please send in ranitidine 150 mg 1 po bid #[phone] ref  If not cheaper please let me know  Both of these meds are also otc and may be cheaper that way

## 2018-07-22 MED ORDER — RANITIDINE HCL 150 MG PO TABS: 150.0000 mg | ORAL_TABLET | Freq: Two times a day (BID) | ORAL | 11 refills | Status: DC

## 2018-07-22 NOTE — Telephone Encounter (Signed)
Rx sent and daughter notified and advise of Dr. Marliss Coots comments

## 2018-07-26 ENCOUNTER — Telehealth: Payer: Self-pay | Admitting: Pulmonary Disease

## 2018-07-26 NOTE — Telephone Encounter (Signed)
Called [facility], unable to reach left message to give [place] a call back.

## 2018-07-26 NOTE — Telephone Encounter (Signed)
Called and [facility]. I was left on hold for 10 minutes. Will attempt to call back later.

## 2018-07-27 ENCOUNTER — Telehealth: Payer: Self-pay

## 2018-07-27 DIAGNOSIS — Z7409 Other reduced mobility: Secondary | ICD-10-CM

## 2018-07-27 DIAGNOSIS — G301 Alzheimer's disease with late onset: Principal | ICD-10-CM

## 2018-07-27 DIAGNOSIS — F0280 Dementia in other diseases classified elsewhere without behavioral disturbance: Secondary | ICD-10-CM

## 2018-07-27 DIAGNOSIS — T8454XS Infection and inflammatory reaction due to internal left knee prosthesis, sequela: Secondary | ICD-10-CM

## 2018-07-27 NOTE — Telephone Encounter (Signed)
Last annual exam 07/09/18; I spoke with Levander Campion pt is more anxious,agitated and confused. Levander Campion is staying with pt at pts home and Levander Campion is exhausted. Dianne request cb after Dr Marliss Coots review.

## 2018-07-27 NOTE — Telephone Encounter (Signed)
Copied from [place] [phone]. Topic: Referral - Request >> Jul 27, 2018  4:20 PM Sheran Luz wrote: Reason for CRM: Pts daughter Levander Campion calling to request a referral to [facility] in [place] for palliative care. Levander Campion can be reached at [phone].  [place] #  [phone]

## 2018-07-27 NOTE — Telephone Encounter (Signed)
Spoke with [name] and advised her I only have notes from 4/19. She was ok with those and wanted me to fax them to [phone]. Nothing further is needed at this time.

## 2018-07-28 NOTE — Telephone Encounter (Signed)
Ref done Will send to [facility] 

## 2018-07-28 NOTE — Telephone Encounter (Signed)
Called daughter Levander Campion, she is okay using [facility]. Palliative Care form placed on Dr Alba Cory desk for completion and will fax when signed.

## 2018-08-02 ENCOUNTER — Ambulatory Visit
Admission: RE | Admit: 2018-08-02 | Discharge: 2018-08-02 | Disposition: A | Discharge status: Home / Self Care | Payer: Medicare Other | Source: Ambulatory Visit | Attending: Family Medicine | Admitting: Family Medicine

## 2018-08-02 ENCOUNTER — Telehealth: Payer: Self-pay

## 2018-08-02 DIAGNOSIS — R350 Frequency of micturition: Secondary | ICD-10-CM

## 2018-08-02 DIAGNOSIS — S72114D Nondisplaced fracture of greater trochanter of right femur, subsequent encounter for closed fracture with routine healing: Secondary | ICD-10-CM

## 2018-08-02 DIAGNOSIS — R413 Other amnesia: Secondary | ICD-10-CM

## 2018-08-02 DIAGNOSIS — Z9181 History of falling: Secondary | ICD-10-CM

## 2018-08-02 DIAGNOSIS — I2781 Cor pulmonale (chronic): Secondary | ICD-10-CM

## 2018-08-02 DIAGNOSIS — Z9981 Dependence on supplemental oxygen: Secondary | ICD-10-CM

## 2018-08-02 DIAGNOSIS — T8484XD Pain due to internal orthopedic prosthetic devices, implants and grafts, subsequent encounter: Secondary | ICD-10-CM

## 2018-08-02 DIAGNOSIS — Z1231 Encounter for screening mammogram for malignant neoplasm of breast: Secondary | ICD-10-CM | POA: Insufficient documentation

## 2018-08-02 DIAGNOSIS — E2839 Other primary ovarian failure: Secondary | ICD-10-CM | POA: Diagnosis present

## 2018-08-02 DIAGNOSIS — G4733 Obstructive sleep apnea (adult) (pediatric): Secondary | ICD-10-CM

## 2018-08-02 DIAGNOSIS — T8454XD Infection and inflammatory reaction due to internal left knee prosthesis, subsequent encounter: Secondary | ICD-10-CM

## 2018-08-02 DIAGNOSIS — F411 Generalized anxiety disorder: Secondary | ICD-10-CM

## 2018-08-02 DIAGNOSIS — Z96651 Presence of right artificial knee joint: Secondary | ICD-10-CM

## 2018-08-02 DIAGNOSIS — E039 Hypothyroidism, unspecified: Secondary | ICD-10-CM

## 2018-08-02 DIAGNOSIS — G8929 Other chronic pain: Secondary | ICD-10-CM

## 2018-08-02 DIAGNOSIS — I272 Pulmonary hypertension, unspecified: Secondary | ICD-10-CM

## 2018-08-02 DIAGNOSIS — R296 Repeated falls: Secondary | ICD-10-CM

## 2018-08-02 DIAGNOSIS — I10 Essential (primary) hypertension: Secondary | ICD-10-CM

## 2018-08-02 DIAGNOSIS — Z86711 Personal history of pulmonary embolism: Secondary | ICD-10-CM

## 2018-08-02 DIAGNOSIS — Z792 Long term (current) use of antibiotics: Secondary | ICD-10-CM

## 2018-08-02 DIAGNOSIS — S2241XD Multiple fractures of ribs, right side, subsequent encounter for fracture with routine healing: Secondary | ICD-10-CM

## 2018-08-02 DIAGNOSIS — M48061 Spinal stenosis, lumbar region without neurogenic claudication: Secondary | ICD-10-CM

## 2018-08-02 DIAGNOSIS — J9610 Chronic respiratory failure, unspecified whether with hypoxia or hypercapnia: Secondary | ICD-10-CM

## 2018-08-02 DIAGNOSIS — Z86718 Personal history of other venous thrombosis and embolism: Secondary | ICD-10-CM

## 2018-08-02 DIAGNOSIS — M4310 Spondylolisthesis, site unspecified: Secondary | ICD-10-CM

## 2018-08-02 DIAGNOSIS — E785 Hyperlipidemia, unspecified: Secondary | ICD-10-CM

## 2018-08-02 DIAGNOSIS — E538 Deficiency of other specified B group vitamins: Secondary | ICD-10-CM

## 2018-08-02 NOTE — Telephone Encounter (Signed)
Phone call placed to patient's daughter, Levander Campion, to introduce Palliative care and to schedule visit with NP. Visit scheduled for Wednesday 08/04/18

## 2018-08-03 ENCOUNTER — Encounter: Payer: Self-pay | Admitting: *Deleted

## 2018-08-04 ENCOUNTER — Other Ambulatory Visit: Payer: Medicare Other | Admitting: Primary Care

## 2018-08-04 DIAGNOSIS — Z515 Encounter for palliative care: Secondary | ICD-10-CM

## 2018-08-04 DIAGNOSIS — M170 Bilateral primary osteoarthritis of knee: Secondary | ICD-10-CM

## 2018-08-04 DIAGNOSIS — F0391 Unspecified dementia with behavioral disturbance: Secondary | ICD-10-CM

## 2018-08-04 NOTE — Progress Notes (Signed)
PALLIATIVE CARE CONSULT VISIT   [name] DOB: 02/23/1934 MRN: [id number]  PRIMARY CARE PROVIDER:   Abner Greenspan, MD  REFERRING PROVIDER:  Abner Greenspan, MD [place], [place] [address]  RESPONSIBLE PARTY:   Daughter Elberta Fortis is POA ASSESSMENT:     Goals of Care: Patient has advance directives, which family produced and were discussed at visit. POA stated they needed to discuss further what their current desires are because it was made 5 years ago. Currently desire  a full code, full interventions.  Memory changes/behavior: Patient exhibits signs of dementia, rated as FAST 6C. She can converse socially but does not know the date. Family state she will forget to call for help get up and go to bathroom if not monitored, and she is very unstable with ambulation. she needs heavy assistance with ADLs and all IADLS. She is also exhibiting some signs of afternoon/evening panic attacks and behavior disturbances by family report.   Caregiver burden: Patient is cared for by her daughter who lives in. Other family assists as they can. Caregiver voiced need for additional help and respite.  Looked at adult day care in the recent past which would have been free of charge, but patient didn't think she'd like to go.   Mobility: Currently has home health [facility]. Family states she did have PT in the past without much function recovery. Patient is a heavy 1 or 2 person assist to rise, and uses a walker to ambulate. Able to do some adls such as feeding herself, brushing her teeth and combing he hair, can assist in dressing but still needs much assistance with bathing and toilet hygiene. Eats well, no weight loss. Falls frequently per family and recently broke some ribs.  RECOMMENDATIONS and PLAN:  1.Goals of Care- information left re end of life care planning, MOST, DNR. Family will discuss and relate wishes on next palliative visit.  2. Mental /  behavior changes: Safety reinforced as much as possible. Patient has an appointment with Neurology on 11/6 and so it was decided to see what the plan will be after this meeting. Emphasized it would be better to wait until after this consult for any medication changes. Currently has alprazolam from PCP which family uses if needed.    3. Caregiver burden: Caregiver and daughter to look for some respite help, people they know who can sit with patient, hiring help, and looking into adult day services and [facility] for eligibility. Patient states she will give adult day another chance. Will have SW phone to discuss any further services available.   4. Mobility : Continue with home health PT and encouraged compliance with HEP and use of gait belt. Patient has a ramp and can go out in car by wheelchair. Encouraged to maintain function and not remain in her chair during day.  I spent 45  minutes providing this consultation,  from 1000 to 1045. More than 50% of the time in this consultation was spent coordinating communication.   HISTORY OF PRESENT ILLNESS:  [name] is a 84 y.o. year old female with multiple medical problems including dementia with behavior disturbances, osteoarthritis, frequent falls, h/o  fractures and joint replacements. Family is overwhelmed with physical and emotional care demands and desires goals of care planning. Palliative Care was asked to help address goals of care.   CODE STATUS: Full Code  PPS: 40% HOSPICE ELIGIBILITY/DIAGNOSIS: TBD  PAST MEDICAL  HISTORY:  Past Medical History:  Diagnosis Date  . Anemia   . Arthritis    knee  . Diverticulosis   . Hx of colonic polyps 10/24/2014  . Hyperlipidemia   . Hypertension   . Osteopenia   . Overactive bladder    urge incontinence  . PSVT (paroxysmal supraventricular tachycardia) ([place])   . Pulmonary emboli ([place])   . Pulmonary hypertension ([place])   . Right ventricular failure ([place])   . Sleep apnea    CPAP  . Spinal stenosis      SOCIAL HX:  Social History   Tobacco Use  . Smoking status: Never Smoker  . Smokeless tobacco: Never Used  Substance Use Topics  . Alcohol use: No    Alcohol/week: 0.0 standard drinks    ALLERGIES:  Allergies  Allergen Reactions  . Prednisone Other (See Comments)    hyperctivity  . Zithromax [Azithromycin] Other (See Comments)    Prolonged QT on EKG  . Penicillins Hives and Swelling    Questionable allergy (?) Currently on cefazolin, tolerating well since 4/21 Patient is tolerating dicloxacillin without issue. No skin changes or other concerns.   . Sulfonamide Derivatives Hives  . Adhesive [Tape] Other (See Comments) and Rash    redness     PERTINENT MEDICATIONS:  Outpatient Encounter Medications as of 82/06/2018  Medication Sig  . ALPRAZolam (XANAX) 0.5 MG tablet Take 1 tablet (0.5 mg total) by mouth 2 (two) times daily as needed for anxiety. Caution of sedation  . benzonatate (TESSALON) 200 MG capsule Take 1 capsule (200 mg total) by mouth 2 (two) times daily as needed for cough.  . Calcium Carb-Cholecalciferol (CALCIUM 600/VITAMIN D3) 600-800 MG-UNIT TABS Take 1 tablet by mouth 2 (two) times daily.  . cephALEXin (KEFLEX) 500 MG capsule Take 500 mg by mouth 3 (three) times daily.   . Cholecalciferol (VITAMIN D-1000 MAX ST) 1000 UNITS tablet Take 1,000 Units by mouth daily.  Marland Kitchen FLUoxetine (PROZAC) 40 MG capsule Take 1 capsule (40 mg total) by mouth daily.  . hydrochlorothiazide (HYDRODIURIL) 25 MG tablet TAKE 1 TABLET DAILY  . levothyroxine (SYNTHROID, LEVOTHROID) 50 MCG tablet Take 1 tablet (50 mcg total) by mouth daily.  . metoprolol tartrate (LOPRESSOR) 25 MG tablet Take 0.5 tablets (12.5 mg total) by mouth 2 (two) times daily.  . naproxen sodium (ANAPROX) 220 MG tablet Take 220 mg by mouth daily as needed (pain).   . OXYGEN Inhale 2 L into the lungs at bedtime.  Vladimir Faster Glycol-Propyl Glycol (SYSTANE) 0.4-0.3 % SOLN Apply 1 drop to eye 2 (two) times daily.  .  ranitidine (ZANTAC) 150 MG tablet Take 1 tablet (150 mg total) by mouth 2 (two) times daily.   No facility-administered encounter medications on file as of 82/06/2018.     PHYSICAL EXAM:   General: NAD, WNWD. VS = 99.1-53-18 142/84 Ox Sat 95% RA Cardiovascular: regular rate and rhythm, S1,S2  Pulmonary: clear all fields. States chronic cough Abdomen: soft, nontender, + bowel sounds Extremities: no edema, wound in left knee joint, draining baker's cyst Skin: see above, no other breakdown, lesions. Neurological: Weakness with ambulation but otherwise nonfocal  Cyndia Skeeters, AGPCNP-BC

## 2018-08-05 ENCOUNTER — Inpatient Hospital Stay
Admission: EM | Admit: 2018-08-05 | Discharge: 2018-08-10 | DRG: 552 | Disposition: A | Discharge status: Skilled Nursing Facility | Payer: Medicare Other | Attending: Internal Medicine | Admitting: Internal Medicine

## 2018-08-05 ENCOUNTER — Ambulatory Visit: Payer: Medicare Other | Admitting: Nurse Practitioner

## 2018-08-05 ENCOUNTER — Encounter: Payer: Self-pay | Admitting: Intensive Care

## 2018-08-05 ENCOUNTER — Other Ambulatory Visit: Payer: Self-pay

## 2018-08-05 ENCOUNTER — Emergency Department: Payer: Medicare Other

## 2018-08-05 DIAGNOSIS — S22071A Stable burst fracture of T9-T10 vertebra, initial encounter for closed fracture: Principal | ICD-10-CM | POA: Diagnosis present

## 2018-08-05 DIAGNOSIS — K219 Gastro-esophageal reflux disease without esophagitis: Secondary | ICD-10-CM | POA: Diagnosis present

## 2018-08-05 DIAGNOSIS — W010XXA Fall on same level from slipping, tripping and stumbling without subsequent striking against object, initial encounter: Secondary | ICD-10-CM | POA: Diagnosis present

## 2018-08-05 DIAGNOSIS — Z79890 Hormone replacement therapy (postmenopausal): Secondary | ICD-10-CM

## 2018-08-05 DIAGNOSIS — I4891 Unspecified atrial fibrillation: Secondary | ICD-10-CM | POA: Diagnosis present

## 2018-08-05 DIAGNOSIS — Z8249 Family history of ischemic heart disease and other diseases of the circulatory system: Secondary | ICD-10-CM

## 2018-08-05 DIAGNOSIS — K2980 Duodenitis without bleeding: Secondary | ICD-10-CM | POA: Diagnosis present

## 2018-08-05 DIAGNOSIS — Z833 Family history of diabetes mellitus: Secondary | ICD-10-CM

## 2018-08-05 DIAGNOSIS — M4814 Ankylosing hyperostosis [Forestier], thoracic region: Secondary | ICD-10-CM | POA: Diagnosis present

## 2018-08-05 DIAGNOSIS — K279 Peptic ulcer, site unspecified, unspecified as acute or chronic, without hemorrhage or perforation: Secondary | ICD-10-CM

## 2018-08-05 DIAGNOSIS — Z803 Family history of malignant neoplasm of breast: Secondary | ICD-10-CM

## 2018-08-05 DIAGNOSIS — M8580 Other specified disorders of bone density and structure, unspecified site: Secondary | ICD-10-CM | POA: Diagnosis present

## 2018-08-05 DIAGNOSIS — E039 Hypothyroidism, unspecified: Secondary | ICD-10-CM | POA: Diagnosis present

## 2018-08-05 DIAGNOSIS — Z8049 Family history of malignant neoplasm of other genital organs: Secondary | ICD-10-CM

## 2018-08-05 DIAGNOSIS — F0390 Unspecified dementia without behavioral disturbance: Secondary | ICD-10-CM | POA: Diagnosis present

## 2018-08-05 DIAGNOSIS — I700 Atherosclerosis of aorta: Secondary | ICD-10-CM | POA: Diagnosis present

## 2018-08-05 DIAGNOSIS — Z9981 Dependence on supplemental oxygen: Secondary | ICD-10-CM

## 2018-08-05 DIAGNOSIS — G4733 Obstructive sleep apnea (adult) (pediatric): Secondary | ICD-10-CM | POA: Diagnosis present

## 2018-08-05 DIAGNOSIS — S22079A Unspecified fracture of T9-T10 vertebra, initial encounter for closed fracture: Secondary | ICD-10-CM | POA: Diagnosis present

## 2018-08-05 DIAGNOSIS — R739 Hyperglycemia, unspecified: Secondary | ICD-10-CM | POA: Diagnosis present

## 2018-08-05 DIAGNOSIS — Z96653 Presence of artificial knee joint, bilateral: Secondary | ICD-10-CM | POA: Diagnosis present

## 2018-08-05 DIAGNOSIS — S80212A Abrasion, left knee, initial encounter: Secondary | ICD-10-CM | POA: Diagnosis present

## 2018-08-05 DIAGNOSIS — S2241XD Multiple fractures of ribs, right side, subsequent encounter for fracture with routine healing: Secondary | ICD-10-CM

## 2018-08-05 DIAGNOSIS — S22009A Unspecified fracture of unspecified thoracic vertebra, initial encounter for closed fracture: Secondary | ICD-10-CM

## 2018-08-05 DIAGNOSIS — Z66 Do not resuscitate: Secondary | ICD-10-CM | POA: Diagnosis present

## 2018-08-05 DIAGNOSIS — R509 Fever, unspecified: Secondary | ICD-10-CM | POA: Diagnosis present

## 2018-08-05 DIAGNOSIS — Z86711 Personal history of pulmonary embolism: Secondary | ICD-10-CM

## 2018-08-05 DIAGNOSIS — R1310 Dysphagia, unspecified: Secondary | ICD-10-CM | POA: Diagnosis present

## 2018-08-05 DIAGNOSIS — K2900 Acute gastritis without bleeding: Secondary | ICD-10-CM

## 2018-08-05 DIAGNOSIS — E785 Hyperlipidemia, unspecified: Secondary | ICD-10-CM | POA: Diagnosis present

## 2018-08-05 DIAGNOSIS — M4854XA Collapsed vertebra, not elsewhere classified, thoracic region, initial encounter for fracture: Secondary | ICD-10-CM | POA: Diagnosis not present

## 2018-08-05 LAB — URINALYSIS, COMPLETE (UACMP) WITH MICROSCOPIC
BACTERIA UA: NONE SEEN
BILIRUBIN URINE: NEGATIVE
GLUCOSE, UA: NEGATIVE mg/dL
HGB URINE DIPSTICK: NEGATIVE
KETONES UR: 20 mg/dL — AB
LEUKOCYTES UA: NEGATIVE
Nitrite: NEGATIVE
PROTEIN: 30 mg/dL — AB
Specific Gravity, Urine: 1.017 (ref 1.005–1.030)
pH: 7 (ref 5.0–8.0)

## 2018-08-05 LAB — INFLUENZA PANEL BY PCR (TYPE A & B)
INFLAPCR: NEGATIVE
Influenza B By PCR: NEGATIVE

## 2018-08-05 LAB — COMPREHENSIVE METABOLIC PANEL
ALBUMIN: 4 g/dL (ref 3.5–5.0)
ALT: 13 U/L (ref 0–44)
ANION GAP: 9 (ref 5–15)
AST: 22 U/L (ref 15–41)
Alkaline Phosphatase: 61 U/L (ref 38–126)
BILIRUBIN TOTAL: 1 mg/dL (ref 0.3–1.2)
BUN: 19 mg/dL (ref 8–23)
CHLORIDE: 99 mmol/L (ref 98–111)
CO2: 28 mmol/L (ref 22–32)
Calcium: 9.3 mg/dL (ref 8.9–10.3)
Creatinine, Ser: 0.79 mg/dL (ref 0.44–1.00)
GFR calc Af Amer: >60 mL/min (ref >60)
Glucose, Bld: 111 mg/dL — ABNORMAL HIGH (ref 70–99)
POTASSIUM: 3.6 mmol/L (ref 3.5–5.1)
Sodium: 136 mmol/L (ref 135–145)
Total Protein: 7.3 g/dL (ref 6.5–8.1)

## 2018-08-05 LAB — LIPASE, BLOOD: LIPASE: 23 U/L (ref 11–51)

## 2018-08-05 LAB — CBC
HEMATOCRIT: 45 % (ref 36.0–46.0)
HEMOGLOBIN: 14.8 g/dL (ref 12.0–15.0)
MCH: 29.7 pg (ref 26.0–34.0)
MCHC: 32.9 g/dL (ref 30.0–36.0)
MCV: 90.2 fL (ref 80.0–100.0)
Platelets: 245 10*3/uL (ref 150–400)
RBC: 4.99 MIL/uL (ref 3.87–5.11)
RDW: 13.5 % (ref 11.5–15.5)
WBC: 7.3 10*3/uL (ref 4.0–10.5)
nRBC: 0 % (ref 0.0–0.2)

## 2018-08-05 MED ORDER — SODIUM CHLORIDE 0.9 % IV BOLUS
1000.0000 mL | Freq: Once | INTRAVENOUS | Status: AC
  Administered 2018-08-05: 1000 mL via INTRAVENOUS

## 2018-08-05 MED ORDER — IOPAMIDOL (ISOVUE-300) INJECTION 61%
100.0000 mL | Freq: Once | INTRAVENOUS | Status: AC | PRN
  Administered 2018-08-05: 100 mL via INTRAVENOUS

## 2018-08-05 MED ORDER — ONDANSETRON HCL 4 MG/2ML IJ SOLN
4.0000 mg | Freq: Once | INTRAMUSCULAR | Status: AC
  Administered 2018-08-05: 4 mg via INTRAVENOUS
  Filled 2018-08-05: qty 2

## 2018-08-05 NOTE — ED Notes (Signed)
Droplet precautions initiated.

## 2018-08-05 NOTE — ED Provider Notes (Signed)
[facility] Emergency Department Provider Note  ____________________________________________  Time seen: Approximately 10:45 PM  I have reviewed the triage vital signs and the nursing notes.   HISTORY  Chief Complaint Emesis   HPI [name] is a 84 y.o. female with a history of anemia, diverticulosis, afib, hypertension, hyperlipidemia, OSA on CPAP hypothyroidism presents for evaluation of nausea and vomiting.  Patient has had subjective fever, nausea, several episodes of nonbloody nonbilious emesis since last night and body aches.  No diarrhea or constipation.  Patient complaining of dull lower abdominal pain which has been intermittent throughout the day.  No pain at this time.  She has not taken her home meds today due to the nausea and vomiting.  No dysuria or hematuria, no chest pain.  She also complains of a mild cough productive of clear phlegm.  No shortness of breath or chest pain.  Past Medical History:  Diagnosis Date  . Anemia   . Arthritis    knee  . Diverticulosis   . Hx of colonic polyps 10/24/2014  . Hyperlipidemia   . Hypertension   . Osteopenia   . Overactive bladder    urge incontinence  . PSVT (paroxysmal supraventricular tachycardia) ([place])   . Pulmonary emboli ([place])   . Pulmonary hypertension ([place])   . Right ventricular failure ([place])   . Sleep apnea    CPAP  . Spinal stenosis     Patient Active Problem List   Diagnosis Date Noted  . H/O fracture of right hip 07/14/2018  . Estrogen deficiency 07/09/2018  . Screening mammogram, encounter for 07/09/2018  . Mobility impaired 07/09/2018  . Gastritis 07/06/2018  . Numbness in feet 07/06/2018  . Left knee pain 04/07/2018  . Cor pulmonale (chronic) ([place]) 02/08/2018  . ILD (interstitial lung disease) ([place]) 10/20/2017  . Hypothyroid 07/03/2017  . B12 deficiency 07/03/2017  . Encounter for audiology evaluation 07/03/2017  . Encounter for screening mammogram for breast cancer  07/03/2017  . Tearfulness 06/28/2017  . Dementia ([place]) 06/28/2017  . Poor balance 09/26/2016  . Neck pain 07/30/2016  . Diarrhea 07/30/2016  . Routine general medical examination at a health care facility 06/26/2016  . Generalized anxiety disorder 06/04/2016  . Intertrigo 06/04/2016  . Chronic cough 05/01/2016  . Constipation 01/09/2016  . Umbilical hernia 01/77/9390  . Chronic respiratory failure ([place]) 10/05/2015  . Thromboembolism of vein 10/02/2015  . Infection of prosthetic joint ([place]) 09/18/2015  . Infection of prosthetic left knee joint ([place]) 09/12/2015  . Effusion of knee 08/30/2015  . Pulmonary hypertension ([place])   . Physical deconditioning 02/25/2015  . RBBB-new 02/12/2015  . History of bilat PE after TKR (off Coumadin Dec 2015) 02/12/2015  . Abnormal EKG-TWI, RBBB, prolonged QT   . Hernia, umbilical 30/06/2329  . Hx of colonic polyps 10/24/2014  . Diverticulosis of colon without hemorrhage   . OSA (obstructive sleep apnea) 07/22/2014  . Unspecified arthropathy, lower leg 03/21/2014  . Status post surgery 03/15/2014  . Arthritis of knee 03/09/2014  . Solitary pulmonary nodule 05/03/2013  . History of DVT (deep vein thrombosis) 11/24/2012  . History of artificial joint 10/29/2012  . 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 Complications due to internal joint prosthesis ([place]) 09/07/2012  . PES PLANUS 08/15/2010  . URINARY FREQUENCY, CHRONIC 07/14/2008  . Dyslipidemia 07/09/2007  . Essential hypertension 07/09/2007  . PSVT (paroxysmal supraventricular tachycardia) ([place]) 02/23/2007  . DEGENERATIVE JOINT DISEASE 02/23/2007  . SPINAL STENOSIS, LUMBAR 02/23/2007  . History of osteopenia  02/23/2007  . SPONDYLOLISTHESIS 02/23/2007    Past Surgical History:  Procedure Laterality Date  . BLADDER SUSPENSION  1993  . CARDIAC CATHETERIZATION N/A 02/28/2015   Procedure: Right/Left Heart Cath and Coronary Angiography;  Surgeon: Troy Sine, MD;  Location: [facility] INVASIVE CV LAB  CUPID;  Service: Cardiovascular;  Laterality: N/A;  . CATARACT EXTRACTION Bilateral 2008  . CHOLECYSTECTOMY    . COLONOSCOPY    . COLONOSCOPY N/A 10/24/2014   Procedure: COLONOSCOPY;  Surgeon: Gatha Mayer, MD;  Location: [facility] ENDOSCOPY;  Service: Endoscopy;  Laterality: N/A;  . CYST REMOVAL LEG Left 02/15/2016   cyst removal from back of left knee  . ELECTROPHYSIOLOGIC STUDY N/A 03/02/2015   Procedure: SVT Ablation;  Surgeon: Evans Lance, MD;  Location: [facility] CV LAB;  Service: Cardiovascular;  Laterality: N/A;  . INCISIONAL HERNIA REPAIR    . RIGHT HEART CATHETERIZATION N/A 07/04/2014   Procedure: RIGHT HEART CATH;  Surgeon: Jolaine Artist, MD;  Location: [facility] CATH LAB;  Service: Cardiovascular;  Laterality: N/A;  . TONSILLECTOMY AND ADENOIDECTOMY    . TOTAL KNEE ARTHROPLASTY Left 2000  . TOTAL KNEE ARTHROPLASTY Left 10/29/2012   at [facility]  . TOTAL KNEE ARTHROPLASTY Right 03/09/2014  . TOTAL KNEE REVISION  01/12/2012   Procedure: TOTAL KNEE REVISION;  Surgeon: Kerin Salen, MD;  Location: [facility];  Service: Orthopedics;  Laterality: Left;  DEPUY/ LCS , HAND SET  . TRIGGER FINGER RELEASE  01/12/2012   Procedure: RELEASE TRIGGER FINGER/A-1 PULLEY;  Surgeon: Kerin Salen, MD;  Location: [facility];  Service: Orthopedics;  Laterality: Right;    Prior to Admission medications   Medication Sig Start Date End Date Taking? Authorizing Provider  ALPRAZolam Duanne Moron) 0.5 MG tablet Take 1 tablet (0.5 mg total) by mouth 2 (two) times daily as needed for anxiety. Caution of sedation 07/06/18   Tower, Wynelle Fanny, MD  benzonatate (TESSALON) 200 MG capsule Take 1 capsule (200 mg total) by mouth 2 (two) times daily as needed for cough. 11/24/17   Rigoberto Noel, MD  Calcium Carb-Cholecalciferol (CALCIUM 600/VITAMIN D3) 600-800 MG-UNIT TABS Take 1 tablet by mouth 2 (two) times daily.    [provider]  cephALEXin (KEFLEX) 500 MG capsule Take 500 mg by mouth 3 (three) times daily.  06/12/17   [provider]  Cholecalciferol (VITAMIN D-1000 MAX ST) 1000 UNITS tablet Take 1,000 Units by mouth daily. 09/08/15   [provider]  famotidine (PEPCID) 20 MG tablet Take 1 tablet (20 mg total) by mouth 2 (two) times daily. 08/06/18 08/06/19  Rudene Re, MD  FLUoxetine (PROZAC) 40 MG capsule Take 1 capsule (40 mg total) by mouth daily. 04/07/18   Tower, Wynelle Fanny, MD  hydrochlorothiazide (HYDRODIURIL) 25 MG tablet TAKE 1 TABLET DAILY 07/05/18   Tower, Wynelle Fanny, MD  levothyroxine (SYNTHROID, LEVOTHROID) 50 MCG tablet Take 1 tablet (50 mcg total) by mouth daily. 07/09/18   Tower, Wynelle Fanny, MD  metoprolol tartrate (LOPRESSOR) 25 MG tablet Take 0.5 tablets (12.5 mg total) by mouth 2 (two) times daily. 07/02/18   Allred, Jeneen Rinks, MD  naproxen sodium (ANAPROX) 220 MG tablet Take 220 mg by mouth daily as needed (pain).     [provider]  ondansetron (ZOFRAN ODT) 4 MG disintegrating tablet Take 1 tablet (4 mg total) by mouth every 8 (eight) hours as needed for nausea or vomiting. 08/06/18   Rudene Re, MD  OXYGEN Inhale 2 L into the lungs at bedtime.  [provider]  Polyethyl Glycol-Propyl Glycol (SYSTANE) 0.4-0.3 % SOLN Apply 1 drop to eye 2 (two) times daily.    [provider]  ranitidine (ZANTAC) 150 MG tablet Take 1 tablet (150 mg total) by mouth 2 (two) times daily. 07/22/18   Tower, Wynelle Fanny, MD    Allergies Prednisone; Zithromax [azithromycin]; Penicillins; Sulfonamide derivatives; and Adhesive [tape]  Family History  Problem Relation Age of Onset  . Breast cancer Mother 66  . Heart disease Father   . Diabetes Father   . Hypertension Father   . Uterine cancer Sister     Social History Social History   Tobacco Use  . Smoking status: Never Smoker  . Smokeless tobacco: Never Used  Substance Use Topics  . Alcohol use: No    Alcohol/week: 0.0 standard drinks  . Drug use: No    Review of Systems  Constitutional: + subjective fever,  chills, body aches Eyes: Negative for visual changes. ENT: Negative for sore throat. Neck: No neck pain  Cardiovascular: Negative for chest pain. Respiratory: Negative for shortness of breath. + cough Gastrointestinal: + abdominal pain, nausea, and vomiting. No diarrhea. Genitourinary: Negative for dysuria. Musculoskeletal: Negative for back pain. Skin: Negative for rash. Neurological: Negative for headaches, weakness or numbness. Psych: No SI or HI  ____________________________________________   PHYSICAL EXAM:  VITAL SIGNS: ED Triage Vitals  Enc Vitals Group     BP 08/05/18 1652 (!) 146/89     Pulse Rate 08/05/18 1652 (!) 58     Resp 08/05/18 1652 16     Temp 08/05/18 1652 98.4 F (36.9 C)     Temp Source 08/05/18 1652 Oral     SpO2 08/05/18 1652 93 %     Weight 08/05/18 1647 204 lb (92.5 kg)     Height 08/05/18 1647 5\' 6"  (1.676 m)     Head Circumference --      Peak Flow --      Pain Score 08/05/18 1647 0     Pain Loc --      Pain Edu? --      Excl. in [place]? --     Constitutional: Alert and oriented. Well appearing and in no apparent distress. HEENT:      Head: Normocephalic and atraumatic.         Eyes: Conjunctivae are normal. Sclera is non-icteric.       Mouth/Throat: Mucous membranes are moist.       Neck: Supple with no signs of meningismus. Cardiovascular: Regular rate and rhythm. No murmurs, gallops, or rubs. 2+ symmetrical distal pulses are present in all extremities. No JVD. Respiratory: Normal respiratory effort. Lungs are clear to auscultation bilaterally. No wheezes, crackles, or rhonchi.  Gastrointestinal: Soft, non tender, and non distended with positive bowel sounds. No rebound or guarding. Genitourinary: No CVA tenderness. Musculoskeletal: Nontender with normal range of motion in all extremities. No edema, cyanosis, or erythema of extremities. Neurologic: Normal speech and language. Face is symmetric. Moving all extremities. No gross focal neurologic  deficits are appreciated. Skin: Skin is warm, dry and intact. No rash noted. Psychiatric: Mood and affect are normal. Speech and behavior are normal.  ____________________________________________   LABS (all labs ordered are listed, but only abnormal results are displayed)  Labs Reviewed  COMPREHENSIVE METABOLIC PANEL - Abnormal; Notable for the following components:      Result Value   Glucose, Bld 111 (*)    All other components within normal limits  URINALYSIS, COMPLETE (UACMP) WITH MICROSCOPIC -  Abnormal; Notable for the following components:   Color, Urine YELLOW (*)    APPearance CLEAR (*)    Ketones, ur 20 (*)    Protein, ur 30 (*)    All other components within normal limits  LIPASE, BLOOD  CBC  INFLUENZA PANEL BY PCR (TYPE A & B)   ____________________________________________  EKG  ED ECG REPORT I, Rudene Re, the attending physician, personally viewed and interpreted this ECG.   Sinus rhythm with frequent PACs, rate of 81, normal intervals, left axis deviation, no ST elevations or depressions. ____________________________________________  RADIOLOGY  I have personally reviewed the images performed during this visit and I agree with the Radiologist's read.   Interpretation by Radiologist:  Dg Chest 2 View  Result Date: 08/05/2018 CLINICAL DATA:  Initial evaluation for acute nausea, vomiting, body aches. EXAM: CHEST - 2 VIEW COMPARISON:  Prior radiograph from 06/26/2018. FINDINGS: Mild cardiomegaly, stable. Mediastinal silhouette within normal limits. Lungs normally inflated. Mild perihilar vascular congestion without overt pulmonary edema. No pleural effusion. No focal infiltrates. No pneumothorax. No acute osseous abnormality.  Osteopenia noted. IMPRESSION: 1. Mild cardiomegaly with pulmonary venous congestion without overt pulmonary edema. 2. No other active cardiopulmonary disease. Electronically Signed   By: Jeannine Boga M.D.   On: 08/05/2018  23:05   Ct Abdomen Pelvis W Contrast  Result Date: 08/05/2018 CLINICAL DATA:  Generalized abdominal pain with nausea and vomiting and body aches since last night. EXAM: CT ABDOMEN AND PELVIS WITH CONTRAST TECHNIQUE: Multidetector CT imaging of the abdomen and pelvis was performed using the standard protocol following bolus administration of intravenous contrast. CONTRAST:  162mL ISOVUE-300 IOPAMIDOL (ISOVUE-300) INJECTION 61% COMPARISON:  Multiple exams, including CT right hip from 06/04/2018 and abdominal radiographs from 06/26/2017 FINDINGS: Lower chest: Mild cardiomegaly. Cylindrical bronchiectasis in the right lower lobe. Scarring or mild atelectasis in the right middle lobe and lingula. Hepatobiliary: Slight nodularity of the hepatic margin suspicious for cirrhosis. Cholecystectomy. Pancreas: There is stranding along the pancreaticoduodenal groove, although I feel this is more likely to be due to proximal duodenal inflammation than pancreatitis. Spleen: Unremarkable Adrenals/Urinary Tract: 2 mm right kidney lower pole nonobstructive renal calculus on image 33/2. Punctate calcifications anterior to the left psoas muscle for example on image 40/2 are thought to be venous rather than ureteral. 0.8 by 0.6 cm hypodense lesion in the right mid kidney on image 19/7 is statistically likely to be a cyst but technically nonspecific due to small size. Stomach/Bowel: Wall thickening along the descending duodenum with surrounding edema/fluid. There is potentially some wall thickening in the stomach antrum as well. Sigmoid colon diverticulosis.  No active diverticulitis. Twisted loop left upper quadrant small bowel shown on image 35/5, with an internal air-fluid level measuring up to 3.7 cm in diameter. Vascular/Lymphatic: Aortoiliac atherosclerotic vascular disease. Reproductive: Soft tissue prominence of the anterior uterus favoring uterine fibroid. Other: No supplemental non-categorized findings. Musculoskeletal: 6.8  cm in diameter umbilical hernia containing adipose tissue extending to the left of midline. Subacute fractures of the right eighth, ninth, tenth, eleventh, twelfth fractures. Several of these are displaced. There are segmental fractures of the right tenth and eleventh ribs. Higher ribs could be fractured but are not included on imaging. There is an oblique fracture through the body of the T10 vertebral body, with associated sclerosis, only partially included on today's exam. Inferior endplate compression fractures at T12 and L1, probably old. Notable hemangioma eccentric to the right in the L1 vertebra. Degenerative arthropathy of the hips, right greater than  left. IMPRESSION: 1. Considerable abnormal wall thickening in the proximal duodenum and stomach antrum with surrounding edema. Favor inflammation such as in the setting of peptic ulcer disease over groove pancreatitis as a cause. 2. Multiple acute or subacute right lower subacute rib fractures. Some of these are segmental and displaced. Query recent trauma. 3. subacute oblique fracture through the T10 vertebral body. Dedicated imaging of the thoracic spine is probably warranted to further workup this injury and assess for other injuries. 4. There is a twisted loop of small bowel in the left upper quadrant, with an internal air-fluid level. No extraluminal gas or obvious vascular compromise at this time. No compelling findings of obstruction. 5. Other imaging findings of potential clinical significance: Mild cardiomegaly. Cylindrical bronchiectasis in the right lower lobe. Mild bibasilar scarring. Hepatic cirrhosis. Nonobstructive right nephrolithiasis. Sigmoid colon diverticulosis. Aortic Atherosclerosis (ICD10-I70.0). 6.8 cm umbilical hernia containing adipose tissue eccentric to the left. Old compression fractures at T12 and L1. Degenerative hip arthropathy, right greater than left. Electronically Signed   By: Van Clines M.D.   On: 08/05/2018 23:57      ____________________________________________   PROCEDURES  Procedure(s) performed: None Procedures Critical Care performed:  None ____________________________________________   INITIAL IMPRESSION / ASSESSMENT AND PLAN / ED COURSE   84 y.o. female with a history of anemia, diverticulosis, hypertension, hyperlipidemia, OSA on CPAP hypothyroidism presents for evaluation of nausea and vomiting, subjective fever, body aches, and intermittent abdominal pain.  Patient is well-appearing, in no distress, she has normal vital signs, lungs are clear to auscultation with normal work of breathing, abdomen is soft with no tenderness throughout.  EKG with dysrhythmias or ischemia. Labs show normal CBC with no leukocytosis or anemia, normal CMP, normal lipase, normal urinalysis.  Differential diagnoses including but not limited to viral illness versus gastritis versus gastroenteritis versus flu versus pneumonia versus diverticulitis. Flu, CXR, and CT ab/p pending. Will give zofran and IVF and reassess.  Clinical Course as of Aug 07 23  Fri Aug 06, 2018  0017 Flu negative, UA negative for UTI, CT abdomen pelvis showing subacute rib fractures which were there from a fall 6 weeks ago.  CT was also concerning for acute/subacute T10 vertebral body fracture. Patient reports a fall a few days ago where she was coming out of a car and lost her balance and fell onto her buttock.  She denies head trauma or back trauma.  She denies any back pain at this time, saddle anesthesia, urinary or bowel incontinence or retention, weakness or paresthesias of her lower extremities.  Per recommendation of radiologist CT of the thoracic spine has been ordered.  Care transferred to Dr. Mable Paris.   [CV]    Clinical Course User Index [CV] Alfred Levins [place], MD     As part of my medical decision making, I reviewed the following data within the [place] History obtained from family, Nursing notes reviewed  and incorporated, Labs reviewed , EKG interpreted , Old EKG reviewed, Old chart reviewed, Radiograph reviewed , Notes from prior ED visits and [place] Controlled Substance Database    Pertinent labs & imaging results that were available during my care of the patient were reviewed by me and considered in my medical decision making (see chart for details).    ____________________________________________   FINAL CLINICAL IMPRESSION(S) / ED DIAGNOSES  Final diagnoses:  PUD (peptic ulcer disease)  Acute gastritis without hemorrhage, unspecified gastritis type  Closed fracture of body of posterior thoracic vertebra, initial encounter ([place])  NEW MEDICATIONS STARTED DURING THIS VISIT:  ED Discharge Orders         Ordered    famotidine (PEPCID) 20 MG tablet  2 times daily     08/06/18 0020    ondansetron (ZOFRAN ODT) 4 MG disintegrating tablet  Every 8 hours PRN     08/06/18 0020           Note:  This document was prepared using Dragon voice recognition software and may include unintentional dictation errors.    Rudene Re, MD 08/06/18 [phone]

## 2018-08-05 NOTE — ED Notes (Signed)
Pt placed on a bedpan.  

## 2018-08-05 NOTE — ED Notes (Signed)
Pt to CT. ABCs intact. NAD 

## 2018-08-05 NOTE — ED Notes (Addendum)
Pt to XR. ABCs intact. NAD.  2238 Pt returned from XR.

## 2018-08-05 NOTE — ED Triage Notes (Signed)
Patients reports N/V with body aches since last night. Denies diarrhea. Denies taking any OTC meds today.

## 2018-08-05 NOTE — ED Notes (Signed)
Pt asked again for urine sample and family and pt state she is incontinent and unable to give sample at this time

## 2018-08-06 ENCOUNTER — Inpatient Hospital Stay: Payer: Medicare Other

## 2018-08-06 ENCOUNTER — Encounter: Payer: Self-pay | Admitting: Internal Medicine

## 2018-08-06 ENCOUNTER — Emergency Department: Payer: Medicare Other

## 2018-08-06 DIAGNOSIS — G4733 Obstructive sleep apnea (adult) (pediatric): Secondary | ICD-10-CM | POA: Diagnosis present

## 2018-08-06 DIAGNOSIS — Z86711 Personal history of pulmonary embolism: Secondary | ICD-10-CM | POA: Diagnosis not present

## 2018-08-06 DIAGNOSIS — I700 Atherosclerosis of aorta: Secondary | ICD-10-CM | POA: Diagnosis present

## 2018-08-06 DIAGNOSIS — M4814 Ankylosing hyperostosis [Forestier], thoracic region: Secondary | ICD-10-CM | POA: Diagnosis present

## 2018-08-06 DIAGNOSIS — K2980 Duodenitis without bleeding: Secondary | ICD-10-CM | POA: Diagnosis present

## 2018-08-06 DIAGNOSIS — R739 Hyperglycemia, unspecified: Secondary | ICD-10-CM | POA: Diagnosis present

## 2018-08-06 DIAGNOSIS — K2900 Acute gastritis without bleeding: Secondary | ICD-10-CM | POA: Diagnosis present

## 2018-08-06 DIAGNOSIS — Z96653 Presence of artificial knee joint, bilateral: Secondary | ICD-10-CM | POA: Diagnosis present

## 2018-08-06 DIAGNOSIS — M8580 Other specified disorders of bone density and structure, unspecified site: Secondary | ICD-10-CM | POA: Diagnosis present

## 2018-08-06 DIAGNOSIS — S22071A Stable burst fracture of T9-T10 vertebra, initial encounter for closed fracture: Secondary | ICD-10-CM | POA: Diagnosis present

## 2018-08-06 DIAGNOSIS — M4854XA Collapsed vertebra, not elsewhere classified, thoracic region, initial encounter for fracture: Secondary | ICD-10-CM | POA: Diagnosis present

## 2018-08-06 DIAGNOSIS — Z66 Do not resuscitate: Secondary | ICD-10-CM | POA: Diagnosis present

## 2018-08-06 DIAGNOSIS — Z803 Family history of malignant neoplasm of breast: Secondary | ICD-10-CM | POA: Diagnosis not present

## 2018-08-06 DIAGNOSIS — S80212A Abrasion, left knee, initial encounter: Secondary | ICD-10-CM | POA: Diagnosis present

## 2018-08-06 DIAGNOSIS — R509 Fever, unspecified: Secondary | ICD-10-CM | POA: Diagnosis present

## 2018-08-06 DIAGNOSIS — Z8049 Family history of malignant neoplasm of other genital organs: Secondary | ICD-10-CM | POA: Diagnosis not present

## 2018-08-06 DIAGNOSIS — K219 Gastro-esophageal reflux disease without esophagitis: Secondary | ICD-10-CM | POA: Diagnosis present

## 2018-08-06 DIAGNOSIS — E039 Hypothyroidism, unspecified: Secondary | ICD-10-CM | POA: Diagnosis present

## 2018-08-06 DIAGNOSIS — S22079A Unspecified fracture of T9-T10 vertebra, initial encounter for closed fracture: Secondary | ICD-10-CM | POA: Diagnosis present

## 2018-08-06 DIAGNOSIS — Z79890 Hormone replacement therapy (postmenopausal): Secondary | ICD-10-CM | POA: Diagnosis not present

## 2018-08-06 DIAGNOSIS — Z8249 Family history of ischemic heart disease and other diseases of the circulatory system: Secondary | ICD-10-CM | POA: Diagnosis not present

## 2018-08-06 DIAGNOSIS — S2241XD Multiple fractures of ribs, right side, subsequent encounter for fracture with routine healing: Secondary | ICD-10-CM | POA: Diagnosis not present

## 2018-08-06 DIAGNOSIS — I4891 Unspecified atrial fibrillation: Secondary | ICD-10-CM | POA: Diagnosis present

## 2018-08-06 DIAGNOSIS — F0390 Unspecified dementia without behavioral disturbance: Secondary | ICD-10-CM | POA: Diagnosis present

## 2018-08-06 DIAGNOSIS — W010XXA Fall on same level from slipping, tripping and stumbling without subsequent striking against object, initial encounter: Secondary | ICD-10-CM | POA: Diagnosis present

## 2018-08-06 DIAGNOSIS — R1310 Dysphagia, unspecified: Secondary | ICD-10-CM | POA: Diagnosis present

## 2018-08-06 DIAGNOSIS — Z9981 Dependence on supplemental oxygen: Secondary | ICD-10-CM | POA: Diagnosis not present

## 2018-08-06 MED ORDER — CALCIUM CARBONATE-VITAMIN D 500-200 MG-UNIT PO TABS
1.0000 | ORAL_TABLET | Freq: Two times a day (BID) | ORAL | Status: DC
  Administered 2018-08-06 – 2018-08-10 (×9): 1 via ORAL
  Filled 2018-08-06 (×9): qty 1

## 2018-08-06 MED ORDER — ENOXAPARIN SODIUM 40 MG/0.4ML ~~LOC~~ SOLN
40.0000 mg | SUBCUTANEOUS | Status: DC
  Administered 2018-08-06 – 2018-08-10 (×5): 40 mg via SUBCUTANEOUS
  Filled 2018-08-06 (×5): qty 0.4

## 2018-08-06 MED ORDER — LACTATED RINGERS IV SOLN: INTRAVENOUS | Status: AC

## 2018-08-06 MED ORDER — BISACODYL 5 MG PO TBEC: 5.0000 mg | DELAYED_RELEASE_TABLET | Freq: Every day | ORAL | Status: DC | PRN

## 2018-08-06 MED ORDER — ALPRAZOLAM 0.5 MG PO TABS
0.5000 mg | ORAL_TABLET | Freq: Two times a day (BID) | ORAL | Status: DC | PRN
  Administered 2018-08-07 – 2018-08-10 (×5): 0.5 mg via ORAL
  Filled 2018-08-06 (×5): qty 1

## 2018-08-06 MED ORDER — CEPHALEXIN 500 MG PO CAPS
500.0000 mg | ORAL_CAPSULE | Freq: Three times a day (TID) | ORAL | Status: DC
  Administered 2018-08-06 (×2): 500 mg via ORAL
  Filled 2018-08-06 (×2): qty 1

## 2018-08-06 MED ORDER — ONDANSETRON HCL 4 MG PO TABS: 4.0000 mg | ORAL_TABLET | Freq: Four times a day (QID) | ORAL | Status: DC | PRN

## 2018-08-06 MED ORDER — FAMOTIDINE 20 MG PO TABS: 20.0000 mg | ORAL_TABLET | Freq: Two times a day (BID) | ORAL | 1 refills | Status: AC

## 2018-08-06 MED ORDER — FAMOTIDINE 20 MG PO TABS
20.0000 mg | ORAL_TABLET | Freq: Two times a day (BID) | ORAL | Status: DC
  Administered 2018-08-06 – 2018-08-10 (×9): 20 mg via ORAL
  Filled 2018-08-06 (×9): qty 1

## 2018-08-06 MED ORDER — SENNOSIDES-DOCUSATE SODIUM 8.6-50 MG PO TABS: 1.0000 | ORAL_TABLET | Freq: Every evening | ORAL | Status: DC | PRN

## 2018-08-06 MED ORDER — ONDANSETRON 4 MG PO TBDP: 4.0000 mg | ORAL_TABLET | Freq: Three times a day (TID) | ORAL | 0 refills | Status: AC | PRN

## 2018-08-06 MED ORDER — ONDANSETRON HCL 4 MG/2ML IJ SOLN: 4.0000 mg | Freq: Four times a day (QID) | INTRAMUSCULAR | Status: DC | PRN

## 2018-08-06 MED ORDER — PANTOPRAZOLE SODIUM 40 MG PO TBEC
40.0000 mg | DELAYED_RELEASE_TABLET | Freq: Every day | ORAL | Status: DC
  Administered 2018-08-06: 40 mg via ORAL
  Filled 2018-08-06: qty 1

## 2018-08-06 MED ORDER — CALCIUM CARB-CHOLECALCIFEROL 600-800 MG-UNIT PO TABS: 1.0000 | ORAL_TABLET | Freq: Two times a day (BID) | ORAL | Status: DC

## 2018-08-06 MED ORDER — PANTOPRAZOLE SODIUM 40 MG PO TBEC
40.0000 mg | DELAYED_RELEASE_TABLET | Freq: Two times a day (BID) | ORAL | Status: DC
  Administered 2018-08-07 – 2018-08-10 (×6): 40 mg via ORAL
  Filled 2018-08-06 (×6): qty 1

## 2018-08-06 MED ORDER — LEVOTHYROXINE SODIUM 50 MCG PO TABS
50.0000 ug | ORAL_TABLET | Freq: Every day | ORAL | Status: DC
  Administered 2018-08-06 – 2018-08-10 (×5): 50 ug via ORAL
  Filled 2018-08-06 (×5): qty 1

## 2018-08-06 MED ORDER — ACETAMINOPHEN 325 MG PO TABS
650.0000 mg | ORAL_TABLET | Freq: Four times a day (QID) | ORAL | Status: DC | PRN
  Administered 2018-08-09 – 2018-08-10 (×2): 650 mg via ORAL
  Filled 2018-08-06: qty 2

## 2018-08-06 MED ORDER — FLUOXETINE HCL 20 MG PO CAPS
40.0000 mg | ORAL_CAPSULE | Freq: Every day | ORAL | Status: DC
  Administered 2018-08-06 – 2018-08-10 (×5): 40 mg via ORAL
  Filled 2018-08-06 (×6): qty 2

## 2018-08-06 MED ORDER — ACETAMINOPHEN 650 MG RE SUPP: 650.0000 mg | Freq: Four times a day (QID) | RECTAL | Status: DC | PRN

## 2018-08-06 MED ORDER — METOPROLOL TARTRATE 25 MG PO TABS
12.5000 mg | ORAL_TABLET | Freq: Two times a day (BID) | ORAL | Status: DC
  Administered 2018-08-06 – 2018-08-10 (×7): 12.5 mg via ORAL
  Filled 2018-08-06 (×8): qty 1

## 2018-08-06 MED ORDER — VITAMIN D 1000 UNITS PO TABS
1000.0000 [IU] | ORAL_TABLET | Freq: Every day | ORAL | Status: DC
  Administered 2018-08-06 – 2018-08-10 (×4): 1000 [IU] via ORAL
  Filled 2018-08-06 (×4): qty 1

## 2018-08-06 MED ORDER — HYDROCHLOROTHIAZIDE 25 MG PO TABS
25.0000 mg | ORAL_TABLET | Freq: Every day | ORAL | Status: DC
  Administered 2018-08-06 – 2018-08-10 (×4): 25 mg via ORAL
  Filled 2018-08-06 (×5): qty 1

## 2018-08-06 MED ORDER — METOPROLOL TARTRATE 25 MG PO TABS
12.5000 mg | ORAL_TABLET | Freq: Once | ORAL | Status: AC
  Administered 2018-08-06: 12.5 mg via ORAL
  Filled 2018-08-06: qty 1

## 2018-08-06 MED ORDER — HYDROCHLOROTHIAZIDE 25 MG PO TABS: 25.0000 mg | ORAL_TABLET | Freq: Every day | ORAL | Status: DC

## 2018-08-06 MED ORDER — MORPHINE SULFATE (PF) 2 MG/ML IV SOLN: 2.0000 mg | INTRAVENOUS | Status: DC | PRN

## 2018-08-06 NOTE — Evaluation (Signed)
Physical Therapy Evaluation Patient Details Name: [name] MRN: [id number] DOB: 23-Jul-1934 Today's Date: 08/06/2018   History of Present Illness  Pt is an 84 y.o. female presenting to hospital 08/05/18 with N/V and dull lower abdominal pain; also mechanical fall Sunday transferring car to w/c.  Pt admitted with N/V, rib fx's (multiple acute to subacute R lower rib fx's segmental and displaced), T10 fx (subacute oblique fx through T10 vertebral body; also old compression fx T12 and L1), disorientation, and memory loss.  Neurosurgery recommending TLSO brace.  PMH includes 6.8 cm umbilical hernia, home O2 2 L at night, anemia, a-fib, htn, OSA on CPAP, B TKA, overactive bladder, PSVT, PE, pulmonary htn, L LE anterior and posterior wounds.  Clinical Impression  Prior to hospital admission, pt fluctuated with assist levels (sometimes able to stand and walk short distances on own; other times needing a lot of assist); used w/c a lot but also could walk short distances with 4ww.  H/o L knee buckling and falls.  Pt lives with her daughter and son in 1 level home with ramp to enter.  Currently pt is mod assist semi-supine to sit via logrolling; min assist x2 to stand with RW; and min assist x2 to ambulate a few feet bed to recliner with RW.  L knee buckling upon initial attempts at taking steps but with cueing to increase UE support through RW no more knee buckling noted until 2nd attempt to ambulate again (pt appearing fatigued).  No c/o pain during session.  Initial posterior lean noted in standing requiring cueing and assist to shift weight forward and increase UE support through RW.  Pt's daughter reporting pt appearing weaker than baseline.  Pt would benefit from skilled PT to address noted impairments and functional limitations (see below for any additional details).  Upon hospital discharge, recommend pt discharge to [place].    Follow Up Recommendations SNF    Equipment Recommendations  Rolling walker  with 5" wheels;Wheelchair (measurements PT);Wheelchair cushion (measurements PT);3in1 (PT)    Recommendations for Other Services OT consult     Precautions / Restrictions Precautions Precautions: Fall;Back Precaution Comments: L knee and posterior L LE wounds Required Braces or Orthoses: Spinal Brace Spinal Brace: Thoracolumbosacral orthotic Restrictions Weight Bearing Restrictions: No      Mobility  Bed Mobility Overal bed mobility: Needs Assistance Bed Mobility: Sidelying to Sit;Rolling Rolling: Min assist;Mod assist Sidelying to sit: Min assist;Mod assist       General bed mobility comments: vc's for logrolling technique; assist for logrolling; increased time for pt to bring LE's off of bed; assist for trunk  Transfers Overall transfer level: Needs assistance Equipment used: Rolling walker (2 wheeled) Transfers: Sit to/from Stand Sit to Stand: Min assist;+2 physical assistance         General transfer comment: assist to stand from elevated bed (x1 trial) and from recliner (x3 trials); vc's for technique; assist to initiate stand and to come to full upright posture  Ambulation/Gait Ambulation/Gait assistance: Min assist;+2 physical assistance Gait Distance (Feet): 3 Feet Assistive device: Rolling walker (2 wheeled)   Gait velocity: decreased   General Gait Details: pt's L knee initially buckling but with vc's for increasing UE support through RW pt able to walk a few feet bed to recliner with min assist x2; attempted 2nd trial with ambulation but pt's L knee buckling and unable to take any steps forward (pt appearing fatigued) so pt assisted back to recliner  Stairs  Wheelchair Mobility    Modified Rankin (Stroke Patients Only)       Balance Overall balance assessment: Needs assistance Sitting-balance support: Bilateral upper extremity supported;Feet supported Sitting balance-Leahy Scale: Poor Sitting balance - Comments: requires 1-2 UE  support to maintain sitting balance (otherwise min assist to maintain sitting posture d/t posterior and R lean)   Standing balance support: Bilateral upper extremity supported Standing balance-Leahy Scale: Poor Standing balance comment: pt with posterior lean initially standing requiring assist to shift weight forward and increase UE support through RW                             Pertinent Vitals/Pain Pain Assessment: No/denies pain  O2 sats 89% or greater on room air during session but therapist returned pt to 2 L O2 via nasal cannula end of session with O2 sats 95%.  HR WFL during session.    Home Living Family/patient expects to be discharged to:: Private residence Living Arrangements: Children(Daughter and son) Available Help at Discharge: Family(Daughter and son) Type of Home: House Home Access: [place]: One [place]: Grab bars - toilet;Walker - 4 wheels      Prior Function Level of Independence: Needs assistance   Gait / Transfers Assistance Needed: Ambulates short distances (grossly 20-25 feet with RW) or transfers to w/c with RW.  Sometimes needs a lot of assist to stand (if sitting for a while) or can stand on own.  Uses w/c a lot.  ADL's / Homemaking Assistance Needed: Sponge baths  Comments: H/o knees giving out or losing balance.  3 falls in last year.     Hand Dominance        Extremity/Trunk Assessment   Upper Extremity Assessment Upper Extremity Assessment: Generalized weakness(at least 3/5 AROM B elbow flexion/extension and shoulder flexion to 90 degrees)    Lower Extremity Assessment Lower Extremity Assessment: Generalized weakness(at least 3/5 AROM B hip flexion, knee flexion/extension, and DF)    Cervical / Trunk Assessment Cervical / Trunk Assessment: (forward posture)  Communication   Communication: No difficulties  Cognition Arousal/Alertness: Awake/alert Behavior During Therapy: WFL for tasks  assessed/performed Overall Cognitive Status: (Oriented to person, place, and situation)                                        General Comments General comments (skin integrity, edema, etc.): Bandages noted L knee and posterior L leg.  Pt agreeable to PT session.  Pt's daughter present during session.    Exercises  Transfer and gait training   Assessment/Plan    PT Assessment Patient needs continued PT services  PT Problem List Decreased strength;Decreased activity tolerance;Decreased balance;Decreased mobility;Decreased knowledge of use of DME;Decreased knowledge of precautions;Decreased skin integrity       PT Treatment Interventions DME instruction;Gait training;Functional mobility training;Therapeutic activities;Therapeutic exercise;Balance training;Patient/family education    PT Goals (Current goals can be found in the Care Plan section)  Acute Rehab PT Goals Patient Stated Goal: to improve strength and mobility PT Goal Formulation: With patient/family Time For Goal Achievement: 08/20/18 Potential to Achieve Goals: Fair    Frequency 7X/week   Barriers to discharge Decreased caregiver support      Co-evaluation               AM-PAC PT "6 Clicks" Daily Activity  Outcome  Measure Difficulty turning over in bed (including adjusting bedclothes, sheets and blankets)?: Unable Difficulty moving from lying on back to sitting on the side of the bed? : Unable Difficulty sitting down on and standing up from a chair with arms (e.g., wheelchair, bedside commode, etc,.)?: Unable Help needed moving to and from a bed to chair (including a wheelchair)?: Total Help needed walking in hospital room?: Total Help needed climbing 3-5 steps with a railing? : Total 6 Click Score: 6    End of Session Equipment Utilized During Treatment: Gait belt;Back brace(gait belt up high) Activity Tolerance: Patient limited by fatigue Patient left: in chair;with call bell/phone  within reach;with chair alarm set;with family/visitor present Nurse Communication: Mobility status;Precautions PT Visit Diagnosis: Unsteadiness on feet (R26.81);Other abnormalities of gait and mobility (R26.89);Muscle weakness (generalized) (M62.81);History of falling (Z91.81);Difficulty in walking, not elsewhere classified (R26.2)    Time: 1350-1435 PT Time Calculation (min) (ACUTE ONLY): 45 min   Charges:   PT Evaluation $PT Eval Low Complexity: 1 Low PT Treatments $Gait Training: 8-22 mins $Therapeutic Activity: 8-22 mins       Leitha Bleak, PT 08/06/18, 3:33 PM [phone]

## 2018-08-06 NOTE — Progress Notes (Signed)
PT is recommending SNF. Clinical Education officer, museum (CSW) met with patient and her 2 daughters Santiago Glad and [name] at bedside and made them aware of above. They are agreeable to SNF search in [facility] and prefer [facility]. CSW explained that [facility] will have to approve SNF. FL2 complete and faxed out.   CSW presented bed offers to patient and her daughter Shauna Hugh, they chose [facility]. Per Tracie admissions coordinator at [facility] she will start [facility] SNF authorization today. CSW will continue to follow and assist as needed.   [employer], LCSW [phone]

## 2018-08-06 NOTE — ED Notes (Signed)
Pt tolerating water PO

## 2018-08-06 NOTE — ED Provider Notes (Signed)
The patient CT scan is being read as an acute T10 fracture.  On further questioning the patient had another fall at church on Sunday.  She is neuro intact.  I spoke with on-call neurosurgeon Dr. Cari Caraway who recommended an MRI of the thoracic spine and he will have his PA come evaluate the patient in the morning.  I have ordered a TLSO brace.  At this point the patient will require inpatient admission for continued pain management, neurosurgical evaluation, physical therapy evaluation.  I then spoke with the hospitalist who has graciously agreed to admit the patient to his service.   Darel Hong, MD 08/06/18 Rogene [place]

## 2018-08-06 NOTE — ED Notes (Signed)
ED TO INPATIENT HANDOFF REPORT  Name/Age/Gender [name] 84 y.o. female  Code Status Code Status History    Date Active Date Inactive Code Status Order ID Comments User Context   03/02/2015 1840 03/03/2015 2006 DNR [id number]  Evans Lance, MD Inpatient   02/28/2015 1225 03/02/2015 1840 DNR [id number]  Troy Sine, MD Inpatient   02/25/2015 1153 02/28/2015 1225 DNR [id number]  Samella Parr, NP Inpatient   02/10/2015 1717 02/15/2015 2123 Full Code [id number]  Velvet Bathe, MD Inpatient   07/04/2014 1033 07/04/2014 1947 Full Code [id number]  Bensimhon, Shaune Pascal, MD Inpatient   03/14/2014 0218 03/16/2014 1644 DNR [id number]  Barton Dubois, MD ED   04/26/2013 0018 04/26/2013 1743 Full Code [id number]  Rise Patience, MD Inpatient   11/23/2012 0452 11/27/2012 1615 Full Code [id number]  Lorie Phenix, RN Inpatient   01/12/2012 1018 01/15/2012 1435 Full Code [id number]  Corum, Jeani Hawking Rudder, RN Inpatient    Questions for Most Recent Historical Code Status (Order [id number])    Question Answer Comment   In the event of cardiac or respiratory ARREST Do not call a "code blue"    In the event of cardiac or respiratory ARREST Do not perform Intubation, CPR, defibrillation or ACLS    In the event of cardiac or respiratory ARREST Use medication by any route, position, wound care, and other measures to relive pain and suffering. May use oxygen, suction and manual treatment of airway obstruction as needed for comfort.         Advance Directive Documentation     Most Recent Value  Type of Advance Directive  Living will, Healthcare Power of Attorney  Pre-existing out of facility DNR order (yellow form or pink MOST form)  -  "MOST" Form in Place?  -      Home/SNF/Other Home  Chief Complaint Fever/vomiting  Level of Care/Admitting Diagnosis ED Disposition    ED Disposition Condition [place]: [facility] [[id number]]  Level of Care: Med-Surg [16]  Diagnosis: Closed T10  spinal fracture [facility]) [[id number]]  Admitting Physician: Arta Silence [[id number]]  Attending Physician: Arta Silence [[id number]]  Estimated length of stay: past midnight tomorrow  Certification:: I certify this patient will need inpatient services for at least 2 midnights  PT Class (Do Not Modify): Inpatient [101]  PT Acc Code (Do Not Modify): Private [1]       Medical History Past Medical History:  Diagnosis Date  . Anemia   . Arthritis    knee  . Diverticulosis   . Hx of colonic polyps 10/24/2014  . Hyperlipidemia   . Hypertension   . Osteopenia   . Overactive bladder    urge incontinence  . PSVT (paroxysmal supraventricular tachycardia) ([place])   . Pulmonary emboli ([place])   . Pulmonary hypertension ([place])   . Right ventricular failure ([place])   . Sleep apnea    CPAP  . Spinal stenosis     Allergies Allergies  Allergen Reactions  . Prednisone Other (See Comments)    hyperctivity  . Zithromax [Azithromycin] Other (See Comments)    Prolonged QT on EKG  . Penicillins Hives and Swelling    Questionable allergy (?) Currently on cefazolin, tolerating well since 4/21 Patient is tolerating dicloxacillin without issue. No skin changes or other concerns.   . Sulfonamide Derivatives Hives  . Adhesive [Tape] Other (See Comments) and Rash    redness    IV Location/Drains/Wounds Patient Lines/Drains/Airways Status  Active Line/Drains/Airways    Name:   Placement date:   Placement time:   Site:   Days:   Peripheral IV 08/05/18 Left Antecubital   08/05/18    2239    Antecubital   1          Labs/Imaging Results for orders placed or performed during the hospital encounter of 08/05/18 (from the past 48 hour(s))  Lipase, blood     Status: None   Collection Time: 08/05/18  5:04 PM  Result Value Ref Range   Lipase 23 11 - 51 U/L    Comment: Performed at [facility], [address] [place], [place] [address]  Comprehensive metabolic panel     Status: Abnormal    Collection Time: 08/05/18  5:04 PM  Result Value Ref Range   Sodium 136 135 - 145 mmol/L   Potassium 3.6 3.5 - 5.1 mmol/L   Chloride 99 98 - 111 mmol/L   CO2 28 22 - 32 mmol/L   Glucose, Bld 111 (H) 70 - 99 mg/dL   BUN 19 8 - 23 mg/dL   Creatinine, Ser 0.79 0.44 - 1.00 mg/dL   Calcium 9.3 8.9 - 10.3 mg/dL   Total Protein 7.3 6.5 - 8.1 g/dL   Albumin 4.0 3.5 - 5.0 g/dL   AST 22 15 - 41 U/L   ALT 13 0 - 44 U/L   Alkaline Phosphatase 61 38 - 126 U/L   Total Bilirubin 1.0 0.3 - 1.2 mg/dL   GFR calc non Af Amer >60 >60 mL/min   GFR calc Af Amer >60 >60 mL/min    Comment: (NOTE) The eGFR has been calculated using the CKD EPI equation. This calculation has not been validated in all clinical situations. eGFR's persistently <60 mL/min signify possible Chronic Kidney Disease.    Anion gap 9 5 - 15    Comment: Performed at [facility], [address] [place], [place] [address]  CBC     Status: None   Collection Time: 08/05/18  5:04 PM  Result Value Ref Range   WBC 7.3 4.0 - 10.5 K/uL   RBC 4.99 3.87 - 5.11 MIL/uL   Hemoglobin 14.8 12.0 - 15.0 g/dL   HCT 45.0 36.0 - 46.0 %   MCV 90.2 80.0 - 100.0 fL   MCH 29.7 26.0 - 34.0 pg   MCHC 32.9 30.0 - 36.0 g/dL   RDW 13.5 11.5 - 15.5 %   Platelets 245 150 - 400 K/uL   nRBC 0.0 0.0 - 0.2 %    Comment: Performed at [facility], [address] [place], [place] [address]  Urinalysis, Complete w Microscopic     Status: Abnormal   Collection Time: 08/05/18  5:04 PM  Result Value Ref Range   Color, Urine YELLOW (A) YELLOW   APPearance CLEAR (A) CLEAR   Specific Gravity, Urine 1.017 1.005 - 1.030   pH 7.0 5.0 - 8.0   Glucose, UA NEGATIVE NEGATIVE mg/dL   Hgb urine dipstick NEGATIVE NEGATIVE   Bilirubin Urine NEGATIVE NEGATIVE   Ketones, ur 20 (A) NEGATIVE mg/dL   Protein, ur 30 (A) NEGATIVE mg/dL   Nitrite NEGATIVE NEGATIVE   Leukocytes, UA NEGATIVE NEGATIVE   RBC / HPF 6-10 0 - 5 RBC/hpf   WBC, UA 0-5 0 - 5  WBC/hpf   Bacteria, UA NONE SEEN NONE SEEN   Squamous Epithelial / LPF 0-5 0 - 5   Mucus PRESENT     Comment: Performed at  [facility], [address], [place], [place] [address]  Influenza panel by PCR (type A & B)     Status: None   Collection Time: 08/05/18 10:26 PM  Result Value Ref Range   Influenza A By PCR NEGATIVE NEGATIVE   Influenza B By PCR NEGATIVE NEGATIVE    Comment: (NOTE) The Xpert Xpress Flu assay is intended as an aid in the diagnosis of  influenza and should not be used as a sole basis for treatment.  This  assay is FDA approved for nasopharyngeal swab specimens only. Nasal  washings and aspirates are unacceptable for Xpert Xpress Flu testing. Performed at [facility], [address] [place], [place] [address]    Dg Chest 2 View  Result Date: 08/05/2018 CLINICAL DATA:  Initial evaluation for acute nausea, vomiting, body aches. EXAM: CHEST - 2 VIEW COMPARISON:  Prior radiograph from 06/26/2018. FINDINGS: Mild cardiomegaly, stable. Mediastinal silhouette within normal limits. Lungs normally inflated. Mild perihilar vascular congestion without overt pulmonary edema. No pleural effusion. No focal infiltrates. No pneumothorax. No acute osseous abnormality.  Osteopenia noted. IMPRESSION: 1. Mild cardiomegaly with pulmonary venous congestion without overt pulmonary edema. 2. No other active cardiopulmonary disease. Electronically Signed   By: Jeannine Boga M.D.   On: 08/05/2018 23:05   Ct Thoracic Spine Wo Contrast  Result Date: 08/06/2018 CLINICAL DATA:  Back pain and minor trauma EXAM: CT THORACIC SPINE WITHOUT CONTRAST TECHNIQUE: Multidetector CT images of the thoracic were obtained using the standard protocol without intravenous contrast. COMPARISON:  Chest CT 02/22/2015 FINDINGS: Alignment: Normal alignment of the thoracic spine. Vertebrae: There is diffuse idiopathic skeletal hyperostosis with flowing osteophytes leading to fusion of the  thoracic spine from T4-T12. There is an acute fracture through the midportion of the T10 vertebral body that extends to the right posterior aspect of the body but does not breach the spinal canal. There is no extension into the posterior elements and no osseous tension band disruption. Paraspinal and other soft tissues: Small prevertebral hematoma at the T10 level. There is cardiomegaly with calcific aortic atherosclerosis. Disc levels: No traumatic disc herniation is evident. Large posterior osteophyte at T12-L1 causes mild-to-moderate spinal canal narrowing.No other bony spinal canal stenosis. IMPRESSION: 1. Acute fracture of the rigid spine in the context of diffuse idiopathic skeletal hyperostosis. Mildly distracted fracture through the midportion of the T10 vertebral body extends to the posterior right wall. No posterior tension band disruption or extension to the posterior elements. Thoracic spine MRI is recommended. 2. Cardiomegaly and calcific aortic atherosclerosis (ICD10-I70.0). These results were called by telephone at the time of interpretation on 08/06/2018 at 1:40 am to Dr. Beather Arbour , who verbally acknowledged these results. Electronically Signed   By: Ulyses Jarred M.D.   On: 08/06/2018 01:41   Ct Abdomen Pelvis W Contrast  Result Date: 08/05/2018 CLINICAL DATA:  Generalized abdominal pain with nausea and vomiting and body aches since last night. EXAM: CT ABDOMEN AND PELVIS WITH CONTRAST TECHNIQUE: Multidetector CT imaging of the abdomen and pelvis was performed using the standard protocol following bolus administration of intravenous contrast. CONTRAST:  11m ISOVUE-300 IOPAMIDOL (ISOVUE-300) INJECTION 61% COMPARISON:  Multiple exams, including CT right hip from 06/04/2018 and abdominal radiographs from 06/26/2017 FINDINGS: Lower chest: Mild cardiomegaly. Cylindrical bronchiectasis in the right lower lobe. Scarring or mild atelectasis in the right middle lobe and lingula. Hepatobiliary: Slight  nodularity of the hepatic margin suspicious for cirrhosis. Cholecystectomy. Pancreas: There is stranding along the pancreaticoduodenal groove, although I feel  this is more likely to be due to proximal duodenal inflammation than pancreatitis. Spleen: Unremarkable Adrenals/Urinary Tract: 2 mm right kidney lower pole nonobstructive renal calculus on image 33/2. Punctate calcifications anterior to the left psoas muscle for example on image 40/2 are thought to be venous rather than ureteral. 0.8 by 0.6 cm hypodense lesion in the right mid kidney on image 19/7 is statistically likely to be a cyst but technically nonspecific due to small size. Stomach/Bowel: Wall thickening along the descending duodenum with surrounding edema/fluid. There is potentially some wall thickening in the stomach antrum as well. Sigmoid colon diverticulosis.  No active diverticulitis. Twisted loop left upper quadrant small bowel shown on image 35/5, with an internal air-fluid level measuring up to 3.7 cm in diameter. Vascular/Lymphatic: Aortoiliac atherosclerotic vascular disease. Reproductive: Soft tissue prominence of the anterior uterus favoring uterine fibroid. Other: No supplemental non-categorized findings. Musculoskeletal: 6.8 cm in diameter umbilical hernia containing adipose tissue extending to the left of midline. Subacute fractures of the right eighth, ninth, tenth, eleventh, twelfth fractures. Several of these are displaced. There are segmental fractures of the right tenth and eleventh ribs. Higher ribs could be fractured but are not included on imaging. There is an oblique fracture through the body of the T10 vertebral body, with associated sclerosis, only partially included on today's exam. Inferior endplate compression fractures at T12 and L1, probably old. Notable hemangioma eccentric to the right in the L1 vertebra. Degenerative arthropathy of the hips, right greater than left. IMPRESSION: 1. Considerable abnormal wall thickening  in the proximal duodenum and stomach antrum with surrounding edema. Favor inflammation such as in the setting of peptic ulcer disease over groove pancreatitis as a cause. 2. Multiple acute or subacute right lower subacute rib fractures. Some of these are segmental and displaced. Query recent trauma. 3. subacute oblique fracture through the T10 vertebral body. Dedicated imaging of the thoracic spine is probably warranted to further workup this injury and assess for other injuries. 4. There is a twisted loop of small bowel in the left upper quadrant, with an internal air-fluid level. No extraluminal gas or obvious vascular compromise at this time. No compelling findings of obstruction. 5. Other imaging findings of potential clinical significance: Mild cardiomegaly. Cylindrical bronchiectasis in the right lower lobe. Mild bibasilar scarring. Hepatic cirrhosis. Nonobstructive right nephrolithiasis. Sigmoid colon diverticulosis. Aortic Atherosclerosis (ICD10-I70.0). 6.8 cm umbilical hernia containing adipose tissue eccentric to the left. Old compression fractures at T12 and L1. Degenerative hip arthropathy, right greater than left. Electronically Signed   By: Van Clines M.D.   On: 08/05/2018 23:57    Pending Labs Unresulted Labs (From admission, onward)   None      Vitals/Pain Today's Vitals   08/06/18 0100 08/06/18 0130 08/06/18 0200 08/06/18 0230  BP: (!) 164/97 (!) 163/148 (!) 153/83 (!) 141/102  Pulse: (!) 56 (!) 57 (!) 34 (!) 132  Resp: '18 19 20 18  '$ Temp:      TempSrc:      SpO2: 99% 97% 95% 93%  Weight:      Height:      PainSc:        Isolation Precautions Droplet precaution  Medications Medications  hydrochlorothiazide (HYDRODIURIL) tablet 25 mg (has no administration in time range)  pantoprazole (PROTONIX) EC tablet 40 mg (has no administration in time range)  morphine 2 MG/ML injection 2-4 mg (has no administration in time range)  sodium chloride 0.9 % bolus 1,000 mL  (1,000 mLs Intravenous New Bag/Given 08/05/18 2240)  ondansetron [facility]) injection 4 mg (4 mg Intravenous Given 08/05/18 2240)  iopamidol (ISOVUE-300) 61 % injection 100 mL (100 mLs Intravenous Contrast Given 08/05/18 2244)  metoprolol tartrate (LOPRESSOR) tablet 12.5 mg (12.5 mg Oral Given 08/06/18 0115)    Mobility non-ambulatory

## 2018-08-06 NOTE — Care Management Note (Addendum)
Case Management Note  Patient Details  Name: [name] MRN: [id number] Date of Birth: 1934-01-23  Subjective/Objective:                  RNCM met with [name] (HCPOA), Santiago Glad and [employer]. [name] will bring HCPOA for record.  Patient has 24/7 care in the home by family. [facility]. Her PCP is with [facility].  She ambulates with a rollator at baseline. She is more confused and problems with memory. She uses [facility]. Nocturnal O2 at home through [facility].   Action/Plan: Brad with [facility] notified of patient admission.   Expected Discharge Date:  08/09/18               Expected Discharge Plan:     In-House Referral:     Discharge planning Services  CM Consult  Post Acute Care Choice:  Home Health, Resumption of Svcs/PTA Provider Choice offered to:  Adult Children  DME Arranged:    DME Agency:     HH Arranged:    HH Agency:  [facility]  Status of Service:  In process, will continue to follow  If discussed at Long Length of Stay Meetings, dates discussed:    Additional Comments:  Marshell Garfinkel, RN 08/06/2018, 11:18 AM

## 2018-08-06 NOTE — Progress Notes (Signed)
Pt refused CPAP. Stated she hasn't worn in years. RN aware. O2 sats 92% hr 66. Pt on 2lpm nasal cannula.

## 2018-08-06 NOTE — Clinical Social Work Note (Signed)
Clinical Social Work Assessment  Patient Details  Name: PHILLIP SANDLER MRN: [id number] Date of Birth: Oct 31, 1933  Date of referral:  08/06/18               Reason for consult:  Discharge Planning                Permission sought to share information with:  Case Manager Permission granted to share information::  Yes, Verbal Permission Granted  Name::        Agency::     Relationship::     Contact Information:     Housing/Transportation Living arrangements for the past 2 months:  Single Family Home Source of Information:  Patient, Adult Children Patient Interpreter Needed:  None Criminal Activity/Legal Involvement Pertinent to Current Situation/Hospitalization:  No - Comment as needed Significant Relationships:  Adult Children Lives with:  Adult Children Do you feel safe going back to the place where you live?  Yes Need for family participation in patient care:  Yes (Comment)  Care giving concerns:  Patient lives in [place] with her daughter/ HPOA [name] and son Herbie Baltimore.    Social Worker assessment / plan:  Holiday representative (CSW) reviewed chart and noted that patient has a spinal fracture. PT is pending. CSW met with patient and her daughter Santiago Glad was at bedside. Patient was alert and oriented X3 and was laying in the bed. CSW introduced self and explained role of CSW department. Per patient she lives in [place] and her daughter Shauna Hugh is her primary caregiver and provides 24/7 supervision. Per patient her son Herbie Baltimore also lives with her and works part time. Per patient she has [facility] coming out to the house and would like to continue with them. Per patient she does not use a cpap and is on oxygen at night. Per patient she prefers to go home and does not believe she will need to go to a SNF. RN case manager aware of above. CSW will continue to follow and assist as needed.   Employment status:  Retired Nurse, adult PT Recommendations:   Not assessed at this time Information / Referral to community resources:  Other (Comment Required)(Patient will D/C home. )  Patient/Family's Response to care:  Patient prefers to D/C home.   Patient/Family's Understanding of and Emotional Response to Diagnosis, Current Treatment, and Prognosis:  Patient was very pleasant and thanked CSW for assistance.   Emotional Assessment Appearance:  Appears stated age Attitude/Demeanor/Rapport:    Affect (typically observed):  Accepting, Adaptable, Pleasant Orientation:  Oriented to Self, Oriented to Place, Oriented to  Time, Oriented to Situation, Fluctuating Orientation (Suspected and/or reported Sundowners) Alcohol / Substance use:  Not Applicable Psych involvement (Current and /or in the community):  No (Comment)  Discharge Needs  Concerns to be addressed:  Discharge Planning Concerns Readmission within the last 30 days:  No Current discharge risk:  Dependent with Mobility Barriers to Discharge:  Continued Medical Work up   [employer], [name], LCSW 08/06/2018, 2:38 PM

## 2018-08-06 NOTE — NC FL2 (Signed)
[place] LEVEL OF CARE SCREENING TOOL     IDENTIFICATION  [name] Birthdate: 03/18/1934 Sex: female Admission Date (Current Location): 08/05/2018  [place] and [place] Number:  Engineering geologist and Address:  [facility], [address], [place], [place] [address]      Provider Number: [id number]  Attending Physician Name and Address:  Hillary Bow, MD  Relative Name and Phone Number:       Current Level of Care: Hospital Recommended Level of Care: [place] Prior Approval Number:    Date Approved/Denied:   PASRR Number: [id number])  Discharge Plan: SNF    Current Diagnoses: Patient Active Problem List   Diagnosis Date Noted  . Closed T10 spinal fracture ([place]) 08/06/2018  . H/O fracture of right hip 07/14/2018  . Estrogen deficiency 07/09/2018  . Screening mammogram, encounter for 07/09/2018  . Mobility impaired 07/09/2018  . Gastritis 07/06/2018  . Numbness in feet 07/06/2018  . Left knee pain 04/07/2018  . Cor pulmonale (chronic) ([place]) 02/08/2018  . ILD (interstitial lung disease) ([place]) 10/20/2017  . Hypothyroid 07/03/2017  . B12 deficiency 07/03/2017  . Encounter for audiology evaluation 07/03/2017  . Encounter for screening mammogram for breast cancer 07/03/2017  . Tearfulness 06/28/2017  . Dementia ([place]) 06/28/2017  . Poor balance 09/26/2016  . Neck pain 07/30/2016  . Diarrhea 07/30/2016  . Routine general medical examination at a health care facility 06/26/2016  . Generalized anxiety disorder 06/04/2016  . Intertrigo 06/04/2016  . Chronic cough 05/01/2016  . Constipation 01/09/2016  . Umbilical hernia 01/00/7121  . Chronic respiratory failure ([place]) 10/05/2015  . Thromboembolism of vein 10/02/2015  . Infection of prosthetic joint ([place]) 09/18/2015  . Infection of prosthetic left knee joint ([place]) 09/12/2015  . Effusion of knee 08/30/2015  . Pulmonary hypertension ([place])    . Physical deconditioning 02/25/2015  . RBBB-new 02/12/2015  . History of bilat PE after TKR (off Coumadin Dec 2015) 02/12/2015  . Abnormal EKG-TWI, RBBB, prolonged QT   . Hernia, umbilical 97/58/8325  . Hx of colonic polyps 10/24/2014  . Diverticulosis of colon without hemorrhage   . OSA (obstructive sleep apnea) 07/22/2014  . Unspecified arthropathy, lower leg 03/21/2014  . Status post surgery 03/15/2014  . Arthritis of knee 03/09/2014  . Solitary pulmonary nodule 05/03/2013  . History of DVT (deep vein thrombosis) 11/24/2012  . History of artificial joint 10/29/2012  . 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 Complications due to internal joint prosthesis ([place]) 09/07/2012  . PES PLANUS 08/15/2010  . URINARY FREQUENCY, CHRONIC 07/14/2008  . Dyslipidemia 07/09/2007  . Essential hypertension 07/09/2007  . PSVT (paroxysmal supraventricular tachycardia) ([place]) 02/23/2007  . DEGENERATIVE JOINT DISEASE 02/23/2007  . SPINAL STENOSIS, LUMBAR 02/23/2007  . History of osteopenia 02/23/2007  . SPONDYLOLISTHESIS 02/23/2007    Orientation RESPIRATION BLADDER Height & Weight     Self, Time, Situation, Place  O2(2 Liters oxygen. ) Incontinent Weight: 204 lb (92.5 kg) Height:  5\' 6"  (167.6 cm)  BEHAVIORAL SYMPTOMS/MOOD NEUROLOGICAL BOWEL NUTRITION STATUS      Continent Diet(Diet: Heart Healthy )  AMBULATORY STATUS COMMUNICATION OF NEEDS Skin   Extensive Assist Verbally PU Stage and Appropriate Care( Left anterior knee with full thickness wound; 2X1X.2cm, red and moist, no odor, drainage, or fluctunace. Left posterior leg with healing full thickness wound; .2X.2X.2cm, located in a deep valley, red and moist.)  Personal Care Assistance Level of Assistance  Bathing, Feeding, Dressing Bathing Assistance: Limited assistance Feeding assistance: Independent Dressing Assistance: Limited assistance     Functional Limitations Info  Sight, Hearing, Speech Sight Info:  Adequate Hearing Info: Adequate Speech Info: Adequate    SPECIAL CARE FACTORS FREQUENCY  PT (By licensed PT), OT (By licensed OT)     PT Frequency: (5) OT Frequency: (5)            Contractures      Additional Factors Info  Code Status, Allergies Code Status Info: (DNR ) Allergies Info: (Prednisone, Zithromax Azithromycin, Penicillins, Sulfonamide Derivatives, Adhesive Tape)           Current Medications (08/06/2018):  This is the current hospital active medication list Current Facility-Administered Medications  Medication Dose Route Frequency Provider Last Rate Last Dose  . acetaminophen (TYLENOL) tablet 650 mg  650 mg Oral Q6H PRN Arta Silence, MD       Or  . acetaminophen (TYLENOL) suppository 650 mg  650 mg Rectal Q6H PRN Arta Silence, MD      . ALPRAZolam Duanne Moron) tablet 0.5 mg  0.5 mg Oral BID PRN Arta Silence, MD      . bisacodyl (DULCOLAX) EC tablet 5 mg  5 mg Oral Daily PRN Arta Silence, MD      . calcium-vitamin D (OSCAL WITH D) 500-200 MG-UNIT per tablet 1 tablet  1 tablet Oral BID Arta Silence, MD   1 tablet at 08/06/18 1131  . cephALEXin (KEFLEX) capsule 500 mg  500 mg Oral TID Arta Silence, MD   500 mg at 08/06/18 1131  . cholecalciferol (VITAMIN D) tablet 1,000 Units  1,000 Units Oral Daily Arta Silence, MD   1,000 Units at 08/06/18 1131  . enoxaparin (LOVENOX) injection 40 mg  40 mg Subcutaneous Q24H Arta Silence, MD   40 mg at 08/06/18 1131  . famotidine (PEPCID) tablet 20 mg  20 mg Oral BID Arta Silence, MD   20 mg at 08/06/18 1131  . FLUoxetine (PROZAC) capsule 40 mg  40 mg Oral Daily Arta Silence, MD   40 mg at 08/06/18 1405  . hydrochlorothiazide (HYDRODIURIL) tablet 25 mg  25 mg Oral Daily Arta Silence, MD   25 mg at 08/06/18 1131  . lactated ringers infusion   Intravenous Continuous Arta Silence, MD      . levothyroxine (SYNTHROID, LEVOTHROID) tablet 50 mcg  50 mcg Oral  QAC breakfast Arta Silence, MD   50 mcg at 08/06/18 0815  . metoprolol tartrate (LOPRESSOR) tablet 12.5 mg  12.5 mg Oral BID Arta Silence, MD   12.5 mg at 08/06/18 1132  . morphine 2 MG/ML injection 2-4 mg  2-4 mg Intravenous Q3H PRN Arta Silence, MD      . ondansetron (ZOFRAN) tablet 4 mg  4 mg Oral Q6H PRN Arta Silence, MD       Or  . ondansetron (ZOFRAN) injection 4 mg  4 mg Intravenous Q6H PRN Jodell Cipro, Prasanna, MD      . pantoprazole (PROTONIX) EC tablet 40 mg  40 mg Oral BID AC Sudini, Srikar, MD      . senna-docusate (Senokot-S) tablet 1 tablet  1 tablet Oral QHS PRN Arta Silence, MD         Discharge Medications: Please see discharge summary for a list of discharge medications.  Relevant Imaging Results:  Relevant Lab Results:   Additional Information (SSN: [id number])  Deeana Atwater, [name], LCSW

## 2018-08-06 NOTE — Progress Notes (Signed)
Advance care planning  Purpose of Encounter T10 compression fracture, gastritis, CODE STATUS discussion  Parties in Attendance Patient and daughter at bedside  Patients Decisional capacity Patient with early dementia.  Has a documented healthcare power of attorney her daughter  Discussed with daughter at bedside.  Explained T10 compression fracture, need for MRI and further treatment and plan regarding gastritis.  All questions answered.  We discussed regarding CODE STATUS.  Patient is DO NOT RESUSCITATE and DO NOT INTUBATE per daughter.    CODE STATUS changed.  Time spent - 17 min

## 2018-08-06 NOTE — Progress Notes (Signed)
  Same day note  * T10 vertebral fracture.  Nurse neurosurgery consulted.  Pain control.  TLSO brace.  Physical therapy consult. * Gastritis/duodenitis on CT scan.  Start Protonix.  Zofran PRN. * Rib fractures- stable * Dementia  Pain control PT eval Neurosurgery D/c 1-2 days

## 2018-08-06 NOTE — Progress Notes (Signed)
PT Cancellation Note  Patient Details Name: [name] MRN: [id number] DOB: 1934/05/27   Cancelled Treatment:    Reason Eval/Treat Not Completed: Other (comment).  PT consult received.  Chart reviewed.  Per chart, neurosurgery recommendations for MRI T-spine and TLSO brace.  Stopped in pt's room this morning and pt did not have brace yet; nurse notified.  Will hold PT at this time until pt has TLSO and is appropriate for OOB mobility (nurse notified).  Leitha Bleak, PT 08/06/18, 10:59 AM [phone]

## 2018-08-06 NOTE — Consult Note (Signed)
[facility] Nurse wound consult note Reason for Consult: Consult requested for left knee and leg.  Pt states she is followed by the outpatient wound care center and they have been using  "a blue foam dressing."  Informed patient and family member at the bedside that we do not carry this topical treatment in the [place]. Wound type: Left anterior knee with full thickness wound; 2X1X.2cm, red and moist, no odor, drainage, or fluctunace.   Left posterior leg with healing full thickness wound; .2X.2X.2cm, located in a deep valley, red and moist Dressing procedure/placement/frequency: Aquacel to absorb drainage and provide antimicrobial benefits.  Pt can resume use of Hydroferra blue and  follow-up with the outpatient wound care center after discharge. Please re-consult if further assistance is needed.  Thank-you,  Julien Girt MSN, [place], [place], [place], [place]

## 2018-08-06 NOTE — ED Notes (Signed)
Hospitalist to bedside. Pt taken off the bedpan and new diaper applied. Pt has a draining open wound to the posterior left leg that she is taking antibiotics for.

## 2018-08-06 NOTE — Consult Note (Signed)
Referring Physician:  No referring provider defined for this encounter.  Primary Physician:  Tower, Wynelle Fanny, MD  Chief Complaint: T10 compression fracture  History of Present Illness: [name] is a 84 y.o. female who presents as a consult for T10 compression fracture.  Patient presented to emergency room for evaluation of nausea and vomiting but CT of the abdomen was performed that revealed a T10 compression fracture.  Further investigation patient admits to a fall 5 days ago.  Denies any lower extremity symptoms such as pain/numbness/tingling/weakness, saddle paresthesia, bladder/bowel dysfunction, wraparound radiating pain at the level of compression fracture.   Pain currently rated 2/10  Review of Systems:  A 10 point review of systems is negative, except for the pertinent positives and negatives detailed in the HPI.  Past Medical History: Past Medical History:  Diagnosis Date  . Anemia   . Arthritis    knee  . Diverticulosis   . Hx of colonic polyps 10/24/2014  . Hyperlipidemia   . Hypertension   . Osteopenia   . Overactive bladder    urge incontinence  . PSVT (paroxysmal supraventricular tachycardia) ([place])   . Pulmonary emboli ([place])   . Pulmonary hypertension ([place])   . Right ventricular failure ([place])   . Sleep apnea    CPAP  . Spinal stenosis     Past Surgical History: Past Surgical History:  Procedure Laterality Date  . BLADDER SUSPENSION  1993  . CARDIAC CATHETERIZATION N/A 02/28/2015   Procedure: Right/Left Heart Cath and Coronary Angiography;  Surgeon: [place] Sine, MD;  Location: [facility] INVASIVE CV LAB CUPID;  Service: Cardiovascular;  Laterality: N/A;  . CATARACT EXTRACTION Bilateral 2008  . CHOLECYSTECTOMY    . COLONOSCOPY    . COLONOSCOPY N/A 10/24/2014   Procedure: COLONOSCOPY;  Surgeon: Gatha Mayer, MD;  Location: [facility] ENDOSCOPY;  Service: Endoscopy;  Laterality: N/A;  . CYST REMOVAL LEG Left 02/15/2016   cyst removal from back of left knee  .  ELECTROPHYSIOLOGIC STUDY N/A 03/02/2015   Procedure: SVT Ablation;  Surgeon: Evans Lance, MD;  Location: [facility] CV LAB;  Service: Cardiovascular;  Laterality: N/A;  . INCISIONAL HERNIA REPAIR    . RIGHT HEART CATHETERIZATION N/A 07/04/2014   Procedure: RIGHT HEART CATH;  Surgeon: Jolaine Artist, MD;  Location: [facility] CATH LAB;  Service: Cardiovascular;  Laterality: N/A;  . TONSILLECTOMY AND ADENOIDECTOMY    . TOTAL KNEE ARTHROPLASTY Left 2000  . TOTAL KNEE ARTHROPLASTY Left 10/29/2012   at [facility]  . TOTAL KNEE ARTHROPLASTY Right 03/09/2014  . TOTAL KNEE REVISION  01/12/2012   Procedure: TOTAL KNEE REVISION;  Surgeon: Kerin Salen, MD;  Location: [place];  Service: Orthopedics;  Laterality: Left;  DEPUY/ LCS , HAND SET  . TRIGGER FINGER RELEASE  01/12/2012   Procedure: RELEASE TRIGGER FINGER/A-1 PULLEY;  Surgeon: Kerin Salen, MD;  Location: [facility];  Service: Orthopedics;  Laterality: Right;    Allergies: Allergies as of 08/05/2018 - Review Complete 08/05/2018  Allergen Reaction Noted  . Prednisone Other (See Comments) 02/23/2014  . Zithromax [azithromycin] Other (See Comments) 02/19/2015  . Penicillins Hives and Swelling 02/23/2007  . Sulfonamide derivatives Hives 02/23/2007  . Adhesive [tape] Other (See Comments) and Rash 09/29/2013    Medications:  Current Facility-Administered Medications:  .  acetaminophen (TYLENOL) tablet 650 mg, 650 mg, Oral, Q6H PRN **OR** acetaminophen (TYLENOL) suppository 650 mg, 650 mg, Rectal, Q6H PRN, Jodell Cipro, Prasanna, MD .  ALPRAZolam Duanne Moron) tablet 0.5 mg, 0.5 mg, Oral,  BID PRN, Arta Silence, MD .  bisacodyl (DULCOLAX) EC tablet 5 mg, 5 mg, Oral, Daily PRN, Jodell Cipro, Prasanna, MD .  calcium-vitamin D (OSCAL WITH D) 500-200 MG-UNIT per tablet 1 tablet, 1 tablet, Oral, BID, Arta Silence, MD, 1 tablet at 08/06/18 1131 .  cephALEXin (KEFLEX) capsule 500 mg, 500 mg, Oral, TID, Jodell Cipro, Prasanna, MD, 500 mg at 08/06/18 1131 .   cholecalciferol (VITAMIN D) tablet 1,000 Units, 1,000 Units, Oral, Daily, Arta Silence, MD, 1,000 Units at 08/06/18 1131 .  enoxaparin (LOVENOX) injection 40 mg, 40 mg, Subcutaneous, Q24H, Sridharan, Prasanna, MD, 40 mg at 08/06/18 1131 .  famotidine (PEPCID) tablet 20 mg, 20 mg, Oral, BID, Jodell Cipro, Prasanna, MD, 20 mg at 08/06/18 1131 .  FLUoxetine (PROZAC) capsule 40 mg, 40 mg, Oral, Daily, Jodell Cipro, Prasanna, MD, 40 mg at 08/06/18 1405 .  hydrochlorothiazide (HYDRODIURIL) tablet 25 mg, 25 mg, Oral, Daily, Jodell Cipro, Prasanna, MD, 25 mg at 08/06/18 1131 .  levothyroxine (SYNTHROID, LEVOTHROID) tablet 50 mcg, 50 mcg, Oral, QAC breakfast, Arta Silence, MD, 50 mcg at 08/06/18 0815 .  metoprolol tartrate (LOPRESSOR) tablet 12.5 mg, 12.5 mg, Oral, BID, Jodell Cipro, Prasanna, MD, 12.5 mg at 08/06/18 1132 .  morphine 2 MG/ML injection 2-4 mg, 2-4 mg, Intravenous, Q3H PRN, Jodell Cipro, Prasanna, MD .  ondansetron (ZOFRAN) tablet 4 mg, 4 mg, Oral, Q6H PRN **OR** ondansetron (ZOFRAN) injection 4 mg, 4 mg, Intravenous, Q6H PRN, Jodell Cipro, Prasanna, MD .  pantoprazole (PROTONIX) EC tablet 40 mg, 40 mg, Oral, BID AC, Sudini, Srikar, MD .  senna-docusate (Senokot-S) tablet 1 tablet, 1 tablet, Oral, QHS PRN, Arta Silence, MD   Social History: Social History   Tobacco Use  . Smoking status: Never Smoker  . Smokeless tobacco: Never Used  Substance Use Topics  . Alcohol use: No    Alcohol/week: 0.0 standard drinks  . Drug use: No    Family Medical History: Family History  Problem Relation Age of Onset  . Breast cancer Mother 28  . Heart disease Father   . Diabetes Father   . Hypertension Father   . Uterine cancer Sister     Physical Examination: Vitals:   08/06/18 0430 08/06/18 0748  BP: (!) 146/68 (!) 143/87  Pulse: (!) 104 76  Resp:    Temp: 98.6 F (37 C) 97.6 F (36.4 C)  SpO2: 94% 96%     General: Patient is well developed, well nourished, calm, collected, and  in no apparent distress.  TLSO brace present, sitting in bed eating lunch.  Psychiatric: Patient is non-anxious.  Head:  Pupils equal, round, and reactive to light.  ENT:  Oral mucosa appears well hydrated.  Neck:   Supple.  Full range of motion.  Respiratory: Patient is breathing without any difficulty.  Extremities: No edema.  Skin:   On exposed skin, there are no abnormal skin lesions.  NEUROLOGICAL:  General: In no acute distress.   Awake, alert, oriented to person, place, and time.  Pupils equal round and reactive to light.  Facial tone is symmetric.    ROM of spine: Not assessed brace present  Strength:  Side Iliopsoas Quads Hamstring PF DF EHL  R 5 5 5 5 5 5   L 5 5 5 5 5 5   Sensation: Intact and symmetric throughout the lower extremities.  Imaging: EXAM: CT THORACIC SPINE WITHOUT CONTRAST  TECHNIQUE: Multidetector CT images of the thoracic were obtained using the standard protocol without intravenous contrast.  COMPARISON:  Chest CT 02/22/2015  FINDINGS: Alignment: Normal alignment  of the thoracic spine.  Vertebrae: There is diffuse idiopathic skeletal hyperostosis with flowing osteophytes leading to fusion of the thoracic spine from T4-T12. There is an acute fracture through the midportion of the T10 vertebral body that extends to the right posterior aspect of the body but does not breach the spinal canal. There is no extension into the posterior elements and no osseous tension band disruption.  Paraspinal and other soft tissues: Small prevertebral hematoma at the T10 level. There is cardiomegaly with calcific aortic atherosclerosis.  Disc levels: No traumatic disc herniation is evident. Large posterior osteophyte at T12-L1 causes mild-to-moderate spinal canal narrowing.No other bony spinal canal stenosis.  IMPRESSION: 1. Acute fracture of the rigid spine in the context of diffuse idiopathic skeletal hyperostosis. Mildly distracted fracture  through the midportion of the T10 vertebral body extends to the posterior right wall. No posterior tension band disruption or extension to the posterior elements. Thoracic spine MRI is recommended. 2. Cardiomegaly and calcific aortic atherosclerosis (ICD10-I70.0).  These results were called by telephone at the time of interpretation on 08/06/2018 at 1:40 am to Dr. Beather Arbour , who verbally acknowledged these results.  EXAM: MRI THORACIC SPINE WITHOUT CONTRAST  TECHNIQUE: Multiplanar, multisequence MR imaging of the thoracic spine was performed. No intravenous contrast was administered.  COMPARISON:  Thoracic spine CT 08/06/2018  FINDINGS: Alignment: Exaggerated thoracic kyphosis. No traumatic retrolisthesis. There is mild degenerative retrolisthesis at L1-2  Vertebrae: Horizontal hypointense fracture plane with mild adjacent marrow edema in the T10 body. The bone is sclerotic by CT and the fracture margins are also indistinct by CT. No retropulsion is seen. No evident underlying aggressive bone lesion. No additional fracture is noted.  Cord:  Normal signal and morphology.  Paraspinal and other soft tissues: No paravertebral edema is noted.  Disc levels:  Diffuse flowing osteophytes with multilevel thoracic ankylosis seen from T5-T11 by CT. There is advanced disc degeneration at T11-12, T12-L1, and L1-2. At T12-L1 there is a central disc protrusion with buttressing osteophytes, although noncompressive. Foraminal narrowing at T12-L1 and L1-2.  IMPRESSION: 1. T10 vertebral body fracture with subacute appearance. No retropulsion or impingement. 2. Diffuse idiopathic skeletal hyperostosis with multilevel thoracic ankylosis. Assessment and Plan: Ms. [name] is a pleasant 84 y.o. female with T10 compression fracture without retropulsion or any indication of nerve impingement.  Neuro exam intact.  Surgical intervention not indicated at this time.  Recommendations to maintain  TLSO brace when upright at greater than 30 degree angle.  She may follow-up in approximately 1 month for repeat x-rays to determine fracture stability.  Marin Olp, PA-C Dept. of Neurosurgery

## 2018-08-06 NOTE — Clinical Social Work Placement (Signed)
   CLINICAL SOCIAL WORK PLACEMENT  NOTE  Date:  08/06/2018  Patient Details  Name: [name] MRN: [id number] Date of Birth: 1934-02-10  Clinical Social Work is seeking post-discharge placement for this patient at the [place] level of care (*CSW will initial, date and re-position this form in  chart as items are completed):  Yes   Patient/family provided with [place] Work Department's list of facilities offering this level of care within the geographic area requested by the patient (or if unable, by the patient's family).  Yes   Patient/family informed of their freedom to choose among providers that offer the needed level of care, that participate in Medicare, Medicaid or managed care program needed by the patient, have an available bed and are willing to accept the patient.  Yes   Patient/family informed of [facility]'s ownership interest in [facility] and [facility], as well as of the fact that they are under no obligation to receive care at these facilities.  PASRR submitted to EDS on       PASRR number received on       Existing PASRR number confirmed on 08/06/18     FL2 transmitted to all facilities in geographic area requested by pt/family on 08/06/18     FL2 transmitted to all facilities within larger geographic area on       Patient informed that his/her managed care company has contracts with or will negotiate with certain facilities, including the following:        Yes   Patient/family informed of bed offers received.  Patient chooses bed at [facility] )     Physician recommends and patient chooses bed at      Patient to be transferred to   on  .  Patient to be transferred to facility by       Patient family notified on   of transfer.  Name of family member notified:        PHYSICIAN       Additional Comment:    _______________________________________________ [name], [name], LCSW 08/06/2018, 4:59  PM

## 2018-08-06 NOTE — H&P (Signed)
[place] [facility] [place] NAME: [name]    MR#:  [id number]  DATE OF BIRTH:  1933-12-05  DATE OF ADMISSION:  08/05/2018  PRIMARY CARE PHYSICIAN: Tower, Wynelle Fanny, MD   REQUESTING/REFERRING PHYSICIAN: Darel Hong, MD  CHIEF COMPLAINT:   Chief Complaint  Patient presents with  . Emesis    HISTORY OF PRESENT ILLNESS:  [name]  is a 84 y.o. female with a known history of HTN, hypothyroidism, GERD, as well as documented Hx of Afib (no AC), and OSA, now p/w reported fever, AP/N/V. Pt does not carry a diagnosis of dementia, but is AAOx1. She states she is at [facility] in [place], and the year is "two thousand something". She says the POTUS is "Mr. [place]", but she cannot provide the name. Her daughter tells me her memory has been getting worse x64mo, but I suspect this has likely been going on for longer. They are to see Neurology soon.  69 of Hx obtained from daughter. Pt reportedly febrile during the afternoon on 08/05/2018. Had N/V x3 @~1400PM. Felt poorly for remainder of day, fatigue/malaise, generalized weakness, body aches/myalgias. CT A/P (-) obstruction, (+) gastritis/duodenitis (i.e. PUD).  Pt reportedly had a mechanical fall on Sunday 11/06. Pt was outside, attempting to transfer from car to wheelchair, fell on knees and then rolled onto back, (-) head injury. Scraped knees, but no significant injury. Pt helped into wheelchair, then went to church. No further c/o pain for the rest of the day.  Pt admitted for incidental finding of R rib Fx, T10 Fx, seen on CT A/P. CT T-spine (+) "Acute fracture of the rigid spine in the context of diffuse idiopathic skeletal hyperostosis. Mildly distracted fracture through the midportion of the T10 vertebral body extends to the posterior right wall. No posterior tension band disruption or extension to the posterior elements." Neurosurgery contacted by ED provider (Dr. Mable Paris),  case d/w Dr. Cari Caraway, recommended TLSO brace + MRI T-spine. Pt is comfortable and in no acute distress at the time of my assessment. Fx 2/2 falls. Pt's daughter tells me she has fallen 3x in the past yr, but does not have Hx of falls prior to that, and does not fall frequently. Pt uses wheelchair most of the time, but uses a walker for transfers and to get through doors.  Pt's daughter states pt w/ Hx B/L knee replacement, and uses 2L [place] O2 qHS + PRN.  PAST MEDICAL HISTORY:   Past Medical History:  Diagnosis Date  . Anemia   . Arthritis    knee  . Diverticulosis   . Hx of colonic polyps 10/24/2014  . Hyperlipidemia   . Hypertension   . Osteopenia   . Overactive bladder    urge incontinence  . PSVT (paroxysmal supraventricular tachycardia) ([place])   . Pulmonary emboli ([place])   . Pulmonary hypertension ([place])   . Right ventricular failure ([place])   . Sleep apnea    CPAP  . Spinal stenosis     PAST SURGICAL HISTORY:   Past Surgical History:  Procedure Laterality Date  . BLADDER SUSPENSION  1993  . CARDIAC CATHETERIZATION N/A 02/28/2015   Procedure: Right/Left Heart Cath and Coronary Angiography;  Surgeon: Troy Sine, MD;  Location: [facility]AS[facility]UP[facility];  Service: Cardiovascular;  Laterality: N/A;  . CATARACT EXTRACTION Bilateral 2008  . CHOLECYSTECTOMY    . COLONOSCOPY    . COLONOSCOPY N/A 10/24/2014   Procedure: COLONOSCOPY;  Surgeon: Gatha Mayer, MD;  Location: [facility];  Service: Endoscopy;  Laterality: N/A;  . CYST REMOVAL LEG Left 02/15/2016   cyst removal from back of left knee  . ELECTROPHYSIOLOGIC STUDY N/A 03/02/2015   Procedure: SVT Ablation;  Surgeon: Evans Lance, MD;  Location: [facility] CV LAB;  Service: Cardiovascular;  Laterality: N/A;  . INCISIONAL HERNIA REPAIR    . RIGHT HEART CATHETERIZATION N/A 07/04/2014   Procedure: RIGHT HEART CATH;  Surgeon: Jolaine Artist, MD;  Location: [facility] CATH LAB;  Service: Cardiovascular;  Laterality: N/A;  .  TONSILLECTOMY AND ADENOIDECTOMY    . TOTAL KNEE ARTHROPLASTY Left 2000  . TOTAL KNEE ARTHROPLASTY Left 10/29/2012   at [facility]  . TOTAL KNEE ARTHROPLASTY Right 03/09/2014  . TOTAL KNEE REVISION  01/12/2012   Procedure: TOTAL KNEE REVISION;  Surgeon: Kerin Salen, MD;  Location: [facility];  Service: Orthopedics;  Laterality: Left;  DEPUY/ LCS , HAND SET  . TRIGGER FINGER RELEASE  01/12/2012   Procedure: RELEASE TRIGGER FINGER/A-1 PULLEY;  Surgeon: Kerin Salen, MD;  Location: [facility];  Service: Orthopedics;  Laterality: Right;    SOCIAL HISTORY:   Social History   Tobacco Use  . Smoking status: Never Smoker  . Smokeless tobacco: Never Used  Substance Use Topics  . Alcohol use: No    Alcohol/week: 0.0 standard drinks    FAMILY HISTORY:   Family History  Problem Relation Age of Onset  . Breast cancer Mother 44  . Heart disease Father   . Diabetes Father   . Hypertension Father   . Uterine cancer Sister     DRUG ALLERGIES:   Allergies  Allergen Reactions  . Prednisone Other (See Comments)    hyperctivity  . Zithromax [Azithromycin] Other (See Comments)    Prolonged QT on EKG  . Penicillins Hives and Swelling    Questionable allergy (?) Currently on cefazolin, tolerating well since 4/21 Patient is tolerating dicloxacillin without issue. No skin changes or other concerns.   . Sulfonamide Derivatives Hives  . Adhesive [Tape] Other (See Comments) and Rash    redness    REVIEW OF SYSTEMS:   Review of Systems  Constitutional: Positive for fever and malaise/fatigue. Negative for chills, diaphoresis and weight loss.  HENT: Negative for congestion, ear pain, hearing loss, nosebleeds, sinus pain, sore throat and tinnitus.   Eyes: Negative for blurred vision, double vision and photophobia.  Respiratory: Negative for cough, hemoptysis, sputum production, shortness of breath and wheezing.   Cardiovascular: Negative for chest pain, palpitations, orthopnea, claudication, leg swelling and  PND.  Gastrointestinal: Positive for abdominal pain, nausea and vomiting. Negative for blood in stool, constipation, diarrhea, heartburn and melena.  Genitourinary: Negative for dysuria, frequency, hematuria and urgency.  Musculoskeletal: Positive for falls and myalgias. Negative for back pain, joint pain and neck pain.  Skin: Negative for itching and rash.  Neurological: Positive for weakness. Negative for dizziness, tingling, tremors, sensory change, speech change, focal weakness, seizures, loss of consciousness and headaches.  Psychiatric/Behavioral: Positive for memory loss. The patient does not have insomnia.    MEDICATIONS AT HOME:   Prior to Admission medications   Medication Sig Start Date End Date Taking? Authorizing Provider  ALPRAZolam (NIRAVAM) 0.5 MG dissolvable tablet Take 0.5 mg by mouth 2 (two) times daily as needed for anxiety.   Yes [provider]  benzonatate (TESSALON) 200 MG capsule Take 1 capsule (200 mg total) by mouth 2 (two) times daily as needed for cough.  11/24/17  Yes Rigoberto Noel, MD  Calcium Carb-Cholecalciferol (CALCIUM 600+D3) 600-200 MG-UNIT TABS Take 1 tablet by mouth daily.   Yes [provider]  cephALEXin (KEFLEX) 500 MG capsule Take 500 mg by mouth 3 (three) times daily.  06/12/17  Yes [provider]  Cholecalciferol (VITAMIN D-1000 MAX ST) 1000 UNITS tablet Take 1,000 Units by mouth daily. 09/08/15  Yes [provider]  Cobalamine Combinations (VITAMIN B12-FOLIC ACID) 161-096 MCG TABS Take 1 tablet by mouth daily.   Yes [provider]  famotidine (PEPCID) 20 MG tablet Take 20 mg by mouth daily.   Yes [provider]  FLUoxetine (PROZAC) 40 MG capsule Take 1 capsule (40 mg total) by mouth daily. 04/07/18  Yes Tower, Wynelle Fanny, MD  hydrochlorothiazide (HYDRODIURIL) 25 MG tablet TAKE 1 TABLET DAILY 07/05/18  Yes Tower, Wynelle Fanny, MD  levothyroxine (SYNTHROID, LEVOTHROID) 50 MCG tablet Take 1 tablet (50 mcg  total) by mouth daily. 07/09/18  Yes Tower, Wynelle Fanny, MD  Melatonin 5 MG TABS Take 1 tablet by mouth at bedtime.   Yes [provider]  metoprolol tartrate (LOPRESSOR) 25 MG tablet Take 0.5 tablets (12.5 mg total) by mouth 2 (two) times daily. 07/02/18  Yes Allred, Jeneen Rinks, MD  Multiple Vitamins-Minerals (PRESERVISION AREDS) CAPS Take 2 capsules by mouth daily.   Yes [provider]  naproxen sodium (ANAPROX) 220 MG tablet Take 220 mg by mouth daily as needed (pain).    Yes [provider]  Polyethyl Glycol-Propyl Glycol (SYSTANE) 0.4-0.3 % SOLN Apply 1 drop to eye 2 (two) times daily.   Yes [provider]  vitamin C (ASCORBIC ACID) 250 MG tablet Take 250 mg by mouth daily.   Yes [provider]  ALPRAZolam (XANAX) 0.5 MG tablet Take 1 tablet (0.5 mg total) by mouth 2 (two) times daily as needed for anxiety. Caution of sedation Patient not taking: Reported on 08/06/2018 07/06/18   Tower, Wynelle Fanny, MD  Calcium Carb-Cholecalciferol (CALCIUM 600/VITAMIN D3) 600-800 MG-UNIT TABS Take 1 tablet by mouth 2 (two) times daily.     [provider]  famotidine (PEPCID) 20 MG tablet Take 1 tablet (20 mg total) by mouth 2 (two) times daily. 08/06/18 08/06/19  Rudene Re, MD  ondansetron (ZOFRAN ODT) 4 MG disintegrating tablet Take 1 tablet (4 mg total) by mouth every 8 (eight) hours as needed for nausea or vomiting. 08/06/18   Rudene Re, MD  OXYGEN Inhale 2 L into the lungs at bedtime.    [provider]  ranitidine (ZANTAC) 150 MG tablet Take 1 tablet (150 mg total) by mouth 2 (two) times daily. Patient not taking: Reported on 08/06/2018 07/22/18   Tower, Wynelle Fanny, MD      VITAL SIGNS:  Blood pressure (!) 141/102, pulse (!) 59, temperature 98.4 F (36.9 C), temperature source Oral, resp. rate (!) 23, height 5\' 6"  (1.676 m), weight 92.5 kg, SpO2 96 %.  PHYSICAL EXAMINATION:  Physical Exam  Constitutional: She appears well-developed and  well-nourished. She is active and cooperative.  Non-toxic appearance. She does not have a sickly appearance. She does not appear ill. No distress. She is not intubated.  HENT:  Head: Normocephalic and atraumatic.  Mouth/Throat: Oropharynx is clear and moist. No oropharyngeal exudate.  Eyes: Conjunctivae, EOM and lids are normal. No scleral icterus.  Neck: Neck supple. No JVD present. No thyromegaly present.  Cardiovascular: Normal rate, regular rhythm, S1 normal, S2 normal and normal heart sounds.  No extrasystoles are present.  Exam reveals no gallop, no S3, no S4, no distant heart sounds and no friction rub.  No murmur heard. Pulmonary/Chest: Effort normal. No accessory muscle usage or stridor. No apnea, no tachypnea and no bradypnea. She is not intubated. No respiratory distress. She has decreased breath sounds in the right upper field, the right middle field, the right lower field, the left upper field, the left middle field and the left lower field. She has no wheezes. She has no rhonchi. She has no rales.  Abdominal: Soft. Bowel sounds are normal. She exhibits no distension. There is no tenderness. There is no rebound and no guarding.  Musculoskeletal: Normal range of motion. She exhibits no edema or tenderness.  Lymphadenopathy:    She has no cervical adenopathy.  Neurological: She is alert.  AAOx1 (as per HPI).  Skin: Skin is warm and dry. No rash noted. She is not diaphoretic. No erythema.  (+) knee abrasion.  Psychiatric: She has a normal mood and affect. Her speech is normal and behavior is normal. Judgment and thought content normal. Cognition and memory are impaired. She exhibits abnormal recent memory.   HR + RR WNL @ time of exam. LABORATORY PANEL:   CBC Recent Labs  Lab 08/05/18 1704  WBC 7.3  HGB 14.8  HCT 45.0  PLT 245   ------------------------------------------------------------------------------------------------------------------  Chemistries  Recent Labs  Lab  08/05/18 1704  NA 136  K 3.6  CL 99  CO2 28  GLUCOSE 111*  BUN 19  CREATININE 0.79  CALCIUM 9.3  AST 22  ALT 13  ALKPHOS 61  BILITOT 1.0   ------------------------------------------------------------------------------------------------------------------  Cardiac Enzymes No results for input(s): TROPONINI in the last 168 hours. ------------------------------------------------------------------------------------------------------------------  RADIOLOGY:  Dg Chest 2 View  Result Date: 08/05/2018 CLINICAL DATA:  Initial evaluation for acute nausea, vomiting, body aches. EXAM: CHEST - 2 VIEW COMPARISON:  Prior radiograph from 06/26/2018. FINDINGS: Mild cardiomegaly, stable. Mediastinal silhouette within normal limits. Lungs normally inflated. Mild perihilar vascular congestion without overt pulmonary edema. No pleural effusion. No focal infiltrates. No pneumothorax. No acute osseous abnormality.  Osteopenia noted. IMPRESSION: 1. Mild cardiomegaly with pulmonary venous congestion without overt pulmonary edema. 2. No other active cardiopulmonary disease. Electronically Signed   By: Jeannine Boga M.D.   On: 08/05/2018 23:05   Ct Thoracic Spine Wo Contrast  Result Date: 08/06/2018 CLINICAL DATA:  Back pain and minor trauma EXAM: CT THORACIC SPINE WITHOUT CONTRAST TECHNIQUE: Multidetector CT images of the thoracic were obtained using the standard protocol without intravenous contrast. COMPARISON:  Chest CT 02/22/2015 FINDINGS: Alignment: Normal alignment of the thoracic spine. Vertebrae: There is diffuse idiopathic skeletal hyperostosis with flowing osteophytes leading to fusion of the thoracic spine from T4-T12. There is an acute fracture through the midportion of the T10 vertebral body that extends to the right posterior aspect of the body but does not breach the spinal canal. There is no extension into the posterior elements and no osseous tension band disruption. Paraspinal and other  soft tissues: Small prevertebral hematoma at the T10 level. There is cardiomegaly with calcific aortic atherosclerosis. Disc levels: No traumatic disc herniation is evident. Large posterior osteophyte at T12-L1 causes mild-to-moderate spinal canal narrowing.No other bony spinal canal stenosis. IMPRESSION: 1. Acute fracture of the rigid spine in the context of diffuse idiopathic skeletal hyperostosis. Mildly distracted fracture through the midportion of the T10 vertebral body extends to the posterior right wall. No posterior tension band disruption or extension to the posterior elements. Thoracic spine MRI is recommended.  2. Cardiomegaly and calcific aortic atherosclerosis (ICD10-I70.0). These results were called by telephone at the time of interpretation on 08/06/2018 at 1:40 am to Dr. Beather Arbour , who verbally acknowledged these results. Electronically Signed   By: Ulyses Jarred M.D.   On: 08/06/2018 01:41   Ct Abdomen Pelvis W Contrast  Result Date: 08/05/2018 CLINICAL DATA:  Generalized abdominal pain with nausea and vomiting and body aches since last night. EXAM: CT ABDOMEN AND PELVIS WITH CONTRAST TECHNIQUE: Multidetector CT imaging of the abdomen and pelvis was performed using the standard protocol following bolus administration of intravenous contrast. CONTRAST:  131mL ISOVUE-300 IOPAMIDOL (ISOVUE-300) INJECTION 61% COMPARISON:  Multiple exams, including CT right hip from 06/04/2018 and abdominal radiographs from 06/26/2017 FINDINGS: Lower chest: Mild cardiomegaly. Cylindrical bronchiectasis in the right lower lobe. Scarring or mild atelectasis in the right middle lobe and lingula. Hepatobiliary: Slight nodularity of the hepatic margin suspicious for cirrhosis. Cholecystectomy. Pancreas: There is stranding along the pancreaticoduodenal groove, although I feel this is more likely to be due to proximal duodenal inflammation than pancreatitis. Spleen: Unremarkable Adrenals/Urinary Tract: 2 mm right kidney lower  pole nonobstructive renal calculus on image 33/2. Punctate calcifications anterior to the left psoas muscle for example on image 40/2 are thought to be venous rather than ureteral. 0.8 by 0.6 cm hypodense lesion in the right mid kidney on image 19/7 is statistically likely to be a cyst but technically nonspecific due to small size. Stomach/Bowel: Wall thickening along the descending duodenum with surrounding edema/fluid. There is potentially some wall thickening in the stomach antrum as well. Sigmoid colon diverticulosis.  No active diverticulitis. Twisted loop left upper quadrant small bowel shown on image 35/5, with an internal air-fluid level measuring up to 3.7 cm in diameter. Vascular/Lymphatic: Aortoiliac atherosclerotic vascular disease. Reproductive: Soft tissue prominence of the anterior uterus favoring uterine fibroid. Other: No supplemental non-categorized findings. Musculoskeletal: 6.8 cm in diameter umbilical hernia containing adipose tissue extending to the left of midline. Subacute fractures of the right eighth, ninth, tenth, eleventh, twelfth fractures. Several of these are displaced. There are segmental fractures of the right tenth and eleventh ribs. Higher ribs could be fractured but are not included on imaging. There is an oblique fracture through the body of the T10 vertebral body, with associated sclerosis, only partially included on today's exam. Inferior endplate compression fractures at T12 and L1, probably old. Notable hemangioma eccentric to the right in the L1 vertebra. Degenerative arthropathy of the hips, right greater than left. IMPRESSION: 1. Considerable abnormal wall thickening in the proximal duodenum and stomach antrum with surrounding edema. Favor inflammation such as in the setting of peptic ulcer disease over groove pancreatitis as a cause. 2. Multiple acute or subacute right lower subacute rib fractures. Some of these are segmental and displaced. Query recent trauma. 3.  subacute oblique fracture through the T10 vertebral body. Dedicated imaging of the thoracic spine is probably warranted to further workup this injury and assess for other injuries. 4. There is a twisted loop of small bowel in the left upper quadrant, with an internal air-fluid level. No extraluminal gas or obvious vascular compromise at this time. No compelling findings of obstruction. 5. Other imaging findings of potential clinical significance: Mild cardiomegaly. Cylindrical bronchiectasis in the right lower lobe. Mild bibasilar scarring. Hepatic cirrhosis. Nonobstructive right nephrolithiasis. Sigmoid colon diverticulosis. Aortic Atherosclerosis (ICD10-I70.0). 6.8 cm umbilical hernia containing adipose tissue eccentric to the left. Old compression fractures at T12 and L1. Degenerative hip arthropathy, right greater than left. Electronically Signed  By: Van Clines M.D.   On: 08/05/2018 23:57   IMPRESSION AND PLAN:   A/P: 58F p/w F/AP/N/V, incidentally found to have rib Fx, T10 Fx. Hyperglycemia. Disorientation, memory loss. -F/AP/N/V: CT A/P demonstrates a variety of findings. There is no clear obstruction. There appears to be proximal duodenal and gastric antral wall thickening, possible inflammation (i.e. PUD). Etiology unclear. PPI, H2 blocker. Symptomatic mgmt, pain ctrl, antiemetics. -Rib Fx, T10 Fx: 2/2 falls. Not in pain at the time of my assessment. Fall precautions. P/T eval. TLSO brace. MRI T-spine pending. Neurosurgery consult. -Disorientation, memory loss: Pt AAOx1, difficulty w/ location, year, POTUS. Stable mood/affect, good PO intake. Suspect early/mild dementia. Pt to see Neurology soon, likely a good candidate for Aricept/Namenda. -c/w other home meds/formulary subs. -FEN/GI: Cardiac diet as tolerated. -DVT PPx: Lovenox. -Code status: Full code. -Disposition: Admission, > 2 midnights.   All the records are reviewed and case discussed with ED provider. Management plans  discussed with the patient, family and they are in agreement.  CODE STATUS: Full code.  TOTAL TIME TAKING CARE OF THIS PATIENT: 75 minutes.    Arta Silence M.D on 08/06/2018 at 3:50 AM  Between 7am to 6pm - Pager - [phone]  After 6pm go to [email or web address] - Technical brewer [facility]  Office  [phone]  CC: Primary care physician; Tower, Wynelle Fanny, MD   Note: This dictation was prepared with Dragon dictation along with smaller phrase technology. Any transcriptional errors that result from this process are unintentional.

## 2018-08-07 MED ORDER — TRAMADOL HCL 50 MG PO TABS
50.0000 mg | ORAL_TABLET | Freq: Four times a day (QID) | ORAL | Status: DC | PRN
  Administered 2018-08-10: 50 mg via ORAL
  Filled 2018-08-07: qty 1

## 2018-08-07 MED ORDER — CEPHALEXIN 500 MG PO CAPS: 500.0000 mg | ORAL_CAPSULE | Freq: Two times a day (BID) | ORAL | Status: DC

## 2018-08-07 MED ORDER — CEPHALEXIN 500 MG PO CAPS
500.0000 mg | ORAL_CAPSULE | Freq: Three times a day (TID) | ORAL | Status: DC
  Administered 2018-08-07 – 2018-08-10 (×10): 500 mg via ORAL
  Filled 2018-08-07 (×10): qty 1

## 2018-08-07 MED ORDER — MORPHINE SULFATE (PF) 2 MG/ML IV SOLN
2.0000 mg | Freq: Four times a day (QID) | INTRAVENOUS | Status: DC | PRN
  Administered 2018-08-10: 2 mg via INTRAVENOUS
  Filled 2018-08-07: qty 1

## 2018-08-07 NOTE — Clinical Social Work Note (Addendum)
The CSW has attempted to contact the admissions coordinator for [facility] to inquire about the progress on the [place] request. The CSW left a HIPPA compliant voice mail. CSW will follow up with information as available.  UPDATE: The CSW spoke with Linus Orn from [facility]. The patient's [name] is still pending and will most likely not be available until Monday. CSW is following.  Santiago Bumpers, MSW, Latanya Presser [phone]

## 2018-08-07 NOTE — Progress Notes (Signed)
[place] [facility] [place] NAME: [name]    MR#:  [id number]  DATE OF BIRTH:  1934/05/09  SUBJECTIVE:  resting quietly. No issues for RN. Baseline dementia REVIEW OF SYSTEMS:   Review of Systems  Unable to perform ROS: Dementia   Tolerating Diet:yes Tolerating PT: rehab  DRUG ALLERGIES:   Allergies  Allergen Reactions  . Prednisone Other (See Comments)    hyperctivity  . Zithromax [Azithromycin] Other (See Comments)    Prolonged QT on EKG  . Penicillins Hives and Swelling    Questionable allergy (?) Currently on cefazolin, tolerating well since 4/21 Patient is tolerating dicloxacillin without issue. No skin changes or other concerns.   . Sulfonamide Derivatives Hives  . Adhesive [Tape] Other (See Comments) and Rash    redness    VITALS:  Blood pressure (!) 130/58, pulse (!) 45, temperature 98 F (36.7 C), temperature source Oral, resp. rate 17, height 5\' 6"  (1.676 m), weight 92.5 kg, SpO2 92 %.  PHYSICAL EXAMINATION:   Physical Exam  GENERAL:  84 y.o.-year-old patient lying in the bed with no acute distress.  EYES: Pupils equal, round, reactive to light and accommodation. No scleral icterus. Extraocular muscles intact.  HEENT: Head atraumatic, normocephalic. Oropharynx and nasopharynx clear.  NECK:  Supple, no jugular venous distention. No thyroid enlargement, no tenderness.  LUNGS: Normal breath sounds bilaterally, no wheezing, rales, rhonchi. No use of accessory muscles of respiration.  CARDIOVASCULAR: S1, S2 normal. No murmurs, rubs, or gallops.  ABDOMEN: Soft, nontender, nondistended. Bowel sounds present. No organomegaly or mass. Brace+ EXTREMITIES: No cyanosis, clubbing or edema b/l.   Left LE dressing + NEUROLOGIC: Cranial nerves II through XII are intact. No focal Motor or sensory deficits b/l.   PSYCHIATRIC:  patient is a bit sleepy. Baseline dementia  SKIN: No obvious rash, lesion, or ulcer.   LABORATORY  PANEL:  CBC Recent Labs  Lab 08/05/18 1704  WBC 7.3  HGB 14.8  HCT 45.0  PLT 245    Chemistries  Recent Labs  Lab 08/05/18 1704  NA 136  K 3.6  CL 99  CO2 28  GLUCOSE 111*  BUN 19  CREATININE 0.79  CALCIUM 9.3  AST 22  ALT 13  ALKPHOS 61  BILITOT 1.0   Cardiac Enzymes No results for input(s): TROPONINI in the last 168 hours. RADIOLOGY:  Dg Chest 2 View  Result Date: 08/05/2018 CLINICAL DATA:  Initial evaluation for acute nausea, vomiting, body aches. EXAM: CHEST - 2 VIEW COMPARISON:  Prior radiograph from 06/26/2018. FINDINGS: Mild cardiomegaly, stable. Mediastinal silhouette within normal limits. Lungs normally inflated. Mild perihilar vascular congestion without overt pulmonary edema. No pleural effusion. No focal infiltrates. No pneumothorax. No acute osseous abnormality.  Osteopenia noted. IMPRESSION: 1. Mild cardiomegaly with pulmonary venous congestion without overt pulmonary edema. 2. No other active cardiopulmonary disease. Electronically Signed   By: Jeannine Boga M.D.   On: 08/05/2018 23:05   Ct Thoracic Spine Wo Contrast  Result Date: 08/06/2018 CLINICAL DATA:  Back pain and minor trauma EXAM: CT THORACIC SPINE WITHOUT CONTRAST TECHNIQUE: Multidetector CT images of the thoracic were obtained using the standard protocol without intravenous contrast. COMPARISON:  Chest CT 02/22/2015 FINDINGS: Alignment: Normal alignment of the thoracic spine. Vertebrae: There is diffuse idiopathic skeletal hyperostosis with flowing osteophytes leading to fusion of the thoracic spine from T4-T12. There is an acute fracture through the midportion of the T10 vertebral body that extends to the right posterior aspect  of the body but does not breach the spinal canal. There is no extension into the posterior elements and no osseous tension band disruption. Paraspinal and other soft tissues: Small prevertebral hematoma at the T10 level. There is cardiomegaly with calcific aortic  atherosclerosis. Disc levels: No traumatic disc herniation is evident. Large posterior osteophyte at T12-L1 causes mild-to-moderate spinal canal narrowing.No other bony spinal canal stenosis. IMPRESSION: 1. Acute fracture of the rigid spine in the context of diffuse idiopathic skeletal hyperostosis. Mildly distracted fracture through the midportion of the T10 vertebral body extends to the posterior right wall. No posterior tension band disruption or extension to the posterior elements. Thoracic spine MRI is recommended. 2. Cardiomegaly and calcific aortic atherosclerosis (ICD10-I70.0). These results were called by telephone at the time of interpretation on 08/06/2018 at 1:40 am to Dr. Beather Arbour , who verbally acknowledged these results. Electronically Signed   By: Ulyses Jarred M.D.   On: 08/06/2018 01:41   Mr Thoracic Spine Wo Contrast  Result Date: 08/06/2018 CLINICAL DATA:  Recent fall with T10 fracture. EXAM: MRI THORACIC SPINE WITHOUT CONTRAST TECHNIQUE: Multiplanar, multisequence MR imaging of the thoracic spine was performed. No intravenous contrast was administered. COMPARISON:  Thoracic spine CT 08/06/2018 FINDINGS: Alignment: Exaggerated thoracic kyphosis. No traumatic retrolisthesis. There is mild degenerative retrolisthesis at L1-2 Vertebrae: Horizontal hypointense fracture plane with mild adjacent marrow edema in the T10 body. The bone is sclerotic by CT and the fracture margins are also indistinct by CT. No retropulsion is seen. No evident underlying aggressive bone lesion. No additional fracture is noted. Cord:  Normal signal and morphology. Paraspinal and other soft tissues: No paravertebral edema is noted. Disc levels: Diffuse flowing osteophytes with multilevel thoracic ankylosis seen from T5-T11 by CT. There is advanced disc degeneration at T11-12, T12-L1, and L1-2. At T12-L1 there is a central disc protrusion with buttressing osteophytes, although noncompressive. Foraminal narrowing at T12-L1 and  L1-2. IMPRESSION: 1. T10 vertebral body fracture with subacute appearance. No retropulsion or impingement. 2. Diffuse idiopathic skeletal hyperostosis with multilevel thoracic ankylosis. Electronically Signed   By: Monte Fantasia M.D.   On: 08/06/2018 11:37   Ct Abdomen Pelvis W Contrast  Result Date: 08/05/2018 CLINICAL DATA:  Generalized abdominal pain with nausea and vomiting and body aches since last night. EXAM: CT ABDOMEN AND PELVIS WITH CONTRAST TECHNIQUE: Multidetector CT imaging of the abdomen and pelvis was performed using the standard protocol following bolus administration of intravenous contrast. CONTRAST:  175mL ISOVUE-300 IOPAMIDOL (ISOVUE-300) INJECTION 61% COMPARISON:  Multiple exams, including CT right hip from 06/04/2018 and abdominal radiographs from 06/26/2017 FINDINGS: Lower chest: Mild cardiomegaly. Cylindrical bronchiectasis in the right lower lobe. Scarring or mild atelectasis in the right middle lobe and lingula. Hepatobiliary: Slight nodularity of the hepatic margin suspicious for cirrhosis. Cholecystectomy. Pancreas: There is stranding along the pancreaticoduodenal groove, although I feel this is more likely to be due to proximal duodenal inflammation than pancreatitis. Spleen: Unremarkable Adrenals/Urinary Tract: 2 mm right kidney lower pole nonobstructive renal calculus on image 33/2. Punctate calcifications anterior to the left psoas muscle for example on image 40/2 are thought to be venous rather than ureteral. 0.8 by 0.6 cm hypodense lesion in the right mid kidney on image 19/7 is statistically likely to be a cyst but technically nonspecific due to small size. Stomach/Bowel: Wall thickening along the descending duodenum with surrounding edema/fluid. There is potentially some wall thickening in the stomach antrum as well. Sigmoid colon diverticulosis.  No active diverticulitis. Twisted loop left upper quadrant small  bowel shown on image 35/5, with an internal air-fluid level  measuring up to 3.7 cm in diameter. Vascular/Lymphatic: Aortoiliac atherosclerotic vascular disease. Reproductive: Soft tissue prominence of the anterior uterus favoring uterine fibroid. Other: No supplemental non-categorized findings. Musculoskeletal: 6.8 cm in diameter umbilical hernia containing adipose tissue extending to the left of midline. Subacute fractures of the right eighth, ninth, tenth, eleventh, twelfth fractures. Several of these are displaced. There are segmental fractures of the right tenth and eleventh ribs. Higher ribs could be fractured but are not included on imaging. There is an oblique fracture through the body of the T10 vertebral body, with associated sclerosis, only partially included on today's exam. Inferior endplate compression fractures at T12 and L1, probably old. Notable hemangioma eccentric to the right in the L1 vertebra. Degenerative arthropathy of the hips, right greater than left. IMPRESSION: 1. Considerable abnormal wall thickening in the proximal duodenum and stomach antrum with surrounding edema. Favor inflammation such as in the setting of peptic ulcer disease over groove pancreatitis as a cause. 2. Multiple acute or subacute right lower subacute rib fractures. Some of these are segmental and displaced. Query recent trauma. 3. subacute oblique fracture through the T10 vertebral body. Dedicated imaging of the thoracic spine is probably warranted to further workup this injury and assess for other injuries. 4. There is a twisted loop of small bowel in the left upper quadrant, with an internal air-fluid level. No extraluminal gas or obvious vascular compromise at this time. No compelling findings of obstruction. 5. Other imaging findings of potential clinical significance: Mild cardiomegaly. Cylindrical bronchiectasis in the right lower lobe. Mild bibasilar scarring. Hepatic cirrhosis. Nonobstructive right nephrolithiasis. Sigmoid colon diverticulosis. Aortic Atherosclerosis  (ICD10-I70.0). 6.8 cm umbilical hernia containing adipose tissue eccentric to the left. Old compression fractures at T12 and L1. Degenerative hip arthropathy, right greater than left. Electronically Signed   By: Van Clines M.D.   On: 08/05/2018 23:57   ASSESSMENT AND PLAN:   SHARAYA BORUFF is a 84 y.o. female with a history of anemia, diverticulosis, afib, hypertension, hyperlipidemia, OSA on CPAP hypothyroidism presents for evaluation of nausea and vomiting.  Patient has had subjective fever, nausea, several episodes of nonbloody nonbilious emesis since last night and body aches.  No diarrhea or constipation.  * T10 vertebral fracture.  Nurse neurosurgery consult noted-- no surgical indication. Recommends use brace while ambulating. -  Pain control.  TLSO brace.  - Physical therapy consult appreciated.  * Gastritis/duodenitis on CT scan.  Start Protonix.  Zofran PRN.  * Rib fractures- stable  * Dementia  *Chronic left anterior knee and posterior leg wound. Appreciate wound care nurse consult. Continue dressing change per instruction. Currently on Keflex as outpatient patient follows in the wound care center  [place] working on discharge planning. Awaiting  [employer] authorization  Case discussed with Care Management/Social Worker. Management plans discussed with the patient, family and they are in agreement.  CODE STATUS: DNR  DVT Prophylaxis: lovenox  TOTAL TIME TAKING CARE OF THIS PATIENT: *30* minutes.  >50% time spent on counselling and coordination of care  POSSIBLE D/C IN *1-2* DAYS, DEPENDING ON CLINICAL CONDITION.  Note: This dictation was prepared with Dragon dictation along with smaller phrase technology. Any transcriptional errors that result from this process are unintentional.  Fritzi Mandes M.D on 08/07/2018 at 7:29 AM  Between 7am to 6pm - Pager - [phone]  After 6pm go to [email or web address] - Proofreader  [employer]   [phone]  CC: Primary care physician; Tower, Wynelle Fanny, MDPatient ID: [name], female   DOB: [birth year], 84 y.o.   MRN: [id number]

## 2018-08-07 NOTE — Progress Notes (Signed)
Physical Therapy Treatment Patient Details Name: [name] MRN: [id number] DOB: 06-08-1934 Today's Date: 08/07/2018    History of Present Illness Pt is an 84 y.o. female presenting to hospital 08/05/18 with N/V and dull lower abdominal pain; also mechanical fall Sunday transferring car to w/c.  Pt admitted with N/V, rib fx's (multiple acute to subacute R lower rib fx's segmental and displaced), T10 fx (subacute oblique fx through T10 vertebral body; also old compression fx T12 and L1), disorientation, and memory loss.  Neurosurgery recommending TLSO brace.  PMH includes 6.8 cm umbilical hernia, home O2 2 L at night, anemia, a-fib, htn, OSA on CPAP, B TKA, overactive bladder, PSVT, PE, pulmonary htn, L LE anterior and posterior wounds.    PT Comments    Pt in bed, feeling well, ready to get up.  To edge of bed with min a x 2.  Pt with poor hand placements and requires significant verbal cues for rolling and technique.  Once sitting, poor sitting balance with frequent cues for posture and to correct imbalances with post lean.  Back brace donned but with poor positioning.  Removed and replaced with improved fit.  Stood with min a x 2.  She was able to transfer to recliner at bedside with verbal cues and min a x 2.  Pt voiced and demonstrated fear of falling - at one time, grabbing for RN's arm while turning.  Educated on safety and to maintain hands on walker at all times.  Remained in recliner after session.   Follow Up Recommendations  SNF     Equipment Recommendations  Rolling walker with 5" wheels;Wheelchair (measurements PT);Wheelchair cushion (measurements PT);3in1 (PT)    Recommendations for Other Services       Precautions / Restrictions Precautions Precautions: Fall;Back Precaution Comments: L knee and posterior L LE wounds Required Braces or Orthoses: Spinal Brace Spinal Brace: Thoracolumbosacral orthotic Restrictions Weight Bearing Restrictions: No    Mobility  Bed  Mobility Overal bed mobility: Needs Assistance Bed Mobility: Sidelying to Sit;Rolling Rolling: Min assist;Mod assist Sidelying to sit: Min assist;Mod assist       General bed mobility comments: vc's for logrolling technique; assist for logrolling; increased time for pt to bring LE's off of bed; assist for trunk  Transfers Overall transfer level: Needs assistance Equipment used: Rolling walker (2 wheeled) Transfers: Sit to/from Stand Sit to Stand: Min assist;+2 physical assistance         General transfer comment: Generally fearful of falling  Ambulation/Gait Ambulation/Gait assistance: Min assist;+2 physical assistance Gait Distance (Feet): 3 Feet Assistive device: Rolling walker (2 wheeled) Gait Pattern/deviations: Step-to pattern Gait velocity: decreased Gait velocity interpretation: <1.31 ft/sec, indicative of household Metallurgist Rankin (Stroke Patients Only)       Balance Overall balance assessment: Needs assistance Sitting-balance support: Bilateral upper extremity supported;Feet supported Sitting balance-Leahy Scale: Poor     Standing balance support: Bilateral upper extremity supported Standing balance-Leahy Scale: Poor Standing balance comment: pt with posterior lean initially standing requiring assist to shift weight forward and increase UE support through [employer] Arousal/Alertness: Awake/alert Behavior During Therapy: [facility] for tasks assessed/performed Overall Cognitive Status: Within Functional Limits for tasks assessed  Exercises      General Comments        Pertinent Vitals/Pain Pain Assessment: No/denies pain    Home Living                      Prior Function            PT Goals (current goals can now be found in the care plan section) Progress towards PT goals:  Progressing toward goals    Frequency    7X/week      PT Plan Current plan remains appropriate    Co-evaluation              AM-PAC PT "6 Clicks" Daily Activity  Outcome Measure  Difficulty turning over in bed (including adjusting bedclothes, sheets and blankets)?: Unable Difficulty moving from lying on back to sitting on the side of the bed? : Unable Difficulty sitting down on and standing up from a chair with arms (e.g., wheelchair, bedside commode, etc,.)?: Unable Help needed moving to and from a bed to chair (including a wheelchair)?: Total Help needed walking in hospital room?: Total Help needed climbing 3-5 steps with a railing? : Total 6 Click Score: 6    End of Session Equipment Utilized During Treatment: Gait belt;Back brace Activity Tolerance: Patient limited by fatigue Patient left: in chair;with call bell/phone within reach;with chair alarm set;with family/visitor present         Time: 0957-1010 PT Time Calculation (min) (ACUTE ONLY): 13 min  Charges:  $Therapeutic Activity: 8-22 mins                     Chesley Noon, PTA 08/07/18, 11:22 AM

## 2018-08-08 NOTE — Progress Notes (Signed)
Physical Therapy Treatment Patient Details Name: [name] MRN: [id number] DOB: July 07, 1934 Today's Date: 08/08/2018    History of Present Illness Pt is an 84 y.o. female presenting to hospital 08/05/18 with N/V and dull lower abdominal pain; also mechanical fall Sunday transferring car to w/c.  Pt admitted with N/V, rib fx's (multiple acute to subacute R lower rib fx's segmental and displaced), T10 fx (subacute oblique fx through T10 vertebral body; also old compression fx T12 and L1), disorientation, and memory loss.  Neurosurgery recommending TLSO brace.  PMH includes 6.8 cm umbilical hernia, home O2 2 L at night, anemia, a-fib, htn, OSA on CPAP, B TKA, overactive bladder, PSVT, PE, pulmonary htn, L LE anterior and posterior wounds.    PT Comments    Pt remains calm and participatory but visibly and audibly anxious with transfers. Sitting at EOB also difficult d/t continued retropulsion of trunk. Ultimately decided to eschew RW during stand pivot to chair as to have closer posturing with the patient for safety. Pt remains weak in BLE. Difficulty adjusting TLSO to correct placement d/t body shape, donned upon entry with corset superior toward axilla bilat.    Follow Up Recommendations  SNF     Equipment Recommendations  Rolling walker with 5" wheels;Wheelchair (measurements PT);Wheelchair cushion (measurements PT);3in1 (PT)    Recommendations for Other Services OT consult     Precautions / Restrictions Precautions Precautions: Fall;Back Precaution Comments: L knee and posterior L LE wounds Required Braces or Orthoses: Spinal Brace Spinal Brace: Thoracolumbosacral orthotic Restrictions Weight Bearing Restrictions: No    Mobility  Bed Mobility Overal bed mobility: Needs Assistance Bed Mobility: Sidelying to Sit;Rolling Rolling: Min assist Sidelying to sit: Max assist       General bed mobility comments: no recollection of prior education on log rolling, heavy assist for  trunk righting from sidelying   Transfers Overall transfer level: Needs assistance Equipment used: Rolling walker (2 wheeled);None Transfers: Sit to/from Stand Sit to Stand: +2 physical assistance;+2 safety/equipment;Max assist Stand pivot transfers: Max assist;+2 physical assistance;+2 safety/equipment       General transfer comment: posterior lean throughout despite VC to correct, audibly anxious. STS 4 x with adn without RW heavy pysical assist needed, even from elevated surface. Poor confidence and knee stability.   Ambulation/Gait Ambulation/Gait assistance: (not appropriate at this time d/t knees buckling and pt anxious beyond following VC for safety. )               Stairs             Wheelchair Mobility    Modified Rankin (Stroke Patients Only)       Balance     Sitting balance-Leahy Scale: Poor Sitting balance - Comments: continuous posterior lean, reluctance to hip hinge forward.    Standing balance support: Bilateral upper extremity supported;During functional activity Standing balance-Leahy Scale: Zero Standing balance comment: posterio lean, unabel to obtain balance.                             Cognition Arousal/Alertness: Awake/alert Behavior During Therapy: WFL for tasks assessed/performed Overall Cognitive Status: Within Functional Limits for tasks assessed                                        Exercises      General Comments  Pertinent Vitals/Pain Pain Assessment: No/denies pain    Home Living                      Prior Function            PT Goals (current goals can now be found in the care plan section) Acute Rehab PT Goals Patient Stated Goal: to improve strength and mobility PT Goal Formulation: With patient/family Time For Goal Achievement: 08/20/18 Potential to Achieve Goals: Fair Progress towards PT goals: Not progressing toward goals - comment    Frequency     7X/week      PT Plan Current plan remains appropriate    Co-evaluation              AM-PAC PT "6 Clicks" Daily Activity  Outcome Measure  Difficulty turning over in bed (including adjusting bedclothes, sheets and blankets)?: Unable Difficulty moving from lying on back to sitting on the side of the bed? : Unable Difficulty sitting down on and standing up from a chair with arms (e.g., wheelchair, bedside commode, etc,.)?: Unable Help needed moving to and from a bed to chair (including a wheelchair)?: A Lot Help needed walking in hospital room?: Total Help needed climbing 3-5 steps with a railing? : Total 6 Click Score: 7    End of Session Equipment Utilized During Treatment: Gait belt;Back brace Activity Tolerance: Patient limited by fatigue Patient left: in chair;with call bell/phone within reach;with chair alarm set;with family/visitor present Nurse Communication: Mobility status;Precautions PT Visit Diagnosis: Unsteadiness on feet (R26.81);Other abnormalities of gait and mobility (R26.89);Muscle weakness (generalized) (M62.81);History of falling (Z91.81);Difficulty in walking, not elsewhere classified (R26.2)     Time: 6962-9528 PT Time Calculation (min) (ACUTE ONLY): 21 min  Charges:  $Therapeutic Activity: 8-22 mins                     8:45 AM, 08/08/18 Etta Grandchild, PT, DPT Physical Therapist - [facility]  [phone] ([place])     Buccola,Allan C 08/08/2018, 8:43 AM

## 2018-08-08 NOTE — Progress Notes (Signed)
[facility] [place] NAME: [name]    MR#:  [id number]  DATE OF BIRTH:  12-05-1933  SUBJECTIVE:  resting quietly. No issues for RN. Baseline dementia. Patient out in the chair. Daughter in the room. Denies any pain. Eating breakfast. REVIEW OF SYSTEMS:   Review of Systems  Constitutional: Negative for chills, fever and weight loss.  HENT: Negative for ear discharge, ear pain and nosebleeds.   Eyes: Negative for blurred vision, pain and discharge.  Respiratory: Negative for sputum production, shortness of breath, wheezing and stridor.   Cardiovascular: Negative for chest pain, palpitations, orthopnea and PND.  Gastrointestinal: Negative for abdominal pain, diarrhea, nausea and vomiting.  Genitourinary: Negative for frequency and urgency.  Musculoskeletal: Negative for back pain and joint pain.  Neurological: Negative for sensory change, speech change, focal weakness and weakness.  Psychiatric/Behavioral: Negative for depression and hallucinations. The patient is not nervous/anxious.    Tolerating Diet:yes Tolerating PT: rehab  DRUG ALLERGIES:   Allergies  Allergen Reactions  . Prednisone Other (See Comments)    hyperctivity  . Zithromax [Azithromycin] Other (See Comments)    Prolonged QT on EKG  . Penicillins Hives and Swelling    Questionable allergy (?) Currently on cefazolin, tolerating well since 4/21 Patient is tolerating dicloxacillin without issue. No skin changes or other concerns.   . Sulfonamide Derivatives Hives  . Adhesive [Tape] Other (See Comments) and Rash    redness    VITALS:  Blood pressure 127/81, pulse 69, temperature 98.2 F (36.8 C), temperature source Oral, resp. rate 18, height 5\' 6"  (1.676 m), weight 92.5 kg, SpO2 93 %.  PHYSICAL EXAMINATION:   Physical Exam  GENERAL:  84 y.o.-year-old patient lying in the bed with no acute distress.  EYES: Pupils equal, round, reactive to light and  accommodation. No scleral icterus. Extraocular muscles intact.  HEENT: Head atraumatic, normocephalic. Oropharynx and nasopharynx clear.  NECK:  Supple, no jugular venous distention. No thyroid enlargement, no tenderness.  LUNGS: Normal breath sounds bilaterally, no wheezing, rales, rhonchi. No use of accessory muscles of respiration.  CARDIOVASCULAR: S1, S2 normal. No murmurs, rubs, or gallops.  ABDOMEN: Soft, nontender, nondistended. Bowel sounds present. No organomegaly or mass. Brace+ EXTREMITIES: No cyanosis, clubbing or edema b/l.   Left LE dressing + NEUROLOGIC: Cranial nerves II through XII are intact. No focal Motor or sensory deficits b/l.   PSYCHIATRIC:  patient is a bit sleepy. Baseline dementia  SKIN: No obvious rash, lesion, or ulcer.   LABORATORY PANEL:  CBC Recent Labs  Lab 08/05/18 1704  WBC 7.3  HGB 14.8  HCT 45.0  PLT 245    Chemistries  Recent Labs  Lab 08/05/18 1704  NA 136  K 3.6  CL 99  CO2 28  GLUCOSE 111*  BUN 19  CREATININE 0.79  CALCIUM 9.3  AST 22  ALT 13  ALKPHOS 61  BILITOT 1.0   Cardiac Enzymes No results for input(s): TROPONINI in the last 168 hours. RADIOLOGY:  Mr Thoracic Spine Wo Contrast  Result Date: 08/06/2018 CLINICAL DATA:  Recent fall with T10 fracture. EXAM: MRI THORACIC SPINE WITHOUT CONTRAST TECHNIQUE: Multiplanar, multisequence MR imaging of the thoracic spine was performed. No intravenous contrast was administered. COMPARISON:  Thoracic spine CT 08/06/2018 FINDINGS: Alignment: Exaggerated thoracic kyphosis. No traumatic retrolisthesis. There is mild degenerative retrolisthesis at L1-2 Vertebrae: Horizontal hypointense fracture plane with mild adjacent marrow edema in the T10 body. The bone is sclerotic by CT and  the fracture margins are also indistinct by CT. No retropulsion is seen. No evident underlying aggressive bone lesion. No additional fracture is noted. Cord:  Normal signal and morphology. Paraspinal and other soft  tissues: No paravertebral edema is noted. Disc levels: Diffuse flowing osteophytes with multilevel thoracic ankylosis seen from T5-T11 by CT. There is advanced disc degeneration at T11-12, T12-L1, and L1-2. At T12-L1 there is a central disc protrusion with buttressing osteophytes, although noncompressive. Foraminal narrowing at T12-L1 and L1-2. IMPRESSION: 1. T10 vertebral body fracture with subacute appearance. No retropulsion or impingement. 2. Diffuse idiopathic skeletal hyperostosis with multilevel thoracic ankylosis. Electronically Signed   By: Monte Fantasia M.D.   On: 08/06/2018 11:37   ASSESSMENT AND PLAN:   [name] is a 84 y.o. female with a history of anemia, diverticulosis, afib, hypertension, hyperlipidemia, OSA on CPAP hypothyroidism presents for evaluation of nausea and vomiting.  Patient has had subjective fever, nausea, several episodes of nonbloody nonbilious emesis since last night and body aches.  No diarrhea or constipation.  * T10 vertebral fracture.  Nurse neurosurgery consult noted-- no surgical indication. Recommends use brace while ambulating. -  Pain control.  TLSO brace.  - Physical therapy consult appreciated.  * Gastritis/duodenitis on CT scan.  Start Protonix.  Zofran PRN.  * Rib fractures- stable  * Dementia  *Chronic left anterior knee and posterior leg wound. Appreciate wound care nurse consult. Continue dressing change per instruction. Currently on Keflex as outpatient patient follows in the wound care center  [place] working on discharge planning. Awaiting  UHC authorization  Case discussed with Care Management/Social Worker. Management plans discussed with the patient, family and they are in agreement.  CODE STATUS: DNR  DVT Prophylaxis: lovenox  TOTAL TIME TAKING CARE OF THIS PATIENT: *30* minutes.  >50% time spent on counselling and coordination of care  POSSIBLE D/C IN *1-2* DAYS, DEPENDING ON CLINICAL CONDITION.  Note: This dictation was  prepared with Dragon dictation along with smaller phrase technology. Any transcriptional errors that result from this process are unintentional.  Fritzi Mandes M.D on 08/08/2018 at 9:17 AM  Between 7am to 6pm - Pager - [phone]  After 6pm go to [email or web address] - password [email or web address] [facility]  Office  [phone]  CC: Primary care physician; Tower, Wynelle Fanny, MDPatient ID: [name], female   DOB: May 10, 1934, 84 y.o.   MRN: [id number]

## 2018-08-09 MED ORDER — TRAMADOL HCL 50 MG PO TABS: 50.0000 mg | ORAL_TABLET | Freq: Three times a day (TID) | ORAL | 0 refills | Status: DC | PRN

## 2018-08-09 MED ORDER — CEPHALEXIN 500 MG PO CAPS: 500.0000 mg | ORAL_CAPSULE | Freq: Three times a day (TID) | ORAL | 0 refills | Status: AC

## 2018-08-09 MED ORDER — ALPRAZOLAM 0.5 MG PO TBDP: 0.5000 mg | ORAL_TABLET | Freq: Two times a day (BID) | ORAL | 0 refills | Status: DC | PRN

## 2018-08-09 NOTE — Progress Notes (Signed)
SLP Cancellation Note  Patient Details Name: [name] MRN: [id number] DOB: [birth year]   Cancelled treatment:       Reason Eval/Treat Not Completed: Patient declined; Family present reported changes in cognition and hx of swallowing difficulty (resulting in ST services previously), however patient preferred not to be evaluated by skilled ST services and stated "I don't think I need it" even after thorough education of services. Family stated that patient "just wants to go home" today. At this time, skilled ST services not indicated d/t patient preference and timing of services on day of discharge.   Loni Beckwith, M.S. CCC-SLP Speech-Language Pathologist  Loni Beckwith 08/09/2018, 1:05 PM

## 2018-08-09 NOTE — Care Management (Signed)
RNCM has updated [name] with [facility] that patient discharge plan is to SNF at [facility].

## 2018-08-09 NOTE — Plan of Care (Signed)
  Problem: Education: Goal: Knowledge of General Education information will improve Description Including pain rating scale, medication(s)/side effects and non-pharmacologic comfort measures Outcome: Progressing   

## 2018-08-09 NOTE — Discharge Summary (Addendum)
[facility] at [place] NAME: [name]    MR#:  [id number]  DATE OF BIRTH:  [birth year]  DATE OF ADMISSION:  08/05/2018 ADMITTING PHYSICIAN: [name] Silence, MD  DATE OF DISCHARGE: 08/09/2018  PRIMARY CARE PHYSICIAN: [name], [name] Fanny, MD    ADMISSION DIAGNOSIS:  PUD (peptic ulcer disease) [K27.9] Acute gastritis without hemorrhage, unspecified gastritis type [K29.00] Closed stable burst fracture of tenth thoracic vertebra, initial encounter ([place]) [S22.071A] Closed fracture of body of posterior thoracic vertebra, initial encounter ([place]) [E33.295J]  DISCHARGE DIAGNOSIS:  T10 close vertebral fracture-- conservative management. TLSO brace acute gastritis/duodenitis on CT scan  SECONDARY DIAGNOSIS:   Past Medical History:  Diagnosis Date  . Anemia   . Arthritis    knee  . Diverticulosis   . Hx of colonic polyps 10/24/2014  . Hyperlipidemia   . Hypertension   . Osteopenia   . Overactive bladder    urge incontinence  . PSVT (paroxysmal supraventricular tachycardia) ([place])   . Pulmonary emboli ([place])   . Pulmonary hypertension ([place])   . Right ventricular failure ([place])   . Sleep apnea    CPAP  . Spinal stenosis     HOSPITAL COURSE:   [name] a 84 y.o.femalewith a history of anemia, diverticulosis,afib,hypertension, hyperlipidemia, OSA on CPAP hypothyroidism presents for evaluation of nausea and vomiting. Patient has had subjective fever, nausea,several episodes of nonbloody nonbilious emesis since last night and body aches. No diarrhea or constipation.  *T10 vertebral fracture. - per neurosurgery consult noted-- no surgical indication. Recommends use brace while ambulating. - Pain control. TLSO brace.  -Physical therapy consult appreciated.  * Gastritis/duodenitis on CT scan. now on famotidine  - Zofran PRN.  * Rib fractures- stable  * Dementia  *Chronic left anterior knee and posterior  leg wound. Appreciate wound care nurse consult. Continue dressing change per instruction. Currently on Keflex as outpatient patient follows in the wound care center  [place] working on discharge planning. Awaiting  UHC authorization D/c today to [facility]  Patient will be followed by palliative care as outpatient. CONSULTS OBTAINED:  Treatment Team:  [name] Silence, MD  DRUG ALLERGIES:   Allergies  Allergen Reactions  . Prednisone Other (See Comments)    hyperctivity  . Zithromax [Azithromycin] Other (See Comments)    Prolonged QT on EKG  . Penicillins Hives and Swelling    Questionable allergy (?) Currently on cefazolin, tolerating well since 4/21 Patient is tolerating dicloxacillin without issue. No skin changes or other concerns.   . Sulfonamide Derivatives Hives  . Adhesive [Tape] Other (See Comments) and Rash    redness    DISCHARGE MEDICATIONS:   Allergies as of 08/09/2018      Reactions   Prednisone Other (See Comments)   hyperctivity   Zithromax [azithromycin] Other (See Comments)   Prolonged QT on EKG   Penicillins Hives, Swelling   Questionable allergy (?) Currently on cefazolin, tolerating well since 4/21 Patient is tolerating dicloxacillin without issue. No skin changes or other concerns.    Sulfonamide Derivatives Hives   Adhesive [tape] Other (See Comments), Rash   redness      Medication List    STOP taking these medications   ranitidine 150 MG tablet Commonly known as:  ZANTAC     TAKE these medications   ALPRAZolam 0.5 MG dissolvable tablet Commonly known as:  NIRAVAM Take 1 tablet (0.5 mg total) by mouth 2 (two) times daily as needed for anxiety. What changed:  Another medication with the same name was removed. Continue taking this medication, and follow the directions you see here.   benzonatate 200 MG capsule Commonly known as:  TESSALON Take 1 capsule (200 mg total) by mouth 2 (two) times daily as needed for cough.   CALCIUM  600/VITAMIN D3 600-800 MG-UNIT Tabs Generic drug:  Calcium Carb-Cholecalciferol Take 1 tablet by mouth 2 (two) times daily. What changed:  Another medication with the same name was removed. Continue taking this medication, and follow the directions you see here.   cephALEXin 500 MG capsule Commonly known as:  KEFLEX Take 1 capsule (500 mg total) by mouth every 8 (eight) hours. What changed:  when to take this   famotidine 20 MG tablet Commonly known as:  PEPCID Take 1 tablet (20 mg total) by mouth 2 (two) times daily. What changed:  when to take this   FLUoxetine 40 MG capsule Commonly known as:  PROZAC Take 1 capsule (40 mg total) by mouth daily.   hydrochlorothiazide 25 MG tablet Commonly known as:  HYDRODIURIL TAKE 1 TABLET DAILY   levothyroxine 50 MCG tablet Commonly known as:  SYNTHROID, LEVOTHROID Take 1 tablet (50 mcg total) by mouth daily.   Melatonin 5 MG Tabs Take 1 tablet by mouth at bedtime.   metoprolol tartrate 25 MG tablet Commonly known as:  LOPRESSOR Take 0.5 tablets (12.5 mg total) by mouth 2 (two) times daily.   naproxen sodium 220 MG tablet Commonly known as:  ALEVE Take 220 mg by mouth daily as needed (pain).   ondansetron 4 MG disintegrating tablet Commonly known as:  ZOFRAN-ODT Take 1 tablet (4 mg total) by mouth every 8 (eight) hours as needed for nausea or vomiting.   OXYGEN Inhale 2 L into the lungs at bedtime.   PRESERVISION AREDS Caps Take 2 capsules by mouth daily.   SYSTANE 0.4-0.3 % Soln Generic drug:  Polyethyl Glycol-Propyl Glycol Apply 1 drop to eye 2 (two) times daily.   traMADol 50 MG tablet Commonly known as:  ULTRAM Take 1 tablet (50 mg total) by mouth every 8 (eight) hours as needed for moderate pain.   Vitamin B12-Folic Acid 169-678 MCG Tabs Take 1 tablet by mouth daily.   vitamin C 250 MG tablet Commonly known as:  ASCORBIC ACID Take 250 mg by mouth daily.   VITAMIN D-1000 MAX ST 1000 units tablet Generic drug:   Cholecalciferol Take 1,000 Units by mouth daily.       If you experience worsening of your admission symptoms, develop shortness of breath, life threatening emergency, suicidal or homicidal thoughts you must seek medical attention immediately by calling 911 or calling your MD immediately  if symptoms less severe.  You Must read complete instructions/literature along with all the possible adverse reactions/side effects for all the Medicines you take and that have been prescribed to you. Take any new Medicines after you have completely understood and accept all the possible adverse reactions/side effects.   Please note  You were cared for by a hospitalist during your hospital stay. If you have any questions about your discharge medications or the care you received while you were in the hospital after you are discharged, you can call the unit and asked to speak with the hospitalist on call if the hospitalist that took care of you is not available. Once you are discharged, your primary care physician will handle any further medical issues. Please note that NO REFILLS for any discharge medications will be authorized once you  are discharged, as it is imperative that you return to your primary care physician (or establish a relationship with a primary care physician if you do not have one) for your aftercare needs so that they can reassess your need for medications and monitor your lab values. Today   SUBJECTIVE   Doing well Daughter in the room  VITAL SIGNS:  Blood pressure 121/85, pulse 74, temperature 97.6 F (36.4 C), temperature source Oral, resp. rate 17, height 5\' 6"  (1.676 m), weight 92.5 kg, SpO2 94 %.  I/O:    Intake/Output Summary (Last 24 hours) at 08/09/2018 0800 Last data filed at 08/08/2018 1350 Gross per 24 hour  Intake 360 ml  Output 200 ml  Net 160 ml    PHYSICAL EXAMINATION:  GENERAL:  84 y.o.-year-old patient lying in the bed with no acute distress.  EYES: Pupils  equal, round, reactive to light and accommodation. No scleral icterus. Extraocular muscles intact.  HEENT: Head atraumatic, normocephalic. Oropharynx and nasopharynx clear.  NECK:  Supple, no jugular venous distention. No thyroid enlargement, no tenderness.  LUNGS: Normal breath sounds bilaterally, no wheezing, rales,rhonchi or crepitation. No use of accessory muscles of respiration. TLSO brace + CARDIOVASCULAR: S1, S2 normal. No murmurs, rubs, or gallops.  ABDOMEN: Soft, non-tender, non-distended. Bowel sounds present. No organomegaly or mass.  EXTREMITIES: No pedal edema, cyanosis, or clubbing.  NEUROLOGIC: Cranial nerves II through XII are intact. Muscle strength 5/5 in all extremities. Sensation intact. Gait not checked.  PSYCHIATRIC: The patient is alert and oriented x 3.  SKIN: No obvious rash, lesion, or ulcer.   DATA REVIEW:   CBC  Recent Labs  Lab 08/05/18 1704  WBC 7.3  HGB 14.8  HCT 45.0  PLT 245    Chemistries  Recent Labs  Lab 08/05/18 1704  NA 136  K 3.6  CL 99  CO2 28  GLUCOSE 111*  BUN 19  CREATININE 0.79  CALCIUM 9.3  AST 22  ALT 13  ALKPHOS 61  BILITOT 1.0    Microbiology Results   No results found for this or any previous visit (from the past 240 hour(s)).  RADIOLOGY:  No results found.   Management plans discussed with the patient, family and they are in agreement.  CODE STATUS:     Code Status Orders  (From admission, onward)         Start     Ordered   08/06/18 1334  Do not attempt resuscitation (DNR)  Continuous    Question Answer Comment  In the event of cardiac or respiratory ARREST Do not call a "code blue"   In the event of cardiac or respiratory ARREST Do not perform Intubation, CPR, defibrillation or ACLS   In the event of cardiac or respiratory ARREST Use medication by any route, position, wound care, and other measures to relive pain and suffering. May use oxygen, suction and manual treatment of airway obstruction as  needed for comfort.      08/06/18 1333        Code Status History    Date Active Date Inactive Code Status Order ID Comments User Context   08/06/2018 0432 08/06/2018 1333 Full Code [id number]  [name] Silence, MD Inpatient   03/02/2015 1840 03/03/2015 2006 DNR [id number]  Evans Lance, MD Inpatient   02/28/2015 1225 03/02/2015 1840 DNR [id number]  Troy Sine, MD Inpatient   02/25/2015 1153 02/28/2015 1225 DNR [id number]  Samella Parr, NP Inpatient   02/10/2015 1717 02/15/2015 2123 Full Code  [id number]  Velvet Bathe, MD Inpatient   07/04/2014 1033 07/04/2014 1947 Full Code [id number]  Jolaine Artist, MD Inpatient   03/14/2014 0218 03/16/2014 1644 DNR [id number]  Barton Dubois, MD ED   04/26/2013 0018 04/26/2013 1743 Full Code [id number]  Rise Patience, MD Inpatient   11/23/2012 0452 11/27/2012 1615 Full Code [id number]  Lorie Phenix, RN Inpatient   01/12/2012 1018 01/15/2012 1435 Full Code [id number]  Prudencio Pair Rudder, RN Inpatient    Advance Directive Documentation     Most Recent Value  Type of Advance Directive  Healthcare Power of Attorney, Living will  Pre-existing out of facility DNR order (yellow form or pink MOST form)  -  "MOST" Form in Place?  -      TOTAL TIME TAKING CARE OF THIS PATIENT: *40* minutes.    Fritzi Mandes M.D on 08/09/2018 at 8:00 AM  Between 7am to 6pm - Pager - [phone] After 6pm go to [email or web address] - password [email or web address] [facility]  Office  [phone]  CC: Primary care physician; [name], [name] Fanny, MD

## 2018-08-09 NOTE — Care Management Important Message (Signed)
Copy of signed IM left with patient in room.  

## 2018-08-09 NOTE — Progress Notes (Signed)
Clinical Education officer, museum (CSW) contacted [facility] to inquire about SNF authorization. Per [facility] representative the case reference # is [id number] and SNF authorization is still pending. Per [facility] the case manager is requesting clinicals. CSW made [facility] aware that clinicals has been faxed twice now by [employer]. CSW faxed clinicals for a third time this afternoon. Patient's daughter Shauna Hugh is aware of above.   [employer], LCSW [phone]

## 2018-08-09 NOTE — Progress Notes (Addendum)
Per [place] admissions coordinator at [facility] SNF authorization is still pending. Patient's daughters Shauna Hugh and Santiago Glad are aware of above.   [employer], LCSW [phone]

## 2018-08-09 NOTE — Progress Notes (Signed)
Physical Therapy Treatment Patient Details Name: [name] MRN: [id number] DOB: [birth year] Today's Date: 08/09/2018    History of Present Illness Pt is an 84 y.o. female presenting to hospital 08/05/18 with N/V and dull lower abdominal pain; also mechanical fall Sunday transferring car to w/c.  Pt admitted with N/V, rib fx's (multiple acute to subacute R lower rib fx's segmental and displaced), T10 fx (subacute oblique fx through T10 vertebral body; also old compression fx T12 and L1), disorientation, and memory loss.  Neurosurgery recommending TLSO brace.  PMH includes 6.8 cm umbilical hernia, home O2 2 L at night, anemia, a-fib, htn, OSA on CPAP, B TKA, overactive bladder, PSVT, PE, pulmonary htn, L LE anterior and posterior wounds.    PT Comments    Pt agreeable to PT; reports 3/10 pain in back. Pt/family note back brace seems ill fitting; brace has risen upward with bed positioning/sliding down in bed. Pt requires Max A for bed mobility and initially to maintain sit due to strong posterior lean. Pt does improve with cues and assist to maintaining sit with Min A. TLSO brace re adjusted for improved fit. STS and stand pivot transfers with Max A x 2; pt has difficulty using LLE due to pain/result of fall. Continued re adjustment of brace once pt in chair for correct fit. Continue PT to progress strength, endurance and balance to improve all functional mobility.   Follow Up Recommendations  SNF     Equipment Recommendations  Rolling walker with 5" wheels;Wheelchair (measurements PT);Wheelchair cushion (measurements PT);3in1 (PT)    Recommendations for Other Services       Precautions / Restrictions Precautions Precautions: Fall;Back Required Braces or Orthoses: Spinal Brace Spinal Brace: Thoracolumbosacral orthotic Restrictions Weight Bearing Restrictions: No    Mobility  Bed Mobility Overal bed mobility: Needs Assistance Bed Mobility: Sidelying to Sit   Sidelying to sit: Max  assist       General bed mobility comments: posterior lean; strong  Transfers Overall transfer level: Needs assistance Equipment used: None(2 person assist) Transfers: Sit to/from [employer] Transfers Sit to Stand: +2 physical assistance;Max assist Stand pivot transfers: +2 physical assistance;Max assist       General transfer comment: pt has difficulty using LLE; 2 person A to keep pt in neutral/prevent posterior lean and protect back   Ambulation/Gait             General Gait Details: unable   Stairs             Wheelchair Mobility    Modified Rankin (Stroke Patients Only)       Balance Overall balance assessment: Needs assistance Sitting-balance support: Bilateral upper extremity supported;Feet supported Sitting balance-Leahy Scale: Poor Sitting balance - Comments: initially zero; improved with continued work/cues for forward lean to neutral and maintaining feet on floor   Standing balance support: During functional activity Standing balance-Leahy Scale: Zero                              Cognition Arousal/Alertness: Awake/alert Behavior During Therapy: WFL for tasks assessed/performed Overall Cognitive Status: Within Functional Limits for tasks assessed                                        Exercises      General Comments        Pertinent Vitals/Pain Pain  Assessment: 0-10 Pain Score: 3  Pain Location: back Pain Intervention(s): Limited activity within patient's tolerance;Monitored during session;Repositioned;Other (comment)(repositioned brace)    Home Living                      Prior Function            PT Goals (current goals can now be found in the care plan section) Progress towards PT goals: Progressing toward goals(slowly)    Frequency    7X/week      PT Plan Current plan remains appropriate    Co-evaluation              AM-PAC PT "6 Clicks" Daily Activity   Outcome Measure  Difficulty turning over in bed (including adjusting bedclothes, sheets and blankets)?: Unable Difficulty moving from lying on back to sitting on the side of the bed? : Unable Difficulty sitting down on and standing up from a chair with arms (e.g., wheelchair, bedside commode, etc,.)?: Unable Help needed moving to and from a bed to chair (including a wheelchair)?: Total Help needed walking in hospital room?: Total Help needed climbing 3-5 steps with a railing? : Total 6 Click Score: 6    End of Session   Activity Tolerance: Patient limited by fatigue;Other (comment)(weakness) Patient left: in chair;with call bell/phone within reach;with family/visitor present;Other (comment)(family request alarm remain off in chair)   PT Visit Diagnosis: Unsteadiness on feet (R26.81);Other abnormalities of gait and mobility (R26.89);Muscle weakness (generalized) (M62.81);History of falling (Z91.81);Difficulty in walking, not elsewhere classified (R26.2)     Time: 8768-1157 PT Time Calculation (min) (ACUTE ONLY): 25 min  Charges:  $Therapeutic Activity: 23-37 mins                      Larae Grooms, PTA 08/09/2018, 12:10 PM

## 2018-08-09 NOTE — Discharge Instructions (Addendum)
Follow-up with her doctor in 2 days.  Return to the emergency room if your vomiting blood or coffee grounds, if you have black or tarry stools, if your abdominal pain returns.  Take Pepcid as prescribed for 4 weeks.  Take Zofran as needed for nausea.  Follow-up with GI in a week.  Your CT also showed a thoracic vertebral body fracture which you probably sustained a near fall in August.  If you have back pain, numbness of your groin, weakness of your legs, or urinary or bowel loss please return to the emergency room immediately.  Wear your TLSO brace as instructed  Diet recommended at Discharge: Dysphagia level 3 (Mech Soft, moistened foods); NECTAR consistency liquids (straw ok). General aspiration precautions but MUST attempt to achieve an upright, head forward position for any po's/meals. Neck Brace should NEVER be on the neck. Pills WHOLE in Puree. Recommend Science writer Cup for dietary supplements of appropriate consistency for diet.

## 2018-08-09 NOTE — Progress Notes (Signed)
Per Tracie admissions coordinator at [facility] authorization is still pending. Patient's daughter Shauna Hugh is aware of above.   [employer], LCSW [phone]

## 2018-08-10 ENCOUNTER — Ambulatory Visit: Payer: Medicare Other | Admitting: Gastroenterology

## 2018-08-10 ENCOUNTER — Encounter: Payer: Self-pay | Admitting: Gastroenterology

## 2018-08-10 NOTE — Progress Notes (Addendum)
Per Tracie admissions coordinator at [facility] authorization is still pending. Patient's daughter Shauna Hugh is aware of above.    [employer], LCSW [phone]

## 2018-08-10 NOTE — Discharge Summary (Signed)
[facility] [place] NAME: [name]    MR#:  [id number]  DATE OF BIRTH:  Oct 12, 1934  DATE OF ADMISSION:  08/05/2018 ADMITTING PHYSICIAN: Arta Silence, MD  DATE OF DISCHARGE: 08/10/2018  PRIMARY CARE PHYSICIAN: Tower, Wynelle Fanny, MD    ADMISSION DIAGNOSIS:  PUD (peptic ulcer disease) [K27.9] Acute gastritis without hemorrhage, unspecified gastritis type [K29.00] Closed stable burst fracture of tenth thoracic vertebra, initial encounter ([place]) [S22.071A] Closed fracture of body of posterior thoracic vertebra, initial encounter ([place]) [X58.832P]  DISCHARGE DIAGNOSIS:  T10 close vertebral fracture-- conservative management. TLSO brace acute gastritis/duodenitis on CT scan Mild dysphagia--speech evaluation completed--follow recommendations SECONDARY DIAGNOSIS:   Past Medical History:  Diagnosis Date  . Anemia   . Arthritis    knee  . Diverticulosis   . Hx of colonic polyps 10/24/2014  . Hyperlipidemia   . Hypertension   . Osteopenia   . Overactive bladder    urge incontinence  . PSVT (paroxysmal supraventricular tachycardia) ([place])   . Pulmonary emboli ([place])   . Pulmonary hypertension ([place])   . Right ventricular failure ([place])   . Sleep apnea    CPAP  . Spinal stenosis     HOSPITAL COURSE:   [name] a 84 y.o.femalewith a history of anemia, diverticulosis,afib,hypertension, hyperlipidemia, OSA on CPAP hypothyroidism presents for evaluation of nausea and vomiting. Patient has had subjective fever, nausea,several episodes of nonbloody nonbilious emesis since last night and body aches. No diarrhea or constipation.  *T10 vertebral fracture. - per neurosurgery consult noted-- no surgical indication. Recommends use brace while ambulating. - Pain control. TLSO brace.  -Physical therapy consult appreciated.  * Gastritis/duodenitis on CT scan. now on famotidine  - Zofran PRN.  * Rib fractures-  stable  * Dementia  * pt had some issue with swallowing and was seen by speech therapist. \ Diet recommended at Discharge: Dysphagia level 3 (Mech Soft, moistened foods); NECTAR consistency liquids (straw ok). General aspiration precautions but MUST attempt to achieve an upright, head forward position for any po's/meals. Neck Brace should NEVER be on the neck. Pills WHOLE in Puree. Recommend Science writer Cup for dietary supplements of appropriate consistency for diet.  *Chronic left anterior knee and posterior leg wound. Appreciate wound care nurse consult. Continue dressing change per instruction. Currently on Keflex as outpatient patient follows in the wound care center  [place] working on discharge planning.  [employer] authorization done D/c today to [facility]  Patient will be followed by palliative care as outpatient. CONSULTS OBTAINED:  Treatment Team:  Arta Silence, MD  DRUG ALLERGIES:   Allergies  Allergen Reactions  . Prednisone Other (See Comments)    hyperctivity  . Zithromax [Azithromycin] Other (See Comments)    Prolonged QT on EKG  . Penicillins Hives and Swelling    Questionable allergy (?) Currently on cefazolin, tolerating well since 4/21 Patient is tolerating dicloxacillin without issue. No skin changes or other concerns.   . Sulfonamide Derivatives Hives  . Adhesive [Tape] Other (See Comments) and Rash    redness    DISCHARGE MEDICATIONS:   Allergies as of 08/10/2018      Reactions   Prednisone Other (See Comments)   hyperctivity   Zithromax [azithromycin] Other (See Comments)   Prolonged QT on EKG   Penicillins Hives, Swelling   Questionable allergy (?) Currently on cefazolin, tolerating well since 4/21 Patient is tolerating dicloxacillin without issue. No skin changes or other concerns.  Sulfonamide Derivatives Hives   Adhesive [tape] Other (See Comments), Rash   redness      Medication List    STOP taking these medications    ranitidine 150 MG tablet Commonly known as:  ZANTAC     TAKE these medications   ALPRAZolam 0.5 MG dissolvable tablet Commonly known as:  NIRAVAM Take 1 tablet (0.5 mg total) by mouth 2 (two) times daily as needed for anxiety. What changed:  Another medication with the same name was removed. Continue taking this medication, and follow the directions you see here.   benzonatate 200 MG capsule Commonly known as:  TESSALON Take 1 capsule (200 mg total) by mouth 2 (two) times daily as needed for cough.   CALCIUM 600/VITAMIN D3 600-800 MG-UNIT Tabs Generic drug:  Calcium Carb-Cholecalciferol Take 1 tablet by mouth 2 (two) times daily. What changed:  Another medication with the same name was removed. Continue taking this medication, and follow the directions you see here.   cephALEXin 500 MG capsule Commonly known as:  KEFLEX Take 1 capsule (500 mg total) by mouth every 8 (eight) hours. What changed:  when to take this   famotidine 20 MG tablet Commonly known as:  PEPCID Take 1 tablet (20 mg total) by mouth 2 (two) times daily. What changed:  when to take this   FLUoxetine 40 MG capsule Commonly known as:  PROZAC Take 1 capsule (40 mg total) by mouth daily.   hydrochlorothiazide 25 MG tablet Commonly known as:  HYDRODIURIL TAKE 1 TABLET DAILY   levothyroxine 50 MCG tablet Commonly known as:  SYNTHROID, LEVOTHROID Take 1 tablet (50 mcg total) by mouth daily.   Melatonin 5 MG Tabs Take 1 tablet by mouth at bedtime.   metoprolol tartrate 25 MG tablet Commonly known as:  LOPRESSOR Take 0.5 tablets (12.5 mg total) by mouth 2 (two) times daily.   naproxen sodium 220 MG tablet Commonly known as:  ALEVE Take 220 mg by mouth daily as needed (pain).   ondansetron 4 MG disintegrating tablet Commonly known as:  ZOFRAN-ODT Take 1 tablet (4 mg total) by mouth every 8 (eight) hours as needed for nausea or vomiting.   OXYGEN Inhale 2 L into the lungs at bedtime.   PRESERVISION  AREDS Caps Take 2 capsules by mouth daily.   SYSTANE 0.4-0.3 % Soln Generic drug:  Polyethyl Glycol-Propyl Glycol Apply 1 drop to eye 2 (two) times daily.   traMADol 50 MG tablet Commonly known as:  ULTRAM Take 1 tablet (50 mg total) by mouth every 8 (eight) hours as needed for moderate pain.   Vitamin B12-Folic Acid 503-546 MCG Tabs Take 1 tablet by mouth daily.   vitamin C 250 MG tablet Commonly known as:  ASCORBIC ACID Take 250 mg by mouth daily.   VITAMIN D-1000 MAX ST 1000 units tablet Generic drug:  Cholecalciferol Take 1,000 Units by mouth daily.       If you experience worsening of your admission symptoms, develop shortness of breath, life threatening emergency, suicidal or homicidal thoughts you must seek medical attention immediately by calling 911 or calling your MD immediately  if symptoms less severe.  You Must read complete instructions/literature along with all the possible adverse reactions/side effects for all the Medicines you take and that have been prescribed to you. Take any new Medicines after you have completely understood and accept all the possible adverse reactions/side effects.   Please note  You were cared for by a hospitalist during your hospital stay.  If you have any questions about your discharge medications or the care you received while you were in the hospital after you are discharged, you can call the unit and asked to speak with the hospitalist on call if the hospitalist that took care of you is not available. Once you are discharged, your primary care physician will handle any further medical issues. Please note that NO REFILLS for any discharge medications will be authorized once you are discharged, as it is imperative that you return to your primary care physician (or establish a relationship with a primary care physician if you do not have one) for your aftercare needs so that they can reassess your need for medications and monitor your lab  values. Today   SUBJECTIVE   Had some neck strain this am.  Daughter in the room  VITAL SIGNS:  Blood pressure 126/69, pulse 70, temperature 97.6 F (36.4 C), temperature source Oral, resp. rate 19, height 5\' 6"  (1.676 m), weight 92.5 kg, SpO2 94 %.  I/O:    Intake/Output Summary (Last 24 hours) at 08/10/2018 1231 Last data filed at 08/10/2018 0511 Gross per 24 hour  Intake 480 ml  Output 700 ml  Net -220 ml    PHYSICAL EXAMINATION:  GENERAL:  84 y.o.-year-old patient lying in the bed with no acute distress.  EYES: Pupils equal, round, reactive to light and accommodation. No scleral icterus. Extraocular muscles intact.  HEENT: Head atraumatic, normocephalic. Oropharynx and nasopharynx clear.  NECK:  Supple, no jugular venous distention. No thyroid enlargement, no tenderness.  LUNGS: Normal breath sounds bilaterally, no wheezing, rales,rhonchi or crepitation. No use of accessory muscles of respiration. TLSO brace + CARDIOVASCULAR: S1, S2 normal. No murmurs, rubs, or gallops.  ABDOMEN: Soft, non-tender, non-distended. Bowel sounds present. No organomegaly or mass.  EXTREMITIES: No pedal edema, cyanosis, or clubbing.  NEUROLOGIC: Cranial nerves II through XII are intact. Muscle strength 5/5 in all extremities. Sensation intact. Gait not checked.  PSYCHIATRIC: The patient is alert and oriented x 3.  SKIN: No obvious rash, lesion, or ulcer.   DATA REVIEW:   CBC  Recent Labs  Lab 08/05/18 1704  WBC 7.3  HGB 14.8  HCT 45.0  PLT 245    Chemistries  Recent Labs  Lab 08/05/18 1704  NA 136  K 3.6  CL 99  CO2 28  GLUCOSE 111*  BUN 19  CREATININE 0.79  CALCIUM 9.3  AST 22  ALT 13  ALKPHOS 61  BILITOT 1.0    Microbiology Results   No results found for this or any previous visit (from the past 240 hour(s)).  RADIOLOGY:  No results found.   Management plans discussed with the patient, family and they are in agreement.  CODE STATUS:     Code Status Orders   (From admission, onward)         Start     Ordered   08/06/18 1334  Do not attempt resuscitation (DNR)  Continuous    Question Answer Comment  In the event of cardiac or respiratory ARREST Do not call a "code blue"   In the event of cardiac or respiratory ARREST Do not perform Intubation, CPR, defibrillation or ACLS   In the event of cardiac or respiratory ARREST Use medication by any route, position, wound care, and other measures to relive pain and suffering. May use oxygen, suction and manual treatment of airway obstruction as needed for comfort.      08/06/18 1333  Code Status History    Date Active Date Inactive Code Status Order ID Comments User Context   08/06/2018 0432 08/06/2018 1333 Full Code [id number]  Arta Silence, MD Inpatient   03/02/2015 1840 03/03/2015 2006 DNR [id number]  Evans Lance, MD Inpatient   02/28/2015 1225 03/02/2015 1840 DNR [id number]  Troy Sine, MD Inpatient   02/25/2015 1153 02/28/2015 1225 DNR [id number]  Samella Parr, NP Inpatient   02/10/2015 1717 02/15/2015 2123 Full Code [id number]  Velvet Bathe, MD Inpatient   07/04/2014 1033 07/04/2014 1947 Full Code [id number]  Bensimhon, Shaune Pascal, MD Inpatient   03/14/2014 0218 03/16/2014 1644 DNR [id number]  Barton Dubois, MD ED   04/26/2013 0018 04/26/2013 1743 Full Code [id number]  Rise Patience, MD Inpatient   11/23/2012 0452 11/27/2012 1615 Full Code [id number]  Lorie Phenix, RN Inpatient   01/12/2012 1018 01/15/2012 1435 Full Code [id number]  Corum, Jeani Sow, RN Inpatient    Advance Directive Documentation     Most Recent Value  Type of Advance Directive  Healthcare Power of Attorney, Living will  Pre-existing out of facility DNR order (yellow form or pink MOST form)  -  "MOST" Form in Place?  -      TOTAL TIME TAKING CARE OF THIS PATIENT: *40* minutes.    Fritzi Mandes M.D on 08/10/2018 at 12:31 PM  Between 7am to 6pm - Pager - [phone] After 6pm go to [email or web address] - password [email or web address]  [facility]  Office  [phone]  CC: Primary care physician; Tower, Wynelle Fanny, MD

## 2018-08-10 NOTE — Progress Notes (Signed)
Physical Therapy Treatment Patient Details Name: [name] MRN: [id number] DOB: Feb 07, 1934 Today's Date: 08/10/2018    History of Present Illness Pt is an 84 y.o. female presenting to hospital 08/05/18 with N/V and dull lower abdominal pain; also mechanical fall Sunday transferring car to w/c.  Pt admitted with N/V, rib fx's (multiple acute to subacute R lower rib fx's segmental and displaced), T10 fx (subacute oblique fx through T10 vertebral body; also old compression fx T12 and L1), disorientation, and memory loss.  Neurosurgery recommending TLSO brace.  PMH includes 6.8 cm umbilical hernia, home O2 2 L at night, anemia, a-fib, htn, OSA on CPAP, B TKA, overactive bladder, PSVT, PE, pulmonary htn, L LE anterior and posterior wounds.    PT Comments    Pt in bed, visibly uncomfortable.  Reports head/neck pain.  Brace had moved up and top anterior part of brace hitting chin.  Agreed to sitting EOB in attempts to get up to chair and reposition brace for comfort.  Attempted to sit but pt with increased pain and resistance to movement.  After multiple attempts and encouragement, primary RN called to check pt.  She had received pain medication prior per family.  MD and RN.  IV pain medication given and bed was decreased to 30 degrees and TSLO brace was removed for comfort.  Pt has been keeping brace on at all times which may be attributing to neck pain which at this time is felt to be muscular by MD.  Pt seemed to be a bit more comfortable with it removed and at 30 degrees and repositioned in bed.  She is unable to tolerate further therapy at this time.   Follow Up Recommendations  SNF     Equipment Recommendations  Rolling walker with 5" wheels;Wheelchair (measurements PT);Wheelchair cushion (measurements PT);3in1 (PT)    Recommendations for Other Services       Precautions / Restrictions Precautions Precautions: Fall;Back Precaution Comments: L knee and posterior L LE wounds Required  Braces or Orthoses: Spinal Brace Spinal Brace: Thoracolumbosacral orthotic Restrictions Weight Bearing Restrictions: No Other Position/Activity Restrictions: Per MD, TSLO on when HOB > 30 degrees    Mobility  Bed Mobility Overal bed mobility: Needs Assistance Bed Mobility: Rolling Rolling: Max assist            Transfers                 General transfer comment: unable due to pain  Ambulation/Gait             General Gait Details: unable due to pain   Stairs             Wheelchair Mobility    Modified Rankin (Stroke Patients Only)       Balance                                            Cognition Arousal/Alertness: Awake/alert Behavior During Therapy: WFL for tasks assessed/performed Overall Cognitive Status: Within Functional Limits for tasks assessed                                        Exercises      General Comments        Pertinent Vitals/Pain Pain Assessment: Faces Faces Pain Scale: Hurts worst  Pain Location: neck/head Pain Descriptors / Indicators: Crying;Constant;Grimacing;Guarding;Headache Pain Intervention(s): Premedicated before session;RN gave pain meds during session;Patient requesting pain meds-RN notified;Repositioned    Home Living                      Prior Function            PT Goals (current goals can now be found in the care plan section) Progress towards PT goals: Not progressing toward goals - comment    Frequency    7X/week      PT Plan Current plan remains appropriate    Co-evaluation              AM-PAC PT "6 Clicks" Daily Activity  Outcome Measure  Difficulty turning over in bed (including adjusting bedclothes, sheets and blankets)?: Unable Difficulty moving from lying on back to sitting on the side of the bed? : Unable Difficulty sitting down on and standing up from a chair with arms (e.g., wheelchair, bedside commode, etc,.)?:  Unable Help needed moving to and from a bed to chair (including a wheelchair)?: Total Help needed walking in hospital room?: Total Help needed climbing 3-5 steps with a railing? : Total 6 Click Score: 6    End of Session   Activity Tolerance: Patient limited by pain Patient left: in bed;with call bell/phone within reach;with bed alarm set Nurse Communication: Patient requests pain meds       Time: 8786-7672 PT Time Calculation (min) (ACUTE ONLY): 20 min  Charges:  $Therapeutic Activity: 8-22 mins                     Chesley Noon, PTA 08/10/18, 9:06 AM

## 2018-08-10 NOTE — Progress Notes (Signed)
Patient is medically stable for D/C to [facility] today. Per Tracie admissions coordinator at [facility] authorization has been received and patient can come today to room 601. RN will call report and arrange EMS for transport. Clinical Education officer, museum (CSW) sent D/C orders to [facility] via [place]. Patient is aware of above. Patient's daughter Shauna Hugh is at bedside and aware of above. Please reconsult if future social work needs arise. CSW signing off.   [employer], LCSW [phone]

## 2018-08-10 NOTE — Evaluation (Signed)
Clinical/Bedside Swallow Evaluation Patient Details  Name: [name] MRN: [id number] Date of Birth: 1933/12/01  Today's Date: 08/10/2018 Time: SLP Start Time (ACUTE ONLY): 58 SLP Stop Time (ACUTE ONLY): 1035 SLP Time Calculation (min) (ACUTE ONLY): 60 min  Past Medical History:  Past Medical History:  Diagnosis Date  . Anemia   . Arthritis    knee  . Diverticulosis   . Hx of colonic polyps 10/24/2014  . Hyperlipidemia   . Hypertension   . Osteopenia   . Overactive bladder    urge incontinence  . PSVT (paroxysmal supraventricular tachycardia) ([place])   . Pulmonary emboli ([place])   . Pulmonary hypertension ([place])   . Right ventricular failure ([place])   . Sleep apnea    CPAP  . Spinal stenosis    Past Surgical History:  Past Surgical History:  Procedure Laterality Date  . BLADDER SUSPENSION  1993  . CARDIAC CATHETERIZATION N/A 02/28/2015   Procedure: Right/Left Heart Cath and Coronary Angiography;  Surgeon: Troy Sine, MD;  Location: [facility];  Service: Cardiovascular;  Laterality: N/A;  . CATARACT EXTRACTION Bilateral 2008  . CHOLECYSTECTOMY    . COLONOSCOPY    . COLONOSCOPY N/A 10/24/2014   Procedure: COLONOSCOPY;  Surgeon: Gatha Mayer, MD;  Location: [facility];  Service: Endoscopy;  Laterality: N/A;  . CYST REMOVAL LEG Left 02/15/2016   cyst removal from back of left knee  . ELECTROPHYSIOLOGIC STUDY N/A 03/02/2015   Procedure: SVT Ablation;  Surgeon: Evans Lance, MD;  Location: [facility] CV LAB;  Service: Cardiovascular;  Laterality: N/A;  . INCISIONAL HERNIA REPAIR    . RIGHT HEART CATHETERIZATION N/A 07/04/2014   Procedure: RIGHT HEART CATH;  Surgeon: Jolaine Artist, MD;  Location: [facility] CATH LAB;  Service: Cardiovascular;  Laterality: N/A;  . TONSILLECTOMY AND ADENOIDECTOMY    . TOTAL KNEE ARTHROPLASTY Left 2000  . TOTAL KNEE ARTHROPLASTY Left 10/29/2012   at [facility]  . TOTAL KNEE ARTHROPLASTY Right 03/09/2014  . TOTAL KNEE REVISION  01/12/2012    Procedure: TOTAL KNEE REVISION;  Surgeon: Kerin Salen, MD;  Location: [facility];  Service: Orthopedics;  Laterality: Left;  DEPUY/ LCS , HAND SET  . TRIGGER FINGER RELEASE  01/12/2012   Procedure: RELEASE TRIGGER FINGER/A-1 PULLEY;  Surgeon: Kerin Salen, MD;  Location: [facility];  Service: Orthopedics;  Laterality: Right;   HPI:  Pt is an 84 y.o. female presenting to hospital 08/05/18 with N/V and dull lower abdominal pain; also mechanical fall Sunday transferring car to w/c.  Pt admitted with N/V, rib fx's (multiple acute to subacute R lower rib fx's segmental and displaced), T10 fx (subacute oblique fx through T10 vertebral body; also old compression fx T12 and L1), disorientation, and Memory loss - MD suspects could be Dementia.  Neurosurgery recommending TLSO brace.  PMH includes 6.8 cm umbilical hernia, home O2 2 L at night, anemia, a-fib, htn, OSA on CPAP, B TKA, overactive bladder, PSVT, PE, pulmonary htn, L LE anterior and posterior wounds, Memory Loss w/ suspected Dementia (per MD note - pt is to see Neurology for f/u).  Pt is requiring Pain medication d/t discomfort and is often Labile especially around family.    Assessment / Plan / Recommendation Clinical Impression  Pt appears to present w/ Mild+ oropharyngeal phase swallowing deficits w/ reported pharyngeal phase deficits moreso w/ thin liquids at home much prior to this admission(per Dtr). Pt's currently swallowing status is also impacted by pt's slightly reclined  position sec. to back brace and discomfort from thoracic fx/compression issues - see Orthopedic notes. Also note Cognitive decline w/ pt requiring Mod verbal cues/direction to participate in tasks during the swallowing evaluation. As per chart note, pt has memory loss and requires more support for follow through. She appears easily Labile w/ family members. Also of note, pt also has a reported h/o difficulty swallowing thin liquids w/ overt coughing noted intermittently "over the  years", per Dtr. Pt was positioned most midline and upright as was comfortable for pt - 75+ degrees w/ head supported by a towel roll. Pt was then given single ice chip trials, Nectar liquids by cup/straw, purees, and mech soft foods. Pt appeared to tolerate all of these consistency trials w/ no overt s/s of aspiration noted; clear vocal quality b/t trials and no decline in respiratory status post trials. Oral phase was grossly [facility] w/ all consistencies except w/ increased textured trials of mech soft in which she exhibited min increased bolus management time for mastication effort and attention. Given time, and alternating w/ moist food or Nectar liquid, pt cleared appropriately. When asked she was not interested in eating but was recommended to order an early lunch meal d/t having the brace in place and sitting up in the bed - much effort on pt's part. OM skills appeared [facility] w/ no unilateral weakness noted. Pt required help feeding herself.  Recommend a Dysphagia level 3 (Mech soft) diet w/ NECTAR consistency liquids; aspiration precautions; Pills in Puree - Crushed in ice cream(?) for easier swallowing. Recommend feeding support at all meals for encouragement and safety in positioning/placement of back brace. Recommend Dietician f/u for nutritional supplements. Discussed diet consistency and foods/drinks naturally of this diet consistency for pt to have for safety and pleasure. ST services will f/u w/ toleration of diet and trials to upgrade (liquids) diet when pt is able to better participate in the therapy comfortably - positioning w/ the back brace, sit fully forward, calmer Cognitive attention to task.  Discussed w/ MD/NSG and family who agreed.  SLP Visit Diagnosis: Dysphagia, oropharyngeal phase (R13.12)    Aspiration Risk  Mild aspiration risk;Risk for inadequate nutrition/hydration    Diet Recommendation  Dysphagia level 3 w/ moistened foods; NECTAR consistency liquids. Aspiration precautions  including upright positioning w/ head forward w/ back supported.  Feeding support at meals d/t confusion and overall weakness.  Medication Administration: Crushed with puree(for safer swallowing)    Other  Recommendations Recommended Consults: (Neurology for full Cognitive assessment; Dietitian) Oral Care Recommendations: Oral care BID;Staff/trained caregiver to provide oral care Other Recommendations: Order thickener from pharmacy;Prohibited food (jello, ice cream, thin soups);Remove water pitcher;Have oral suction available   Follow up Recommendations Skilled Nursing facility      Frequency and Duration min 2x/week(when better able to participate d/t back discomfort)  1 week       Prognosis Prognosis for Safe Diet Advancement: Fair Barriers to Reach Goals: Cognitive deficits;Behavior(Back discomfort)      Swallow Study   General Date of Onset: 08/05/18 HPI: Pt is an 84 y.o. female presenting to hospital 08/05/18 with N/V and dull lower abdominal pain; also mechanical fall Sunday transferring car to w/c.  Pt admitted with N/V, rib fx's (multiple acute to subacute R lower rib fx's segmental and displaced), T10 fx (subacute oblique fx through T10 vertebral body; also old compression fx T12 and L1), disorientation, and Memory loss - MD suspects could be Dementia.  Neurosurgery recommending TLSO brace.  PMH includes 6.8 cm  umbilical hernia, home O2 2 L at night, anemia, a-fib, htn, OSA on CPAP, B TKA, overactive bladder, PSVT, PE, pulmonary htn, L LE anterior and posterior wounds, Memory Loss w/ suspected Dementia (per MD note - pt is to see Neurology for f/u).  Pt is requiring Pain medication d/t discomfort and is often Labile especially around family.  Type of Study: Bedside Swallow Evaluation Previous Swallow Assessment: none reported - though Dtr reported pt has long standing h/o getting easily choked w/ coughing when drinking thin liquids (at home) for "many years now" Diet Prior to this  Study: (diet canceled this morning by NSG; regular at home) Temperature Spikes Noted: No(wbc 7.3) Respiratory Status: Room air History of Recent Intubation: No Behavior/Cognition: Alert;Cooperative Oral Cavity Assessment: Dry Oral Care Completed by SLP: Recent completion by staff Oral Cavity - Dentition: Dentures, top(native bottom dentition) Vision: (n/a) Self-Feeding Abilities: Needs assist;Needs set up;Total assist Patient Positioning: Postural control adequate for testing(head supported forward w/ towel roll) Baseline Vocal Quality: Normal Volitional Cough: Cognitively unable to elicit Volitional Swallow: Unable to elicit    Oral/Motor/Sensory Function Overall Oral Motor/Sensory Function: Within functional limits(appropriate lingual/labial movements w/ boluses)   Ice Chips Ice chips: Within functional limits Presentation: Spoon(fed; 2 trials)   Thin Liquid Thin Liquid: Not tested Other Comments: d/t pt's slightly reclined position sec. to back brace; discomfort. Also note Cognitive decline as per chart note; currently, pt requires mod+ cues for follow through. Pt also has a reported h/o difficulty managing thin liquids w/ overt coughing noted intermittently per Dtr    Nectar Thick Nectar Thick Liquid: Within functional limits Presentation: Spoon;Straw(fed; consumed ~2-3 ozs total)   Honey Thick Honey Thick Liquid: Not tested   Puree Puree: Within functional limits Presentation: Spoon(fed; 6 trials)   Solid     Solid: Impaired Presentation: Spoon(fed; 3 trials of mech soft moistened w/ ice cream) Oral Phase Impairments: Impaired mastication(min increased time/effort/attention) Oral Phase Functional Implications: Impaired mastication(min increased time/effort/attention) Pharyngeal Phase Impairments: (none)      Orinda Kenner, MS, CCC-SLP Karee Christopherson 08/10/2018,2:49 PM

## 2018-08-10 NOTE — Clinical Social Work Placement (Signed)
   CLINICAL SOCIAL WORK PLACEMENT  NOTE  Date:  08/10/2018  Patient Details  Name: [name] MRN: [id number] Date of Birth: [birth year]  Clinical Social Work is seeking post-discharge placement for this patient at the [place] level of care (*CSW will initial, date and re-position this form in  chart as items are completed):  Yes   Patient/family provided with [place] Work Department's list of facilities offering this level of care within the geographic area requested by the patient (or if unable, by the patient's family).  Yes   Patient/family informed of their freedom to choose among providers that offer the needed level of care, that participate in Medicare, Medicaid or managed care program needed by the patient, have an available bed and are willing to accept the patient.  Yes   Patient/family informed of [facility]'s ownership interest in [facility] and [facility], as well as of the fact that they are under no obligation to receive care at these facilities.  PASRR submitted to EDS on       PASRR number received on       Existing PASRR number confirmed on 08/06/18     FL2 transmitted to all facilities in geographic area requested by pt/family on 08/06/18     FL2 transmitted to all facilities within larger geographic area on       Patient informed that his/her managed care company has contracts with or will negotiate with certain facilities, including the following:        Yes   Patient/family informed of bed offers received.  Patient chooses bed at [facility] )     Physician recommends and patient chooses bed at      Patient to be transferred to [facility] ) on 08/10/18.  Patient to be transferred to facility by [facility] )     Patient family notified on 08/10/18 of transfer.  Name of family member notified:  (Patient's daughter Shauna Hugh is aware of D/C today. )     PHYSICIAN       Additional Comment:      _______________________________________________ Joycie Aerts, [name], LCSW 08/10/2018, 1:41 PM

## 2018-08-11 ENCOUNTER — Ambulatory Visit: Payer: Medicare Other | Admitting: Internal Medicine

## 2018-08-11 ENCOUNTER — Inpatient Hospital Stay: Payer: Medicare Other | Admitting: Family Medicine

## 2018-08-26 ENCOUNTER — Non-Acute Institutional Stay: Payer: Medicare Other | Admitting: Primary Care

## 2018-08-26 DIAGNOSIS — I2781 Cor pulmonale (chronic): Secondary | ICD-10-CM

## 2018-08-26 DIAGNOSIS — Z7409 Other reduced mobility: Secondary | ICD-10-CM

## 2018-08-26 DIAGNOSIS — Z8781 Personal history of (healed) traumatic fracture: Secondary | ICD-10-CM

## 2018-08-26 DIAGNOSIS — M170 Bilateral primary osteoarthritis of knee: Secondary | ICD-10-CM

## 2018-08-26 DIAGNOSIS — Z515 Encounter for palliative care: Secondary | ICD-10-CM

## 2018-08-26 NOTE — Progress Notes (Signed)
PALLIATIVE CARE CONSULT VISIT   [name] DOB: Dec 25, 1933 MRN: [id number]  PRIMARY CARE PROVIDER:   Abner Greenspan, MD  REFERRING PROVIDER:  Abner Greenspan, MD [place], [place] [address]  RESPONSIBLE PARTY:    [name]  ASSESSMENT and RECOMMENDATIONS 1.  Functionality: PT at SNF report is patient is improving with ambulation but needs assist of 1 person. Also needs help with most adls and all iadls.  2. Goals of care: Her [name] and POA is in the hospital with an infection. Patient is scheduled to return home on Monday. POA's husband said they will have home health and do the best they can. Palliative to continue to follow and provide support, clarification for goals of care. Patient was hospitalized before f/u from initial assessment RE DNR and MOST directives.  Return 1 month.  I spent 25 minutes providing this consultation,  from 10:00 to 10:25. More than 50% of the time in this consultation was spent coordinating communication.   HISTORY OF PRESENT ILLNESS:  [name] is a 84 y.o. year old female with multiple medical problems including dementia, frequent  falls,fractures.,Pulmonary htn, DJD. Palliative Care was asked to help address goals of care.   CODE STATUS: FC  PPS: 40% HOSPICE ELIGIBILITY/DIAGNOSIS: no  PAST MEDICAL HISTORY:  Past Medical History:  Diagnosis Date  . Anemia   . Arthritis    knee  . Diverticulosis   . Hx of colonic polyps 10/24/2014  . Hyperlipidemia   . Hypertension   . Osteopenia   . Overactive bladder    urge incontinence  . PSVT (paroxysmal supraventricular tachycardia) ([place])   . Pulmonary emboli ([place])   . Pulmonary hypertension ([place])   . Right ventricular failure ([place])   . Sleep apnea    CPAP  . Spinal stenosis     SOCIAL HX:  Social History   Tobacco Use  . Smoking status: Never Smoker  . Smokeless tobacco: Never Used  Substance Use Topics  . Alcohol use: No    Alcohol/week:  0.0 standard drinks    ALLERGIES:  Allergies  Allergen Reactions  . Prednisone Other (See Comments)    hyperctivity  . Zithromax [Azithromycin] Other (See Comments)    Prolonged QT on EKG  . Penicillins Hives and Swelling    Questionable allergy (?) Currently on cefazolin, tolerating well since 4/21 Patient is tolerating dicloxacillin without issue. No skin changes or other concerns.   . Sulfonamide Derivatives Hives  . Adhesive [Tape] Other (See Comments) and Rash    redness     PERTINENT MEDICATIONS:  Outpatient Encounter Medications as of 08/26/2018  Medication Sig  . ALPRAZolam (NIRAVAM) 0.5 MG dissolvable tablet Take 1 tablet (0.5 mg total) by mouth 2 (two) times daily as needed for anxiety.  . benzonatate (TESSALON) 200 MG capsule Take 1 capsule (200 mg total) by mouth 2 (two) times daily as needed for cough.  . Calcium Carb-Cholecalciferol (CALCIUM 600/VITAMIN D3) 600-800 MG-UNIT TABS Take 1 tablet by mouth 2 (two) times daily.   . cephALEXin (KEFLEX) 500 MG capsule Take 1 capsule (500 mg total) by mouth every 8 (eight) hours.  . Cholecalciferol (VITAMIN D-1000 MAX ST) 1000 UNITS tablet Take 1,000 Units by mouth daily.  . Cobalamine Combinations (VITAMIN B12-FOLIC ACID) 427-062 MCG TABS Take 1 tablet by mouth daily.  . famotidine (PEPCID) 20 MG tablet Take 1 tablet (20 mg total) by mouth 2 (two) times daily.  Marland Kitchen FLUoxetine (  PROZAC) 40 MG capsule Take 1 capsule (40 mg total) by mouth daily.  . hydrochlorothiazide (HYDRODIURIL) 25 MG tablet TAKE 1 TABLET DAILY  . levothyroxine (SYNTHROID, LEVOTHROID) 50 MCG tablet Take 1 tablet (50 mcg total) by mouth daily.  . Melatonin 5 MG TABS Take 1 tablet by mouth at bedtime.  . metoprolol tartrate (LOPRESSOR) 25 MG tablet Take 0.5 tablets (12.5 mg total) by mouth 2 (two) times daily.  . Multiple Vitamins-Minerals (PRESERVISION AREDS) CAPS Take 2 capsules by mouth daily.  . naproxen sodium (ANAPROX) 220 MG tablet Take 220 mg by mouth daily  as needed (pain).   . ondansetron (ZOFRAN ODT) 4 MG disintegrating tablet Take 1 tablet (4 mg total) by mouth every 8 (eight) hours as needed for nausea or vomiting.  . OXYGEN Inhale 2 L into the lungs at bedtime.  Vladimir Faster Glycol-Propyl Glycol (SYSTANE) 0.4-0.3 % SOLN Apply 1 drop to eye 2 (two) times daily.  . traMADol (ULTRAM) 50 MG tablet Take 1 tablet (50 mg total) by mouth every 8 (eight) hours as needed for moderate pain.  . vitamin C (ASCORBIC ACID) 250 MG tablet Take 250 mg by mouth daily.   No facility-administered encounter medications on file as of 08/26/2018.     PHYSICAL EXAM:  VS 98.1-68-18 117/76 Ox 95%  General: NAD, frail appearing, WNWD Cardiovascular: regular rate and rhythm, S1S2 Pulmonary: clear all fields, no cough Abdomen: soft, nontender, + bowel sounds, denies constipation. MSK/Extremities: no edema, no joint deformities, uses walker, w/c and back brace. Skin: no rashes, no lesions Neurological: Weakness but otherwise nonfocal. Oriented x 1-2. Knew [name] was ill with infection, which was corroborated.  Cyndia Skeeters, DNP, AGPCNP-BC

## 2018-08-29 NOTE — Progress Notes (Deleted)
Cardiology Office Note Date:  08/29/2018  Patient ID:  [name], [name] 17-Feb-1934, MRN [id number] PCP:  Abner Greenspan, MD  Electrophysiologist:  Dr. Rayann Heman  ***refresh   Chief Complaint: planned 2 mo EP f/u  History of Present Illness: [name] is a 84 y.o. female with history of HTN, HLD, PSVT (s/p ablation), OSA w/CPAP, h/o PE, DVT, p.HTN, h/o RV failure, hypothyroidism,   She comes in today to be seen for Dr. Rayann Heman.  Last seen by him Sept 2019, at that visit noted an ER visit w/palpitatios noting PACs and low dose BB started, was doing well, no changes were made. The patient had declined further CP evaluation, and suspected to have been GI in etiology by Dr. Pasty Arch.  She was recently discharged (08/10/18) after a stay for gastritis, a mechanical fall w/vertebral rib fractures (no intervention needed).  The H&P for this stay mentions hx of AFib not on a/c, there is hx of total knee replacement complicated by DVT and PE tx w/eliquis, in 2016 had pneumonia, infected knee prosthesis reportedly with an episode of AFib in that setting, though in EP f/u this was felt to be incorrect with no evidence of AF.  *** symptoms, palps, CP ?? *** CP w/u ?  >>> gatritis *** labs *** meds *** monitoring? *** hosp EKG *** CPAP?   Past Medical History:  Diagnosis Date  . Anemia   . Arthritis    knee  . Diverticulosis   . Hx of colonic polyps 10/24/2014  . Hyperlipidemia   . Hypertension   . Osteopenia   . Overactive bladder    urge incontinence  . PSVT (paroxysmal supraventricular tachycardia) ([place])   . Pulmonary emboli ([place])   . Pulmonary hypertension ([place])   . Right ventricular failure ([place])   . Sleep apnea    CPAP  . Spinal stenosis     Past Surgical History:  Procedure Laterality Date  . BLADDER SUSPENSION  1993  . CARDIAC CATHETERIZATION N/A 02/28/2015   Procedure: Right/Left Heart Cath and Coronary Angiography;  Surgeon: Troy Sine, MD;  Location: [facility] INVASIVE CV  LAB CUPID;  Service: Cardiovascular;  Laterality: N/A;  . CATARACT EXTRACTION Bilateral 2008  . CHOLECYSTECTOMY    . COLONOSCOPY    . COLONOSCOPY N/A 10/24/2014   Procedure: COLONOSCOPY;  Surgeon: Gatha Mayer, MD;  Location: [facility] ENDOSCOPY;  Service: Endoscopy;  Laterality: N/A;  . CYST REMOVAL LEG Left 02/15/2016   cyst removal from back of left knee  . ELECTROPHYSIOLOGIC STUDY N/A 03/02/2015   Procedure: SVT Ablation;  Surgeon: Evans Lance, MD;  Location: [facility] CV LAB;  Service: Cardiovascular;  Laterality: N/A;  . INCISIONAL HERNIA REPAIR    . RIGHT HEART CATHETERIZATION N/A 07/04/2014   Procedure: RIGHT HEART CATH;  Surgeon: Jolaine Artist, MD;  Location: [facility] CATH LAB;  Service: Cardiovascular;  Laterality: N/A;  . TONSILLECTOMY AND ADENOIDECTOMY    . TOTAL KNEE ARTHROPLASTY Left 2000  . TOTAL KNEE ARTHROPLASTY Left 10/29/2012   at [facility]  . TOTAL KNEE ARTHROPLASTY Right 03/09/2014  . TOTAL KNEE REVISION  01/12/2012   Procedure: TOTAL KNEE REVISION;  Surgeon: Kerin Salen, MD;  Location: [facility];  Service: Orthopedics;  Laterality: Left;  DEPUY/ LCS , HAND SET  . TRIGGER FINGER RELEASE  01/12/2012   Procedure: RELEASE TRIGGER FINGER/A-1 PULLEY;  Surgeon: Kerin Salen, MD;  Location: [facility];  Service: Orthopedics;  Laterality: Right;    Current Outpatient Medications  Medication Sig Dispense Refill  . ALPRAZolam (NIRAVAM) 0.5 MG dissolvable tablet Take 1 tablet (0.5 mg total) by mouth 2 (two) times daily as needed for anxiety. 5 tablet 0  . benzonatate (TESSALON) 200 MG capsule Take 1 capsule (200 mg total) by mouth 2 (two) times daily as needed for cough. 180 capsule 3  . Calcium Carb-Cholecalciferol (CALCIUM 600/VITAMIN D3) 600-800 MG-UNIT TABS Take 1 tablet by mouth 2 (two) times daily.     . cephALEXin (KEFLEX) 500 MG capsule Take 1 capsule (500 mg total) by mouth every 8 (eight) hours. 6 capsule 0  . Cholecalciferol (VITAMIN D-1000 MAX ST) 1000 UNITS tablet Take 1,000 Units by  mouth daily.    . Cobalamine Combinations (VITAMIN B12-FOLIC ACID) 782-956 MCG TABS Take 1 tablet by mouth daily.    . famotidine (PEPCID) 20 MG tablet Take 1 tablet (20 mg total) by mouth 2 (two) times daily. 60 tablet 1  . FLUoxetine (PROZAC) 40 MG capsule Take 1 capsule (40 mg total) by mouth daily. 30 capsule 11  . hydrochlorothiazide (HYDRODIURIL) 25 MG tablet TAKE 1 TABLET DAILY 90 tablet 3  . levothyroxine (SYNTHROID, LEVOTHROID) 50 MCG tablet Take 1 tablet (50 mcg total) by mouth daily. 90 tablet 3  . Melatonin 5 MG TABS Take 1 tablet by mouth at bedtime.    . metoprolol tartrate (LOPRESSOR) 25 MG tablet Take 0.5 tablets (12.5 mg total) by mouth 2 (two) times daily. 90 tablet 3  . Multiple Vitamins-Minerals (PRESERVISION AREDS) CAPS Take 2 capsules by mouth daily.    . naproxen sodium (ANAPROX) 220 MG tablet Take 220 mg by mouth daily as needed (pain).     . ondansetron (ZOFRAN ODT) 4 MG disintegrating tablet Take 1 tablet (4 mg total) by mouth every 8 (eight) hours as needed for nausea or vomiting. 20 tablet 0  . OXYGEN Inhale 2 L into the lungs at bedtime.    Vladimir Faster Glycol-Propyl Glycol (SYSTANE) 0.4-0.3 % SOLN Apply 1 drop to eye 2 (two) times daily.    . traMADol (ULTRAM) 50 MG tablet Take 1 tablet (50 mg total) by mouth every 8 (eight) hours as needed for moderate pain. 15 tablet 0  . vitamin C (ASCORBIC ACID) 250 MG tablet Take 250 mg by mouth daily.     No current facility-administered medications for this visit.     Allergies:   Prednisone; Zithromax [azithromycin]; Penicillins; Sulfonamide derivatives; and Adhesive [tape]   Social History:  The patient  reports that she has never smoked. She has never used smokeless tobacco. She reports that she does not drink alcohol or use drugs.   Family History:  The patient's family history includes Breast cancer (age of onset: 38) in her mother; Diabetes in her father; Heart disease in her father; Hypertension in her father; Uterine  cancer in her sister.***  ROS:  Please see the history of present illness. Otherwise, review of systems is positive for ***.   All other systems are reviewed and otherwise negative.   PHYSICAL EXAM: *** VS:  There were no vitals taken for this visit. BMI: There is no height or weight on file to calculate BMI. Well nourished, well developed, in no acute distress  HEENT: normocephalic, atraumatic  Neck: no JVD, carotid bruits or masses Cardiac:  normal S1, S2; RRR; no significant murmurs, no rubs, or gallops Lungs:  clear to auscultation bilaterally, no wheezing, rhonchi or rales  Abd: soft, nontender,  + BS MS: no deformity or atrophy Ext: no  edema  Skin: warm and dry, no rash Neuro:  No gross deficits appreciated Psych: euthymic mood, full affect  *** PPM/ICD site is stable, no tethering or discomfort   EKG:  Not done today  02/12/15: TTE Study Conclusions - Left ventricle: Paradoxical septal motiom. The cavity size was normal. Wall thickness was increased in a pattern of mild LVH. The estimated ejection fraction was 55%. - Mitral valve: There was mild regurgitation. - Right ventricle: The cavity size was severely dilated. Systolic function was moderately to severely reduced. - Tricuspid valve: There was moderate regurgitation. - Pulmonary arteries: PA peak pressure: 57 mm Hg (S). - Pericardium, extracardiac: A small pericardial effusion was identified posterior to the heart.  02/28/15: LHC  No significant coronary obstructive disease with mild proximal RCA ectasia.    Low normal global LV function with an ejection fraction of 50-55% without focal segmental wall motion abnormalities.  Mild to moderate pulmonary hypertension  03/02/15: EPS/Ablation CONCLUSIONS:  1. Sinus rhythm upon presentation.  2. The patient had dual AV nodal physiology with easily inducible classic AV nodal reentrant tachycardia, there were no other accessory pathways or arrhythmias induced   3. Successful radiofrequency modification of the slow AV nodal pathway  4. No inducible arrhythmias following ablation.  5. No early apparent complications.   Recent Labs: 07/06/2018: TSH 4.86 08/05/2018: ALT 13; BUN 19; Creatinine, Ser 0.79; Hemoglobin 14.8; Platelets 245; Potassium 3.6; Sodium 136  07/06/2018: Cholesterol 135; HDL 42.90; LDL Cholesterol 73; Total CHOL/HDL Ratio 3; Triglycerides 96.0; VLDL 19.2   CrCl cannot be calculated (Patient's most recent lab result is older than the maximum 21 days allowed.).   Wt Readings from Last 3 Encounters:  08/05/18 204 lb (92.5 kg)  06/30/18 209 lb (94.8 kg)  06/04/18 210 lb (95.3 kg)     Other studies reviewed: Additional studies/records reviewed today include: summarized above  ASSESSMENT AND PLAN:  1. Palpitations 2. 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 CP     ***  4. HTN     ***   Disposition: F/u with ***  Current medicines are reviewed at length with the patient today.  The patient did not have any concerns regarding medicines.***  Signed, Tommye Standard, PA-C 08/29/2018 10:15 AM     [facility] [place] [id number] [phone] (office)  [phone] (fax)

## 2018-08-31 ENCOUNTER — Telehealth: Payer: Self-pay | Admitting: Family Medicine

## 2018-08-31 ENCOUNTER — Ambulatory Visit: Payer: Medicare Other | Admitting: Physician Assistant

## 2018-08-31 NOTE — Telephone Encounter (Signed)
Stacey/Had just seen pt at home and is requesting approval for Physical Therapy  for 1x for week 1 and 2x's for week 2.   Pt is several depressed and family can't physically help the pt. Also need home aide also.    Erline Levine is also requesting a call back because she is very concerning about pt.

## 2018-08-31 NOTE — Telephone Encounter (Signed)
Thanks for the update  Please ok verbal orders  Also for home aide if possible  Looks like she has upcoming appt with me   If unable to care for pt at home- does family think she needs to be in SNF? (she just came from [facility] I think)  Also noted palliative care was consulted  Thanks

## 2018-09-01 ENCOUNTER — Telehealth: Payer: Self-pay

## 2018-09-01 ENCOUNTER — Telehealth: Payer: Self-pay | Admitting: *Deleted

## 2018-09-01 ENCOUNTER — Encounter

## 2018-09-01 ENCOUNTER — Ambulatory Visit: Payer: Medicare Other | Admitting: Diagnostic Neuroimaging

## 2018-09-01 DIAGNOSIS — R627 Adult failure to thrive: Secondary | ICD-10-CM | POA: Insufficient documentation

## 2018-09-01 MED ORDER — TRAMADOL HCL 50 MG PO TABS: 50.0000 mg | ORAL_TABLET | Freq: Three times a day (TID) | ORAL | 1 refills | Status: AC | PRN

## 2018-09-01 NOTE — Telephone Encounter (Signed)
Please ok the those orders including xray   I would like a hospice eval  Do they need a referral/order or if palliative care is seeing her can they initiate it ?

## 2018-09-01 NOTE — Telephone Encounter (Signed)
Verbal order given to [facility] for orders, I did advise her we received at call from Hospice saying that they are doing an eval. Becky Sax will also put order in for her xray

## 2018-09-01 NOTE — Telephone Encounter (Signed)
Amy/[facility] called stating that the pt is requesting Dr.Tower to be the attending provider for Hospice. Best cb for Amy is [phone]

## 2018-09-01 NOTE — Telephone Encounter (Signed)
That is fine  thanks

## 2018-09-01 NOTE — Telephone Encounter (Signed)
Stacy given the verbal order. She is very concern for pt she said pt is very depressed when pt got home from SNF she cried for over 2 hrs saying she was ready to die and stopped really eating or doing anything since. Pt's insurance is max out on SNF so she can't go back she is home. All pt wants to do is lay in her bed when PT Marzetta Board) came all she could do is get pt to sit up for a min and take a few sips of an ensure and then stand up for a second after that pt laid back down and that was it. Marzetta Board said pt's mind is "clear" she isn't confused or anything she's just severely depressed. Marzetta Board not even sure how they are going to get pt here for an appt because she is total assist it took all of Stacy's power to even get pt to sit up and stand up for a second.   Another issue that Marzetta Board wanted [place] to know about is that [facility] didn't tell them (PT) or [place] that pt fell on Sunday. The family is the one who told Marzetta Board that they received a call saying that pt fell on Sunday but she is fine. Marzetta Board thinks she injured her right thumb in the fall because she will not extend it or move it at all p[t says it hurts to much.   Marzetta Board is so concerned about the pt that she requested their nurse go out and check on pt today, she wanted Dr. [name] to be aware because she's not sure what to do for pt

## 2018-09-01 NOTE — Telephone Encounter (Signed)
Spoke to Rowan with [facility] who is requesting verbal orders for skilled nursing 2x/wk for 2wks and then 1/wk for 1wk with 2 prns. She also states pt fell while at [facility] and is unable to move her R thumb, and they are requesting orders for a portable xray, to confirm/ deny fracture.Pts family is also requesting a hospice eval; wanted to advise Dr Reginal Lutes they may not be able to bring her to her upcoming appt 11/11.  pls advise

## 2018-09-01 NOTE — Telephone Encounter (Signed)
Called Hosp[facility] back and advise them dr. [name] would be the attending provider for Hospice (also gave the verbal order for the eval on separate phone note)  Called pt's son and he advise me that [facility] [name] was going to get her alprazolam, they realized that [facility] wasn't giving it to her at all so he wants to try her on that 1st before adding anymore meds especially since they are doing a Hospice Eval now the Hospice nurse might change meds so he doesn't want to add anything else right now. Also pt didn't like the food at [facility] but now since being home she is starting to eat a little more, they are trying to give her foods they know she likes.

## 2018-09-01 NOTE — Telephone Encounter (Signed)
Notified [name].  Right now they have the regular pill and have a supply left over that is still good.  They will just crush these to give her for now as they are not extended release.  They do not need a new r/x at this time.

## 2018-09-01 NOTE — Telephone Encounter (Signed)
Alprazolam is written as a dissolving tablet - is that what they prefer or a regular pill  Let me know and I can send to the pharmacy

## 2018-09-01 NOTE — Telephone Encounter (Signed)
I sent it  Caution of sedation/fall risk and constipation with this

## 2018-09-01 NOTE — Telephone Encounter (Signed)
Pt's daughter called saying her mother has been taking tramadol 50mg  tid while at [facility]. She is home now and [employer] they have [facility] the tramad[facility] [place] not [facility]. Daughter is asking if Dr [name] will do the rx. Hospice is coming tomorrow. Please let daughter know.

## 2018-09-01 NOTE — Telephone Encounter (Signed)
Is palliative care seeing her at home or do they need a referral ?  If palliative care is seeing her -do they think she is hospice appropriate at this time?   I know they cannot get her in -use cold compress on thumb -keep me updated  I would like to add another anti depressant called remeron (mitrazapine)- if she has not tried it before  It helps sleep and appetite as well   Let me know if interested and I will see if safe with her other medicines   Thanks

## 2018-09-01 NOTE — Telephone Encounter (Signed)
Notified patient's daughter Shauna Hugh.

## 2018-09-06 ENCOUNTER — Ambulatory Visit: Payer: Medicare Other | Admitting: Family Medicine

## 2018-09-06 DIAGNOSIS — Z0289 Encounter for other administrative examinations: Secondary | ICD-10-CM

## 2018-09-28 ENCOUNTER — Telehealth: Payer: Self-pay | Admitting: Family Medicine

## 2018-09-28 MED ORDER — ALPRAZOLAM 1 MG PO TABS: 0.5000 mg | ORAL_TABLET | Freq: Two times a day (BID) | ORAL | 3 refills | Status: AC | PRN

## 2018-09-28 NOTE — Telephone Encounter (Signed)
Seth Bake with Hospice notified Rx was sent and Rx was for 1mg  with sig of 1/2 tab BID prn

## 2018-09-28 NOTE — Telephone Encounter (Signed)
Seth Bake @ [facility] and [facility] of [facility] call to get refill on xanax 0.5 mg  Pt has 6 pills left  [facility] [place]  Seth Bake phone # [phone]

## 2018-09-28 NOTE — Telephone Encounter (Signed)
I sent it  Please let family know I sent 1 mg tab -they will need to change dosing to 1/2 pill bid prn   Thanks

## 2018-09-28 NOTE — Telephone Encounter (Signed)
[name] said that pt has been getting the regular tablets and family has been crushing them up and putting them in food like applesauce or yogurt. FYI [facility] 0.5mg  of xanax was still on back order so not sure if you want to send in a different strength

## 2018-09-28 NOTE — Telephone Encounter (Signed)
The dissolving tablet is on her list-do they still want that or just a pill?  Let me know and I will refill

## 2018-10-27 DEATH — deceased

## 2019-07-11 ENCOUNTER — Ambulatory Visit: Payer: Medicare Other

## 2019-07-18 ENCOUNTER — Encounter: Payer: Medicare Other | Admitting: Family Medicine
